# Patient Record
Sex: Female | Born: 1940 | Race: White | Hispanic: No | Smoking: Former smoker
Health system: Southern US, Community
[De-identification: ages and names within clinical notes are randomized; demographics above are authoritative.]

## PROBLEM LIST (undated history)

## (undated) DIAGNOSIS — I471 Supraventricular tachycardia, unspecified: Secondary | ICD-10-CM

## (undated) DIAGNOSIS — I447 Left bundle-branch block, unspecified: Secondary | ICD-10-CM

## (undated) DIAGNOSIS — Z923 Personal history of irradiation: Secondary | ICD-10-CM

## (undated) DIAGNOSIS — K219 Gastro-esophageal reflux disease without esophagitis: Secondary | ICD-10-CM

## (undated) DIAGNOSIS — I10 Essential (primary) hypertension: Secondary | ICD-10-CM

## (undated) DIAGNOSIS — I493 Ventricular premature depolarization: Secondary | ICD-10-CM

## (undated) DIAGNOSIS — I219 Acute myocardial infarction, unspecified: Secondary | ICD-10-CM

## (undated) DIAGNOSIS — M1990 Unspecified osteoarthritis, unspecified site: Secondary | ICD-10-CM

## (undated) DIAGNOSIS — Z9889 Other specified postprocedural states: Secondary | ICD-10-CM

## (undated) DIAGNOSIS — M549 Dorsalgia, unspecified: Secondary | ICD-10-CM

## (undated) DIAGNOSIS — M51369 Other intervertebral disc degeneration, lumbar region without mention of lumbar back pain or lower extremity pain: Secondary | ICD-10-CM

## (undated) DIAGNOSIS — R42 Dizziness and giddiness: Secondary | ICD-10-CM

## (undated) DIAGNOSIS — M5136 Other intervertebral disc degeneration, lumbar region: Secondary | ICD-10-CM

## (undated) DIAGNOSIS — R112 Nausea with vomiting, unspecified: Secondary | ICD-10-CM

## (undated) DIAGNOSIS — I5042 Chronic combined systolic (congestive) and diastolic (congestive) heart failure: Secondary | ICD-10-CM

## (undated) DIAGNOSIS — Z98811 Dental restoration status: Secondary | ICD-10-CM

## (undated) DIAGNOSIS — L409 Psoriasis, unspecified: Secondary | ICD-10-CM

## (undated) DIAGNOSIS — I4819 Other persistent atrial fibrillation: Secondary | ICD-10-CM

## (undated) DIAGNOSIS — I255 Ischemic cardiomyopathy: Secondary | ICD-10-CM

## (undated) DIAGNOSIS — I272 Pulmonary hypertension, unspecified: Secondary | ICD-10-CM

## (undated) DIAGNOSIS — M069 Rheumatoid arthritis, unspecified: Secondary | ICD-10-CM

## (undated) DIAGNOSIS — E785 Hyperlipidemia, unspecified: Secondary | ICD-10-CM

## (undated) DIAGNOSIS — I2510 Atherosclerotic heart disease of native coronary artery without angina pectoris: Secondary | ICD-10-CM

## (undated) DIAGNOSIS — J45909 Unspecified asthma, uncomplicated: Secondary | ICD-10-CM

## (undated) DIAGNOSIS — C50919 Malignant neoplasm of unspecified site of unspecified female breast: Secondary | ICD-10-CM

## (undated) DIAGNOSIS — Z7901 Long term (current) use of anticoagulants: Secondary | ICD-10-CM

## (undated) HISTORY — DX: Supraventricular tachycardia, unspecified: I47.10

## (undated) HISTORY — PX: CHOLECYSTECTOMY: SHX55

## (undated) HISTORY — PX: BREAST EXCISIONAL BIOPSY: SUR124

## (undated) HISTORY — PX: CARDIAC CATHETERIZATION: SHX172

## (undated) HISTORY — PX: HERNIA REPAIR: SHX51

## (undated) HISTORY — DX: Essential (primary) hypertension: I10

## (undated) HISTORY — DX: Rheumatoid arthritis, unspecified: M06.9

## (undated) HISTORY — PX: KNEE ARTHROSCOPY: SUR90

## (undated) HISTORY — PX: TUBAL LIGATION: SHX77

## (undated) HISTORY — PX: ABDOMINAL HYSTERECTOMY: SHX81

## (undated) HISTORY — DX: Ventricular premature depolarization: I49.3

## (undated) HISTORY — PX: NECK SURGERY: SHX720

## (undated) HISTORY — PX: BREAST LUMPECTOMY: SHX2

## (undated) HISTORY — PX: OTHER SURGICAL HISTORY: SHX169

## (undated) HISTORY — PX: LUMBAR FUSION: SHX111

## (undated) HISTORY — PX: BREAST BIOPSY: SHX20

## (undated) HISTORY — DX: Gastro-esophageal reflux disease without esophagitis: K21.9

## (undated) HISTORY — PX: TOTAL HIP ARTHROPLASTY: SHX124

## (undated) HISTORY — PX: APPENDECTOMY: SHX54

## (undated) HISTORY — DX: Other persistent atrial fibrillation: I48.19

## (undated) HISTORY — DX: Acute myocardial infarction, unspecified: I21.9

## (undated) HISTORY — DX: Ischemic cardiomyopathy: I25.5

## (undated) HISTORY — DX: Supraventricular tachycardia: I47.1

## (undated) HISTORY — DX: Psoriasis, unspecified: L40.9

## (undated) HISTORY — PX: COLONOSCOPY: SHX174

## (undated) HISTORY — DX: Atherosclerotic heart disease of native coronary artery without angina pectoris: I25.10

## (undated) HISTORY — DX: Dorsalgia, unspecified: M54.9

## (undated) HISTORY — DX: Hyperlipidemia, unspecified: E78.5

## (undated) HISTORY — DX: Unspecified osteoarthritis, unspecified site: M19.90

## (undated) HISTORY — DX: Chronic combined systolic (congestive) and diastolic (congestive) heart failure: I50.42

## (undated) HISTORY — PX: TONSILLECTOMY: SUR1361

## (undated) HISTORY — PX: TOTAL KNEE ARTHROPLASTY: SHX125

## (undated) HISTORY — PX: FOOT FRACTURE SURGERY: SHX645

---

## 1988-05-27 HISTORY — PX: CARDIAC CATHETERIZATION: SHX172

## 1989-12-22 HISTORY — PX: BREAST BIOPSY: SHX20

## 1992-12-22 DIAGNOSIS — I219 Acute myocardial infarction, unspecified: Secondary | ICD-10-CM

## 1992-12-22 HISTORY — DX: Acute myocardial infarction, unspecified: I21.9

## 1992-12-22 HISTORY — PX: CORONARY ARTERY BYPASS GRAFT: SHX141

## 1993-03-08 DIAGNOSIS — Z951 Presence of aortocoronary bypass graft: Secondary | ICD-10-CM | POA: Insufficient documentation

## 1993-04-11 HISTORY — PX: CORONARY ARTERY BYPASS GRAFT: SHX141

## 2004-04-26 ENCOUNTER — Other Ambulatory Visit: Payer: Self-pay

## 2005-03-10 ENCOUNTER — Ambulatory Visit: Payer: Self-pay | Admitting: Anesthesiology

## 2005-03-20 ENCOUNTER — Ambulatory Visit: Payer: Self-pay | Admitting: Anesthesiology

## 2005-10-09 ENCOUNTER — Ambulatory Visit: Payer: Self-pay | Admitting: Family Medicine

## 2005-10-14 ENCOUNTER — Ambulatory Visit: Payer: Self-pay | Admitting: Anesthesiology

## 2005-11-03 ENCOUNTER — Ambulatory Visit: Payer: Self-pay | Admitting: Anesthesiology

## 2005-12-04 ENCOUNTER — Ambulatory Visit: Payer: Self-pay | Admitting: Allergy

## 2005-12-22 ENCOUNTER — Emergency Department: Payer: Self-pay | Admitting: Unknown Physician Specialty

## 2006-03-12 ENCOUNTER — Ambulatory Visit: Payer: Self-pay | Admitting: Anesthesiology

## 2006-05-28 ENCOUNTER — Ambulatory Visit: Payer: Self-pay | Admitting: Anesthesiology

## 2006-10-01 ENCOUNTER — Ambulatory Visit: Payer: Self-pay | Admitting: Anesthesiology

## 2006-10-21 ENCOUNTER — Ambulatory Visit: Payer: Self-pay | Admitting: Podiatry

## 2006-10-26 ENCOUNTER — Ambulatory Visit: Payer: Self-pay | Admitting: Podiatry

## 2006-11-26 ENCOUNTER — Emergency Department: Payer: Self-pay | Admitting: Emergency Medicine

## 2006-11-26 ENCOUNTER — Other Ambulatory Visit: Payer: Self-pay

## 2006-12-30 ENCOUNTER — Ambulatory Visit: Payer: Self-pay | Admitting: Anesthesiology

## 2007-02-01 ENCOUNTER — Ambulatory Visit: Payer: Self-pay | Admitting: Anesthesiology

## 2007-02-23 ENCOUNTER — Ambulatory Visit (HOSPITAL_COMMUNITY): Admission: RE | Admit: 2007-02-23 | Discharge: 2007-02-23 | Payer: Self-pay | Admitting: *Deleted

## 2007-04-21 ENCOUNTER — Ambulatory Visit: Payer: Self-pay | Admitting: Anesthesiology

## 2007-05-20 ENCOUNTER — Ambulatory Visit: Payer: Self-pay | Admitting: Anesthesiology

## 2007-06-01 ENCOUNTER — Ambulatory Visit: Payer: Self-pay | Admitting: Family Medicine

## 2007-06-02 ENCOUNTER — Ambulatory Visit: Payer: Self-pay | Admitting: Family Medicine

## 2007-07-13 ENCOUNTER — Ambulatory Visit: Payer: Self-pay | Admitting: Neurosurgery

## 2007-07-16 ENCOUNTER — Encounter: Payer: Self-pay | Admitting: Neurosurgery

## 2007-07-23 ENCOUNTER — Encounter: Payer: Self-pay | Admitting: Neurosurgery

## 2007-07-30 ENCOUNTER — Ambulatory Visit: Payer: Self-pay | Admitting: Anesthesiology

## 2007-08-25 ENCOUNTER — Encounter: Payer: Self-pay | Admitting: Neurosurgery

## 2007-09-08 ENCOUNTER — Ambulatory Visit: Payer: Self-pay | Admitting: Anesthesiology

## 2007-09-22 ENCOUNTER — Encounter: Payer: Self-pay | Admitting: Neurosurgery

## 2007-10-19 ENCOUNTER — Ambulatory Visit: Payer: Self-pay | Admitting: Anesthesiology

## 2007-10-29 ENCOUNTER — Inpatient Hospital Stay (HOSPITAL_COMMUNITY): Admission: RE | Admit: 2007-10-29 | Discharge: 2007-10-30 | Payer: Self-pay | Admitting: Neurosurgery

## 2007-11-30 ENCOUNTER — Encounter: Admission: RE | Admit: 2007-11-30 | Discharge: 2007-11-30 | Payer: Self-pay | Admitting: Neurosurgery

## 2007-12-23 DIAGNOSIS — C50912 Malignant neoplasm of unspecified site of left female breast: Secondary | ICD-10-CM

## 2007-12-23 DIAGNOSIS — C50919 Malignant neoplasm of unspecified site of unspecified female breast: Secondary | ICD-10-CM

## 2007-12-23 HISTORY — DX: Malignant neoplasm of unspecified site of left female breast: C50.912

## 2007-12-23 HISTORY — PX: BREAST LUMPECTOMY: SHX2

## 2007-12-23 HISTORY — DX: Malignant neoplasm of unspecified site of unspecified female breast: C50.919

## 2007-12-23 HISTORY — PX: BREAST EXCISIONAL BIOPSY: SUR124

## 2008-02-01 ENCOUNTER — Ambulatory Visit: Payer: Self-pay | Admitting: Neurosurgery

## 2008-04-10 ENCOUNTER — Ambulatory Visit: Payer: Self-pay | Admitting: Gastroenterology

## 2008-04-10 LAB — HM COLONOSCOPY

## 2008-04-13 ENCOUNTER — Ambulatory Visit: Payer: Self-pay | Admitting: Neurosurgery

## 2008-05-26 ENCOUNTER — Ambulatory Visit: Payer: Self-pay | Admitting: Unknown Physician Specialty

## 2008-08-16 ENCOUNTER — Ambulatory Visit: Payer: Self-pay | Admitting: Family Medicine

## 2008-08-30 ENCOUNTER — Ambulatory Visit: Payer: Self-pay | Admitting: Family Medicine

## 2008-09-08 ENCOUNTER — Ambulatory Visit: Payer: Self-pay | Admitting: Internal Medicine

## 2008-09-08 ENCOUNTER — Ambulatory Visit: Payer: Self-pay | Admitting: Surgery

## 2008-09-08 ENCOUNTER — Other Ambulatory Visit: Payer: Self-pay

## 2008-09-12 ENCOUNTER — Ambulatory Visit: Payer: Self-pay | Admitting: Surgery

## 2008-09-21 ENCOUNTER — Ambulatory Visit: Payer: Self-pay | Admitting: Internal Medicine

## 2008-10-02 ENCOUNTER — Ambulatory Visit: Payer: Self-pay | Admitting: Surgery

## 2008-10-22 ENCOUNTER — Ambulatory Visit: Payer: Self-pay | Admitting: Internal Medicine

## 2008-11-06 ENCOUNTER — Ambulatory Visit: Payer: Self-pay | Admitting: Neurosurgery

## 2008-11-09 ENCOUNTER — Ambulatory Visit: Payer: Self-pay | Admitting: Internal Medicine

## 2008-11-21 ENCOUNTER — Ambulatory Visit: Payer: Self-pay | Admitting: Internal Medicine

## 2008-11-24 ENCOUNTER — Ambulatory Visit: Payer: Self-pay | Admitting: Anesthesiology

## 2008-12-22 ENCOUNTER — Ambulatory Visit: Payer: Self-pay | Admitting: Internal Medicine

## 2009-01-12 ENCOUNTER — Ambulatory Visit: Payer: Self-pay | Admitting: Family Medicine

## 2009-01-22 ENCOUNTER — Ambulatory Visit: Payer: Self-pay | Admitting: Internal Medicine

## 2009-02-19 ENCOUNTER — Ambulatory Visit: Payer: Self-pay | Admitting: Internal Medicine

## 2009-03-22 ENCOUNTER — Ambulatory Visit: Payer: Self-pay | Admitting: Internal Medicine

## 2009-03-30 ENCOUNTER — Ambulatory Visit: Payer: Self-pay | Admitting: Internal Medicine

## 2009-04-21 ENCOUNTER — Ambulatory Visit: Payer: Self-pay | Admitting: Internal Medicine

## 2009-06-21 ENCOUNTER — Ambulatory Visit: Payer: Self-pay | Admitting: Internal Medicine

## 2009-07-02 ENCOUNTER — Ambulatory Visit: Payer: Self-pay | Admitting: Internal Medicine

## 2009-07-22 ENCOUNTER — Ambulatory Visit: Payer: Self-pay | Admitting: Internal Medicine

## 2009-07-30 ENCOUNTER — Ambulatory Visit: Payer: Self-pay | Admitting: Anesthesiology

## 2009-08-20 ENCOUNTER — Ambulatory Visit: Payer: Self-pay | Admitting: Internal Medicine

## 2009-08-22 ENCOUNTER — Ambulatory Visit: Payer: Self-pay | Admitting: Internal Medicine

## 2009-10-22 ENCOUNTER — Ambulatory Visit: Payer: Self-pay | Admitting: Internal Medicine

## 2009-10-30 ENCOUNTER — Ambulatory Visit: Payer: Self-pay | Admitting: Internal Medicine

## 2009-11-21 ENCOUNTER — Ambulatory Visit: Payer: Self-pay | Admitting: Internal Medicine

## 2009-12-03 ENCOUNTER — Ambulatory Visit: Payer: Self-pay | Admitting: Anesthesiology

## 2010-02-08 ENCOUNTER — Ambulatory Visit: Payer: Self-pay | Admitting: Anesthesiology

## 2010-02-19 ENCOUNTER — Ambulatory Visit: Payer: Self-pay | Admitting: Internal Medicine

## 2010-02-22 ENCOUNTER — Ambulatory Visit: Payer: Self-pay | Admitting: Internal Medicine

## 2010-03-22 ENCOUNTER — Ambulatory Visit: Payer: Self-pay | Admitting: Internal Medicine

## 2010-08-21 ENCOUNTER — Ambulatory Visit: Payer: Self-pay | Admitting: Internal Medicine

## 2010-08-22 ENCOUNTER — Ambulatory Visit: Payer: Self-pay | Admitting: Internal Medicine

## 2010-09-16 ENCOUNTER — Ambulatory Visit: Payer: Self-pay | Admitting: Internal Medicine

## 2010-09-18 ENCOUNTER — Ambulatory Visit: Payer: Self-pay

## 2010-09-21 ENCOUNTER — Ambulatory Visit: Payer: Self-pay | Admitting: Internal Medicine

## 2011-01-22 ENCOUNTER — Ambulatory Visit: Payer: Self-pay | Admitting: Internal Medicine

## 2011-02-20 ENCOUNTER — Ambulatory Visit: Payer: Self-pay | Admitting: Internal Medicine

## 2011-05-06 NOTE — Op Note (Signed)
Teresa [name], Teresa [name]               ACCOUNT NO.:  [id number]   MEDICAL RECORD NO.:  [id number]          PATIENT TYPE:  INP   LOCATION:  3172                         FACILITY:  [facility]   PHYSICIAN:  Kathaleen Maser. Pool, M.D.    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   POSTOPERATIVE DIAGNOSIS:  L3-4 and L4-5 grade 1 degenerative  spondylolisthesis with stenosis.   PROCEDURE NOTE:  L3-4 and L4-5 decompressive laminectomy with L3, L4 and  L5 foraminotomies, bilaterally, more than would be required for simple  interbody fusion alone.  L3-4 and L4-5 posterior lumbar interbody fusion  utilizing Tangent interbody allograft wedge, Telamon interbody PEEK cage  and local autografting.  L3 through L5 posterolateral arthrodesis  utilizing segmental pedicle screw sedation and local autograft.   SURGEON:  Kathaleen Maser. Pool, M.D.   ASSISTANT:  Donalee Citrin, M.D.   ANESTHESIA:  General endotracheal.   INDICATIONS:  Teresa [name] is a 70 year old female with history of severe  back and lower extremity pain as well as neurogenic claudication failing  conservative management.  Workup demonstrates evidence of marked  multilevel disk degeneration, worse at the L3-4 and L4-5 levels with  resultant general spondylolisthesis at both levels and significant  stenosis.  The patient counseled as to her options.  She decided proceed  with an L3-4 and L4-5 decompression and fusion with instrumentation in  hopes of relieving her symptoms.   OPERATIVE NOTE:  The patient was placed on operating table in supine  position.  Adequate level of anesthesia was achieved.  The patient was  positioned prone on the [name] frame, appropriately padded.  The  patient's lumbar region was prepped and draped sterilely.  A 10 blade  was used to make a linear  incision overlying the L2, 3, 4, and 5 levels.  This was carried down sharply in the midline.  A subperiosteal  dissection was then performed exposing the lamina and facet joints L3,  L4 and L5 as well as transverse processes of aforementioned levels.  Deep self-retaining was placed.  Intraoperative fluoroscopy was used.  Levels were confirmed.  Decompressive laminectomy was then performed  using Leksell rongeurs, Kerrison rongeurs, high-speed drill to remove  the entire lamina of L3 and the entire lamina of L4 and the superior  aspect of lamina at L5.  Inferior facetectomy was performed bilaterally  at L3 and L4.  Superior facetectomies were performed bilaterally at L4  and L5.  All bone was cleaned and used in later autograft.  Ligament  flavum was elevated and resected in piecemeal fashion using Kerrison  rongeurs.  Wide decompressive foraminotomies were then performed along  the course of exiting L3, 4 and 5 nerve roots.  Epidural venous plexus  was coagulated and cut,  starting first on the patient's left side,  thecal sac and nerve roots gently mobilized and retracted towards the  midline.  Disk space then incised with a 15 blade in  rectangular  fashion.  Wide disk space clean out was achieved using pituitary  rongeurs, upward angled pituitary rongeurs and Epstein curettes.  The  procedure was repeated on the contralateral side.  Disk space then  sequentially dilated up to 8 mm with an 8-mm distractor left on the  patient's left side.  Thecal sac and nerve root was protected on the  right side.  Disk space then reamed and cut with 8-mm Tangent  instruments.  Soft tissues were removed from the interspace.  A 8 x 26-  mm Telamon cage packed with morselized autograft, and Progenix putty was  then packed into place and recessed less than 1 mm from the posterior  cortical margin of L3.  Distraction was moved to the patient's left  side.  Thecal sac and nerve root was protected on the left  side.  Disk  space once again reamed and then cut using 8-mm instruments.  Soft  tissues was removed from the interspace.  Disk space further curettaged.  Morselized autograft was packed within the interspace.  An 8 x 26-mm  tangent wedge was then impacted into place and recessed approximately 1  mm from the posterior cortical margin of L3.  Procedure repeated at L4-5  again without complication, again using 8-mm implants.  The pedicles of  L3, 4 and 5 were then identified using surface landmarks and  intraoperative fluoroscopy.  Superficial bone around the pedicle was  then removed using the high-speed drill.  Each pedicle was then probed  using pedicle awl.  Each pedicle awl track was then probed and found to  be solidly within bone.  Each pedicle awl track was then tapped and a  5.25-mm screw tapper used.  Screw tap hole was probed and found to be  solid within bone.  The 6.75 x 45-mm radius screws placed bilaterally at  L3 and L4, 6.75 x 40-mm screws placed bilaterally at L5.  Transverse  processes at L3, 4 and 5 were then decorticated using high speed drill.  Morselized autograft was packed posterolaterally for later fusion.  Short segment titanium rod was then placed through the screw heads at  L3, 4 and 5.  Locking caps were then placed over screw heads.  Locking  caps were then engaged with construct under compression.  A transverse  connector was placed.  Final images revealed good position of bone  grafts and hardware and proper operative level and normal alignment of  the spine.  Wound was then irrigated with antibiotic solution.  Hemostasis was ensured.  Gelfoam was placed topically in the epidural  space.  A medium Hemovac drain was left in the epidural space.  Wound  was then closed in layers with Vicryl sutures.  Steri-Strips and sterile  dressing were applied.  There were no complications.  The patient  tolerated the procedure well, and she returned to recovery for   postoperative care.           ______________________________  Kathaleen Maser. Pool, M.D.     HAP/MEDQ  D:  10/29/2007  T:  10/30/2007  Job:  [id number]

## 2011-05-09 NOTE — Cardiovascular Report (Signed)
Teresa [name], Teresa [name]               ACCOUNT NO.:  [id number]   MEDICAL RECORD NO.:  [id number]          PATIENT TYPE:  OIB   LOCATION:  2899                         FACILITY:  [facility]   PHYSICIAN:  Darlin Priestly, MD  DATE OF BIRTH:  06-26-1941   DATE OF PROCEDURE:  02/23/2007  DATE OF DISCHARGE:                            CARDIAC CATHETERIZATION   PROCEDURES:  1. Left heart catheterization.  2. Coronary angiogram.  3. Left ventriculogram.  4. Left internal mammary angiography.   COMPLICATIONS:  None.   INDICATIONS:  Teresa [name] is a 70 year old female patient of Dr. Franne Forts in [place] as well as [name], ANP-C, with a history of  coronary bypass surgery at [facility] in [facility] consisting of a LIMA to the LAD.  She has had no cardiology followup since that time, was recently seen by  [name] with the complaint of an intermittent palpitations.  She did  undergo a Cardiolite stress test suggesting anterior apical ischemia  with a normal LV function.  She is now brought for a cardiac  catheterization to reevaluate her CAD and IMA.   DESCRIPTION OF OPERATION:  After obtaining informed consent, the patient  was brought to the cardiac cath lab.  The right and left groin was  shaved, An ECG monitor was established.  Using the modified Seldinger  technique, a #6 French arterial sheath in the right femoral artery.  A 6-  [place] diagnostic catheter was used to perform using diagnostic  angiography.   The left main is a large vessel with no significant disease.   The LAD is a large-sized vessel which coursed through the apex and gives  rise to two diagonal branches.  There is 90% ostial LAD lesion which  appears hazy.  There is a  atretic but patent LIMA which inserts into the mid portion of the LAD.  This retrograde fills with LAD injections as well as a very faint  competitive flow in the IMA with IMA injections.  Again, it appears  atretic but patent.  The remainder of the  LAD has no significant  disease.   The first and second diagonals are medium-sized vessels with no  significant disease.   The left coronary artery also gives rise to a small-to-medium-sized  ramus intermedius with no significant disease.   The left circumflex is a medium-sized vessel coursing to the AV groove  and gives rise to two obtuse marginal branches.  The AV circumflex has  no significant disease.   The first OM is a medium-sized vessel which bifurcates distally with no  significant disease.   The second OM is a small vessel with no significant disease.   The right coronary artery is a medium-sized vessel which is dominant and  gives rise to a PDA and posterolateral branch.  There is no significant  disease in the RCA, PDA, and posterolateral branch.   The left ventricle reveals a preserved EF at 60%.   IMA injection reveals atretic but patent IMA, inserts in the mid portion  of the LAD.   Hemodynamic systemic arterial pressure 137/70, LV systemic system  pressure of 137/9, LVEDP of 19.   CONCLUSIONS:  1. Significant one-vessel coronary artery disease.  2. Patent but atretic left internal mammary artery into the mid      portion of the left anterior descending.  There is a very faint      competitive flow into the distal internal mammary artery.  3. Normal LV systolic function.  4. Elevated left ventricular end diastolic pressure.      Darlin Priestly, MD  Electronically Signed     RHM/MEDQ  D:  02/23/2007  T:  02/23/2007  Job:  [id number]   cc:   Fuller Canada, ANP-C

## 2011-08-26 ENCOUNTER — Ambulatory Visit: Payer: Self-pay | Admitting: Internal Medicine

## 2011-08-29 ENCOUNTER — Ambulatory Visit: Payer: Self-pay | Admitting: Internal Medicine

## 2011-09-22 ENCOUNTER — Ambulatory Visit: Payer: Self-pay | Admitting: Internal Medicine

## 2011-09-30 LAB — BASIC METABOLIC PANEL
BUN: 14
CO2: 30
Calcium: 9.4
Chloride: 103
Creatinine, Ser: 0.71
GFR calc Af Amer: >60
GFR calc non Af Amer: >60
Glucose, Bld: 87
Potassium: 4.3
Sodium: 139

## 2011-09-30 LAB — DIFFERENTIAL
Basophils Absolute: 0
Basophils Relative: 0
Eosinophils Absolute: 0.1
Eosinophils Relative: 1
Lymphocytes Relative: 20
Lymphs Abs: 2.2
Monocytes Absolute: 0.9 — ABNORMAL HIGH
Monocytes Relative: 8
Neutro Abs: 7.5
Neutrophils Relative %: 70

## 2011-09-30 LAB — HEPATIC FUNCTION PANEL
ALT: 13
AST: 14
Albumin: 3.6
Alkaline Phosphatase: 81
Bilirubin, Direct: 0.1
Indirect Bilirubin: 0.6
Total Bilirubin: 0.7
Total Protein: 6.4

## 2011-09-30 LAB — CBC
HCT: 48.5 — ABNORMAL HIGH
Hemoglobin: 16.3 — ABNORMAL HIGH
MCHC: 33.5
MCV: 93.1
Platelets: 319
RBC: 5.22 — ABNORMAL HIGH
RDW: 13.6
WBC: 10.8 — ABNORMAL HIGH

## 2011-09-30 LAB — TYPE AND SCREEN
ABO/RH(D): A POS
Antibody Screen: NEGATIVE

## 2011-09-30 LAB — ABO/RH: ABO/RH(D): A POS

## 2011-12-10 ENCOUNTER — Ambulatory Visit: Payer: Self-pay | Admitting: Anesthesiology

## 2012-01-05 ENCOUNTER — Ambulatory Visit: Payer: Self-pay | Admitting: Anesthesiology

## 2012-02-09 ENCOUNTER — Ambulatory Visit: Payer: Self-pay | Admitting: General Practice

## 2012-02-09 LAB — CBC
HCT: 46 % (ref 35.0–47.0)
HGB: 15.3 g/dL (ref 12.0–16.0)
MCH: 30.3 pg (ref 26.0–34.0)
MCHC: 33.1 g/dL (ref 32.0–36.0)
MCV: 91 fL (ref 80–100)
Platelet: 221 10*3/uL (ref 150–440)
RBC: 5.04 10*6/uL (ref 3.80–5.20)
RDW: 14.3 % (ref 11.5–14.5)
WBC: 5.2 10*3/uL (ref 3.6–11.0)

## 2012-02-09 LAB — BASIC METABOLIC PANEL
Anion Gap: 6 — ABNORMAL LOW (ref 7–16)
BUN: 14 mg/dL (ref 7–18)
Calcium, Total: 9.1 mg/dL (ref 8.5–10.1)
Chloride: 106 mmol/L (ref 98–107)
Co2: 28 mmol/L (ref 21–32)
Creatinine: 0.81 mg/dL (ref 0.60–1.30)
EGFR (African American): >60
EGFR (Non-African Amer.): >60
Glucose: 136 mg/dL — ABNORMAL HIGH (ref 65–99)
Osmolality: 282 (ref 275–301)
Potassium: 3.8 mmol/L (ref 3.5–5.1)
Sodium: 140 mmol/L (ref 136–145)

## 2012-02-09 LAB — URINALYSIS, COMPLETE
Bilirubin,UR: NEGATIVE
Blood: NEGATIVE
Glucose,UR: NEGATIVE mg/dL (ref 0–75)
Nitrite: NEGATIVE
Ph: 5 (ref 4.5–8.0)
Protein: NEGATIVE
RBC,UR: 2 /HPF (ref 0–5)
Specific Gravity: 1.017 (ref 1.003–1.030)
Squamous Epithelial: 5
WBC UR: 6 /HPF (ref 0–5)

## 2012-02-09 LAB — SEDIMENTATION RATE: Erythrocyte Sed Rate: 1 mm/hr (ref 0–30)

## 2012-02-09 LAB — APTT: Activated PTT: 34.1 secs (ref 23.6–35.9)

## 2012-02-09 LAB — PROTIME-INR
INR: 1.1
Prothrombin Time: 14.3 secs (ref 11.5–14.7)

## 2012-02-09 LAB — MRSA PCR SCREENING

## 2012-02-10 LAB — URINE CULTURE

## 2012-02-25 ENCOUNTER — Inpatient Hospital Stay: Payer: Self-pay | Admitting: General Practice

## 2012-02-26 LAB — BASIC METABOLIC PANEL
Anion Gap: 9 (ref 7–16)
BUN: 14 mg/dL (ref 7–18)
Calcium, Total: 8 mg/dL — ABNORMAL LOW (ref 8.5–10.1)
Chloride: 106 mmol/L (ref 98–107)
Co2: 28 mmol/L (ref 21–32)
Creatinine: 0.72 mg/dL (ref 0.60–1.30)
EGFR (African American): >60
EGFR (Non-African Amer.): >60
Glucose: 132 mg/dL — ABNORMAL HIGH (ref 65–99)
Osmolality: 287 (ref 275–301)
Potassium: 4.9 mmol/L (ref 3.5–5.1)
Sodium: 143 mmol/L (ref 136–145)

## 2012-02-26 LAB — PLATELET COUNT: Platelet: 188 10*3/uL (ref 150–440)

## 2012-02-26 LAB — HEMOGLOBIN: HGB: 11.5 g/dL — ABNORMAL LOW (ref 12.0–16.0)

## 2012-02-27 LAB — PLATELET COUNT: Platelet: 153 10*3/uL (ref 150–440)

## 2012-02-27 LAB — BASIC METABOLIC PANEL
Anion Gap: 11 (ref 7–16)
BUN: 10 mg/dL (ref 7–18)
Calcium, Total: 7.6 mg/dL — ABNORMAL LOW (ref 8.5–10.1)
Chloride: 109 mmol/L — ABNORMAL HIGH (ref 98–107)
Co2: 27 mmol/L (ref 21–32)
Creatinine: 0.61 mg/dL (ref 0.60–1.30)
EGFR (African American): >60
EGFR (Non-African Amer.): >60
Glucose: 121 mg/dL — ABNORMAL HIGH (ref 65–99)
Osmolality: 293 (ref 275–301)
Potassium: 4 mmol/L (ref 3.5–5.1)
Sodium: 147 mmol/L — ABNORMAL HIGH (ref 136–145)

## 2012-02-27 LAB — PATHOLOGY REPORT

## 2012-02-27 LAB — HEMOGLOBIN: HGB: 9.8 g/dL — ABNORMAL LOW (ref 12.0–16.0)

## 2012-02-29 ENCOUNTER — Encounter: Payer: Self-pay | Admitting: Internal Medicine

## 2012-03-22 ENCOUNTER — Encounter: Payer: Self-pay | Admitting: Internal Medicine

## 2012-04-21 ENCOUNTER — Encounter: Payer: Self-pay | Admitting: Internal Medicine

## 2012-04-28 ENCOUNTER — Ambulatory Visit: Payer: Self-pay | Admitting: Internal Medicine

## 2012-04-28 LAB — HEPATIC FUNCTION PANEL A (ARMC)
Albumin: 3.8 g/dL (ref 3.4–5.0)
Alkaline Phosphatase: 101 U/L (ref 50–136)
Bilirubin, Direct: 0.1 mg/dL (ref 0.00–0.20)
Bilirubin,Total: 0.2 mg/dL (ref 0.2–1.0)
SGOT(AST): 16 U/L (ref 15–37)
SGPT (ALT): 22 U/L
Total Protein: 7.3 g/dL (ref 6.4–8.2)

## 2012-04-28 LAB — CBC CANCER CENTER
Basophil #: 0 x10 3/mm (ref 0.0–0.1)
Basophil %: 0.8 %
Eosinophil #: 0 x10 3/mm (ref 0.0–0.7)
Eosinophil %: 0.1 %
HCT: 42.1 % (ref 35.0–47.0)
HGB: 13.1 g/dL (ref 12.0–16.0)
Lymphocyte #: 2 x10 3/mm (ref 1.0–3.6)
Lymphocyte %: 33.4 %
MCH: 27.8 pg (ref 26.0–34.0)
MCHC: 31.2 g/dL — ABNORMAL LOW (ref 32.0–36.0)
MCV: 89 fL (ref 80–100)
Monocyte #: 0.7 x10 3/mm (ref 0.2–0.9)
Monocyte %: 10.9 %
Neutrophil #: 3.3 x10 3/mm (ref 1.4–6.5)
Neutrophil %: 54.8 %
Platelet: 250 x10 3/mm (ref 150–440)
RBC: 4.73 10*6/uL (ref 3.80–5.20)
RDW: 16.2 % — ABNORMAL HIGH (ref 11.5–14.5)
WBC: 6.1 x10 3/mm (ref 3.6–11.0)

## 2012-04-28 LAB — CREATININE, SERUM
Creatinine: 0.67 mg/dL (ref 0.60–1.30)
EGFR (African American): >60
EGFR (Non-African Amer.): >60

## 2012-05-12 ENCOUNTER — Ambulatory Visit: Payer: Self-pay | Admitting: Family Medicine

## 2012-05-22 ENCOUNTER — Ambulatory Visit: Payer: Self-pay | Admitting: Internal Medicine

## 2012-06-14 ENCOUNTER — Ambulatory Visit: Payer: Self-pay | Admitting: Family Medicine

## 2012-06-25 ENCOUNTER — Ambulatory Visit: Payer: Self-pay | Admitting: Internal Medicine

## 2012-07-22 ENCOUNTER — Ambulatory Visit: Payer: Self-pay | Admitting: Internal Medicine

## 2012-09-08 ENCOUNTER — Ambulatory Visit: Payer: Self-pay | Admitting: Internal Medicine

## 2012-10-29 ENCOUNTER — Emergency Department: Payer: Self-pay | Admitting: *Deleted

## 2013-01-03 ENCOUNTER — Ambulatory Visit: Payer: Self-pay | Admitting: Family Medicine

## 2013-02-11 ENCOUNTER — Ambulatory Visit: Payer: Self-pay

## 2013-04-06 ENCOUNTER — Emergency Department: Payer: Self-pay | Admitting: Unknown Physician Specialty

## 2013-04-06 LAB — CBC
HCT: 44 % (ref 35.0–47.0)
HGB: 14.5 g/dL (ref 12.0–16.0)
MCH: 30.2 pg (ref 26.0–34.0)
MCHC: 32.9 g/dL (ref 32.0–36.0)
MCV: 92 fL (ref 80–100)
Platelet: 245 10*3/uL (ref 150–440)
RBC: 4.79 10*6/uL (ref 3.80–5.20)
RDW: 13.8 % (ref 11.5–14.5)
WBC: 6.7 10*3/uL (ref 3.6–11.0)

## 2013-04-06 LAB — PROTIME-INR
INR: 1
Prothrombin Time: 13.8 secs (ref 11.5–14.7)

## 2013-04-06 LAB — HEPATIC FUNCTION PANEL A (ARMC)
Albumin: 3.8 g/dL (ref 3.4–5.0)
Alkaline Phosphatase: 108 U/L (ref 50–136)
Bilirubin, Direct: 0.1 mg/dL (ref 0.00–0.20)
Bilirubin,Total: 0.4 mg/dL (ref 0.2–1.0)
SGOT(AST): 27 U/L (ref 15–37)
SGPT (ALT): 24 U/L (ref 12–78)
Total Protein: 7.2 g/dL (ref 6.4–8.2)

## 2013-04-06 LAB — TROPONIN I
Troponin-I: <0.02 ng/mL
Troponin-I: <0.02 ng/mL

## 2013-04-06 LAB — BASIC METABOLIC PANEL
Anion Gap: 7 (ref 7–16)
BUN: 17 mg/dL (ref 7–18)
Calcium, Total: 8.7 mg/dL (ref 8.5–10.1)
Chloride: 106 mmol/L (ref 98–107)
Co2: 26 mmol/L (ref 21–32)
Creatinine: 0.7 mg/dL (ref 0.60–1.30)
Glucose: 87 mg/dL (ref 65–99)
Osmolality: 278 (ref 275–301)
Potassium: 3.9 mmol/L (ref 3.5–5.1)
Sodium: 139 mmol/L (ref 136–145)

## 2013-04-06 LAB — MAGNESIUM: Magnesium: 2 mg/dL

## 2013-04-06 LAB — APTT: Activated PTT: 32.8 secs (ref 23.6–35.9)

## 2013-05-02 ENCOUNTER — Ambulatory Visit: Payer: Self-pay | Admitting: Internal Medicine

## 2013-05-11 LAB — CBC CANCER CENTER
Basophil #: 0.1 x10 3/mm (ref 0.0–0.1)
Basophil %: 1.3 %
Eosinophil #: 0.3 x10 3/mm (ref 0.0–0.7)
Eosinophil %: 4.3 %
HCT: 44.5 % (ref 35.0–47.0)
HGB: 14.6 g/dL (ref 12.0–16.0)
Lymphocyte #: 2 x10 3/mm (ref 1.0–3.6)
Lymphocyte %: 28.7 %
MCH: 30 pg (ref 26.0–34.0)
MCHC: 32.8 g/dL (ref 32.0–36.0)
MCV: 92 fL (ref 80–100)
Monocyte #: 0.8 x10 3/mm (ref 0.2–0.9)
Monocyte %: 11 %
Neutrophil #: 3.7 x10 3/mm (ref 1.4–6.5)
Neutrophil %: 54.7 %
Platelet: 225 x10 3/mm (ref 150–440)
RBC: 4.86 10*6/uL (ref 3.80–5.20)
RDW: 14.4 % (ref 11.5–14.5)
WBC: 6.8 x10 3/mm (ref 3.6–11.0)

## 2013-05-11 LAB — CREATININE, SERUM
Creatinine: 0.86 mg/dL (ref 0.60–1.30)
EGFR (African American): >60
EGFR (Non-African Amer.): >60

## 2013-05-11 LAB — HEPATIC FUNCTION PANEL A (ARMC)
Albumin: 3.6 g/dL (ref 3.4–5.0)
Alkaline Phosphatase: 113 U/L (ref 50–136)
Bilirubin, Direct: <0.05 mg/dL (ref 0.00–0.20)
Bilirubin,Total: 0.3 mg/dL (ref 0.2–1.0)
SGOT(AST): 23 U/L (ref 15–37)
SGPT (ALT): 24 U/L (ref 12–78)
Total Protein: 7 g/dL (ref 6.4–8.2)

## 2013-05-22 ENCOUNTER — Ambulatory Visit: Payer: Self-pay | Admitting: Internal Medicine

## 2013-09-23 ENCOUNTER — Ambulatory Visit: Payer: Self-pay | Admitting: Internal Medicine

## 2013-11-23 ENCOUNTER — Ambulatory Visit: Payer: Self-pay | Admitting: Internal Medicine

## 2013-11-28 ENCOUNTER — Ambulatory Visit: Payer: Self-pay | Admitting: Family Medicine

## 2013-12-05 ENCOUNTER — Ambulatory Visit: Payer: Self-pay | Admitting: Family Medicine

## 2013-12-06 ENCOUNTER — Ambulatory Visit: Payer: Self-pay | Admitting: General Practice

## 2013-12-06 LAB — BASIC METABOLIC PANEL
Anion Gap: 0 — ABNORMAL LOW (ref 7–16)
BUN: 13 mg/dL (ref 7–18)
Calcium, Total: 9.8 mg/dL (ref 8.5–10.1)
Chloride: 103 mmol/L (ref 98–107)
Co2: 34 mmol/L — ABNORMAL HIGH (ref 21–32)
Creatinine: 0.7 mg/dL (ref 0.60–1.30)
EGFR (African American): >60
EGFR (Non-African Amer.): >60
Glucose: 96 mg/dL (ref 65–99)
Osmolality: 274 (ref 275–301)
Potassium: 3.8 mmol/L (ref 3.5–5.1)
Sodium: 137 mmol/L (ref 136–145)

## 2013-12-06 LAB — URINALYSIS, COMPLETE
Bacteria: NONE SEEN
Bilirubin,UR: NEGATIVE
Blood: NEGATIVE
Glucose,UR: NEGATIVE mg/dL (ref 0–75)
Ketone: NEGATIVE
Leukocyte Esterase: NEGATIVE
Nitrite: NEGATIVE
Ph: 7 (ref 4.5–8.0)
Protein: NEGATIVE
RBC,UR: <1 /HPF (ref 0–5)
Specific Gravity: 1.006 (ref 1.003–1.030)
Squamous Epithelial: NONE SEEN
WBC UR: <1 /HPF (ref 0–5)

## 2013-12-06 LAB — CBC
HCT: 47.5 % — ABNORMAL HIGH (ref 35.0–47.0)
HGB: 16 g/dL (ref 12.0–16.0)
MCH: 30.8 pg (ref 26.0–34.0)
MCHC: 33.6 g/dL (ref 32.0–36.0)
MCV: 92 fL (ref 80–100)
Platelet: 277 10*3/uL (ref 150–440)
RBC: 5.18 10*6/uL (ref 3.80–5.20)
RDW: 14.3 % (ref 11.5–14.5)
WBC: 6.5 10*3/uL (ref 3.6–11.0)

## 2013-12-06 LAB — APTT: Activated PTT: 30.9 secs (ref 23.6–35.9)

## 2013-12-06 LAB — PROTIME-INR
INR: 0.9
Prothrombin Time: 12.7 secs (ref 11.5–14.7)

## 2013-12-06 LAB — SEDIMENTATION RATE: Erythrocyte Sed Rate: 5 mm/hr (ref 0–30)

## 2013-12-06 LAB — MRSA PCR SCREENING

## 2013-12-07 LAB — URINE CULTURE

## 2013-12-19 ENCOUNTER — Inpatient Hospital Stay: Payer: Self-pay | Admitting: General Practice

## 2013-12-19 HISTORY — PX: TOTAL KNEE ARTHROPLASTY: SHX125

## 2013-12-20 LAB — BASIC METABOLIC PANEL
Anion Gap: 5 — ABNORMAL LOW (ref 7–16)
BUN: 10 mg/dL (ref 7–18)
Calcium, Total: 8 mg/dL — ABNORMAL LOW (ref 8.5–10.1)
Chloride: 107 mmol/L (ref 98–107)
Co2: 27 mmol/L (ref 21–32)
Creatinine: 0.62 mg/dL (ref 0.60–1.30)
EGFR (African American): >60
EGFR (Non-African Amer.): >60
Glucose: 99 mg/dL (ref 65–99)
Osmolality: 277 (ref 275–301)
Potassium: 4.2 mmol/L (ref 3.5–5.1)
Sodium: 139 mmol/L (ref 136–145)

## 2013-12-20 LAB — PLATELET COUNT: Platelet: 191 10*3/uL (ref 150–440)

## 2013-12-20 LAB — HEMOGLOBIN: HGB: 13.1 g/dL (ref 12.0–16.0)

## 2013-12-21 LAB — BASIC METABOLIC PANEL
BUN: 8 mg/dL (ref 7–18)
Calcium, Total: 8.5 mg/dL (ref 8.5–10.1)
Chloride: 105 mmol/L (ref 98–107)
Co2: 34 mmol/L — ABNORMAL HIGH (ref 21–32)
Creatinine: 0.61 mg/dL (ref 0.60–1.30)
EGFR (African American): >60
EGFR (Non-African Amer.): >60
Glucose: 132 mg/dL — ABNORMAL HIGH (ref 65–99)
Osmolality: 276 (ref 275–301)
Potassium: 4.5 mmol/L (ref 3.5–5.1)
Sodium: 138 mmol/L (ref 136–145)

## 2013-12-21 LAB — PLATELET COUNT: Platelet: 187 10*3/uL (ref 150–440)

## 2013-12-21 LAB — HEMOGLOBIN: HGB: 12.7 g/dL (ref 12.0–16.0)

## 2013-12-21 LAB — URIC ACID: Uric Acid: 3.5 mg/dL (ref 2.6–6.0)

## 2013-12-22 ENCOUNTER — Ambulatory Visit: Payer: Self-pay | Admitting: Internal Medicine

## 2014-01-10 ENCOUNTER — Encounter: Payer: Self-pay | Admitting: General Practice

## 2014-01-11 ENCOUNTER — Ambulatory Visit: Payer: Self-pay | Admitting: Neurology

## 2014-03-08 DIAGNOSIS — K219 Gastro-esophageal reflux disease without esophagitis: Secondary | ICD-10-CM | POA: Insufficient documentation

## 2014-03-08 DIAGNOSIS — C50919 Malignant neoplasm of unspecified site of unspecified female breast: Secondary | ICD-10-CM | POA: Insufficient documentation

## 2014-03-31 ENCOUNTER — Ambulatory Visit: Payer: Self-pay | Admitting: Family Medicine

## 2014-06-26 ENCOUNTER — Ambulatory Visit: Payer: Self-pay | Admitting: Ophthalmology

## 2014-06-28 DIAGNOSIS — I2510 Atherosclerotic heart disease of native coronary artery without angina pectoris: Secondary | ICD-10-CM | POA: Insufficient documentation

## 2014-06-28 DIAGNOSIS — C50919 Malignant neoplasm of unspecified site of unspecified female breast: Secondary | ICD-10-CM | POA: Insufficient documentation

## 2014-06-28 DIAGNOSIS — M1990 Unspecified osteoarthritis, unspecified site: Secondary | ICD-10-CM | POA: Insufficient documentation

## 2014-07-03 ENCOUNTER — Ambulatory Visit: Payer: Self-pay | Admitting: Ophthalmology

## 2014-07-03 DIAGNOSIS — M5417 Radiculopathy, lumbosacral region: Secondary | ICD-10-CM | POA: Insufficient documentation

## 2014-09-11 ENCOUNTER — Ambulatory Visit: Payer: Self-pay | Admitting: Ophthalmology

## 2014-10-10 ENCOUNTER — Ambulatory Visit: Payer: Self-pay | Admitting: Family Medicine

## 2014-12-26 DIAGNOSIS — M5116 Intervertebral disc disorders with radiculopathy, lumbar region: Secondary | ICD-10-CM | POA: Insufficient documentation

## 2015-01-04 ENCOUNTER — Ambulatory Visit: Payer: Self-pay | Admitting: Family Medicine

## 2015-01-18 DIAGNOSIS — M5136 Other intervertebral disc degeneration, lumbar region: Secondary | ICD-10-CM | POA: Insufficient documentation

## 2015-01-30 DIAGNOSIS — R55 Syncope and collapse: Secondary | ICD-10-CM | POA: Diagnosis not present

## 2015-01-30 DIAGNOSIS — I493 Ventricular premature depolarization: Secondary | ICD-10-CM | POA: Diagnosis not present

## 2015-02-26 ENCOUNTER — Encounter: Payer: Self-pay | Admitting: *Deleted

## 2015-02-27 ENCOUNTER — Encounter: Payer: Self-pay | Admitting: *Deleted

## 2015-02-27 ENCOUNTER — Ambulatory Visit (INDEPENDENT_AMBULATORY_CARE_PROVIDER_SITE_OTHER): Payer: PPO | Admitting: Cardiovascular Disease

## 2015-02-27 ENCOUNTER — Encounter (INDEPENDENT_AMBULATORY_CARE_PROVIDER_SITE_OTHER): Payer: Self-pay

## 2015-02-27 VITALS — BP 122/70 | HR 65 | Ht 64.5 in | Wt 192.2 lb

## 2015-02-27 DIAGNOSIS — R0789 Other chest pain: Secondary | ICD-10-CM

## 2015-02-27 DIAGNOSIS — R0602 Shortness of breath: Secondary | ICD-10-CM

## 2015-02-27 DIAGNOSIS — I499 Cardiac arrhythmia, unspecified: Secondary | ICD-10-CM

## 2015-02-27 DIAGNOSIS — I2510 Atherosclerotic heart disease of native coronary artery without angina pectoris: Secondary | ICD-10-CM

## 2015-02-27 DIAGNOSIS — I493 Ventricular premature depolarization: Secondary | ICD-10-CM

## 2015-02-27 DIAGNOSIS — R55 Syncope and collapse: Secondary | ICD-10-CM

## 2015-02-27 DIAGNOSIS — I255 Ischemic cardiomyopathy: Secondary | ICD-10-CM

## 2015-02-27 NOTE — Patient Instructions (Signed)
Your physician has recommended that you wear a holter monitor. Holter monitors are medical devices that record the heart's electrical activity. Doctors most often use these monitors to diagnose arrhythmias. Arrhythmias are problems with the speed or rhythm of the heartbeat. The monitor is a small, portable device. You can wear one while you do your normal daily activities. This is usually used to diagnose what is causing palpitations/syncope (passing out).   Your physician has requested that you have an echocardiogram. Echocardiography is a painless test that uses sound waves to create images of your heart. It provides your doctor with information about the size and shape of your heart and how well your heart's chambers and valves are working. This procedure takes approximately one hour. There are no restrictions for this procedure.   Your physician recommends that you schedule a follow-up appointment in:  1 month

## 2015-02-28 ENCOUNTER — Telehealth: Payer: Self-pay

## 2015-02-28 ENCOUNTER — Ambulatory Visit: Payer: PPO | Admitting: *Deleted

## 2015-02-28 DIAGNOSIS — R0602 Shortness of breath: Secondary | ICD-10-CM

## 2015-02-28 DIAGNOSIS — R55 Syncope and collapse: Secondary | ICD-10-CM

## 2015-02-28 NOTE — Telephone Encounter (Signed)
Notified patient 48 hour monitor can be placed today in our office.

## 2015-02-28 NOTE — Telephone Encounter (Signed)
Pt would like to know if she can get her holter monitor from the hospital. 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

## 2015-02-28 NOTE — Telephone Encounter (Signed)
LMOM to have patient contact our office regarding the holter monitor.

## 2015-03-04 ENCOUNTER — Encounter: Payer: Self-pay | Admitting: Cardiovascular Disease

## 2015-03-04 DIAGNOSIS — I2510 Atherosclerotic heart disease of native coronary artery without angina pectoris: Secondary | ICD-10-CM | POA: Insufficient documentation

## 2015-03-04 DIAGNOSIS — R55 Syncope and collapse: Secondary | ICD-10-CM | POA: Insufficient documentation

## 2015-03-04 DIAGNOSIS — I255 Ischemic cardiomyopathy: Secondary | ICD-10-CM | POA: Insufficient documentation

## 2015-03-04 DIAGNOSIS — I493 Ventricular premature depolarization: Secondary | ICD-10-CM | POA: Insufficient documentation

## 2015-03-04 NOTE — Progress Notes (Signed)
Primary care physician: Dr. [name]  HPI  This is a pleasant 74 year old female who is here today to switch cardiovascular care. She has been followed by Dr. Saralyn Pilar for many years. She has known history of coronary artery disease status post CABG in 1994 with no ischemic cardiac events since then. 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 Most recent treadmill stress test was done in 2013 which was negative for ischemia. She was diagnosed with PVCs in 2015 and was started on small dose Toprol. She has no history of diabetes, hypertension or hyperlipidemia. She is not a smoker. There is family history of coronary artery disease. She denies any recent chest pain. She has chronic exertional dyspnea with no orthopnea or PND. Recently, she had episodes of dizziness and feeling that her heart is about to stop or pause. She felt close to passing out but there was no frank syncope. This happened in more than one occasion.  Allergies  Allergen Reactions  . Ivp Dye [Iodinated Diagnostic Agents] Swelling  . Sulfa Antibiotics      Current Outpatient Prescriptions on File Prior to Visit  Medication Sig Dispense Refill  . aspirin 81 MG tablet Take 81 mg by mouth daily.    . calcium carbonate (OS-CAL) 600 MG TABS tablet Take 600 mg by mouth 2 (two) times daily with a meal.    . cetirizine (ZYRTEC) 10 MG tablet Take 10 mg by mouth as needed for allergies.    . meloxicam (MOBIC) 7.5 MG tablet Take 7.5 mg by mouth daily.    . metoprolol succinate (TOPROL-XL) 25 MG 24 hr tablet Take 25 mg by mouth daily.    Marland Kitchen venlafaxine (EFFEXOR) 75 MG tablet Take 75 mg by mouth daily.      No current facility-administered medications on file prior to visit.     Past Medical History  Diagnosis Date  . Gastroesophageal reflux disease   . Hx of breast cancer   . MI (myocardial infarction)   . Hypertension   . CAD (coronary artery disease)     CABG in 1994  . PVC's (premature ventricular contractions)   . Ischemic cardiomyopathy     Most recent ejection fraction was 40% in 2013     Past Surgical History  Procedure Laterality Date  . Coronary artery bypass graft    . Breast lumpectomy    . Cardiac catheterization      [facility]  . Cardiac catheterization      [facility]     Family History  Problem Relation Age of Onset  . Heart attack Father      History   Social History  . Marital Status: Divorced    Spouse Name: N/A  . Number of Children: N/A  . Years of Education: N/A   Occupational History  . Not on file.   Social History Main Topics  . Smoking status: Former Smoker -- 0.50 packs/day for 35 years    Types: Cigarettes  . Smokeless tobacco: Not on file  . Alcohol Use: No  . Drug Use: No  . Sexual Activity: Not on file   Other Topics Concern  . Not on file   Social History Narrative     ROS A 10 point review of system was performed. It is negative other than that mentioned in the history of present illness.   PHYSICAL EXAM   BP 122/70 mmHg  Pulse 65  Ht 5' 4.5" (  1.638 m)  Wt 192 lb 4 oz (87.204 kg)  BMI 32.50 kg/m2 Constitutional: She is oriented to person, place, and time. She appears well-developed and well-nourished. No distress.  HENT: No nasal discharge.  Head: Normocephalic and atraumatic.  Eyes: Pupils are equal and round. No discharge.  Neck: Normal range of motion. Neck supple. No JVD present. No thyromegaly present.  Cardiovascular: Normal rate, regular rhythm, normal heart sounds. Exam reveals no gallop and no friction rub. No murmur heard.  Pulmonary/Chest: Effort normal and breath sounds normal. No stridor. No respiratory distress. She has no wheezes. She has no rales. She exhibits no tenderness.  Abdominal: Soft. Bowel sounds are normal. She exhibits no distension. There is no tenderness. There is no rebound and no guarding.  Musculoskeletal: Normal range of motion. She exhibits no  edema and no tenderness.  Neurological: She is alert and oriented to person, place, and time. Coordination normal.  Skin: Skin is warm and dry. No rash noted. She is not diaphoretic. No erythema. No pallor.  Psychiatric: She has a normal mood and affect. Her behavior is normal. Judgment and thought content normal.     BSJ:GGEZM  Rhythm  - frequent ectopic ventricular beat s  # VECs = 2 -Left bundle branch block and left axis.   ABNORMAL    ASSESSMENT AND PLAN

## 2015-03-04 NOTE — Assessment & Plan Note (Signed)
She does not seem to be symptomatic.

## 2015-03-04 NOTE — Assessment & Plan Note (Signed)
Evaluate LV systolic function and ejection fraction to see if treatment with an ACE inhibitor or beta blockers are needed.

## 2015-03-04 NOTE — Assessment & Plan Note (Signed)
She has no symptoms of angina. Continue medical therapy. 

## 2015-03-04 NOTE — Assessment & Plan Note (Signed)
From her description, this could be related to a bradycardia arrhythmic event. She does have underlying left bundle branch block and was started on a beta blocker last year. I recommend evaluation with a Holter monitor. I also requested an echocardiogram to evaluate LV systolic function.

## 2015-03-08 ENCOUNTER — Telehealth: Payer: Self-pay | Admitting: *Deleted

## 2015-03-08 NOTE — Telephone Encounter (Signed)
LVM to inform patient her holter report showed  NSR with short runs of SVT and NSVT  F/U as planned per Dr. Fletcher Anon

## 2015-03-09 ENCOUNTER — Ambulatory Visit (INDEPENDENT_AMBULATORY_CARE_PROVIDER_SITE_OTHER): Payer: PPO

## 2015-03-09 DIAGNOSIS — I493 Ventricular premature depolarization: Secondary | ICD-10-CM

## 2015-03-09 DIAGNOSIS — R55 Syncope and collapse: Secondary | ICD-10-CM

## 2015-03-09 NOTE — Telephone Encounter (Signed)
Spoke w/ pt. Explained holter results to her in more detail, as she is quite anxious and does not understand the message that was left for her.  Comforted pt, she is appreciative of the call and will call back w/ any questions or concerns.

## 2015-03-09 NOTE — Telephone Encounter (Signed)
Pt is returning our call, would like a nurse to call back for she is going out of town and knows Elmyra Ricks is not in the office.  Please advise.

## 2015-03-26 ENCOUNTER — Other Ambulatory Visit (INDEPENDENT_AMBULATORY_CARE_PROVIDER_SITE_OTHER): Payer: PPO

## 2015-03-26 ENCOUNTER — Other Ambulatory Visit: Payer: Self-pay

## 2015-03-26 DIAGNOSIS — R55 Syncope and collapse: Secondary | ICD-10-CM

## 2015-03-26 DIAGNOSIS — R0789 Other chest pain: Secondary | ICD-10-CM

## 2015-03-26 DIAGNOSIS — I499 Cardiac arrhythmia, unspecified: Secondary | ICD-10-CM | POA: Diagnosis not present

## 2015-03-26 DIAGNOSIS — R0602 Shortness of breath: Secondary | ICD-10-CM | POA: Diagnosis not present

## 2015-03-29 ENCOUNTER — Encounter: Payer: Self-pay | Admitting: Cardiovascular Disease

## 2015-03-29 ENCOUNTER — Ambulatory Visit (INDEPENDENT_AMBULATORY_CARE_PROVIDER_SITE_OTHER): Payer: PPO | Admitting: Cardiovascular Disease

## 2015-03-29 VITALS — BP 142/80 | HR 62 | Ht 64.5 in | Wt 193.5 lb

## 2015-03-29 DIAGNOSIS — E785 Hyperlipidemia, unspecified: Secondary | ICD-10-CM

## 2015-03-29 DIAGNOSIS — R0602 Shortness of breath: Secondary | ICD-10-CM

## 2015-03-29 DIAGNOSIS — I493 Ventricular premature depolarization: Secondary | ICD-10-CM | POA: Diagnosis not present

## 2015-03-29 DIAGNOSIS — I25118 Atherosclerotic heart disease of native coronary artery with other forms of angina pectoris: Secondary | ICD-10-CM

## 2015-03-29 DIAGNOSIS — I255 Ischemic cardiomyopathy: Secondary | ICD-10-CM | POA: Diagnosis not present

## 2015-03-29 NOTE — Progress Notes (Signed)
Primary care physician: Dr. [name]  HPI  This is a pleasant 74 year old female who is here today for a follow-up visit. She has known history of coronary artery disease status post CABG in 1994 with no ischemic cardiac events since then. 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 Most recent treadmill stress test was done in 2013 which was negative for ischemia. She was diagnosed with PVCs in 2015 and was started on small dose Toprol. She has no history of diabetes, hypertension or hyperlipidemia. She is not a smoker. There is family history of coronary artery disease. During her initial visit, she complained of dizziness and presyncope. She has underlying left bundle branch block. I proceeded with a Holter monitor which showed normal sinus rhythm with short runs of SVT and nonsustained ventricular tachycardia. Echocardiogram showed normal LV systolic function with an ejection fraction of 55-60%, mild mitral regurgitation, moderately dilated left atrium and mild pulmonary hypertension. She is complaining of dyspnea with minimal activities.  Allergies  Allergen Reactions  . Ivp Dye [Iodinated Diagnostic Agents] Swelling  . Sulfa Antibiotics      Current Outpatient Prescriptions on File Prior to Visit  Medication Sig Dispense Refill  . Budesonide-Formoterol Fumarate (SYMBICORT IN) Inhale into the lungs as needed.    . calcium carbonate (OS-CAL) 600 MG TABS tablet Take 600 mg by mouth 2 (two) times daily with a meal.    . cetirizine (ZYRTEC) 10 MG tablet Take 10 mg by mouth as needed for allergies.    . fluticasone (FLONASE) 50 MCG/ACT nasal spray Place 1 spray into both nostrils daily.    . meloxicam (MOBIC) 7.5 MG tablet Take 7.5 mg by mouth daily.    . metoprolol succinate (TOPROL-XL) 25 MG 24 hr tablet Take 25 mg by mouth daily.    . Multiple Vitamin (MULTIVITAMIN) tablet Take 1 tablet by mouth daily.    Marland Kitchen  venlafaxine (EFFEXOR) 75 MG tablet Take 75 mg by mouth daily.      No current facility-administered medications on file prior to visit.     Past Medical History  Diagnosis Date  . Gastroesophageal reflux disease   . Hx of breast cancer   . MI (myocardial infarction)   . Hypertension   . CAD (coronary artery disease)     CABG in 1994  . PVC's (premature ventricular contractions)   . Ischemic cardiomyopathy     Most recent ejection fraction was 40% in 2013  . Rheumatoid arthritis      Past Surgical History  Procedure Laterality Date  . Coronary artery bypass graft    . Breast lumpectomy    . Cardiac catheterization      [facility]  . Cardiac catheterization      [facility]     Family History  Problem Relation Age of Onset  . Heart attack Father      History   Social History  . Marital Status: Divorced    Spouse Name: N/A  . Number of Children: N/A  . Years of Education: N/A   Occupational History  . Not on file.   Social History Main Topics  . Smoking status: Former Smoker -- 0.50 packs/day for 35 years    Types: Cigarettes  . Smokeless tobacco: Not on file  . Alcohol Use: No  . Drug Use: No  . Sexual Activity: Not on file   Other Topics Concern  . Not on file   Social History  Narrative     ROS A 10 point review of system was performed. It is negative other than that mentioned in the history of present illness.   PHYSICAL EXAM   BP 142/80 mmHg  Pulse 62  Ht 5' 4.5" (1.638 m)  Wt 193 lb 8 oz (87.771 kg)  BMI 32.71 kg/m2 Constitutional: She is oriented to person, place, and time. She appears well-developed and well-nourished. No distress.  HENT: No nasal discharge.  Head: Normocephalic and atraumatic.  Eyes: Pupils are equal and round. No discharge.  Neck: Normal range of motion. Neck supple. No JVD present. No thyromegaly present.  Cardiovascular: Normal rate, regular rhythm, normal heart sounds. Exam reveals no gallop and no friction rub. No murmur  heard.  Pulmonary/Chest: Effort normal and breath sounds normal. No stridor. No respiratory distress. She has no wheezes. She has no rales. She exhibits no tenderness.  Abdominal: Soft. Bowel sounds are normal. She exhibits no distension. There is no tenderness. There is no rebound and no guarding.  Musculoskeletal: Normal range of motion. She exhibits no edema and no tenderness.  Neurological: She is alert and oriented to person, place, and time. Coordination normal.  Skin: Skin is warm and dry. No rash noted. She is not diaphoretic. No erythema. No pallor.  Psychiatric: She has a normal mood and affect. Her behavior is normal. Judgment and thought content normal.       ASSESSMENT AND PLAN

## 2015-03-29 NOTE — Assessment & Plan Note (Signed)
Recent ejection fraction was normal by echo.

## 2015-03-29 NOTE — Assessment & Plan Note (Signed)
Continue treatment with metoprolol.

## 2015-03-29 NOTE — Assessment & Plan Note (Signed)
There is a strong indication for treatment with a statin. However, the patient is concerned about potential myalgia. This was discussed with her today but she did not change her mind.

## 2015-03-29 NOTE — Assessment & Plan Note (Signed)
Recent ejection fraction was normal by echo. The patient does complain of dyspnea with minimal activities which might be angina equivalent especially with underlying nonsustained ventricular tachycardia. Due to that, I recommend evaluation with a pharmacologic nuclear stress test. She has an underlying left bundle branch block.

## 2015-03-29 NOTE — Patient Instructions (Addendum)
Your physician recommends that you schedule a follow-up appointment in: 1 month.       [place]  Your caregiver has ordered a Stress Test with nuclear imaging. The purpose of this test is to evaluate the blood supply to your heart muscle. This procedure is referred to as a "Non-Invasive Stress Test." This is because other than having an IV started in your vein, nothing is inserted or "invades" your body. Cardiac stress tests are done to find areas of poor blood flow to the heart by determining the extent of coronary artery disease (CAD). Some patients exercise on a treadmill, which naturally increases the blood flow to your heart, while others who are  unable to walk on a treadmill due to physical limitations have a pharmacologic/chemical stress agent called Lexiscan . This medicine will mimic walking on a treadmill by temporarily increasing your coronary blood flow.   Please note: these test may take anywhere between 2-4 hours to complete  PLEASE REPORT TO [place] AT THE FIRST DESK WILL DIRECT YOU WHERE TO GO  Date of Procedure:__________April 14, 2016 _______________  Arrival Time for Procedure:________7:15 a.m. ______________  Instructions regarding medication:   _________________________________________________________________________________________  PLEASE NOTIFY THE OFFICE AT LEAST 24 HOURS IN ADVANCE IF YOU ARE UNABLE TO KEEP YOUR APPOINTMENT.  [phone] AND  PLEASE NOTIFY NUCLEAR MEDICINE AT [facility] AT LEAST 24 HOURS IN ADVANCE IF YOU ARE UNABLE TO KEEP YOUR APPOINTMENT. [phone]  How to prepare for your Myoview test:  1. Do not eat or drink after midnight 2. No caffeine for 24 hours prior to test 3. No smoking 24 hours prior to test. 4. Your medication may be taken with water.  If your doctor stopped a medication because of this test, do not take that medication. 5. Ladies, please do not wear dresses.  Skirts or pants are  appropriate. Please wear a short sleeve shirt. 6. No perfume, cologne or lotion. 7. Wear comfortable walking shoes. No heels!

## 2015-04-02 ENCOUNTER — Encounter: Payer: Self-pay | Admitting: *Deleted

## 2015-04-05 ENCOUNTER — Ambulatory Visit
Admit: 2015-04-05 | Disposition: A | Discharge status: Home / Self Care | Payer: Self-pay | Attending: Cardiovascular Disease | Admitting: Cardiovascular Disease

## 2015-04-05 DIAGNOSIS — R0602 Shortness of breath: Secondary | ICD-10-CM | POA: Diagnosis not present

## 2015-04-06 ENCOUNTER — Other Ambulatory Visit: Payer: Self-pay | Admitting: *Deleted

## 2015-04-06 DIAGNOSIS — R0602 Shortness of breath: Secondary | ICD-10-CM

## 2015-04-14 NOTE — Op Note (Signed)
PATIENT NAME:  Teresa [name], Teresa [name] MR#:  [id number] DATE OF BIRTH:  03/23/1941  DATE OF PROCEDURE:  09/11/2014  PREOPERATIVE DIAGNOSIS: Cataract left eye.   POSTOPERATIVE DIAGNOSIS: Cataract left eye.   PROCEDURE PERFORMED: Extracapsular cataract extraction using phacoemulsification with placement of Alcon [id number], 20.0 diopter posterior chamber lens, serial number [id number].     ANESTHESIA: 4% lidocaine and 0.75% Marcaine in a 50/50 mixture with 10 units/mL of Hylenex added given as a peribulbar.    ANESTHESIOLOGIST:  Dr. Benjamine Mola.   COMPLICATIONS: None.   ESTIMATED BLOOD LOSS: Less than 1 mL.   DESCRIPTION OF PROCEDURE:  The patient was brought to the operating room and given a peribulbar block.  The patient was then prepped and draped in the usual fashion.  The vertical rectus muscles were imbricated using 5-0 silk sutures.  These sutures were then clamped to the sterile drapes as bridle sutures.  A limbal peritomy was performed extending two clock hours and hemostasis was obtained with cautery.  A partial thickness scleral groove was made at the surgical limbus and dissected anteriorly in a lamellar dissection using an Alcon crescent knife.  The anterior chamber was entered supero-temporally with a Superblade and through the lamellar dissection with a 2.6 mm keratome.  DisCoVisc was used to replace the aqueous and a continuous tear capsulorrhexis was carried out.  Hydrodissection and hydrodelineation were carried out with balanced salt and a 27 gauge canula.  The nucleus was rotated to confirm the effectiveness of the hydrodissection.  Phacoemulsification was carried out using a divide-and-conquer technique.  Total ultrasound time was 54 seconds with an average power of 20.0 percent.  CDE of 19.07.   Irrigation/aspiration was used to remove the residual cortex.  DisCoVisc was used to inflate the capsule and the internal incision was enlarged to 3 mm with the crescent knife.  The intraocular lens  was folded and inserted into the capsular bag using the AcrySert delivery system.  Irrigation/aspiration was used to remove the residual DisCoVisc.  Miostat was injected into the anterior chamber through the paracentesis track to inflate the anterior chamber and induce miosis.  0.1 mL of cefuroxime was injected via the paracentesis track containing 1 mg of drug. The wound was checked for leaks and none were found. The conjunctiva was closed with cautery and the bridle sutures were removed.  Two drops of 0.3% Vigamox were placed on the eye.   An eye shield was placed on the eye.  The patient was discharged to the recovery room in good condition.   ____________________________ Loura Back Samantha Ragen, MD sad:bu D: 09/11/2014 13:39:00 ET T: 09/11/2014 14:19:44 ET JOB#: [id number]  cc: Remo Lipps A. Vicy Medico, MD, <Dictator> Martie Lee MD ELECTRONICALLY SIGNED 09/18/2014 13:23

## 2015-04-14 NOTE — Op Note (Signed)
PATIENT NAME:  Teresa [name], Teresa [name] MR#:  [id number] DATE OF BIRTH:  12/03/1941  DATE OF PROCEDURE:  07/03/2014  PREOPERATIVE DIAGNOSIS: Cataract, right eye.   POSTOPERATIVE DIAGNOSIS: Cataract, right eye.   PROCEDURE PERFORMED: Extracapsular cataract extraction using phacoemulsification placing an Alcon SN6CWS 19.5-diopter posterior chamber lens, serial number [id number].    SURGEON: Loura Back. Ethne Jeon, M.D.   ASSISTANT:  None.  ANESTHESIA: 4% lidocaine and 0.75% Marcaine a 50-50 mixture with 10 units/mL of HyoMax added, given as a peribulbar.  ANESTHESIOLOGIST: Dr.Poulin  COMPLICATIONS: None.   ESTIMATED BLOOD LOSS: Less than 1 mL.   DESCRIPTION OF PROCEDURE:  The patient was brought to the operating room and given a peribulbar block.  The patient was then prepped and draped in the usual fashion.  The vertical rectus muscles were imbricated using 5-0 silk sutures.  These sutures were then clamped to the sterile drapes as bridle sutures.  A limbal peritomy was performed extending two clock hours and hemostasis was obtained with cautery.  A partial thickness scleral groove was made at the surgical limbus and dissected anteriorly in a lamellar dissection using an Alcon crescent knife.  The anterior chamber was entered superonasally with a Superblade and through the lamellar dissection with a 2.6 mm keratome.  DisCoVisc was used to replace the aqueous and a continuous tear capsulorrhexis was carried out.  Hydrodissection and hydrodelineation were carried out with balanced salt and a 27 gauge canula.  The nucleus was rotated to confirm the effectiveness of the hydrodissection.  Phacoemulsification was carried out using a divide-and-conquer technique.  Total ultrasound time was 1 minute and 2 seconds with an average power of 20.1 percent. CDE of 19.48. No suture was placed.   Irrigation/aspiration was used to remove the residual cortex.  DisCoVisc was used to inflate the capsule and the  internal incision was enlarged to 3 mm with the crescent knife.  The intraocular lens was folded and inserted into the capsular bag using the Alcon AcrySert delivery system. Irrigation/aspiration was used to remove the residual DisCoVisc.  Miostat was injected into the anterior chamber through the paracentesis track to inflate the anterior chamber and induce miosis. A tenth of a milliliter of cefuroxime was injected via the paracentesis track containing 1 mg of drug. The wound was checked for leaks and none were found. The conjunctiva was closed with cautery and the bridle sutures were removed.  Two drops of 0.3% Vigamox were placed on the eye.   An eye shield was placed on the eye.  The patient was discharged to the recovery room in good condition.   ____________________________ Loura Back Matisha Termine, MD sad:lt D: 07/03/2014 12:48:04 ET T: 07/04/2014 00:17:08 ET JOB#: [id number]  cc: Remo Lipps A. Klarisa Barman, MD, <Dictator> Martie Lee MD ELECTRONICALLY SIGNED 07/10/2014 12:32

## 2015-04-14 NOTE — Discharge Summary (Signed)
PATIENT NAME:  Teresa [name], QUESNEL MR#:  [id number] DATE OF BIRTH:  09/05/1941  DATE OF ADMISSION:  12/19/2013 DATE OF DISCHARGE:  12/22/2013  ADMITTING DIAGNOSIS: Degenerative arthrosis of the left knee.   DISCHARGE DIAGNOSES:  1.  Degenerative arthrosis of the left knee.  2.  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   HISTORY OF PRESENT ILLNESS:   The patient is a 74 year old who has been [facility] for progression of left knee pain. She had reported a several year history of progressive left knee pain. She had not seen any significant improvement in her condition despite the use of glucosamine, meloxicam as well as corticosteroid injections. The patient had localized most of the pain along the medial aspect of the knee. She also had denied any gross locking of the knee, but did state that she had some near giving way. Her pain was aggravated with weight-bearing activities. The patient states that the knee pain had progressed to the point that it was significantly interfering with her activities of daily living. X-rays taken in [place] showed medial cartilage space bone-on-bone as well as the patient being associated with varus alignment. Osteophyte and subchondral sclerosis were noted. After risks and complications of surgery were explained to the patient, she agreed for plans for surgical intervention.   PROCEDURE: Left total knee arthroplasty using computer-assisted navigation.   ANESTHESIA: General after attempted spinal was unsuccessful.  SOFT TISSUE RELEASE: Anterior cruciate ligament, posterior cruciate ligament, deep medial collateral ligament, as well as the patellofemoral ligament.   IMPLANTS UTILIZED: DePuy PFC Sigma size 3 posterior stabilized femoral component (cemented), size 3 MBT tibial component (cemented), 32 mm 3 pegged oval dome patella (cemented), and a 10 mm stabilized rotating platform polyethylene insert. Gentamicin bone cement was utilized.    HOSPITAL COURSE: The patient tolerated the procedure very well. She had no complications other than immediately following spinal, she was noted to have left foot pain. She was then taken to the PACU following surgery where she was stabilized. In PACU she continued to complain of left foot pain. Anesthesia was notified of this. She was stabilized and then transferred to the orthopedic floor. She began receiving anticoagulation therapy of Lovenox 30 mg subcutaneous q. 12 hours per anesthesia and pharmacy protocol. She was fitted with TED stockings bilaterally. These were allowed to be removed 1 hour per 8 hour shift. The left one was unable to be applied because of the increased sensitivity of the foot.  She was also fitted with the AVI compression foot pumps bilaterally set at 80 mmHg but once again because of the pain to the left foot, the left one was not able to be applied. She has denied any evidence of any kind of calf discomfort. Negative Homans sign. No evidence of any DVTs. Left foot was warm and dry. It was noted to be lightly sensitivity to touch.  No increased redness was noted.  Some swelling of the leg but that appeared to be normal for postoperative course.    The patient denied any chest pain or shortness of breath. Vital signs have been stable. She has been afebrile. Hemodynamically she was able and no transfusions were given. She did receive Autovac transfusions the first 6 hours postoperatively.    The patient's IV, Foley and Hemovac were discontinued on day 2 along with a dressing change. The wound was free of any drainage or signs of infection. Polar Care was reapplied to the surgical leg, maintaining a temperature of 40 to  50 degrees Fahrenheit. Physical therapy was initiated on day 1 for gait training and transfers. She has done very well. Upon being discharged was ambulating greater than 200 feet. She was able to go up and down 4 sets of steps.  Was  independent with bed to chair  transfers. Occupational therapy was also initiated on day 1 for ADL and assistive devices.   The patient's hospital course has been slightly complicated because of this left foot pain. The first day after surgery, upon questioning the patient, we had asked about any history of any gout with her or any family members.  She knew of none.  Subsequently, a uric acid level was ordered which did not show any elevation. This was then felt to be possibly a complication from the spinal. She was placed on gabapentin 300 mg daily. She saw no real improvement with this.  Otherwise, the patient states that the knee has done great and her only complaint is that of the left foot.  Once again, anesthesia was consulted about the patient complaining of the left foot.  DISPOSITION:  The patient was discharged to home in improved stable condition. She was allowed to continue weight-bearing as tolerated. Continue using a walker until cleared by physical therapy to go to a quad cane. She will receive home health PT. Continued to elevate the lower extremity. Continue with TED stockings bilaterally. These are allowed to be removed at night but are to be worn during the day. Elevate the heels off the bed. Recommend that she continue incentive spirometer q. 1 hour while awake. Encourage cough, deep breathing every 2 hours while awake. Continue with Polar Care maintaining temperature of 40 to 50 degrees Fahrenheit. Recommend to leave the dressing in place and if needed, the physical therapist with changes this.  Do not get the incision wet until the staples are removed in 2 weeks. She does have a follow-up appointment on 01/13 and a 8:30 for suture removal.  She is to call the clinic sooner if any temperatures of 101.5 or greater or excessive bleeding. She was placed on a regular diet.   DRUG ALLERGIES: CODEINE, DEMEROL, IVP DYE, SULFA DRUGS.   MEDICATIONS: She is to resume her regular medication that she was on prior to admission.  She was given a prescription for oxycodone 5 to 10 mg every 4 to 6 hours p.r.n. for pain, tramadol 50 to 100 mg every 4 to 6 hours p.r.n. for pain, Lovenox 40 mg subcutaneously daily for 14 days, then discontinue and begin taking one 81 mg enteric-coated aspirin. A prescription for gabapentin 300 mg daily was given. May need to increase this if no improvement.   PAST MEDICAL HISTORY: 1.  Seasonal allergies.  2.  Arthritis.  3.  Asthma.  4.  Heart attack. 5.  Jaundice.  6.  Hernia. 7.  Osteoporosis. 8.  Breast cancer. 9.  Coronary artery disease.  10.  Hypertension.    ____________________________ Vance Peper, PA jrw:dp D: 12/30/2013 10:38:31 ET T: 12/30/2013 11:11:37 ET JOB#: [id number]  cc: Vance Peper, PA, <Dictator> JON WOLFE PA ELECTRONICALLY SIGNED 01/05/2014 7:38

## 2015-04-14 NOTE — Op Note (Signed)
PATIENT NAME:  Teresa [name], Teresa [name] MR#:  [id number] DATE OF BIRTH:  [birth year]  DATE OF PROCEDURE:  12/19/2013  PREOPERATIVE DIAGNOSIS: Degenerative arthrosis of the left knee.   POSTOPERATIVE DIAGNOSIS: Degenerative arthrosis of the left knee.   PROCEDURE PERFORMED: Left total knee arthroplasty using computer-assisted navigation.   SURGEON: Laurice Record. Holley Bouche., MD   ASSISTANT: Vance Peper, PA (required to maintain retraction throughout the procedure).   ANESTHESIA: General.   ESTIMATED BLOOD LOSS: 100 mL.   FLUIDS REPLACED: 1400 mL of crystalloid.   TOURNIQUET TIME: 108 minutes.   DRAINS: Two medium drains to reinfusion system.   SOFT TISSUE RELEASES: Anterior cruciate ligament, posterior cruciate ligament, deep medial collateral ligament, patellofemoral ligament.   IMPLANTS UTILIZED: DePuy PFC Sigma size 3 posterior stabilized femoral component (cemented), size 3 MBT tibial component (cemented), 32-mm 3-peg oval dome patella (cemented), and a 10-mm stabilized rotating platform polyethylene insert. Gentamicin bone cement was utilized.   INDICATIONS FOR SURGERY: The patient is a 74 year old female, who has been seen for complaints of progressive left knee pain. X-rays demonstrated severe degenerative changes in a tricompartmental fashion with relative varus deformity. After discussion of the risks and benefits of surgical intervention, the patient expressed understanding of the risks and benefits, and agreed with plans for surgical intervention.   PROCEDURE IN DETAIL: The patient was brought into the operating room, and after adequate general anesthesia was achieved (spinal anesthesia was attempted, but was not successful), a tourniquet was placed on the patient's upper left thigh. The patient's left knee and leg were cleaned and prepped with alcohol and DuraPrep and draped in the usual sterile fashion. A "timeout" was performed as per usual protocol. The left lower extremity was exsanguinated  using an Esmarch, and the tourniquet was inflated to 300 mmHg.  An anterior longitudinal incision was made, followed by a standard mid vastus approach. A moderate effusion was evacuated. The deep fibers of the medial collateral ligament were elevated in a subperiosteal fashion off the medial flare of the tibia, so as to maintain a continuous soft tissue sleeve. The patella was subluxed laterally, and the patellofemoral ligament was incised. Inspection of the knee demonstrated severe degenerative changes in tricompartmental fashion with full-thickness loss of articular cartilage to the medial compartment. Prominent osteophytes were debrided using a rongeur. The anterior and posterior cruciate ligaments were excised. Two 4.0 mm Schanz pins were inserted into the femur and into the tibia for attachment of the array of trackers used for computer-assisted navigation. Hip center was identified using circumduction technique. Distal landmarks were mapped using the computer. The distal femur and proximal tibia were mapped using the computer. Distal femoral cutting guide was positioned using computer-assisted navigation, so as to achieve a 5-degree distal valgus cut. Cut was performed and verified using the computer. Distal femur was sized, and it was felt that a size 3 femoral component was appropriate. A size 3 cutting guide was positioned, and the anterior cut was performed and verified using the computer. This was followed by completion of the posterior and chamfer cuts. Femoral cutting guide for central box was then positioned, and the central box cut was performed.   Attention was then directed to the proximal tibia. Medial and lateral menisci were excised. The extramedullary tibial cutting guide was positioned using computer-assisted navigation, so as to achieve a 0-degree varus-valgus alignment and 0-degree posterior slope. Cut was performed and verified using the computer. The proximal tibia was sized, and it was  felt that a size 3  tibial tray was appropriate. Tibial and femoral trials were inserted, followed by insertion of a 10-mm polyethylene insert. Good medial and lateral soft tissue balancing was appreciated, both in flexion and extension. Finally, the patella was cut and prepared, so as to accommodate a 32-mm 3-peg oval dome patella. Patellar trial was placed, and the knee was placed through a range of motion with excellent patellar tracking appreciated. The femoral trial was removed. Central post hole for the tibial component was reamed, followed by insertion of a keel punch. Tibial trials were then removed. The cut surfaces of bone were irrigated with copious amounts of normal saline with antibiotic solution using pulsatile lavage and then suctioned dry. Polymethylmethacrylate cement with gentamicin was prepared in the usual fashion.   Cement was applied to the cut surfaces of the proximal tibia, as well as along the undersurface of the size 3 MBT tibial component. The tibial component was positioned and impacted into place. Excess cement was removed using [employer] fast streamer. Cement was then applied to the cut surfaces of the femur, as well as along the posterior flanges of a size 3 posterior stabilized femoral component. Femoral component was positioned and impacted into place. Excess cement was removed using [employer] fast streamer. A 10-mm polyethylene trial was inserted, and the knee was brought into full extension with steady axial compression applied. Finally, cement was applied to the backside of a 32-mm 3-peg oval dome patella, and the patellar component was positioned and patellar clamp applied. Excess cement was removed using [employer] fast streamer.   After adequate curing of cement, the tourniquet was deflated after a total tourniquet time of 108 minutes. Hemostasis was achieved using electrocautery. The knee was irrigated with copious amounts of normal saline with antibiotic solution using pulsatile lavage and then  suctioned dry. The knee was inspected for any residual cement debris. Then, 20 mL of 1.3% Exparel in 40 mL of normal saline was injected along the posterior capsule, medial and lateral gutters, and along the arthrotomy site. A 10-mm stabilized rotating platform polyethylene insert was inserted, and the knee was placed through a range of motion. Excellent patellar tracking was appreciated, and excellent medial and lateral soft tissue balancing was appreciated, both in full extension and in flexion. Two medium drains were placed in the wound bed and brought out through a separate stab incision to be attached to reinfusion system. The medial parapatellar portion of the incision was reapproximated using interrupted sutures of #1 Vicryl. The subcutaneous tissue was approximated in layers using first #0 Vicryl, followed by #2-0 Vicryl. It should be noted that 30 mL of 0.25% Marcaine with epinephrine was injected along the incision site in the subcutaneous tissue. Skin was reapproximated with skin staples. A sterile dressing was applied.   The patient tolerated the procedure well. She was transported to the recovery room in stable condition.    ____________________________ Laurice Record. Holley Bouche., MD jph:ms D: 12/19/2013 16:03:45 ET T: 12/19/2013 19:46:36 ET JOB#: [id number]  cc: Jeneen Rinks P. Holley Bouche., MD, <Dictator> Laurice Record Holley Bouche MD ELECTRONICALLY SIGNED 12/25/2013 15:58

## 2015-04-15 NOTE — Discharge Summary (Signed)
PATIENT NAME:  Teresa [name], Teresa [name] MR#:  [id number] DATE OF BIRTH:  [birth year]  DATE OF ADMISSION:  02/25/2012 DATE OF DISCHARGE:  02/27/2012   ADMITTING DIAGNOSIS: Degenerative arthrosis of left hip.   DISCHARGE DIAGNOSIS: Degenerative arthrosis of left hip.   HISTORY OF PRESENT ILLNESS: The patient is a 74 year old female who has been followed at [facility] for progression of left hip and groin pain. She had reported a long history of intermittent left hip and groin pain. She stated the pain had increased significantly over the course of the last year. She had reported pain with weightbearing activities and had also noted some decrease in her range of motion of the hip. Prior to surgery she was having difficulty with activity such as donning and doffing of her shoes and socks. The patient had not seen any significant improvement despite use of anti-inflammatory medications, glucosamine/chondroitin, as well as activity modification. The patient had reported left knee pain along the medial aspect of the knee as well. She had also reported history of back pain that was aggravated secondary to the hip. At the time of surgery, she was using gabapentin for lower extremity neuropathy. The patient states that the pain had increased to the point that it was significantly interfering with her activities of daily living. X-rays taken in the clinic showed significant narrowing of the cartilage space with bone-on-bone articulation noted mainly superiorly. Subchondral sclerosis as well as subchondral cyst formation was noted. Prominent acetabular osteophytes were noted. After discussion of the risks and benefits of surgical intervention, the patient expressed her understanding of the risks and benefits and agreed for plans for surgical intervention.   PROCEDURE: Left total hip arthroplasty.   ANESTHESIA: General.   IMPLANTS UTILIZED: DePuy 12 mm small statue Prodigy femoral component, 52 mm outer diameter  Pinnacle 100 acetabular component, a 36 mm inner diameter +4 mm, 10 degree Pinnacle Marathon polyethylene liner, and a 36 mm diameter aSphere femoral head with a +5 mm neck length.    HOSPITAL COURSE: The patient tolerated the procedure very well. She had no complications. She was then taken to PAC-U where she was stabilized and then transferred to the orthopedic floor. The patient began receiving anticoagulation therapy of Lovenox 30 mg sub-Q q.12 hours per anesthesia and pharmacy protocol. She was fitted with TED stockings bilaterally. These were allowed to be removed one hour per eight hour shift. She was fitted with the AV-I compression foot pumps set at 80 mmHg. Her calves have been nontender, free of any evidence of any deep venous thromboses. Heels were elevated off the bed using rolled towels. Heels were nontender. No tissue breakdown noted. The patient complained more of back pain versus the hip pain. She had to sleep on her side a lot because of the back issues.   The patient's vital signs have been stable. She has been afebrile. Hemodynamically she was stable. No transfusions were given. She is denying chest pain or shortness of breath.   Physical therapy was initiated on day one for gait training and transfers. She has done well. She  progressed at a normal rate. Occupational therapy was also initiated on day one for activities of daily living and assistive devices.   The patient's IV, Foley, and Hemovac were all discontinued on day two along with a dressing change. The wound was free of any drainage or any signs of infection.   DISPOSITION: The patient is being discharged to skilled nursing facility in improved stable condition.   DISCHARGE  INSTRUCTIONS:  1. She will continue PT for gait training and transfers, OT for activities of daily living and assistive devices.  2. She may weight bear as tolerated.  3. Elevate the heels off the bed.  4. TED stockings are to be worn 24 hours a day  but may be removed one hour per eight hour shift.  5. Abduction pillow between legs when in bed.  6. Incentive spirometry q.1 hour while awake.  7. Encourage cough, deep breathing q.2 hours while awake.  8. She placed on a regular diet.  9. She is to follow-up with Dr. Marry Guan on April 23rd at 2:15.  10. She is to call the clinic if she has any complications.   DRUG ALLERGIES: Codeine, Demerol, IVP dye, sulfa drugs.   MEDICATIONS:  1. Tylenol ES 500 to 1000 mg q.4 hours p.r.n. for pain and fever.  2. Oxycodone 5 to 10 mg q.4 hours p.r.n.  3. Ultram 50 to 100 mg q.4 hours p.r.n.  4. Senokot-S 1 tablet b.i.d.  5. Milk of Magnesia 30 mL b.i.d. p.r.n.  6. Dulcolax suppository 10 mg rectally daily p.r.n. for constipation.  7. Enema soapsuds if no results with Milk of Magnesia or Dulcolax p.r.n.  8. Mylanta DS 30 mL q.6 hours p.r.n.  9. Metamucil SF package powder one packet daily. 10. Prilosec 40 mg q.6 a.m.  11. Zyrtec 10 mg daily. 12. Cyanocobalamin 1000 mcg daily. 13. Ambien 5 mg at bedtime p.r.n.  14. Citracal Plus D 1 tablet b.i.d.  15. Lovenox 30 mg sub-Q q.12 hours for 14 days, then discontinue and begin taking one 81 mg enteric-coated aspirin per day.  16. Benadryl 25 mg q.4 to 6 hours p.r.n. for itching. 17. Effexor XR 75 mg at bedtime.   PAST MEDICAL HISTORY:  1. Seasonal allergies.  2. Arthritis.  3. Heart attack. 4. Jaundice. 5. Hernia.   6. Osteoporosis.  7. Breast cancer. 8. Coronary artery disease.   ____________________________ Vance Peper, PA [id number] D: 02/27/2012 07:11:25 ET T: 02/27/2012 07:40:58 ET JOB#: [id number]  cc: Vance Peper, PA, <Dictator> Zaire Levesque PA ELECTRONICALLY SIGNED 02/29/2012 19:57

## 2015-04-15 NOTE — Op Note (Signed)
PATIENT NAME:  Teresa [name], Teresa [name] MR#:  [id number] DATE OF BIRTH:  1941-09-12  DATE OF PROCEDURE:  02/25/2012  PREOPERATIVE DIAGNOSIS: Degenerative arthrosis of the left hip.   POSTOPERATIVE DIAGNOSIS: Degenerative arthrosis of the left hip.   PROCEDURE PERFORMED:  Left total hip arthroplasty.   SURGEON: Laurice Record. Holley Bouche., MD   ASSISTANT: Vance Peper, PA-C (required to maintain retraction throughout the procedure)   ANESTHESIA: General.   ESTIMATED BLOOD LOSS: 350 mL.   FLUIDS REPLACED: 2000 mL of crystalloid.   DRAINS: Two medium drains to a Hemovac reservoir.   IMPLANTS UTILIZED: DePuy 12 mm small stature Prodigy femoral component, 52 mm outer diameter Pinnacle 100 acetabular component, a 36 mm inner diameter +4 mm, 10-degree Pinnacle Marathon polyethylene liner, and a 36 mm outer diameter aSphere femoral head with a + 5 mm neck length.   INDICATIONS FOR SURGERY: The patient is a 74 year old female who has been seen for complaints of severe left hip pain with limited range of motion. X-rays demonstrated severe degenerative changes. She had not seen any significant improvement despite conservative nonsurgical intervention. After a discussion of the risks and benefits of surgical intervention, the patient expressed her understanding of the risks, benefits, and agreed with plans for surgical intervention.   PROCEDURE IN DETAIL: The patient was brought into the Operating Room, and after adequate spinal anesthesia was achieved, the patient was placed in the right lateral decubitus position. An axillary roll was placed and all bony prominences were well padded. The patient's left hip and leg were cleaned and prepped with alcohol and DuraPrep and draped in the usual sterile fashion. A "timeout" was performed as per usual protocol. A lateral curvilinear incision was made gently curving towards the posterior superior iliac spine. The IT band was incised in line with the skin incision, and the fibers  of the gluteus maximus were split in line. The piriformis tendon was identified, skeletonized, and incised at its insertion at the proximal femur and reflected posteriorly. In a similar fashion, the short external rotators were incised and reflected posteriorly. A T-type posterior capsulotomy was performed. Prior to dislocation of the femoral head, a threaded Steinmann pin was inserted through a separate stab incision into the pelvis superior to the acetabulum and bent in the form of a stylus so as to assess limb length and hip offset throughout the procedure. The femoral head was then dislocated posteriorly. Severe degenerative changes were noted. The femoral neck cut was performed using an oscillating saw. The anterior capsule was elevated off of the femoral neck. Inspection of the acetabulum also demonstrated significant degenerative changes with prominent posterior and inferior osteophytes. The remnant of the labrum was excised. The acetabulum was reamed in a sequential fashion up to a 51 mm diameter. Good punctate bleeding bone was encountered. A 52 mm outer diameter Pinnacle 100 acetabular component was positioned and impacted in place. Good scratch fit was appreciated. A +4 mm, 10-degree trial liner was inserted with the high side positioned at approximately the 4:00 position. Attention was then directed to the proximal femur. A pilot hole for reaming of the proximal femoral canal was created using a high-speed bur. The proximal femoral canal was reamed in a sequential fashion up to an 11.5 mm diameter. This allowed for approximately 6 cm of scratch fit. An aggressive side-biting reamer was then used to prepare the proximal femur. Serial broaches were inserted up to a 12 mm small stature. The calcar region was planed accordingly, and trial reduction was  performed using first an AML head and neck segment. Reasonably good stability was appreciated. However, it was felt that better stability was appreciated with  a Prodigy head and neck segment with a +5 mm neck length. The trial components were removed. The acetabular shell was irrigated with copious amounts of normal saline with antibiotic solution. A +4 mm, 10-degree Pinnacle Marathon polyethylene liner was positioned with the high side at the 4:00 position and impacted into place. Next, a 12 mm small stature Prodigy femoral component was positioned and impacted into place. Excellent scratch fit was appreciated. Trial reduction was again performed with a 36 mm trial ball with a + 5 mm neck segment. Excellent stability was appreciated, and good restoration of both hip offset and limb length was noted. The trial hip ball was removed. The Morse taper was cleaned and dried. A 36 mm outer diameter aSphere femoral head with  a +5 mm neck segment was placed on the trunnion and impacted in place. The hip was then reduced and placed through a range of motion. Excellent stability was noted both anteriorly and posteriorly. Good equalization of limb lengths was appreciated and excellent restoration of hip offset noted.   The hip was irrigated with copious amounts of normal saline with antibiotic solution using pulsatile lavage and then suctioned dry. The posterior capsulotomy was repaired using #5 Ethibond. The piriformis tendon was reapproximated using #5 Ethibond. Two medium drains were placed in the wound bed and brought out through a separate stab incision to be attached to a Hemovac reservoir. The IT band was prepared using interrupted sutures of #1 Vicryl. The subcutaneous tissue was approximated in layers using first #0 Vicryl, followed by #2-0 Vicryl. The skin was closed with skin staples. A sterile dressing was applied.   The patient tolerated the procedure well. She was transported to the recovery room in stable condition.    ____________________________ Laurice Record. Holley Bouche., MD jph:cbb D: 02/25/2012 17:36:26 ET T: 02/25/2012 18:08:15 ET JOB#: [id number]  cc: Jeneen Rinks  P. Holley Bouche., MD, <Dictator> JAMES P Holley Bouche MD ELECTRONICALLY SIGNED 02/29/2012 20:39

## 2015-05-01 ENCOUNTER — Ambulatory Visit (INDEPENDENT_AMBULATORY_CARE_PROVIDER_SITE_OTHER): Payer: PPO | Admitting: Cardiovascular Disease

## 2015-05-01 ENCOUNTER — Encounter: Payer: Self-pay | Admitting: Cardiovascular Disease

## 2015-05-01 VITALS — BP 138/60 | HR 60 | Ht 64.5 in | Wt 197.0 lb

## 2015-05-01 DIAGNOSIS — I255 Ischemic cardiomyopathy: Secondary | ICD-10-CM

## 2015-05-01 DIAGNOSIS — E785 Hyperlipidemia, unspecified: Secondary | ICD-10-CM

## 2015-05-01 DIAGNOSIS — I25118 Atherosclerotic heart disease of native coronary artery with other forms of angina pectoris: Secondary | ICD-10-CM

## 2015-05-01 NOTE — Patient Instructions (Addendum)
Your physician recommends that you continue on your current medications as directed. Please refer to the Current Medication list given to you today.  Your physician wants you to follow-up in: 6 months. You will receive a reminder letter in the mail two months in advance. If you don't receive a letter, please call our office to schedule the follow-up appointment.  

## 2015-05-01 NOTE — Progress Notes (Signed)
Primary care physician: Dr. [name]  HPI  This is a pleasant 74 year old female who is here today for a follow-up visit. She has known history of coronary artery disease status post CABG in 1994 with no ischemic cardiac events since then. 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 Most recent treadmill stress test was done in 2013 which was negative for ischemia. She was diagnosed with PVCs in 2015 and was started on small dose Toprol. She has no history of diabetes, hypertension or hyperlipidemia. She is not a smoker. There is family history of coronary artery disease. During her initial visit, she complained of dizziness and presyncope. She has underlying left bundle branch block. I proceeded with a Holter monitor which showed normal sinus rhythm with short runs of SVT and nonsustained ventricular tachycardia. Echocardiogram showed normal LV systolic function with an ejection fraction of 55-60%, mild mitral regurgitation, moderately dilated left atrium and mild pulmonary hypertension. Due to significant exertional dyspnea, I proceeded with a pharmacologic nuclear stress test last month which showed no evidence of ischemia with ejection fraction of 48%.  Allergies  Allergen Reactions  . Ivp Dye [Iodinated Diagnostic Agents] Swelling  . Sulfa Antibiotics      Current Outpatient Prescriptions on File Prior to Visit  Medication Sig Dispense Refill  . Budesonide-Formoterol Fumarate (SYMBICORT IN) Inhale into the lungs as needed.    . calcium carbonate (OS-CAL) 600 MG TABS tablet Take 600 mg by mouth 2 (two) times daily with a meal.    . fluticasone (FLONASE) 50 MCG/ACT nasal spray Place 1 spray into both nostrils daily.    . meloxicam (MOBIC) 7.5 MG tablet Take 7.5 mg by mouth daily.    . metoprolol succinate (TOPROL-XL) 25 MG 24 hr tablet Take 25 mg by mouth daily.    . Multiple Vitamin (MULTIVITAMIN) tablet Take 1  tablet by mouth daily.    . naproxen sodium (ANAPROX) 220 MG tablet Take 220 mg by mouth 2 (two) times daily with a meal.    . venlafaxine (EFFEXOR) 75 MG tablet Take 75 mg by mouth daily.      No current facility-administered medications on file prior to visit.     Past Medical History  Diagnosis Date  . Gastroesophageal reflux disease   . Hx of breast cancer   . MI (myocardial infarction)   . Hypertension   . CAD (coronary artery disease)     CABG in 1994  . PVC's (premature ventricular contractions)   . Ischemic cardiomyopathy     Most recent ejection fraction was 40% in 2013  . Rheumatoid arthritis      Past Surgical History  Procedure Laterality Date  . Coronary artery bypass graft    . Breast lumpectomy    . Cardiac catheterization      [facility]  . Cardiac catheterization      [facility]     Family History  Problem Relation Age of Onset  . Heart attack Father      History   Social History  . Marital Status: Divorced    Spouse Name: N/A  . Number of Children: N/A  . Years of Education: N/A   Occupational History  . Not on file.   Social History Main Topics  . Smoking status: Former Smoker -- 0.50 packs/day for 35 years    Types: Cigarettes  . Smokeless tobacco: Not on file  . Alcohol Use: No  . Drug Use:  No  . Sexual Activity: Not on file   Other Topics Concern  . Not on file   Social History Narrative     ROS A 10 point review of system was performed. It is negative other than that mentioned in the history of present illness.   PHYSICAL EXAM   BP 138/60 mmHg  Pulse 60  Ht 5' 4.5" (1.638 m)  Wt 197 lb (89.359 kg)  BMI 33.31 kg/m2 Constitutional: She is oriented to person, place, and time. She appears well-developed and well-nourished. No distress.  HENT: No nasal discharge.  Head: Normocephalic and atraumatic.  Eyes: Pupils are equal and round. No discharge.  Neck: Normal range of motion. Neck supple. No JVD present. No thyromegaly present.    Cardiovascular: Normal rate, regular rhythm, normal heart sounds. Exam reveals no gallop and no friction rub. No murmur heard.  Pulmonary/Chest: Effort normal and breath sounds normal. No stridor. No respiratory distress. She has no wheezes. She has no rales. She exhibits no tenderness.  Abdominal: Soft. Bowel sounds are normal. She exhibits no distension. There is no tenderness. There is no rebound and no guarding.  Musculoskeletal: Normal range of motion. She exhibits no edema and no tenderness.  Neurological: She is alert and oriented to person, place, and time. Coordination normal.  Skin: Skin is warm and dry. No rash noted. She is not diaphoretic. No erythema. No pallor.  Psychiatric: She has a normal mood and affect. Her behavior is normal. Judgment and thought content normal.       ASSESSMENT AND PLAN

## 2015-05-01 NOTE — Assessment & Plan Note (Signed)
Most recent ejection fraction was 40%. Continue small dose Toprol. There is no evidence of fluid overload.

## 2015-05-01 NOTE — Assessment & Plan Note (Signed)
She is doing reasonably well with no convincing symptoms of angina. Recent nuclear stress test showed no evidence of ischemia. I advised her to take aspirin daily.

## 2015-05-01 NOTE — Assessment & Plan Note (Signed)
There is a strong indication for treatment with a statin. However, the patient is concerned about potential myalgia.

## 2015-06-10 DIAGNOSIS — Z96652 Presence of left artificial knee joint: Secondary | ICD-10-CM | POA: Insufficient documentation

## 2015-07-16 ENCOUNTER — Ambulatory Visit (INDEPENDENT_AMBULATORY_CARE_PROVIDER_SITE_OTHER): Payer: PPO | Admitting: Family Medicine

## 2015-07-16 ENCOUNTER — Encounter: Payer: Self-pay | Admitting: Family Medicine

## 2015-07-16 DIAGNOSIS — L409 Psoriasis, unspecified: Secondary | ICD-10-CM | POA: Insufficient documentation

## 2015-07-16 DIAGNOSIS — R319 Hematuria, unspecified: Secondary | ICD-10-CM

## 2015-07-16 LAB — POCT UA - MICROSCOPIC ONLY
Bacteria, U Microscopic: NEGATIVE
Casts, Ur, LPF, POC: NEGATIVE
Crystals, Ur, HPF, POC: NEGATIVE
Epithelial cells, urine per micros: NEGATIVE
Mucus, UA: NEGATIVE
RBC, urine, microscopic: NEGATIVE
WBC, Ur, HPF, POC: NEGATIVE
Yeast, UA: NEGATIVE

## 2015-07-16 LAB — POCT URINALYSIS DIPSTICK
Bilirubin, UA: NEGATIVE
Glucose, UA: NEGATIVE
Ketones, UA: NEGATIVE
Leukocytes, UA: NEGATIVE
Nitrite, UA: NEGATIVE
Protein, UA: NEGATIVE
Spec Grav, UA: >=1.03
Urobilinogen, UA: 0.2
pH, UA: 6.5

## 2015-07-16 MED ORDER — CIPROFLOXACIN HCL 250 MG PO TABS: 250.0000 mg | ORAL_TABLET | Freq: Two times a day (BID) | ORAL | Status: DC

## 2015-07-16 NOTE — Progress Notes (Signed)
Patient ID: Teresa [name], female   DOB: 30-Aug-1941, 74 y.o.   MRN: [id number]    Subjective:  HPI   Patient is here for acute visit. Patient states that she developed low back pain on Friday last week by Saturday she started not feeling well and on Sunday developed discomfort and pressure with urination, blood in the urine, urgency, frequency, strong urine odor, chills yesterday. She took AZO.  Symptoms are about the same, spasms improved with taking AZO.  Prior to Admission medications   Medication Sig Start Date End Date Taking? Authorizing Provider  Budesonide-Formoterol Fumarate (SYMBICORT IN) Inhale into the lungs as needed.    Historical Provider, MD  calcium carbonate (OS-CAL) 600 MG TABS tablet Take 600 mg by mouth 2 (two) times daily with a meal.    Historical Provider, MD  fluticasone (FLONASE) 50 MCG/ACT nasal spray Place 1 spray into both nostrils daily.    Historical Provider, MD  meclizine (ANTIVERT) 25 MG tablet Take 25 mg by mouth as needed.  04/03/15   Historical Provider, MD  meloxicam (MOBIC) 7.5 MG tablet Take 7.5 mg by mouth daily.    Historical Provider, MD  metoprolol succinate (TOPROL-XL) 25 MG 24 hr tablet Take 25 mg by mouth daily.    Historical Provider, MD  montelukast (SINGULAIR) 10 MG tablet Take 10 mg by mouth daily.  04/02/15   Historical Provider, MD  Multiple Vitamin (MULTIVITAMIN) tablet Take 1 tablet by mouth daily.    Historical Provider, MD  naproxen sodium (ANAPROX) 220 MG tablet Take 220 mg by mouth 2 (two) times daily with a meal.    Historical Provider, MD  venlafaxine (EFFEXOR) 75 MG tablet Take 75 mg by mouth daily.     Historical Provider, MD    Patient Active Problem List   Diagnosis Date Noted  . Psoriasis 07/16/2015  . Hyperlipidemia 03/29/2015  . Pre-syncope 03/04/2015  . CAD (coronary artery disease)   . PVC's (premature ventricular contractions)   . Ischemic cardiomyopathy   . DDD (degenerative disc disease), lumbar 01/18/2015  . L-S  radiculopathy 07/03/2014  . Breast CA 06/28/2014  . Arthritis, degenerative 06/28/2014  . Acid reflux 03/08/2014    Past Medical History  Diagnosis Date  . Gastroesophageal reflux disease   . Hx of breast cancer   . MI (myocardial infarction)   . Hypertension   . CAD (coronary artery disease)     CABG in 1994  . PVC's (premature ventricular contractions)   . Ischemic cardiomyopathy     Most recent ejection fraction was 40% in 2013  . Rheumatoid arthritis     History   Social History  . Marital Status: Divorced    Spouse Name: N/A  . Number of Children: N/A  . Years of Education: N/A   Occupational History  . Not on file.   Social History Main Topics  . Smoking status: Former Smoker -- 0.50 packs/day for 35 years    Types: Cigarettes  . Smokeless tobacco: Never Used  . Alcohol Use: No  . Drug Use: No  . Sexual Activity: Not on file   Other Topics Concern  . Not on file   Social History Narrative    Allergies  Allergen Reactions  . Ivp Dye [Iodinated Diagnostic Agents] Swelling  . Sulfa Antibiotics     Review of Systems  Constitutional: Positive for chills and malaise/fatigue. Negative for fever.  Gastrointestinal: Negative for heartburn, nausea, vomiting and abdominal pain.  Genitourinary: Positive for dysuria, urgency, frequency  and hematuria.  Musculoskeletal: Positive for back pain and joint pain. Negative for myalgias and neck pain.  Neurological: Negative for dizziness, weakness and headaches.   no abdominal pain/tenderness or CVA tenderness  Immunization History  Administered Date(s) Administered  . Influenza-Unspecified 07/22/2014   Objective:  BP 134/68 mmHg  Pulse 60  Temp(Src) 98.4 F (36.9 C)  Resp 14  Ht 5' 4.5" (1.638 m)  Wt 197 lb (89.359 kg)  BMI 33.31 kg/m2  Physical Exam  Lab Results  Component Value Date   WBC 6.5 12/06/2013   HGB 12.7 12/21/2013   HCT 47.5* 12/06/2013   PLT 187 12/21/2013   GLUCOSE 132* 12/21/2013    INR 0.9 12/06/2013    CMP     Component Value Date/Time   NA 138 12/21/2013 0447   NA 139 10/26/2007 1252   K 4.5 12/21/2013 0447   K 4.3 10/26/2007 1252   CL 105 12/21/2013 0447   CL 103 10/26/2007 1252   CO2 34* 12/21/2013 0447   CO2 30 10/26/2007 1252   GLUCOSE 132* 12/21/2013 0447   GLUCOSE 87 10/26/2007 1252   BUN 8 12/21/2013 0447   BUN 14 10/26/2007 1252   CREATININE 0.61 12/21/2013 0447   CREATININE 0.71 10/26/2007 1252   CALCIUM 8.5 12/21/2013 0447   CALCIUM 9.4 10/26/2007 1252   PROT 7.0 05/11/2013 1547   PROT 6.4 10/26/2007 1252   ALBUMIN 3.6 05/11/2013 1547   ALBUMIN 3.6 10/26/2007 1252   AST 23 05/11/2013 1547   AST 14 10/26/2007 1252   ALT 24 05/11/2013 1547   ALT 13 10/26/2007 1252   ALKPHOS 113 05/11/2013 1547   ALKPHOS 81 10/26/2007 1252   BILITOT 0.3 05/11/2013 1547   BILITOT 0.7 10/26/2007 1252   GFRNONAA >60 12/21/2013 0447   GFRNONAA >60 10/26/2007 1252   GFRAA >60 12/21/2013 0447   GFRAA  10/26/2007 1252    >60        The eGFR has been calculated using the MDRD equation. This calculation has not been validated in all clinical    Assessment and Plan :  1. Hematuria/UTI New. Will go ahead and treat with Cipro and send urine for culture. Follow.  - Urine culture - POCT Urinalysis Dipstick - POCT UA - Microscopic Only      Patient was seen and examined by Dr. Eulas Post and note was scribed by Theressa Millard, RMA.    Miguel Aschoff MD [facility] 07/16/2015 11:48 AM

## 2015-07-17 ENCOUNTER — Encounter: Payer: Self-pay | Admitting: Family Medicine

## 2015-07-17 LAB — URINE CULTURE

## 2015-07-18 ENCOUNTER — Telehealth: Payer: Self-pay | Admitting: Family Medicine

## 2015-07-18 NOTE — Telephone Encounter (Signed)
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 CB# is in contact info and in as work#. Thanks TNP

## 2015-07-18 NOTE — Telephone Encounter (Signed)
LMTCB  aa 

## 2015-08-20 ENCOUNTER — Other Ambulatory Visit: Payer: Self-pay | Admitting: Family Medicine

## 2015-09-05 ENCOUNTER — Ambulatory Visit (INDEPENDENT_AMBULATORY_CARE_PROVIDER_SITE_OTHER): Payer: PPO | Admitting: Physician Assistant

## 2015-09-05 ENCOUNTER — Encounter: Payer: Self-pay | Admitting: Physician Assistant

## 2015-09-05 VITALS — BP 124/62 | HR 65 | Temp 98.0°F | Resp 16 | Ht 65.0 in | Wt 196.2 lb

## 2015-09-05 DIAGNOSIS — J069 Acute upper respiratory infection, unspecified: Secondary | ICD-10-CM | POA: Diagnosis not present

## 2015-09-05 MED ORDER — AZITHROMYCIN 250 MG PO TABS: ORAL_TABLET | ORAL | Status: DC

## 2015-09-06 NOTE — Progress Notes (Signed)
   Subjective:    Patient ID: Teresa [name], female    DOB: Oct 10, 1941, 74 y.o.   MRN: [id number]  HPI Patient comes in office today with concerns of cold like symptoms for the past 48hrs. Patient complaints of sinus pain and pressure around her head, congestion, itching of her ears and throat. Patient reports that cough is now productive of yellow phlegm and that her daughter has similar symptoms. Patient has tried taking otc: Singulair and Emergen-C and Flonase with little relief.   Review of Systems  Constitutional: Positive for fatigue. Negative for fever and chills.  HENT: Positive for congestion, postnasal drip, rhinorrhea, sinus pressure, sneezing and sore throat. Negative for ear discharge, ear pain, tinnitus, trouble swallowing and voice change.   Eyes: Negative.   Respiratory: Positive for cough and shortness of breath. Negative for chest tightness and wheezing.   Cardiovascular: Negative.        Objective:   Physical Exam  Constitutional: She appears well-developed and well-nourished. No distress.  HENT:  Head: Normocephalic and atraumatic.  Right Ear: Hearing, tympanic membrane, external ear and ear canal normal.  Left Ear: Hearing, tympanic membrane, external ear and ear canal normal.  Nose: Mucosal edema and rhinorrhea present. Right sinus exhibits maxillary sinus tenderness. Right sinus exhibits no frontal sinus tenderness. Left sinus exhibits maxillary sinus tenderness. Left sinus exhibits no frontal sinus tenderness.  Mouth/Throat: Uvula is midline and mucous membranes are normal. Posterior oropharyngeal erythema (mild) present. No oropharyngeal exudate.  Eyes: Conjunctivae are normal. Pupils are equal, round, and reactive to light. Right eye exhibits no discharge. Left eye exhibits no discharge. No scleral icterus.  Neck: Normal range of motion. Neck supple. No tracheal deviation present. No thyromegaly present.  Cardiovascular: Normal rate, regular rhythm and normal  heart sounds.  Exam reveals no gallop and no friction rub.   No murmur heard. Pulmonary/Chest: Effort normal and breath sounds normal. No stridor. No respiratory distress. She has no wheezes. She has no rales.  Lymphadenopathy:    She has no cervical adenopathy.  Skin: Skin is warm and dry. She is not diaphoretic.  Vitals reviewed.         Assessment & Plan:  1. Upper respiratory infection Will treat as below due to the fact that she is having to travel over the weekend for a wedding and is nervous she is going to get sicker from the travel and being around a lot of people. She had pneumonia previously and does not want to get that sick again.  She is to call the office if symptoms worsen or do not improve with treatment. - azithromycin (ZITHROMAX) 250 MG tablet; Take 2 tablets PO on day one, and one tablet PO daily thereafter until completed.  Dispense: 6 tablet; Refill: 0\

## 2015-09-11 DIAGNOSIS — I493 Ventricular premature depolarization: Secondary | ICD-10-CM | POA: Insufficient documentation

## 2015-09-13 ENCOUNTER — Encounter: Payer: Self-pay | Admitting: Family Medicine

## 2015-09-22 ENCOUNTER — Other Ambulatory Visit: Payer: Self-pay | Admitting: Family Medicine

## 2015-09-22 DIAGNOSIS — I10 Essential (primary) hypertension: Secondary | ICD-10-CM

## 2015-09-24 ENCOUNTER — Other Ambulatory Visit: Payer: Self-pay | Admitting: Family Medicine

## 2015-09-24 DIAGNOSIS — I10 Essential (primary) hypertension: Secondary | ICD-10-CM

## 2015-09-24 MED ORDER — METOPROLOL SUCCINATE ER 25 MG PO TB24
25.0000 mg | ORAL_TABLET | Freq: Every day | ORAL | Status: DC
Start: 2015-09-24 — End: 2016-09-22

## 2015-09-24 NOTE — Telephone Encounter (Signed)
Pt needs refill metoprolol succinate (TOPROL-XL) 25 MG 24 hr tablet  She is completely out.  Her call back is [phone]   Thanks teri

## 2015-09-24 NOTE — Telephone Encounter (Signed)
Teresa [name], I think she may have become your patient.  If not send back to me. Thanks  ED

## 2015-09-24 NOTE — Telephone Encounter (Signed)
Done-aa 

## 2015-10-04 ENCOUNTER — Encounter: Payer: Self-pay | Admitting: Family Medicine

## 2015-10-04 ENCOUNTER — Ambulatory Visit (INDEPENDENT_AMBULATORY_CARE_PROVIDER_SITE_OTHER): Payer: PPO | Admitting: Family Medicine

## 2015-10-04 VITALS — BP 134/62 | HR 68 | Temp 97.5°F | Resp 16 | Ht 65.0 in | Wt 194.0 lb

## 2015-10-04 DIAGNOSIS — Z23 Encounter for immunization: Secondary | ICD-10-CM

## 2015-10-04 DIAGNOSIS — R928 Other abnormal and inconclusive findings on diagnostic imaging of breast: Secondary | ICD-10-CM

## 2015-10-04 DIAGNOSIS — N3945 Continuous leakage: Secondary | ICD-10-CM

## 2015-10-04 DIAGNOSIS — Z1211 Encounter for screening for malignant neoplasm of colon: Secondary | ICD-10-CM | POA: Diagnosis not present

## 2015-10-04 DIAGNOSIS — Z0000 Encounter for general adult medical examination without abnormal findings: Secondary | ICD-10-CM | POA: Diagnosis not present

## 2015-10-04 LAB — IFOBT (OCCULT BLOOD): IFOBT: NEGATIVE

## 2015-10-04 MED ORDER — OMEPRAZOLE 20 MG PO CPDR
20.0000 mg | DELAYED_RELEASE_CAPSULE | Freq: Every day | ORAL | Status: DC
Start: 2015-10-04 — End: 2016-04-07

## 2015-10-04 NOTE — Progress Notes (Signed)
Patient ID: Teresa [name], female   DOB: September 06, 1941, 74 y.o.   MRN: [id number]       Patient: Teresa [name], Female    DOB: 1941-08-10, 74 y.o.   MRN: [id number] Visit Date: 10/04/2015  Today's Provider: [name], MD   Chief Complaint  Patient presents with  . Annual Exam  . Urinary Incontinence    ongoing for several months. Wants to discuss medication.    Subjective:    Annual wellness visit Teresa [name] is a 74 y.o. female. She feels well. She reports exercising occasionally. She reports she is sleeping well.  Last: Colonoscopy- 04/10/2008  Pap- 08/09/2008  Mammogram- 01/04/2015, needs repeat in 05/2015 due to hx of Breast cancer.   EKG- 01/22/2012  BMD- 06/02/2007  Pneumonia 23- 11/13/2009  Prevnar- 07/25/2014  ----------------------------------------------------------- Urinary Incontinence  Patient reports that she has to wear Poise pads due to the leakage that she has throughout the day. She reports that she has trouble controlling her urine, and will leak occasionally. Patient reports that it has gotten worse over the past few weeks.   Review of Systems  Constitutional: Negative.   HENT: Negative.   Eyes: Negative.   Respiratory: Negative.   Cardiovascular: Negative.   Gastrointestinal: Negative.   Endocrine: Negative.   Genitourinary: Positive for difficulty urinating.  Musculoskeletal: Negative.   Skin: Negative.   Neurological: Negative.   Hematological: Negative.   Psychiatric/Behavioral: Negative.     Social History   Social History  . Marital Status: Divorced    Spouse Name: N/A  . Number of Children: N/A  . Years of Education: N/A   Occupational History  . Not on file.   Social History Main Topics  . Smoking status: Former Smoker -- 0.50 packs/day for 35 years    Types: Cigarettes  . Smokeless tobacco: Never Used  . Alcohol Use: No  . Drug Use: No  . Sexual Activity: Not on file   Other Topics Concern  . Not on file   Social  History Narrative    Patient Active Problem List   Diagnosis Date Noted  . Psoriasis 07/16/2015  . Hyperlipidemia 03/29/2015  . Pre-syncope 03/04/2015  . CAD (coronary artery disease)   . PVC's (premature ventricular contractions)   . Ischemic cardiomyopathy   . DDD (degenerative disc disease), lumbar 01/18/2015  . L-S radiculopathy 07/03/2014  . Breast CA ([place]) 06/28/2014  . Arthritis, degenerative 06/28/2014  . Acid reflux 03/08/2014    Past Surgical History  Procedure Laterality Date  . Coronary artery bypass graft    . Breast lumpectomy    . Cardiac catheterization      [facility]  . Cardiac catheterization      MC  . Abdominal hysterectomy    . Cholecystectomy    . Total hip arthroplasty Left   . Total knee arthroplasty Left   . Foot fracture surgery      Her family history includes Arthritis in her brother, sister, and sister; Bladder Cancer in her brother; Breast cancer in her mother, sister, and sister; Congestive Heart Failure in her mother; Heart attack in her father; Kidney failure in her sister; Lung cancer in her sister; Prostate cancer in her brother.    Previous Medications   AZITHROMYCIN (ZITHROMAX) 250 MG TABLET    Take 2 tablets PO on day one, and one tablet PO daily thereafter until completed.   BUDESONIDE-FORMOTEROL FUMARATE (SYMBICORT IN)    Inhale into the lungs as needed.   CALCIUM  CARBONATE (OS-CAL) 600 MG TABS TABLET    Take 600 mg by mouth 2 (two) times daily with a meal.   FLUTICASONE (FLONASE) 50 MCG/ACT NASAL SPRAY    Place 1 spray into both nostrils daily.   MECLIZINE (ANTIVERT) 25 MG TABLET    Take 25 mg by mouth as needed.    MELOXICAM (MOBIC) 7.5 MG TABLET    Take 7.5 mg by mouth daily.   METOPROLOL SUCCINATE (TOPROL-XL) 25 MG 24 HR TABLET    Take 1 tablet (25 mg total) by mouth daily.   MONTELUKAST (SINGULAIR) 10 MG TABLET    Take 10 mg by mouth daily.    MULTIPLE VITAMIN (MULTIVITAMIN) TABLET    Take 1 tablet by mouth daily.   NAPROXEN SODIUM  (ANAPROX) 220 MG TABLET    Take 220 mg by mouth 2 (two) times daily with a meal.   VENLAFAXINE (EFFEXOR) 75 MG TABLET    Take 75 mg by mouth daily.     Patient Care Team: Jerrol Banana., MD as PCP - General (Family Medicine)     Objective:   Vitals: BP 134/62 mmHg  Pulse 68  Temp(Src) 97.5 F (36.4 C)  Resp 16  Ht 5\' 5"  (1.651 m)  Wt 194 lb (87.998 kg)  BMI 32.28 kg/m2  Physical Exam  Constitutional: She is oriented to person, place, and time. She appears well-developed and well-nourished.  HENT:  Head: Normocephalic and atraumatic.  Right Ear: External ear normal.  Left Ear: External ear normal.  Nose: Nose normal.  Eyes: Conjunctivae are normal.  Neck: Neck supple.  Pulmonary/Chest: Effort normal and breath sounds normal.  Abdominal: Soft.  Genitourinary: Vagina normal.  Mild cystocele noted. Manual exam benign  Neurological: She is alert and oriented to person, place, and time.  Skin: Skin is warm and dry.  Psychiatric: She has a normal mood and affect. Her behavior is normal. Judgment and thought content normal.    Activities of Daily Living In your present state of health, do you have any difficulty performing the following activities: 10/04/2015 07/16/2015  Hearing? N N  Vision? N N  Difficulty concentrating or making decisions? N N  Walking or climbing stairs? N Y  Dressing or bathing? N N  Doing errands, shopping? N N    Fall Risk Assessment Fall Risk  10/04/2015 07/16/2015  Falls in the past year? No No     Depression Screen PHQ 2/9 Scores 10/04/2015 07/16/2015  PHQ - 2 Score 0 0    Cognitive Testing - 6-CIT  Correct? Score   What year is it? yes 0 0 or 4  What month is it? yes 0 0 or 3  Memorize:    Pia Mau,  [birth year],  [place],      What time is it? (within 1 hour) yes 0 0 or 3  Count backwards from 20 yes 0 0, 2, or 4  Name the months of the year no 2 0, 2, or 4  Repeat name & address above yes 0 0, 2, 4, 6, 8, or 10         TOTAL SCORE  2/28   Interpretation:  Normal  Normal (0-7) Abnormal (8-28)       Assessment & Plan:     Annual Wellness Visit  Reviewed patient's Family Medical History Reviewed and updated list of patient's medical providers Assessment of cognitive impairment was done Assessed patient's functional ability Established a written schedule for health screening  services Health Risk Assessent Completed and Reviewed  Exercise Activities and Dietary recommendations Goals    None      Immunization History  Administered Date(s) Administered  . Influenza-Unspecified 07/22/2014    Health Maintenance  Topic Date Due  . TETANUS/TDAP  09/19/1960  . MAMMOGRAM  09/20/1991  . COLONOSCOPY  09/20/1991  . ZOSTAVAX  09/19/2001  . DEXA SCAN  09/19/2006  . PNA vac Low Risk Adult (1 of 2 - PCV13) 09/19/2006  . INFLUENZA VACCINE  07/23/2015   Family history-both parents were alcoholics. Father died at 59 with heart disease and he was a nonsmoker. Mother died of 29 years old.   Discussed health benefits of physical activity, and encouraged her to engage in regular exercise appropriate for her age and condition.   Urinary incontinence Refer to uro- gynecology as symptoms are fairly significant GERD Try omeprazole 20 mg daily I have done the exam and reviewed the above chart and it is accurate to the best of my knowledge.  ------------------------------------------------------------------------------------------------------------

## 2015-10-17 ENCOUNTER — Ambulatory Visit: Payer: PPO

## 2015-10-17 ENCOUNTER — Other Ambulatory Visit: Payer: PPO

## 2015-11-01 ENCOUNTER — Other Ambulatory Visit: Payer: Self-pay | Admitting: Family Medicine

## 2015-11-01 ENCOUNTER — Ambulatory Visit
Admission: RE | Admit: 2015-11-01 | Discharge: 2015-11-01 | Disposition: A | Discharge status: Home / Self Care | Payer: PPO | Source: Ambulatory Visit | Attending: Family Medicine | Admitting: Family Medicine

## 2015-11-01 DIAGNOSIS — R928 Other abnormal and inconclusive findings on diagnostic imaging of breast: Secondary | ICD-10-CM

## 2015-11-01 DIAGNOSIS — R921 Mammographic calcification found on diagnostic imaging of breast: Secondary | ICD-10-CM | POA: Diagnosis not present

## 2015-12-18 ENCOUNTER — Ambulatory Visit: Payer: PPO | Admitting: Nurse Practitioner

## 2016-01-21 ENCOUNTER — Other Ambulatory Visit: Payer: Self-pay

## 2016-01-21 MED ORDER — MELOXICAM 7.5 MG PO TABS: 7.5000 mg | ORAL_TABLET | Freq: Every day | ORAL | Status: DC

## 2016-01-21 NOTE — Telephone Encounter (Signed)
Tried to call pt to verify pharmacy and advise of below. LMTCB

## 2016-01-21 NOTE — Telephone Encounter (Signed)
email from patient requesting refill on Meloxicam. Please review.-aa

## 2016-01-21 NOTE — Telephone Encounter (Signed)
Pt informed. Med sent to pharmacy.  

## 2016-01-21 NOTE — Telephone Encounter (Signed)
One-month refill. We'll see her back for any further. We  Need todiscuss risk and benefits of this med

## 2016-01-22 ENCOUNTER — Ambulatory Visit: Payer: PPO | Admitting: Nurse Practitioner

## 2016-01-30 ENCOUNTER — Telehealth: Payer: Self-pay | Admitting: Family Medicine

## 2016-01-30 DIAGNOSIS — M069 Rheumatoid arthritis, unspecified: Secondary | ICD-10-CM

## 2016-01-30 NOTE — Telephone Encounter (Signed)
Pt would like a referral to Dr. Meda Coffee with [facility] for her rheumatoid arthritis. Pt was advised that Dr. [name] is out of the office this week. Thanks TNP

## 2016-01-30 NOTE — Telephone Encounter (Signed)
Is this ok?-aa 

## 2016-01-30 NOTE — Telephone Encounter (Signed)
Spoke with patient and we had labs checked in November 2015 and RA was positive we sent her to Dr. Jefm Bryant but she has not been able to really get much help from him, she was not satisfied and she discussed this with you on her last visit she states. Order put in and sent to [facility].  This is a message back as an fiy:)-aa

## 2016-01-30 NOTE — Telephone Encounter (Signed)
I cannot find where Dx of RA made.   It is in chart but I do not remember it . Definite OA and DDD. If it has been made then referral is ok. If not then maybe see me next week to figure out most appropriate treatment or referral. i am ok with referral overall but not completely sure of Dx with limited access to old chart and old labs. thx

## 2016-01-30 NOTE — Telephone Encounter (Signed)
Ok thanks 

## 2016-01-31 DIAGNOSIS — M151 Heberden's nodes (with arthropathy): Secondary | ICD-10-CM | POA: Diagnosis not present

## 2016-01-31 DIAGNOSIS — R5382 Chronic fatigue, unspecified: Secondary | ICD-10-CM | POA: Diagnosis not present

## 2016-01-31 DIAGNOSIS — M180 Bilateral primary osteoarthritis of first carpometacarpal joints: Secondary | ICD-10-CM | POA: Insufficient documentation

## 2016-01-31 DIAGNOSIS — M25542 Pain in joints of left hand: Secondary | ICD-10-CM | POA: Insufficient documentation

## 2016-01-31 DIAGNOSIS — M25541 Pain in joints of right hand: Secondary | ICD-10-CM | POA: Diagnosis not present

## 2016-01-31 DIAGNOSIS — M19041 Primary osteoarthritis, right hand: Secondary | ICD-10-CM | POA: Diagnosis not present

## 2016-01-31 DIAGNOSIS — M19042 Primary osteoarthritis, left hand: Secondary | ICD-10-CM | POA: Diagnosis not present

## 2016-01-31 DIAGNOSIS — R768 Other specified abnormal immunological findings in serum: Secondary | ICD-10-CM | POA: Diagnosis not present

## 2016-01-31 DIAGNOSIS — L409 Psoriasis, unspecified: Secondary | ICD-10-CM | POA: Diagnosis not present

## 2016-01-31 DIAGNOSIS — M5417 Radiculopathy, lumbosacral region: Secondary | ICD-10-CM | POA: Diagnosis not present

## 2016-02-20 DIAGNOSIS — M5136 Other intervertebral disc degeneration, lumbar region: Secondary | ICD-10-CM | POA: Diagnosis not present

## 2016-02-20 DIAGNOSIS — M5416 Radiculopathy, lumbar region: Secondary | ICD-10-CM | POA: Diagnosis not present

## 2016-03-03 ENCOUNTER — Ambulatory Visit (INDEPENDENT_AMBULATORY_CARE_PROVIDER_SITE_OTHER): Payer: PPO | Admitting: Cardiovascular Disease

## 2016-03-03 ENCOUNTER — Encounter: Payer: Self-pay | Admitting: Cardiovascular Disease

## 2016-03-03 VITALS — BP 142/82 | HR 64 | Ht 64.5 in | Wt 199.0 lb

## 2016-03-03 DIAGNOSIS — I25118 Atherosclerotic heart disease of native coronary artery with other forms of angina pectoris: Secondary | ICD-10-CM

## 2016-03-03 DIAGNOSIS — I255 Ischemic cardiomyopathy: Secondary | ICD-10-CM | POA: Diagnosis not present

## 2016-03-03 DIAGNOSIS — I493 Ventricular premature depolarization: Secondary | ICD-10-CM

## 2016-03-03 MED ORDER — ASPIRIN EC 81 MG PO TBEC: 81.0000 mg | DELAYED_RELEASE_TABLET | Freq: Every day | ORAL | Status: DC

## 2016-03-03 NOTE — Patient Instructions (Signed)
Medication Instructions: Start taking Aspirin 81 mg once daily.   Labwork: None.   Procedures/Testing: None.   Follow-Up: 6 months with Dr. Fletcher Anon.   Any Additional Special Instructions Will Be Listed Below (If Applicable).     If you need a refill on your cardiac medications before your next appointment, please call your pharmacy.

## 2016-03-03 NOTE — Progress Notes (Signed)
Cardiology Office Note   Date:  03/03/2016   ID:  Teresa [name], DOB 1941/05/20, MRN [id number]  PCP:  [name], MD  Cardiologist:   Kathlyn Sacramento, MD   Chief Complaint  Patient presents with  . other    6 month follow up. Meds reviewed by the patient verbally. "doing well."       History of Present Illness: Teresa [name] is a 75 y.o. female who presents for a follow-up visit regarding coronary artery disease. She is status post CABG in 1994 with no ischemic cardiac events since then. She is known to have mild ischemic cardiomyopathy .  She was diagnosed with PVCs in 2015 and was started on small dose Toprol.She does have underlying left bundle branch block. She has no history of diabetes, hypertension or hyperlipidemia. She is not a smoker. There is family history of coronary artery disease. She had cardiac workup in March 2016 for dizziness and presyncope. Holter monitor  showed normal sinus rhythm with short runs of SVT and nonsustained ventricular tachycardia. Echocardiogram showed normal LV systolic function with an ejection fraction of 55-60%, mild mitral regurgitation, moderately dilated left atrium and mild pulmonary hypertension. Pharmacologic nuclear stress test in April 2016 showed no evidence of ischemia with ejection fraction of 48%. She has been doing very well and denies any chest pain or shortness of breath. She reports no further dizziness after she was started on meclizine.    Past Medical History  Diagnosis Date  . Gastroesophageal reflux disease   . Hx of breast cancer   . MI (myocardial infarction) ([place])   . Hypertension   . CAD (coronary artery disease)     CABG in 1994  . PVC's (premature ventricular contractions)   . Ischemic cardiomyopathy     Most recent ejection fraction was 40% in 2013  . Rheumatoid arthritis ([place])   . Cancer [facility])     breast cancer 2009    Past Surgical History  Procedure Laterality Date  . Coronary artery  bypass graft    . Breast lumpectomy    . Cardiac catheterization      MC  . Cardiac catheterization      MC  . Abdominal hysterectomy    . Cholecystectomy    . Total hip arthroplasty Left   . Total knee arthroplasty Left   . Foot fracture surgery    . Breast biopsy Right     1991 negative  . Breast excisional biopsy Left     2009 positive     Current Outpatient Prescriptions  Medication Sig Dispense Refill  . Budesonide-Formoterol Fumarate (SYMBICORT IN) Inhale into the lungs as needed.    . calcium carbonate (OS-CAL) 600 MG TABS tablet Take 600 mg by mouth 2 (two) times daily with a meal.    . fluticasone (FLONASE) 50 MCG/ACT nasal spray Place 1 spray into both nostrils daily.    . meclizine (ANTIVERT) 25 MG tablet Take 25 mg by mouth as needed.   1  . meloxicam (MOBIC) 7.5 MG tablet Take 1 tablet (7.5 mg total) by mouth daily. 30 tablet 0  . metoprolol succinate (TOPROL-XL) 25 MG 24 hr tablet Take 1 tablet (25 mg total) by mouth daily. 30 tablet 5  . montelukast (SINGULAIR) 10 MG tablet Take 10 mg by mouth daily.   12  . Multiple Vitamin (MULTIVITAMIN) tablet Take 1 tablet by mouth daily.    . naproxen sodium (ANAPROX) 220 MG tablet Take 220 mg  by mouth 2 (two) times daily with a meal.    . omeprazole (PRILOSEC) 20 MG capsule Take 1 capsule (20 mg total) by mouth daily. 30 capsule 3  . venlafaxine (EFFEXOR) 75 MG tablet Take 75 mg by mouth daily.     . Vitamin D, Ergocalciferol, (DRISDOL) 50000 units CAPS capsule Take 50,000 Units by mouth every 7 (seven) days.    Marland Kitchen aspirin EC 81 MG tablet Take 1 tablet (81 mg total) by mouth daily. 90 tablet 3   No current facility-administered medications for this visit.    Allergies:   Ivp dye and Sulfa antibiotics    Social History:  The patient  reports that she has quit smoking. Her smoking use included Cigarettes. She has a 17.5 pack-year smoking history. She has never used smokeless tobacco. She reports that she does not drink  alcohol or use illicit drugs.   Family History:  The patient's family history includes Arthritis in her brother, sister, and sister; Bladder Cancer in her brother; Breast cancer in her mother, sister, and sister; Congestive Heart Failure in her mother; Heart attack in her father; Kidney failure in her sister; Lung cancer in her sister; Prostate cancer in her brother.    ROS:  Please see the history of present illness.   Otherwise, review of systems are positive for none.   All other systems are reviewed and negative.    PHYSICAL EXAM: VS:  BP 142/82 mmHg  Pulse 64  Ht 5' 4.5" (1.638 m)  Wt 199 lb (90.266 kg)  BMI 33.64 kg/m2 , BMI Body mass index is 33.64 kg/(m^2). GEN: Well nourished, well developed, in no acute distress HEENT: normal Neck: no JVD, carotid bruits, or masses Cardiac: RRR; no  rubs, or gallops,no edema . There is one out of 6 systolic ejection murmur in the aortic area  Respiratory:  clear to auscultation bilaterally, normal work of breathing GI: soft, nontender, nondistended, + BS MS: no deformity or atrophy Skin: warm and dry, no rash Neuro:  Strength and sensation are intact Psych: euthymic mood, full affect   EKG:  EKG is ordered today. The ekg ordered today demonstrates normal sinus rhythm with left bundle branch block.  Recent Labs: No results found for requested labs within last 365 days.    Lipid Panel No results found for: CHOL, TRIG, HDL, CHOLHDL, VLDL, LDLCALC, LDLDIRECT    Wt Readings from Last 3 Encounters:  03/03/16 199 lb (90.266 kg)  10/04/15 194 lb (87.998 kg)  09/05/15 196 lb 3.2 oz (88.996 kg)        ASSESSMENT AND PLAN:  1.  Coronary artery disease involving native coronary arteries without angina: She is overall doing very well. Stress test last year showed no evidence of ischemia. I advised her to start taking aspirin 81 mg once daily. She hasn't been doing so because she takes meloxicam daily. I reviewed the patient's labs that  were done at the [facility] screening event. Hemoglobin A1c was 5.3. Lipid profile showed a total cholesterol of 182, HDL of 67, triglyceride of 208 and an LDL of 73. We discussed the importance of improving diet to decrease triglyceride. Her LDL was close to 70 and she is overall not in favor of starting statin therapy due to fear of side effects.  2. Mild ischemic cardiomyopathy: Most recent ejection fraction was normal by echo but 48% by nuclear stress test. She is currently on small dose Toprol. We might consider adding a small dose lisinopril/ARB in the future  but she is overall hesitant about starting any new medications.  3. Essential hypertension: Blood pressure is reasonably controlled on Toprol.     Disposition:   FU with me in 6 months  Signed,  Kathlyn Sacramento, MD  03/03/2016 3:26 PM    [place]

## 2016-04-07 ENCOUNTER — Other Ambulatory Visit: Payer: Self-pay | Admitting: Family Medicine

## 2016-04-07 DIAGNOSIS — Z1231 Encounter for screening mammogram for malignant neoplasm of breast: Secondary | ICD-10-CM

## 2016-04-07 DIAGNOSIS — Z853 Personal history of malignant neoplasm of breast: Secondary | ICD-10-CM

## 2016-04-07 MED ORDER — OMEPRAZOLE 20 MG PO CPDR: 20.0000 mg | DELAYED_RELEASE_CAPSULE | Freq: Every day | ORAL | Status: DC

## 2016-04-07 NOTE — Telephone Encounter (Signed)
Advised patient as below. Orders placed for mammogram. Medication was sent into the pharmacy.

## 2016-04-07 NOTE — Telephone Encounter (Signed)
Pt stated that she gets a diagnostic mammogram every 6 months of her left breast. Pt stated that it is time for that to be scheduled. Pt would like a call back to keep her updated about the appt. Pt would like to be scheduled for an afternoon appt with [facility].  Pt contacted office for refill request on the following medications: omeprazole (PRILOSEC) 20 MG capsule to [facility].  Please advise. Thanks TNP

## 2016-04-07 NOTE — Telephone Encounter (Signed)
Ok on both?

## 2016-04-07 NOTE — Telephone Encounter (Signed)
Ok to order and refill?

## 2016-04-08 DIAGNOSIS — L409 Psoriasis, unspecified: Secondary | ICD-10-CM | POA: Diagnosis not present

## 2016-04-08 DIAGNOSIS — M150 Primary generalized (osteo)arthritis: Secondary | ICD-10-CM | POA: Diagnosis not present

## 2016-04-08 DIAGNOSIS — Z79899 Other long term (current) drug therapy: Secondary | ICD-10-CM | POA: Diagnosis not present

## 2016-04-08 DIAGNOSIS — M180 Bilateral primary osteoarthritis of first carpometacarpal joints: Secondary | ICD-10-CM | POA: Diagnosis not present

## 2016-04-08 DIAGNOSIS — M151 Heberden's nodes (with arthropathy): Secondary | ICD-10-CM | POA: Diagnosis not present

## 2016-04-08 DIAGNOSIS — R768 Other specified abnormal immunological findings in serum: Secondary | ICD-10-CM | POA: Diagnosis not present

## 2016-04-08 DIAGNOSIS — M25542 Pain in joints of left hand: Secondary | ICD-10-CM | POA: Diagnosis not present

## 2016-04-08 DIAGNOSIS — M25541 Pain in joints of right hand: Secondary | ICD-10-CM | POA: Diagnosis not present

## 2016-05-01 ENCOUNTER — Ambulatory Visit
Admission: RE | Admit: 2016-05-01 | Discharge: 2016-05-01 | Disposition: A | Discharge status: Home / Self Care | Payer: PPO | Source: Ambulatory Visit | Attending: Family Medicine | Admitting: Family Medicine

## 2016-05-01 DIAGNOSIS — R921 Mammographic calcification found on diagnostic imaging of breast: Secondary | ICD-10-CM | POA: Diagnosis not present

## 2016-05-01 DIAGNOSIS — Z853 Personal history of malignant neoplasm of breast: Secondary | ICD-10-CM | POA: Diagnosis not present

## 2016-05-01 DIAGNOSIS — Z1231 Encounter for screening mammogram for malignant neoplasm of breast: Secondary | ICD-10-CM

## 2016-05-01 HISTORY — DX: Malignant neoplasm of unspecified site of unspecified female breast: C50.919

## 2016-06-20 ENCOUNTER — Telehealth: Payer: Self-pay | Admitting: Family Medicine

## 2016-06-20 ENCOUNTER — Other Ambulatory Visit: Payer: Self-pay | Admitting: Family Medicine

## 2016-06-20 NOTE — Telephone Encounter (Signed)
error 

## 2016-07-07 DIAGNOSIS — T849XXA Unspecified complication of internal orthopedic prosthetic device, implant and graft, initial encounter: Secondary | ICD-10-CM | POA: Diagnosis not present

## 2016-07-07 DIAGNOSIS — M19071 Primary osteoarthritis, right ankle and foot: Secondary | ICD-10-CM | POA: Diagnosis not present

## 2016-07-07 DIAGNOSIS — M79671 Pain in right foot: Secondary | ICD-10-CM | POA: Diagnosis not present

## 2016-07-07 DIAGNOSIS — Q6621 Congenital metatarsus primus varus: Secondary | ICD-10-CM | POA: Diagnosis not present

## 2016-07-08 ENCOUNTER — Ambulatory Visit (INDEPENDENT_AMBULATORY_CARE_PROVIDER_SITE_OTHER): Payer: PPO | Admitting: Family Medicine

## 2016-07-08 ENCOUNTER — Encounter: Payer: Self-pay | Admitting: Family Medicine

## 2016-07-08 VITALS — BP 124/58 | HR 58 | Temp 97.6°F | Resp 16 | Wt 201.0 lb

## 2016-07-08 DIAGNOSIS — E785 Hyperlipidemia, unspecified: Secondary | ICD-10-CM | POA: Diagnosis not present

## 2016-07-08 DIAGNOSIS — Z01818 Encounter for other preprocedural examination: Secondary | ICD-10-CM

## 2016-07-08 DIAGNOSIS — I25118 Atherosclerotic heart disease of native coronary artery with other forms of angina pectoris: Secondary | ICD-10-CM | POA: Diagnosis not present

## 2016-07-08 DIAGNOSIS — M069 Rheumatoid arthritis, unspecified: Secondary | ICD-10-CM | POA: Diagnosis not present

## 2016-07-08 DIAGNOSIS — I255 Ischemic cardiomyopathy: Secondary | ICD-10-CM

## 2016-07-08 NOTE — Progress Notes (Signed)
Patient ID: Teresa [name], female   DOB: [birth year], 75 y.o.   MRN: [id number]    Subjective:  HPI Pt is here today for surgical clearance. She is having foot surgery on her right foot by Dr. Elvina Mattes on August 3rd. She has forms but she left them at home. She will bring them back to have them filled out.  No chest pain, shortness of breath, neurologic symptoms at all.  Prior to Admission medications   Medication Sig Start Date End Date Taking? Authorizing Provider  acetaminophen (TYLENOL) 500 MG tablet Take by mouth.   Yes Historical Provider, MD  aspirin EC 81 MG tablet Take 1 tablet (81 mg total) by mouth daily. 03/03/16  Yes Wellington Hampshire, MD  Budesonide-Formoterol Fumarate (SYMBICORT IN) Inhale into the lungs as needed.   Yes Historical Provider, MD  calcium carbonate (OS-CAL) 600 MG TABS tablet Take 600 mg by mouth 2 (two) times daily with a meal.   Yes Historical Provider, MD  fluticasone (FLONASE) 50 MCG/ACT nasal spray Place 1 spray into both nostrils daily.   Yes Historical Provider, MD  folic acid (FOLVITE) 1 MG tablet Take by mouth. 04/09/16 04/09/17 Yes Historical Provider, MD  MAGNESIUM CITRATE PO Take by mouth.   Yes Historical Provider, MD  meclizine (ANTIVERT) 25 MG tablet Take 25 mg by mouth as needed.  04/03/15  Yes Historical Provider, MD  metoprolol succinate (TOPROL-XL) 25 MG 24 hr tablet Take 1 tablet (25 mg total) by mouth daily. 09/24/15  Yes Richard Maceo Pro., MD  montelukast (SINGULAIR) 10 MG tablet Take 10 mg by mouth daily.  04/02/15  Yes Historical Provider, MD  Multiple Vitamin (MULTIVITAMIN) tablet Take 1 tablet by mouth daily.   Yes Historical Provider, MD  naproxen sodium (ANAPROX) 220 MG tablet Take 220 mg by mouth 2 (two) times daily with a meal.   Yes Historical Provider, MD  omeprazole (PRILOSEC) 20 MG capsule Take 1 capsule (20 mg total) by mouth daily. 04/07/16  Yes Richard Maceo Pro., MD  venlafaxine [facility]) 75 MG tablet TAKE TWO TABLETS BY MOUTH  ONCE DAILY 06/20/16  Yes Jerrol Banana., MD  Vitamin D, Ergocalciferol, (DRISDOL) 50000 units CAPS capsule Take 50,000 Units by mouth every 7 (seven) days.   Yes Historical Provider, MD  meloxicam (MOBIC) 7.5 MG tablet Take 1 tablet (7.5 mg total) by mouth daily. Patient not taking: Reported on 07/08/2016 01/21/16   Jerrol Banana., MD    Patient Active Problem List   Diagnosis Date Noted  . Psoriasis 07/16/2015  . Hyperlipidemia 03/29/2015  . Pre-syncope 03/04/2015  . CAD (coronary artery disease)   . PVC's (premature ventricular contractions)   . Ischemic cardiomyopathy   . DDD (degenerative disc disease), lumbar 01/18/2015  . L-S radiculopathy 07/03/2014  . Breast CA ([place]) 06/28/2014  . Arthritis, degenerative 06/28/2014  . Acid reflux 03/08/2014    Past Medical History  Diagnosis Date  . Gastroesophageal reflux disease   . Hx of breast cancer   . MI (myocardial infarction) ([place])   . Hypertension   . CAD (coronary artery disease)     CABG in 1994  . PVC's (premature ventricular contractions)   . Ischemic cardiomyopathy     Most recent ejection fraction was 40% in 2013  . Rheumatoid arthritis ([place])   . Cancer [facility])     breast cancer 2009  . Breast cancer [facility]) 2009    left breast     Social History  Social History  . Marital Status: Divorced    Spouse Name: N/A  . Number of Children: N/A  . Years of Education: N/A   Occupational History  . Not on file.   Social History Main Topics  . Smoking status: Former Smoker -- 0.50 packs/day for 35 years    Types: Cigarettes  . Smokeless tobacco: Never Used  . Alcohol Use: No  . Drug Use: No  . Sexual Activity: Not on file   Other Topics Concern  . Not on file   Social History Narrative    Allergies  Allergen Reactions  . Ivp Dye [Iodinated Diagnostic Agents] Swelling  . Sulfa Antibiotics     Review of Systems  Constitutional: Positive for malaise/fatigue.  HENT: Negative.   Eyes: Negative.     Respiratory: Negative.   Cardiovascular: Negative.   Gastrointestinal: Negative.   Genitourinary: Negative.   Musculoskeletal: Negative.   Skin: Negative.   Neurological: Negative.   Endo/Heme/Allergies: Negative.   Psychiatric/Behavioral: Negative.     Immunization History  Administered Date(s) Administered  . Influenza, High Dose Seasonal PF 10/04/2015  . Influenza-Unspecified 07/22/2014   Objective:  BP 124/58 mmHg  Pulse 58  Temp(Src) 97.6 F (36.4 C) (Oral)  Resp 16  Wt 201 lb (91.173 kg)  SpO2 94%  Physical Exam  Constitutional: She is oriented to person, place, and time and well-developed, well-nourished, and in no distress.  HENT:  Head: Normocephalic and atraumatic.  Eyes: Conjunctivae and EOM are normal. Pupils are equal, round, and reactive to light.  Neck: Normal range of motion. Neck supple.  Cardiovascular: Normal rate, regular rhythm, normal heart sounds and intact distal pulses.   Pulmonary/Chest: Effort normal and breath sounds normal.  Abdominal: Soft. Bowel sounds are normal.  Musculoskeletal: Normal range of motion.  Neurological: She is alert and oriented to person, place, and time. She has normal reflexes. Gait normal. GCS score is 15.  Skin: Skin is warm and dry.  Psychiatric: Mood, memory, affect and judgment normal.    Lab Results  Component Value Date   WBC 6.5 12/06/2013   HGB 12.7 12/21/2013   HCT 47.5* 12/06/2013   PLT 187 12/21/2013   GLUCOSE 132* 12/21/2013   INR 0.9 12/06/2013    CMP     Component Value Date/Time   NA 138 12/21/2013 0447   NA 139 10/26/2007 1252   K 4.5 12/21/2013 0447   K 4.3 10/26/2007 1252   CL 105 12/21/2013 0447   CL 103 10/26/2007 1252   CO2 34* 12/21/2013 0447   CO2 30 10/26/2007 1252   GLUCOSE 132* 12/21/2013 0447   GLUCOSE 87 10/26/2007 1252   BUN 8 12/21/2013 0447   BUN 14 10/26/2007 1252   CREATININE 0.61 12/21/2013 0447   CREATININE 0.71 10/26/2007 1252   CALCIUM 8.5 12/21/2013 0447    CALCIUM 9.4 10/26/2007 1252   PROT 7.0 05/11/2013 1547   PROT 6.4 10/26/2007 1252   ALBUMIN 3.6 05/11/2013 1547   ALBUMIN 3.6 10/26/2007 1252   AST 23 05/11/2013 1547   AST 14 10/26/2007 1252   ALT 24 05/11/2013 1547   ALT 13 10/26/2007 1252   ALKPHOS 113 05/11/2013 1547   ALKPHOS 81 10/26/2007 1252   BILITOT 0.3 05/11/2013 1547   BILITOT 0.7 10/26/2007 1252   GFRNONAA >60 12/21/2013 0447   GFRNONAA >60 02/27/2012 0241   GFRNONAA >60 10/26/2007 1252   GFRAA >60 12/21/2013 0447   GFRAA >60 02/27/2012 0241   GFRAA  10/26/2007 1252    >  60        The eGFR has been calculated using the MDRD equation. This calculation has not been validated in all clinical    Assessment and Plan :  1. Pre-operative clearance Cleared for surgery - EKG 12-Lead  2. Hyperlipidemia  - Lipid Panel With LDL/HDL Ratio - Comprehensive metabolic panel  3. Coronary artery disease involving native coronary artery with other forms of angina pectoris ([place])   4. Rheumatoid arthritis involving multiple joints (HCC)  - CBC with Differential/Platelet  5. Ischemic cardiomyopathy EF 40% 2013. All risk factors treated. - TSH   Patient was seen and examined by Dr. Miguel Aschoff, and noted scribed by Webb Laws, CMA I have done the exam and reviewed the above chart and it is accurate to the best of my knowledge.  Miguel Aschoff MD [facility] 07/08/2016 9:44 AM

## 2016-07-09 LAB — CBC WITH DIFFERENTIAL/PLATELET
Basophils Absolute: 0 10*3/uL (ref 0.0–0.2)
Basos: 0 %
EOS (ABSOLUTE): 0.7 10*3/uL — ABNORMAL HIGH (ref 0.0–0.4)
Eos: 9 %
Hematocrit: 44.7 % (ref 34.0–46.6)
Hemoglobin: 14.7 g/dL (ref 11.1–15.9)
Immature Grans (Abs): 0 10*3/uL (ref 0.0–0.1)
Immature Granulocytes: 0 %
Lymphocytes Absolute: 1.7 10*3/uL (ref 0.7–3.1)
Lymphs: 21 %
MCH: 30.7 pg (ref 26.6–33.0)
MCHC: 32.9 g/dL (ref 31.5–35.7)
MCV: 93 fL (ref 79–97)
Monocytes Absolute: 0.8 10*3/uL (ref 0.1–0.9)
Monocytes: 9 %
Neutrophils Absolute: 5 10*3/uL (ref 1.4–7.0)
Neutrophils: 61 %
Platelets: 265 10*3/uL (ref 150–379)
RBC: 4.79 x10E6/uL (ref 3.77–5.28)
RDW: 13.9 % (ref 12.3–15.4)
WBC: 8.2 10*3/uL (ref 3.4–10.8)

## 2016-07-09 LAB — LIPID PANEL WITH LDL/HDL RATIO
Cholesterol, Total: 169 mg/dL (ref 100–199)
HDL: 63 mg/dL (ref >39)
LDL Calculated: 91 mg/dL (ref 0–99)
LDl/HDL Ratio: 1.4 ratio units (ref 0.0–3.2)
Triglycerides: 73 mg/dL (ref 0–149)
VLDL Cholesterol Cal: 15 mg/dL (ref 5–40)

## 2016-07-09 LAB — COMPREHENSIVE METABOLIC PANEL
ALT: 11 IU/L (ref 0–32)
AST: 17 IU/L (ref 0–40)
Albumin/Globulin Ratio: 1.6 (ref 1.2–2.2)
Albumin: 3.8 g/dL (ref 3.5–4.8)
Alkaline Phosphatase: 103 IU/L (ref 39–117)
BUN/Creatinine Ratio: 28 (ref 12–28)
BUN: 19 mg/dL (ref 8–27)
Bilirubin Total: 0.5 mg/dL (ref 0.0–1.2)
CO2: 27 mmol/L (ref 18–29)
Calcium: 8.9 mg/dL (ref 8.7–10.3)
Chloride: 104 mmol/L (ref 96–106)
Creatinine, Ser: 0.67 mg/dL (ref 0.57–1.00)
GFR calc Af Amer: 100 mL/min/{1.73_m2} (ref >59)
GFR calc non Af Amer: 87 mL/min/{1.73_m2} (ref >59)
Globulin, Total: 2.4 g/dL (ref 1.5–4.5)
Glucose: 92 mg/dL (ref 65–99)
Potassium: 4.8 mmol/L (ref 3.5–5.2)
Sodium: 144 mmol/L (ref 134–144)
Total Protein: 6.2 g/dL (ref 6.0–8.5)

## 2016-07-09 LAB — TSH: TSH: 1.67 u[IU]/mL (ref 0.450–4.500)

## 2016-07-11 ENCOUNTER — Telehealth: Payer: Self-pay

## 2016-07-11 NOTE — Telephone Encounter (Signed)
LMTCB 07/11/2016  Thanks,   -Laura  

## 2016-07-11 NOTE — Telephone Encounter (Signed)
-----   Message from Jerrol Banana., MD sent at 07/09/2016  7:54 AM EDT ----- Labs normal.

## 2016-07-14 NOTE — Telephone Encounter (Signed)
Pt advised-aa 

## 2016-07-18 ENCOUNTER — Encounter: Payer: Self-pay | Admitting: *Deleted

## 2016-07-21 DIAGNOSIS — Q6621 Congenital metatarsus primus varus: Secondary | ICD-10-CM | POA: Diagnosis not present

## 2016-07-21 DIAGNOSIS — M19071 Primary osteoarthritis, right ankle and foot: Secondary | ICD-10-CM | POA: Diagnosis not present

## 2016-07-21 DIAGNOSIS — M79671 Pain in right foot: Secondary | ICD-10-CM | POA: Diagnosis not present

## 2016-07-21 DIAGNOSIS — T849XXA Unspecified complication of internal orthopedic prosthetic device, implant and graft, initial encounter: Secondary | ICD-10-CM | POA: Diagnosis not present

## 2016-07-21 NOTE — Discharge Instructions (Signed)
[facility] °[facility] ° °POST OPERATIVE INSTRUCTIONS FOR DR. TROXLER AND DR. FOWLER °[facility] ° ° °1. Take your medication as prescribed.  Pain medication should be taken only as needed. ° °2. Keep the dressing clean, dry and intact. ° °3. Keep your foot elevated above the heart level for the first 48 hours. ° °4. Walking to the bathroom and brief periods of walking are acceptable, unless we have instructed you to be non-weight bearing. ° °5. Always wear your post-op shoe when walking.  Always use your crutches if you are to be non-weight bearing. ° °6. Do not take a shower. Baths are permissible as long as the foot is kept out of the water.  ° °7. Every hour you are awake:  °- Bend your knee 15 times. °- Flex foot 15 times °- Massage calf 15 times ° °8. Call [facility] ([phone]) if any of the following problems occur: °- You develop a temperature or fever. °- The bandage becomes saturated with blood. °- Medication does not stop your pain. °- Injury of the foot occurs. °- Any symptoms of infection including redness, odor, or red streaks running from wound. ° °General Anesthesia, Adult, Care After °Refer to this sheet in the next few weeks. These instructions provide you with information on caring for yourself after your procedure. Your health care provider may also give you more specific instructions. Your treatment has been planned according to current medical practices, but problems sometimes occur. Call your health care provider if you have any problems or questions after your procedure. °WHAT TO EXPECT AFTER THE PROCEDURE °After the procedure, it is typical to experience: °· Sleepiness. °· Nausea and vomiting. °HOME CARE INSTRUCTIONS °· For the first 24 hours after general anesthesia: °¨ Have a responsible person with you. °¨ Do not drive a car. If you are alone, do not take public transportation. °¨ Do not drink alcohol. °¨ Do not take  medicine that has not been prescribed by your health care provider. °¨ Do not sign important papers or make important decisions. °¨ You may resume a normal diet and activities as directed by your health care provider. °· Change bandages (dressings) as directed. °· If you have questions or problems that seem related to general anesthesia, call the hospital and ask for the anesthetist or anesthesiologist on call. °SEEK MEDICAL CARE IF: °· You have nausea and vomiting that continue the day after anesthesia. °· You develop a rash. °SEEK IMMEDIATE MEDICAL CARE IF:  °· You have difficulty breathing. °· You have chest pain. °· You have any allergic problems. °  °This information is not intended to replace advice given to you by your health care provider. Make sure you discuss any questions you have with your health care provider. °  °Document Released: 03/16/2001 Document Revised: 12/29/2014 Document Reviewed: 04/07/2012 °Elsevier Interactive Patient Education ©2016 [employer] ° °

## 2016-07-24 ENCOUNTER — Encounter
Admission: RE | Disposition: A | Discharge status: Home / Self Care | Payer: Self-pay | Source: Ambulatory Visit | Attending: Podiatry

## 2016-07-24 ENCOUNTER — Ambulatory Visit: Payer: PPO | Admitting: Anesthesiology

## 2016-07-24 ENCOUNTER — Ambulatory Visit
Admission: RE | Admit: 2016-07-24 | Discharge: 2016-07-24 | Disposition: A | Discharge status: Home / Self Care | Payer: PPO | Source: Ambulatory Visit | Attending: Podiatry | Admitting: Podiatry

## 2016-07-24 DIAGNOSIS — Z87891 Personal history of nicotine dependence: Secondary | ICD-10-CM | POA: Insufficient documentation

## 2016-07-24 DIAGNOSIS — I2510 Atherosclerotic heart disease of native coronary artery without angina pectoris: Secondary | ICD-10-CM | POA: Insufficient documentation

## 2016-07-24 DIAGNOSIS — G709 Myoneural disorder, unspecified: Secondary | ICD-10-CM | POA: Diagnosis not present

## 2016-07-24 DIAGNOSIS — Z882 Allergy status to sulfonamides status: Secondary | ICD-10-CM | POA: Insufficient documentation

## 2016-07-24 DIAGNOSIS — Z885 Allergy status to narcotic agent status: Secondary | ICD-10-CM | POA: Insufficient documentation

## 2016-07-24 DIAGNOSIS — M2011 Hallux valgus (acquired), right foot: Secondary | ICD-10-CM | POA: Diagnosis not present

## 2016-07-24 DIAGNOSIS — K219 Gastro-esophageal reflux disease without esophagitis: Secondary | ICD-10-CM | POA: Diagnosis not present

## 2016-07-24 DIAGNOSIS — M19071 Primary osteoarthritis, right ankle and foot: Secondary | ICD-10-CM | POA: Diagnosis not present

## 2016-07-24 DIAGNOSIS — I252 Old myocardial infarction: Secondary | ICD-10-CM | POA: Diagnosis not present

## 2016-07-24 DIAGNOSIS — J45909 Unspecified asthma, uncomplicated: Secondary | ICD-10-CM | POA: Diagnosis not present

## 2016-07-24 DIAGNOSIS — G8929 Other chronic pain: Secondary | ICD-10-CM | POA: Insufficient documentation

## 2016-07-24 DIAGNOSIS — Z91041 Radiographic dye allergy status: Secondary | ICD-10-CM | POA: Insufficient documentation

## 2016-07-24 DIAGNOSIS — T8484XA Pain due to internal orthopedic prosthetic devices, implants and grafts, initial encounter: Secondary | ICD-10-CM | POA: Diagnosis not present

## 2016-07-24 DIAGNOSIS — Q6621 Congenital metatarsus primus varus: Secondary | ICD-10-CM | POA: Diagnosis not present

## 2016-07-24 HISTORY — PX: HARDWARE REMOVAL: SHX979

## 2016-07-24 HISTORY — DX: Unspecified asthma, uncomplicated: J45.909

## 2016-07-24 HISTORY — DX: Dental restoration status: Z98.811

## 2016-07-24 HISTORY — PX: FOOT ARTHRODESIS: SHX1655

## 2016-07-24 HISTORY — DX: Dizziness and giddiness: R42

## 2016-07-24 SURGERY — FUSION, JOINT, FOOT
Anesthesia: Regional | Site: Foot | Laterality: Right | Wound class: Clean

## 2016-07-24 MED ORDER — MIDAZOLAM HCL 5 MG/5ML IJ SOLN
INTRAMUSCULAR | Status: DC | PRN
  Administered 2016-07-24: 1 mg via INTRAVENOUS

## 2016-07-24 MED ORDER — BUPIVACAINE-EPINEPHRINE (PF) 0.25% -1:200000 IJ SOLN
INTRAMUSCULAR | Status: DC | PRN
  Administered 2016-07-24: 5 mL

## 2016-07-24 MED ORDER — ONDANSETRON HCL 4 MG/2ML IJ SOLN: 4.0000 mg | Freq: Once | INTRAMUSCULAR | Status: DC | PRN

## 2016-07-24 MED ORDER — PROPOFOL 10 MG/ML IV BOLUS
INTRAVENOUS | Status: DC | PRN
  Administered 2016-07-24: 150 mg via INTRAVENOUS

## 2016-07-24 MED ORDER — CEFAZOLIN SODIUM-DEXTROSE 2-4 GM/100ML-% IV SOLN
2.0000 g | Freq: Once | INTRAVENOUS | Status: AC
  Administered 2016-07-24: 2 g via INTRAVENOUS

## 2016-07-24 MED ORDER — ROPIVACAINE HCL 5 MG/ML IJ SOLN
INTRAMUSCULAR | Status: DC | PRN
  Administered 2016-07-24: 30 mL via PERINEURAL

## 2016-07-24 MED ORDER — LACTATED RINGERS IV SOLN: 500.0000 mL | INTRAVENOUS | Status: DC

## 2016-07-24 MED ORDER — GLYCOPYRROLATE 0.2 MG/ML IJ SOLN
INTRAMUSCULAR | Status: DC | PRN
  Administered 2016-07-24: 0.1 mg via INTRAVENOUS

## 2016-07-24 MED ORDER — LACTATED RINGERS IV SOLN
INTRAVENOUS | Status: DC
  Administered 2016-07-24 (×2): via INTRAVENOUS

## 2016-07-24 MED ORDER — OXYCODONE-ACETAMINOPHEN 7.5-325 MG PO TABS: 1.0000 | ORAL_TABLET | ORAL | 0 refills | Status: DC | PRN

## 2016-07-24 MED ORDER — ENOXAPARIN SODIUM 40 MG/0.4ML ~~LOC~~ SOLN: 40.0000 mg | SUBCUTANEOUS | 1 refills | Status: DC

## 2016-07-24 MED ORDER — LIDOCAINE HCL (CARDIAC) 20 MG/ML IV SOLN
INTRAVENOUS | Status: DC | PRN
  Administered 2016-07-24: 40 mg via INTRATRACHEAL

## 2016-07-24 MED ORDER — ONDANSETRON HCL 4 MG/2ML IJ SOLN
INTRAMUSCULAR | Status: DC | PRN
Start: 2016-07-24 — End: 2016-07-24
  Administered 2016-07-24: 4 mg via INTRAVENOUS

## 2016-07-24 MED ORDER — EPHEDRINE SULFATE 50 MG/ML IJ SOLN
INTRAMUSCULAR | Status: DC | PRN
  Administered 2016-07-24 (×2): 10 mg via INTRAVENOUS

## 2016-07-24 MED ORDER — DEXAMETHASONE SODIUM PHOSPHATE 4 MG/ML IJ SOLN
INTRAMUSCULAR | Status: DC | PRN
  Administered 2016-07-24: 4 mg via PERINEURAL
  Administered 2016-07-24: 4 mg via INTRAVENOUS

## 2016-07-24 MED ORDER — FENTANYL CITRATE (PF) 100 MCG/2ML IJ SOLN
INTRAMUSCULAR | Status: DC | PRN
  Administered 2016-07-24: 100 ug via INTRAVENOUS

## 2016-07-24 SURGICAL SUPPLY — 61 items
1.1 KWIRE ×4 IMPLANT
2.0MM CANNULATED DRILL W/ AO ×4 IMPLANT
BANDAGE ELASTIC 4 CLIP NS LF (GAUZE/BANDAGES/DRESSINGS) ×4 IMPLANT
BANDAGE ELASTIC 4 VELCRO NS (GAUZE/BANDAGES/DRESSINGS) ×4 IMPLANT
BENZOIN TINCTURE PRP APPL 2/3 (GAUZE/BANDAGES/DRESSINGS) ×4 IMPLANT
BLADE MINI RND TIP GREEN BEAV (BLADE) IMPLANT
BLADE OSC/SAGITTAL MD 5.5X18 (BLADE) IMPLANT
BLADE OSC/SAGITTAL MD 9X18.5 (BLADE) IMPLANT
BLADE OSCILLATING/SAGITTAL (BLADE) ×1
BLADE SURG 15 STRL LF DISP TIS (BLADE) ×6 IMPLANT
BLADE SURG 15 STRL SS (BLADE) ×2
BLADE SW THK.38XMED LNG THN (BLADE) ×3 IMPLANT
BNDG ESMARK 4X12 TAN STRL LF (GAUZE/BANDAGES/DRESSINGS) ×4 IMPLANT
BNDG GAUZE 4.5X4.1 6PLY STRL (MISCELLANEOUS) ×4 IMPLANT
BNDG STRETCH 4X75 STRL LF (GAUZE/BANDAGES/DRESSINGS) ×4 IMPLANT
CANISTER SUCT 1200ML W/VALVE (MISCELLANEOUS) ×4 IMPLANT
CONTROL 360 (Bone Implant) ×4 IMPLANT
COVER LIGHT HANDLE UNIVERSAL (MISCELLANEOUS) ×8 IMPLANT
COVER PIN YLW 0.028-062 (MISCELLANEOUS) IMPLANT
CUFF TOURN SGL QUICK 18 (TOURNIQUET CUFF) ×4 IMPLANT
DRAPE FLUOR MINI C-ARM 54X84 (DRAPES) ×4 IMPLANT
DURAPREP 26ML APPLICATOR (WOUND CARE) ×4 IMPLANT
GAUZE PETRO XEROFOAM 1X8 (MISCELLANEOUS) ×4 IMPLANT
GAUZE SPONGE 4X4 12PLY STRL (GAUZE/BANDAGES/DRESSINGS) ×4 IMPLANT
GLOVE BIO SURGEON STRL SZ8 (GLOVE) ×8 IMPLANT
GLOVE INDICATOR 7.5 STRL GRN (GLOVE) ×4 IMPLANT
GOWN STRL REUS W/ TWL LRG LVL3 (GOWN DISPOSABLE) ×3 IMPLANT
GOWN STRL REUS W/ TWL XL LVL3 (GOWN DISPOSABLE) ×3 IMPLANT
GOWN STRL REUS W/TWL LRG LVL3 (GOWN DISPOSABLE) ×1
GOWN STRL REUS W/TWL XL LVL3 (GOWN DISPOSABLE) ×1
K-WIRE DBL END TROCAR 6X.045 (WIRE)
K-WIRE DBL END TROCAR 6X.062 (WIRE) ×8
KIT ROOM TURNOVER OR (KITS) ×4 IMPLANT
KWIRE DBL END TROCAR 6X.045 (WIRE) IMPLANT
KWIRE DBL END TROCAR 6X.062 (WIRE) ×6 IMPLANT
NEEDLE FILTER BLUNT 18X 1/2SAF (NEEDLE) ×1
NEEDLE FILTER BLUNT 18X1 1/2 (NEEDLE) ×3 IMPLANT
NEEDLE HYPO 18GX1.5 BLUNT FILL (NEEDLE) IMPLANT
NEEDLE HYPO 25GX1X1/2 BEV (NEEDLE) IMPLANT
NS IRRIG 500ML POUR BTL (IV SOLUTION) ×4 IMPLANT
PACK EXTREMITY ARMC (MISCELLANEOUS) ×4 IMPLANT
PAD CAST CTTN 4X4 STRL (SOFTGOODS) ×9 IMPLANT
PAD GROUND ADULT SPLIT (MISCELLANEOUS) ×4 IMPLANT
PADDING CAST COTTON 4X4 STRL (SOFTGOODS) ×3
PENCIL ELECTRO HAND CTR (MISCELLANEOUS) ×4 IMPLANT
RASP SM TEAR CROSS CUT (RASP) ×4 IMPLANT
SCREW CANNULATED 3.0X28MM (Screw) ×4 IMPLANT
SPLINT CAST 1 STEP 4X30 (MISCELLANEOUS) ×4 IMPLANT
SPLINT FAST PLASTER 5X30 (CAST SUPPLIES) ×1
SPLINT PLASTER CAST FAST 5X30 (CAST SUPPLIES) ×3 IMPLANT
STAPLE NIT 15X12X10.9MMX1.5 (Staple) ×4 IMPLANT
STOCKINETTE STRL 6IN 960660 (GAUZE/BANDAGES/DRESSINGS) ×4 IMPLANT
STRAP BODY AND KNEE 60X3 (MISCELLANEOUS) ×4 IMPLANT
STRIP CLOSURE SKIN 1/4X4 (GAUZE/BANDAGES/DRESSINGS) ×4 IMPLANT
SUT VIC AB 2-0 SH 27 (SUTURE)
SUT VIC AB 2-0 SH 27XBRD (SUTURE) IMPLANT
SUT VIC AB 3-0 SH 27 (SUTURE)
SUT VIC AB 3-0 SH 27X BRD (SUTURE) IMPLANT
SUT VIC AB 4-0 FS2 27 (SUTURE) ×12 IMPLANT
SUT VICRYL AB 3-0 FS1 BRD 27IN (SUTURE) IMPLANT
SYR 20CC LL (SYRINGE) ×4 IMPLANT

## 2016-07-24 NOTE — Anesthesia Preprocedure Evaluation (Signed)
Anesthesia Evaluation  Patient identified by MRN, date of birth, ID band Patient awake    Reviewed: Allergy & Precautions, H&P , NPO status , Patient's Chart, lab work & pertinent test results, reviewed documented beta blocker date and time   Airway Mallampati: II  TM Distance: >3 FB Neck ROM: full    Dental no notable dental hx.    Pulmonary asthma , former smoker,    Pulmonary exam normal breath sounds clear to auscultation       Cardiovascular Exercise Tolerance: Good hypertension, + CAD and + Past MI   Rhythm:regular Rate:Normal     Neuro/Psych  Neuromuscular disease negative neurological ROS  negative psych ROS   GI/Hepatic Neg liver ROS, GERD  Medicated,  Endo/Other  negative endocrine ROS  Renal/GU negative Renal ROS  negative genitourinary   Musculoskeletal   Abdominal   Peds  Hematology negative hematology ROS (+)   Anesthesia Other Findings   Reproductive/Obstetrics negative OB ROS                             Anesthesia Physical Anesthesia Plan  ASA: III  Anesthesia Plan: Regional and General LMA   Post-op Pain Management:    Induction:   Airway Management Planned:   Additional Equipment:   Intra-op Plan:   Post-operative Plan:   Informed Consent: I have reviewed the patients History and Physical, chart, labs and discussed the procedure including the risks, benefits and alternatives for the proposed anesthesia with the patient or authorized representative who has indicated his/her understanding and acceptance.     Plan Discussed with: CRNA  Anesthesia Plan Comments:         Anesthesia Quick Evaluation

## 2016-07-24 NOTE — Anesthesia Postprocedure Evaluation (Signed)
Anesthesia Post Note  Patient: Teresa [name]  Procedure(s) Performed: Procedure(s) (LRB): FUSION FIRST METATARSAL CUNEIFORM JOINT RIGHT FOOT, FUSION SECOND METATARSAL CUNEFORM JOINT BUNION REPAIR RIGHT FOOT (Right) REMOVAL HARDWARE LATERAL MALLEOUS RIGHT ANKLE (Right)  Patient location during evaluation: PACU Anesthesia Type: General Level of consciousness: awake and alert Pain management: pain level controlled Vital Signs Assessment: post-procedure vital signs reviewed and stable Respiratory status: spontaneous breathing, nonlabored ventilation and respiratory function stable Cardiovascular status: blood pressure returned to baseline and stable Postop Assessment: no signs of nausea or vomiting Anesthetic complications: no    Brodyn Depuy D Meghna Hagmann

## 2016-07-24 NOTE — Op Note (Signed)
Operative note  Surgeon: Dr. Albertine Patricia, DPM.    Assistant: None    Preop diagnosis: 1. 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 2. 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 Painful Steinman pin to the lateral distal fibula right ankle.    Postop diagnosis: Same    Procedure:   1. Lapidus hallux valgus correction with Lapiplasty technique right foot   2. Arthrodesis second metatarsocuneiform joints right foot   3. Removal of painful Steinman pin right fibula     EBL: 25 cc    Anesthesia:general with popliteal block given by the anesthesia team. 5 cc of Marcaine with epinephrine was injected to both operative sites me during the operative procedure.    Hemostasis: Ankle tourniquet 250 mg mercury pressure up originally for 105 minutes then released for 25 minutes then taken up again for another 20 minutes.    Specimen: None    Complications: None    Operative indications: Chronic pain resistant and not helped by conservative care. Continued worsening arthritis to the second metatarsal cuneiform joint and continued joint deformity worsening. Pain from pin site was interfering with her ability to wear closed in shoes and boots.    Procedure:  Patient was brought into the OR and placed on the operating table in thesupine position. After anesthesia was obtained theright lower extremity was prepped and draped in usual sterile fashion.  Operative Report: At this time to his directed to the dorsum of the right foot in the area over the first metatarsal cuneiform joint close to the lateral portion of it. This point a 7 cm curvilinear incision was made beginning proximal to the joint and carried distally and medially to the distal third of the first metatarsal S incision was then deepened sharp blunt dissection bleeders clamped and bovied as required vital structures were identified and retracted medial and laterally. The extensor tendon was identified  and freed from the soft tissue and retracted laterally. This incision was just medial to the tendon and tendon sheath. Once the tendon was retracted laterally incision made down to bone and freeing the capsular and periosteal tissue around the first metatarsocuneiform and the first metatarsal base. This point the incision was deepened and carried laterally freeing periosteal Tissue away from the lateral portion of the first metatarsal cuneiform joint and also freeing up the second metatarsal cuneiform joint. A prominent of bone was noted over the second met cuneiform joint is resected with power equipment this timeframe.  At this time a sagittal saw blade was introduced and the first metatarsal cuneiform joint and was used to free up the plantar ridge and also soft tissue structures across the joint itself. At this point is seen the first metatarsals freed up enough that it could be rotated and corrected. At this time the cut guide was used to he able to make a level mark on the second metatarsal where the correction guide could be placed. Once this was incision was made over the second metatarsal the correction guide was placed and the area and tightened correcting the first metatarsal position and both transverse and frontal planes. Once is in good position the cutting guide was placed over the area and stabilized and the articular cartilage was removed from the first metatarsal base and medial cuneiform. Once the cuts were made there was opened up and all residual bone and cartilage in the area was removed. A pituitary rongeur was used to get the deeper tissues removed.  The first metatarsal  correction device was then removed and attention was then directed to the second metatarsocuneiform joint where articular cartilage was removed utilizing combination of a power sagittal saw and osteotome and the pituitary Roger. Once effective removal of this tissue was achieved a rasp was used to remove any residual  cartilage areas and both sides of the joint were then fenestrated and a threaded cannulated screw was then placed across the joint as was a 15 x 12 staple. CT aspects of the bone were seen to be in good position and good coaptation at this point with good approximation of 2 areas.  At this time attention was directed back to the first metatarsal cuneiform joint the corrected the position correction device was removed and the areas on both sides of the joint were fenestrated than the first metatarsal was then moved to a corrected position with good alignment and fixated with an olive tip threaded K wire and also smooth K wire as well. There is checked FluoroScan excellent correction and position were noted on the area.  Prior to this fixation a first metatarsal phalangeal joint was incision capsule tissue was freed away from the first metatarsal head medially and dorsomedially. A large eminence of bone was removed and rasped smoothly from this region. A lateral capsulotomy was performed and a fibular sesamoidal ligament release was performed.  Attention was then directed back to the first metatarsocuneiform joint which had earlier been fixated and 2 plates in the lateral plasty set were then placed one dorsally with 4 screws and one medially with 4 screws. There is checked FluoroScan good position and correction and fixation were noted. At this time the tourniquet was released and prompt complete vascularity seen to return all digits of the left foot with no extensive bleeders.  At this time attention was directed to the lateral fibula a palpable pin was noted at the very tip of the fibula was very prominent. A 1 cm linear incision was made just anterior to this and deepened sharp blunt dissection Tissue overlying the pin was then freed the pin was then rotated and removed from the fibula. Is an copiously irrigated and capsular and soft tissue correction and closure was accomplished with 4 Vicryl in  continuous stitch as were superficial fascia and skin closed with a subcuticular stitch.  This time to his directed back to the first metatarsal and second metatarsal fusion sites and a hallux valgus correction area. Capsular periosteal tissue closed long length of the incision utilizing 4 Vicryl in continuous stitch. Deep superficial fascial layers were closed with 4 Vicryl in continuous stitch. Skin was closed with 4 Vicryl subcuticular fashion.  Sterile compressive dressings placed across all sites consisting of Steri-Strips Xeroform gauze 4 x 4's Kling and Kerlix. Tourniquet was released to the second time and prompt complete vascularity seen to return all digits of the right foot. The tourniquet been taken up prior to closure of the first metatarsal phalangeal joint incision. A posterior splint was placed on the right foot leg in the operating room. Patient is to remain nonweightbearing.   Patient tolerated the procedure and anesthesia well.  Was transported from the OR to the PACU with all vital signs stable and vascular status intact. To be discharged per routine protocol.  Will follow up in approximately 1 week in the outpatient clinic.

## 2016-07-24 NOTE — H&P (Signed)
H and P has been reviewed and no changes are noted.  

## 2016-07-24 NOTE — Anesthesia Procedure Notes (Signed)
Anesthesia Regional Block:  Popliteal block  Pre-Anesthetic Checklist: ,, timeout performed, Correct Patient, Correct Site, Correct Laterality, Correct Procedure, Correct Position, site marked, Risks and benefits discussed,  Surgical consent,  Pre-op evaluation,  At surgeon's request and post-op pain management  Laterality: Right  Prep: chloraprep       Needles:  Injection technique: Single-shot  Needle Type: Quincke     Needle Length: 9cm 9 cm Needle Gauge: 21 and 21 G    Additional Needles:  Procedures: ultrasound guided (picture in chart) Popliteal block Narrative:  Start time: 07/24/2016 7:29 AM End time: 07/24/2016 7:35 AM Injection made incrementally with aspirations every 5 mL.  Performed by: Personally  Anesthesiologist: Marchia Bond D  Additional Notes: Functioning IV was confirmed and monitors applied. Ultrasound guidance: relevant anatomy identified, needle position confirmed, local anesthetic spread visualized around nerve(s)., vascular puncture avoided.  Image printed for medical record.  Negative aspiration and no paresthesias; incremental administration of local anesthetic. The patient tolerated the procedure well. Vitals signes recorded in RN notes.

## 2016-07-24 NOTE — Transfer of Care (Signed)
Immediate Anesthesia Transfer of Care Note  Patient: Teresa [name]  Procedure(s) Performed: Procedure(s) with comments: FUSION FIRST METATARSAL CUNEIFORM JOINT RIGHT FOOT, FUSION SECOND METATARSAL CUNEFORM JOINT BUNION REPAIR RIGHT FOOT (Right) REMOVAL HARDWARE LATERAL MALLEOUS RIGHT ANKLE (Right) - REMOVAL OF PIN WHICH WAS INTACT  Patient Location: PACU  Anesthesia Type: Regional, General LMA  Level of Consciousness: awake, alert  and patient cooperative  Airway and Oxygen Therapy: Patient Spontanous Breathing and Patient connected to supplemental oxygen  Post-op Assessment: Post-op Vital signs reviewed, Patient's Cardiovascular Status Stable, Respiratory Function Stable, Patent Airway and No signs of Nausea or vomiting  Post-op Vital Signs: Reviewed and stable  Complications: No apparent anesthesia complications

## 2016-07-24 NOTE — Anesthesia Procedure Notes (Signed)
Procedure Name: LMA Insertion Performed by: Lind Guest Pre-anesthesia Checklist: Patient identified, Emergency Drugs available, Suction available, Patient being monitored and Timeout performed Patient Re-evaluated:Patient Re-evaluated prior to inductionOxygen Delivery Method: Circle system utilized Preoxygenation: Pre-oxygenation with 100% oxygen Intubation Type: IV induction LMA: LMA inserted LMA Size: 4.0 Number of attempts: 1 Tube secured with: Tape

## 2016-07-24 NOTE — Anesthesia Procedure Notes (Addendum)
Anesthesia Regional Block:  Adductor canal block  Pre-Anesthetic Checklist: ,, timeout performed, Correct Patient, Correct Site, Correct Laterality, Correct Procedure, Correct Position, site marked, Risks and benefits discussed,  Surgical consent,  Pre-op evaluation,  At surgeon's request and post-op pain management  Laterality: Right  Prep: chloraprep       Needles:  Injection technique: Single-shot  Needle Type: Quincke     Needle Length: 9cm 9 cm Needle Gauge: 21 and 21 G    Additional Needles:  Procedures: ultrasound guided (picture in chart) Adductor canal block Narrative:  Start time: 07/24/2016 7:29 AM End time: 07/24/2016 7:35 AM Injection made incrementally with aspirations every 5 mL.  Performed by: Personally  Anesthesiologist: Marchia Bond D  Additional Notes: Functioning IV was confirmed and monitors applied. Ultrasound guidance: relevant anatomy identified, needle position confirmed, local anesthetic spread visualized around nerve(s)., vascular puncture avoided.  Image printed for medical record.  Negative aspiration and no paresthesias; incremental administration of local anesthetic. The patient tolerated the procedure well. Vitals signes recorded in RN notes.

## 2016-07-24 NOTE — Progress Notes (Signed)
Assisted Marchia Bond ANMD with right, ultrasound guided, popliteal/saphenous block. Side rails up, monitors on throughout procedure. See vital signs in flow sheet. Tolerated Procedure well.

## 2016-07-26 ENCOUNTER — Encounter: Payer: Self-pay | Admitting: Podiatry

## 2016-07-30 ENCOUNTER — Encounter: Payer: Self-pay | Admitting: Podiatry

## 2016-07-30 DIAGNOSIS — M79671 Pain in right foot: Secondary | ICD-10-CM | POA: Diagnosis not present

## 2016-07-30 DIAGNOSIS — M19071 Primary osteoarthritis, right ankle and foot: Secondary | ICD-10-CM | POA: Diagnosis not present

## 2016-07-30 DIAGNOSIS — Q6621 Congenital metatarsus primus varus: Secondary | ICD-10-CM | POA: Diagnosis not present

## 2016-08-20 DIAGNOSIS — Q6621 Congenital metatarsus primus varus: Secondary | ICD-10-CM | POA: Diagnosis not present

## 2016-09-08 DIAGNOSIS — Q6621 Congenital metatarsus primus varus: Secondary | ICD-10-CM | POA: Diagnosis not present

## 2016-09-22 ENCOUNTER — Other Ambulatory Visit: Payer: Self-pay | Admitting: Podiatry

## 2016-09-22 ENCOUNTER — Other Ambulatory Visit: Payer: Self-pay | Admitting: Physician Assistant

## 2016-09-22 ENCOUNTER — Ambulatory Visit
Admission: RE | Admit: 2016-09-22 | Discharge: 2016-09-22 | Disposition: A | Discharge status: Home / Self Care | Payer: PPO | Source: Ambulatory Visit | Attending: Podiatry | Admitting: Podiatry

## 2016-09-22 DIAGNOSIS — I10 Essential (primary) hypertension: Secondary | ICD-10-CM

## 2016-09-22 DIAGNOSIS — M7989 Other specified soft tissue disorders: Secondary | ICD-10-CM | POA: Insufficient documentation

## 2016-09-22 DIAGNOSIS — M79661 Pain in right lower leg: Secondary | ICD-10-CM | POA: Diagnosis not present

## 2016-09-22 DIAGNOSIS — M79604 Pain in right leg: Secondary | ICD-10-CM

## 2016-09-22 DIAGNOSIS — R609 Edema, unspecified: Secondary | ICD-10-CM

## 2016-09-22 DIAGNOSIS — Q6621 Congenital metatarsus primus varus: Secondary | ICD-10-CM | POA: Diagnosis not present

## 2016-10-06 ENCOUNTER — Ambulatory Visit (INDEPENDENT_AMBULATORY_CARE_PROVIDER_SITE_OTHER): Payer: PPO | Admitting: Family Medicine

## 2016-10-06 ENCOUNTER — Encounter: Payer: Self-pay | Admitting: Family Medicine

## 2016-10-06 VITALS — BP 110/68 | HR 68 | Temp 98.1°F | Resp 16 | Wt 202.0 lb

## 2016-10-06 DIAGNOSIS — M1990 Unspecified osteoarthritis, unspecified site: Secondary | ICD-10-CM

## 2016-10-06 DIAGNOSIS — R0602 Shortness of breath: Secondary | ICD-10-CM | POA: Diagnosis not present

## 2016-10-06 DIAGNOSIS — Z23 Encounter for immunization: Secondary | ICD-10-CM | POA: Diagnosis not present

## 2016-10-06 DIAGNOSIS — J449 Chronic obstructive pulmonary disease, unspecified: Secondary | ICD-10-CM

## 2016-10-06 MED ORDER — MELOXICAM 7.5 MG PO TABS: 7.5000 mg | ORAL_TABLET | Freq: Every day | ORAL | 3 refills | Status: DC

## 2016-10-06 MED ORDER — BUDESONIDE-FORMOTEROL FUMARATE 160-4.5 MCG/ACT IN AERO: 2.0000 | INHALATION_SPRAY | Freq: Two times a day (BID) | RESPIRATORY_TRACT | 3 refills | Status: DC

## 2016-10-06 NOTE — Progress Notes (Signed)
Patient: Teresa [name], Female    DOB: 1941/08/09, 74 y.o.   MRN: [id number] Visit Date: 10/06/2016  Today's Provider: [name], MD   Chief Complaint  Patient presents with  . Annual Exam   Subjective:   Teresa [name] is a 75 y.o. female who presents today for her Subsequent Annual Wellness Visit. She feels well. She reports exercising none. She reports she is sleeping fairly well. She does have chronic shortness of breath with mild dyspnea on exertion. This is slowly gotten worse over the past few years. She definitely seems to notice it more this year. She has no chest pain, diaphoresis, or any syncope or presyncope. She has chronic arthritic and degenerative disc disease which causes some back pain.  Health Maintenance, patient has had the following; 04/10/2008 Colonoscopy 08/09/2008 Pap Smear 05/01/16 Mammogram 07/08/16 Met C, TSH, CBC, Lipid panel Immunization History  Administered Date(s) Administered  . Influenza, High Dose Seasonal PF 10/04/2015  . Influenza-Unspecified 07/22/2014   Patient is due the following Tdap, Zoster, Pneumonia and Flu  vaccines, Dexa scan   Review of Systems  Constitutional: Positive for diaphoresis.  HENT: Positive for tinnitus.   Eyes: Positive for photophobia and redness.  Respiratory: Positive for shortness of breath and wheezing.   Endocrine: Negative.   Genitourinary: Positive for frequency.  Musculoskeletal: Positive for arthralgias, back pain, joint swelling, neck pain and neck stiffness.  Skin: Positive for rash.  Allergic/Immunologic: Positive for immunocompromised state.  Hematological: Negative.   Psychiatric/Behavioral: Negative.     Patient Active Problem List   Diagnosis Date Noted  . Psoriasis 07/16/2015  . Hyperlipidemia 03/29/2015  . Pre-syncope 03/04/2015  . CAD (coronary artery disease)   . PVC's (premature ventricular contractions)   . Ischemic cardiomyopathy   . DDD (degenerative disc disease), lumbar  01/18/2015  . L-S radiculopathy 07/03/2014  . Breast CA ([place]) 06/28/2014  . Arthritis, degenerative 06/28/2014  . Acid reflux 03/08/2014    Social History   Social History  . Marital status: Divorced    Spouse name: N/A  . Number of children: N/A  . Years of education: N/A   Occupational History  . Not on file.   Social History Main Topics  . Smoking status: Former Smoker    Packs/day: 0.50    Years: 35.00    Types: Cigarettes  . Smokeless tobacco: Never Used     Comment: Quit approx 1990  . Alcohol use No  . Drug use: No  . Sexual activity: Not on file   Other Topics Concern  . Not on file   Social History Narrative  . No narrative on file    Past Surgical History:  Procedure Laterality Date  . ABDOMINAL HYSTERECTOMY    . BREAST BIOPSY Right    1991 negative  . BREAST EXCISIONAL BIOPSY Left    2009 positive  . BREAST LUMPECTOMY    . CARDIAC CATHETERIZATION     [place]    . CORONARY ARTERY BYPASS GRAFT  1994   1 vessel - [facility]  . FOOT ARTHRODESIS Right 07/24/2016   Procedure: FUSION FIRST METATARSAL CUNEIFORM JOINT RIGHT FOOT, FUSION SECOND METATARSAL CUNEFORM JOINT BUNION REPAIR RIGHT FOOT;  Surgeon: Albertine Patricia, DPM;  Location: [facility];  Service: Podiatry;  Laterality: Right;  . FOOT FRACTURE SURGERY    . HARDWARE REMOVAL Right 07/24/2016   Procedure: REMOVAL HARDWARE LATERAL MALLEOUS RIGHT ANKLE;  Surgeon: Albertine Patricia,  DPM;  Location: [facility];  Service: Podiatry;  Laterality: Right;  REMOVAL OF PIN WHICH WAS INTACT  . TOTAL HIP ARTHROPLASTY Left   . TOTAL KNEE ARTHROPLASTY Left     Her family history includes Arthritis in her brother, sister, and sister; Bladder Cancer in her brother; Breast cancer in her mother, sister, and sister; Congestive Heart Failure in her mother; Heart attack in her father; Kidney failure in her sister; Lung cancer in her sister; Prostate cancer in her  brother.    Outpatient Encounter Prescriptions as of 10/06/2016  Medication Sig Note  . aspirin EC 81 MG tablet Take 1 tablet (81 mg total) by mouth daily.   . Budesonide-Formoterol Fumarate (SYMBICORT IN) Inhale into the lungs as needed.   . diphenhydrAMINE (BENADRYL) 25 MG tablet Take 25 mg by mouth every 6 (six) hours as needed.   . fluocinonide cream (LIDEX) 3.83 % Apply 1 application topically 2 (two) times daily as needed.   . fluticasone (FLONASE) 50 MCG/ACT nasal spray Place 1 spray into both nostrils daily.   . folic acid (FOLVITE) 1 MG tablet Take by mouth. 07/08/2016: Received from: [facility]  . MAGNESIUM CITRATE PO Take by mouth.   . meclizine (ANTIVERT) 25 MG tablet Take 25 mg by mouth as needed.  07/24/2016: PRN   . metoprolol succinate (TOPROL-XL) 25 MG 24 hr tablet TAKE ONE TABLET BY MOUTH ONCE DAILY   . montelukast (SINGULAIR) 10 MG tablet Take 10 mg by mouth daily.  05/01/2015: Received from: External Pharmacy Received Sig:   . Multiple Vitamin (MULTIVITAMIN) tablet Take 1 tablet by mouth daily.   . Omega-3 Fatty Acids (FISH OIL PO) Take by mouth daily.   Marland Kitchen omeprazole (PRILOSEC) 20 MG capsule Take 1 capsule (20 mg total) by mouth daily.   Marland Kitchen oxymetazoline (AFRIN) 0.05 % nasal spray Place 1 spray into both nostrils 2 (two) times daily as needed for congestion. 07/24/2016: PRN  . venlafaxine (EFFEXOR) 75 MG tablet TAKE TWO TABLETS BY MOUTH ONCE DAILY   . Vitamin D, Ergocalciferol, (DRISDOL) 50000 units CAPS capsule Take 50,000 Units by mouth every 7 (seven) days.   . [DISCONTINUED] enoxaparin (LOVENOX) 40 MG/0.4ML injection Inject 0.4 mLs (40 mg total) into the skin daily.   . [DISCONTINUED] methotrexate (RHEUMATREX) 2.5 MG tablet Take 15 mg by mouth once a week.   . [DISCONTINUED] oxyCODONE-acetaminophen (PERCOCET) 7.5-325 MG tablet Take 1 tablet by mouth every 4 (four) hours as needed for severe pain.    No facility-administered encounter medications on file as  of 10/06/2016.     Allergies  Allergen Reactions  . Codeine     Altered mental status  . Ivp Dye [Iodinated Diagnostic Agents] Swelling  . Sulfa Antibiotics Hives    Patient Care Team: Jerrol Banana., MD as PCP - General (Family Medicine)  Objective:   Vitals:  Vitals:   10/06/16 1438  BP: 110/68  Pulse: 68  Resp: 16  Temp: 98.1 F (36.7 C)  TempSrc: Oral  Weight: 202 lb (91.6 kg)    Physical Exam  Constitutional: She is oriented to person, place, and time. She appears well-developed and well-nourished.  HENT:  Head: Normocephalic and atraumatic.  Right Ear: External ear normal.  Left Ear: External ear normal.  Nose: Nose normal.  Eyes: Conjunctivae and EOM are normal. Pupils are equal, round, and reactive to light.  Neck: Normal range of motion. Neck supple.  Cardiovascular: Normal rate, regular rhythm, normal heart sounds and  intact distal pulses.   Pulmonary/Chest: Effort normal and breath sounds normal.  Abdominal: Soft. Bowel sounds are normal.  Musculoskeletal: Normal range of motion.  Neurological: She is alert and oriented to person, place, and time.  Skin: Skin is warm and dry.  Psychiatric: She has a normal mood and affect. Her behavior is normal. Thought content normal.    Activities of Daily Living In your present state of health, do you have any difficulty performing the following activities: 10/06/2016 07/24/2016  Hearing? N N  Vision? N N  Difficulty concentrating or making decisions? Y N  Walking or climbing stairs? Y N  Dressing or bathing? N N  Doing errands, shopping? N -  Some recent data might be hidden    Fall Risk Assessment Fall Risk  10/06/2016 10/04/2015 07/16/2015  Falls in the past year? No No No     Depression Screen PHQ 2/9 Scores 10/06/2016 10/04/2015 07/16/2015  PHQ - 2 Score 0 0 0   Current Exercise Habits: The patient does not participate in regular exercise at present     Cognitive Testing - 6-CIT    Year: 0  4 points  Month: 0 3 points  Memorize "Pia Mau, [address], [place]"  Time (within 1 hour:) 0 3 points  Count backwards from 20: 0 2 4 points  Name months of year: 0 2 4 points  Repeat Address: 0 _0 points   Total Score: 2/28  Interpretation : Normal (0-7) Abnormal (8-28)    Assessment & Plan:     Annual Wellness Visit  Reviewed patient's Family Medical History Reviewed and updated list of patient's medical providers Assessment of cognitive impairment was done Assessed patient's functional ability Established a written schedule for health screening [place] Completed and Reviewed  Exercise Activities and Dietary recommendations Goals    None      Immunization History  Administered Date(s) Administered  . Influenza, High Dose Seasonal PF 10/04/2015  . Influenza-Unspecified 07/22/2014    Health Maintenance  Topic Date Due  . TETANUS/TDAP  09/19/1960  . ZOSTAVAX  09/19/2001  . DEXA SCAN  09/19/2006  . PNA vac Low Risk Adult (1 of 2 - PCV13) 09/19/2006  . INFLUENZA VACCINE  07/22/2016  . COLONOSCOPY  04/10/2018     Discussed health benefits of physical activity, and encouraged her to engage in regular exercise appropriate for her age and condition.    1. Shortness of breath  - Spirometry with Graph  2. Arthritis  - meloxicam (MOBIC) 7.5 MG tablet; Take 1 tablet (7.5 mg total) by mouth daily.  Dispense: 30 tablet; Refill: 3  3. Chronic obstructive pulmonary disease, unspecified COPD type [facility]) May need referral to pulmonary. Return to clinic 2-3 weeks on Symbicort. FEV1 is 76% today - budesonide-formoterol (SYMBICORT) 160-4.5 MCG/ACT inhaler; Inhale 2 puffs into the lungs 2 (two) times daily.  Dispense: 1 Inhaler; Refill: 3  4. Need for vaccination with 13-polyvalent pneumococcal conjugate vaccine  - Pneumococcal conjugate vaccine 13-valent IM  5. Need for influenza vaccination  - Flu vaccine HIGH DOSE PF (Fluzone High  dose) 6. CAD/status post CABG in 1994 Follow up in 2 months    HPI, Exam and A&P Transcribed under the direction and in the presence of Miguel Aschoff, Brooke Bonito., MD. Electronically Signed: Althea Charon, RMA I have done the exam and reviewed the chart and it is accurate to the best of my knowledge. Miguel Aschoff M.D. [facility]

## 2016-10-13 DIAGNOSIS — Q6621 Congenital metatarsus primus varus: Secondary | ICD-10-CM | POA: Diagnosis not present

## 2016-11-05 DIAGNOSIS — Z961 Presence of intraocular lens: Secondary | ICD-10-CM | POA: Diagnosis not present

## 2016-11-24 DIAGNOSIS — G894 Chronic pain syndrome: Secondary | ICD-10-CM | POA: Diagnosis not present

## 2016-11-24 DIAGNOSIS — M180 Bilateral primary osteoarthritis of first carpometacarpal joints: Secondary | ICD-10-CM | POA: Diagnosis not present

## 2016-11-24 DIAGNOSIS — L409 Psoriasis, unspecified: Secondary | ICD-10-CM | POA: Diagnosis not present

## 2016-11-24 DIAGNOSIS — M151 Heberden's nodes (with arthropathy): Secondary | ICD-10-CM | POA: Diagnosis not present

## 2016-11-25 DIAGNOSIS — M79671 Pain in right foot: Secondary | ICD-10-CM | POA: Diagnosis not present

## 2016-11-25 DIAGNOSIS — Q6621 Congenital metatarsus primus varus: Secondary | ICD-10-CM | POA: Diagnosis not present

## 2016-12-04 ENCOUNTER — Ambulatory Visit: Payer: PPO | Admitting: Family Medicine

## 2016-12-05 DIAGNOSIS — M546 Pain in thoracic spine: Secondary | ICD-10-CM | POA: Diagnosis not present

## 2016-12-23 ENCOUNTER — Encounter: Payer: Self-pay | Admitting: Physician Assistant

## 2016-12-23 ENCOUNTER — Ambulatory Visit (INDEPENDENT_AMBULATORY_CARE_PROVIDER_SITE_OTHER): Payer: PPO | Admitting: Physician Assistant

## 2016-12-23 VITALS — BP 128/68 | HR 64 | Temp 97.6°F | Resp 16 | Wt 198.0 lb

## 2016-12-23 DIAGNOSIS — K219 Gastro-esophageal reflux disease without esophagitis: Secondary | ICD-10-CM

## 2016-12-23 DIAGNOSIS — R1032 Left lower quadrant pain: Secondary | ICD-10-CM | POA: Diagnosis not present

## 2016-12-23 MED ORDER — OMEPRAZOLE 20 MG PO CPDR: 20.0000 mg | DELAYED_RELEASE_CAPSULE | Freq: Every day | ORAL | 3 refills | Status: DC

## 2016-12-23 MED ORDER — AMOXICILLIN-POT CLAVULANATE 875-125 MG PO TABS: 1.0000 | ORAL_TABLET | Freq: Two times a day (BID) | ORAL | 0 refills | Status: AC

## 2016-12-23 NOTE — Progress Notes (Signed)
Patient: Teresa [name] Female    DOB: 25-Feb-1941   75 y.o.   MRN: [id number] Visit Date: 12/23/2016  Today's Provider: Trinna Post, PA-C   Chief Complaint  Patient presents with  . Abdominal Pain    Lower left side; started Thursday.   Subjective:    Abdominal Pain  This is a new problem. The current episode started in the past 7 days. The problem has been gradually improving. The pain is located in the LLQ. The pain is at a severity of 5/10 (Pt reports it was as high as 10/10). The quality of the pain is sharp and tearing. The abdominal pain does not radiate. Associated symptoms include constipation. Pertinent negatives include no diarrhea, dysuria, fever, frequency, hematuria, nausea or vomiting. Relieved by: Milk of Mag;  The treatment provided mild relief. Her past medical history is significant for abdominal surgery.   Patient is a 76 y/o female with colonoscopy in 2009 with diverticuloses presenting today with the above symptoms. Has been able to have a bowel movement. No melena or hematochezia. No nausea and no vomiting. No urinary symptoms. No vaginal pain or bleeding. Has been having some cold chills. Feels pain in her LLQ when she is walking.   Patient has acid reflux and would like an Omeprazole refill. Does not take omeprazole every day.   Patient with history of psoriatic arthritis and DDD in lumbar spine s/p L3-L5 fusion presenting for renewal of disability plaquard for her car.    Allergies  Allergen Reactions  . Codeine     Altered mental status  . Ivp Dye [Iodinated Diagnostic Agents] Swelling  . Sulfa Antibiotics Hives     Current Outpatient Prescriptions:  .  aspirin EC 81 MG tablet, Take 1 tablet (81 mg total) by mouth daily., Disp: 90 tablet, Rfl: 3 .  budesonide-formoterol (SYMBICORT) 160-4.5 MCG/ACT inhaler, Inhale 2 puffs into the lungs 2 (two) times daily., Disp: 1 Inhaler, Rfl: 3 .  Budesonide-Formoterol Fumarate (SYMBICORT IN), Inhale into  the lungs as needed., Disp: , Rfl:  .  diphenhydrAMINE (BENADRYL) 25 MG tablet, Take 25 mg by mouth every 6 (six) hours as needed., Disp: , Rfl:  .  fluocinonide cream (LIDEX) [id number] %, Apply 1 application topically 2 (two) times daily as needed., Disp: , Rfl:  .  fluticasone (FLONASE) 50 MCG/ACT nasal spray, Place 1 spray into both nostrils daily., Disp: , Rfl:  .  folic acid (FOLVITE) 1 MG tablet, Take by mouth., Disp: , Rfl:  .  MAGNESIUM CITRATE PO, Take by mouth., Disp: , Rfl:  .  meclizine (ANTIVERT) 25 MG tablet, Take 25 mg by mouth as needed. , Disp: , Rfl: 1 .  meloxicam (MOBIC) 7.5 MG tablet, Take 1 tablet (7.5 mg total) by mouth daily., Disp: 30 tablet, Rfl: 3 .  metoprolol succinate (TOPROL-XL) 25 MG 24 hr tablet, TAKE ONE TABLET BY MOUTH ONCE DAILY, Disp: 90 tablet, Rfl: 3 .  montelukast (SINGULAIR) 10 MG tablet, Take 10 mg by mouth daily. , Disp: , Rfl: 12 .  Multiple Vitamin (MULTIVITAMIN) tablet, Take 1 tablet by mouth daily., Disp: , Rfl:  .  Omega-3 Fatty Acids (FISH OIL PO), Take by mouth daily., Disp: , Rfl:  .  omeprazole (PRILOSEC) 20 MG capsule, Take 1 capsule (20 mg total) by mouth daily., Disp: 30 capsule, Rfl: 3 .  oxymetazoline (AFRIN) 0.05 % nasal spray, Place 1 spray into both nostrils 2 (two) times daily as needed  for congestion., Disp: , Rfl:  .  venlafaxine (EFFEXOR) 75 MG tablet, TAKE TWO TABLETS BY MOUTH ONCE DAILY, Disp: 60 tablet, Rfl: 6 .  Vitamin D, Ergocalciferol, (DRISDOL) 50000 units CAPS capsule, Take 50,000 Units by mouth every 7 (seven) days., Disp: , Rfl:   Review of Systems  Constitutional: Positive for diaphoresis (Pt reports it has happened a few night. ). Negative for activity change, appetite change, chills, fatigue, fever and unexpected weight change.  Gastrointestinal: Positive for abdominal pain and constipation. Negative for abdominal distention, anal bleeding, blood in stool, diarrhea, nausea, rectal pain and vomiting.  Genitourinary: Negative  for decreased urine volume, difficulty urinating, dyspareunia, dysuria, flank pain, frequency, hematuria, urgency, vaginal bleeding, vaginal discharge and vaginal pain.    Social History  Substance Use Topics  . Smoking status: Former Smoker    Packs/day: 0.50    Years: 35.00    Types: Cigarettes  . Smokeless tobacco: Former Systems developer    Quit date: 12/22/1978     Comment: Quit approx 1990  . Alcohol use No   Objective:   BP 128/68 (BP Location: Right Arm, Patient Position: Sitting, Cuff Size: Large)   Pulse 64   Temp 97.6 F (36.4 C) (Oral)   Resp 16   Wt 198 lb (89.8 kg)   BMI 33.99 kg/m   Physical Exam  Constitutional: She is oriented to person, place, and time. She appears well-developed and well-nourished.  Non-toxic appearance.  Some discomfort upon walking to exam table.  Cardiovascular: Normal rate.   Pulmonary/Chest: Effort normal and breath sounds normal.  Abdominal: Soft. Bowel sounds are normal. She exhibits no distension, no fluid wave and no mass. There is no hepatosplenomegaly, splenomegaly or hepatomegaly. There is tenderness in the left lower quadrant. There is no rebound, no guarding, no CVA tenderness, no tenderness at McBurney's point and negative Murphy's sign.  Neurological: She is alert and oriented to person, place, and time.  Skin: Skin is warm and dry.  Psychiatric: She has a normal mood and affect. Her behavior is normal.        Assessment & Plan:     1. LLQ pain  Patient with history of diverticuloses on colonoscopy from 2009 concerning for diverticulitis. No urinary symptoms, no vaginal symptoms. Patient is afebrile and nontoxic looking in office today. Will get lab as below. Will start treatment with Augmentin as below, advised patient on clear liquid diet. Would like to see patient back in two days to re-evaluate, go over labs, and determine if imaging is appropriate at that point.  - Comprehensive metabolic panel - CBC with Differential/Platelet -  amoxicillin-clavulanate (AUGMENTIN) 875-125 MG tablet; Take 1 tablet by mouth 2 (two) times daily.  Dispense: 14 tablet; Refill: 0  2. Gastroesophageal reflux disease, esophagitis presence not specified Refilled Prilosec - omeprazole (PRILOSEC) 20 MG capsule; Take 1 capsule (20 mg total) by mouth daily.  Dispense: 30 capsule; Refill: 3  3. Filled out Handicap Sign  The entirety of the information documented in the History of Present Illness, Review of Systems and Physical Exam were personally obtained by me. Portions of this information were initially documented by Ashley Royalty, CMA and reviewed by me for thoroughness and accuracy.   There are no Patient Instructions on file for this visit.  Return in about 2 days (around 12/25/2016) for LLQ pain.        Trinna Post, PA-C  [facility]

## 2016-12-23 NOTE — Patient Instructions (Signed)
Diverticulitis °Diverticulitis is when small pockets that have formed in your colon (large intestine) become infected or swollen. °Follow these instructions at home: °· Follow your doctor's instructions. °· Follow a special diet if told by your doctor. °· When you feel better, your doctor may tell you to change your diet. You may be told to eat a lot of fiber. Fruits and vegetables are good sources of fiber. Fiber makes it easier to poop (have bowel movements). °· Take supplements or probiotics as told by your doctor. °· Only take medicines as told by your doctor. °· Keep all follow-up visits with your doctor. °Contact a doctor if: °· Your pain does not get better. °· You have a hard time eating food. °· You are not pooping like normal. °Get help right away if: °· Your pain gets worse. °· Your problems do not get better. °· Your problems suddenly get worse. °· You have a fever. °· You keep throwing up (vomiting). °· You have bloody or black, tarry poop (stool). °This information is not intended to replace advice given to you by your health care provider. Make sure you discuss any questions you have with your health care provider. °Document Released: 05/26/2008 Document Revised: 05/15/2016 Document Reviewed: 11/02/2013 °Elsevier Interactive Patient Education © 2017 [employer] ° °

## 2016-12-25 ENCOUNTER — Encounter: Payer: Self-pay | Admitting: Physician Assistant

## 2016-12-25 ENCOUNTER — Ambulatory Visit (INDEPENDENT_AMBULATORY_CARE_PROVIDER_SITE_OTHER): Payer: PPO | Admitting: Physician Assistant

## 2016-12-25 VITALS — BP 138/64 | HR 68 | Temp 98.0°F | Resp 16 | Wt 198.0 lb

## 2016-12-25 DIAGNOSIS — R319 Hematuria, unspecified: Secondary | ICD-10-CM

## 2016-12-25 DIAGNOSIS — R109 Unspecified abdominal pain: Secondary | ICD-10-CM | POA: Diagnosis not present

## 2016-12-25 DIAGNOSIS — R110 Nausea: Secondary | ICD-10-CM | POA: Diagnosis not present

## 2016-12-25 DIAGNOSIS — R1032 Left lower quadrant pain: Secondary | ICD-10-CM | POA: Diagnosis not present

## 2016-12-25 DIAGNOSIS — R3915 Urgency of urination: Secondary | ICD-10-CM

## 2016-12-25 LAB — POCT URINALYSIS DIPSTICK
Bilirubin, UA: NEGATIVE
Glucose, UA: NEGATIVE
Ketones, UA: NEGATIVE
Leukocytes, UA: NEGATIVE
Nitrite, UA: NEGATIVE
Protein, UA: NEGATIVE
Spec Grav, UA: <=1.005
Urobilinogen, UA: 0.2
pH, UA: 6.5

## 2016-12-25 MED ORDER — ONDANSETRON HCL 4 MG PO TABS: 4.0000 mg | ORAL_TABLET | Freq: Three times a day (TID) | ORAL | 0 refills | Status: DC | PRN

## 2016-12-25 MED ORDER — HYDROCODONE-ACETAMINOPHEN 5-325 MG PO TABS: 1.0000 | ORAL_TABLET | Freq: Four times a day (QID) | ORAL | 0 refills | Status: AC | PRN

## 2016-12-25 NOTE — Patient Instructions (Signed)

## 2016-12-25 NOTE — Progress Notes (Signed)
Patient: Teresa [name] Female    DOB: Apr 23, 1941   76 y.o.   MRN: [id number] Visit Date: 12/25/2016  Today's Provider: Trinna Post, PA-C   Chief Complaint  Patient presents with  . Abdominal Pain    Left Lower Quadrant 2 day follow up   Subjective:    Abdominal Pain  This is a new problem. The current episode started 1 to 4 weeks ago. The problem has been unchanged. The pain is located in the LLQ. Associated symptoms include constipation and frequency. Pertinent negatives include no diarrhea, dysuria, fever, hematuria, nausea or vomiting. She has tried antibiotics for the symptoms. The treatment provided no relief.   Patient is here for follow up of LLQ pain 2 days ago for suspected diverticulitis. She has been on Augmentin for the past two days without relief. She did not get her lab work because she did not have a print out sheet and thought she could not get them.   She feels her pain has worsened and now is coming from her left flank area down into her groin, where she feels a fullness. She says she had a kidney stone remotely, and did give a urine sample today. She is nauseated today. She does not have a fever. She had a bowel movement morning that was non bloody.     Allergies  Allergen Reactions  . Codeine     Altered mental status  . Ivp Dye [Iodinated Diagnostic Agents] Swelling  . Sulfa Antibiotics Hives     Current Outpatient Prescriptions:  .  amoxicillin-clavulanate (AUGMENTIN) 875-125 MG tablet, Take 1 tablet by mouth 2 (two) times daily., Disp: 14 tablet, Rfl: 0 .  aspirin EC 81 MG tablet, Take 1 tablet (81 mg total) by mouth daily., Disp: 90 tablet, Rfl: 3 .  budesonide-formoterol (SYMBICORT) 160-4.5 MCG/ACT inhaler, Inhale 2 puffs into the lungs 2 (two) times daily., Disp: 1 Inhaler, Rfl: 3 .  Budesonide-Formoterol Fumarate (SYMBICORT IN), Inhale into the lungs as needed., Disp: , Rfl:  .  diphenhydrAMINE (BENADRYL) 25 MG tablet, Take 25 mg by mouth  every 6 (six) hours as needed., Disp: , Rfl:  .  fluocinonide cream (LIDEX) [id number] %, Apply 1 application topically 2 (two) times daily as needed., Disp: , Rfl:  .  fluticasone (FLONASE) 50 MCG/ACT nasal spray, Place 1 spray into both nostrils daily., Disp: , Rfl:  .  folic acid (FOLVITE) 1 MG tablet, Take by mouth., Disp: , Rfl:  .  MAGNESIUM CITRATE PO, Take by mouth., Disp: , Rfl:  .  meclizine (ANTIVERT) 25 MG tablet, Take 25 mg by mouth as needed. , Disp: , Rfl: 1 .  meloxicam (MOBIC) 7.5 MG tablet, Take 1 tablet (7.5 mg total) by mouth daily., Disp: 30 tablet, Rfl: 3 .  metoprolol succinate (TOPROL-XL) 25 MG 24 hr tablet, TAKE ONE TABLET BY MOUTH ONCE DAILY, Disp: 90 tablet, Rfl: 3 .  montelukast (SINGULAIR) 10 MG tablet, Take 10 mg by mouth daily. , Disp: , Rfl: 12 .  Multiple Vitamin (MULTIVITAMIN) tablet, Take 1 tablet by mouth daily., Disp: , Rfl:  .  Omega-3 Fatty Acids (FISH OIL PO), Take by mouth daily., Disp: , Rfl:  .  omeprazole (PRILOSEC) 20 MG capsule, Take 1 capsule (20 mg total) by mouth daily., Disp: 30 capsule, Rfl: 3 .  oxymetazoline (AFRIN) 0.05 % nasal spray, Place 1 spray into both nostrils 2 (two) times daily as needed for congestion., Disp: , Rfl:  .  venlafaxine (EFFEXOR) 75 MG tablet, TAKE TWO TABLETS BY MOUTH ONCE DAILY, Disp: 60 tablet, Rfl: 6 .  Vitamin D, Ergocalciferol, (DRISDOL) 50000 units CAPS capsule, Take 50,000 Units by mouth every 7 (seven) days., Disp: , Rfl:  .  HYDROcodone-acetaminophen (NORCO/VICODIN) 5-325 MG tablet, Take 1 tablet by mouth every 6 (six) hours as needed for moderate pain., Disp: 20 tablet, Rfl: 0 .  ondansetron (ZOFRAN) 4 MG tablet, Take 1 tablet (4 mg total) by mouth every 8 (eight) hours as needed for nausea or vomiting., Disp: 20 tablet, Rfl: 0  Review of Systems  Constitutional: Positive for fatigue. Negative for activity change, appetite change, chills, diaphoresis, fever and unexpected weight change.  Gastrointestinal: Positive  for abdominal pain and constipation. Negative for abdominal distention, anal bleeding, blood in stool, diarrhea, nausea, rectal pain and vomiting.  Genitourinary: Positive for frequency and urgency. Negative for decreased urine volume, difficulty urinating, dyspareunia, dysuria, enuresis, flank pain, genital sores, hematuria, menstrual problem, pelvic pain, vaginal bleeding, vaginal discharge and vaginal pain.    Social History  Substance Use Topics  . Smoking status: Former Smoker    Packs/day: 0.50    Years: 35.00    Types: Cigarettes  . Smokeless tobacco: Former Systems developer    Quit date: 12/22/1978     Comment: Quit approx 1990  . Alcohol use No   Objective:   BP 138/64 (BP Location: Left Arm, Patient Position: Sitting, Cuff Size: Large)   Pulse 68   Temp 98 F (36.7 C) (Oral)   Resp 16   Wt 198 lb (89.8 kg)   BMI 33.99 kg/m   Physical Exam  Constitutional: She appears well-developed and well-nourished.  Non-toxic appearance. No distress.  Cardiovascular: Normal rate.   Pulmonary/Chest: Effort normal.  Abdominal: Soft. Bowel sounds are normal. She exhibits no distension. There is tenderness in the left lower quadrant. There is no rigidity, no rebound, no guarding, no CVA tenderness, no tenderness at McBurney's point and negative Murphy's sign.  Neurological: She is alert.  Skin: Skin is warm and dry.  Psychiatric: She has a normal mood and affect. Her behavior is normal.        Assessment & Plan:     1. Urgency of urination  Did get urinalysis which showed some hematuria and no other abnormalities. Will send for culture and Ua. Patient did not obtain labs so I am unable to say for certain she does not have a white count and for this reason, I have instructed her to remain on her antibiotics. I will have her get the labs today. I have given patient Vicodin for pain as below. Patient declines taking Mobic because she says it makes her bruise. Hesitant to give ketoralac 2/2 age.  Codeine listed as allergy in chart with reaction of altered mental status. 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 Have counseled her that this is not a true allergy but rather a side effect and counseled patient on risks of narcotic. Patient agrees to this and says she would like the Vicodin. I have also scheduled a CT abdomen/pelvis to distinguish between renal stone and diverticulitis. Also gave anti-emetic. Patient counseled on return precautions. Discussed this patient with Dr. [name]. Will contact patient regarding labs and imaging.  - POCT urinalysis dipstick - Urine Culture - Urinalysis, Routine w reflex microscopic  2. Hematuria, unspecified type  See above.  -  Urine Culture - Urinalysis, Routine w reflex microscopic - CT ABDOMEN PELVIS W WO CONTRAST; Future  3. Acute left flank pain  See above.  - HYDROcodone-acetaminophen (NORCO/VICODIN) 5-325 MG tablet; Take 1 tablet by mouth every 6 (six) hours as needed for moderate pain.  Dispense: 20 tablet; Refill: 0 - CT ABDOMEN PELVIS W WO CONTRAST; Future  4. Nausea  See above.  - ondansetron (ZOFRAN) 4 MG tablet; Take 1 tablet (4 mg total) by mouth every 8 (eight) hours as needed for nausea or vomiting.  Dispense: 20 tablet; Refill: 0 - CT ABDOMEN PELVIS W WO CONTRAST; Future  Return if symptoms worsen or fail to improve.  Patient Instructions  Kidney Stones Kidney stones (urolithiasis) are rock-like masses that form inside of the kidneys. Kidneys are organs that make pee (urine). A kidney stone can cause very bad pain and can block the flow of pee. The stone usually leaves your body (passes) through your pee. You may need to have a doctor take out the stone. Follow these instructions at home: Eating and drinking  Drink enough fluid to keep your pee clear or pale yellow. This will help you pass the  stone.  If told by your doctor, change the foods you eat (your diet). This may include:  Limiting how much salt (sodium) you eat.  Eating more fruits and vegetables.  Limiting how much meat, poultry, fish, and eggs you eat.  Follow instructions from your doctor about eating or drinking restrictions. General instructions  Collect pee samples as told by your doctor. You may need to collect a pee sample:  24 hours after a stone comes out.  8-12 weeks after a stone comes out, and every 6-12 months after that.  Strain your pee every time you pee (urinate), for as long as told. Use the strainer that your doctor recommends.  Do not throw out the stone. Keep it so that it can be tested by your doctor.  Take over-the-counter and prescription medicines only as told by your doctor.  Keep all follow-up visits as told by your doctor. This is important. You may need follow-up tests. Preventing kidney stones To prevent another kidney stone:  Drink enough fluid to keep your pee clear or pale yellow. This is the best way to prevent kidney stones.  Eat healthy foods.  Avoid certain foods as told by your doctor. You may be told to eat less protein.  Stay at a healthy weight. Contact a doctor if:  You have pain that gets worse or does not get better with medicine. Get help right away if:  You have a fever or chills.  You get very bad pain.  You get new pain in your belly (abdomen).  You pass out (faint).  You cannot pee. This information is not intended to replace advice given to you by your health care provider. Make sure you discuss any questions you have with your health care provider. Document Released: 05/26/2008 Document Revised: 08/26/2016 Document Reviewed: 08/26/2016 Elsevier Interactive Patient Education  2017 Fleming, PA-C  [facility]

## 2016-12-26 ENCOUNTER — Telehealth: Payer: Self-pay

## 2016-12-26 LAB — URINALYSIS, ROUTINE W REFLEX MICROSCOPIC
Bilirubin, UA: NEGATIVE
Glucose, UA: NEGATIVE
Ketones, UA: NEGATIVE
Leukocytes, UA: NEGATIVE
Nitrite, UA: NEGATIVE
Protein, UA: NEGATIVE
RBC, UA: NEGATIVE
Specific Gravity, UA: 1.007 (ref 1.005–1.030)
Urobilinogen, Ur: 0.2 mg/dL (ref 0.2–1.0)
pH, UA: 6.5 (ref 5.0–7.5)

## 2016-12-26 NOTE — Telephone Encounter (Signed)
Advised pt of lab results. Pt verbally acknowledges understanding. Emily Drozdowski, CMA   

## 2016-12-26 NOTE — Telephone Encounter (Signed)
-----   Message from Trinna Post, [place] sent at 12/26/2016  2:34 PM EST ----- No white count on CBC, metabolic panel looks good. Urine sent out to lab actually did not show blood. Patient should complete antibiotics as clinical picture is still a bit unclear. We will wait to see what CT image says. For now, push lots of fluid.

## 2016-12-27 LAB — CBC WITH DIFFERENTIAL/PLATELET
Basophils Absolute: 0 10*3/uL (ref 0.0–0.2)
Basos: 0 %
EOS (ABSOLUTE): 0.2 10*3/uL (ref 0.0–0.4)
Eos: 2 %
Hematocrit: 44.9 % (ref 34.0–46.6)
Hemoglobin: 14.6 g/dL (ref 11.1–15.9)
Immature Grans (Abs): 0 10*3/uL (ref 0.0–0.1)
Immature Granulocytes: 0 %
Lymphocytes Absolute: 1.7 10*3/uL (ref 0.7–3.1)
Lymphs: 19 %
MCH: 30.1 pg (ref 26.6–33.0)
MCHC: 32.5 g/dL (ref 31.5–35.7)
MCV: 93 fL (ref 79–97)
Monocytes Absolute: 1 10*3/uL — ABNORMAL HIGH (ref 0.1–0.9)
Monocytes: 11 %
Neutrophils Absolute: 6.3 10*3/uL (ref 1.4–7.0)
Neutrophils: 68 %
Platelets: 309 10*3/uL (ref 150–379)
RBC: 4.85 x10E6/uL (ref 3.77–5.28)
RDW: 14 % (ref 12.3–15.4)
WBC: 9.3 10*3/uL (ref 3.4–10.8)

## 2016-12-27 LAB — COMPREHENSIVE METABOLIC PANEL
ALT: 11 IU/L (ref 0–32)
AST: 14 IU/L (ref 0–40)
Albumin/Globulin Ratio: 1.5 (ref 1.2–2.2)
Albumin: 3.8 g/dL (ref 3.5–4.8)
Alkaline Phosphatase: 102 IU/L (ref 39–117)
BUN/Creatinine Ratio: 15 (ref 12–28)
BUN: 11 mg/dL (ref 8–27)
Bilirubin Total: 0.5 mg/dL (ref 0.0–1.2)
CO2: 25 mmol/L (ref 18–29)
Calcium: 9.4 mg/dL (ref 8.7–10.3)
Chloride: 99 mmol/L (ref 96–106)
Creatinine, Ser: 0.71 mg/dL (ref 0.57–1.00)
GFR calc Af Amer: 96 mL/min/{1.73_m2} (ref >59)
GFR calc non Af Amer: 84 mL/min/{1.73_m2} (ref >59)
Globulin, Total: 2.6 g/dL (ref 1.5–4.5)
Glucose: 82 mg/dL (ref 65–99)
Potassium: 4.3 mmol/L (ref 3.5–5.2)
Sodium: 141 mmol/L (ref 134–144)
Total Protein: 6.4 g/dL (ref 6.0–8.5)

## 2016-12-27 LAB — URINALYSIS, ROUTINE W REFLEX MICROSCOPIC
Bilirubin, UA: NEGATIVE
Glucose, UA: NEGATIVE
Ketones, UA: NEGATIVE
Leukocytes, UA: NEGATIVE
Nitrite, UA: NEGATIVE
Protein, UA: NEGATIVE
RBC, UA: NEGATIVE
Specific Gravity, UA: 1.006 (ref 1.005–1.030)
Urobilinogen, Ur: 0.2 mg/dL (ref 0.2–1.0)
pH, UA: 6 (ref 5.0–7.5)

## 2016-12-27 LAB — URINE CULTURE
Organism ID, Bacteria: NO GROWTH
Organism ID, Bacteria: NO GROWTH

## 2017-01-02 ENCOUNTER — Telehealth: Payer: Self-pay | Admitting: Family Medicine

## 2017-01-02 NOTE — Telephone Encounter (Signed)
She also stated that the order form for the CT was incomplete.    Her call back number is [phone].  Con Memos

## 2017-01-02 NOTE — Telephone Encounter (Signed)
Pia at [facility] called regarding a CT that was ordered on 12/26/16 for Teresa [name].  She needs information that is on the order.  She said she needs the NPI number of the facility that preformed the CT.  I gave her the number to [facility] but that not what she needed.  Thanks [employer]

## 2017-01-05 NOTE — Telephone Encounter (Signed)
I think Nihal Galloza might be able to better help with this as she filled out the form and has been working with [employer]. I don't know the NPI number of the facility. Thanks!

## 2017-01-05 NOTE — Telephone Encounter (Signed)
Can you please review and call the number below to give them more information. Thank you-aa

## 2017-01-05 NOTE — Telephone Encounter (Signed)
Please review-aa 

## 2017-01-06 ENCOUNTER — Telehealth: Payer: Self-pay | Admitting: Family Medicine

## 2017-01-06 ENCOUNTER — Ambulatory Visit (INDEPENDENT_AMBULATORY_CARE_PROVIDER_SITE_OTHER): Payer: PPO | Admitting: Family Medicine

## 2017-01-06 VITALS — BP 108/60 | HR 60 | Temp 97.8°F | Resp 16 | Wt 199.0 lb

## 2017-01-06 DIAGNOSIS — T508X5D Adverse effect of diagnostic agents, subsequent encounter: Secondary | ICD-10-CM

## 2017-01-06 DIAGNOSIS — M545 Low back pain: Secondary | ICD-10-CM | POA: Diagnosis not present

## 2017-01-06 DIAGNOSIS — R319 Hematuria, unspecified: Secondary | ICD-10-CM

## 2017-01-06 MED ORDER — HYDROCODONE-ACETAMINOPHEN 5-325 MG PO TABS: 1.0000 | ORAL_TABLET | ORAL | 0 refills | Status: DC | PRN

## 2017-01-06 MED ORDER — PREDNISONE 50 MG PO TABS: ORAL_TABLET | ORAL | 0 refills | Status: DC

## 2017-01-06 NOTE — Telephone Encounter (Signed)
Pt needs a prescription sent in to [address] for prednisone 50 MG to be taken 13,7,1 hr prior to CT to be done 01/09/17.Pt is allergic to the contrast.She has been advised to also take 50 MG of bendryl 1 hr prior to test

## 2017-01-06 NOTE — Telephone Encounter (Signed)
Advised patient as below.  

## 2017-01-06 NOTE — Telephone Encounter (Signed)
Pt is asking to be worked in [place] with Dr [name].She states she is having a lot of back pain with hematuria and just not feeling good.Pt was seen in office last week by Fabio Bering but does not really want to see her again

## 2017-01-06 NOTE — Telephone Encounter (Signed)
Please schedule

## 2017-01-06 NOTE — Progress Notes (Signed)
Teresa [name]  MRN: [id number] DOB: [birth year]  Subjective:  HPI   The patent is a 76 year old female who presents for recheck on her back pain and hematuria.   Patient began with nausea on 12/25, she developed LLQ pain on 12/27.  On 1/2 she was seen by PA and given an antibiotic after her urine revealed hematuria.  She returned on 1/4 with some improvement of pain but continued to feel bad.  CT was ordered and urine culture was obtained.  Culture came back with no growth.  Patient continued to have improvment of her LLQ pain the week of 1/8-/12 until that pain was gone.  Through this course she had back pain that had not subsided. The patient today had gross hematuria at 6 am and has continued with the hematuria and frequency stating she has urinated about 20 times since 6 am.   She continues with the back pain, no LLQ pain and still feeling bad.   It is of note that the patient has not had CT yet because it was not authorized by her insurance until today.  She does have appointment scheduled this coming Friday.  Patient Active Problem List   Diagnosis Date Noted  . Psoriasis 07/16/2015  . Hyperlipidemia 03/29/2015  . Pre-syncope 03/04/2015  . CAD (coronary artery disease)   . PVC's (premature ventricular contractions)   . Ischemic cardiomyopathy   . DDD (degenerative disc disease), lumbar 01/18/2015  . L-S radiculopathy 07/03/2014  . Breast CA ([place]) 06/28/2014  . Arthritis, degenerative 06/28/2014  . Acid reflux 03/08/2014    Past Medical History:  Diagnosis Date  . Asthma   . Breast cancer ([place]) 2009   left breast   . CAD (coronary artery disease)    CABG in 1994  . Cancer [facility])    breast cancer 2009  . Dental crowns present    caps- left back top, right back bottom  . Gastroesophageal reflux disease   . Hx of breast cancer   . Hypertension   . Ischemic cardiomyopathy    Most recent ejection fraction was 40% in 2013  . MI (myocardial infarction) 1994  . PVC's  (premature ventricular contractions)   . Rheumatoid arthritis ([facility])    feet, hands  . Vertigo    approx 2x/yr    Social History   Social History  . Marital status: Divorced    Spouse name: N/A  . Number of children: N/A  . Years of education: N/A   Occupational History  . Not on file.   Social History Main Topics  . Smoking status: Former Smoker    Packs/day: 0.50    Years: 35.00    Types: Cigarettes  . Smokeless tobacco: Former Systems developer    Quit date: 12/22/1978     Comment: Quit approx 1990  . Alcohol use No  . Drug use: No  . Sexual activity: Not on file   Other Topics Concern  . Not on file   Social History Narrative  . No narrative on file    Outpatient Encounter Prescriptions as of 01/06/2017  Medication Sig Note  . aspirin EC 81 MG tablet Take 1 tablet (81 mg total) by mouth daily.   . budesonide-formoterol (SYMBICORT) 160-4.5 MCG/ACT inhaler Inhale 2 puffs into the lungs 2 (two) times daily.   . diphenhydrAMINE (BENADRYL) 25 MG tablet Take 25 mg by mouth every 6 (six) hours as needed.   . fluocinonide cream (LIDEX) [id number] % Apply 1 application topically  2 (two) times daily as needed.   . fluticasone (FLONASE) 50 MCG/ACT nasal spray Place 1 spray into both nostrils daily.   . folic acid (FOLVITE) 1 MG tablet Take by mouth. 07/08/2016: Received from: [place]  . MAGNESIUM CITRATE PO Take by mouth.   . meclizine (ANTIVERT) 25 MG tablet Take 25 mg by mouth as needed.  07/24/2016: PRN   . meloxicam (MOBIC) 7.5 MG tablet Take 1 tablet (7.5 mg total) by mouth daily.   . metoprolol succinate (TOPROL-XL) 25 MG 24 hr tablet TAKE ONE TABLET BY MOUTH ONCE DAILY   . montelukast (SINGULAIR) 10 MG tablet Take 10 mg by mouth daily.  05/01/2015: Received from: External Pharmacy Received Sig:   . Multiple Vitamin (MULTIVITAMIN) tablet Take 1 tablet by mouth daily.   . Omega-3 Fatty Acids (FISH OIL PO) Take by mouth daily.   Marland Kitchen omeprazole (PRILOSEC) 20 MG capsule Take 1  capsule (20 mg total) by mouth daily.   . ondansetron (ZOFRAN) 4 MG tablet Take 1 tablet (4 mg total) by mouth every 8 (eight) hours as needed for nausea or vomiting.   Marland Kitchen oxymetazoline (AFRIN) 0.05 % nasal spray Place 1 spray into both nostrils 2 (two) times daily as needed for congestion. 07/24/2016: PRN  . venlafaxine (EFFEXOR) 75 MG tablet TAKE TWO TABLETS BY MOUTH ONCE DAILY   . Vitamin D, Ergocalciferol, (DRISDOL) 50000 units CAPS capsule Take 50,000 Units by mouth every 7 (seven) days.   . [DISCONTINUED] Budesonide-Formoterol Fumarate (SYMBICORT IN) Inhale into the lungs as needed.    No facility-administered encounter medications on file as of 01/06/2017.     Allergies  Allergen Reactions  . Codeine     Altered mental status  . Ivp Dye [Iodinated Diagnostic Agents] Swelling  . Sulfa Antibiotics Hives    Review of Systems  Constitutional: Positive for malaise/fatigue. Negative for fever and weight loss.  Respiratory: Positive for wheezing (chronic and unchanged). Negative for cough and shortness of breath.   Cardiovascular: Negative for chest pain, palpitations, orthopnea, claudication, leg swelling and PND.  Gastrointestinal: Positive for nausea. Negative for abdominal pain, blood in stool, constipation, diarrhea, heartburn, melena and vomiting.  Genitourinary: Positive for dysuria, frequency, hematuria and urgency. Negative for flank pain.  Musculoskeletal: Positive for back pain.  Neurological: Negative for weakness.    Objective:  BP 108/60 (BP Location: Right Arm, Patient Position: Sitting, Cuff Size: Normal)   Pulse 60   Temp 97.8 F (36.6 C) (Oral)   Resp 16   Wt 199 lb (90.3 kg)   BMI 34.16 kg/m   Physical Exam  Constitutional: She is well-developed, well-nourished, and in no distress.  HENT:  Head: Normocephalic and atraumatic.  Eyes: Conjunctivae are normal. Pupils are equal, round, and reactive to light.  Neck: Normal range of motion.  Cardiovascular: Normal  rate, regular rhythm and normal heart sounds.   Pulmonary/Chest: Effort normal and breath sounds normal.  Abdominal: She exhibits no distension and no mass. There is tenderness (LUQ tenderness greater than LLQ). There is no rebound and no guarding.  Skin: Skin is warm and dry. No rash noted. No erythema.    Assessment and Plan :   1. Hematuria, unspecified type  - Urine culture - Ambulatory referral to Urology  2. Left low back pain, unspecified chronicity, with sciatica presence unspecified  - Urine culture 3. Renal stones 4. Possible diverticulitis  HPI, Exam and A&P Transcribed under the direction and in the presence of Miguel Aschoff, Brooke Bonito.,  MD. Electronically Signed: Althea Charon, RMA I have done the exam and reviewed the chart and it is accurate to the best of my knowledge. Development worker, community has been used and  any errors in dictation or transcription are unintentional. Miguel Aschoff M.D. [facility]

## 2017-01-06 NOTE — Telephone Encounter (Signed)
Call pt and scheduled appt for today at 230 pm. Thanks TNP

## 2017-01-06 NOTE — Telephone Encounter (Signed)
Sent in prednisone for patient to take 13, 7, and 1 hours before CT scan for contrast allergy. Also take 50 mg benadryl 1 hour prior to CT scan.

## 2017-01-08 ENCOUNTER — Ambulatory Visit: Payer: PPO

## 2017-01-09 ENCOUNTER — Telehealth: Payer: Self-pay

## 2017-01-09 ENCOUNTER — Ambulatory Visit
Admission: RE | Admit: 2017-01-09 | Discharge: 2017-01-09 | Disposition: A | Discharge status: Home / Self Care | Payer: PPO | Source: Ambulatory Visit | Attending: Physician Assistant | Admitting: Physician Assistant

## 2017-01-09 DIAGNOSIS — I708 Atherosclerosis of other arteries: Secondary | ICD-10-CM | POA: Diagnosis not present

## 2017-01-09 DIAGNOSIS — R110 Nausea: Secondary | ICD-10-CM | POA: Diagnosis not present

## 2017-01-09 DIAGNOSIS — R109 Unspecified abdominal pain: Secondary | ICD-10-CM | POA: Diagnosis not present

## 2017-01-09 DIAGNOSIS — I700 Atherosclerosis of aorta: Secondary | ICD-10-CM | POA: Diagnosis not present

## 2017-01-09 DIAGNOSIS — K5730 Diverticulosis of large intestine without perforation or abscess without bleeding: Secondary | ICD-10-CM | POA: Diagnosis not present

## 2017-01-09 DIAGNOSIS — R319 Hematuria, unspecified: Secondary | ICD-10-CM | POA: Insufficient documentation

## 2017-01-09 DIAGNOSIS — M47896 Other spondylosis, lumbar region: Secondary | ICD-10-CM | POA: Diagnosis not present

## 2017-01-09 DIAGNOSIS — Z9889 Other specified postprocedural states: Secondary | ICD-10-CM | POA: Diagnosis not present

## 2017-01-09 LAB — URINE CULTURE

## 2017-01-09 MED ORDER — IOPAMIDOL (ISOVUE-300) INJECTION 61%
100.0000 mL | Freq: Once | INTRAVENOUS | Status: AC | PRN
  Administered 2017-01-09: 100 mL via INTRAVENOUS

## 2017-01-09 MED ORDER — CIPROFLOXACIN HCL 500 MG PO TABS: 500.0000 mg | ORAL_TABLET | Freq: Two times a day (BID) | ORAL | 0 refills | Status: DC

## 2017-01-09 NOTE — Telephone Encounter (Signed)
Pt advised and RX sent in-aa 

## 2017-01-09 NOTE — Telephone Encounter (Signed)
-----   Message from Jerrol Banana., MD sent at 01/09/2017 10:45 AM EST ----- c ipro 500mg  BID for 3 days

## 2017-01-09 NOTE — Telephone Encounter (Signed)
lmtcb-aa 

## 2017-01-12 ENCOUNTER — Telehealth: Payer: Self-pay

## 2017-01-12 NOTE — Telephone Encounter (Signed)
Pt advised of results below. Pt reports she is feeling better. Renaldo Fiddler, CMA

## 2017-01-12 NOTE — Telephone Encounter (Signed)
lmtcb Kathryn Cosby Drozdowski, CMA  

## 2017-01-12 NOTE — Telephone Encounter (Signed)
-----   Message from Jerrol Banana., MD sent at 01/10/2017 10:41 AM EST ----- CT unremarkable. Diverticulosis without obvious diverticulitis.

## 2017-01-26 ENCOUNTER — Ambulatory Visit: Payer: PPO | Admitting: Urology

## 2017-01-27 ENCOUNTER — Ambulatory Visit: Payer: PPO | Admitting: Cardiovascular Disease

## 2017-02-03 ENCOUNTER — Encounter: Payer: Self-pay | Admitting: Urology

## 2017-02-03 ENCOUNTER — Ambulatory Visit: Payer: PPO | Admitting: Urology

## 2017-02-03 VITALS — BP 142/83 | HR 67 | Ht 69.0 in | Wt 197.0 lb

## 2017-02-03 DIAGNOSIS — N952 Postmenopausal atrophic vaginitis: Secondary | ICD-10-CM

## 2017-02-03 DIAGNOSIS — N39498 Other specified urinary incontinence: Secondary | ICD-10-CM | POA: Diagnosis not present

## 2017-02-03 DIAGNOSIS — R310 Gross hematuria: Secondary | ICD-10-CM

## 2017-02-03 DIAGNOSIS — R3129 Other microscopic hematuria: Secondary | ICD-10-CM | POA: Diagnosis not present

## 2017-02-03 LAB — URINALYSIS, COMPLETE
Bilirubin, UA: NEGATIVE
Glucose, UA: NEGATIVE
Ketones, UA: NEGATIVE
Nitrite, UA: NEGATIVE
Protein, UA: NEGATIVE
Specific Gravity, UA: 1.015 (ref 1.005–1.030)
Urobilinogen, Ur: 0.2 mg/dL (ref 0.2–1.0)
pH, UA: 5.5 (ref 5.0–7.5)

## 2017-02-03 LAB — MICROSCOPIC EXAMINATION
Epithelial Cells (non renal): >10 /hpf — AB (ref 0–10)
WBC, UA: >30 /hpf — AB (ref 0–?)

## 2017-02-03 NOTE — Progress Notes (Signed)
02/03/2017 2:42 PM   Teresa [name] September 26, 1941 [id number]  Referring provider: Jerrol [name]., MD [address] [place], [place] [address]  Chief Complaint  Patient presents with  . New Patient (Initial Visit)    hematuria referred by Dr. Rosanna [name]    HPI: Patient is a 76 -year-old Caucasian female who presents today as a referral from their PCP, Dr. Rosanna [name], for gross hematuria.  She likes to be called Teresa [name].    Patient states that she has been having intermittent gross hematuria for the last two weeks.  She is not seeing blood in the toilet, but on her tissue paper when she wipes.  She states that she hasn't felt well since Christmas.    Patient doesn't have a prior history of hematuria.  She does have a prior history of nephrolithiasis, but she denies recurrent UTI's, trauma to the genitourinary tract or malignancies of the genitourinary tract.   She does have a family medical history of malignancies of the genitourinary tract (brother with bladder cancer).    Today, she is having symptoms of frequent urination for several years, urgency, dysuria on an intermittent basis and nocturia x 2.   Her UA today demonstrates > 30 WBC's and 3-10 RBC's.  She is not experiencing any suprapubic pain, abdominal pain or flank pain.  She denies any recent fevers, chills, nausea or vomiting.   She underwent a CT w/wo on 01/09/2017 and it noted no acute findings or clear explanation for the patient's symptoms.  No evidence of urinary tract calculus or hydronephrosis.  Extensive distal colonic diverticulosis without evidence of acute inflammation.  Postsurgical changes as described, atherosclerosis and spondylosis.  There is a 3.0 cm cyst in the lower pole of the left kidney. Delayed images result in segmental visualization of the ureters. No urothelial abnormalities are identified. The bladder is partly obscured by streak artifact from the left total hip arthroplasty, although  demonstrates no significant findings.  I have independently reviewed the films.    She is a former smoker, with a 1 ppd history.  Quit 38 years ago.  She is not exposed to secondhand smoke.  She has not worked with Sports administrator.    PMH: Past Medical History:  Diagnosis Date  . Arthritis   . Asthma   . Breast cancer ([facility]) 2009   left breast   . CAD (coronary artery disease)    CABG in 1994  . Cancer [facility])    breast cancer 2009  . Dental crowns present    caps- left back top, right back bottom  . Gastroesophageal reflux disease   . Hx of breast cancer   . Hypertension   . Ischemic cardiomyopathy    Most recent ejection fraction was 40% in 2013  . MI (myocardial infarction) 1994  . Psoriasis   . PVC's (premature ventricular contractions)   . Rheumatoid arthritis ([facility])    feet, hands  . Vertigo    approx 2x/yr    Surgical History: Past Surgical History:  Procedure Laterality Date  . ABDOMINAL HYSTERECTOMY    . back fusion    . BREAST BIOPSY Right    1991 negative  . BREAST EXCISIONAL BIOPSY Left    2009 positive  . BREAST LUMPECTOMY    . CARDIAC CATHETERIZATION     [facility]    . CORONARY ARTERY BYPASS GRAFT  1994   1 vessel - [facility]  .  FOOT ARTHRODESIS Right 07/24/2016   Procedure: FUSION FIRST METATARSAL CUNEIFORM JOINT RIGHT FOOT, FUSION SECOND METATARSAL CUNEFORM JOINT BUNION REPAIR RIGHT FOOT;  Surgeon: Albertine Patricia, DPM;  Location: [facility];  Service: Podiatry;  Laterality: Right;  . FOOT FRACTURE SURGERY    . HARDWARE REMOVAL Right 07/24/2016   Procedure: REMOVAL HARDWARE LATERAL MALLEOUS RIGHT ANKLE;  Surgeon: Albertine Patricia, DPM;  Location: [facility];  Service: Podiatry;  Laterality: Right;  REMOVAL OF PIN WHICH WAS INTACT  . HERNIA REPAIR    . TOTAL HIP ARTHROPLASTY Left   . TOTAL KNEE ARTHROPLASTY Left     Home Medications:  Allergies as of 02/03/2017      Reactions   Codeine      Altered mental status   Ivp Dye [iodinated Diagnostic Agents] Swelling   Sulfa Antibiotics Hives      Medication List       Accurate as of 02/03/17  2:42 PM. Always use your most recent med list.          aspirin EC 81 MG tablet Take 1 tablet (81 mg total) by mouth daily.   budesonide-formoterol 160-4.5 MCG/ACT inhaler Commonly known as:  SYMBICORT Inhale 2 puffs into the lungs 2 (two) times daily.   calcium carbonate 1500 (600 Ca) MG Tabs tablet Commonly known as:  OSCAL Take by mouth daily.   ciprofloxacin 500 MG tablet Commonly known as:  CIPRO Take 1 tablet (500 mg total) by mouth 2 (two) times daily.   diphenhydrAMINE 25 MG tablet Commonly known as:  BENADRYL Take 25 mg by mouth every 6 (six) hours as needed.   FISH OIL PO Take by mouth daily.   fluocinonide cream 0.05 % Commonly known as:  LIDEX Apply 1 application topically 2 (two) times daily as needed.   fluticasone 50 MCG/ACT nasal spray Commonly known as:  FLONASE Place 1 spray into both nostrils daily.   folic acid 1 MG tablet Commonly known as:  FOLVITE Take by mouth.   gabapentin 100 MG capsule Commonly known as:  NEURONTIN Take by mouth.   HYDROcodone-acetaminophen 5-325 MG tablet Commonly known as:  NORCO/VICODIN Take 1 tablet by mouth every 4 (four) hours as needed for moderate pain.   MAGNESIUM CITRATE PO Take by mouth.   meclizine 25 MG tablet Commonly known as:  ANTIVERT Take 25 mg by mouth as needed.   Melatonin 10-10 MG Tbcr Take by mouth.   meloxicam 7.5 MG tablet Commonly known as:  MOBIC Take 1 tablet (7.5 mg total) by mouth daily.   metoprolol succinate 25 MG 24 hr tablet Commonly known as:  TOPROL-XL TAKE ONE TABLET BY MOUTH ONCE DAILY   montelukast 10 MG tablet Commonly known as:  SINGULAIR Take 10 mg by mouth daily.   multivitamin tablet Take 1 tablet by mouth daily.   naproxen sodium 220 MG tablet Commonly known as:  ANAPROX Take 220 mg by mouth 2 (two)  times daily with a meal.   omeprazole 20 MG capsule Commonly known as:  PRILOSEC Take 1 capsule (20 mg total) by mouth daily.   ondansetron 4 MG tablet Commonly known as:  ZOFRAN Take 1 tablet (4 mg total) by mouth every 8 (eight) hours as needed for nausea or vomiting.   OTEZLA 30 MG Tabs Generic drug:  Apremilast Take 30 tablets by mouth 2 (two) times daily.   oxymetazoline 0.05 % nasal spray Commonly known as:  AFRIN Place 1 spray into both nostrils 2 (two) times daily  as needed for congestion.   predniSONE 50 MG tablet Commonly known as:  DELTASONE Take one tablet 13, 7, and 1 hour prior to CT scan for allergic reaction.   venlafaxine 75 MG tablet Commonly known as:  EFFEXOR TAKE TWO TABLETS BY MOUTH ONCE DAILY   Vitamin D (Ergocalciferol) 50000 units Caps capsule Commonly known as:  DRISDOL Take 50,000 Units by mouth every 7 (seven) days.       Allergies:  Allergies  Allergen Reactions  . Codeine     Altered mental status  . Ivp Dye [Iodinated Diagnostic Agents] Swelling  . Sulfa Antibiotics Hives    Family History: Family History  Problem Relation Age of Onset  . Heart attack Father   . Congestive Heart Failure Mother   . Breast cancer Mother   . Kidney failure Sister   . Prostate cancer Brother   . Bladder Cancer Brother   . Lung cancer Sister   . Arthritis Sister   . Breast cancer Sister   . Arthritis Sister   . Breast cancer Sister   . Lung cancer Sister   . Arthritis Brother     Social History:  reports that she has quit smoking. Her smoking use included Cigarettes. She has a 17.50 pack-year smoking history. She quit smokeless tobacco use about 38 years ago. She reports that she does not drink alcohol or use drugs.  ROS: UROLOGY Frequent Urination?: Yes Hard to postpone urination?: Yes Burning/pain with urination?: Yes Get up at night to urinate?: Yes Leakage of urine?: No Urine stream starts and stops?: No Trouble starting stream?:  No Do you have to strain to urinate?: No Blood in urine?: Yes Urinary tract infection?: No Sexually transmitted disease?: No Injury to kidneys or bladder?: No Painful intercourse?: No Weak stream?: No Currently pregnant?: No Vaginal bleeding?: No Last menstrual period?: 12/1991  Gastrointestinal Nausea?: No Vomiting?: No Indigestion/heartburn?: Yes Diarrhea?: No Constipation?: Yes  Constitutional Fever: No Night sweats?: Yes Weight loss?: No Fatigue?: Yes  Skin Skin rash/lesions?: Yes Itching?: Yes  Eyes Blurred vision?: No Double vision?: No  Ears/Nose/Throat Sore throat?: No Sinus problems?: Yes  Hematologic/Lymphatic Swollen glands?: No Easy bruising?: Yes  Cardiovascular Leg swelling?: No Chest pain?: No  Respiratory Cough?: Yes Shortness of breath?: Yes  Endocrine Excessive thirst?: No  Musculoskeletal Back pain?: Yes Joint pain?: Yes  Neurological Headaches?: No Dizziness?: Yes  Psychologic Depression?: No Anxiety?: No  Physical Exam: BP (!) 142/83   Pulse 67   Ht 5\' 9"  (1.753 m)   Wt 197 lb (89.4 kg)   BMI 29.09 kg/m   Constitutional: Well nourished. Alert and oriented, No acute distress. HEENT: [place] AT, moist mucus membranes. Trachea midline, no masses. Cardiovascular: No clubbing, cyanosis, or edema. Respiratory: Normal respiratory effort, no increased work of breathing. GI: Abdomen is soft, non tender, non distended, no abdominal masses. Liver and spleen not palpable.  No hernias appreciated.  Stool sample for occult testing is not indicated.   GU: No CVA tenderness.  No bladder fullness or masses.  Atrophic external genitalia, normal pubic hair distribution, no lesions. Small labial hemangiomas noted.   Normal urethral meatus, no lesions, no prolapse, no discharge.   Urethral caruncle noted.  No bladder fullness, tenderness or masses. Pale vagina mucosa, poor estrogen effect, no discharge, no lesions, good pelvic support, no  cystocele or rectocele noted.  Cervix, uterus and adnexa are surgically absent.  Anus and perineum are without rashes or lesions.    Skin: No rashes, bruises  or suspicious lesions. Lymph: No cervical or inguinal adenopathy. Neurologic: Grossly intact, no focal deficits, moving all 4 extremities. Psychiatric: Normal mood and affect.  Laboratory Data: Lab Results  Component Value Date   WBC 9.3 12/25/2016   HGB 12.7 12/21/2013   HCT 44.9 12/25/2016   MCV 93 12/25/2016   PLT 309 12/25/2016    Lab Results  Component Value Date   CREATININE 0.71 12/25/2016      Lab Results  Component Value Date   TSH 1.670 07/08/2016       Component Value Date/Time   CHOL 169 07/08/2016 1023   HDL 63 07/08/2016 1023   LDLCALC 91 07/08/2016 1023    Lab Results  Component Value Date   AST 14 12/25/2016   Lab Results  Component Value Date   ALT 11 12/25/2016    Urinalysis > 30 WBC's.  3-10 RBC's .  See EPIC.    Pertinent Imaging: CLINICAL DATA:  Hematuria with acute left flank pain and nausea. Evaluate for kidney stone versus diverticulitis. History of breast cancer.  EXAM: CT ABDOMEN AND PELVIS WITHOUT AND WITH CONTRAST  TECHNIQUE: Multidetector CT imaging of the abdomen and pelvis was performed following the standard protocol before and following the bolus administration of intravenous contrast.  CONTRAST:  116mL ISOVUE-300 IOPAMIDOL (ISOVUE-300) INJECTION 61%  COMPARISON:  CT 02/11/2013.  FINDINGS: Lower chest: Stable tiny left lower lobe nodule on image 14. The lung bases are otherwise clear. No significant pleural or pericardial effusion.  Hepatobiliary: The liver appears stable without focal abnormality or abnormal enhancement. There is stable mild extrahepatic biliary dilatation status post cholecystectomy, within physiologic limits.  Pancreas: Unremarkable. No pancreatic ductal dilatation or surrounding inflammatory changes.  Spleen: Normal in size  without focal abnormality.  Adrenals/Urinary Tract: Both adrenal glands appear normal. Pre-contrast images demonstrate no renal, ureteral or bladder calculi. Post-contrast, both kidneys enhance normally. There is no evidence of enhancing renal mass. There is a 3.0 cm cyst in the lower pole of the left kidney. Delayed images result in segmental visualization of the ureters. No urothelial abnormalities are identified. The bladder is partly obscured by streak artifact from the left total hip arthroplasty, although demonstrates no significant findings.  Stomach/Bowel: No evidence of bowel wall thickening, distention or surrounding inflammatory change. There is extensive sigmoid diverticulosis without evidence acute inflammation. There is lesser diverticulosis of the descending colon. The appendix is surgically absent.  Vascular/Lymphatic: There are no enlarged abdominal or pelvic lymph nodes. Aortic and branch vessel atherosclerosis. No acute vascular findings.  Reproductive: Hysterectomy.  No evidence of adnexal mass.  Other: Small umbilical hernia containing only fat.  No ascites.  Musculoskeletal: No acute or significant osseous findings. There is multilevel spondylosis status post lumbar fusion. There are degenerative changes of the sacroiliac joints. Previous left hip arthroplasty noted.  IMPRESSION: 1. No acute findings or clear explanation for the patient's symptoms. 2. No evidence of urinary tract calculus or hydronephrosis. 3. Extensive distal colonic diverticulosis without evidence of acute inflammation. 4. Postsurgical changes as described, atherosclerosis and spondylosis.   Electronically Signed   By: Richardean Sale M.D.   On: 01/09/2017 14:08  Assessment & Plan:    1. Gross hematuria  - I explained to the patient that there are a number of causes that can be associated with blood in the urine, such as stones, UTI's, damage to the urinary tract and/or  cancer.  - Patient has completed a CT scan that did not identify any worrisome lesion, but her  ureters were not completely opacified   - We discussed the possibility of the lack the detail of excluding some urologic tumors - it is recommended to undergo cystoscopy with bilateral retrogrades in the OR to complete the hematuria workup, but with her co morbidities and advanced age, this may not be prudent at this time   - I did recommended a cystoscopy. I described how this is performed, typically in an office setting with a flexible cystoscope. We described the risks, benefits, and possible side effects, the most common of which is a minor amount of blood in the urine and/or burning which usually resolves in 24 to 48 hours.    - The patient had the opportunity to ask questions which were answered. Based upon this discussion, the patient is willing to proceed. Therefore, I've ordered: a cystoscopy.  - UA  - Urine culture   2. Vaginal atrophy  - not a candidate for vaginal estrogen cream due to breast cancer  - advised to use olive/coconut oil for discomfort  3. Incontinence  - spoke about this briefly during visit  - will give samples of Myrbetriq 25 mg, # 28 given  - will evaluate further once the cystoscopy has been completed   Return for cystoscopy.  These notes generated with voice recognition software. I apologize for typographical errors.  Zara Council, [facility] [address], [place], [place] [address] [phone]

## 2017-02-04 LAB — BUN+CREAT
BUN/Creatinine Ratio: 22 (ref 12–28)
BUN: 15 mg/dL (ref 8–27)
Creatinine, Ser: 0.68 mg/dL (ref 0.57–1.00)
GFR calc Af Amer: 99 mL/min/{1.73_m2} (ref >59)
GFR calc non Af Amer: 86 mL/min/{1.73_m2} (ref >59)

## 2017-02-05 ENCOUNTER — Ambulatory Visit
Admission: RE | Admit: 2017-02-05 | Discharge: 2017-02-05 | Disposition: A | Discharge status: Home / Self Care | Payer: PPO | Source: Ambulatory Visit | Attending: Neurological Surgery | Admitting: Neurological Surgery

## 2017-02-05 ENCOUNTER — Other Ambulatory Visit: Payer: Self-pay | Admitting: Neurological Surgery

## 2017-02-05 DIAGNOSIS — M4326 Fusion of spine, lumbar region: Secondary | ICD-10-CM | POA: Diagnosis not present

## 2017-02-05 DIAGNOSIS — M47896 Other spondylosis, lumbar region: Secondary | ICD-10-CM | POA: Insufficient documentation

## 2017-02-05 DIAGNOSIS — M549 Dorsalgia, unspecified: Secondary | ICD-10-CM

## 2017-02-05 DIAGNOSIS — M4185 Other forms of scoliosis, thoracolumbar region: Secondary | ICD-10-CM | POA: Diagnosis not present

## 2017-02-05 DIAGNOSIS — I708 Atherosclerosis of other arteries: Secondary | ICD-10-CM | POA: Diagnosis not present

## 2017-02-05 DIAGNOSIS — M419 Scoliosis, unspecified: Secondary | ICD-10-CM | POA: Diagnosis not present

## 2017-02-05 DIAGNOSIS — I700 Atherosclerosis of aorta: Secondary | ICD-10-CM | POA: Insufficient documentation

## 2017-02-06 ENCOUNTER — Other Ambulatory Visit: Payer: Self-pay | Admitting: Neurological Surgery

## 2017-02-06 DIAGNOSIS — M419 Scoliosis, unspecified: Secondary | ICD-10-CM

## 2017-02-06 LAB — CULTURE, URINE COMPREHENSIVE

## 2017-02-09 ENCOUNTER — Telehealth: Payer: Self-pay

## 2017-02-09 DIAGNOSIS — N390 Urinary tract infection, site not specified: Secondary | ICD-10-CM

## 2017-02-09 MED ORDER — AMOXICILLIN-POT CLAVULANATE 875-125 MG PO TABS: 1.0000 | ORAL_TABLET | Freq: Two times a day (BID) | ORAL | 0 refills | Status: AC

## 2017-02-09 NOTE — Telephone Encounter (Signed)
Spoke with pt in reference to +ucx. Made aware abx were sent to pharmacy and to take probiotic with abx. Pt voiced understanding.

## 2017-02-09 NOTE — Telephone Encounter (Signed)
-----   Message from Nori Riis, PA-C sent at 02/06/2017 11:52 AM EST ----- Patient has a +UCx.  They need to start Augmentin 875/125,  one tablet twice daily for seven days.  They also need to take a probiotic with the antibiotic course.  The dosage is listed below:  L. acidophilus and L. casei (25 x 109 CFU/day for 2 days, then 50 x 109 CFU/day for duration of the antibiotic course)

## 2017-02-16 ENCOUNTER — Telehealth: Payer: Self-pay

## 2017-02-16 NOTE — Telephone Encounter (Signed)
Pt called stating she has 2 abx pills left but feels like she has a vaginal yeast infection. Pt requested a medication. Reinforced with pt if she feels that she has yeast infection then she should get OTC medications. Pt voiced understanding.

## 2017-02-17 DIAGNOSIS — M25571 Pain in right ankle and joints of right foot: Secondary | ICD-10-CM | POA: Diagnosis not present

## 2017-02-19 ENCOUNTER — Ambulatory Visit
Admission: RE | Admit: 2017-02-19 | Discharge: 2017-02-19 | Disposition: A | Discharge status: Home / Self Care | Payer: PPO | Source: Ambulatory Visit | Attending: Neurological Surgery | Admitting: Neurological Surgery

## 2017-02-19 DIAGNOSIS — M48061 Spinal stenosis, lumbar region without neurogenic claudication: Secondary | ICD-10-CM | POA: Insufficient documentation

## 2017-02-19 DIAGNOSIS — M4316 Spondylolisthesis, lumbar region: Secondary | ICD-10-CM | POA: Diagnosis not present

## 2017-02-19 DIAGNOSIS — M4802 Spinal stenosis, cervical region: Secondary | ICD-10-CM | POA: Diagnosis not present

## 2017-02-19 DIAGNOSIS — M5137 Other intervertebral disc degeneration, lumbosacral region: Secondary | ICD-10-CM | POA: Insufficient documentation

## 2017-02-19 DIAGNOSIS — M5135 Other intervertebral disc degeneration, thoracolumbar region: Secondary | ICD-10-CM | POA: Insufficient documentation

## 2017-02-19 DIAGNOSIS — M542 Cervicalgia: Secondary | ICD-10-CM | POA: Diagnosis not present

## 2017-02-19 DIAGNOSIS — M4602 Spinal enthesopathy, cervical region: Secondary | ICD-10-CM | POA: Insufficient documentation

## 2017-02-19 DIAGNOSIS — M1288 Other specific arthropathies, not elsewhere classified, other specified site: Secondary | ICD-10-CM | POA: Insufficient documentation

## 2017-02-19 DIAGNOSIS — M2578 Osteophyte, vertebrae: Secondary | ICD-10-CM | POA: Insufficient documentation

## 2017-02-19 DIAGNOSIS — M419 Scoliosis, unspecified: Secondary | ICD-10-CM

## 2017-02-24 ENCOUNTER — Encounter: Payer: Self-pay | Admitting: Urology

## 2017-02-24 ENCOUNTER — Ambulatory Visit: Payer: PPO | Admitting: Urology

## 2017-02-24 VITALS — BP 147/73 | HR 61 | Ht 69.0 in | Wt 192.8 lb

## 2017-02-24 DIAGNOSIS — R310 Gross hematuria: Secondary | ICD-10-CM | POA: Diagnosis not present

## 2017-02-24 LAB — URINALYSIS, COMPLETE
Bilirubin, UA: NEGATIVE
Glucose, UA: NEGATIVE
Ketones, UA: NEGATIVE
Nitrite, UA: NEGATIVE
Protein, UA: NEGATIVE
RBC, UA: NEGATIVE
Specific Gravity, UA: 1.02 (ref 1.005–1.030)
Urobilinogen, Ur: 0.2 mg/dL (ref 0.2–1.0)
pH, UA: 5 (ref 5.0–7.5)

## 2017-02-24 LAB — MICROSCOPIC EXAMINATION

## 2017-02-24 MED ORDER — CIPROFLOXACIN HCL 500 MG PO TABS
500.0000 mg | ORAL_TABLET | Freq: Once | ORAL | Status: AC
Start: 2017-02-24 — End: 2017-02-24
  Administered 2017-02-24: 500 mg via ORAL

## 2017-02-24 MED ORDER — LIDOCAINE HCL 2 % EX GEL
1.0000 "application " | Freq: Once | CUTANEOUS | Status: AC
  Administered 2017-02-24: 1 via URETHRAL

## 2017-02-24 NOTE — Progress Notes (Signed)
   02/24/17  CC:  Chief Complaint  Patient presents with  . Cysto    HPI: Patient follows up today for cystoscopy. She had 2 weeks of gross hematuria Jan 2018. She is a former smoker. CT scan was done 01/09/2017 and was benign. I reviewed the images. She had extensive diverticulosis. Bladder somewhat obscured by left hip replacement.   Chaperone: [place] - cysto and exam  Blood pressure (!) 147/73, pulse 61, height 5\' 9"  (1.753 m), weight 87.5 kg (192 lb 12.8 oz). NED. A&Ox3.   No respiratory distress   Abd soft, NT, ND, no mass Normal external genitalia with patent urethral meatus Significant atrophy and meatal retraction. Stenotic introitus. Bladder and urethra palpably normal.   Cystoscopy Procedure Note  Patient identification was confirmed, informed consent was obtained, and patient was prepped using Betadine solution.  Lidocaine jelly was administered per urethral meatus.    Preoperative abx where received prior to procedure.    Procedure: - Flexible cystoscope introduced, without any difficulty.   - Thorough search of the bladder revealed:    normal urethral meatus    normal urothelium    no stones    no ulcers     no tumors    no urethral polyps    no trabeculation  - Ureteral orifices were normal in position and appearance.  Post-Procedure: - Patient tolerated the procedure well  Assessment/ Plan:  Gross hematuria - benign evaluation -- see in 1 year for H&P, UA or sooner if issues.     Festus Aloe, MD

## 2017-02-25 DIAGNOSIS — G8929 Other chronic pain: Secondary | ICD-10-CM | POA: Diagnosis not present

## 2017-02-25 DIAGNOSIS — M419 Scoliosis, unspecified: Secondary | ICD-10-CM | POA: Diagnosis not present

## 2017-02-25 DIAGNOSIS — M542 Cervicalgia: Secondary | ICD-10-CM | POA: Diagnosis not present

## 2017-02-25 DIAGNOSIS — M545 Low back pain: Secondary | ICD-10-CM | POA: Diagnosis not present

## 2017-02-25 DIAGNOSIS — M25552 Pain in left hip: Secondary | ICD-10-CM | POA: Diagnosis not present

## 2017-03-03 ENCOUNTER — Encounter: Payer: Self-pay | Admitting: *Deleted

## 2017-03-09 DIAGNOSIS — G894 Chronic pain syndrome: Secondary | ICD-10-CM | POA: Diagnosis not present

## 2017-03-09 DIAGNOSIS — L409 Psoriasis, unspecified: Secondary | ICD-10-CM | POA: Diagnosis not present

## 2017-03-09 DIAGNOSIS — M5417 Radiculopathy, lumbosacral region: Secondary | ICD-10-CM | POA: Diagnosis not present

## 2017-03-09 DIAGNOSIS — M150 Primary generalized (osteo)arthritis: Secondary | ICD-10-CM | POA: Diagnosis not present

## 2017-03-12 DIAGNOSIS — M419 Scoliosis, unspecified: Secondary | ICD-10-CM | POA: Diagnosis not present

## 2017-03-12 DIAGNOSIS — M6281 Muscle weakness (generalized): Secondary | ICD-10-CM | POA: Diagnosis not present

## 2017-03-16 ENCOUNTER — Telehealth: Payer: Self-pay | Admitting: Family Medicine

## 2017-03-16 NOTE — Telephone Encounter (Signed)
Pt states she is having a dental procedure done on Wednesday morning and is requesting an antibiotic due to hip replacement.  [facility].  YF#[phone]/FM

## 2017-03-16 NOTE — Telephone Encounter (Signed)
Please review-aa 

## 2017-03-17 ENCOUNTER — Other Ambulatory Visit: Payer: Self-pay

## 2017-03-17 MED ORDER — AMOXICILLIN 500 MG PO CAPS: 500.0000 mg | ORAL_CAPSULE | ORAL | 0 refills | Status: DC

## 2017-03-17 NOTE — Telephone Encounter (Signed)
Done  ED 

## 2017-03-17 NOTE — Telephone Encounter (Signed)
Amoxil 500mg --4 tabs  1 hr prior. #4

## 2017-03-18 DIAGNOSIS — M419 Scoliosis, unspecified: Secondary | ICD-10-CM | POA: Diagnosis not present

## 2017-03-18 DIAGNOSIS — M6281 Muscle weakness (generalized): Secondary | ICD-10-CM | POA: Diagnosis not present

## 2017-03-19 DIAGNOSIS — M461 Sacroiliitis, not elsewhere classified: Secondary | ICD-10-CM | POA: Diagnosis not present

## 2017-03-27 ENCOUNTER — Other Ambulatory Visit: Payer: Self-pay | Admitting: Family Medicine

## 2017-03-27 MED ORDER — AMOXICILLIN 500 MG PO CAPS: 500.0000 mg | ORAL_CAPSULE | ORAL | 0 refills | Status: DC

## 2017-03-27 NOTE — Telephone Encounter (Signed)
Pt contacted office for refill request on the following medications:  amoxicillin (AMOXIL) 500 MG capsule.  [facility].  UJ#[phone]/FA  This is a Dr [name] pt.    Pt states she has had a hip replacement and her dentist request she take an antibiotic before any procedure.  Pt has a dentist appointment on Monday and will need this called into the pharmacy today if possible/MW

## 2017-04-05 ENCOUNTER — Other Ambulatory Visit: Payer: Self-pay | Admitting: Family Medicine

## 2017-04-07 ENCOUNTER — Other Ambulatory Visit: Payer: Self-pay | Admitting: Emergency Medicine

## 2017-04-09 ENCOUNTER — Other Ambulatory Visit: Payer: Self-pay

## 2017-04-09 DIAGNOSIS — M542 Cervicalgia: Secondary | ICD-10-CM | POA: Diagnosis not present

## 2017-04-09 DIAGNOSIS — M533 Sacrococcygeal disorders, not elsewhere classified: Secondary | ICD-10-CM | POA: Diagnosis not present

## 2017-04-09 MED ORDER — AMOXICILLIN 500 MG PO CAPS: 500.0000 mg | ORAL_CAPSULE | ORAL | 4 refills | Status: DC

## 2017-04-09 NOTE — Telephone Encounter (Signed)
Pt has some dental work coming up.  She would like Amoxicillin (with refills for multiple dental procedures coming up)  sent to [facility] in [place]  Thanks,   -Mickel Baas

## 2017-04-13 ENCOUNTER — Ambulatory Visit: Payer: PPO | Admitting: Cardiovascular Disease

## 2017-04-14 ENCOUNTER — Other Ambulatory Visit: Payer: Self-pay | Admitting: Family Medicine

## 2017-04-14 DIAGNOSIS — Z1231 Encounter for screening mammogram for malignant neoplasm of breast: Secondary | ICD-10-CM

## 2017-04-22 DIAGNOSIS — M7751 Other enthesopathy of right foot: Secondary | ICD-10-CM | POA: Diagnosis not present

## 2017-04-27 DIAGNOSIS — M542 Cervicalgia: Secondary | ICD-10-CM | POA: Diagnosis not present

## 2017-04-27 DIAGNOSIS — M4316 Spondylolisthesis, lumbar region: Secondary | ICD-10-CM | POA: Diagnosis not present

## 2017-05-06 ENCOUNTER — Ambulatory Visit: Payer: PPO

## 2017-05-09 ENCOUNTER — Encounter: Payer: Self-pay | Admitting: Family Medicine

## 2017-05-09 ENCOUNTER — Ambulatory Visit (INDEPENDENT_AMBULATORY_CARE_PROVIDER_SITE_OTHER): Payer: PPO | Admitting: Family Medicine

## 2017-05-09 VITALS — BP 126/54 | HR 65 | Temp 97.9°F | Resp 16 | Wt 189.6 lb

## 2017-05-09 DIAGNOSIS — J069 Acute upper respiratory infection, unspecified: Secondary | ICD-10-CM | POA: Diagnosis not present

## 2017-05-09 DIAGNOSIS — J45909 Unspecified asthma, uncomplicated: Secondary | ICD-10-CM | POA: Insufficient documentation

## 2017-05-09 MED ORDER — HYDROCODONE-HOMATROPINE 5-1.5 MG/5ML PO SYRP: ORAL_SOLUTION | ORAL | 0 refills | Status: DC

## 2017-05-09 NOTE — Patient Instructions (Signed)
Continue with your inhaler twice daily. Continue Mucinex and cough syrup as needed. If cough/sputum not clearing over the next few days- call us.

## 2017-05-09 NOTE — Progress Notes (Signed)
Subjective:     Patient ID: Teresa [name], female   DOB: 05-30-1941, 76 y.o.   MRN: [id number]  HPI  Chief Complaint  Patient presents with  . Cough    Patient is present in office today with complaints of cough and chest congestion since 05/03/17 patient described cough initially as dry and hacking. Patient reports on 5/15 cough became productive with green phlegm, patient states she has had pain in back anmd sides do to cough. Associated with cough patient complains of sinus pain and pressure, itchy ears and shortness of breath. Patient has been taking otc Singulair for relief.   States no fever or chills with clear nasal drainage and has resumed using Symbicort. Came in today primarily as the cough is waking her up at night.   Review of Systems     Objective:   Physical Exam  Constitutional: She appears well-developed and well-nourished. No distress.  Ears: T.M's intact without inflammation Sinuses: non-tender Throat: no tonsillar enlargement or exudate Neck: no cervical adenopathy Lungs: clear     Assessment:    1. Viral upper respiratory tract infection - HYDROcodone-homatropine (HYCODAN) 5-1.5 MG/5ML syrup; 5 ml 4-6 hours as needed for cough  Dispense: 240 mL; Refill: 0    Plan:    Continue Mucinex and Symbicort. Call if sputum/cough not improving over the next few days.

## 2017-05-19 ENCOUNTER — Ambulatory Visit (INDEPENDENT_AMBULATORY_CARE_PROVIDER_SITE_OTHER): Payer: PPO | Admitting: Cardiovascular Disease

## 2017-05-19 ENCOUNTER — Encounter: Payer: Self-pay | Admitting: Cardiovascular Disease

## 2017-05-19 VITALS — BP 148/66 | HR 60 | Ht 64.0 in | Wt 187.0 lb

## 2017-05-19 DIAGNOSIS — I493 Ventricular premature depolarization: Secondary | ICD-10-CM | POA: Diagnosis not present

## 2017-05-19 DIAGNOSIS — E785 Hyperlipidemia, unspecified: Secondary | ICD-10-CM | POA: Diagnosis not present

## 2017-05-19 DIAGNOSIS — N643 Galactorrhea not associated with childbirth: Secondary | ICD-10-CM | POA: Diagnosis not present

## 2017-05-19 DIAGNOSIS — I2510 Atherosclerotic heart disease of native coronary artery without angina pectoris: Secondary | ICD-10-CM | POA: Diagnosis not present

## 2017-05-19 MED ORDER — ROSUVASTATIN CALCIUM 5 MG PO TABS: 5.0000 mg | ORAL_TABLET | Freq: Every day | ORAL | 11 refills | Status: DC

## 2017-05-19 NOTE — Progress Notes (Signed)
Cardiology Office Note   Date:  05/19/2017   ID:  Teresa Teresa [name], DOB [birth year], MRN [id number]  PCP:  Jerrol Banana., MD  Cardiologist:   Kathlyn Sacramento, MD   Chief Complaint  Teresa [name] presents with  . other    6 month follow up. Teresa [name] c/o SOB but states she has been sick. Meds reviewed verbally with Teresa [name].       History of Present Illness: Teresa Teresa [name] is a 76 y.o. female who presents for a follow-up visit regarding coronary artery disease. She is status post CABG in 1994 with no ischemic cardiac events since then. She is known to have mild ischemic cardiomyopathy .  She was diagnosed with PVCs in 2015 and was started on small dose Toprol.She does have underlying left bundle branch block. She has no history of diabetes, hypertension or hyperlipidemia. She is not a smoker. There is family history of coronary artery disease. She had cardiac workup in March 2016 for dizziness and presyncope. Holter monitor  showed normal sinus rhythm with short runs of SVT and nonsustained ventricular tachycardia. Echocardiogram showed normal LV systolic function with an ejection fraction of 55-60%, mild mitral regurgitation, moderately dilated left atrium and mild pulmonary hypertension. Pharmacologic nuclear stress test in April 2016 showed no evidence of ischemia with ejection fraction of 48%.  She has been doing reasonably well with no recent chest pain or worsening dyspnea. She has been taking her medications regularly. Blood pressure is mildly elevated today.    Past Medical History:  Diagnosis Date  . Arthritis   . Asthma   . Breast cancer ([place]) 2009   left breast   . CAD (coronary artery disease)    CABG in 1994  . Cancer [facility])    breast cancer 2009  . Dental crowns present    caps- left back top, right back bottom  . Gastroesophageal reflux disease   . Hx of breast cancer   . Hypertension   . Ischemic cardiomyopathy    Most recent ejection fraction was 40% in  2013  . MI (myocardial infarction) ([place]) 1994  . Psoriasis   . PVC's (premature ventricular contractions)   . Rheumatoid arthritis ([facility])    feet, hands  . Vertigo    approx 2x/yr    Past Surgical History:  Procedure Laterality Date  . ABDOMINAL HYSTERECTOMY    . back fusion    . BREAST BIOPSY Right    1991 negative  . BREAST EXCISIONAL BIOPSY Left    2009 positive  . BREAST LUMPECTOMY    . CARDIAC CATHETERIZATION     [place]    . CORONARY ARTERY BYPASS GRAFT  1994   1 vessel - [facility]  . FOOT ARTHRODESIS Right 07/24/2016   Procedure: FUSION FIRST METATARSAL CUNEIFORM JOINT RIGHT FOOT, FUSION SECOND METATARSAL CUNEFORM JOINT BUNION REPAIR RIGHT FOOT;  Surgeon: Albertine Patricia, DPM;  Location: [facility];  Service: Podiatry;  Laterality: Right;  . FOOT FRACTURE SURGERY    . HARDWARE REMOVAL Right 07/24/2016   Procedure: REMOVAL HARDWARE LATERAL MALLEOUS RIGHT ANKLE;  Surgeon: Albertine Patricia, DPM;  Location: [facility];  Service: Podiatry;  Laterality: Right;  REMOVAL OF PIN WHICH WAS INTACT  . HERNIA REPAIR    . TOTAL HIP ARTHROPLASTY Left   . TOTAL KNEE ARTHROPLASTY Left      Current Outpatient Prescriptions  Medication Sig Dispense Refill  . amoxicillin (AMOXIL)  500 MG capsule Take 1 capsule (500 mg total) by mouth See admin instructions. 4 PO 1 hour prior to procedure 4 capsule 4  . Apremilast (OTEZLA) 30 MG TABS Take 30 mg by mouth 2 (two) times daily.    Marland Kitchen aspirin EC 81 MG tablet Take 1 tablet (81 mg total) by mouth daily. 90 tablet 3  . budesonide-formoterol (SYMBICORT) 160-4.5 MCG/ACT inhaler Inhale 2 puffs into Teresa lungs 2 (two) times daily. 1 Inhaler 3  . calcium carbonate (OSCAL) 1500 (600 Ca) MG TABS tablet Take by mouth daily.    . diphenhydrAMINE (BENADRYL) 25 MG tablet Take 25 mg by mouth every 6 (six) hours as needed.    . fluticasone (FLONASE) 50 MCG/ACT nasal spray Place 1 spray into both nostrils  daily.    Marland Kitchen gabapentin (NEURONTIN) 100 MG capsule Take 100 mg by mouth 3 (three) times daily.    . meclizine (ANTIVERT) 25 MG tablet Take 25 mg by mouth as needed.   1  . Melatonin 10-10 MG TBCR Take by mouth.    . meloxicam (MOBIC) 7.5 MG tablet Take 1 tablet (7.5 mg total) by mouth daily. 30 tablet 3  . metoprolol succinate (TOPROL-XL) 25 MG 24 hr tablet TAKE ONE TABLET BY MOUTH ONCE DAILY 90 tablet 3  . montelukast (SINGULAIR) 10 MG tablet Take 10 mg by mouth daily.   12  . omeprazole (PRILOSEC) 20 MG capsule Take 1 capsule (20 mg total) by mouth daily. 30 capsule 3  . venlafaxine (EFFEXOR) 75 MG tablet TAKE TWO TABLETS BY MOUTH ONCE DAILY 60 tablet 11  . Vitamin D, Ergocalciferol, (DRISDOL) 50000 units CAPS capsule Take 50,000 Units by mouth every 7 (seven) days.     No current facility-administered medications for this visit.     Allergies:   Codeine; Ivp dye [iodinated diagnostic agents]; and Sulfa antibiotics    Social History:  Teresa Teresa [name]  reports that she has quit smoking. Her smoking use included Cigarettes. She has a 17.50 pack-year smoking history. She quit smokeless tobacco use about 38 years ago. She reports that she does not drink alcohol or use drugs.   Family History:  Teresa Teresa [name]'s family history includes Arthritis in her brother, sister, and sister; Bladder Cancer in her brother; Breast cancer in her mother, sister, and sister; Congestive Heart Failure in her mother; Heart attack in her father; Kidney failure in her sister; Lung cancer in her sister and sister; Prostate cancer in her brother.    ROS:  Please see Teresa history of present illness.   Otherwise, review of systems are positive for none.   All other systems are reviewed and negative.    PHYSICAL EXAM: VS:  BP (!) 148/66 (BP Location: Left Arm, Teresa [name] Position: Sitting, Cuff Size: Normal)   Pulse 60   Ht 5\' 4"  (1.626 m)   Wt 187 lb (84.8 kg)   BMI 32.10 kg/m  , BMI Body mass index is 32.1 kg/m. GEN: Well  nourished, well developed, in no acute distress  HEENT: normal  Neck: no JVD, carotid bruits, or masses Cardiac: RRR; no  rubs, or gallops,no edema . There is 1/ 6 systolic ejection murmur in Teresa aortic area  Respiratory:  clear to auscultation bilaterally, normal work of breathing GI: soft, nontender, nondistended, + BS MS: no deformity or atrophy  Skin: warm and dry, no rash Neuro:  Strength and sensation are intact Psych: euthymic mood, full affect   EKG:  EKG is ordered today. Teresa ekg ordered  today demonstrates normal sinus rhythm with left bundle branch block.  Recent Labs: 07/08/2016: TSH 1.670 12/25/2016: ALT 11; Platelets 309; Potassium 4.3; Sodium 141 02/03/2017: BUN 15; Creatinine, Ser 0.68    Lipid Panel    Component Value Date/Time   CHOL 169 07/08/2016 1023   TRIG 73 07/08/2016 1023   HDL 63 07/08/2016 1023   LDLCALC 91 07/08/2016 1023      Wt Readings from Last 3 Encounters:  05/19/17 187 lb (84.8 kg)  05/09/17 189 lb 9.6 oz (86 kg)  02/24/17 192 lb 12.8 oz (87.5 kg)        ASSESSMENT AND PLAN:  1.  Coronary artery disease involving native coronary arteries without angina: She is overall doing very well with no anginal symptoms. Continue medical therapy.  2. Mild ischemic cardiomyopathy: Most recent ejection fraction was normal by echo but 48% by nuclear stress test.  Continue small dose Toprol.  3. Essential hypertension: Blood pressure is mildly elevated. Continue to monitor. We can consider adding a small dose ACE inhibitor or ARB if needed.  4. Hyperlipidemia: Most recent LDL was 91. Given her known history of coronary artery disease, she benefits from being on a statin targeting an LDL of less than 70. She has been hesitant in Teresa past due to fear of myalgia. I elected to start small dose rosuvastatin 5 mg daily. Check lipid and liver profile in 6 weeks.     Disposition:   FU with me in 12 months  Signed,  Kathlyn Sacramento, MD  05/19/2017 11:32 AM     [place]

## 2017-05-19 NOTE — Patient Instructions (Addendum)
Medication Instructions:  Your physician has recommended you make the following change in your medication:  START taking rosuvastatin 5mg  once daily   Labwork: Fasting lipid and liver profile in 6 weeks at the [facility] outpatient lab. Nothing to eat or drink after midnight the evening before your labs.   Testing/Procedures: none  Follow-Up: Your physician wants you to follow-up in: one year with Dr. Fletcher Anon.  You will receive a reminder letter in the mail two months in advance. If you don't receive a letter, please call our office to schedule the follow-up appointment.   Any Other Special Instructions Will Be Listed Below (If Applicable).     If you need a refill on your cardiac medications before your next appointment, please call your pharmacy.

## 2017-05-26 DIAGNOSIS — M4316 Spondylolisthesis, lumbar region: Secondary | ICD-10-CM | POA: Diagnosis not present

## 2017-05-26 DIAGNOSIS — M47812 Spondylosis without myelopathy or radiculopathy, cervical region: Secondary | ICD-10-CM | POA: Diagnosis not present

## 2017-05-26 DIAGNOSIS — M47816 Spondylosis without myelopathy or radiculopathy, lumbar region: Secondary | ICD-10-CM | POA: Diagnosis not present

## 2017-05-26 DIAGNOSIS — M542 Cervicalgia: Secondary | ICD-10-CM | POA: Diagnosis not present

## 2017-06-03 DIAGNOSIS — M7751 Other enthesopathy of right foot: Secondary | ICD-10-CM | POA: Diagnosis not present

## 2017-06-08 DIAGNOSIS — M5002 Cervical disc disorder with myelopathy, mid-cervical region, unspecified level: Secondary | ICD-10-CM | POA: Diagnosis not present

## 2017-06-11 ENCOUNTER — Ambulatory Visit
Admission: RE | Admit: 2017-06-11 | Discharge: 2017-06-11 | Disposition: A | Discharge status: Home / Self Care | Payer: PPO | Source: Ambulatory Visit | Attending: Family Medicine | Admitting: Family Medicine

## 2017-06-11 DIAGNOSIS — Z1231 Encounter for screening mammogram for malignant neoplasm of breast: Secondary | ICD-10-CM | POA: Insufficient documentation

## 2017-06-11 HISTORY — DX: Personal history of irradiation: Z92.3

## 2017-06-23 ENCOUNTER — Other Ambulatory Visit
Admission: RE | Admit: 2017-06-23 | Discharge: 2017-06-23 | Disposition: A | Discharge status: Home / Self Care | Payer: PPO | Source: Ambulatory Visit | Attending: Cardiovascular Disease | Admitting: Cardiovascular Disease

## 2017-06-23 DIAGNOSIS — N643 Galactorrhea not associated with childbirth: Secondary | ICD-10-CM | POA: Insufficient documentation

## 2017-06-23 LAB — LIPID PANEL
Cholesterol: 157 mg/dL (ref 0–200)
HDL: 62 mg/dL (ref >40)
LDL Cholesterol: 80 mg/dL (ref 0–99)
Total CHOL/HDL Ratio: 2.5 RATIO
Triglycerides: 77 mg/dL (ref <150)
VLDL: 15 mg/dL (ref 0–40)

## 2017-06-23 LAB — HEPATIC FUNCTION PANEL
ALT: 18 U/L (ref 14–54)
AST: 24 U/L (ref 15–41)
Albumin: 4.1 g/dL (ref 3.5–5.0)
Alkaline Phosphatase: 74 U/L (ref 38–126)
Bilirubin, Direct: 0.1 mg/dL (ref 0.1–0.5)
Indirect Bilirubin: 0.5 mg/dL (ref 0.3–0.9)
Total Bilirubin: 0.6 mg/dL (ref 0.3–1.2)
Total Protein: 7.1 g/dL (ref 6.5–8.1)

## 2017-07-07 DIAGNOSIS — M25871 Other specified joint disorders, right ankle and foot: Secondary | ICD-10-CM | POA: Diagnosis not present

## 2017-07-07 DIAGNOSIS — M79671 Pain in right foot: Secondary | ICD-10-CM | POA: Diagnosis not present

## 2017-07-07 DIAGNOSIS — M7751 Other enthesopathy of right foot: Secondary | ICD-10-CM | POA: Diagnosis not present

## 2017-07-30 DIAGNOSIS — M79671 Pain in right foot: Secondary | ICD-10-CM | POA: Diagnosis not present

## 2017-07-30 DIAGNOSIS — R262 Difficulty in walking, not elsewhere classified: Secondary | ICD-10-CM | POA: Diagnosis not present

## 2017-07-30 DIAGNOSIS — M6281 Muscle weakness (generalized): Secondary | ICD-10-CM | POA: Diagnosis not present

## 2017-07-30 DIAGNOSIS — M25571 Pain in right ankle and joints of right foot: Secondary | ICD-10-CM | POA: Diagnosis not present

## 2017-08-07 ENCOUNTER — Encounter: Payer: Self-pay | Admitting: Family Medicine

## 2017-08-10 DIAGNOSIS — M79671 Pain in right foot: Secondary | ICD-10-CM | POA: Diagnosis not present

## 2017-08-10 DIAGNOSIS — B351 Tinea unguium: Secondary | ICD-10-CM | POA: Diagnosis not present

## 2017-08-10 DIAGNOSIS — M2580 Other specified joint disorders, unspecified joint: Secondary | ICD-10-CM | POA: Diagnosis not present

## 2017-08-12 DIAGNOSIS — M150 Primary generalized (osteo)arthritis: Secondary | ICD-10-CM | POA: Diagnosis not present

## 2017-08-12 DIAGNOSIS — M79671 Pain in right foot: Secondary | ICD-10-CM | POA: Diagnosis not present

## 2017-08-12 DIAGNOSIS — M5136 Other intervertebral disc degeneration, lumbar region: Secondary | ICD-10-CM | POA: Diagnosis not present

## 2017-08-12 DIAGNOSIS — R768 Other specified abnormal immunological findings in serum: Secondary | ICD-10-CM | POA: Diagnosis not present

## 2017-08-12 DIAGNOSIS — L409 Psoriasis, unspecified: Secondary | ICD-10-CM | POA: Diagnosis not present

## 2017-08-12 DIAGNOSIS — M5417 Radiculopathy, lumbosacral region: Secondary | ICD-10-CM | POA: Diagnosis not present

## 2017-08-12 DIAGNOSIS — M180 Bilateral primary osteoarthritis of first carpometacarpal joints: Secondary | ICD-10-CM | POA: Diagnosis not present

## 2017-08-14 DIAGNOSIS — M5416 Radiculopathy, lumbar region: Secondary | ICD-10-CM | POA: Diagnosis not present

## 2017-08-20 ENCOUNTER — Encounter: Payer: Self-pay | Admitting: Physician Assistant

## 2017-08-20 ENCOUNTER — Ambulatory Visit (INDEPENDENT_AMBULATORY_CARE_PROVIDER_SITE_OTHER): Payer: PPO | Admitting: Physician Assistant

## 2017-08-20 VITALS — BP 132/70 | HR 60 | Temp 97.6°F | Resp 16 | Wt 181.1 lb

## 2017-08-20 DIAGNOSIS — M5430 Sciatica, unspecified side: Secondary | ICD-10-CM

## 2017-08-20 DIAGNOSIS — M79671 Pain in right foot: Secondary | ICD-10-CM | POA: Diagnosis not present

## 2017-08-20 DIAGNOSIS — M5417 Radiculopathy, lumbosacral region: Secondary | ICD-10-CM

## 2017-08-20 DIAGNOSIS — M5416 Radiculopathy, lumbar region: Secondary | ICD-10-CM | POA: Insufficient documentation

## 2017-08-20 NOTE — Progress Notes (Signed)
Patient: Teresa [name] Female    DOB: 05/04/1941   75 y.o.   MRN: [id number] Visit Date: 08/20/2017  Today's Provider: Trinna Post, PA-C   Chief Complaint  Patient presents with  . Back Pain   Subjective:    HPI   Patient comes in today c/o lower back pain. Has history of lumbar radiculopathy and cervical DDD. She has had symptoms X 5 days. She reports that she went to pick butter beans and thinks she pulled a muscle. Has pain shooting down left leg. No numbness, incontinence. Pain with walking but able to walk. She has been taking Tylenol and Aleve with minimal relief. She has also been using a heating pad to help with her symptoms. She also reports that she does have history of DDD and she is having surgery in the fall. She does take 100 mg gabapentin daily for arthritis.    Allergies  Allergen Reactions  . Codeine     Altered mental status  . Ivp Dye [Iodinated Diagnostic Agents] Swelling  . Sulfa Antibiotics Hives     Current Outpatient Prescriptions:  .  Apremilast (OTEZLA) 30 MG TABS, Take 30 mg by mouth 2 (two) times daily., Disp: , Rfl:  .  aspirin EC 81 MG tablet, Take 1 tablet (81 mg total) by mouth daily., Disp: 90 tablet, Rfl: 3 .  budesonide-formoterol (SYMBICORT) 160-4.5 MCG/ACT inhaler, Inhale 2 puffs into the lungs 2 (two) times daily., Disp: 1 Inhaler, Rfl: 3 .  calcium carbonate (OSCAL) 1500 (600 Ca) MG TABS tablet, Take by mouth daily., Disp: , Rfl:  .  diphenhydrAMINE (BENADRYL) 25 MG tablet, Take 25 mg by mouth every 6 (six) hours as needed., Disp: , Rfl:  .  fluticasone (FLONASE) 50 MCG/ACT nasal spray, Place 1 spray into both nostrils daily., Disp: , Rfl:  .  gabapentin (NEURONTIN) 100 MG capsule, Take 100 mg by mouth 3 (three) times daily., Disp: , Rfl:  .  meclizine (ANTIVERT) 25 MG tablet, Take 25 mg by mouth as needed. , Disp: , Rfl: 1 .  Melatonin 10-10 MG TBCR, Take by mouth., Disp: , Rfl:  .  meloxicam (MOBIC) 7.5 MG tablet, Take 1  tablet (7.5 mg total) by mouth daily., Disp: 30 tablet, Rfl: 3 .  metoprolol succinate (TOPROL-XL) 25 MG 24 hr tablet, TAKE ONE TABLET BY MOUTH ONCE DAILY, Disp: 90 tablet, Rfl: 3 .  montelukast (SINGULAIR) 10 MG tablet, Take 10 mg by mouth daily. , Disp: , Rfl: 12 .  omeprazole (PRILOSEC) 20 MG capsule, Take 1 capsule (20 mg total) by mouth daily., Disp: 30 capsule, Rfl: 3 .  venlafaxine (EFFEXOR) 75 MG tablet, TAKE TWO TABLETS BY MOUTH ONCE DAILY, Disp: 60 tablet, Rfl: 11 .  Vitamin D, Ergocalciferol, (DRISDOL) 50000 units CAPS capsule, Take 50,000 Units by mouth every 7 (seven) days., Disp: , Rfl:  .  amoxicillin (AMOXIL) 500 MG capsule, Take 1 capsule (500 mg total) by mouth See admin instructions. 4 PO 1 hour prior to procedure, Disp: 4 capsule, Rfl: 4 .  rosuvastatin (CRESTOR) 5 MG tablet, Take 1 tablet (5 mg total) by mouth daily., Disp: 30 tablet, Rfl: 11  Review of Systems  Constitutional: Positive for activity change and fatigue.  Musculoskeletal: Positive for arthralgias, back pain, myalgias, neck pain and neck stiffness.  Skin: Negative.   Neurological: Negative.     Social History  Substance Use Topics  . Smoking status: Former Smoker    Packs/day: 0.50  Years: 35.00    Types: Cigarettes  . Smokeless tobacco: Former Systems developer    Quit date: 12/22/1978     Comment: Quit approx 1990  . Alcohol use No   Objective:   BP 132/70 (BP Location: Right Arm, Patient Position: Sitting, Cuff Size: Normal)   Pulse 60   Temp 97.6 F (36.4 C)   Resp 16   Wt 181 lb 1.6 oz (82.1 kg)   BMI 31.09 kg/m  Vitals:   08/20/17 1544  BP: 132/70  Pulse: 60  Resp: 16  Temp: 97.6 F (36.4 C)  Weight: 181 lb 1.6 oz (82.1 kg)     Physical Exam  Constitutional: She is oriented to person, place, and time. She appears well-developed and well-nourished.  Neurological: She is oriented to person, place, and time. She has normal reflexes. No sensory deficit. Coordination normal.  Strength 5/5 BLE.    Skin: Skin is warm and dry.  Psychiatric: She has a normal mood and affect. Her behavior is normal.        Assessment & Plan:     1. L-S radiculopathy  Can titrate up to 100 mg TID gabapentin. She is currently taking 100 mg daily. Can take 100 mg BID for 3-5 days and then increase to 100 mg TID for remainder.  2. Sciatic leg pain  See above.  Return if symptoms worsen or fail to improve.  The entirety of the information documented in the History of Present Illness, Review of Systems and Physical Exam were personally obtained by me. Portions of this information were initially documented by [facility] and reviewed by me for thoroughness and accuracy.             Trinna Post, PA-C  [facility]

## 2017-08-20 NOTE — Patient Instructions (Signed)

## 2017-08-25 ENCOUNTER — Other Ambulatory Visit: Payer: Self-pay | Admitting: *Deleted

## 2017-08-25 MED ORDER — ROSUVASTATIN CALCIUM 5 MG PO TABS: 5.0000 mg | ORAL_TABLET | Freq: Every day | ORAL | 3 refills | Status: DC

## 2017-09-07 DIAGNOSIS — L821 Other seborrheic keratosis: Secondary | ICD-10-CM | POA: Diagnosis not present

## 2017-09-07 DIAGNOSIS — D485 Neoplasm of uncertain behavior of skin: Secondary | ICD-10-CM | POA: Diagnosis not present

## 2017-09-07 DIAGNOSIS — X32XXXA Exposure to sunlight, initial encounter: Secondary | ICD-10-CM | POA: Diagnosis not present

## 2017-09-07 DIAGNOSIS — L565 Disseminated superficial actinic porokeratosis (DSAP): Secondary | ICD-10-CM | POA: Diagnosis not present

## 2017-09-07 DIAGNOSIS — D0471 Carcinoma in situ of skin of right lower limb, including hip: Secondary | ICD-10-CM | POA: Diagnosis not present

## 2017-09-07 DIAGNOSIS — D0439 Carcinoma in situ of skin of other parts of face: Secondary | ICD-10-CM | POA: Diagnosis not present

## 2017-09-07 DIAGNOSIS — B079 Viral wart, unspecified: Secondary | ICD-10-CM | POA: Diagnosis not present

## 2017-09-07 DIAGNOSIS — Z85828 Personal history of other malignant neoplasm of skin: Secondary | ICD-10-CM | POA: Diagnosis not present

## 2017-09-07 DIAGNOSIS — D0472 Carcinoma in situ of skin of left lower limb, including hip: Secondary | ICD-10-CM | POA: Diagnosis not present

## 2017-09-07 DIAGNOSIS — L570 Actinic keratosis: Secondary | ICD-10-CM | POA: Diagnosis not present

## 2017-09-16 ENCOUNTER — Ambulatory Visit (INDEPENDENT_AMBULATORY_CARE_PROVIDER_SITE_OTHER): Payer: PPO

## 2017-09-16 VITALS — BP 128/56 | HR 60 | Temp 97.6°F | Ht 64.0 in | Wt 184.2 lb

## 2017-09-16 DIAGNOSIS — Z23 Encounter for immunization: Secondary | ICD-10-CM | POA: Diagnosis not present

## 2017-09-16 DIAGNOSIS — Z13820 Encounter for screening for osteoporosis: Secondary | ICD-10-CM

## 2017-09-16 DIAGNOSIS — Z0000 Encounter for general adult medical examination without abnormal findings: Secondary | ICD-10-CM

## 2017-09-16 NOTE — Patient Instructions (Addendum)
Teresa [name] , Thank you for taking time to come for your Medicare Wellness Visit. I appreciate your ongoing commitment to your health goals. Please review the following plan we discussed and let me know if I can assist you in the future.   Screening recommendations/referrals: Colonoscopy: completed 08/07/17 Mammogram: completed 06/12/17 Bone Density: Referral sent today Recommended yearly ophthalmology/optometry visit for glaucoma screening and checkup Recommended yearly dental visit for hygiene and checkup  Vaccinations: Influenza vaccine: given today Pneumococcal vaccine: completed series Tdap vaccine: up to date Shingles vaccine: Contraindicated due to auto immune disorder   Advanced directives: Please bring a copy of your POA (Power of Attorney) and/or Living Will to your next appointment.   Conditions/risks identified: Recommend to increase fluid intake from 2 - 16 oz water bottles every day to 4 - 16 oz water bottles every day; Fall risk prevention discussed  Next appointment: Follow up with Dr. [name] on 10/21/17 @ 2:00pm. Schedule follow up for annual wellness exam in one year   Preventive Care 68 Years and Older, Female Preventive care refers to lifestyle choices and visits with your health care provider that can promote health and wellness. What does preventive care include?  A yearly physical exam. This is also called an annual well check.  Dental exams once or twice a year.  Routine eye exams. Ask your health care provider how often you should have your eyes checked.  Personal lifestyle choices, including:  Daily care of your teeth and gums.  Regular physical activity.  Eating a healthy diet.  Avoiding tobacco and drug use.  Limiting alcohol use.  Practicing safe sex.  Taking low-dose aspirin every day.  Taking vitamin and mineral supplements as recommended by your health care provider. What happens during an annual well check? The services and screenings  done by your health care provider during your annual well check will depend on your age, overall health, lifestyle risk factors, and family history of disease. Counseling  Your health care provider may ask you questions about your:  Alcohol use.  Tobacco use.  Drug use.  Emotional well-being.  Home and relationship well-being.  Sexual activity.  Eating habits.  History of falls.  Memory and ability to understand (cognition).  Work and work Statistician.  Reproductive health. Screening  You may have the following tests or measurements:  Height, weight, and BMI.  Blood pressure.  Lipid and cholesterol levels. These may be checked every 5 years, or more frequently if you are over 1 years old.  Skin check.  Lung cancer screening. You may have this screening every year starting at age 70 if you have a 30-pack-year history of smoking and currently smoke or have quit within the past 15 years.  Fecal occult blood test (FOBT) of the stool. You may have this test every year starting at age 30.  Flexible sigmoidoscopy or colonoscopy. You may have a sigmoidoscopy every 5 years or a colonoscopy every 10 years starting at age 22.  Hepatitis C blood test.  Hepatitis B blood test.  Sexually transmitted disease (STD) testing.  Diabetes screening. This is done by checking your blood sugar (glucose) after you have not eaten for a while (fasting). You may have this done every 1-3 years.  Bone density scan. This is done to screen for osteoporosis. You may have this done starting at age 42.  Mammogram. This may be done every 1-2 years. Talk to your health care provider about how often you should have regular mammograms. Talk with  your health care provider about your test results, treatment options, and if necessary, the need for more tests. Vaccines  Your health care provider may recommend certain vaccines, such as:  Influenza vaccine. This is recommended every year.  Tetanus,  diphtheria, and acellular pertussis (Tdap, Td) vaccine. You may need a Td booster every 10 years.  Zoster vaccine. You may need this after age 53.  Pneumococcal 13-valent conjugate (PCV13) vaccine. One dose is recommended after age 67.  Pneumococcal polysaccharide (PPSV23) vaccine. One dose is recommended after age 32. Talk to your health care provider about which screenings and vaccines you need and how often you need them. This information is not intended to replace advice given to you by your health care provider. Make sure you discuss any questions you have with your health care provider. Document Released: 01/04/2016 Document Revised: 08/27/2016 Document Reviewed: 10/09/2015 Elsevier Interactive Patient Education  2017 [place] Prevention in the Home Falls can cause injuries. They can happen to people of all ages. There are many things you can do to make your home safe and to help prevent falls. What can I do on the outside of my home?  Regularly fix the edges of walkways and driveways and fix any cracks.  Remove anything that might make you trip as you walk through a door, such as a raised step or threshold.  Trim any bushes or trees on the path to your home.  Use bright outdoor lighting.  Clear any walking paths of anything that might make someone trip, such as rocks or tools.  Regularly check to see if handrails are loose or broken. Make sure that both sides of any steps have handrails.  Any raised decks and porches should have guardrails on the edges.  Have any leaves, snow, or ice cleared regularly.  Use sand or salt on walking paths during winter.  Clean up any spills in your garage right away. This includes oil or grease spills. What can I do in the bathroom?  Use night lights.  Install grab bars by the toilet and in the tub and shower. Do not use towel bars as grab bars.  Use non-skid mats or decals in the tub or shower.  If you need to sit down in  the shower, use a plastic, non-slip stool.  Keep the floor dry. Clean up any water that spills on the floor as soon as it happens.  Remove soap buildup in the tub or shower regularly.  Attach bath mats securely with double-sided non-slip rug tape.  Do not have throw rugs and other things on the floor that can make you trip. What can I do in the bedroom?  Use night lights.  Make sure that you have a light by your bed that is easy to reach.  Do not use any sheets or blankets that are too big for your bed. They should not hang down onto the floor.  Have a firm chair that has side arms. You can use this for support while you get dressed.  Do not have throw rugs and other things on the floor that can make you trip. What can I do in the kitchen?  Clean up any spills right away.  Avoid walking on wet floors.  Keep items that you use a lot in easy-to-reach places.  If you need to reach something above you, use a strong step stool that has a grab bar.  Keep electrical cords out of the way.  Do not  use floor polish or wax that makes floors slippery. If you must use wax, use non-skid floor wax.  Do not have throw rugs and other things on the floor that can make you trip. What can I do with my stairs?  Do not leave any items on the stairs.  Make sure that there are handrails on both sides of the stairs and use them. Fix handrails that are broken or loose. Make sure that handrails are as long as the stairways.  Check any carpeting to make sure that it is firmly attached to the stairs. Fix any carpet that is loose or worn.  Avoid having throw rugs at the top or bottom of the stairs. If you do have throw rugs, attach them to the floor with carpet tape.  Make sure that you have a light switch at the top of the stairs and the bottom of the stairs. If you do not have them, ask someone to add them for you. What else can I do to help prevent falls?  Wear shoes that:  Do not have high  heels.  Have rubber bottoms.  Are comfortable and fit you well.  Are closed at the toe. Do not wear sandals.  If you use a stepladder:  Make sure that it is fully opened. Do not climb a closed stepladder.  Make sure that both sides of the stepladder are locked into place.  Ask someone to hold it for you, if possible.  Clearly mark and make sure that you can see:  Any grab bars or handrails.  First and last steps.  Where the edge of each step is.  Use tools that help you move around (mobility aids) if they are needed. These include:  Canes.  Walkers.  Scooters.  Crutches.  Turn on the lights when you go into a dark area. Replace any light bulbs as soon as they burn out.  Set up your furniture so you have a clear path. Avoid moving your furniture around.  If any of your floors are uneven, fix them.  If there are any pets around you, be aware of where they are.  Review your medicines with your doctor. Some medicines can make you feel dizzy. This can increase your chance of falling. Ask your doctor what other things that you can do to help prevent falls. This information is not intended to replace advice given to you by your health care provider. Make sure you discuss any questions you have with your health care provider. Document Released: 10/04/2009 Document Revised: 05/15/2016 Document Reviewed: 01/12/2015 Elsevier Interactive Patient Education  2017 [employer].

## 2017-09-16 NOTE — Progress Notes (Signed)
Subjective:   Teresa [name] is a 76 y.o. female who presents for Medicare Annual (Subsequent) preventive examination.  Review of Systems:  N/A Cardiac Risk Factors include: advanced age (>62men, >21 women);dyslipidemia;obesity (BMI >30kg/m2)     Objective:     Vitals: BP (!) 128/56 (BP Location: Right Arm, Patient Position: Sitting)   Pulse 60   Temp 97.6 F (36.4 C) (Oral)   Ht 5\' 4"  (1.626 m)   Wt 184 lb 3.2 oz (83.6 kg)   BMI 31.62 kg/m   Body mass index is 31.62 kg/m.   Tobacco History  Smoking Status  . Former Smoker  . Packs/day: 0.50  . Years: 35.00  . Types: Cigarettes  Smokeless Tobacco  . Former Systems developer  . Quit date: 12/22/1978    Comment: Quit approx 1990     Counseling given: Not Answered   Past Medical History:  Diagnosis Date  . Arthritis   . Asthma   . Back pain   . Breast cancer ([place]) 2009   left breast   . CAD (coronary artery disease)    CABG in 1994  . Cancer [facility])    breast cancer 2009  . Dental crowns present    caps- left back top, right back bottom  . Gastroesophageal reflux disease   . Hx of breast cancer   . Hypertension   . Ischemic cardiomyopathy    Most recent ejection fraction was 40% in 2013  . MI (myocardial infarction) ([place]) 1994  . Personal history of radiation therapy   . Psoriasis   . PVC's (premature ventricular contractions)   . Rheumatoid arthritis (HCC)    feet, hands  . Vertigo    approx 2x/yr   Past Surgical History:  Procedure Laterality Date  . ABDOMINAL HYSTERECTOMY    . back fusion    . BREAST BIOPSY Right    1991 negative  . BREAST EXCISIONAL BIOPSY Left    2009 positive  . BREAST LUMPECTOMY    . CARDIAC CATHETERIZATION     [facility]    . CORONARY ARTERY BYPASS GRAFT  1994   1 vessel - [facility]  . FOOT ARTHRODESIS Right 07/24/2016   Procedure: FUSION FIRST METATARSAL CUNEIFORM JOINT RIGHT FOOT, FUSION SECOND METATARSAL CUNEFORM JOINT BUNION REPAIR  RIGHT FOOT;  Surgeon: Albertine Patricia, DPM;  Location: [place];  Service: Podiatry;  Laterality: Right;  . FOOT FRACTURE SURGERY    . HARDWARE REMOVAL Right 07/24/2016   Procedure: REMOVAL HARDWARE LATERAL MALLEOUS RIGHT ANKLE;  Surgeon: Albertine Patricia, DPM;  Location: [place];  Service: Podiatry;  Laterality: Right;  REMOVAL OF PIN WHICH WAS INTACT  . HERNIA REPAIR    . TOTAL HIP ARTHROPLASTY Left   . TOTAL KNEE ARTHROPLASTY Left    Family History  Problem Relation Age of Onset  . Heart attack Father   . Congestive Heart Failure Mother   . Breast cancer Mother 61  . Kidney failure Sister   . Prostate cancer Brother   . Bladder Cancer Brother   . Lung cancer Sister   . Arthritis Sister   . Breast cancer Sister   . Arthritis Sister   . Breast cancer Sister   . Lung cancer Sister   . Lung cancer Brother   . Arthritis Brother    History  Sexual Activity  . Sexual activity: Not on file    Outpatient Encounter Prescriptions as of 09/16/2017  Medication Sig  . Apremilast (OTEZLA) 30 MG TABS Take 30 mg by mouth 2 (two) times daily.  Marland Kitchen aspirin EC 81 MG tablet Take 1 tablet (81 mg total) by mouth daily.  . budesonide-formoterol (SYMBICORT) 160-4.5 MCG/ACT inhaler Inhale 2 puffs into the lungs 2 (two) times daily. (Patient taking differently: Inhale 2 puffs into the lungs 2 (two) times daily as needed. )  . calcium carbonate (OSCAL) 1500 (600 Ca) MG TABS tablet Take by mouth daily.  . diphenhydrAMINE (BENADRYL) 25 MG tablet Take 25 mg by mouth every 6 (six) hours as needed.  . fluticasone (FLONASE) 50 MCG/ACT nasal spray Place 1 spray into both nostrils as needed.   . gabapentin (NEURONTIN) 100 MG capsule Take 100 mg by mouth 3 (three) times daily.  . meclizine (ANTIVERT) 25 MG tablet Take 25 mg by mouth as needed.   . metoprolol succinate (TOPROL-XL) 25 MG 24 hr tablet TAKE ONE TABLET BY MOUTH ONCE DAILY  . rosuvastatin (CRESTOR) 5 MG tablet Take 1 tablet (5 mg  total) by mouth daily.  Marland Kitchen venlafaxine (EFFEXOR) 75 MG tablet TAKE TWO TABLETS BY MOUTH ONCE DAILY  . Vitamin D, Ergocalciferol, (DRISDOL) 50000 units CAPS capsule Take 50,000 Units by mouth every 7 (seven) days.  Marland Kitchen amoxicillin (AMOXIL) 500 MG capsule Take 1 capsule (500 mg total) by mouth See admin instructions. 4 PO 1 hour prior to procedure (Patient not taking: Reported on 09/16/2017)  . Melatonin 10-10 MG TBCR Take by mouth as needed.   . meloxicam (MOBIC) 7.5 MG tablet Take 1 tablet (7.5 mg total) by mouth daily. (Patient not taking: Reported on 09/16/2017)  . montelukast (SINGULAIR) 10 MG tablet Take 10 mg by mouth as needed.   Marland Kitchen omeprazole (PRILOSEC) 20 MG capsule Take 1 capsule (20 mg total) by mouth daily. (Patient not taking: Reported on 09/16/2017)   No facility-administered encounter medications on file as of 09/16/2017.     Activities of Daily Living In your present state of health, do you have any difficulty performing the following activities: 09/16/2017 10/06/2016  Hearing? N N  Vision? N N  Difficulty concentrating or making decisions? N Y  Walking or climbing stairs? Y Y  Comment back and neck pain -  Dressing or bathing? N N  Doing errands, shopping? N N  Preparing Food and eating ? N -  Using the Toilet? N -  In the past six months, have you accidently leaked urine? Y -  Comment previous history of bladder surgery -  Do you have problems with loss of bowel control? Y -  Comment uncertain if food or medication related. Does not see a specialist -  Managing your Medications? N -  Managing your Finances? N -  Housekeeping or managing your Housekeeping? N -  Some recent data might be hidden    Patient Care Team: Jerrol Banana., MD as PCP - General (Family Medicine) Dasher, Rayvon Char, MD as Consulting Physician (Dermatology) Blanche East, MD as Consulting Physician (Neurosurgery)    Assessment:     Exercise Activities and Dietary  recommendations Current Exercise Habits: The patient does not participate in regular exercise at present, Exercise limited by: neurologic condition(s) (back and neck pain)  Goals    . Increase water intake          Recommend to increase fluid intake from 2 - 16 oz water bottles every day to 4 - 16 oz water bottles every day      Fall Risk Fall  Risk  09/16/2017 10/06/2016 10/04/2015 07/16/2015  Falls in the past year? No No No No   Depression Screen PHQ 2/9 Scores 09/16/2017 10/06/2016 10/04/2015 07/16/2015  PHQ - 2 Score 0 0 0 0     Cognitive Function     6CIT Screen 09/16/2017  What Year? 0 points  What month? 0 points  What time? 0 points  Count back from 20 0 points  Months in reverse 0 points  Repeat phrase 0 points  Total Score 0    Immunization History  Administered Date(s) Administered  . Influenza, High Dose Seasonal PF 10/04/2015, 10/06/2016, 09/16/2017  . Influenza-Unspecified 07/22/2014  . Pneumococcal Conjugate-13 10/06/2016   Screening Tests Health Maintenance  Topic Date Due  . DEXA SCAN  09/19/2006  . PNA vac Low Risk Adult (2 of 2 - PPSV23) 10/06/2017  . COLONOSCOPY  04/10/2018  . TETANUS/TDAP  08/09/2018  . INFLUENZA VACCINE  Completed      Plan:   I have personally reviewed and addressed the Medicare Annual Wellness questionnaire and have noted the following in the patient's chart:  A. Medical and social history B. Use of alcohol, tobacco or illicit drugs  C. Current medications and supplements D. Functional ability and status E.  Nutritional status F.  Physical activity G. Advance directives H. List of other physicians I.  Hospitalizations, surgeries, and ER visits in previous 12 months J.  [place] such as hearing and vision if needed, cognitive and depression L. Referrals and appointments - none  In addition, I have reviewed and discussed with patient certain preventive protocols, quality metrics, and best practice  recommendations. A written personalized care plan for preventive services as well as general preventive health recommendations were provided to patient.  See attached scanned questionnaire for additional information.   Signed,  Aleatha Borer, LPN Nurse Health Advisor   MD Recommendations: DEXA referral sent today

## 2017-09-17 ENCOUNTER — Ambulatory Visit: Payer: PPO

## 2017-09-21 ENCOUNTER — Other Ambulatory Visit: Payer: Self-pay | Admitting: Physician Assistant

## 2017-09-21 DIAGNOSIS — I10 Essential (primary) hypertension: Secondary | ICD-10-CM

## 2017-09-21 NOTE — Telephone Encounter (Signed)
Dr. Darnell Level patient. Not seen since Jan 2018 by Dr. Darnell Level. Has seen bob and adriana for acutes. Looks like she is due for wellness in 09/2017

## 2017-09-30 ENCOUNTER — Ambulatory Visit
Admission: RE | Admit: 2017-09-30 | Discharge: 2017-09-30 | Disposition: A | Discharge status: Home / Self Care | Payer: PPO | Source: Ambulatory Visit | Attending: Family Medicine | Admitting: Family Medicine

## 2017-09-30 DIAGNOSIS — M8588 Other specified disorders of bone density and structure, other site: Secondary | ICD-10-CM | POA: Insufficient documentation

## 2017-09-30 DIAGNOSIS — M85832 Other specified disorders of bone density and structure, left forearm: Secondary | ICD-10-CM | POA: Diagnosis not present

## 2017-09-30 DIAGNOSIS — Z13820 Encounter for screening for osteoporosis: Secondary | ICD-10-CM | POA: Insufficient documentation

## 2017-10-15 ENCOUNTER — Encounter: Payer: PPO | Admitting: Family Medicine

## 2017-10-20 DIAGNOSIS — L905 Scar conditions and fibrosis of skin: Secondary | ICD-10-CM | POA: Diagnosis not present

## 2017-10-20 DIAGNOSIS — D0439 Carcinoma in situ of skin of other parts of face: Secondary | ICD-10-CM | POA: Diagnosis not present

## 2017-10-21 ENCOUNTER — Ambulatory Visit (INDEPENDENT_AMBULATORY_CARE_PROVIDER_SITE_OTHER): Payer: PPO | Admitting: Family Medicine

## 2017-10-21 VITALS — BP 152/74 | HR 50 | Temp 98.3°F | Resp 16 | Wt 179.0 lb

## 2017-10-21 DIAGNOSIS — R5383 Other fatigue: Secondary | ICD-10-CM | POA: Diagnosis not present

## 2017-10-21 DIAGNOSIS — R001 Bradycardia, unspecified: Secondary | ICD-10-CM

## 2017-10-21 LAB — CBC WITH DIFFERENTIAL/PLATELET
Basophils Absolute: 71 cells/uL (ref 0–200)
Basophils Relative: 1 %
Eosinophils Absolute: 419 cells/uL (ref 15–500)
Eosinophils Relative: 5.9 %
HCT: 47.2 % — ABNORMAL HIGH (ref 35.0–45.0)
Hemoglobin: 15.4 g/dL (ref 11.7–15.5)
Lymphs Abs: 2265 cells/uL (ref 850–3900)
MCH: 28.9 pg (ref 27.0–33.0)
MCHC: 32.6 g/dL (ref 32.0–36.0)
MCV: 88.6 fL (ref 80.0–100.0)
MPV: 10.4 fL (ref 7.5–12.5)
Monocytes Relative: 10.4 %
Neutro Abs: 3607 cells/uL (ref 1500–7800)
Neutrophils Relative %: 50.8 %
Platelets: 278 10*3/uL (ref 140–400)
RBC: 5.33 10*6/uL — ABNORMAL HIGH (ref 3.80–5.10)
RDW: 12.9 % (ref 11.0–15.0)
Total Lymphocyte: 31.9 %
WBC mixed population: 738 cells/uL (ref 200–950)
WBC: 7.1 10*3/uL (ref 3.8–10.8)

## 2017-10-21 LAB — COMPLETE METABOLIC PANEL WITH GFR
AG Ratio: 1.6 (calc) (ref 1.0–2.5)
ALT: 15 U/L (ref 6–29)
AST: 19 U/L (ref 10–35)
Albumin: 4.3 g/dL (ref 3.6–5.1)
Alkaline phosphatase (APISO): 81 U/L (ref 33–130)
BUN: 11 mg/dL (ref 7–25)
CO2: 27 mmol/L (ref 20–32)
Calcium: 9.5 mg/dL (ref 8.6–10.4)
Chloride: 105 mmol/L (ref 98–110)
Creat: 0.7 mg/dL (ref 0.60–0.93)
GFR, Est African American: 98 mL/min/{1.73_m2} (ref >=60)
GFR, Est Non African American: 84 mL/min/{1.73_m2} (ref >=60)
Globulin: 2.7 g/dL (calc) (ref 1.9–3.7)
Glucose, Bld: 87 mg/dL (ref 65–99)
Potassium: 4.1 mmol/L (ref 3.5–5.3)
Sodium: 141 mmol/L (ref 135–146)
Total Bilirubin: 0.6 mg/dL (ref 0.2–1.2)
Total Protein: 7 g/dL (ref 6.1–8.1)

## 2017-10-21 LAB — TSH: TSH: 1.29 mIU/L (ref 0.40–4.50)

## 2017-10-21 NOTE — Progress Notes (Signed)
Patient: Teresa [name], Female    DOB: 12/14/1941, 76 y.o.   MRN: [id number] Visit Date: 10/22/2017  Today's Provider: [name], MD   No chief complaint on file.  Subjective:   Patient was originally here for her medicare physical.  After talking with her about all the symptoms she has checked it appears we need to do an acute visit for her today.  While checking her vital signs the patient was found to be bradycardic.  EKG was obtained and [rovider was notified.  Immunization History  Administered Date(s) Administered  . Influenza, High Dose Seasonal PF 10/04/2015, 10/06/2016, 09/16/2017  . Influenza-Unspecified 07/22/2014  . Pneumococcal Conjugate-13 10/06/2016    04/10/08 Colon, Wohl-diverticulosis, hemorrhoids 09/30/17 BMD-osteopenia 06/12/17 Mamm-neg 08/09/08 Pap-neg  After interviewing the patient and discussing her complaints she was advised that this would need to be considered a sick/acute visit and we would need to do her wellness exam another time.  She has agreed to this.  After obtaining her vital signs and her heart rate was found to be bradycardic she was advised we would address the more critical issues of her visit and those others would need to be done at another time.  Information was given to her about the risk of bradycardia vs. Her other symptoms and she has agreed taht this was the issue to address today.    Review of Systems  Constitutional: Negative.   HENT: Positive for mouth sores, nosebleeds, rhinorrhea, tinnitus and voice change.        Patient states these symptoms have been present for about 1 month.  They are starting to improve.  She then states she also has sores in her nose and mouth and they are improving as well.    Eyes: Positive for photophobia and redness.       Patient states her last eye exam was over 2 years ago.  She com plains that her eyes itch all the time and she relates this to allergies.   Respiratory: Positive for cough and  choking.        Patient states that she only has this when she drinks cold liquids.  Cardiovascular: Negative.   Gastrointestinal: Negative.   Endocrine: Heat intolerance: Patient states this is more like hot flashes.  Genitourinary: Positive for enuresis and frequency.  Musculoskeletal: Positive for back pain, gait problem, myalgias, neck pain and neck stiffness.       Patient states she has back pain that is followed by Neurosurgery.  They have told her that they will not do any surgery on her lower back until she has her neck issues addressed.  She see [facility] and [facility] for her neck issues and has follow up with them. Patient complains of foot pain for which she has seen Dr Elvina Mattes and had physical therapy.  She states the therapy did not help and she is going to discuss her pain with Dr Elvina Mattes.    Skin: Negative.   Allergic/Immunologic: Positive for immunocompromised state (Patient has history of psoriatic arthritis).  Neurological: Positive for numbness.  Hematological: Bruises/bleeds easily (Patient is on ASA 81 mg daily.).  Psychiatric/Behavioral: Positive for sleep disturbance.      Patient Active Problem List   Diagnosis Date Noted  . Asthma 05/09/2017  . 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 Arthralgia of both hands 01/31/2016  . Ventricular premature depolarization 09/11/2015  . Psoriasis 07/16/2015  . 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 Hyperlipidemia  03/29/2015  . Pre-syncope 03/04/2015  . CAD (coronary artery disease)   . PVC's (premature ventricular contractions)   . Ischemic cardiomyopathy   . DDD (degenerative disc disease), lumbar 01/18/2015  . Intervertebral disc disorder with radiculopathy of lumbar region 12/26/2014  . L-S radiculopathy 07/03/2014  . Breast CA ([place]) 06/28/2014  . Arthritis, degenerative 06/28/2014  . Acid reflux 03/08/2014  . 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     Social History   Social History  . Marital status: Divorced    Spouse name: N/A  . Number of children: N/A  . Years of education: N/A   Occupational History  . Not on file.   Social History Main Topics  . Smoking status: Former Smoker    Packs/day: 0.50    Years: 35.00    Types: Cigarettes  . Smokeless tobacco: Former Systems developer    Quit date: 12/22/1978     Comment: Quit approx 1990  . Alcohol use No  . Drug use: No  . Sexual activity: Not on file   Other Topics Concern  . Not on file   Social History Narrative  . No narrative on file    Past Surgical History:  Procedure Laterality Date  . ABDOMINAL HYSTERECTOMY    . back fusion    . BREAST BIOPSY Right    1991 negative  . BREAST EXCISIONAL BIOPSY Left    2009 positive  . BREAST LUMPECTOMY    . CARDIAC CATHETERIZATION     [place]    . CORONARY ARTERY BYPASS GRAFT  1994   1 vessel - [facility]  . FOOT ARTHRODESIS Right 07/24/2016   Procedure: FUSION FIRST METATARSAL CUNEIFORM JOINT RIGHT FOOT, FUSION SECOND METATARSAL CUNEFORM JOINT BUNION REPAIR RIGHT FOOT;  Surgeon: Albertine Patricia, DPM;  Location: [place];  Service: Podiatry;  Laterality: Right;  . FOOT FRACTURE SURGERY    . HARDWARE REMOVAL Right 07/24/2016   Procedure: REMOVAL HARDWARE LATERAL MALLEOUS RIGHT ANKLE;  Surgeon: Albertine Patricia, DPM;  Location: [place];  Service: Podiatry;  Laterality: Right;  REMOVAL OF PIN WHICH WAS INTACT  . HERNIA REPAIR    . TOTAL HIP ARTHROPLASTY Left   . TOTAL KNEE ARTHROPLASTY Left     Her family history includes Arthritis in her brother, sister, and sister; Bladder Cancer in her brother; Breast cancer in her sister and sister; Breast cancer (age of onset: 24) in her mother; Congestive Heart Failure in her mother; Heart attack in her father; Kidney failure in her sister; Lung cancer in her brother, sister, and sister; Prostate cancer in her brother.      Outpatient Encounter Prescriptions as of 10/21/2017  Medication Sig Note  . amoxicillin (AMOXIL) 500 MG capsule Take 1 capsule (500 mg total) by mouth See admin instructions. 4 PO 1 hour prior to procedure   . Apremilast (OTEZLA) 30 MG TABS Take 30 mg by mouth 2 (two) times daily.   Marland Kitchen aspirin EC 81 MG tablet Take 1 tablet (81 mg total) by mouth daily.   . budesonide-formoterol (SYMBICORT) 160-4.5 MCG/ACT inhaler Inhale 2 puffs into the lungs 2 (two) times daily. (Patient taking differently: Inhale 2 puffs into the lungs 2 (two) times daily as needed. )   . calcium carbonate (OSCAL) 1500 (600 Ca) MG TABS tablet Take by mouth daily.   . diphenhydrAMINE (BENADRYL) 25 MG tablet Take 25 mg by mouth every 6 (six) hours as needed. 02/03/2017:  PRN  . fluticasone (FLONASE) 50 MCG/ACT nasal spray Place 1 spray into both nostrils as needed.    . gabapentin (NEURONTIN) 100 MG capsule Take 100 mg by mouth 3 (three) times daily.   . meclizine (ANTIVERT) 25 MG tablet Take 25 mg by mouth as needed.  07/24/2016: PRN   . Melatonin 10-10 MG TBCR Take by mouth as needed.    . meloxicam (MOBIC) 7.5 MG tablet Take 1 tablet (7.5 mg total) by mouth daily.   . metoprolol succinate (TOPROL-XL) 25 MG 24 hr tablet TAKE ONE TABLET BY MOUTH ONCE DAILY   . montelukast (SINGULAIR) 10 MG tablet Take 10 mg by mouth as needed.  05/01/2015: Received from: External Pharmacy Received Sig:   . omeprazole (PRILOSEC) 20 MG capsule Take 1 capsule (20 mg total) by mouth daily.   . rosuvastatin (CRESTOR) 5 MG tablet Take 1 tablet (5 mg total) by mouth daily.   Marland Kitchen venlafaxine (EFFEXOR) 75 MG tablet TAKE TWO TABLETS BY MOUTH ONCE DAILY   . Vitamin D, Ergocalciferol, (DRISDOL) 50000 units CAPS capsule Take 50,000 Units by mouth every 7 (seven) days.    No facility-administered encounter medications on file as of 10/21/2017.     Allergies  Allergen Reactions  . Sulfa Antibiotics Hives  . Ivp Dye [Iodinated Diagnostic Agents] Swelling  .  Codeine Other (See Comments)    ALTERED MENTAL STATUS Altered mental status    Patient Care Team: Jerrol Banana., MD as PCP - General (Family Medicine) Dasher, Rayvon Char, MD as Consulting Physician (Dermatology) Abd-El-Barr, Rogue Jury, MD as Consulting Physician (Neurosurgery)   Objective:   Vitals:  Vitals:   10/21/17 1505  BP: (!) 152/74  Pulse: (!) 50  Resp: 16  Temp: 98.3 F (36.8 C)  TempSrc: Oral  Weight: 179 lb (81.2 kg)    Physical Exam  Constitutional: She is oriented to person, place, and time. She appears well-developed and well-nourished.  HENT:  Head: Normocephalic and atraumatic.  Right Ear: External ear normal.  Left Ear: External ear normal.  Nose: Nose normal.  Mouth/Throat: Oropharynx is clear and moist.  Eyes: Conjunctivae are normal. No scleral icterus.  Neck: Neck supple. No thyromegaly present.  Cardiovascular: Normal rate, regular rhythm and normal heart sounds.   Pulmonary/Chest: Effort normal and breath sounds normal.  Abdominal: Soft.  Neurological: She is alert and oriented to person, place, and time.  Skin: Skin is warm and dry.  Psychiatric: She has a normal mood and affect. Her behavior is normal. Judgment and thought content normal.    Activities of Daily Living In your present state of health, do you have any difficulty performing the following activities: 09/16/2017  Hearing? N  Vision? N  Difficulty concentrating or making decisions? N  Walking or climbing stairs? Y  Comment back and neck pain  Dressing or bathing? N  Doing errands, shopping? N  Preparing Food and eating ? N  Using the Toilet? N  In the past six months, have you accidently leaked urine? Y  Comment previous history of bladder surgery  Do you have problems with loss of bowel control? Y  Comment uncertain if food or medication related. Does not see a specialist  Managing your Medications? N  Managing your Finances? N  Housekeeping or managing your  Housekeeping? N  Some recent data might be hidden    Fall Risk Assessment Fall Risk  09/16/2017 10/06/2016 10/04/2015 07/16/2015  Falls in the past year? No No No No  Depression Screen PHQ 2/9 Scores 10/22/2017 09/16/2017 10/06/2016 10/04/2015  PHQ - 2 Score 1 0 0 0  PHQ- 9 Score 7 - - -       Assessment & Plan:  CAD/s/p CABG Refer back to cardiology to reassess. HTN HLD OA/DDD MDD/GAD   I have done the exam and reviewed the chart and it is accurate to the best of my knowledge. Development worker, community has been used and  any errors in dictation or transcription are unintentional. Miguel Aschoff M.D. [facility]

## 2017-10-28 DIAGNOSIS — D0472 Carcinoma in situ of skin of left lower limb, including hip: Secondary | ICD-10-CM | POA: Diagnosis not present

## 2017-11-03 ENCOUNTER — Encounter: Payer: Self-pay | Admitting: Family Medicine

## 2017-11-17 ENCOUNTER — Ambulatory Visit: Payer: PPO | Admitting: Nurse Practitioner

## 2017-11-17 ENCOUNTER — Encounter: Payer: Self-pay | Admitting: Nurse Practitioner

## 2017-11-17 VITALS — BP 132/82 | HR 67 | Ht 64.0 in | Wt 180.5 lb

## 2017-11-17 DIAGNOSIS — E782 Mixed hyperlipidemia: Secondary | ICD-10-CM | POA: Diagnosis not present

## 2017-11-17 DIAGNOSIS — I200 Unstable angina: Secondary | ICD-10-CM

## 2017-11-17 DIAGNOSIS — I25110 Atherosclerotic heart disease of native coronary artery with unstable angina pectoris: Secondary | ICD-10-CM | POA: Diagnosis not present

## 2017-11-17 DIAGNOSIS — I10 Essential (primary) hypertension: Secondary | ICD-10-CM | POA: Diagnosis not present

## 2017-11-17 NOTE — Progress Notes (Signed)
Office Visit    Patient Name: Teresa [name] Date of Encounter: 11/17/2017  Primary Care Provider:  Jerrol Banana., MD Primary Cardiologist:  Jerilynn Mages. Fletcher Anon, MD   Chief Complaint    76 year old female with a history of CAD for, hypertension, hyperlipidemia, frequent PVCs, and chronic pain who presents for follow-up.  Past Medical History    Past Medical History:  Diagnosis Date  . Arthritis   . Asthma   . Back pain   . Breast cancer ([place]) 2009   left breast   . CAD (coronary artery disease)    a. 1994 s/p CABG; b. 03/2015 MV: No ischemia.  . Dental crowns present    caps- left back top, right back bottom  . Gastroesophageal reflux disease   . Hypertension   . Ischemic cardiomyopathy    a. 2013 EF 40%;  b. 03/2015 Echo: EF 55-60%, mild MR, mod dil LA, nl RV fxn.  . MI (myocardial infarction) ([place]) 1994  . Personal history of radiation therapy   . Psoriasis   . PSVT (paroxysmal supraventricular tachycardia) ([place])    a. 02/2015 Holter: short runs of SVT and NSVT.  Marland Kitchen PVC's (premature ventricular contractions)   . Rheumatoid arthritis ([facility])    feet, hands  . Vertigo    approx 2x/yr   Past Surgical History:  Procedure Laterality Date  . ABDOMINAL HYSTERECTOMY    . back fusion    . BREAST BIOPSY Right    1991 negative  . BREAST EXCISIONAL BIOPSY Left    2009 positive  . BREAST LUMPECTOMY    . CARDIAC CATHETERIZATION     [facility]    . CORONARY ARTERY BYPASS GRAFT  1994   1 vessel - [facility]  . FOOT ARTHRODESIS Right 07/24/2016   Procedure: FUSION FIRST METATARSAL CUNEIFORM JOINT RIGHT FOOT, FUSION SECOND METATARSAL CUNEFORM JOINT BUNION REPAIR RIGHT FOOT;  Surgeon: Albertine Patricia, DPM;  Location: [facility];  Service: Podiatry;  Laterality: Right;  . FOOT FRACTURE SURGERY    . HARDWARE REMOVAL Right 07/24/2016   Procedure: REMOVAL HARDWARE LATERAL MALLEOUS RIGHT ANKLE;  Surgeon: Albertine Patricia, DPM;  Location:  [facility];  Service: Podiatry;  Laterality: Right;  REMOVAL OF PIN WHICH WAS INTACT  . HERNIA REPAIR    . TOTAL HIP ARTHROPLASTY Left   . TOTAL KNEE ARTHROPLASTY Left     Allergies  Allergies  Allergen Reactions  . Sulfa Antibiotics Hives  . Ivp Dye [Iodinated Diagnostic Agents] Swelling  . Codeine Other (See Comments)    ALTERED MENTAL STATUS Altered mental status    History of Present Illness    76 year old female with the above past medical history including coronary artery disease status post coronary artery bypass grafting in 1994.  Other history includes ischemic cardiomyopathy with normalization of LV function by echo in April 2016, hypertension, hyperlipidemia, PSVT, frequent PVCs, breast cancer, and chronic pain.  Over the past few months, she has noted substernal chest discomfort that comes on almost exclusively with emotional upset, lasts 10-15 minutes, and resolves only after she is able to calm herself down.  There are no associated symptoms.  She had not been having symptoms like this until just a few months ago.  She is not active at all in the setting of chronic back and leg pain.  She does not experience chest discomfort with normal activities around her home.  She denies PND, orthopnea,  dizziness, syncope, palpitations, edema, or early satiety.  She was recently evaluated by primary care and there was some concern about possible bradycardia-heart rate recorded at 50.  It was at that time that she was referred back to cardiology.  Home Medications    Prior to Admission medications   Medication Sig Start Date End Date Taking? Authorizing Provider  amoxicillin (AMOXIL) 500 MG capsule Take 1 capsule (500 mg total) by mouth See admin instructions. 4 PO 1 hour prior to procedure 04/09/17  Yes Jerrol Banana., MD  Apremilast (OTEZLA) 30 MG TABS Take 30 mg by mouth 2 (two) times daily.   Yes [provider]  aspirin EC 81 MG tablet Take 1 tablet (81 mg  total) by mouth daily. 03/03/16  Yes Wellington Hampshire, MD  budesonide-formoterol (SYMBICORT) 160-4.5 MCG/ACT inhaler Inhale 2 puffs into the lungs 2 (two) times daily. Patient taking differently: Inhale 2 puffs into the lungs 2 (two) times daily as needed.  10/06/16  Yes Jerrol Banana., MD  calcium carbonate (OSCAL) 1500 (600 Ca) MG TABS tablet Take by mouth daily.   Yes [provider]  diphenhydrAMINE (BENADRYL) 25 MG tablet Take 25 mg by mouth every 6 (six) hours as needed.   Yes [provider]  fluticasone (FLONASE) 50 MCG/ACT nasal spray Place 1 spray into both nostrils as needed.    Yes [provider]  meclizine (ANTIVERT) 25 MG tablet Take 25 mg by mouth as needed.  04/03/15  Yes [provider]  Melatonin 10-10 MG TBCR Take by mouth as needed.    Yes [provider]  meloxicam (MOBIC) 7.5 MG tablet Take 1 tablet (7.5 mg total) by mouth daily. 10/06/16  Yes Jerrol Banana., MD  metoprolol succinate (TOPROL-XL) 25 MG 24 hr tablet TAKE ONE TABLET BY MOUTH ONCE DAILY 09/21/17  Yes Jerrol Banana., MD  montelukast (SINGULAIR) 10 MG tablet Take 10 mg by mouth as needed.  04/02/15  Yes [provider]  omeprazole (PRILOSEC) 20 MG capsule Take 1 capsule (20 mg total) by mouth daily. 12/23/16  Yes Carles Collet M, PA-C  rosuvastatin (CRESTOR) 5 MG tablet Take 1 tablet (5 mg total) by mouth daily. 08/25/17 11/23/17 Yes Wellington Hampshire, MD  venlafaxine (EFFEXOR) 75 MG tablet TAKE TWO TABLETS BY MOUTH ONCE DAILY 04/06/17  Yes Jerrol Banana., MD  Vitamin D, Ergocalciferol, (DRISDOL) 50000 units CAPS capsule Take 50,000 Units by mouth every 7 (seven) days.    [provider]    Review of Systems    Intermittent substernal chest discomfort occurring about once a week with emotional upset lasting up to 10-15 minutes and resolving spontaneously.  No associated symptoms.  She denies dyspnea, PND, orthopnea,  palpitations, dizziness, syncope, edema, or early satiety.  All other systems reviewed and are otherwise negative except as noted above.  Physical Exam    VS:  BP 132/82 (BP Location: Left Arm, Patient Position: Sitting, Cuff Size: Normal)   Pulse 67   Ht 5\' 4"  (1.626 m)   Wt 180 lb 8 oz (81.9 kg)   BMI 30.98 kg/m  , BMI Body mass index is 30.98 kg/m. GEN: Well nourished, well developed, in no acute distress.  HEENT: normal.  Neck: Supple, no JVD, carotid bruits, or masses. Cardiac: RRR, no murmurs, rubs, or gallops. No clubbing, cyanosis, edema.  Radials/DP/PT 2+ and equal bilaterally.  Respiratory:  Respirations regular and unlabored, clear to auscultation bilaterally. GI: Soft,  nontender, nondistended, BS + x 4. MS: no deformity or atrophy. Skin: warm and dry, no rash. Neuro:  Strength and sensation are intact. Psych: Normal affect.  Accessory Clinical Findings    ECG -regular sinus rhythm, 67, frequent PVCs, left bundle branch block, left axis deviation, no acute changes.  Assessment & Plan    1.  Unstable angina/CAD: Patient reports a several month history of intermittent substernal chest discomfort occurring about once a month, exclusively with emotional upset, lasting up to 10-15 minutes, and resolving only after she calms herself.  She does not exert herself very much and in that setting has not had any exertional symptoms.  Her chest discomfort is similar to prior anginal equivalent.  She remains on aspirin, beta-blocker, and statin therapy.  I will arrange for a Lexiscan Myoview to assess for ischemia.  2.  Essential hypertension: Stable.  3.  Hyperlipidemia: Tolerating rosuvastatin at a low dose.  LDL was 80 in July.  Normal LFTs in late October.  She wishes to stay on the same dose of rosuvastatin.  4.  PVCs: Patient has a history of frequent PVCs.  These are asymptomatic.  I suspect that bradycardia noted at PCP office visit may have been in the setting of PVCs, which  would not generate a pulse.  Continue current dose of beta-blocker.  5.  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  She is giving consideration to surgery in both locations.  As above, will obtain stress testing, which will also serve as a form of preoperative ischemic evaluation.    6.  Disposition: Follow-up stress testing as above.  Follow-up with Dr. Fletcher Anon in 2-3 months or sooner if necessary.  Murray Hodgkins, NP 11/17/2017, 12:50 PM

## 2017-11-17 NOTE — Patient Instructions (Addendum)
Medication Instructions:  Please continue your current medications  Labwork: None  Testing/Procedures: [place]  Your caregiver has ordered a Stress Test with nuclear imaging. The purpose of this test is to evaluate the blood supply to your heart muscle. This procedure is referred to as a "Non-Invasive Stress Test." This is because other than having an IV started in your vein, nothing is inserted or "invades" your body. Cardiac stress tests are done to find areas of poor blood flow to the heart by determining the extent of coronary artery disease (CAD). Some patients exercise on a treadmill, which naturally increases the blood flow to your heart, while others who are  unable to walk on a treadmill due to physical limitations have a pharmacologic/chemical stress agent called Lexiscan . This medicine will mimic walking on a treadmill by temporarily increasing your coronary blood flow.   Please note: these test may take anywhere between 2-4 hours to complete  PLEASE REPORT TO [facility] AT THE FIRST DESK WILL DIRECT YOU WHERE TO GO  Date of Procedure:_____Friday, November 30____  Arrival Time for Procedure:____7:15 am_____  Instructions regarding medication:   __X__:  Hold METOPROLOL the night before and morning of procedure  PLEASE NOTIFY THE OFFICE AT LEAST 24 HOURS IN ADVANCE IF YOU ARE UNABLE TO KEEP YOUR APPOINTMENT.  [phone] AND  PLEASE NOTIFY NUCLEAR MEDICINE AT [facility] AT LEAST 24 HOURS IN ADVANCE IF YOU ARE UNABLE TO KEEP YOUR APPOINTMENT. [phone]  How to prepare for your Myoview test:  1. Do not eat or drink after midnight 2. No caffeine for 24 hours prior to test 3. No smoking 24 hours prior to test. 4. Your medication may be taken with water.  If your doctor stopped a medication because of this test, do not take that medication. 5. Ladies, please do not wear dresses.  Skirts or pants are appropriate. Please wear a short sleeve  shirt. 6. No perfume, cologne or lotion.  Follow-Up: 3 months w/ Dr. Fletcher Anon  If you need a refill on your cardiac medications before your next appointment, please call your pharmacy.  Cardiac Nuclear Scan A cardiac nuclear scan is a test that measures blood flow to the heart when a person is resting and when he or she is exercising. The test looks for problems such as:  Not enough blood reaching a portion of the heart.  The heart muscle not working normally.  You may need this test if:  You have heart disease.  You have had abnormal lab results.  You have had heart surgery or angioplasty.  You have chest pain.  You have shortness of breath.  In this test, a radioactive dye (tracer) is injected into your bloodstream. After the tracer has traveled to your heart, an imaging device is used to measure how much of the tracer is absorbed by or distributed to various areas of your heart. This procedure is usually done at a hospital and takes 2-4 hours. Tell a health care provider about:  Any allergies you have.  All medicines you are taking, including vitamins, herbs, eye drops, creams, and over-the-counter medicines.  Any problems you or family members have had with the use of anesthetic medicines.  Any blood disorders you have.  Any surgeries you have had.  Any medical conditions you have.  Whether you are pregnant or may be pregnant. What are the risks? Generally, this is a safe procedure. However, problems may occur, including:  Serious chest pain and  heart attack. This is only a risk if the stress portion of the test is done.  Rapid heartbeat.  Sensation of warmth in your chest. This usually passes quickly.  What happens before the procedure?  Ask your health care provider about changing or stopping your regular medicines. This is especially important if you are taking diabetes medicines or blood thinners.  Remove your jewelry on the day of the procedure. What  happens during the procedure?  An IV tube will be inserted into one of your veins.  Your health care provider will inject a small amount of radioactive tracer through the tube.  You will wait for 20-40 minutes while the tracer travels through your bloodstream.  Your heart activity will be monitored with an electrocardiogram (ECG).  You will lie down on an exam table.  Images of your heart will be taken for about 15-20 minutes.  You may be asked to exercise on a treadmill or stationary bike. While you exercise, your heart's activity will be monitored with an ECG, and your blood pressure will be checked. If you are unable to exercise, you may be given a medicine to increase blood flow to parts of your heart.  When blood flow to your heart has peaked, a tracer will again be injected through the IV tube.  After 20-40 minutes, you will get back on the exam table and have more images taken of your heart.  When the procedure is over, your IV tube will be removed. The procedure may vary among health care providers and hospitals. Depending on the type of tracer used, scans may need to be repeated 3-4 hours later. What happens after the procedure?  Unless your health care provider tells you otherwise, you may return to your normal schedule, including diet, activities, and medicines.  Unless your health care provider tells you otherwise, you may increase your fluid intake. This will help flush the contrast dye from your body. Drink enough fluid to keep your urine clear or pale yellow.  It is up to you to get your test results. Ask your health care provider, or the department that is doing the test, when your results will be ready. Summary  A cardiac nuclear scan measures the blood flow to the heart when a person is resting and when he or she is exercising.  You may need this test if you are at risk for heart disease.  Tell your health care provider if you are pregnant.  Unless your health  care provider tells you otherwise, increase your fluid intake. This will help flush the contrast dye from your body. Drink enough fluid to keep your urine clear or pale yellow. This information is not intended to replace advice given to you by your health care provider. Make sure you discuss any questions you have with your health care provider. Document Released: 01/02/2005 Document Revised: 12/10/2016 Document Reviewed: 11/16/2013 Elsevier Interactive Patient Education  2017 [employer].

## 2017-11-18 ENCOUNTER — Other Ambulatory Visit: Payer: Self-pay | Admitting: Family Medicine

## 2017-11-18 DIAGNOSIS — M1990 Unspecified osteoarthritis, unspecified site: Secondary | ICD-10-CM

## 2017-11-18 MED ORDER — MELOXICAM 7.5 MG PO TABS: 7.5000 mg | ORAL_TABLET | Freq: Every day | ORAL | 3 refills | Status: DC

## 2017-11-18 NOTE — Telephone Encounter (Signed)
Pt contacted office for refill request on the following medications:  meloxicam (MOBIC) 7.5 MG tablet   [facility]  CB#[phone]/MW

## 2017-11-20 ENCOUNTER — Encounter
Admission: RE | Admit: 2017-11-20 | Discharge: 2017-11-20 | Disposition: A | Discharge status: Home / Self Care | Payer: PPO | Source: Ambulatory Visit | Attending: Nurse Practitioner | Admitting: Nurse Practitioner

## 2017-11-20 DIAGNOSIS — I200 Unstable angina: Secondary | ICD-10-CM | POA: Diagnosis not present

## 2017-11-20 LAB — NM MYOCAR MULTI W/SPECT W/WALL MOTION / EF
Estimated workload: 1 METS
Exercise duration (min): 0 min
Exercise duration (sec): 0 s
LV dias vol: 119 mL (ref 46–106)
LV sys vol: 58 mL
MPHR: 144 {beats}/min
Peak HR: 86 {beats}/min
Percent HR: 64 %
Percent of predicted max HR: 59 %
Rest HR: 70 {beats}/min
SDS: 2
SRS: 7
SSS: 8
Stage 1 Grade: 0 %
Stage 1 HR: 73 {beats}/min
Stage 1 Speed: 0 mph
Stage 2 Grade: 0 %
Stage 2 HR: 70 {beats}/min
Stage 2 Speed: 0 mph
Stage 3 Grade: 0 %
Stage 3 HR: 86 {beats}/min
Stage 3 Speed: 0 mph
Stage 4 DBP: 76 mmHg
Stage 4 Grade: 0 %
Stage 4 HR: 87 {beats}/min
Stage 4 SBP: 142 mmHg
Stage 4 Speed: 0 mph
TID: 1.01

## 2017-11-20 MED ORDER — TECHNETIUM TC 99M TETROFOSMIN IV KIT
10.0000 | PACK | Freq: Once | INTRAVENOUS | Status: AC | PRN
  Administered 2017-11-20: 14.27 via INTRAVENOUS

## 2017-11-20 MED ORDER — REGADENOSON 0.4 MG/5ML IV SOLN
0.4000 mg | Freq: Once | INTRAVENOUS | Status: AC
  Administered 2017-11-20: 0.4 mg via INTRAVENOUS

## 2017-11-20 MED ORDER — TECHNETIUM TC 99M TETROFOSMIN IV KIT
30.0000 | PACK | Freq: Once | INTRAVENOUS | Status: AC | PRN
  Administered 2017-11-20: 33.37 via INTRAVENOUS

## 2017-11-23 ENCOUNTER — Other Ambulatory Visit: Payer: Self-pay

## 2017-11-23 ENCOUNTER — Telehealth: Payer: Self-pay | Admitting: Family Medicine

## 2017-11-23 DIAGNOSIS — I255 Ischemic cardiomyopathy: Secondary | ICD-10-CM

## 2017-11-23 NOTE — Telephone Encounter (Signed)
Pt contacted office for refill request on the following medications:  omeprazole (PRILOSEC) 20 MG capsule   [facility] [address]  CB#[phone]/MW

## 2017-12-04 ENCOUNTER — Ambulatory Visit (INDEPENDENT_AMBULATORY_CARE_PROVIDER_SITE_OTHER): Payer: PPO

## 2017-12-04 ENCOUNTER — Other Ambulatory Visit: Payer: Self-pay

## 2017-12-04 DIAGNOSIS — I255 Ischemic cardiomyopathy: Secondary | ICD-10-CM | POA: Diagnosis not present

## 2017-12-22 HISTORY — PX: CERVICAL FUSION: SHX112

## 2018-01-07 ENCOUNTER — Ambulatory Visit: Payer: Medicare HMO | Admitting: Cardiovascular Disease

## 2018-01-07 ENCOUNTER — Encounter: Payer: Self-pay | Admitting: Cardiovascular Disease

## 2018-01-07 VITALS — BP 128/78 | HR 69 | Ht 63.0 in | Wt 180.0 lb

## 2018-01-07 DIAGNOSIS — E785 Hyperlipidemia, unspecified: Secondary | ICD-10-CM

## 2018-01-07 DIAGNOSIS — I493 Ventricular premature depolarization: Secondary | ICD-10-CM

## 2018-01-07 DIAGNOSIS — I5022 Chronic systolic (congestive) heart failure: Secondary | ICD-10-CM | POA: Diagnosis not present

## 2018-01-07 DIAGNOSIS — I2510 Atherosclerotic heart disease of native coronary artery without angina pectoris: Secondary | ICD-10-CM | POA: Diagnosis not present

## 2018-01-07 DIAGNOSIS — Z01812 Encounter for preprocedural laboratory examination: Secondary | ICD-10-CM | POA: Diagnosis not present

## 2018-01-07 MED ORDER — PREDNISONE & DIPHENHYDRAMINE 3 X 50 MG & 1 X 50 MG PO KIT: 1.0000 | PACK | Freq: Four times a day (QID) | ORAL | 0 refills | Status: DC

## 2018-01-07 NOTE — Patient Instructions (Addendum)
Medication Instructions:  Your physician has recommended you make the following change in your medication:  TAKE prednisone 1 tablet by mouth prior to January 28 catheterization. Take one tablet January 27 at 5pm and at 11pm, and January 28 in the morning before procedure.   Labwork: BMET, CBC, PT/INR today at the [facility]  Testing/Procedures: A chest x-ray takes a picture of the organs and structures inside the chest, including the heart, lungs, and blood vessels. This test can show several things, including, whether the heart is enlarges; whether fluid is building up in the lungs; and whether pacemaker / defibrillator leads are still in place.  Your physician has recommended that you wear a holter monitor. Holter monitors are medical devices that record the heart's electrical activity. Doctors most often use these monitors to diagnose arrhythmias. Arrhythmias are problems with the speed or rhythm of the heartbeat. The monitor is a small, portable device. You can wear one while you do your normal daily activities. This is usually used to diagnose what is causing palpitations/syncope (passing out).    Your physician has requested that you have a cardiac catheterization. Cardiac catheterization is used to diagnose and/or treat various heart conditions. Doctors may recommend this procedure for a number of different reasons. The most common reason is to evaluate chest pain. Chest pain can be a symptom of coronary artery disease (CAD), and cardiac catheterization can show whether plaque is narrowing or blocking your heart's arteries. This procedure is also used to evaluate the valves, as well as measure the blood flow and oxygen levels in different parts of your heart. For further information please visit [employer]. Please follow instruction sheet, as given.  [facility] Cardiac Cath Instructions   You are scheduled for a Cardiac Cath on: Monday, January 28  Please arrive at 8:30am on the  day of your procedure  Please expect a call from our [facility] to pre-register you  Do not eat/drink anything after midnight  Someone will need to drive you home  It is recommended someone be with you for the first 24 hours after your procedure  Wear clothes that are easy to get on/off and wear slip on shoes if possible   Medications bring a current list of all medications with you  _xx__ You may take all of your medications the morning of your procedure with enough water to swallow safely     Day of your procedure: Arrive at the [facility] entrance.  Free valet service is available.  After entering the [facility] please check-in at the registration desk (1st desk on your right) to receive your armband. After receiving your armband someone will escort you to the cardiac cath/special procedures waiting area.  The usual length of stay after your procedure is about 2 to 3 hours.  This can vary.  If you have any questions, please call our office at [phone], or you may call the cardiac cath lab at [facility] directly at [phone]  Referral to Dr. Virl Axe for PVCs  Follow-Up: Your physician recommends that you schedule a follow-up appointment in: 1 month with Dr. Fletcher Anon.    Any Other Special Instructions Will Be Listed Below (If Applicable).     If you need a refill on your cardiac medications before your next appointment, please call your pharmacy.   Angiogram An angiogram is an X-ray test. It is used to look at your blood vessels. For this test, a dye is put into the blood vessel being checked.  The dye shows up on X-rays. It helps your doctor see if there is a blockage or other problem in the blood vessel. What happens before the procedure?  Follow your doctor's instructions about limiting what you eat or drink.  Ask your doctor if you may drink enough water to take any needed medicines the morning of the test.  Plan to have someone take you  home after the test.  If you go home the same day as the test, plan to have someone stay with you for 24 hours. What happens during the procedure?  An IV tube will be put into one of your veins.  You will be given a medicine that makes you relax (sedative).  Your skin will be washed where the thin tube (catheter) will be put in. Hair may be removed from this area. The tube may be put into: ? Your upper leg area (groin). ? The fold of your arm, near your elbow. ? Your wrist.  You will be given a medicine that numbs the area where the tube will be inserted (local anesthetic).  The tube will be inserted into a blood vessel.  Using a type of X-ray (fluoroscopy) to see, your doctor will move the tube into the blood vessel to check it.  Dye will be put in through the tube. X-rays of your blood vessels will then be taken. Different doctors and hospitals may do this procedure differently. What happens after the procedure?  If the test is done through the leg, you will be kept in bed lying flat for several hours. You will be told to not bend or cross your legs.  The area where the tube was inserted will be checked often.  The pulse in your feet or wrist will be checked often.  More tests or X-rays may be done. This information is not intended to replace advice given to you by your health care provider. Make sure you discuss any questions you have with your health care provider. Document Released: 03/06/2009 Document Revised: 05/15/2016 Document Reviewed: 05/11/2013 Elsevier Interactive Patient Education  2017 [employer] This sheet gives you information about how to care for yourself after your procedure. Your health care provider may also give you more specific instructions. If you have problems or questions, contact your health care provider. What can I expect after the procedure? After the procedure, it is common to have bruising and tenderness at the  catheter insertion area. Follow these instructions at home: Insertion site care  Follow instructions from your health care provider about how to take care of your insertion site. Make sure you: ? Wash your hands with soap and water before you change your bandage (dressing). If soap and water are not available, use hand sanitizer. ? Change your dressing as told by your health care provider. ? Leave stitches (sutures), skin glue, or adhesive strips in place. These skin closures may need to stay in place for 2 weeks or longer. If adhesive strip edges start to loosen and curl up, you may trim the loose edges. Do not remove adhesive strips completely unless your health care provider tells you to do that.  Do not take baths, swim, or use a hot tub until your health care provider approves.  You may shower 24-48 hours after the procedure or as told by your health care provider. ? Gently wash the site with plain soap and water. ? Pat the area dry with a clean towel. ? Do not  rub the site. This may cause bleeding.  Do not apply powder or lotion to the site. Keep the site clean and dry.  Check your insertion site every day for signs of infection. Check for: ? Redness, swelling, or pain. ? Fluid or blood. ? Warmth. ? Pus or a bad smell. Activity  Rest as told by your health care provider, usually for 1-2 days.  Do not lift anything that is heavier than 10 lbs. (4.5 kg) or as told by your health care provider.  Do not drive for 24 hours if you were given a medicine to help you relax (sedative).  Do not drive or use heavy machinery while taking prescription pain medicine. General instructions  Return to your normal activities as told by your health care provider, usually in about a week. Ask your health care provider what activities are safe for you.  If the catheter site starts bleeding, lie flat and put pressure on the site. If the bleeding does not stop, get help right away. This is a  medical emergency.  Drink enough fluid to keep your urine clear or pale yellow. This helps flush the contrast dye from your body.  Take over-the-counter and prescription medicines only as told by your health care provider.  Keep all follow-up visits as told by your health care provider. This is important. Contact a health care provider if:  You have a fever or chills.  You have redness, swelling, or pain around your insertion site.  You have fluid or blood coming from your insertion site.  The insertion site feels warm to the touch.  You have pus or a bad smell coming from your insertion site.  You have bruising around the insertion site.  You notice blood collecting in the tissue around the catheter site (hematoma). The hematoma may be painful to the touch. Get help right away if:  You have severe pain at the catheter insertion area.  The catheter insertion area swells very fast.  The catheter insertion area is bleeding, and the bleeding does not stop when you hold steady pressure on the area.  The area near or just beyond the catheter insertion site becomes pale, cool, tingly, or numb. These symptoms may represent a serious problem that is an emergency. Do not wait to see if the symptoms will go away. Get medical help right away. Call your local emergency services (911 in the [place] Do not drive yourself to the hospital. Summary  After the procedure, it is common to have bruising and tenderness at the catheter insertion area.  After the procedure, it is important to rest and drink plenty of fluids.  Do not take baths, swim, or use a hot tub until your health care provider says it is okay to do so. You may shower 24-48 hours after the procedure or as told by your health care provider.  If the catheter site starts bleeding, lie flat and put pressure on the site. If the bleeding does not stop, get help right away. This is a medical emergency. This information is not intended  to replace advice given to you by your health care provider. Make sure you discuss any questions you have with your health care provider. Document Released: 06/26/2005 Document Revised: 11/12/2016 Document Reviewed: 11/12/2016 Elsevier Interactive Patient Education  2018 [employer].  Holter Monitoring A Holter monitor is a small device that is used to detect abnormal heart rhythms. It clips to your clothing and is connected by wires to flat, sticky  disks (electrodes) that attach to your chest. It is worn continuously for 24-48 hours. Follow these instructions at home:  Wear your Holter monitor at all times, even while exercising and sleeping, for as long as directed by your health care provider.  Make sure that the Holter monitor is safely clipped to your clothing or close to your body as recommended by your health care provider.  Do not get the monitor or wires wet.  Do not put body lotion or moisturizer on your chest.  Keep your skin clean.  Keep a diary of your daily activities, such as walking and doing chores. If you feel that your heartbeat is abnormal or that your heart is fluttering or skipping a beat: ? Record what you are doing when it happens. ? Record what time of day the symptoms occur.  Return your Holter monitor as directed by your health care provider.  Keep all follow-up visits as directed by your health care provider. This is important. Get help right away if:  You feel lightheaded or you faint.  You have trouble breathing.  You feel pain in your chest, upper arm, or jaw.  You feel sick to your stomach and your skin is pale, cool, or damp.  You heartbeat feels unusual or abnormal. This information is not intended to replace advice given to you by your health care provider. Make sure you discuss any questions you have with your health care provider. Document Released: 09/05/2004 Document Revised: 05/15/2016 Document Reviewed: 07/17/2014 Elsevier Interactive  Patient Education  [employer].

## 2018-01-07 NOTE — Progress Notes (Signed)
Cardiology Office Note   Date:  01/08/2018   ID:  Teresa [name], DOB Sep 04, 1941, MRN [id number]  PCP:  Jerrol Banana., MD  Cardiologist:   Kathlyn Sacramento, MD   Chief Complaint  Patient presents with  . other    F/u echo. Meds reviewed verbally with pt.      History of Present Illness: Teresa [name] is a 77 y.o. female who presents for a follow-up visit regarding coronary artery disease. She is status post CABG in 1994 with no ischemic cardiac events since then. She is known to have mild ischemic cardiomyopathy . She was diagnosed with PVCs in 2015 and was started on small dose Toprol. She does have underlying left bundle branch block. She has no history of diabetes, hypertension or hyperlipidemia. She is not a smoker. There is family history of coronary artery disease. She had cardiac workup in March 2016 for dizziness and presyncope. Holter monitor  showed normal sinus rhythm with short runs of SVT and nonsustained ventricular tachycardia. Echocardiogram showed normal LV systolic function with an ejection fraction of 55-60%, mild mitral regurgitation, moderately dilated left atrium and mild pulmonary hypertension. She was seen by Ignacia Bayley in November for chest pain happening mainly with emotional stress.  She underwent a pharmacologic nuclear stress test which showed a small fixed apical defect likely due to breast attenuation, ejection fraction of 42% and frequent PVCs. She had an echocardiogram done which showed an EF of 35-40%, mild to moderate mitral regurgitation, moderately dilated left atrium and moderate pulmonary hypertension with peak systolic pressure of 53 mmHg. She continue to have substernal chest tightness with regular activities.  She complains of increased shortness of breath and fatigue.  She continues to have palpitations.   Past Medical History:  Diagnosis Date  . Arthritis   . Asthma   . Back pain   . Breast cancer ([place]) 2009   left breast   .  CAD (coronary artery disease)    a. 1994 s/p CABG; b. 03/2015 MV: No ischemia.  . Dental crowns present    caps- left back top, right back bottom  . Gastroesophageal reflux disease   . Hypertension   . Ischemic cardiomyopathy    a. 2013 EF 40%;  b. 03/2015 Echo: EF 55-60%, mild MR, mod dil LA, nl RV fxn.  . MI (myocardial infarction) ([place]) 1994  . Personal history of radiation therapy   . Psoriasis   . PSVT (paroxysmal supraventricular tachycardia) ([place])    a. 02/2015 Holter: short runs of SVT and NSVT.  Marland Kitchen PVC's (premature ventricular contractions)   . Rheumatoid arthritis ([facility])    feet, hands  . Vertigo    approx 2x/yr    Past Surgical History:  Procedure Laterality Date  . ABDOMINAL HYSTERECTOMY    . back fusion    . BREAST BIOPSY Right    1991 negative  . BREAST EXCISIONAL BIOPSY Left    2009 positive  . BREAST LUMPECTOMY    . CARDIAC CATHETERIZATION     Protivin    . CORONARY ARTERY BYPASS GRAFT  1994   1 vessel - [facility]  . FOOT ARTHRODESIS Right 07/24/2016   Procedure: FUSION FIRST METATARSAL CUNEIFORM JOINT RIGHT FOOT, FUSION SECOND METATARSAL CUNEFORM JOINT BUNION REPAIR RIGHT FOOT;  Surgeon: Albertine Patricia, DPM;  Location: [facility];  Service: Podiatry;  Laterality: Right;  . FOOT FRACTURE SURGERY    .  HARDWARE REMOVAL Right 07/24/2016   Procedure: REMOVAL HARDWARE LATERAL MALLEOUS RIGHT ANKLE;  Surgeon: Albertine Patricia, DPM;  Location: [facility];  Service: Podiatry;  Laterality: Right;  REMOVAL OF PIN WHICH WAS INTACT  . HERNIA REPAIR    . TOTAL HIP ARTHROPLASTY Left   . TOTAL KNEE ARTHROPLASTY Left      Current Outpatient Medications  Medication Sig Dispense Refill  . amoxicillin (AMOXIL) 500 MG capsule Take 1 capsule (500 mg total) by mouth See admin instructions. 4 PO 1 hour prior to procedure (Patient taking differently: Take 500 mg by mouth See admin instructions. Take 2000 mg by mouth 1 hour  prior to dental appointment) 4 capsule 4  . Apremilast (OTEZLA) 30 MG TABS Take 30 mg by mouth 2 (two) times daily.    Marland Kitchen aspirin EC 81 MG tablet Take 1 tablet (81 mg total) by mouth daily. 90 tablet 3  . budesonide-formoterol (SYMBICORT) 160-4.5 MCG/ACT inhaler Inhale 2 puffs into the lungs 2 (two) times daily. (Patient taking differently: Inhale 2 puffs into the lungs 2 (two) times daily as needed (for shortness of breath). ) 1 Inhaler 3  . diphenhydrAMINE (BENADRYL) 25 MG tablet Take 25 mg by mouth every 6 (six) hours as needed for allergies.     Marland Kitchen meclizine (ANTIVERT) 25 MG tablet Take 25 mg by mouth 3 (three) times daily as needed for dizziness.   1  . meloxicam (MOBIC) 7.5 MG tablet Take 1 tablet (7.5 mg total) by mouth daily. (Patient taking differently: Take 7.5 mg by mouth daily as needed for pain. ) 30 tablet 3  . metoprolol succinate (TOPROL-XL) 25 MG 24 hr tablet TAKE ONE TABLET BY MOUTH ONCE DAILY (Patient taking differently: TAKE 25 MG BY MOUTH ONCE DAILY) 90 tablet 3  . montelukast (SINGULAIR) 10 MG tablet Take 10 mg by mouth daily as needed (for allergies).   12  . rosuvastatin (CRESTOR) 5 MG tablet Take 1 tablet (5 mg total) by mouth daily. 90 tablet 3  . acetaminophen (TYLENOL) 325 MG tablet Take 650 mg by mouth every 6 (six) hours as needed for moderate pain or headache.    . diphenhydrAMINE (BENADRYL) 50 MG tablet Take one tablet in the morning on January 28 before procedure. 1 tablet 0  . Multiple Vitamins-Minerals (MULTIVITAMIN PO) Take 1 tablet by mouth daily.    Vladimir Faster Glycol-Propyl Glycol (SYSTANE OP) Place 2 drops into both eyes daily.    . predniSONE (DELTASONE) 50 MG tablet Take one tablet January 27 at 5pm and 11pm and one tablet January 28 in the morning before procedure. 3 tablet 0   No current facility-administered medications for this visit.     Allergies:   Sulfa antibiotics; Ivp dye [iodinated diagnostic agents]; and Codeine    Social History:  The patient   reports that she has quit smoking. Her smoking use included cigarettes. She has a 17.50 pack-year smoking history. She quit smokeless tobacco use about 39 years ago. She reports that she does not drink alcohol or use drugs.   Family History:  The patient's family history includes Arthritis in her brother, sister, and sister; Bladder Cancer in her brother; Breast cancer in her sister and sister; Breast cancer (age of onset: 77) in her mother; Congestive Heart Failure in her mother; Heart attack in her father; Kidney failure in her sister; Lung cancer in her brother, sister, and sister; Prostate cancer in her brother.    ROS:  Please see the history of present  illness.   Otherwise, review of systems are positive for none.   All other systems are reviewed and negative.    PHYSICAL EXAM: VS:  BP 128/78 (BP Location: Left Arm, Patient Position: Sitting, Cuff Size: Normal)   Pulse 69   Ht 5\' 3"  (1.6 m)   Wt 180 lb (81.6 kg)   BMI 31.89 kg/m  , BMI Body mass index is 31.89 kg/m. GEN: Well nourished, well developed, in no acute distress  HEENT: normal  Neck: no JVD, carotid bruits, or masses Cardiac: RRR; no  rubs, or gallops,no edema . There is 1/ 6 systolic ejection murmur in the aortic area  Respiratory:  clear to auscultation bilaterally, normal work of breathing GI: soft, nontender, nondistended, + BS MS: no deformity or atrophy  Skin: warm and dry, no rash Neuro:  Strength and sensation are intact Psych: euthymic mood, full affect   EKG:  EKG is ordered today. The ekg ordered today demonstrates normal sinus rhythm with left bundle branch block and frequent PVCs.  Recent Labs: 10/21/2017: ALT 15; BUN 11; Creat 0.70; Hemoglobin 15.4; Platelets 278; Potassium 4.1; Sodium 141; TSH 1.29    Lipid Panel    Component Value Date/Time   CHOL 157 06/23/2017 1446   CHOL 169 07/08/2016 1023   TRIG 77 06/23/2017 1446   HDL 62 06/23/2017 1446   HDL 63 07/08/2016 1023   CHOLHDL 2.5  06/23/2017 1446   VLDL 15 06/23/2017 1446   LDLCALC 80 06/23/2017 1446   LDLCALC 91 07/08/2016 1023      Wt Readings from Last 3 Encounters:  01/07/18 180 lb (81.6 kg)  11/17/17 180 lb 8 oz (81.9 kg)  10/21/17 179 lb (81.2 kg)        ASSESSMENT AND PLAN:  1.  Coronary artery disease involving native coronary arteries worsening angina: Given her symptoms and drop in LV systolic function, I recommend proceeding with a right and left cardiac catheterization and possible PCI.  It appears that she had only LIMA to LAD and thus we will plan access via the left radial artery.  I discussed the procedure in details as well as risks and benefits.  We will premedicate for dye allergy.  2.  Frequent PVCs: I requested a Holter monitor to quantify.  Given the drop in her ejection fraction, I am going to refer her to Dr. Caryl Comes to consider antiarrhythmic treatment.  2.  Chronic systolic heart failure: Drop in ejection fraction could be due to ischemic cardiomyopathy or induced by PVCs.  Further evaluation as outlined above.  3. Essential hypertension: Blood pressure is controlled.  4. Hyperlipidemia: Previous myalgia with statins but she has been tolerating small dose rosuvastatin.  Most recent LDL was 80.     Disposition:   FU with me in 1 months  Signed,  Kathlyn Sacramento, MD  01/08/2018 5:19 PM    [place]

## 2018-01-08 ENCOUNTER — Other Ambulatory Visit: Payer: Self-pay

## 2018-01-08 MED ORDER — PREDNISONE 50 MG PO TABS: ORAL_TABLET | ORAL | 0 refills | Status: DC

## 2018-01-08 MED ORDER — DIPHENHYDRAMINE HCL 50 MG PO TABS: ORAL_TABLET | ORAL | 0 refills | Status: DC

## 2018-01-11 ENCOUNTER — Ambulatory Visit
Admission: RE | Admit: 2018-01-11 | Discharge: 2018-01-11 | Disposition: A | Discharge status: Home / Self Care | Payer: Medicare HMO | Source: Ambulatory Visit | Attending: Cardiovascular Disease | Admitting: Cardiovascular Disease

## 2018-01-11 ENCOUNTER — Other Ambulatory Visit
Admission: RE | Admit: 2018-01-11 | Discharge: 2018-01-11 | Disposition: A | Discharge status: Home / Self Care | Payer: Medicare HMO | Source: Ambulatory Visit | Attending: Cardiovascular Disease | Admitting: Cardiovascular Disease

## 2018-01-11 DIAGNOSIS — Z01818 Encounter for other preprocedural examination: Secondary | ICD-10-CM | POA: Diagnosis not present

## 2018-01-11 DIAGNOSIS — I2510 Atherosclerotic heart disease of native coronary artery without angina pectoris: Secondary | ICD-10-CM

## 2018-01-11 DIAGNOSIS — Z01812 Encounter for preprocedural laboratory examination: Secondary | ICD-10-CM

## 2018-01-11 DIAGNOSIS — I480 Paroxysmal atrial fibrillation: Secondary | ICD-10-CM | POA: Diagnosis not present

## 2018-01-11 LAB — CBC
HCT: 45.2 % (ref 35.0–47.0)
Hemoglobin: 14.6 g/dL (ref 12.0–16.0)
MCH: 29.4 pg (ref 26.0–34.0)
MCHC: 32.2 g/dL (ref 32.0–36.0)
MCV: 91.3 fL (ref 80.0–100.0)
Platelets: 215 10*3/uL (ref 150–440)
RBC: 4.95 MIL/uL (ref 3.80–5.20)
RDW: 15.1 % — ABNORMAL HIGH (ref 11.5–14.5)
WBC: 5.6 10*3/uL (ref 3.6–11.0)

## 2018-01-11 LAB — BASIC METABOLIC PANEL
Anion gap: 10 (ref 5–15)
BUN: 18 mg/dL (ref 6–20)
CO2: 24 mmol/L (ref 22–32)
Calcium: 9.3 mg/dL (ref 8.9–10.3)
Chloride: 107 mmol/L (ref 101–111)
Creatinine, Ser: 0.52 mg/dL (ref 0.44–1.00)
GFR calc Af Amer: >60 mL/min (ref >60)
GFR calc non Af Amer: >60 mL/min (ref >60)
Glucose, Bld: 99 mg/dL (ref 65–99)
Potassium: 4.1 mmol/L (ref 3.5–5.1)
Sodium: 141 mmol/L (ref 135–145)

## 2018-01-11 LAB — PROTIME-INR
INR: 1.02
Prothrombin Time: 13.3 seconds (ref 11.4–15.2)

## 2018-01-13 ENCOUNTER — Inpatient Hospital Stay
Admission: EM | Admit: 2018-01-13 | Discharge: 2018-01-15 | DRG: 287 | Disposition: A | Discharge status: Home / Self Care | Payer: Medicare HMO | Attending: Internal Medicine | Admitting: Internal Medicine

## 2018-01-13 ENCOUNTER — Emergency Department: Payer: Medicare HMO

## 2018-01-13 ENCOUNTER — Ambulatory Visit: Payer: Medicare HMO

## 2018-01-13 ENCOUNTER — Encounter: Payer: Self-pay | Admitting: Intensive Care

## 2018-01-13 ENCOUNTER — Other Ambulatory Visit: Payer: Self-pay

## 2018-01-13 ENCOUNTER — Encounter: Payer: Self-pay | Admitting: Physician Assistant

## 2018-01-13 ENCOUNTER — Ambulatory Visit (INDEPENDENT_AMBULATORY_CARE_PROVIDER_SITE_OTHER): Payer: Medicare HMO | Admitting: *Deleted

## 2018-01-13 VITALS — BP 142/80 | HR 124

## 2018-01-13 DIAGNOSIS — I10 Essential (primary) hypertension: Secondary | ICD-10-CM

## 2018-01-13 DIAGNOSIS — Z87891 Personal history of nicotine dependence: Secondary | ICD-10-CM | POA: Diagnosis not present

## 2018-01-13 DIAGNOSIS — E785 Hyperlipidemia, unspecified: Secondary | ICD-10-CM | POA: Diagnosis not present

## 2018-01-13 DIAGNOSIS — Z66 Do not resuscitate: Secondary | ICD-10-CM | POA: Diagnosis present

## 2018-01-13 DIAGNOSIS — I4891 Unspecified atrial fibrillation: Secondary | ICD-10-CM | POA: Diagnosis not present

## 2018-01-13 DIAGNOSIS — Z96652 Presence of left artificial knee joint: Secondary | ICD-10-CM | POA: Diagnosis present

## 2018-01-13 DIAGNOSIS — I255 Ischemic cardiomyopathy: Secondary | ICD-10-CM | POA: Diagnosis present

## 2018-01-13 DIAGNOSIS — J45909 Unspecified asthma, uncomplicated: Secondary | ICD-10-CM | POA: Diagnosis not present

## 2018-01-13 DIAGNOSIS — Z79899 Other long term (current) drug therapy: Secondary | ICD-10-CM

## 2018-01-13 DIAGNOSIS — I2510 Atherosclerotic heart disease of native coronary artery without angina pectoris: Secondary | ICD-10-CM | POA: Diagnosis not present

## 2018-01-13 DIAGNOSIS — R002 Palpitations: Secondary | ICD-10-CM

## 2018-01-13 DIAGNOSIS — I5022 Chronic systolic (congestive) heart failure: Secondary | ICD-10-CM | POA: Diagnosis not present

## 2018-01-13 DIAGNOSIS — Z91041 Radiographic dye allergy status: Secondary | ICD-10-CM | POA: Diagnosis not present

## 2018-01-13 DIAGNOSIS — Z7982 Long term (current) use of aspirin: Secondary | ICD-10-CM

## 2018-01-13 DIAGNOSIS — I252 Old myocardial infarction: Secondary | ICD-10-CM

## 2018-01-13 DIAGNOSIS — I25118 Atherosclerotic heart disease of native coronary artery with other forms of angina pectoris: Secondary | ICD-10-CM

## 2018-01-13 DIAGNOSIS — Z853 Personal history of malignant neoplasm of breast: Secondary | ICD-10-CM

## 2018-01-13 DIAGNOSIS — Z951 Presence of aortocoronary bypass graft: Secondary | ICD-10-CM | POA: Diagnosis not present

## 2018-01-13 DIAGNOSIS — Z885 Allergy status to narcotic agent status: Secondary | ICD-10-CM | POA: Diagnosis not present

## 2018-01-13 DIAGNOSIS — I5023 Acute on chronic systolic (congestive) heart failure: Secondary | ICD-10-CM | POA: Diagnosis not present

## 2018-01-13 DIAGNOSIS — I272 Pulmonary hypertension, unspecified: Secondary | ICD-10-CM | POA: Diagnosis not present

## 2018-01-13 DIAGNOSIS — I25110 Atherosclerotic heart disease of native coronary artery with unstable angina pectoris: Secondary | ICD-10-CM | POA: Diagnosis present

## 2018-01-13 DIAGNOSIS — I493 Ventricular premature depolarization: Secondary | ICD-10-CM | POA: Diagnosis present

## 2018-01-13 DIAGNOSIS — R0602 Shortness of breath: Secondary | ICD-10-CM | POA: Diagnosis not present

## 2018-01-13 DIAGNOSIS — I110 Hypertensive heart disease with heart failure: Secondary | ICD-10-CM | POA: Diagnosis present

## 2018-01-13 DIAGNOSIS — Z882 Allergy status to sulfonamides status: Secondary | ICD-10-CM

## 2018-01-13 DIAGNOSIS — I200 Unstable angina: Secondary | ICD-10-CM | POA: Diagnosis not present

## 2018-01-13 DIAGNOSIS — I480 Paroxysmal atrial fibrillation: Secondary | ICD-10-CM | POA: Diagnosis not present

## 2018-01-13 DIAGNOSIS — M069 Rheumatoid arthritis, unspecified: Secondary | ICD-10-CM | POA: Diagnosis present

## 2018-01-13 DIAGNOSIS — I340 Nonrheumatic mitral (valve) insufficiency: Secondary | ICD-10-CM | POA: Diagnosis not present

## 2018-01-13 DIAGNOSIS — Z96642 Presence of left artificial hip joint: Secondary | ICD-10-CM | POA: Diagnosis present

## 2018-01-13 DIAGNOSIS — I700 Atherosclerosis of aorta: Secondary | ICD-10-CM | POA: Diagnosis not present

## 2018-01-13 DIAGNOSIS — Z923 Personal history of irradiation: Secondary | ICD-10-CM | POA: Diagnosis not present

## 2018-01-13 HISTORY — DX: Pulmonary hypertension, unspecified: I27.20

## 2018-01-13 LAB — TROPONIN I: Troponin I: <0.03 ng/mL (ref <0.03)

## 2018-01-13 LAB — CBC
HCT: 49 % — ABNORMAL HIGH (ref 35.0–47.0)
Hemoglobin: 15.9 g/dL (ref 12.0–16.0)
MCH: 29.6 pg (ref 26.0–34.0)
MCHC: 32.4 g/dL (ref 32.0–36.0)
MCV: 91.1 fL (ref 80.0–100.0)
Platelets: 222 10*3/uL (ref 150–440)
RBC: 5.38 MIL/uL — ABNORMAL HIGH (ref 3.80–5.20)
RDW: 15.4 % — ABNORMAL HIGH (ref 11.5–14.5)
WBC: 6.6 10*3/uL (ref 3.6–11.0)

## 2018-01-13 LAB — PROTIME-INR
INR: 0.99
Prothrombin Time: 13 seconds (ref 11.4–15.2)

## 2018-01-13 LAB — BASIC METABOLIC PANEL
Anion gap: 7 (ref 5–15)
BUN: 12 mg/dL (ref 6–20)
CO2: 26 mmol/L (ref 22–32)
Calcium: 9.3 mg/dL (ref 8.9–10.3)
Chloride: 109 mmol/L (ref 101–111)
Creatinine, Ser: 0.7 mg/dL (ref 0.44–1.00)
GFR calc Af Amer: >60 mL/min (ref >60)
GFR calc non Af Amer: >60 mL/min (ref >60)
Glucose, Bld: 99 mg/dL (ref 65–99)
Potassium: 4.1 mmol/L (ref 3.5–5.1)
Sodium: 142 mmol/L (ref 135–145)

## 2018-01-13 LAB — T4, FREE: Free T4: 0.93 ng/dL (ref 0.61–1.12)

## 2018-01-13 LAB — APTT: aPTT: 32 seconds (ref 24–36)

## 2018-01-13 LAB — HEPARIN LEVEL (UNFRACTIONATED)
Heparin Unfractionated: <0.1 IU/mL — ABNORMAL LOW (ref 0.30–0.70)
Heparin Unfractionated: 0.42 IU/mL (ref 0.30–0.70)

## 2018-01-13 LAB — MAGNESIUM: Magnesium: 1.9 mg/dL (ref 1.7–2.4)

## 2018-01-13 LAB — TSH: TSH: 1.468 u[IU]/mL (ref 0.350–4.500)

## 2018-01-13 MED ORDER — SODIUM CHLORIDE 0.9% FLUSH
3.0000 mL | Freq: Two times a day (BID) | INTRAVENOUS | Status: DC
  Administered 2018-01-13: 3 mL via INTRAVENOUS

## 2018-01-13 MED ORDER — ONDANSETRON HCL 4 MG PO TABS: 4.0000 mg | ORAL_TABLET | Freq: Four times a day (QID) | ORAL | Status: DC | PRN

## 2018-01-13 MED ORDER — ROSUVASTATIN CALCIUM 5 MG PO TABS
5.0000 mg | ORAL_TABLET | Freq: Every day | ORAL | Status: DC
  Administered 2018-01-13 – 2018-01-15 (×3): 5 mg via ORAL
  Filled 2018-01-13 (×3): qty 1

## 2018-01-13 MED ORDER — ONDANSETRON HCL 4 MG/2ML IJ SOLN: 4.0000 mg | Freq: Four times a day (QID) | INTRAMUSCULAR | Status: DC | PRN

## 2018-01-13 MED ORDER — HEPARIN BOLUS VIA INFUSION
4000.0000 [IU] | Freq: Once | INTRAVENOUS | Status: AC
  Administered 2018-01-13: 4000 [IU] via INTRAVENOUS
  Filled 2018-01-13: qty 4000

## 2018-01-13 MED ORDER — SENNOSIDES-DOCUSATE SODIUM 8.6-50 MG PO TABS: 1.0000 | ORAL_TABLET | Freq: Every evening | ORAL | Status: DC | PRN

## 2018-01-13 MED ORDER — ACETAMINOPHEN 325 MG PO TABS
650.0000 mg | ORAL_TABLET | Freq: Four times a day (QID) | ORAL | Status: DC | PRN
  Administered 2018-01-13: 650 mg via ORAL
  Filled 2018-01-13 (×4): qty 2

## 2018-01-13 MED ORDER — ARTIFICIAL TEARS OPHTHALMIC OINT
1.0000 "application " | TOPICAL_OINTMENT | Freq: Every day | OPHTHALMIC | Status: DC
  Administered 2018-01-14: 1 via OPHTHALMIC
  Filled 2018-01-13: qty 3.5

## 2018-01-13 MED ORDER — PREDNISONE 50 MG PO TABS
50.0000 mg | ORAL_TABLET | Freq: Four times a day (QID) | ORAL | Status: AC
  Administered 2018-01-13 – 2018-01-14 (×3): 50 mg via ORAL
  Filled 2018-01-13 (×3): qty 1

## 2018-01-13 MED ORDER — DIPHENHYDRAMINE HCL 25 MG PO CAPS: 50.0000 mg | ORAL_CAPSULE | Freq: Once | ORAL | Status: AC

## 2018-01-13 MED ORDER — HEPARIN (PORCINE) IN NACL 100-0.45 UNIT/ML-% IJ SOLN
1000.0000 [IU]/h | INTRAMUSCULAR | Status: DC
Start: 2018-01-13 — End: 2018-01-14
  Administered 2018-01-13: 1000 [IU]/h via INTRAVENOUS
  Filled 2018-01-13: qty 250

## 2018-01-13 MED ORDER — SODIUM CHLORIDE 0.9% FLUSH: 3.0000 mL | INTRAVENOUS | Status: DC | PRN

## 2018-01-13 MED ORDER — ASPIRIN EC 81 MG PO TBEC
81.0000 mg | DELAYED_RELEASE_TABLET | Freq: Every day | ORAL | Status: DC
  Administered 2018-01-13 – 2018-01-14 (×2): 81 mg via ORAL
  Filled 2018-01-13 (×2): qty 1

## 2018-01-13 MED ORDER — ADULT MULTIVITAMIN LIQUID CH
15.0000 mL | Freq: Every day | ORAL | Status: DC
  Administered 2018-01-15: 15 mL via ORAL
  Filled 2018-01-13 (×3): qty 15

## 2018-01-13 MED ORDER — SODIUM CHLORIDE 0.9 % IV SOLN
INTRAVENOUS | Status: DC
  Administered 2018-01-14: 06:00:00 via INTRAVENOUS

## 2018-01-13 MED ORDER — DILTIAZEM HCL 25 MG/5ML IV SOLN
10.0000 mg | Freq: Once | INTRAVENOUS | Status: AC
  Administered 2018-01-13: 10 mg via INTRAVENOUS
  Filled 2018-01-13: qty 5

## 2018-01-13 MED ORDER — MONTELUKAST SODIUM 10 MG PO TABS: 10.0000 mg | ORAL_TABLET | Freq: Every day | ORAL | Status: DC | PRN

## 2018-01-13 MED ORDER — ASPIRIN 81 MG PO CHEW
81.0000 mg | CHEWABLE_TABLET | ORAL | Status: AC
  Administered 2018-01-14: 81 mg via ORAL
  Filled 2018-01-13: qty 1

## 2018-01-13 MED ORDER — APREMILAST 30 MG PO TABS
30.0000 mg | ORAL_TABLET | Freq: Two times a day (BID) | ORAL | Status: DC
  Administered 2018-01-13 – 2018-01-15 (×4): 30 mg via ORAL
  Filled 2018-01-13 (×3): qty 1

## 2018-01-13 MED ORDER — ACETAMINOPHEN 650 MG RE SUPP
650.0000 mg | Freq: Four times a day (QID) | RECTAL | Status: DC | PRN
  Filled 2018-01-13: qty 1

## 2018-01-13 MED ORDER — METOPROLOL SUCCINATE ER 25 MG PO TB24
25.0000 mg | ORAL_TABLET | Freq: Every day | ORAL | Status: DC
  Administered 2018-01-13: 25 mg via ORAL
  Filled 2018-01-13: qty 1

## 2018-01-13 MED ORDER — DIPHENHYDRAMINE HCL 25 MG PO CAPS
25.0000 mg | ORAL_CAPSULE | Freq: Four times a day (QID) | ORAL | Status: DC | PRN
  Administered 2018-01-13 – 2018-01-15 (×3): 25 mg via ORAL
  Filled 2018-01-13 (×3): qty 1

## 2018-01-13 MED ORDER — BISACODYL 10 MG RE SUPP: 10.0000 mg | Freq: Every day | RECTAL | Status: DC | PRN

## 2018-01-13 MED ORDER — ACETAMINOPHEN 325 MG PO TABS
650.0000 mg | ORAL_TABLET | Freq: Four times a day (QID) | ORAL | Status: DC | PRN
  Administered 2018-01-14: 650 mg via ORAL
  Filled 2018-01-13: qty 2

## 2018-01-13 MED ORDER — DIPHENHYDRAMINE HCL 50 MG/ML IJ SOLN
50.0000 mg | Freq: Once | INTRAMUSCULAR | Status: AC
  Administered 2018-01-14: 50 mg via INTRAVENOUS
  Filled 2018-01-13: qty 1

## 2018-01-13 MED ORDER — MECLIZINE HCL 25 MG PO TABS
25.0000 mg | ORAL_TABLET | Freq: Three times a day (TID) | ORAL | Status: DC | PRN
  Filled 2018-01-13: qty 1

## 2018-01-13 MED ORDER — SODIUM CHLORIDE 0.9 % IV SOLN: 250.0000 mL | INTRAVENOUS | Status: DC | PRN

## 2018-01-13 NOTE — H&P (Signed)
[facility] NAME: Teresa [name]    MR#:  [id number]  DATE OF BIRTH:  [birth year]  DATE OF ADMISSION:  01/13/2018  PRIMARY CARE PHYSICIAN: Jerrol Banana., MD   REQUESTING/REFERRING PHYSICIAN:  Dr Kerman Passey  CHIEF COMPLAINT:   SOB and palpitations HISTORY OF PRESENT ILLNESS:  Teresa [name]  is a 77 y.o. female with a known history of CAD, asthma and chronic systolic heart failure ejection fraction 35% who presents with shortness of breath and palpitations . Patient was seen in clinic this morning and was supposed to have a Holter monitor placed. She was found to have new onset atrial fibrillation. She is scheduled for cardiac catheterization on Monday by Dr. Fletcher Anon for worsening angina. Cardiology has been consulted and has seen the patient.  He was given diltiazem in the emergency room and heart rate is controlled. She has been started on heparin drip as per cardiology recommendations.  She is currently denying chest pain, palpitations and shortness of breath.  PAST MEDICAL HISTORY:   Past Medical History:  Diagnosis Date  . Arthritis   . Asthma   . Back pain   . Breast cancer ([place]) 2009   left breast   . CAD (coronary artery disease)    a. 1994 s/p CABG; b. 03/2015 MV: No ischemia.  . Dental crowns present    caps- left back top, right back bottom  . Gastroesophageal reflux disease   . Hypertension   . Ischemic cardiomyopathy    a. 2013 EF 40%;  b. 03/2015 Echo: EF 55-60%, mild MR, mod dil LA, nl RV fxn.  . MI (myocardial infarction) ([place]) 1994  . Personal history of radiation therapy   . Psoriasis   . PSVT (paroxysmal supraventricular tachycardia) ([place])    a. 02/2015 Holter: short runs of SVT and NSVT.  Marland Kitchen PVC's (premature ventricular contractions)   . Rheumatoid arthritis (HCC)    feet, hands  . Vertigo    approx 2x/yr    PAST SURGICAL HISTORY:   Past Surgical History:  Procedure Laterality Date  .  ABDOMINAL HYSTERECTOMY    . back fusion    . BREAST BIOPSY Right    1991 negative  . BREAST EXCISIONAL BIOPSY Left    2009 positive  . BREAST LUMPECTOMY    . CARDIAC CATHETERIZATION     [place]    . CORONARY ARTERY BYPASS GRAFT  1994   1 vessel - [facility]  . FOOT ARTHRODESIS Right 07/24/2016   Procedure: FUSION FIRST METATARSAL CUNEIFORM JOINT RIGHT FOOT, FUSION SECOND METATARSAL CUNEFORM JOINT BUNION REPAIR RIGHT FOOT;  Surgeon: Albertine Patricia, DPM;  Location: [facility];  Service: Podiatry;  Laterality: Right;  . FOOT FRACTURE SURGERY    . HARDWARE REMOVAL Right 07/24/2016   Procedure: REMOVAL HARDWARE LATERAL MALLEOUS RIGHT ANKLE;  Surgeon: Albertine Patricia, DPM;  Location: [facility];  Service: Podiatry;  Laterality: Right;  REMOVAL OF PIN WHICH WAS INTACT  . HERNIA REPAIR    . TOTAL HIP ARTHROPLASTY Left   . TOTAL KNEE ARTHROPLASTY Left     SOCIAL HISTORY:   Social History   Tobacco Use  . Smoking status: Former Smoker    Packs/day: 0.50    Years: 35.00    Pack years: 17.50    Types: Cigarettes  . Smokeless tobacco: Former Systems developer    Quit date: 12/22/1978  . Tobacco comment: Quit  approx 1990  Substance Use Topics  . Alcohol use: No    FAMILY HISTORY:   Family History  Problem Relation Age of Onset  . Heart attack Father   . Congestive Heart Failure Mother   . Breast cancer Mother 3  . Kidney failure Sister   . Prostate cancer Brother   . Bladder Cancer Brother   . Lung cancer Sister   . Arthritis Sister   . Breast cancer Sister   . Arthritis Sister   . Breast cancer Sister   . Lung cancer Sister   . Lung cancer Brother   . Arthritis Brother     DRUG ALLERGIES:   Allergies  Allergen Reactions  . Sulfa Antibiotics Hives  . Ivp Dye [Iodinated Diagnostic Agents] Swelling  . Codeine Other (See Comments)    ALTERED MENTAL STATUS    REVIEW OF SYSTEMS:   Review of Systems  Constitutional:  Negative.  Negative for chills, fever and malaise/fatigue.  HENT: Negative.  Negative for ear discharge, ear pain, hearing loss, nosebleeds and sore throat.   Eyes: Negative.  Negative for blurred vision and pain.  Respiratory: Positive for shortness of breath. Negative for cough, hemoptysis and wheezing.   Cardiovascular: Positive for palpitations. Negative for chest pain and leg swelling.  Gastrointestinal: Negative.  Negative for abdominal pain, blood in stool, diarrhea, nausea and vomiting.  Genitourinary: Negative.  Negative for dysuria.  Musculoskeletal: Negative.  Negative for back pain.  Skin: Negative.   Neurological: Negative for dizziness, tremors, speech change, focal weakness, seizures and headaches.  Endo/Heme/Allergies: Negative.  Does not bruise/bleed easily.  Psychiatric/Behavioral: Negative.  Negative for depression, hallucinations and suicidal ideas.    MEDICATIONS AT HOME:   Prior to Admission medications   Medication Sig Start Date End Date Taking? Authorizing Provider  Apremilast (OTEZLA) 30 MG TABS Take 30 mg by mouth 2 (two) times daily.   Yes [provider]  aspirin EC 81 MG tablet Take 1 tablet (81 mg total) by mouth daily. 03/03/16  Yes Wellington Hampshire, MD  budesonide-formoterol (SYMBICORT) 160-4.5 MCG/ACT inhaler Inhale 2 puffs into the lungs 2 (two) times daily. Patient taking differently: Inhale 2 puffs into the lungs 2 (two) times daily as needed (for shortness of breath).  10/06/16  Yes Jerrol Banana., MD  metoprolol succinate (TOPROL-XL) 25 MG 24 hr tablet TAKE ONE TABLET BY MOUTH ONCE DAILY Patient taking differently: TAKE 25 MG BY MOUTH ONCE DAILY 09/21/17  Yes Jerrol Banana., MD  Multiple Vitamins-Minerals (MULTIVITAMIN PO) Take 1 tablet by mouth daily.   Yes [provider]  Polyethyl Glycol-Propyl Glycol (SYSTANE OP) Place 2 drops into both eyes daily.   Yes [provider]  rosuvastatin (CRESTOR) 5 MG tablet  Take 1 tablet (5 mg total) by mouth daily. 08/25/17 01/13/18 Yes Wellington Hampshire, MD  acetaminophen (TYLENOL) 325 MG tablet Take 650 mg by mouth every 6 (six) hours as needed for moderate pain or headache.    [provider]  amoxicillin (AMOXIL) 500 MG capsule Take 1 capsule (500 mg total) by mouth See admin instructions. 4 PO 1 hour prior to procedure Patient taking differently: Take 500 mg by mouth See admin instructions. Take 2000 mg by mouth 1 hour prior to dental appointment 04/09/17   Jerrol Banana., MD  diphenhydrAMINE (BENADRYL) 25 MG tablet Take 25 mg by mouth every 6 (six) hours as needed for allergies.     [provider]  diphenhydrAMINE (BENADRYL) 50  MG tablet Take one tablet in the morning on January 28 before procedure. Patient not taking: Reported on 01/13/2018 01/08/18   Wellington Hampshire, MD  meclizine (ANTIVERT) 25 MG tablet Take 25 mg by mouth 3 (three) times daily as needed for dizziness.  04/03/15   [provider]  meloxicam (MOBIC) 7.5 MG tablet Take 1 tablet (7.5 mg total) by mouth daily. Patient taking differently: Take 7.5 mg by mouth daily as needed for pain.  11/18/17   Jerrol Banana., MD  montelukast (SINGULAIR) 10 MG tablet Take 10 mg by mouth daily as needed (for allergies).  04/02/15   [provider]  predniSONE (DELTASONE) 50 MG tablet Take one tablet January 27 at 5pm and 11pm and one tablet January 28 in the morning before procedure. Patient not taking: Reported on 01/13/2018 01/08/18   Wellington Hampshire, MD      VITAL SIGNS:  Blood pressure 118/78, pulse (!) 104, temperature 98 F (36.7 C), temperature source Oral, resp. rate 18, height 5\' 3"  (1.6 m), weight 81.6 kg (180 lb), SpO2 96 %.  PHYSICAL EXAMINATION:   Physical Exam  Constitutional: She is oriented to person, place, and time and well-developed, well-nourished, and in no distress. No distress.  HENT:  Head: Normocephalic.  Eyes: No scleral icterus.   Neck: Normal range of motion. Neck supple. No JVD present. No tracheal deviation present.  Cardiovascular: Normal rate. Exam reveals no gallop and no friction rub.  Murmur heard. Irregular, irregular  Pulmonary/Chest: Effort normal and breath sounds normal. No respiratory distress. She has no wheezes. She has no rales. She exhibits no tenderness.  Abdominal: Soft. Bowel sounds are normal. She exhibits no distension and no mass. There is no tenderness. There is no rebound and no guarding.  Musculoskeletal: Normal range of motion. She exhibits no edema.  Neurological: She is alert and oriented to person, place, and time.  Skin: Skin is warm. No rash noted. No erythema.  Psychiatric: Affect and judgment normal.      LABORATORY PANEL:   CBC Recent Labs  Lab 01/13/18 1140  WBC 6.6  HGB 15.9  HCT 49.0*  PLT 222   ------------------------------------------------------------------------------------------------------------------  Chemistries  Recent Labs  Lab 01/13/18 1140  NA 142  K 4.1  CL 109  CO2 26  GLUCOSE 99  BUN 12  CREATININE 0.70  CALCIUM 9.3   ------------------------------------------------------------------------------------------------------------------  Cardiac Enzymes Recent Labs  Lab 01/13/18 1140  TROPONINI <0.03   ------------------------------------------------------------------------------------------------------------------  RADIOLOGY:  Dg Chest 2 View  Result Date: 01/13/2018 CLINICAL DATA:  New onset atrial fibrillation last night. History of hypertension, coronary artery disease and previous MI, asthma, former smoker. EXAM: CHEST  2 VIEW COMPARISON:  Chest x-ray of January 11, 2018 FINDINGS: The lungs are adequately inflated. There is no focal infiltrate. The interstitial markings are coarse though stable. The patient has undergone previous median sternotomy. The heart is top-normal in size. The pulmonary vascularity is normal. There is  tortuosity of the descending thoracic aorta and there is mural calcification. There is no significant pleural effusion. There is multilevel degenerative disc disease of the thoracic spine. The patient has undergone posterior fusion in the lumbar spine. IMPRESSION: Chronic bronchitic-smoking related changes, stable. No pulmonary edema or pneumonia. Thoracic aortic atherosclerosis.  Previous CABG. Electronically Signed   By: David  Martinique M.D.   On: 01/13/2018 11:19    EKG:   Atrial fibrillation heart rate 143 no ST elevation or depression  IMPRESSION AND PLAN:   77 year old  female with chronic systolic Ejection fraction 35% and essential hypertension who presents with new onset atrial fibrillation.  1. New onset atrial fibrillation: Continue heparin drip and metoprolol Follow up on cardiology recommendations Follow telemetry  2. CAD with unstable angina: Plan for cardiac catheterization tomorrow  Continue heparin drip, aspirin, metoprolol, statin  3. Chronic systolic heart failure without signs of exacerbation: She may benefit from Lasix upon discharge.  4. Hyperlipidemia: Continue statin  All the records are reviewed and case discussed with ED provider. Management plans discussed with the patient and she is in agreement  CODE STATUS: dnr TOTAL TIME TAKING CARE OF THIS PATIENT: 45 minutes.    Lin Glazier M.D on 01/13/2018 at 1:24 PM  Between 7am to 6pm - Pager - [phone]  After 6pm go to [email or web address] - password [email or web address] [facility]  Office  [phone]  CC: Primary care physician; Jerrol Banana., MD

## 2018-01-13 NOTE — ED Provider Notes (Signed)
[facility] Emergency Department Provider Note       Time seen: ----------------------------------------- 10:58 AM on 01/13/2018 -----------------------------------------   I have reviewed the triage vital signs and the nursing notes.  HISTORY   Chief Complaint Atrial Fibrillation    HPI Teresa [name] is a 77 y.o. female with a history of arthritis, asthma, breast cancer, coronary artery disease, ischemic cardiomyopathy, MI, SVT who presents to the ED for feeling like her heart was racing.  Patient was seen at [facility] this morning and was supposed to have a Holter monitor placed.  She is scheduled for cardiac catheterization on Monday by Dr. Fletcher Anon.  Patient denies pain but reports feeling of palpitations that started last night.  Patient states she thinks she should have come in last night.  Past Medical History:  Diagnosis Date  . Arthritis   . Asthma   . Back pain   . Breast cancer ([place]) 2009   left breast   . CAD (coronary artery disease)    a. 1994 s/p CABG; b. 03/2015 MV: No ischemia.  . Dental crowns present    caps- left back top, right back bottom  . Gastroesophageal reflux disease   . Hypertension   . Ischemic cardiomyopathy    a. 2013 EF 40%;  b. 03/2015 Echo: EF 55-60%, mild MR, mod dil LA, nl RV fxn.  . MI (myocardial infarction) ([place]) 1994  . Personal history of radiation therapy   . Psoriasis   . PSVT (paroxysmal supraventricular tachycardia) ([place])    a. 02/2015 Holter: short runs of SVT and NSVT.  Marland Kitchen PVC's (premature ventricular contractions)   . Rheumatoid arthritis (HCC)    feet, hands  . Vertigo    approx 2x/yr    Patient Active Problem List   Diagnosis Date Noted  . Asthma 05/09/2017  . 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 Arthralgia of both hands 01/31/2016  . Ventricular premature depolarization 09/11/2015  . Psoriasis 07/16/2015  . Status post total left knee replacement  06/10/2015  . Hyperlipidemia 03/29/2015  . Pre-syncope 03/04/2015  . CAD (coronary artery disease)   . PVC's (premature ventricular contractions)   . Ischemic cardiomyopathy   . DDD (degenerative disc disease), lumbar 01/18/2015  . Intervertebral disc disorder with radiculopathy of lumbar region 12/26/2014  . L-S radiculopathy 07/03/2014  . Breast CA ([place]) 06/28/2014  . Arthritis, degenerative 06/28/2014  . Acid reflux 03/08/2014  . 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    Past Surgical History:  Procedure Laterality Date  . ABDOMINAL HYSTERECTOMY    . back fusion    . BREAST BIOPSY Right    1991 negative  . BREAST EXCISIONAL BIOPSY Left    2009 positive  . BREAST LUMPECTOMY    . CARDIAC CATHETERIZATION     [place]    . CORONARY ARTERY BYPASS GRAFT  1994   1 vessel - [facility]  . FOOT ARTHRODESIS Right 07/24/2016   Procedure: FUSION FIRST METATARSAL CUNEIFORM JOINT RIGHT FOOT, FUSION SECOND METATARSAL CUNEFORM JOINT BUNION REPAIR RIGHT FOOT;  Surgeon: Albertine Patricia, DPM;  Location: [facility];  Service: Podiatry;  Laterality: Right;  . FOOT FRACTURE SURGERY    . HARDWARE REMOVAL Right 07/24/2016   Procedure: REMOVAL HARDWARE LATERAL MALLEOUS RIGHT ANKLE;  Surgeon: Albertine Patricia, DPM;  Location: [facility];  Service: Podiatry;  Laterality: Right;  REMOVAL OF PIN WHICH WAS INTACT  .  HERNIA REPAIR    . TOTAL HIP ARTHROPLASTY Left   . TOTAL KNEE ARTHROPLASTY Left     Allergies Sulfa antibiotics; Ivp dye [iodinated diagnostic agents]; and Codeine  Social History Social History   Tobacco Use  . Smoking status: Former Smoker    Packs/day: 0.50    Years: 35.00    Pack years: 17.50    Types: Cigarettes  . Smokeless tobacco: Former Systems developer    Quit date: 12/22/1978  . Tobacco comment: Quit approx 1990  Substance Use Topics  . Alcohol use: No  . Drug use: No    Review of Systems Constitutional:  Negative for fever. Cardiovascular: Negative for chest pain.  Positive for palpitations Respiratory: Positive for mild shortness of breath Gastrointestinal: Negative for abdominal pain, vomiting and diarrhea. Genitourinary: Negative for dysuria. Musculoskeletal: Negative for back pain. Skin: Negative for rash. Neurological: Negative for headaches, focal weakness or numbness.  All systems negative/normal/unremarkable except as stated in the HPI  ____________________________________________   PHYSICAL EXAM:  VITAL SIGNS: ED Triage Vitals  Enc Vitals Group     BP 01/13/18 1042 (!) 128/102     Pulse Rate 01/13/18 1042 (!) 146     Resp 01/13/18 1042 16     Temp 01/13/18 1042 98 F (36.7 C)     Temp Source 01/13/18 1042 Oral     SpO2 01/13/18 1042 97 %     Weight 01/13/18 1040 180 lb (81.6 kg)     Height 01/13/18 1040 5\' 3"  (1.6 m)     Head Circumference --      Peak Flow --      Pain Score --      Pain Loc --      Pain Edu? --      Excl. in [place]? --     Constitutional: Alert and oriented. Well appearing and in no distress. Eyes: Conjunctivae are normal. Normal extraocular movements. ENT   Head: Normocephalic and atraumatic.   Nose: No congestion/rhinnorhea.   Mouth/Throat: Mucous membranes are moist.   Neck: No stridor. Cardiovascular: rapid rate, irregular rhythm. No murmurs, rubs, or gallops. Respiratory: Normal respiratory effort without tachypnea nor retractions. Breath sounds are clear and equal bilaterally. No wheezes/rales/rhonchi. Gastrointestinal: Soft and nontender. Normal bowel sounds Musculoskeletal: Nontender with normal range of motion in extremities. No lower extremity tenderness nor edema. Neurologic:  Normal speech and language. No gross focal neurologic deficits are appreciated.  Skin:  Skin is warm, dry and intact. No rash noted. Psychiatric: Mood and affect are normal. Speech and behavior are normal.   ____________________________________________  EKG: Interpreted by me.  Atrial fibrillation with rapid ventricular response, rate is 142 bpm, left axis deviation, left bundle branch block. Repeat EKG interpreted by me, atrial fibrillation with a rate of 91 bpm, wide QRS, likely septal infarct age-indeterminate, left anterior fascicular block ____________________________________________  ED COURSE:  As part of my medical decision making, I reviewed the following data within the [place] History obtained from family if available, nursing notes, old chart and ekg, as well as notes from prior ED visits. Patient presented for palpitations and was found to be in rapid A. fib, we will assess with labs and imaging as indicated at this time.  Patient will be given IV Cardizem.   Procedures ____________________________________________   LABS (pertinent positives/negatives)  Labs Reviewed  CBC - Abnormal; Notable for the following components:      Result Value   RBC 5.38 (*)    HCT 49.0 (*)  RDW 15.4 (*)    All other components within normal limits  BASIC METABOLIC PANEL  TROPONIN I  APTT  TSH  T4, FREE   CRITICAL CARE Performed by: Earleen Newport   Total critical care time: 30 minutes  Critical care time was exclusive of separately billable procedures and treating other patients.  Critical care was necessary to treat or prevent imminent or life-threatening deterioration.  Critical care was time spent personally by me on the following activities: development of treatment plan with patient and/or surrogate as well as nursing, discussions with consultants, evaluation of patient's response to treatment, examination of patient, obtaining history from patient or surrogate, ordering and performing treatments and interventions, ordering and review of laboratory studies, ordering and review of radiographic studies, pulse oximetry and re-evaluation of patient's  condition.  RADIOLOGY Images were viewed by me  Chest x-ray  IMPRESSION: Chronic bronchitic-smoking related changes, stable. No pulmonary edema or pneumonia.  Thoracic aortic atherosclerosis. Previous CABG. ____________________________________________  DIFFERENTIAL DIAGNOSIS   Arrhythmia, electrolyte abnormality, coronary artery disease, PE, hyperthyroidism  FINAL ASSESSMENT AND PLAN  Atrial fibrillation with rapid ventricular response   Plan: Patient had presented for palpitations. Patient's labs were grossly within normal limits. Patient's imaging did not reveal any acute process.  Patient initially received IV Cardizem with good rate control.  I will discussed with cardiology to see if they want to have her admitted to go ahead and do a heart catheterization.   Earleen Newport, MD   Note: This note was generated in part or whole with voice recognition software. Voice recognition is usually quite accurate but there are transcription errors that can and very often do occur. I apologize for any typographical errors that were not detected and corrected.     Earleen Newport, MD 01/13/18 1245

## 2018-01-13 NOTE — ED Notes (Signed)
Cardiologist in to see patient at this time.

## 2018-01-13 NOTE — Progress Notes (Addendum)
ANTICOAGULATION CONSULT NOTE - Initial Consult  Pharmacy Consult for heparin Indication: atrial fibrillation  Allergies  Allergen Reactions  . Sulfa Antibiotics Hives  . Ivp Dye [Iodinated Diagnostic Agents] Swelling  . Codeine Other (See Comments)    ALTERED MENTAL STATUS    Patient Measurements: Height: 5\' 3"  (160 cm) Weight: 173 lb 12.8 oz (78.8 kg) IBW/kg (Calculated) : 52.4 Heparin Dosing Weight: 70.3 kg  Vital Signs: Temp: 97.8 F (36.6 C) (01/23 1449) Temp Source: Oral (01/23 1449) BP: 141/88 (01/23 1449) Pulse Rate: 124 (01/23 1449)  Labs: Recent Labs    01/11/18 1150 01/13/18 1140 01/13/18 1321 01/13/18 2103  HGB 14.6 15.9  --   --   HCT 45.2 49.0*  --   --   PLT 215 222  --   --   APTT  --  32  --   --   LABPROT 13.3 13.0  --   --   INR 1.02 0.99  --   --   HEPARINUNFRC  --   --  <0.10* 0.42  CREATININE 0.52 0.70  --   --   TROPONINI  --  <0.03  --   --     Estimated Creatinine Clearance: 59.5 mL/min (by C-G formula based on SCr of 0.7 mg/dL).   Medical History: Past Medical History:  Diagnosis Date  . Arthritis   . Asthma   . Atrial fibrillation ([place])    a. diagnosed 01/13/2018; b. CHADS2VASc => 6 (CHF, HTN, age x 2, vascular disease, female)  . Back pain   . Breast cancer ([place]) 2009   left breast   . CAD (coronary artery disease)    a. 1994 s/p CABG; b. 03/2015 MV: No ischemia; c. MV 11/18: small fixed apical defect likely secondary to breast attenuation, EF of 42%, frequent PVCs  . Dental crowns present    caps- left back top, right back bottom  . Gastroesophageal reflux disease   . Hypertension   . Ischemic cardiomyopathy    a. 2013 EF 40%;  b. 03/2015 Echo: EF 55-60%, mild MR, mod dil LA, nl RV fxn; c. TTE 12/18: EF of 35-40%, hypokinesis of the anteroseptal, and apical myocardium, normal LV diastolic function parameters, mild to moderate mitral regurgitation, moderately dilated left atrium measuring 48 mm, normal RV systolic function,  moderately elevated pulmonary arterial pressure measuring 53 mmHg  . MI (myocardial infarction) ([place]) 1994  . Personal history of radiation therapy   . Psoriasis   . PSVT (paroxysmal supraventricular tachycardia) ([place])    a. 02/2015 Holter: short runs of SVT and NSVT.  Marland Kitchen Pulmonary hypertension ([place])   . PVC's (premature ventricular contractions)   . Rheumatoid arthritis (HCC)    feet, hands  . Vertigo    approx 2x/yr    Medications:  Infusions:  . sodium chloride    . [START ON 01/14/2018] sodium chloride    . heparin 1,000 Units/hr (01/13/18 1334)    Assessment: 2 yof cc AF with PMH arthritis, asthma, breast cancer, CAD, ICM, MI, SVT. Scheduled for cardiac cath Monday with Dr. Fletcher Anon.   Goal of Therapy:  Heparin level 0.3-0.7 units/ml Monitor platelets by anticoagulation protocol: Yes   Plan:  Give 4000 units bolus x 1 Start heparin infusion at 1000 units/hr Check anti-Xa level in 8 hours and daily while on heparin Continue to monitor H&H and platelets   1/23 2100 heparin level 0.42. Continue current regimen. Recheck heparin level and CBC with tomorrow AM labs.  1/24 AM heparin  level 0.37. Continue current regimen. Recheck heparin level and CBC with tomorrow AM labs.  Roczen Waymire S, Pharm.D., BCPS Clinical Pharmacist 01/13/2018,9:34 PM

## 2018-01-13 NOTE — Consult Note (Signed)
Cardiology Consultation:   Patient ID: [name]; [id number]; [birth year]   Admit date: 01/13/2018 Date of Consult: 01/13/2018  Primary Care Provider: Jerrol Banana., MD Primary Cardiologist: Fletcher Anon   Patient Profile:   Teresa [name] is a 77 y.o. female with a hx of CAD s/p CABG in 1994 with no ischemic events since then, ischemic cardiomyopathy with normalization of EF by TTE in 03/2015 now with newly reduced EF to 30-35% by TTE in 11/2017, pulmonary hypertension, frequent PVCs dating back to 2015, known left bundle branch block, asthma, left-sided breast cancer, back pain, hypertension, psoriasis, PSVT/NSVT, rheumatoid arthritis, and vertigo who is being seen today for the evaluation of new onset Afib with RVR at the request of Dr. Benjie Karvonen.  History of Present Illness:   Ms. Teresa [name] underwent cardiac workup in 02/2015 for dizziness with presyncope included Holter monitoring that showed normal sinus rhythm with short runs of SVT and NSVT. Echocardiogram showed normal LV systolic function with an EF of 55-60%, mild mitral regurgitation, moderately dilated left atrium measuring 49 mm, mild pulmonary hypertension. Stress test at that time showed no evidence of ischemia.   She was seen in the office in 10/2017 for chest pain happening mainly with emotional stress. She underwent a Lexiscan Myoview that showed a small fixed apical defect likely secondary to breast attenuation, EF of 42%, frequent PVCs. Follow-up echocardiogram in 11/2017 confirmed reduced EF. Echocardiogram showed an EF of 35-40%, hypokinesis of the anteroseptal, and apical myocardium, normal LV diastolic function parameters, mild to moderate mitral regurgitation, moderately dilated left atrium measuring 48 mm, normal RV systolic function, moderately elevated pulmonary arterial pressure measuring 53 mmHg. She was most recently seen by Dr. Fletcher Anon on 01/07/2018 and continued to note substernal chest tightness with regular  activity. She also noted increased shortness of breath and fatigue. She continued to have palpitations. EKG at that visit showed normal sinus rhythm with known left bundle branch block and frequent PVCs. Given her symptoms and drop in LV systolic function it was recommended that she proceed with a right and left cardiac catheterization with possible PCI. Given her frequent PVCs Holter monitoring was requested to quantify her PVC burden. She was also referred to electrophysiology for consideration of antiarrhythmic therapy. The drop in her EF was felt to possibly be due to ischemic cardiomyopathy versus PVC burden.   On the evening of 1/22 at approximately 6 PM the patient was sitting down to watch the news when she developed sudden onset of tachypalpitations.  Reported she felt like her heart was shaking her.  Checked her heart rate with her husband's BP cuff and noted her heart rate into the 140s bpm.  She continued to note palpitations throughout the night though eventually went to sleep around 10 PM.  No associated chest pain.  No orthopnea.  No early satiety.  No lower extremity swelling.  Patient presented to our office today to have a Holter monitor applied. She was noted to be tachycardic. Twelve-lead EKG was performed secondary to palpitations and tachycardia which showed new onset A. fib with RVR, 126 bpm, left bundle branch block. Patient was taken to the ED for further evaluation.   Upon her arrival to [facility] ED she remained in A. fib with RVR with heart rates into the 140s bpm.  Blood pressure and oxygen saturations stable.  Reported weight of 180 pounds.  Chest x-ray showed chronic bronchitis related changes without acute process.  Repeat EKG confirmed A. fib with RVR  with heart rate into the 140s, known left bundle branch block.  Initial troponin negative.  White blood cell 6.6, hemoglobin 15.9, platelet 222, TSH 1.468, free T4 0.93, potassium 4.1, serum creatinine 0.70, glucose 99.  She  was given IV diltiazem 10 mg with improvement in her heart rates to the low 100s-90s bpm.  Remains in A. fib.  Cardiology has been asked to further evaluate.    Past Medical History:  Diagnosis Date  . Arthritis   . Asthma   . Atrial fibrillation ([place])    a. diagnosed 01/13/2018; b. CHADS2VASc => 6 (CHF, HTN, age x 2, vascular disease, female)  . Back pain   . Breast cancer ([place]) 2009   left breast   . CAD (coronary artery disease)    a. 1994 s/p CABG; b. 03/2015 MV: No ischemia; c. MV 11/18: small fixed apical defect likely secondary to breast attenuation, EF of 42%, frequent PVCs  . Dental crowns present    caps- left back top, right back bottom  . Gastroesophageal reflux disease   . Hypertension   . Ischemic cardiomyopathy    a. 2013 EF 40%;  b. 03/2015 Echo: EF 55-60%, mild MR, mod dil LA, nl RV fxn; c. TTE 12/18: EF of 35-40%, hypokinesis of the anteroseptal, and apical myocardium, normal LV diastolic function parameters, mild to moderate mitral regurgitation, moderately dilated left atrium measuring 48 mm, normal RV systolic function, moderately elevated pulmonary arterial pressure measuring 53 mmHg  . MI (myocardial infarction) ([place]) 1994  . Personal history of radiation therapy   . Psoriasis   . PSVT (paroxysmal supraventricular tachycardia) ([place])    a. 02/2015 Holter: short runs of SVT and NSVT.  Marland Kitchen Pulmonary hypertension ([place])   . PVC's (premature ventricular contractions)   . Rheumatoid arthritis (HCC)    feet, hands  . Vertigo    approx 2x/yr    Past Surgical History:  Procedure Laterality Date  . ABDOMINAL HYSTERECTOMY    . back fusion    . BREAST BIOPSY Right    1991 negative  . BREAST EXCISIONAL BIOPSY Left    2009 positive  . BREAST LUMPECTOMY    . CARDIAC CATHETERIZATION     [place]    . CORONARY ARTERY BYPASS GRAFT  1994   1 vessel - [facility]  . FOOT ARTHRODESIS Right 07/24/2016   Procedure: FUSION FIRST  METATARSAL CUNEIFORM JOINT RIGHT FOOT, FUSION SECOND METATARSAL CUNEFORM JOINT BUNION REPAIR RIGHT FOOT;  Surgeon: Albertine Patricia, DPM;  Location: [place];  Service: Podiatry;  Laterality: Right;  . FOOT FRACTURE SURGERY    . HARDWARE REMOVAL Right 07/24/2016   Procedure: REMOVAL HARDWARE LATERAL MALLEOUS RIGHT ANKLE;  Surgeon: Albertine Patricia, DPM;  Location: [place];  Service: Podiatry;  Laterality: Right;  REMOVAL OF PIN WHICH WAS INTACT  . HERNIA REPAIR    . TOTAL HIP ARTHROPLASTY Left   . TOTAL KNEE ARTHROPLASTY Left      Home Meds: Prior to Admission medications   Medication Sig Start Date End Date Taking? Authorizing Provider  Apremilast (OTEZLA) 30 MG TABS Take 30 mg by mouth 2 (two) times daily.   Yes [provider]  aspirin EC 81 MG tablet Take 1 tablet (81 mg total) by mouth daily. 03/03/16  Yes Wellington Hampshire, MD  budesonide-formoterol (SYMBICORT) 160-4.5 MCG/ACT inhaler Inhale 2 puffs into the lungs 2 (two) times daily. Patient taking differently: Inhale 2 puffs  into the lungs 2 (two) times daily as needed (for shortness of breath).  10/06/16  Yes Jerrol Banana., MD  metoprolol succinate (TOPROL-XL) 25 MG 24 hr tablet TAKE ONE TABLET BY MOUTH ONCE DAILY Patient taking differently: TAKE 25 MG BY MOUTH ONCE DAILY 09/21/17  Yes Jerrol Banana., MD  Multiple Vitamins-Minerals (MULTIVITAMIN PO) Take 1 tablet by mouth daily.   Yes [provider]  Polyethyl Glycol-Propyl Glycol (SYSTANE OP) Place 2 drops into both eyes daily.   Yes [provider]  rosuvastatin (CRESTOR) 5 MG tablet Take 1 tablet (5 mg total) by mouth daily. 08/25/17 01/13/18 Yes Wellington Hampshire, MD  acetaminophen (TYLENOL) 325 MG tablet Take 650 mg by mouth every 6 (six) hours as needed for moderate pain or headache.    [provider]  amoxicillin (AMOXIL) 500 MG capsule Take 1 capsule (500 mg total) by mouth See admin instructions. 4 PO 1 hour  prior to procedure Patient taking differently: Take 500 mg by mouth See admin instructions. Take 2000 mg by mouth 1 hour prior to dental appointment 04/09/17   Jerrol Banana., MD  diphenhydrAMINE (BENADRYL) 25 MG tablet Take 25 mg by mouth every 6 (six) hours as needed for allergies.     [provider]  diphenhydrAMINE (BENADRYL) 50 MG tablet Take one tablet in the morning on January 28 before procedure. Patient not taking: Reported on 01/13/2018 01/08/18   Wellington Hampshire, MD  meclizine (ANTIVERT) 25 MG tablet Take 25 mg by mouth 3 (three) times daily as needed for dizziness.  04/03/15   [provider]  meloxicam (MOBIC) 7.5 MG tablet Take 1 tablet (7.5 mg total) by mouth daily. Patient taking differently: Take 7.5 mg by mouth daily as needed for pain.  11/18/17   Jerrol Banana., MD  montelukast (SINGULAIR) 10 MG tablet Take 10 mg by mouth daily as needed (for allergies).  04/02/15   [provider]  predniSONE (DELTASONE) 50 MG tablet Take one tablet January 27 at 5pm and 11pm and one tablet January 28 in the morning before procedure. Patient not taking: Reported on 01/13/2018 01/08/18   Wellington Hampshire, MD    Inpatient Medications: Scheduled Meds:  Continuous Infusions: . heparin 1,000 Units/hr (01/13/18 1334)   PRN Meds:   Allergies:   Allergies  Allergen Reactions  . Sulfa Antibiotics Hives  . Ivp Dye [Iodinated Diagnostic Agents] Swelling  . Codeine Other (See Comments)    ALTERED MENTAL STATUS    Social History:   Social History   Socioeconomic History  . Marital status: Divorced    Spouse name: Not on file  . Number of children: Not on file  . Years of education: Not on file  . Highest education level: Not on file  Social Needs  . Financial resource strain: Not on file  . Food insecurity - worry: Not on file  . Food insecurity - inability: Not on file  . Transportation needs - medical: Not on file  . Transportation needs -  non-medical: Not on file  Occupational History  . Not on file  Tobacco Use  . Smoking status: Former Smoker    Packs/day: 0.50    Years: 35.00    Pack years: 17.50    Types: Cigarettes  . Smokeless tobacco: Former Systems developer    Quit date: 12/22/1978  . Tobacco comment: Quit approx 1990  Substance and Sexual Activity  . Alcohol use: No  . Drug use:  No  . Sexual activity: Not on file  Other Topics Concern  . Not on file  Social History Narrative  . Not on file     Family History:   Family History  Problem Relation Age of Onset  . Heart attack Father   . Congestive Heart Failure Mother   . Breast cancer Mother 9  . Kidney failure Sister   . Prostate cancer Brother   . Bladder Cancer Brother   . Lung cancer Sister   . Arthritis Sister   . Breast cancer Sister   . Arthritis Sister   . Breast cancer Sister   . Lung cancer Sister   . Lung cancer Brother   . Arthritis Brother     ROS:  Review of Systems  Constitutional: Positive for malaise/fatigue. Negative for chills, diaphoresis, fever and weight loss.  HENT: Negative for congestion.   Eyes: Negative for discharge and redness.  Respiratory: Positive for shortness of breath. Negative for cough, hemoptysis, sputum production and wheezing.   Cardiovascular: Positive for chest pain, palpitations and leg swelling. Negative for orthopnea, claudication and PND.  Gastrointestinal: Negative for abdominal pain, blood in stool, heartburn, melena, nausea and vomiting.  Genitourinary: Negative for hematuria.  Musculoskeletal: Negative for falls and myalgias.  Skin: Negative for rash.  Neurological: Positive for dizziness and weakness. Negative for tingling, tremors, sensory change, speech change, focal weakness and loss of consciousness.  Endo/Heme/Allergies: Does not bruise/bleed easily.  Psychiatric/Behavioral: Negative for substance abuse. The patient is not nervous/anxious.   All other systems reviewed and are negative.      Physical Exam/Data:   Vitals:   01/13/18 1042 01/13/18 1119 01/13/18 1130 01/13/18 1155  BP: (!) 128/102 (!) 148/103 118/78 118/78  Pulse: (!) 146 (!) 130  (!) 104  Resp: 16  17 18   Temp: 98 F (36.7 C)     TempSrc: Oral     SpO2: 97% 97% 96% 96%  Weight:      Height:       No intake or output data in the 24 hours ending 01/13/18 1341 Filed Weights   01/13/18 1040  Weight: 180 lb (81.6 kg)   Body mass index is 31.89 kg/m.   Physical Exam: General: Well developed, well nourished, in no acute distress. Head: Normocephalic, atraumatic, sclera non-icteric, no xanthomas, nares without discharge.  Neck: Negative for carotid bruits. JVD mildly elevated. Lungs: Clear bilaterally to auscultation without wheezes, rales, or rhonchi. Breathing is unlabored. Heart: Mildly tachycardic, irregularly irregular with S1 S2. I/VI systolic murmur RUSB, no rubs, or gallops appreciated. Abdomen: Soft, non-tender, non-distended with normoactive bowel sounds. No hepatomegaly. No rebound/guarding. No obvious abdominal masses. Msk:  Strength and tone appear normal for age. Extremities: No clubbing or cyanosis. Trace, bilateral pretibial edema. Distal pedal pulses are 2+ and equal bilaterally. Neuro: Alert and oriented X 3. No facial asymmetry. No focal deficit. Moves all extremities spontaneously. Psych:  Responds to questions appropriately with a normal affect.   EKG:  The EKG was personally reviewed and demonstrates: Afib with RVR, 126 bpm, LBBB (old) Telemetry:  Telemetry was personally reviewed and demonstrates: Afib with intermittent RVR with heart rates in the 80s to low 100s bpm, LBBB  Weights: Filed Weights   01/13/18 1040  Weight: 180 lb (81.6 kg)    Relevant CV Studies: Myoview 11/20/2017:  Defect 1: There is a small defect of mild severity present in the apex location. This is likely due to breast attenuation.  This is an intermediate risk  study mainly due to reduced  EF  Nuclear stress EF: 42%.  No convincing evidence of ischemia.  Nondiagnostic EKG due to left bundle branch block.  Frequent PVCs noted.  Echo 12/04/17: Study Conclusions  - Left ventricle: The cavity size was normal. Systolic function was   moderately reduced. The estimated ejection fraction was in the   range of 35% to 40%. Hypokinesis of the anteroseptal myocardium.   Hypokinesis of the apical myocardium. Left ventricular diastolic   function parameters were normal. - Mitral valve: There was mild to moderate regurgitation. - Left atrium: The atrium was moderately dilated. - Right ventricle: Systolic function was normal. - Pulmonary arteries: Systolic pressure was moderately elevated. PA   peak pressure: 53 mm Hg (S).  R/LHC pending.  Laboratory Data:  Chemistry Recent Labs  Lab 01/11/18 1150 01/13/18 1140  NA 141 142  K 4.1 4.1  CL 107 109  CO2 24 26  GLUCOSE 99 99  BUN 18 12  CREATININE 0.52 0.70  CALCIUM 9.3 9.3  GFRNONAA >60 >60  GFRAA >60 >60  ANIONGAP 10 7    No results for input(s): PROT, ALBUMIN, AST, ALT, ALKPHOS, BILITOT in the last 168 hours. Hematology Recent Labs  Lab 01/11/18 1150 01/13/18 1140  WBC 5.6 6.6  RBC 4.95 5.38*  HGB 14.6 15.9  HCT 45.2 49.0*  MCV 91.3 91.1  MCH 29.4 29.6  MCHC 32.2 32.4  RDW 15.1* 15.4*  PLT 215 222   Cardiac Enzymes Recent Labs  Lab 01/13/18 1140  TROPONINI <0.03   No results for input(s): TROPIPOC in the last 168 hours.  BNPNo results for input(s): BNP, PROBNP in the last 168 hours.  DDimer No results for input(s): DDIMER in the last 168 hours.  Radiology/Studies:  Dg Chest 2 View  Result Date: 01/13/2018 IMPRESSION: Chronic bronchitic-smoking related changes, stable. No pulmonary edema or pneumonia. Thoracic aortic atherosclerosis.  Previous CABG. Electronically Signed   By: David  Martinique M.D.   On: 01/13/2018 11:19   Dg Chest 2 View  Result Date: 01/11/2018 IMPRESSION: No acute  cardiopulmonary abnormality. Electronically Signed   By: Genevie Ann M.D.   On: 01/11/2018 13:48     Assessment and Plan:   1.  New onset A. fib with RVR: -Noted in the office on 1/23 -Cannot rule out paroxysms of A. fib potentially playing a role in some of her symptomology -Pursue rate control strategy with metoprolol.  Patient has been started on Toprol-XL 25 mg daily per IM.  It is reasonable to start with long-acting metoprolol at this time given her ventricular rates are reasonably controlled.  If there is difficulty in controlling her ventricular rates moving forward may need to temporarily transition to short acting metoprolol, though ideally in the long run given her cardiomyopathy, would prefer to use Toprol -Start heparin drip per pharmacy -Following her cardiac catheterization and prior to discharge she will need to be transitioned to [place] given her elevated CHADS2VASc of at least 6 (CHF, HTN, H x2, vascular disease, sex category) -Can pursue rhythm control strategy if she does not spontaneously convert with rate control as an outpatient as long as she demonstrates tolerability of A. fib and well-controlled ventricular rates  2.  CAD involving native coronary arteries with worsening angina: -No symptoms concerning for angina at this time -Patient was previously scheduled for outpatient diagnostic right and left cardiac catheterization on 1/28 -Continue to cycle troponin to rule out -Start heparin drip as above -Plan for right and left  cardiac catheterization with Dr. Fletcher Anon on 1/24 -Given patient's contrast allergy she will need to be premedicated.  This has been ordered at the time of her cardiac catheterization orders -Continue aspirin 81 mg daily for now.  Further recommendations of antiplatelet therapy will be made upon cardiac catheterization  3.  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  Pulmonary hypertension: -As above  5.  Frequent PVCs: -Toprol -TSH checked in the ED found to be normal -Check magnesium -Potassium at goal -Continue with plan for outpatient Holter monitoring to quantify PVC burden -Has been referred to EP  6.  Essential hypertension: -Blood pressure currently well controlled -Continue Toprol ordered per IM  7.  Hyperlipidemia: -Tolerating Crestor -Recent lipid panel from 06/2017 with LDL of 80 -Has previously noted myalgias with statins though seems to be tolerating low-dose Crestor -If found to have worsening coronary artery disease on cardiac catheterization consider Zetia versus PCSK-9 inhibitor   For questions or updates, please contact [facility] Please consult [email or web address] for contact info under Cardiology/STEMI.   Signed, Christell Faith, PA-C [place] Pager: [phone] 01/13/2018, 1:41 PM

## 2018-01-13 NOTE — Progress Notes (Signed)
1.) Reason for visit: EKG, c/o palpitations  2.) Name of MD requesting visit: Ignacia Bayley, NP  3.) H&P: SVT, chest pain  4.) ROS related to problem: Patient came in today for Holter Monitor placement. She asked to have her BP and HR checked as she felt like her palpitations were worse than normal. She described that she started having the "racing heart rate" last night with associated jaw pain and shortness of breath. Patient's BP 142/80, HR 124.   5.) Assessment and plan per MD: EKG performed and shown to Ignacia Bayley, NP. Patient in atrial fibrillation. Patient scheduled for heart cath on Monday, 01/18/18. Ignacia Bayley, NP advised that patient should go to ED for evaluation and most likely admission for anticoagulation and potential heart cath while in the hospital.   Patient notified of findings. Patient was tearful and upset with the findings. Plan of care was explained to patient and she verbalized understanding. Patient's 2 nieces were notified by nurse. One niece is coming to the ED to be with the patient.

## 2018-01-13 NOTE — ED Triage Notes (Addendum)
Patient was being seen at [facility] this AM to have heart monitor put on and scheduled for catherization Monday. Jefm Bryant brought patient over for new onset A-fib RVR. A&O x4 this AM. Denies pain but reports feeling palpitations that started last night. HX Left sided breast cancer 2009

## 2018-01-13 NOTE — ED Notes (Signed)
Pt taken floor via  Stretcher. VSS. Afib on monitor. Report called to [facility] on 2A. All questions and concerns answered.

## 2018-01-13 NOTE — Progress Notes (Signed)
ANTICOAGULATION CONSULT NOTE - Initial Consult  Pharmacy Consult for heparin Indication: atrial fibrillation  Allergies  Allergen Reactions  . Sulfa Antibiotics Hives  . Ivp Dye [Iodinated Diagnostic Agents] Swelling  . Codeine Other (See Comments)    ALTERED MENTAL STATUS    Patient Measurements: Height: 5\' 3"  (160 cm) Weight: 180 lb (81.6 kg) IBW/kg (Calculated) : 52.4 Heparin Dosing Weight: 70.3 kg  Vital Signs: Temp: 98 F (36.7 C) (01/23 1042) Temp Source: Oral (01/23 1042) BP: 118/78 (01/23 1155) Pulse Rate: 104 (01/23 1155)  Labs: Recent Labs    01/11/18 1150 01/13/18 1140  HGB 14.6 15.9  HCT 45.2 49.0*  PLT 215 222  APTT  --  32  LABPROT 13.3  --   INR 1.02  --   CREATININE 0.52 0.70  TROPONINI  --  <0.03    Estimated Creatinine Clearance: 60.5 mL/min (by C-G formula based on SCr of 0.7 mg/dL).   Medical History: Past Medical History:  Diagnosis Date  . Arthritis   . Asthma   . Back pain   . Breast cancer ([place]) 2009   left breast   . CAD (coronary artery disease)    a. 1994 s/p CABG; b. 03/2015 MV: No ischemia.  . Dental crowns present    caps- left back top, right back bottom  . Gastroesophageal reflux disease   . Hypertension   . Ischemic cardiomyopathy    a. 2013 EF 40%;  b. 03/2015 Echo: EF 55-60%, mild MR, mod dil LA, nl RV fxn.  . MI (myocardial infarction) ([place]) 1994  . Personal history of radiation therapy   . Psoriasis   . PSVT (paroxysmal supraventricular tachycardia) ([place])    a. 02/2015 Holter: short runs of SVT and NSVT.  Marland Kitchen PVC's (premature ventricular contractions)   . Rheumatoid arthritis (HCC)    feet, hands  . Vertigo    approx 2x/yr    Medications:  Infusions:  . heparin      Assessment: 76 yof cc AF with PMH arthritis, asthma, breast cancer, CAD, ICM, MI, SVT. Scheduled for cardiac cath Monday with Dr. Fletcher Anon.   Goal of Therapy:  Heparin level 0.3-0.7 units/ml Monitor platelets by anticoagulation protocol: Yes    Plan:  Give 4000 units bolus x 1 Start heparin infusion at 1000 units/hr Check anti-Xa level in 8 hours and daily while on heparin Continue to monitor H&H and platelets  Laural Benes, Pharm.D., BCPS Clinical Pharmacist 01/13/2018,12:53 PM

## 2018-01-13 NOTE — ED Notes (Signed)
Hospitalist in room at this time.  

## 2018-01-14 ENCOUNTER — Encounter: Payer: Self-pay | Admitting: Cardiovascular Disease

## 2018-01-14 ENCOUNTER — Encounter
Admission: EM | Disposition: A | Discharge status: Home / Self Care | Payer: Self-pay | Source: Home / Self Care | Attending: Internal Medicine

## 2018-01-14 DIAGNOSIS — I5023 Acute on chronic systolic (congestive) heart failure: Secondary | ICD-10-CM

## 2018-01-14 DIAGNOSIS — I480 Paroxysmal atrial fibrillation: Principal | ICD-10-CM

## 2018-01-14 DIAGNOSIS — I200 Unstable angina: Secondary | ICD-10-CM

## 2018-01-14 HISTORY — PX: RIGHT/LEFT HEART CATH AND CORONARY/GRAFT ANGIOGRAPHY: CATH118267

## 2018-01-14 LAB — HEPARIN LEVEL (UNFRACTIONATED): Heparin Unfractionated: 0.37 IU/mL (ref 0.30–0.70)

## 2018-01-14 LAB — BASIC METABOLIC PANEL
Anion gap: 10 (ref 5–15)
BUN: 16 mg/dL (ref 6–20)
CO2: 20 mmol/L — ABNORMAL LOW (ref 22–32)
Calcium: 9.6 mg/dL (ref 8.9–10.3)
Chloride: 109 mmol/L (ref 101–111)
Creatinine, Ser: 0.53 mg/dL (ref 0.44–1.00)
GFR calc Af Amer: >60 mL/min (ref >60)
GFR calc non Af Amer: >60 mL/min (ref >60)
Glucose, Bld: 160 mg/dL — ABNORMAL HIGH (ref 65–99)
Potassium: 4.6 mmol/L (ref 3.5–5.1)
Sodium: 139 mmol/L (ref 135–145)

## 2018-01-14 LAB — CBC
HCT: 47.1 % — ABNORMAL HIGH (ref 35.0–47.0)
Hemoglobin: 15.5 g/dL (ref 12.0–16.0)
MCH: 29.9 pg (ref 26.0–34.0)
MCHC: 32.8 g/dL (ref 32.0–36.0)
MCV: 91.1 fL (ref 80.0–100.0)
Platelets: 190 10*3/uL (ref 150–440)
RBC: 5.17 MIL/uL (ref 3.80–5.20)
RDW: 14.8 % — ABNORMAL HIGH (ref 11.5–14.5)
WBC: 3.7 10*3/uL (ref 3.6–11.0)

## 2018-01-14 SURGERY — RIGHT/LEFT HEART CATH AND CORONARY/GRAFT ANGIOGRAPHY
Anesthesia: Moderate Sedation

## 2018-01-14 MED ORDER — MIDAZOLAM HCL 2 MG/2ML IJ SOLN
INTRAMUSCULAR | Status: AC
  Filled 2018-01-14: qty 2

## 2018-01-14 MED ORDER — HEPARIN (PORCINE) IN NACL 100-0.45 UNIT/ML-% IJ SOLN
1150.0000 [IU]/h | INTRAMUSCULAR | Status: AC
  Administered 2018-01-14: 1000 [IU]/h via INTRAVENOUS
  Filled 2018-01-14: qty 250

## 2018-01-14 MED ORDER — VERAPAMIL HCL 2.5 MG/ML IV SOLN
INTRAVENOUS | Status: AC
  Filled 2018-01-14: qty 2

## 2018-01-14 MED ORDER — METOPROLOL TARTRATE 5 MG/5ML IV SOLN
INTRAVENOUS | Status: AC
  Filled 2018-01-14: qty 5

## 2018-01-14 MED ORDER — FENTANYL CITRATE (PF) 100 MCG/2ML IJ SOLN
INTRAMUSCULAR | Status: DC | PRN
  Administered 2018-01-14: 25 ug via INTRAVENOUS

## 2018-01-14 MED ORDER — SODIUM CHLORIDE 0.9 % IV SOLN
INTRAVENOUS | Status: DC
  Administered 2018-01-14: 18:00:00 via INTRAVENOUS

## 2018-01-14 MED ORDER — METOPROLOL SUCCINATE ER 50 MG PO TB24
50.0000 mg | ORAL_TABLET | Freq: Every day | ORAL | Status: DC
  Administered 2018-01-14: 50 mg via ORAL
  Filled 2018-01-14: qty 1

## 2018-01-14 MED ORDER — SODIUM CHLORIDE 0.9% FLUSH
3.0000 mL | Freq: Two times a day (BID) | INTRAVENOUS | Status: DC
  Administered 2018-01-14 – 2018-01-15 (×2): 3 mL via INTRAVENOUS

## 2018-01-14 MED ORDER — SODIUM CHLORIDE 0.9 % IV SOLN: 250.0000 mL | INTRAVENOUS | Status: DC | PRN

## 2018-01-14 MED ORDER — METHYLPREDNISOLONE SODIUM SUCC 125 MG IJ SOLR
125.0000 mg | Freq: Once | INTRAMUSCULAR | Status: AC
  Administered 2018-01-14: 125 mg via INTRAVENOUS

## 2018-01-14 MED ORDER — HEPARIN SODIUM (PORCINE) 1000 UNIT/ML IJ SOLN
INTRAMUSCULAR | Status: AC
  Filled 2018-01-14: qty 1

## 2018-01-14 MED ORDER — MIDAZOLAM HCL 2 MG/2ML IJ SOLN
INTRAMUSCULAR | Status: DC | PRN
  Administered 2018-01-14: 1 mg via INTRAVENOUS

## 2018-01-14 MED ORDER — METHYLPREDNISOLONE SODIUM SUCC 125 MG IJ SOLR
INTRAMUSCULAR | Status: AC
  Filled 2018-01-14: qty 2

## 2018-01-14 MED ORDER — SODIUM CHLORIDE 0.9% FLUSH: 3.0000 mL | INTRAVENOUS | Status: DC | PRN

## 2018-01-14 MED ORDER — AMIODARONE HCL 200 MG PO TABS
400.0000 mg | ORAL_TABLET | Freq: Two times a day (BID) | ORAL | Status: DC
  Administered 2018-01-14 – 2018-01-15 (×3): 400 mg via ORAL
  Filled 2018-01-14 (×3): qty 2

## 2018-01-14 MED ORDER — SODIUM CHLORIDE 0.9 % IV SOLN
INTRAVENOUS | Status: AC | PRN
  Administered 2018-01-14: 250 mL via INTRAVENOUS

## 2018-01-14 MED ORDER — FENTANYL CITRATE (PF) 100 MCG/2ML IJ SOLN
INTRAMUSCULAR | Status: AC
  Filled 2018-01-14: qty 2

## 2018-01-14 MED ORDER — SODIUM CHLORIDE 0.9% FLUSH: 3.0000 mL | Freq: Two times a day (BID) | INTRAVENOUS | Status: DC

## 2018-01-14 MED ORDER — ASPIRIN 81 MG PO CHEW: 81.0000 mg | CHEWABLE_TABLET | ORAL | Status: DC

## 2018-01-14 MED ORDER — HEPARIN (PORCINE) IN NACL 2-0.9 UNIT/ML-% IJ SOLN
INTRAMUSCULAR | Status: AC
  Filled 2018-01-14: qty 500

## 2018-01-14 MED ORDER — SODIUM CHLORIDE 0.9 % IV SOLN: INTRAVENOUS | Status: DC

## 2018-01-14 MED ORDER — IOPAMIDOL (ISOVUE-300) INJECTION 61%
INTRAVENOUS | Status: DC | PRN
  Administered 2018-01-14: 50 mL via INTRA_ARTERIAL

## 2018-01-14 SURGICAL SUPPLY — 11 items
CATH BALLN WEDGE 5F 110CM (CATHETERS) IMPLANT
CATH INFINITI 5 FR JL3.5 (CATHETERS) ×2 IMPLANT
CATH INFINITI 5FR JL4 (CATHETERS) ×2 IMPLANT
CATH INFINITI JR4 5F (CATHETERS) ×2 IMPLANT
CATH SWANZ 7F THERMO (CATHETERS) IMPLANT
GLIDESHEATH SLEND SS 6F .021 (SHEATH) ×2 IMPLANT
KIT MANI 3VAL PERCEP (MISCELLANEOUS) ×2 IMPLANT
KIT RIGHT HEART (MISCELLANEOUS) IMPLANT
PACK CARDIAC CATH (CUSTOM PROCEDURE TRAY) ×2 IMPLANT
SHEATH GLIDE SLENDER 4/5FR (SHEATH) IMPLANT
WIRE ROSEN-J .035X260CM (WIRE) ×2 IMPLANT

## 2018-01-14 NOTE — Progress Notes (Signed)
Patient clinically stable post heart cath with Dr Fletcher Anon, vitals stable. Cath done via left brachial artery, to keep arm straight for 6hours. Patient made aware to keep straight. Dr Fletcher Anon out to speak with patient and family with questions answered. To come back tomorrow for TEE/Cardioversion. Still afib per monitor despite meds given.

## 2018-01-14 NOTE — Interval H&P Note (Signed)
History and Physical Interval Note:  01/14/2018 11:00 AM  Teresa [name]  has presented today for surgery, with the diagnosis of unstable angina with an abnormal nuclear stress test  The various methods of treatment have been discussed with the patient and family. After consideration of risks, benefits and other options for treatment, the patient has consented to  Procedure(s): RIGHT/LEFT HEART CATH AND CORONARY/GRAFT ANGIOGRAPHY (N/A) as a surgical intervention .  The patient's history has been reviewed, patient examined, no change in status, stable for surgery.  I have reviewed the patient's chart and labs.  Questions were answered to the patient's satisfaction.     Kathlyn [place]

## 2018-01-14 NOTE — Progress Notes (Signed)
Notified MD of HR continuing to be anywhere from the low 100's up into the 140's. 2200 dose of Amiodarone 400 mg given early. Will continue to monitor and assess.

## 2018-01-14 NOTE — Progress Notes (Signed)
Progress Note  Patient Name: Teresa [name] Date of Encounter: 01/14/2018  Primary Cardiologist: Kathlyn Sacramento, MD  Subjective   No chest pain or sob overnight.  Doesn't notice palpitations as much, though HRs trending 120's to 130's - still in Afib.  For cath today.  Questions answered.  Inpatient Medications    Scheduled Meds: . Apremilast  30 mg Oral BID  . artificial tears  1 application Both Eyes Daily  . [START ON 01/15/2018] aspirin  81 mg Oral Pre-Cath  . aspirin EC  81 mg Oral Daily  . diphenhydrAMINE  50 mg Oral Once   Or  . diphenhydrAMINE  50 mg Intravenous Once  . metoprolol succinate  25 mg Oral Daily  . multivitamin  15 mL Oral Daily  . predniSONE  50 mg Oral Q6H  . rosuvastatin  5 mg Oral Daily  . sodium chloride flush  3 mL Intravenous Q12H  . sodium chloride flush  3 mL Intravenous Q12H   Continuous Infusions: . sodium chloride    . sodium chloride 10 mL/hr at 01/14/18 0602  . sodium chloride    . sodium chloride    . heparin 1,000 Units/hr (01/13/18 1334)   PRN Meds: sodium chloride, sodium chloride, acetaminophen **OR** acetaminophen, acetaminophen, bisacodyl, diphenhydrAMINE, meclizine, montelukast, ondansetron **OR** ondansetron (ZOFRAN) IV, senna-docusate, sodium chloride flush, sodium chloride flush   Vital Signs    Vitals:   01/13/18 1459 01/13/18 2215 01/14/18 0547 01/14/18 0756  BP:  (!) 135/107 115/68 140/77  Pulse:  71 73 (!) 116  Resp:   18   Temp:   (!) 97.5 F (36.4 C)   TempSrc:   Oral   SpO2:  91% 97% 99%  Weight: 173 lb 12.8 oz (78.8 kg)     Height: 5\' 3"  (1.6 m)       Intake/Output Summary (Last 24 hours) at 01/14/2018 0819 Last data filed at 01/14/2018 1610 Gross per 24 hour  Intake 171.67 ml  Output 0 ml  Net 171.67 ml   Filed Weights   01/13/18 1040 01/13/18 1459  Weight: 180 lb (81.6 kg) 173 lb 12.8 oz (78.8 kg)    Physical Exam   GEN: Well nourished, well developed, in no acute distress.  HEENT: Grossly  normal.  Neck: Supple, no JVD, carotid bruits, or masses. Cardiac: IR, IR, distant, no murmurs, rubs, or gallops. No clubbing, cyanosis, edema.  Radials/DP/PT 2+ and equal bilaterally.  Respiratory:  Respirations regular and unlabored, clear to auscultation bilaterally. GI: Soft, nontender, nondistended, BS + x 4. MS: no deformity or atrophy. Skin: warm and dry, no rash. Neuro:  Strength and sensation are intact. Psych: AAOx3.  Normal affect.  Labs    Chemistry Recent Labs  Lab 01/11/18 1150 01/13/18 1140 01/14/18 0513  NA 141 142 139  K 4.1 4.1 4.6  CL 107 109 109  CO2 24 26 20*  GLUCOSE 99 99 160*  BUN 18 12 16   CREATININE 0.52 0.70 0.53  CALCIUM 9.3 9.3 9.6  GFRNONAA >60 >60 >60  GFRAA >60 >60 >60  ANIONGAP 10 7 10      Hematology Recent Labs  Lab 01/11/18 1150 01/13/18 1140 01/14/18 0626  WBC 5.6 6.6 3.7  RBC 4.95 5.38* 5.17  HGB 14.6 15.9 15.5  HCT 45.2 49.0* 47.1*  MCV 91.3 91.1 91.1  MCH 29.4 29.6 29.9  MCHC 32.2 32.4 32.8  RDW 15.1* 15.4* 14.8*  PLT 215 222 190    Cardiac Enzymes Recent Labs  Lab  01/13/18 1140  TROPONINI <0.03     Radiology    Dg Chest 2 View  Result Date: 01/13/2018 CLINICAL DATA:  New onset atrial fibrillation last night. History of hypertension, coronary artery disease and previous MI, asthma, former smoker. EXAM: CHEST  2 VIEW COMPARISON:  Chest x-ray of January 11, 2018 FINDINGS: The lungs are adequately inflated. There is no focal infiltrate. The interstitial markings are coarse though stable. The patient has undergone previous median sternotomy. The heart is top-normal in size. The pulmonary vascularity is normal. There is tortuosity of the descending thoracic aorta and there is mural calcification. There is no significant pleural effusion. There is multilevel degenerative disc disease of the thoracic spine. The patient has undergone posterior fusion in the lumbar spine. IMPRESSION: Chronic bronchitic-smoking related changes,  stable. No pulmonary edema or pneumonia. Thoracic aortic atherosclerosis.  Previous CABG. Electronically Signed   By: David  Martinique M.D.   On: 01/13/2018 11:19    Telemetry    Afib 120's to 130's - Personally Reviewed  Cardiac Studies   Cath pending today.  Patient Profile     77 y.o. female  with a hx of CAD s/p CABG in 1994 with no ischemic events since then, ischemic cardiomyopathy with normalization of EF by TTE in 03/2015 now with newly reduced EF to 30-35% by TTE in 11/2017, pulmonary hypertension, frequent PVCs dating back to 2015, known left bundle branch block, asthma, left-sided breast cancer, back pain, hypertension, psoriasis, PSVT/NSVT, rheumatoid arthritis, and vertigo, who was admitted 1/23 secondary to AFib RVR.  Assessment & Plan    1.  Afib RVR:  Pt had been having palpitations in the setting of prior /o freq PVCs and presented on 1/23 to our office to pick up our Holter monitor.  She reported tachypalps beginning ~ 6pm on 1/22, assoc with intermittent jaw pain.  ECG showed AFib.  Decision was made to have her admitted for heparinization and rate control in the setting of concurrent h/o unstable angina and recent finding of recurrent LV dysfxn with plan for cath, initially sched for 1/28.  She remains in AF with rates in the 120's to 130's.  Cont heparin.  Adding oral Amio this AM.  Plan on cath today and then decision about oral anticoagulation thereafter.  CHA2DS2VASc = 6.  2.  Unstable Angina/CAD:  She hsa been having intermittent sscp, often with emotional upset.  Stress testing was non-ischemic, though EF was down and so that was followed by echo, which confirmed LV dysfxn w/ an EF of 35-40% 12/04/2017.  In that setting, plan for cath.  Initially sched for 1/28 but in setting of AF and need for decision re: [place], plan on cath this AM.  The patient understands that risks include but are not limited to stroke (1 in 1000), death (1 in 59), kidney failure [usually temporary] (1  in 500), bleeding (1 in 200), allergic reaction [possibly serious] (1 in 200), and agrees to proceed.  Contrast allergy  premed ordered. No chest pain overnight but was having jaw pain in the setting of rapid AF on 1/22.  Cont  blocker, asa, and statin.  3.  ICM/HFrEF:  euvolemic on exam.  Cont  blocker.  Will add ARB.  4.  HL:  LDL 80 06/2017.  Tolerating low dose rosuvastatin.  Reluctant to titrate dose previously.  5.  PVCs:  Currently quiescent.  Adding amio in the setting of above.  Signed, Murray Hodgkins, NP  01/14/2018, 8:19 AM    For  questions or updates, please contact   Please consult [email or web address] for contact info under Cardiology/STEMI.

## 2018-01-14 NOTE — Progress Notes (Signed)
Remains clinically stable post procedure. Report called to Lucina Mellow on 2A with plan reviewed. Questions answered.pt denies complaints at this time. No bleeding nor  Hematoma at left brachial site.

## 2018-01-14 NOTE — Progress Notes (Addendum)
ANTICOAGULATION CONSULT NOTE - Initial Consult  Pharmacy Consult for heparin Indication: atrial fibrillation  Allergies  Allergen Reactions  . Sulfa Antibiotics Hives  . Ivp Dye [Iodinated Diagnostic Agents] Swelling  . Codeine Other (See Comments)    ALTERED MENTAL STATUS    Patient Measurements: Height: 5\' 3"  (160 cm) Weight: 173 lb (78.5 kg) IBW/kg (Calculated) : 52.4 Heparin Dosing Weight: 70.3 kg  Vital Signs: Temp: 97.7 F (36.5 C) (01/24 1002) Temp Source: Oral (01/24 1002) BP: 127/60 (01/24 1306) Pulse Rate: 128 (01/24 1306)  Labs: Recent Labs    01/13/18 1140 01/13/18 1321 01/13/18 2103 01/14/18 0513 01/14/18 0626  HGB 15.9  --   --   --  15.5  HCT 49.0*  --   --   --  47.1*  PLT 222  --   --   --  190  APTT 32  --   --   --   --   LABPROT 13.0  --   --   --   --   INR 0.99  --   --   --   --   HEPARINUNFRC  --  <0.10* 0.42 0.37  --   CREATININE 0.70  --   --  0.53  --   TROPONINI <0.03  --   --   --   --     Estimated Creatinine Clearance: 59.3 mL/min (by C-G formula based on SCr of 0.53 mg/dL).   Medical History: Past Medical History:  Diagnosis Date  . Arthritis   . Asthma   . Atrial fibrillation ([place])    a. diagnosed 01/13/2018; b. CHADS2VASc => 6 (CHF, HTN, age x 2, vascular disease, female)  . Back pain   . Breast cancer ([place]) 2009   left breast   . CAD (coronary artery disease)    a. 1994 s/p CABG; b. 03/2015 MV: No ischemia; c. MV 11/18: small fixed apical defect likely secondary to breast attenuation, EF of 42%, frequent PVCs  . Dental crowns present    caps- left back top, right back bottom  . Gastroesophageal reflux disease   . Hypertension   . Ischemic cardiomyopathy    a. 2013 EF 40%;  b. 03/2015 Echo: EF 55-60%, mild MR, mod dil LA, nl RV fxn; c. TTE 12/18: EF of 35-40%, hypokinesis of the anteroseptal, and apical myocardium, normal LV diastolic function parameters, mild to moderate mitral regurgitation, moderately dilated left  atrium measuring 48 mm, normal RV systolic function, moderately elevated pulmonary arterial pressure measuring 53 mmHg  . MI (myocardial infarction) ([place]) 1994  . Personal history of radiation therapy   . Psoriasis   . PSVT (paroxysmal supraventricular tachycardia) ([place])    a. 02/2015 Holter: short runs of SVT and NSVT.  Marland Kitchen Pulmonary hypertension ([place])   . PVC's (premature ventricular contractions)   . Rheumatoid arthritis ([facility])    feet, hands  . Vertigo    approx 2x/yr    Medications:  Infusions:  . sodium chloride    . heparin      Assessment: 77 yof cc AF with PMH arthritis, asthma, breast cancer, CAD, ICM, MI, SVT. Scheduled for cardiac cath Monday with Dr. Fletcher Anon.   Goal of Therapy:  Heparin level 0.3-0.7 units/ml Monitor platelets by anticoagulation protocol: Yes   Plan:  Give 4000 units bolus x 1 Start heparin infusion at 1000 units/hr Check anti-Xa level in 8 hours and daily while on heparin Continue to monitor H&H and platelets   1/23 2100 heparin  level 0.42. Continue current regimen. Recheck heparin level and CBC with tomorrow AM labs.  1/24 AM heparin level 0.37. Continue current regimen. Recheck heparin level and CBC with tomorrow AM labs.  01/14/18 13:39 post cath Dr. Eilene Ghazi pharmacy to resume heparin post cath. Patient has been therapeutic at 1000 units/hr. Will resume heparin at 1000 units/hr (no bolus) this evening at 18:00. Check HL 8 hours after resuming infusion.  Laural Benes, Pharm.D., BCPS Clinical Pharmacist 01/14/2018,1:39 PM   1/25 0200 heparin level 0.22. Increase rate to 1150 units/hr and recheck in 8 hours.   Sim Boast, PharmD, BCPS  01/15/18 3:02 AM

## 2018-01-14 NOTE — H&P (View-Only) (Signed)
Progress Note  Patient Name: Teresa [name] Date of Encounter: 01/14/2018  Primary Cardiologist: Kathlyn Sacramento, MD   Subjective   She continues to complain of palpitations but no chest pain today.  She continues to be in atrial fibrillation with rapid ventricular response.  Inpatient Medications    Scheduled Meds: . amiodarone  400 mg Oral BID  . Apremilast  30 mg Oral BID  . artificial tears  1 application Both Eyes Daily  . [START ON 01/15/2018] aspirin  81 mg Oral Pre-Cath  . aspirin EC  81 mg Oral Daily  . diphenhydrAMINE  50 mg Oral Once   Or  . diphenhydrAMINE  50 mg Intravenous Once  . metoprolol succinate  50 mg Oral Daily  . multivitamin  15 mL Oral Daily  . predniSONE  50 mg Oral Q6H  . rosuvastatin  5 mg Oral Daily  . sodium chloride flush  3 mL Intravenous Q12H  . sodium chloride flush  3 mL Intravenous Q12H   Continuous Infusions: . sodium chloride    . sodium chloride 10 mL/hr at 01/14/18 0602  . sodium chloride    . sodium chloride    . heparin 1,000 Units/hr (01/13/18 1334)   PRN Meds: sodium chloride, sodium chloride, acetaminophen **OR** acetaminophen, acetaminophen, bisacodyl, diphenhydrAMINE, meclizine, montelukast, ondansetron **OR** ondansetron (ZOFRAN) IV, senna-docusate, sodium chloride flush, sodium chloride flush   Vital Signs    Vitals:   01/13/18 1459 01/13/18 2215 01/14/18 0547 01/14/18 0756  BP:  (!) 135/107 115/68 140/77  Pulse:  71 73 (!) 116  Resp:   18   Temp:   (!) 97.5 F (36.4 C)   TempSrc:   Oral   SpO2:  91% 97% 99%  Weight: 173 lb 12.8 oz (78.8 kg)     Height: 5\' 3"  (1.6 m)       Intake/Output Summary (Last 24 hours) at 01/14/2018 0851 Last data filed at 01/14/2018 2409 Gross per 24 hour  Intake 171.67 ml  Output 0 ml  Net 171.67 ml   Filed Weights   01/13/18 1040 01/13/18 1459  Weight: 180 lb (81.6 kg) 173 lb 12.8 oz (78.8 kg)    Telemetry    Atrial fibrillation with rapid ventricular response- Personally  Reviewed  ECG    Not done today- Personally Reviewed  Physical Exam   GEN: No acute distress.   Neck: No JVD Cardiac:  Irregularly irregular and tachycardic, no murmurs, rubs, or gallops.  Respiratory: Clear to auscultation bilaterally. GI: Soft, nontender, non-distended  MS: No edema; No deformity. Neuro:  Nonfocal  Psych: Normal affect  Left radial pulses normal.  Labs    Chemistry Recent Labs  Lab 01/11/18 1150 01/13/18 1140 01/14/18 0513  NA 141 142 139  K 4.1 4.1 4.6  CL 107 109 109  CO2 24 26 20*  GLUCOSE 99 99 160*  BUN 18 12 16   CREATININE 0.52 0.70 0.53  CALCIUM 9.3 9.3 9.6  GFRNONAA >60 >60 >60  GFRAA >60 >60 >60  ANIONGAP 10 7 10      Hematology Recent Labs  Lab 01/11/18 1150 01/13/18 1140 01/14/18 0626  WBC 5.6 6.6 3.7  RBC 4.95 5.38* 5.17  HGB 14.6 15.9 15.5  HCT 45.2 49.0* 47.1*  MCV 91.3 91.1 91.1  MCH 29.4 29.6 29.9  MCHC 32.2 32.4 32.8  RDW 15.1* 15.4* 14.8*  PLT 215 222 190    Cardiac Enzymes Recent Labs  Lab 01/13/18 1140  TROPONINI <0.03   No results  for input(s): TROPIPOC in the last 168 hours.   BNPNo results for input(s): BNP, PROBNP in the last 168 hours.   DDimer No results for input(s): DDIMER in the last 168 hours.   Radiology    Dg Chest 2 View  Result Date: 01/13/2018 CLINICAL DATA:  New onset atrial fibrillation last night. History of hypertension, coronary artery disease and previous MI, asthma, former smoker. EXAM: CHEST  2 VIEW COMPARISON:  Chest x-ray of January 11, 2018 FINDINGS: The lungs are adequately inflated. There is no focal infiltrate. The interstitial markings are coarse though stable. The patient has undergone previous median sternotomy. The heart is top-normal in size. The pulmonary vascularity is normal. There is tortuosity of the descending thoracic aorta and there is mural calcification. There is no significant pleural effusion. There is multilevel degenerative disc disease of the thoracic spine.  The patient has undergone posterior fusion in the lumbar spine. IMPRESSION: Chronic bronchitic-smoking related changes, stable. No pulmonary edema or pneumonia. Thoracic aortic atherosclerosis.  Previous CABG. Electronically Signed   By: David  Martinique M.D.   On: 01/13/2018 11:19    Cardiac Studies     Patient Profile     77 y.o. female with known history of coronary artery disease status post one-vessel CABG in 1994, recent cardiomyopathy, essential hypertension, hyperlipidemia, left bundle branch block and frequent PVCs who presented with atrial fibrillation with rapid ventricular response.  Assessment & Plan    1.  Newly diagnosed atrial fibrillation with rapid ventricular response: The patient had recent recurrent palpitations and thus most likely has paroxysmal atrial fibrillation.  Continue anticoagulation with heparin. Continue rate control with metoprolol.  I increase the dose to 50 mg daily. Given her cardiomyopathy and frequent PVCs, I think using an antiarrhythmic medication is needed to suppress her arrhythmia.  Thus, I elected to add amiodarone 400 mg twice daily.  This can be decreased to 200 mg twice daily after 1 week. If the patient continues to be in A. fib with RVR, TEE guided cardioversion can be considered tomorrow. The plan is to start the patient on Eliquis after cardiac catheterization.  2.  Coronary artery disease involving native coronary arteries: Recent angina.  Noninvasive evaluation showed a drop in LV systolic function and thus the patient was scheduled for an outpatient right and left cardiac catheterization.  We are planning to do this today while hospitalized.  3.  Chronic systolic heart failure: No significant volume overload.  We will hold off on diuresis today until right and left cardiac catheterization is performed with more hemodynamic data.  4.  PVCs: Likely contributing to cardiomyopathy: Start amiodarone as outlined above.    For questions or  updates, please contact [place] Please consult [email or web address] for contact info under Cardiology/STEMI.      Signed, Kathlyn Sacramento, MD  01/14/2018, 8:51 AM

## 2018-01-14 NOTE — Progress Notes (Signed)
[facility] [place] NAME: Teresa [name]    MR#:  [id number]  DATE OF BIRTH:  Jan 08, 1941  SUBJECTIVE:  CHIEF COMPLAINT:   Chief Complaint  Patient presents with  . Atrial Fibrillation  feels stressed about her home situation, very thankful for her care here. Sob & palpitation  REVIEW OF SYSTEMS:  Review of Systems  Constitutional: Negative for chills, fever and weight loss.  HENT: Negative for nosebleeds and sore throat.   Eyes: Negative for blurred vision.  Respiratory: Positive for shortness of breath. Negative for cough and wheezing.   Cardiovascular: Negative for chest pain, orthopnea, leg swelling and PND.  Gastrointestinal: Negative for abdominal pain, constipation, diarrhea, heartburn, nausea and vomiting.  Genitourinary: Negative for dysuria and urgency.  Musculoskeletal: Negative for back pain.  Skin: Negative for rash.  Neurological: Negative for dizziness, speech change, focal weakness and headaches.  Endo/Heme/Allergies: Does not bruise/bleed easily.  Psychiatric/Behavioral: Negative for depression.    DRUG ALLERGIES:   Allergies  Allergen Reactions  . Sulfa Antibiotics Hives  . Ivp Dye [Iodinated Diagnostic Agents] Swelling  . Codeine Other (See Comments)    ALTERED MENTAL STATUS   VITALS:  Blood pressure 127/60, pulse (!) 128, temperature 97.7 F (36.5 C), temperature source Oral, resp. rate 18, height 5\' 3"  (1.6 m), weight 78.5 kg (173 lb), SpO2 97 %. PHYSICAL EXAMINATION:  Physical Exam  Constitutional: She is oriented to person, place, and time and well-developed, well-nourished, and in no distress.  HENT:  Head: Normocephalic and atraumatic.  Eyes: Conjunctivae and EOM are normal. Pupils are equal, round, and reactive to light.  Neck: Normal range of motion. Neck supple. No tracheal deviation present. No thyromegaly present.  Cardiovascular: Normal rate, regular rhythm and normal heart sounds.    Pulmonary/Chest: Effort normal and breath sounds normal. No respiratory distress. She has no wheezes. She exhibits no tenderness.  Abdominal: Soft. Bowel sounds are normal. She exhibits no distension. There is no tenderness.  Musculoskeletal: Normal range of motion.  Neurological: She is alert and oriented to person, place, and time. No cranial nerve deficit.  Skin: Skin is warm and dry. No rash noted.  Psychiatric: Mood and affect normal.   LABORATORY PANEL:  Female CBC Recent Labs  Lab 01/14/18 0626  WBC 3.7  HGB 15.5  HCT 47.1*  PLT 190   ------------------------------------------------------------------------------------------------------------------ Chemistries  Recent Labs  Lab 01/13/18 1140 01/14/18 0513  NA 142 139  K 4.1 4.6  CL 109 109  CO2 26 20*  GLUCOSE 99 160*  BUN 12 16  CREATININE 0.70 0.53  CALCIUM 9.3 9.6  MG 1.9  --    RADIOLOGY:  No results found. ASSESSMENT AND PLAN:  77 year old female with chronic systolic Ejection fraction 35% and essential hypertension who presents with new onset atrial fibrillation.  1. Paroxysmal atrial fibrillation:  - Continue  heparin. Continue rate control with metoprolol - Cardio started amiodarone 400 mg twice daily.  This can be decreased to 200 mg twice daily after 1 week. - If the patient continues to be in A. fib with RVR, she may need TEE guided cardioversion per cardio  2. CAD with unstable angina: s/p cardiac catheterization showing minimal dz not requiring stenting - Continue heparin drip, aspirin, metoprolol, statin  3. Chronic systolic heart failure without signs of exacerbation: She may benefit from Lasix upon discharge.  4. Hyperlipidemia: Continue statin       All the records are reviewed and case discussed  with Care Management/Social Worker. Management plans discussed with the patient, Dr Fletcher Anon and they are in agreement.  CODE STATUS: Full Code  TOTAL TIME TAKING CARE OF THIS PATIENT: 35  minutes.   More than 50% of the time was spent in counseling/coordination of care: YES  POSSIBLE D/C IN 2-3 DAYS, DEPENDING ON CLINICAL CONDITION. And cardiac eval   Max Sane M.D on 01/14/2018 at 6:15 PM  Between 7am to 6pm - Pager - [phone]  After 6pm go to [email or web address] - password [email or web address] [facility]  [employer] [place] [facility]  Office  [phone]  CC: Primary care physician; Jerrol Banana., MD  Note: This dictation was prepared with Dragon dictation along with smaller phrase technology. Any transcriptional errors that result from this process are unintentional.

## 2018-01-14 NOTE — Plan of Care (Signed)
Pt for TEE Cardioversion in am

## 2018-01-14 NOTE — Progress Notes (Signed)
Progress Note  Patient Name: Teresa [name] Date of Encounter: 01/14/2018  Primary Cardiologist: Kathlyn Sacramento, MD   Subjective   She continues to complain of palpitations but no chest pain today.  She continues to be in atrial fibrillation with rapid ventricular response.  Inpatient Medications    Scheduled Meds: . amiodarone  400 mg Oral BID  . Apremilast  30 mg Oral BID  . artificial tears  1 application Both Eyes Daily  . [START ON 01/15/2018] aspirin  81 mg Oral Pre-Cath  . aspirin EC  81 mg Oral Daily  . diphenhydrAMINE  50 mg Oral Once   Or  . diphenhydrAMINE  50 mg Intravenous Once  . metoprolol succinate  50 mg Oral Daily  . multivitamin  15 mL Oral Daily  . predniSONE  50 mg Oral Q6H  . rosuvastatin  5 mg Oral Daily  . sodium chloride flush  3 mL Intravenous Q12H  . sodium chloride flush  3 mL Intravenous Q12H   Continuous Infusions: . sodium chloride    . sodium chloride 10 mL/hr at 01/14/18 0602  . sodium chloride    . sodium chloride    . heparin 1,000 Units/hr (01/13/18 1334)   PRN Meds: sodium chloride, sodium chloride, acetaminophen **OR** acetaminophen, acetaminophen, bisacodyl, diphenhydrAMINE, meclizine, montelukast, ondansetron **OR** ondansetron (ZOFRAN) IV, senna-docusate, sodium chloride flush, sodium chloride flush   Vital Signs    Vitals:   01/13/18 1459 01/13/18 2215 01/14/18 0547 01/14/18 0756  BP:  (!) 135/107 115/68 140/77  Pulse:  71 73 (!) 116  Resp:   18   Temp:   (!) 97.5 F (36.4 C)   TempSrc:   Oral   SpO2:  91% 97% 99%  Weight: 173 lb 12.8 oz (78.8 kg)     Height: 5\' 3"  (1.6 m)       Intake/Output Summary (Last 24 hours) at 01/14/2018 0851 Last data filed at 01/14/2018 6789 Gross per 24 hour  Intake 171.67 ml  Output 0 ml  Net 171.67 ml   Filed Weights   01/13/18 1040 01/13/18 1459  Weight: 180 lb (81.6 kg) 173 lb 12.8 oz (78.8 kg)    Telemetry    Atrial fibrillation with rapid ventricular response- Personally  Reviewed  ECG    Not done today- Personally Reviewed  Physical Exam   GEN: No acute distress.   Neck: No JVD Cardiac:  Irregularly irregular and tachycardic, no murmurs, rubs, or gallops.  Respiratory: Clear to auscultation bilaterally. GI: Soft, nontender, non-distended  MS: No edema; No deformity. Neuro:  Nonfocal  Psych: Normal affect  Left radial pulses normal.  Labs    Chemistry Recent Labs  Lab 01/11/18 1150 01/13/18 1140 01/14/18 0513  NA 141 142 139  K 4.1 4.1 4.6  CL 107 109 109  CO2 24 26 20*  GLUCOSE 99 99 160*  BUN 18 12 16   CREATININE 0.52 0.70 0.53  CALCIUM 9.3 9.3 9.6  GFRNONAA >60 >60 >60  GFRAA >60 >60 >60  ANIONGAP 10 7 10      Hematology Recent Labs  Lab 01/11/18 1150 01/13/18 1140 01/14/18 0626  WBC 5.6 6.6 3.7  RBC 4.95 5.38* 5.17  HGB 14.6 15.9 15.5  HCT 45.2 49.0* 47.1*  MCV 91.3 91.1 91.1  MCH 29.4 29.6 29.9  MCHC 32.2 32.4 32.8  RDW 15.1* 15.4* 14.8*  PLT 215 222 190    Cardiac Enzymes Recent Labs  Lab 01/13/18 1140  TROPONINI <0.03   No results  for input(s): TROPIPOC in the last 168 hours.   BNPNo results for input(s): BNP, PROBNP in the last 168 hours.   DDimer No results for input(s): DDIMER in the last 168 hours.   Radiology    Dg Chest 2 View  Result Date: 01/13/2018 CLINICAL DATA:  New onset atrial fibrillation last night. History of hypertension, coronary artery disease and previous MI, asthma, former smoker. EXAM: CHEST  2 VIEW COMPARISON:  Chest x-ray of January 11, 2018 FINDINGS: The lungs are adequately inflated. There is no focal infiltrate. The interstitial markings are coarse though stable. The patient has undergone previous median sternotomy. The heart is top-normal in size. The pulmonary vascularity is normal. There is tortuosity of the descending thoracic aorta and there is mural calcification. There is no significant pleural effusion. There is multilevel degenerative disc disease of the thoracic spine.  The patient has undergone posterior fusion in the lumbar spine. IMPRESSION: Chronic bronchitic-smoking related changes, stable. No pulmonary edema or pneumonia. Thoracic aortic atherosclerosis.  Previous CABG. Electronically Signed   By: David  Martinique M.D.   On: 01/13/2018 11:19    Cardiac Studies     Patient Profile     77 y.o. female with known history of coronary artery disease status post one-vessel CABG in 1994, recent cardiomyopathy, essential hypertension, hyperlipidemia, left bundle branch block and frequent PVCs who presented with atrial fibrillation with rapid ventricular response.  Assessment & Plan    1.  Newly diagnosed atrial fibrillation with rapid ventricular response: The patient had recent recurrent palpitations and thus most likely has paroxysmal atrial fibrillation.  Continue anticoagulation with heparin. Continue rate control with metoprolol.  I increase the dose to 50 mg daily. Given her cardiomyopathy and frequent PVCs, I think using an antiarrhythmic medication is needed to suppress her arrhythmia.  Thus, I elected to add amiodarone 400 mg twice daily.  This can be decreased to 200 mg twice daily after 1 week. If the patient continues to be in A. fib with RVR, TEE guided cardioversion can be considered tomorrow. The plan is to start the patient on Eliquis after cardiac catheterization.  2.  Coronary artery disease involving native coronary arteries: Recent angina.  Noninvasive evaluation showed a drop in LV systolic function and thus the patient was scheduled for an outpatient right and left cardiac catheterization.  We are planning to do this today while hospitalized.  3.  Chronic systolic heart failure: No significant volume overload.  We will hold off on diuresis today until right and left cardiac catheterization is performed with more hemodynamic data.  4.  PVCs: Likely contributing to cardiomyopathy: Start amiodarone as outlined above.    For questions or  updates, please contact [place] Please consult [email or web address] for contact info under Cardiology/STEMI.      Signed, Kathlyn Sacramento, MD  01/14/2018, 8:51 AM

## 2018-01-15 ENCOUNTER — Other Ambulatory Visit: Payer: Self-pay

## 2018-01-15 ENCOUNTER — Encounter
Admission: EM | Disposition: A | Discharge status: Home / Self Care | Payer: Self-pay | Source: Home / Self Care | Attending: Internal Medicine

## 2018-01-15 ENCOUNTER — Inpatient Hospital Stay (HOSPITAL_COMMUNITY)
Admit: 2018-01-15 | Discharge: 2018-01-15 | Disposition: A | Discharge status: Home / Self Care | Payer: Medicare HMO | Attending: Nurse Practitioner | Admitting: Nurse Practitioner

## 2018-01-15 ENCOUNTER — Telehealth: Payer: Self-pay

## 2018-01-15 ENCOUNTER — Inpatient Hospital Stay: Payer: Medicare HMO | Admitting: Registered Nurse

## 2018-01-15 ENCOUNTER — Encounter: Payer: Self-pay | Admitting: *Deleted

## 2018-01-15 ENCOUNTER — Telehealth: Payer: Self-pay | Admitting: Physician Assistant

## 2018-01-15 DIAGNOSIS — I2510 Atherosclerotic heart disease of native coronary artery without angina pectoris: Secondary | ICD-10-CM

## 2018-01-15 DIAGNOSIS — I340 Nonrheumatic mitral (valve) insufficiency: Secondary | ICD-10-CM

## 2018-01-15 HISTORY — PX: CARDIOVERSION: EP1203

## 2018-01-15 HISTORY — PX: TEE WITHOUT CARDIOVERSION: SHX5443

## 2018-01-15 LAB — CBC
HCT: 44 % (ref 35.0–47.0)
Hemoglobin: 14.5 g/dL (ref 12.0–16.0)
MCH: 29.9 pg (ref 26.0–34.0)
MCHC: 33 g/dL (ref 32.0–36.0)
MCV: 90.7 fL (ref 80.0–100.0)
Platelets: 187 10*3/uL (ref 150–440)
RBC: 4.85 MIL/uL (ref 3.80–5.20)
RDW: 14.8 % — ABNORMAL HIGH (ref 11.5–14.5)
WBC: 10.1 10*3/uL (ref 3.6–11.0)

## 2018-01-15 LAB — HEPARIN LEVEL (UNFRACTIONATED): Heparin Unfractionated: 0.22 IU/mL — ABNORMAL LOW (ref 0.30–0.70)

## 2018-01-15 SURGERY — CARDIOVERSION (CATH LAB)
Anesthesia: General

## 2018-01-15 MED ORDER — METOPROLOL SUCCINATE ER 50 MG PO TB24: 50.0000 mg | ORAL_TABLET | Freq: Every day | ORAL | 0 refills | Status: DC

## 2018-01-15 MED ORDER — BUTAMBEN-TETRACAINE-BENZOCAINE 2-2-14 % EX AERO
INHALATION_SPRAY | CUTANEOUS | Status: AC
  Filled 2018-01-15: qty 5

## 2018-01-15 MED ORDER — METOPROLOL SUCCINATE ER 25 MG PO TB24
25.0000 mg | ORAL_TABLET | Freq: Every day | ORAL | Status: DC
  Administered 2018-01-15: 25 mg via ORAL
  Filled 2018-01-15: qty 1

## 2018-01-15 MED ORDER — APIXABAN 5 MG PO TABS: 5.0000 mg | ORAL_TABLET | Freq: Two times a day (BID) | ORAL | 0 refills | Status: DC

## 2018-01-15 MED ORDER — APIXABAN 5 MG PO TABS: 5.0000 mg | ORAL_TABLET | Freq: Two times a day (BID) | ORAL | Status: DC

## 2018-01-15 MED ORDER — APIXABAN 5 MG PO TABS
5.0000 mg | ORAL_TABLET | Freq: Two times a day (BID) | ORAL | Status: DC
  Administered 2018-01-15: 5 mg via ORAL
  Filled 2018-01-15: qty 1

## 2018-01-15 MED ORDER — ZOLPIDEM TARTRATE 5 MG PO TABS
2.5000 mg | ORAL_TABLET | Freq: Every evening | ORAL | Status: DC | PRN
  Administered 2018-01-15 (×2): 2.5 mg via ORAL
  Filled 2018-01-15 (×2): qty 1

## 2018-01-15 MED ORDER — LIDOCAINE VISCOUS 2 % MT SOLN
OROMUCOSAL | Status: AC
  Filled 2018-01-15: qty 15

## 2018-01-15 MED ORDER — METOPROLOL TARTRATE 5 MG/5ML IV SOLN: 5.0000 mg | Freq: Four times a day (QID) | INTRAVENOUS | Status: DC | PRN

## 2018-01-15 MED ORDER — AMIODARONE HCL 200 MG PO TABS: 400.0000 mg | ORAL_TABLET | Freq: Two times a day (BID) | ORAL | 0 refills | Status: DC

## 2018-01-15 MED ORDER — PROPOFOL 10 MG/ML IV BOLUS
INTRAVENOUS | Status: DC | PRN
  Administered 2018-01-15 (×2): 10 mg via INTRAVENOUS
  Administered 2018-01-15: 60 mg via INTRAVENOUS

## 2018-01-15 NOTE — Progress Notes (Signed)
Progress Note  Patient Name: Teresa [name] Date of Encounter: 01/15/2018  Primary Cardiologist: Kathlyn Sacramento, MD   Subjective   She underwent cardiac catheterization yesterday which showed no evidence of obstructive coronary artery disease.  She remained in atrial fibrillation with rapid ventricular response.  She was started on amiodarone yesterday and continued anticoagulation with heparin. She underwent TEE guided cardioversion this morning and she is back in sinus rhythm.  Inpatient Medications    Scheduled Meds: . amiodarone  400 mg Oral BID  . Apremilast  30 mg Oral BID  . artificial tears  1 application Both Eyes Daily  . aspirin EC  81 mg Oral Daily  . butamben-tetracaine-benzocaine      . lidocaine      . metoprolol succinate  50 mg Oral Daily  . multivitamin  15 mL Oral Daily  . rosuvastatin  5 mg Oral Daily  . sodium chloride flush  3 mL Intravenous Q12H   Continuous Infusions: . sodium chloride    . sodium chloride 20 mL/hr at 01/14/18 1805  . heparin 1,150 Units/hr (01/15/18 0317)   PRN Meds: sodium chloride, acetaminophen **OR** acetaminophen, bisacodyl, diphenhydrAMINE, meclizine, metoprolol tartrate, montelukast, ondansetron **OR** ondansetron (ZOFRAN) IV, senna-docusate, sodium chloride flush, zolpidem   Vital Signs    Vitals:   01/15/18 0825 01/15/18 0826 01/15/18 0827 01/15/18 0828  BP: 118/73  118/62 118/62  Pulse: 94 90 (!) 56 (!) 56  Resp: 18 20 18 19   Temp:      TempSrc:      SpO2: 98% 98% 100% 99%  Weight:      Height:        Intake/Output Summary (Last 24 hours) at 01/15/2018 0837 Last data filed at 01/15/2018 0830 Gross per 24 hour  Intake 540 ml  Output -  Net 540 ml   Filed Weights   01/13/18 1459 01/14/18 1002 01/15/18 0732  Weight: 173 lb 12.8 oz (78.8 kg) 173 lb (78.5 kg) 173 lb (78.5 kg)    Telemetry    Atrial fibrillation with rapid ventricular response- Personally Reviewed  ECG    Not done today- Personally  Reviewed  Physical Exam   GEN: No acute distress.   Neck: No JVD Cardiac:  Irregularly irregular and tachycardic, no murmurs, rubs, or gallops.  Respiratory: Clear to auscultation bilaterally. GI: Soft, nontender, non-distended  MS: No edema; No deformity. Neuro:  Nonfocal  Psych: Normal affect    Labs    Chemistry Recent Labs  Lab 01/11/18 1150 01/13/18 1140 01/14/18 0513  NA 141 142 139  K 4.1 4.1 4.6  CL 107 109 109  CO2 24 26 20*  GLUCOSE 99 99 160*  BUN 18 12 16   CREATININE 0.52 0.70 0.53  CALCIUM 9.3 9.3 9.6  GFRNONAA >60 >60 >60  GFRAA >60 >60 >60  ANIONGAP 10 7 10      Hematology Recent Labs  Lab 01/13/18 1140 01/14/18 0626 01/15/18 0200  WBC 6.6 3.7 10.1  RBC 5.38* 5.17 4.85  HGB 15.9 15.5 14.5  HCT 49.0* 47.1* 44.0  MCV 91.1 91.1 90.7  MCH 29.6 29.9 29.9  MCHC 32.4 32.8 33.0  RDW 15.4* 14.8* 14.8*  PLT 222 190 187    Cardiac Enzymes Recent Labs  Lab 01/13/18 1140  TROPONINI <0.03   No results for input(s): TROPIPOC in the last 168 hours.   BNPNo results for input(s): BNP, PROBNP in the last 168 hours.   DDimer No results for input(s): DDIMER in the last  168 hours.   Radiology    Dg Chest 2 View  Result Date: 01/13/2018 CLINICAL DATA:  New onset atrial fibrillation last night. History of hypertension, coronary artery disease and previous MI, asthma, former smoker. EXAM: CHEST  2 VIEW COMPARISON:  Chest x-ray of January 11, 2018 FINDINGS: The lungs are adequately inflated. There is no focal infiltrate. The interstitial markings are coarse though stable. The patient has undergone previous median sternotomy. The heart is top-normal in size. The pulmonary vascularity is normal. There is tortuosity of the descending thoracic aorta and there is mural calcification. There is no significant pleural effusion. There is multilevel degenerative disc disease of the thoracic spine. The patient has undergone posterior fusion in the lumbar spine. IMPRESSION:  Chronic bronchitic-smoking related changes, stable. No pulmonary edema or pneumonia. Thoracic aortic atherosclerosis.  Previous CABG. Electronically Signed   By: David  Martinique M.D.   On: 01/13/2018 11:19    Cardiac Studies     Patient Profile     77 y.o. female with known history of coronary artery disease status post one-vessel CABG in 1994, recent cardiomyopathy, essential hypertension, hyperlipidemia, left bundle branch block and frequent PVCs who presented with atrial fibrillation with rapid ventricular response.  Assessment & Plan    1.  Newly diagnosed atrial fibrillation with rapid ventricular response:  Status post successful TEE guided cardioversion this morning.  She is now in sinus rhythm.   Continue amiodarone 400 mg twice daily for another 5 days then decrease the dose to 200 mg twice daily.   Continue Toprol at 50 mg once daily.   I started anticoagulation with Eliquis 5 mg twice daily with 5-hour overlap with heparin drip.   The patient can likely be discharged home later in the afternoon if she remains stable.    2.  Coronary artery disease involving native coronary arteries: Recent worsening of ejection fraction.  Cardiac catheterization showed no evidence of obstructive coronary artery disease.  The drop in LV systolic function was likely due to atrial fibrillation and frequent PVCs.   3.  Chronic systolic heart failure: No significant volume overload. LVEDP was only mildly elevated.   4.  PVCs: Likely contributing to cardiomyopathy: improving with Amiodarone and Toprol.     For questions or updates, please contact [place] Please consult [email or web address] for contact info under Cardiology/STEMI.      Signed, Kathlyn Sacramento, MD  01/15/2018, 8:37 AM

## 2018-01-15 NOTE — Progress Notes (Signed)
*  PRELIMINARY RESULTS* Echocardiogram Echocardiogram Transesophageal has been performed.  Sherrie Sport 01/15/2018, 8:43 AM

## 2018-01-15 NOTE — Care Management (Addendum)
Eliquis is $142.00 for one month. Patient states she cannot afford that and will not take it.  I have provided her with a free 30-card. I advised patient to talk with her PCP/cardiologist this month about something cheaper. She agreed. 11:41AM update: [facility] outpatient pharmacy will fill 30-day supply. Coupon retrieved from Patient and given to pharmacist. Patient updated and appreciative.  I have updated Dr. Manuella Ghazi of patient concern for cost. I spoke with [facility] [phone] and patient is in her deductible which is why it's so expensive ($95 deductible-   $47 co-pay after deductible met). Patient agrees.

## 2018-01-15 NOTE — Telephone Encounter (Signed)
[facility] called patient is been discharged from there today and appointment made for 01/27/2018 for transition of care-Khara Renaud V Simon Llamas, RMA

## 2018-01-15 NOTE — Transfer of Care (Signed)
Immediate Anesthesia Transfer of Care Note  Patient: Teresa [name]  Procedure(s) Performed: CARDIOVERSION (N/A ) TRANSESOPHAGEAL ECHOCARDIOGRAM (TEE) (N/A )  Patient Location: PACU  Anesthesia Type:General  Level of Consciousness: awake, alert  and oriented  Airway & Oxygen Therapy: Patient Spontanous Breathing and Patient connected to nasal cannula oxygen  Post-op Assessment: Report given to RN and Post -op Vital signs reviewed and stable  Post vital signs: Reviewed and stable  Last Vitals:  Vitals:   01/15/18 0827 01/15/18 0828  BP: 118/62 118/62  Pulse: (!) 56 (!) 56  Resp: 18 19  Temp:    SpO2: 100% 99%    Last Pain:  Vitals:   01/15/18 0732  TempSrc: Oral  PainSc:          Complications: No apparent anesthesia complications

## 2018-01-15 NOTE — Progress Notes (Signed)
ANTICOAGULATION CONSULT NOTE - Initial Consult  Pharmacy Consult for Apixaban Indication: atrial fibrillation  Allergies  Allergen Reactions  . Sulfa Antibiotics Hives  . Ivp Dye [Iodinated Diagnostic Agents] Swelling  . Codeine Other (See Comments)    ALTERED MENTAL STATUS    Patient Measurements: Height: 5\' 3"  (160 cm) Weight: 173 lb (78.5 kg) IBW/kg (Calculated) : 52.4 Heparin Dosing Weight:    Vital Signs: Temp: 98 F (36.7 C) (01/25 0732) Temp Source: Oral (01/25 0732) BP: 121/68 (01/25 0845) Pulse Rate: 53 (01/25 0845)  Labs: Recent Labs    01/13/18 1140  01/13/18 2103 01/14/18 0513 01/14/18 0626 01/15/18 0200  HGB 15.9  --   --   --  15.5 14.5  HCT 49.0*  --   --   --  47.1* 44.0  PLT 222  --   --   --  190 187  APTT 32  --   --   --   --   --   LABPROT 13.0  --   --   --   --   --   INR 0.99  --   --   --   --   --   HEPARINUNFRC  --    < > 0.42 0.37  --  0.22*  CREATININE 0.70  --   --  0.53  --   --   TROPONINI <0.03  --   --   --   --   --    < > = values in this interval not displayed.    Estimated Creatinine Clearance: 59.3 mL/min (by C-G formula based on SCr of 0.53 mg/dL).   Medical History: Past Medical History:  Diagnosis Date  . Arthritis   . Asthma   . Atrial fibrillation ([place])    a. diagnosed 01/13/2018; b. CHADS2VASc => 6 (CHF, HTN, age x 2, vascular disease, female)  . Back pain   . Breast cancer ([place]) 2009   left breast   . CAD (coronary artery disease)    a. 1994 s/p CABG; b. 03/2015 MV: No ischemia; c. MV 11/18: small fixed apical defect likely secondary to breast attenuation, EF of 42%, frequent PVCs  . Dental crowns present    caps- left back top, right back bottom  . Gastroesophageal reflux disease   . Hypertension   . Ischemic cardiomyopathy    a. 2013 EF 40%;  b. 03/2015 Echo: EF 55-60%, mild MR, mod dil LA, nl RV fxn; c. TTE 12/18: EF of 35-40%, hypokinesis of the anteroseptal, and apical myocardium, normal LV diastolic  function parameters, mild to moderate mitral regurgitation, moderately dilated left atrium measuring 48 mm, normal RV systolic function, moderately elevated pulmonary arterial pressure measuring 53 mmHg  . MI (myocardial infarction) ([place]) 1994  . Personal history of radiation therapy   . Psoriasis   . PSVT (paroxysmal supraventricular tachycardia) ([place])    a. 02/2015 Holter: short runs of SVT and NSVT.  Marland Kitchen Pulmonary hypertension ([place])   . PVC's (premature ventricular contractions)   . Rheumatoid arthritis ([facility])    feet, hands  . Vertigo    approx 2x/yr     Assessment: 77 yo F on Heparin drip for new Afib. Had cardiac cath on 1/24 and cardioversion today 1/25. Now to transition to apixaban  Goal of Therapy:  Monitor platelets by anticoagulation protocol: Yes   Plan:  Begin Apixaban 5 mg po bid.  Per MD, plan 5 hour overlap with Heparin drip.  Kariana Wiles A 01/15/2018,8:48  AM

## 2018-01-15 NOTE — CV Procedure (Signed)
Cardioversion note: A standard informed consent was obtained. Timeout was performed. The pads were placed in the anterior posterior fashion. The patient was given propofol by the anesthesia team. TEE was done and showed no evidence of intracardiac thrombus.  Successful cardioversion was performed with a 200 J. The patient converted to sinus rhythm. Pre-and post EKGs were reviewed. The patient tolerated the procedure with no immediate complications.  Recommendations: Start Eliquis and overlap with Heparin for 6 hours.

## 2018-01-15 NOTE — Progress Notes (Signed)
Notified MD of pt not being able to sleep,tearful,and agitated. Requesting something for rest. HR A-fib w/ RVR. Orders placed. Will continue to monitor and assess.

## 2018-01-15 NOTE — Anesthesia Postprocedure Evaluation (Signed)
Anesthesia Post Note  Patient: Teresa [name]  Procedure(s) Performed: CARDIOVERSION (N/A ) TRANSESOPHAGEAL ECHOCARDIOGRAM (TEE) (N/A )  Patient location during evaluation: Other Anesthesia Type: General Level of consciousness: awake and alert and oriented Pain management: pain level controlled Vital Signs Assessment: post-procedure vital signs reviewed and stable Respiratory status: spontaneous breathing, nonlabored ventilation and respiratory function stable Cardiovascular status: blood pressure returned to baseline and stable Postop Assessment: no signs of nausea or vomiting Anesthetic complications: no     Last Vitals:  Vitals:   01/15/18 0835 01/15/18 0845  BP: (!) 107/56 121/68  Pulse: (!) 53 (!) 53  Resp: 16 18  Temp:    SpO2: 98% 99%    Last Pain:  Vitals:   01/15/18 0732  TempSrc: Oral  PainSc:                  [name]

## 2018-01-15 NOTE — Progress Notes (Signed)
Teresa [name] to be D/C'd Home per MD order.  Discussed prescriptions and follow up appointments with the patient. Prescriptions given to patient, medication list explained in detail. Pt verbalized understanding.  Allergies as of 01/15/2018      Reactions   Sulfa Antibiotics Hives   Ivp Dye [iodinated Diagnostic Agents] Swelling   Codeine Other (See Comments)   ALTERED MENTAL STATUS      Medication List    STOP taking these medications   amoxicillin 500 MG capsule Commonly known as:  AMOXIL   predniSONE 50 MG tablet Commonly known as:  DELTASONE     TAKE these medications   acetaminophen 325 MG tablet Commonly known as:  TYLENOL Take 650 mg by mouth every 6 (six) hours as needed for moderate pain or headache.   amiodarone 200 MG tablet Commonly known as:  PACERONE Take 2 tablets (400 mg total) by mouth 2 (two) times daily. Taper to 200 mg PO bid after 5 days   apixaban 5 MG Tabs tablet Commonly known as:  ELIQUIS Take 1 tablet (5 mg total) by mouth 2 (two) times daily.   aspirin EC 81 MG tablet Take 1 tablet (81 mg total) by mouth daily.   budesonide-formoterol 160-4.5 MCG/ACT inhaler Commonly known as:  SYMBICORT Inhale 2 puffs into the lungs 2 (two) times daily. What changed:    when to take this  reasons to take this   diphenhydrAMINE 25 MG tablet Commonly known as:  BENADRYL Take 25 mg by mouth every 6 (six) hours as needed for allergies. What changed:  Another medication with the same name was removed. Continue taking this medication, and follow the directions you see here.   meclizine 25 MG tablet Commonly known as:  ANTIVERT Take 25 mg by mouth 3 (three) times daily as needed for dizziness.   meloxicam 7.5 MG tablet Commonly known as:  MOBIC Take 1 tablet (7.5 mg total) by mouth daily. What changed:    when to take this  reasons to take this   metoprolol succinate 50 MG 24 hr tablet Commonly known as:  TOPROL-XL Take 1 tablet (50 mg total) by  mouth daily. What changed:    medication strength  how much to take   montelukast 10 MG tablet Commonly known as:  SINGULAIR Take 10 mg by mouth daily as needed (for allergies).   MULTIVITAMIN PO Take 1 tablet by mouth daily.   OTEZLA 30 MG Tabs Generic drug:  Apremilast Take 30 mg by mouth 2 (two) times daily.   rosuvastatin 5 MG tablet Commonly known as:  CRESTOR Take 1 tablet (5 mg total) by mouth daily.   SYSTANE OP Place 2 drops into both eyes daily.       Vitals:   01/15/18 1030 01/15/18 1452  BP:  131/63  Pulse: 63 62  Resp:  11  Temp:  (!) 97.3 F (36.3 C)  SpO2:  100%    Tele box removed and returned.Skin clean, dry and intact without evidence of skin break down, no evidence of skin tears noted. IV catheter discontinued intact. Site without signs and symptoms of complications. Dressing and pressure applied. Pt denies pain at this time. No complaints noted.  An After Visit Summary was printed and given to the patient. Patient escorted via [place], and D/C home via private auto.  Rolley Sims

## 2018-01-15 NOTE — Progress Notes (Signed)
Pt returns from cardioversion. No spots on chest noted, no complaints of pain. RN will report to attending RN.

## 2018-01-15 NOTE — Anesthesia Preprocedure Evaluation (Signed)
Anesthesia Evaluation  Patient identified by MRN, date of birth, ID band Patient awake    Reviewed: Allergy & Precautions, NPO status , Patient's Chart, lab work & pertinent test results  History of Anesthesia Complications Negative for: history of anesthetic complications  Airway Mallampati: III  TM Distance: >3 FB Neck ROM: Full    Dental  (+) Implants   Pulmonary asthma (several years since she's had any problems) , neg sleep apnea, neg COPD, former smoker,    breath sounds clear to auscultation- rhonchi (-) wheezing      Cardiovascular hypertension, + CAD, + Past MI and + CABG (1994)  + dysrhythmias Atrial Fibrillation  Rhythm:Regular Rate:Normal - Systolic murmurs and - Diastolic murmurs L heart cath 01/14/17: 1.  Mild nonobstructive coronary artery disease.  There is 20-30% stenosis in the LAD.  Atretic LIMA to LAD 2.  Mildly elevated left ventricular end-diastolic pressure.  Echo 12/04/17: - Left ventricle: The cavity size was normal. Systolic function was   moderately reduced. The estimated ejection fraction was in the   range of 35% to 40%. Hypokinesis of the anteroseptal myocardium.   Hypokinesis of the apical myocardium. Left ventricular diastolic   function parameters were normal. - Mitral valve: There was mild to moderate regurgitation. - Left atrium: The atrium was moderately dilated. - Right ventricle: Systolic function was normal. - Pulmonary arteries: Systolic pressure was moderately elevated. PA   peak pressure: 53 mm Hg (S).   Neuro/Psych negative neurological ROS  negative psych ROS   GI/Hepatic Neg liver ROS, GERD  ,  Endo/Other  negative endocrine ROSneg diabetes  Renal/GU negative Renal ROS     Musculoskeletal  (+) Arthritis ,   Abdominal (+) + obese,   Peds  Hematology negative hematology ROS (+)   Anesthesia Other Findings Past Medical History: No date: Arthritis No date: Asthma No  date: Atrial fibrillation ([place])     Comment:  a. diagnosed 01/13/2018; b. CHADS2VASc => 6 (CHF, HTN,               age x 2, vascular disease, female) No date: Back pain 2009: Breast cancer ([place])     Comment:  left breast  No date: CAD (coronary artery disease)     Comment:  a. 1994 s/p CABG; b. 03/2015 MV: No ischemia; c. MV               11/18: small fixed apical defect likely secondary to               breast attenuation, EF of 42%, frequent PVCs No date: Dental crowns present     Comment:  caps- left back top, right back bottom No date: Gastroesophageal reflux disease No date: Hypertension No date: Ischemic cardiomyopathy     Comment:  a. 2013 EF 40%;  b. 03/2015 Echo: EF 55-60%, mild MR, mod              dil LA, nl RV fxn; c. TTE 12/18: EF of 35-40%,               hypokinesis of the anteroseptal, and apical myocardium,               normal LV diastolic function parameters, mild to moderate              mitral regurgitation, moderately dilated left atrium               measuring 48 mm, normal RV systolic function, moderately  elevated pulmonary arterial pressure measuring 53 mmHg 1994: MI (myocardial infarction) ([place]) No date: Personal history of radiation therapy No date: Psoriasis No date: PSVT (paroxysmal supraventricular tachycardia) ([place])     Comment:  a. 02/2015 Holter: short runs of SVT and NSVT. No date: Pulmonary hypertension (HCC) No date: PVC's (premature ventricular contractions) No date: Rheumatoid arthritis ([place])     Comment:  feet, hands No date: Vertigo     Comment:  approx 2x/yr   Reproductive/Obstetrics                             Anesthesia Physical Anesthesia Plan  ASA: III  Anesthesia Plan: General   Post-op Pain Management:    Induction: Intravenous  PONV Risk Score and Plan: 2 and Propofol infusion  Airway Management Planned: Natural Airway  Additional Equipment:   Intra-op Plan:   Post-operative Plan:    Informed Consent: I have reviewed the patients History and Physical, chart, labs and discussed the procedure including the risks, benefits and alternatives for the proposed anesthesia with the patient or authorized representative who has indicated his/her understanding and acceptance.   Dental advisory given  Plan Discussed with: CRNA and Anesthesiologist  Anesthesia Plan Comments:         Anesthesia Quick Evaluation

## 2018-01-15 NOTE — Anesthesia Post-op Follow-up Note (Signed)
Anesthesia QCDR form completed.        

## 2018-01-15 NOTE — Telephone Encounter (Signed)
Patient currently admitted at this time. 

## 2018-01-15 NOTE — Telephone Encounter (Signed)
TCM....  Patient is being discharged   They saw R. Dunn  They are scheduled to see R. Dunn on 2/12  They were seen for new onset A.fib with RVR  They need to be seen within 2 weeks  Pt not added to wait list   Please call

## 2018-01-15 NOTE — Interval H&P Note (Signed)
History and Physical Interval Note:  01/15/2018 8:10 AM  Teresa [name]  has presented today for surgery, with the diagnosis of atrial fib  The various methods of treatment have been discussed with the patient and family. After consideration of risks, benefits and other options for treatment, the patient has consented to  Procedure(s): CARDIOVERSION (N/A) TRANSESOPHAGEAL ECHOCARDIOGRAM (TEE) (N/A) as a surgical intervention .  The patient's history has been reviewed, patient examined, no change in status, stable for surgery.  I have reviewed the patient's chart and labs.  Questions were answered to the patient's satisfaction.     Kathlyn Sacramento

## 2018-01-15 NOTE — Discharge Instructions (Signed)

## 2018-01-15 NOTE — Telephone Encounter (Signed)
See below thank Edrick Kins, RMA

## 2018-01-17 NOTE — Discharge Summary (Signed)
[facility] [place] NAME: Teresa [name]    MR#:  [id number]  [place]:  05/12/1941  DATE OF ADMISSION:  01/13/2018   ADMITTING PHYSICIAN: Bettey Costa, MD  DATE OF DISCHARGE: 01/15/2018  3:34 PM  PRIMARY CARE PHYSICIAN: Jerrol Banana., MD   ADMISSION DIAGNOSIS:  Atrial fibrillation with rapid ventricular response (HCC) [I48.91] DISCHARGE DIAGNOSIS:  Active Problems:   Atrial fibrillation with rapid ventricular response (HCC)   Unstable angina ([place])  SECONDARY DIAGNOSIS:   Past Medical History:  Diagnosis Date  . Arthritis   . Asthma   . Atrial fibrillation ([place])    a. diagnosed 01/13/2018; b. CHADS2VASc => 6 (CHF, HTN, age x 2, vascular disease, female)  . Back pain   . Breast cancer ([place]) 2009   left breast   . CAD (coronary artery disease)    a. 1994 s/p CABG; b. 03/2015 MV: No ischemia; c. MV 11/18: small fixed apical defect likely secondary to breast attenuation, EF of 42%, frequent PVCs  . Dental crowns present    caps- left back top, right back bottom  . Gastroesophageal reflux disease   . Hypertension   . Ischemic cardiomyopathy    a. 2013 EF 40%;  b. 03/2015 Echo: EF 55-60%, mild MR, mod dil LA, nl RV fxn; c. TTE 12/18: EF of 35-40%, hypokinesis of the anteroseptal, and apical myocardium, normal LV diastolic function parameters, mild to moderate mitral regurgitation, moderately dilated left atrium measuring 48 mm, normal RV systolic function, moderately elevated pulmonary arterial pressure measuring 53 mmHg  . MI (myocardial infarction) ([place]) 1994  . Personal history of radiation therapy   . Psoriasis   . PSVT (paroxysmal supraventricular tachycardia) ([place])    a. 02/2015 Holter: short runs of SVT and NSVT.  Marland Kitchen Pulmonary hypertension ([place])   . PVC's (premature ventricular contractions)   . Rheumatoid arthritis (HCC)    feet, hands  . Vertigo    approx 2x/yr   HOSPITAL COURSE:  77 y.o. female with known  history of coronary artery disease status post one-vessel CABG in 1994, recent cardiomyopathy, essential hypertension, hyperlipidemia, left bundle branch block and frequent PVCs who presented with atrial fibrillation with rapid ventricular response.  1.  Newly diagnosed atrial fibrillation with rapid ventricular response:  Status post successful TEE guided cardioversion this admission.  She is now in sinus rhythm.   Continue amiodarone 400 mg twice daily for another 5 days then decrease the dose to 200 mg twice daily per cardio.   Continue Toprol at 50 mg once daily.   - anticoagulation with Eliquis 5 mg twice daily  2.  Coronary artery disease involving native coronary arteries: Recent worsening of ejection fraction.  Cardiac catheterization showed no evidence of obstructive coronary artery disease.  The drop in LV systolic function was likely due to atrial fibrillation and frequent PVCs.   3.  Chronic systolic heart failure: No significant volume overload. LVEDP was only mildly elevated.   4.  PVCs: Likely contributing to cardiomyopathy: improving with Amiodarone and Toprol.  DISCHARGE CONDITIONS:  stable CONSULTS OBTAINED:  Treatment Team:  Wellington Hampshire, MD End, Harrell Gave, MD DRUG ALLERGIES:   Allergies  Allergen Reactions  . Sulfa Antibiotics Hives  . Ivp Dye [Iodinated Diagnostic Agents] Swelling  . Codeine Other (See Comments)    ALTERED MENTAL STATUS   DISCHARGE MEDICATIONS:   Allergies as of 01/15/2018      Reactions   Sulfa Antibiotics Hives  Ivp Dye [iodinated Diagnostic Agents] Swelling   Codeine Other (See Comments)   ALTERED MENTAL STATUS      Medication List    STOP taking these medications   amoxicillin 500 MG capsule Commonly known as:  AMOXIL   predniSONE 50 MG tablet Commonly known as:  DELTASONE     TAKE these medications   acetaminophen 325 MG tablet Commonly known as:  TYLENOL Take 650 mg by mouth every 6 (six) hours as needed for  moderate pain or headache.   amiodarone 200 MG tablet Commonly known as:  PACERONE Take 2 tablets (400 mg total) by mouth 2 (two) times daily. Taper to 200 mg PO bid after 5 days   apixaban 5 MG Tabs tablet Commonly known as:  ELIQUIS Take 1 tablet (5 mg total) by mouth 2 (two) times daily.   aspirin EC 81 MG tablet Take 1 tablet (81 mg total) by mouth daily.   budesonide-formoterol 160-4.5 MCG/ACT inhaler Commonly known as:  SYMBICORT Inhale 2 puffs into the lungs 2 (two) times daily. What changed:    when to take this  reasons to take this   diphenhydrAMINE 25 MG tablet Commonly known as:  BENADRYL Take 25 mg by mouth every 6 (six) hours as needed for allergies. What changed:  Another medication with the same name was removed. Continue taking this medication, and follow the directions you see here.   meclizine 25 MG tablet Commonly known as:  ANTIVERT Take 25 mg by mouth 3 (three) times daily as needed for dizziness.   meloxicam 7.5 MG tablet Commonly known as:  MOBIC Take 1 tablet (7.5 mg total) by mouth daily. What changed:    when to take this  reasons to take this   metoprolol succinate 50 MG 24 hr tablet Commonly known as:  TOPROL-XL Take 1 tablet (50 mg total) by mouth daily. What changed:    medication strength  how much to take   montelukast 10 MG tablet Commonly known as:  SINGULAIR Take 10 mg by mouth daily as needed (for allergies).   MULTIVITAMIN PO Take 1 tablet by mouth daily.   OTEZLA 30 MG Tabs Generic drug:  Apremilast Take 30 mg by mouth 2 (two) times daily.   rosuvastatin 5 MG tablet Commonly known as:  CRESTOR Take 1 tablet (5 mg total) by mouth daily.   SYSTANE OP Place 2 drops into both eyes daily.        DISCHARGE INSTRUCTIONS:   DIET:  Regular diet DISCHARGE CONDITION:  Good ACTIVITY:  Activity as tolerated OXYGEN:  Home Oxygen: No.  Oxygen Delivery: room air DISCHARGE LOCATION:  home   If you experience  worsening of your admission symptoms, develop shortness of breath, life threatening emergency, suicidal or homicidal thoughts you must seek medical attention immediately by calling 911 or calling your MD immediately  if symptoms less severe.  You Must read complete instructions/literature along with all the possible adverse reactions/side effects for all the Medicines you take and that have been prescribed to you. Take any new Medicines after you have completely understood and accpet all the possible adverse reactions/side effects.   Please note  You were cared for by a hospitalist during your hospital stay. If you have any questions about your discharge medications or the care you received while you were in the hospital after you are discharged, you can call the unit and asked to speak with the hospitalist on call if the hospitalist that took care of you  is not available. Once you are discharged, your primary care physician will handle any further medical issues. Please note that NO REFILLS for any discharge medications will be authorized once you are discharged, as it is imperative that you return to your primary care physician (or establish a relationship with a primary care physician if you do not have one) for your aftercare needs so that they can reassess your need for medications and monitor your lab values.    On the day of Discharge:  VITAL SIGNS:  Blood pressure 131/63, pulse 62, temperature (!) 97.3 F (36.3 C), temperature source Oral, resp. rate 11, height 5\' 3"  (1.6 m), weight 78.5 kg (173 lb), SpO2 100 %. PHYSICAL EXAMINATION:  GENERAL:  77 y.o.-year-old patient lying in the bed with no acute distress.  EYES: Pupils equal, round, reactive to light and accommodation. No scleral icterus. Extraocular muscles intact.  HEENT: Head atraumatic, normocephalic. Oropharynx and nasopharynx clear.  NECK:  Supple, no jugular venous distention. No thyroid enlargement, no tenderness.  LUNGS:  Normal breath sounds bilaterally, no wheezing, rales,rhonchi or crepitation. No use of accessory muscles of respiration.  CARDIOVASCULAR: S1, S2 normal. No murmurs, rubs, or gallops.  ABDOMEN: Soft, non-tender, non-distended. Bowel sounds present. No organomegaly or mass.  EXTREMITIES: No pedal edema, cyanosis, or clubbing.  NEUROLOGIC: Cranial nerves II through XII are intact. Muscle strength 5/5 in all extremities. Sensation intact. Gait not checked.  PSYCHIATRIC: The patient is alert and oriented x 3.  SKIN: No obvious rash, lesion, or ulcer.  DATA REVIEW:   CBC Recent Labs  Lab 01/15/18 0200  WBC 10.1  HGB 14.5  HCT 44.0  PLT 187    Chemistries  Recent Labs  Lab 01/13/18 1140 01/14/18 0513  NA 142 139  K 4.1 4.6  CL 109 109  CO2 26 20*  GLUCOSE 99 160*  BUN 12 16  CREATININE 0.70 0.53  CALCIUM 9.3 9.6  MG 1.9  --     Follow-up Information    Jerrol Banana., MD. Go on 01/27/2018.   Specialty:  Family Medicine Why:  Appointment Time: 3:00pm Contact information: [address] [place] [address] [phone]        Wellington Hampshire, MD. Go on 02/02/2018.   Specialty:  Cardiology Why:  Appointment Time:  2:30pm with Laurine Blazer Contact information: [place] [address] [phone]           Management plans discussed with the patient, family and they are in agreement.  CODE STATUS: Prior   TOTAL TIME TAKING CARE OF THIS PATIENT: 45 minutes.    Max Sane M.D on 01/17/2018 at 10:28 PM  Between 7am to 6pm - Pager - [phone]  After 6pm go to [email or web address] - password [email or web address] [facility]  [facility]  Office  [phone]  CC: Primary care physician; Jerrol Banana., MD   Note: This dictation was prepared with Dragon dictation along with smaller phrase technology. Any transcriptional errors that result from this process are unintentional.

## 2018-01-18 ENCOUNTER — Encounter: Payer: Self-pay | Admitting: Cardiovascular Disease

## 2018-01-18 ENCOUNTER — Ambulatory Visit: Admission: RE | Admit: 2018-01-18 | Payer: Medicare HMO | Source: Ambulatory Visit | Admitting: Cardiovascular Disease

## 2018-01-18 ENCOUNTER — Encounter: Admission: RE | Payer: Self-pay | Source: Ambulatory Visit

## 2018-01-18 ENCOUNTER — Telehealth: Payer: Self-pay | Admitting: Physician Assistant

## 2018-01-18 SURGERY — RIGHT/LEFT HEART CATH AND CORONARY ANGIOGRAPHY
Anesthesia: Moderate Sedation | Laterality: Bilateral

## 2018-01-18 NOTE — Telephone Encounter (Signed)
Spoke with patient in details. She is having anxiety and crying. Advised patient that we have sooner appointments and I do not think anything can be called in for anxiety without her been seen. Patient states she just called Dr Tyrell Antonio office to see if they can help her and will let us know if that route does not work and/or if she needs [place]. Patient states she does not even know if she can make it over here to come over with how she feels " nervous, anxious, crying on the phone." I did ask if something was contributing to these feelings but she said no.-Violanda Bobeck V Jina Olenick, RMA

## 2018-01-18 NOTE — Telephone Encounter (Signed)
LMTCB to speak to patient again and see if she would change her mind and be seen sooner-Anastasiya Estell Harpin, RMA

## 2018-01-18 NOTE — Telephone Encounter (Signed)
This has been addressed. See phone note

## 2018-01-18 NOTE — Telephone Encounter (Signed)
Pt states she needs something to "calm me down". Pt staes she has felt this way since she was taken to the ED on Wednesday . Please call to discuss.

## 2018-01-18 NOTE — Telephone Encounter (Signed)
Patient contacted regarding discharge from [facility] on January 25.  Patient understands to follow up with provider Christell Faith, PA-C on Feb 12 at 2:30pm at [facility]. Patient understands discharge instructions? yes Patient understands medications and regiment? yes Patient understands to bring all medications to this visit? yes  Pt reports nerves are bothering her and she has not been sleeping well since discharge. She asks if MD can prescribe medication to calm her down. Advised she would need to contact PCP for anti-anxiety medication.  Reports she can't breathe, abdomen hurting, esophagus pain possibly related to TEE. No signs of blood. She does not sound in distress while on the phone.  She does not have BP cuff or pulse ox at home. Advised to purchase both today and monitor VS.  Pt will call back if VS elevated or oxygen level is low. Moved TCM visit to January 29 with Ignacia Bayley, NP. Pt agreeable and appreciative.  She understands if sx persist or worsen, she may go to the ED for evaluation.

## 2018-01-18 NOTE — Telephone Encounter (Signed)
Attempted TCM call. Left message on cell VM to contact the office.

## 2018-01-18 NOTE — Telephone Encounter (Signed)
Transition Care Management Follow-Up Telephone Call   Date discharged and where: [facility] on 01/15/18  How have you been since you were released from the hospital? Not good, cant breath, feels like she's choking at times. Pt states her nerves are torn. Chest pain is better but pt states it wont be if she cannot calm down. No meds dispensed for issue. SOB is new is a new symptom and pt states that it is stressing her out. Pt has a hx of asthma and is using Symbicort PRN. Pt is burping more and feels like her food is not going down.  Denies n/v/d or fever.   Any patient concerns? SOB and feeling anxious.  Items Reviewed:   Meds: verified  Allergies: verified  Dietary Changes Reviewed: heart healthy  Functional Questionnaire:  Independent-I Dependent-D  ADLs:   Dressing- I    Eating- I   Maintaining continence- I   Transferring-I   Transportation- I   Meal Prep- I   Managing Meds- I  Confirmed importance and Date/Time of follow-up visits scheduled: 01/27/18 @ 3:00 PM. Asked pt about a sooner apt due to symptoms and pt declined.   Confirmed with patient if condition worsens to call PCP or go to the Emergency Dept. Patient was given office number and encouraged to call back with questions or concerns: YES

## 2018-01-19 ENCOUNTER — Encounter: Payer: Self-pay | Admitting: Nurse Practitioner

## 2018-01-19 ENCOUNTER — Ambulatory Visit: Payer: Medicare HMO | Admitting: Nurse Practitioner

## 2018-01-19 VITALS — BP 144/70 | HR 61 | Ht 64.0 in | Wt 183.5 lb

## 2018-01-19 DIAGNOSIS — E782 Mixed hyperlipidemia: Secondary | ICD-10-CM

## 2018-01-19 DIAGNOSIS — I428 Other cardiomyopathies: Secondary | ICD-10-CM | POA: Diagnosis not present

## 2018-01-19 DIAGNOSIS — I2510 Atherosclerotic heart disease of native coronary artery without angina pectoris: Secondary | ICD-10-CM

## 2018-01-19 DIAGNOSIS — I493 Ventricular premature depolarization: Secondary | ICD-10-CM | POA: Diagnosis not present

## 2018-01-19 DIAGNOSIS — I480 Paroxysmal atrial fibrillation: Secondary | ICD-10-CM | POA: Diagnosis not present

## 2018-01-19 MED ORDER — AMIODARONE HCL 200 MG PO TABS: 200.0000 mg | ORAL_TABLET | Freq: Every day | ORAL | 2 refills | Status: DC

## 2018-01-19 NOTE — Progress Notes (Signed)
Office Visit    Patient Name: Teresa [name] Date of Encounter: 01/19/2018  Primary Care Provider:  Jerrol Banana., MD Primary Cardiologist:  Kathlyn Sacramento, MD  Chief Complaint    77 year old female with a history of CAD status post CABG x1, hypertension, hyperlipidemia, frequent PVCs, chronic pain, and recent diagnosis of paroxysmal atrial fibrillation status post cardioversion, who presents for follow-up.  Past Medical History    Past Medical History:  Diagnosis Date  . Arthritis   . Asthma   . Atrial fibrillation ([place])    a. diagnosed 01/13/2018; b. CHADS2VASc = 6 --> Eliquis; c. 12/2017 s/p TEE/DCCV.  . Back pain   . Breast cancer ([place]) 2009   left breast   . CAD (coronary artery disease)    a. 1994 s/p CABG x 1 (LIMA->LAD); b. 03/2015 MV: No ischemia; c. MV 11/18: small fixed apical defect likely secondary to breast attenuation, EF of 42%, frequent PVCs; d. 12/2017 Cath: LM nl, lAD 20p, D1/2/3 nl, LCX min irregs, OM1/2/3 min irregs, RCA nl, RPDA nl, RPL1/2 nl, LIMA->LAD atretic.  Marland Kitchen Dental crowns present    caps- left back top, right back bottom  . Gastroesophageal reflux disease   . Hypertension   . Ischemic cardiomyopathy    a. 2013 EF 40%;  b. 03/2015 Echo: EF 55-60%, mild MR, mod dil LA, nl RV fxn; c. TTE 12/18: EF of 35-40%, hypokinesis of the anteroseptal, and apical myocardium, normal LV diastolic function parameters, mild to moderate mitral regurgitation, moderately dilated left atrium measuring 48 mm, normal RV systolic function, moderately elevated pulmonary arterial pressure measuring 53 mmHg  . MI (myocardial infarction) ([place]) 1994  . Personal history of radiation therapy   . Psoriasis   . PSVT (paroxysmal supraventricular tachycardia) ([place])    a. 02/2015 Holter: short runs of SVT and NSVT.  Marland Kitchen Pulmonary hypertension ([place])   . PVC's (premature ventricular contractions)   . Rheumatoid arthritis ([facility])    feet, hands  . Vertigo    approx 2x/yr   Past  Surgical History:  Procedure Laterality Date  . ABDOMINAL HYSTERECTOMY    . back fusion    . BREAST BIOPSY Right    1991 negative  . BREAST EXCISIONAL BIOPSY Left    2009 positive  . BREAST LUMPECTOMY    . [place] N/A 01/15/2018   Procedure: CARDIOVERSION;  Surgeon: Wellington Hampshire, MD;  Location: [facility];  Service: Cardiovascular;  Laterality: N/A;  . CHOLECYSTECTOMY    . CORONARY ARTERY BYPASS GRAFT  1994   1 vessel - [facility]  . FOOT ARTHRODESIS Right 07/24/2016   Procedure: FUSION FIRST METATARSAL CUNEIFORM JOINT RIGHT FOOT, FUSION SECOND METATARSAL CUNEFORM JOINT BUNION REPAIR RIGHT FOOT;  Surgeon: Albertine Patricia, DPM;  Location: [place];  Service: Podiatry;  Laterality: Right;  . FOOT FRACTURE SURGERY    . HARDWARE REMOVAL Right 07/24/2016   Procedure: REMOVAL HARDWARE LATERAL MALLEOUS RIGHT ANKLE;  Surgeon: Albertine Patricia, DPM;  Location: [place];  Service: Podiatry;  Laterality: Right;  REMOVAL OF PIN WHICH WAS INTACT  . HERNIA REPAIR    . RIGHT/LEFT HEART CATH AND CORONARY/GRAFT ANGIOGRAPHY N/A 01/14/2018   Procedure: LEFT HEART CATH AND CORONARY ANGIOGRAPHY;  Surgeon: Wellington Hampshire, MD;  Location: [facility];  Service: Cardiovascular;  Laterality: N/A;  . TEE WITHOUT CARDIOVERSION N/A 01/15/2018   Procedure: TRANSESOPHAGEAL  ECHOCARDIOGRAM (TEE);  Surgeon: Wellington Hampshire, MD;  Location: [facility];  Service: Cardiovascular;  Laterality: N/A;  . TOTAL HIP ARTHROPLASTY Left   . TOTAL KNEE ARTHROPLASTY Left     Allergies  Allergies  Allergen Reactions  . Sulfa Antibiotics Hives  . Ivp Dye [Iodinated Diagnostic Agents] Swelling  . Codeine Other (See Comments)    ALTERED MENTAL STATUS    History of Present Illness    77 year old female with the above complex past medical history including coronary artery disease status post CABG x1 in 1994.  Other history includes  ischemic cardiomyopathy with normalization of LV function by echo in April 2016, hypertension, hyperlipidemia, PSVT, frequent PVCs, breast cancer, and chronic pain.  In the late fall 2018, she was seen in clinic with somewhat atypical substernal chest discomfort that occurred exclusively with emotional upset.  She underwent stress testing which showed a small fixed apical defect likely secondary to breast attenuation with an EF of 42%.  Because of reduced EF on stress testing, a follow-up echo was undertaken and confirmed LV dysfunction with an EF of 35-40%.  Given cardiomyopathy with history of frequent PVCs, she followed up in clinic and was set up for diagnostic catheterization as well as Holter monitoring.  She presented on January 23 to pick up her Holter monitor and reported palpitations that began the night before.  She was found to be in rapid atrial fibrillation.  She was referred for admission and subsequently underwent diagnostic catheterization revealing minimal nonobstructive CAD with an atretic LIMA to the LAD.  She then underwent TEE followed by cardioversion.  Of note, she was started on amiodarone prior to cardioversion and was subsequently discharged home on a loading dose.  Following discharge, she noted poor appetite and mild nausea which worsened on the evening of January 28 and became associated with shaking and several episodes of vomiting.  As result, she contacted the office for an appointment today.  She has not had any recurrent nausea today and was able to tolerate breakfast.  She has not yet taken her medicines this morning however.  She has not had any chest pain, dyspnea, PND, orthopnea, dizziness, syncope, edema, palpitations, or early satiety.  Home Medications    Prior to Admission medications   Medication Sig Start Date End Date Taking? Authorizing Provider  acetaminophen (TYLENOL) 325 MG tablet Take 650 mg by mouth every 6 (six) hours as needed for moderate pain or headache.    Yes [provider]  amiodarone (PACERONE) 200 MG tablet Take 2 tablets (400 mg total) by mouth 2 (two) times daily. Taper to 200 mg PO bid after 5 days 01/15/18 02/14/18 Yes Max Sane, MD  apixaban (ELIQUIS) 5 MG TABS tablet Take 1 tablet (5 mg total) by mouth 2 (two) times daily. 01/15/18  Yes Max Sane, MD  Apremilast (OTEZLA) 30 MG TABS Take 30 mg by mouth 2 (two) times daily.   Yes [provider]  aspirin EC 81 MG tablet Take 1 tablet (81 mg total) by mouth daily. 03/03/16  Yes Wellington Hampshire, MD  budesonide-formoterol (SYMBICORT) 160-4.5 MCG/ACT inhaler Inhale 2 puffs into the lungs 2 (two) times daily. Patient taking differently: Inhale 2 puffs into the lungs 2 (two) times daily as needed (for shortness of breath).  10/06/16  Yes Jerrol Banana., MD  diphenhydrAMINE (BENADRYL) 25 MG tablet Take 25 mg by mouth every 6 (six) hours as needed for allergies.    Yes [provider]  meclizine (  ANTIVERT) 25 MG tablet Take 25 mg by mouth 3 (three) times daily as needed for dizziness.  04/03/15  Yes [provider]  meloxicam (MOBIC) 7.5 MG tablet Take 1 tablet (7.5 mg total) by mouth daily. Patient taking differently: Take 7.5 mg by mouth daily as needed for pain.  11/18/17  Yes Jerrol Banana., MD  metoprolol succinate (TOPROL-XL) 50 MG 24 hr tablet Take 1 tablet (50 mg total) by mouth daily. 01/15/18  Yes Max Sane, MD  montelukast (SINGULAIR) 10 MG tablet Take 10 mg by mouth daily as needed (for allergies).  04/02/15  Yes [provider]  Multiple Vitamins-Minerals (MULTIVITAMIN PO) Take 1 tablet by mouth daily.   Yes [provider]  Polyethyl Glycol-Propyl Glycol (SYSTANE OP) Place 2 drops into both eyes daily.    Yes [provider]  rosuvastatin (CRESTOR) 5 MG tablet Take 1 tablet (5 mg total) by mouth daily. 08/25/17 01/19/18 Yes Wellington Hampshire, MD    Review of Systems    Nausea and early satiety as outlined  above.  All other systems reviewed and are otherwise negative except as noted above.  Physical Exam    VS:  BP (!) 144/70 (BP Location: Right Arm, Patient Position: Sitting, Cuff Size: Normal)   Pulse 61   Ht 5\' 4"  (1.626 m)   Wt 183 lb 8 oz (83.2 kg)   BMI 31.50 kg/m  , BMI Body mass index is 31.5 kg/m. GEN: Well nourished, well developed, in no acute distress.  HEENT: normal.  Neck: Supple, no JVD, carotid bruits, or masses. Cardiac: RRR, 2/6 systolic murmur throughout, no rubs, or gallops. No clubbing, cyanosis, edema.  Radials/DP/PT 2+ and equal bilaterally.  Respiratory:  Respirations regular and unlabored, clear to auscultation bilaterally. GI: Soft, nontender, nondistended, BS + x 4. MS: no deformity or atrophy. Skin: warm and dry, no rash. Neuro:  Strength and sensation are intact. Psych: Normal affect.  Accessory Clinical Findings    ECG -regular sinus rhythm, 61, left axis deviation, left bundle branch block, no acute changes.  Assessment & Plan    1.  Paroxysmal atrial fibrillation: Patient was recently admitted in the setting of rapid atrial fibrillation.  During admission, she was placed on amiodarone and subsequently underwent TEE and cardioversion.  This was successful and she was discharged home on amiodarone and Eliquis.  She has been having some nausea and anorexia over the weekend and had vomiting last night.  She has not been having any fevers or diarrhea.  She is concerned that her medications may be causing her symptoms.  I share her concern that amiodarone may be the culprit.  I have asked her to reduce amiodarone to 200 mg daily to see if she can tolerate a lower dose.  If not, we will have to discontinue this and consider an alternate antiarrhythmic option once amiodarone has cleared her system.  She is to remain on Eliquis therapy as I do not think that this would be contributing to nausea.  CHA2DS2VASc equals 6.  2.  Coronary artery disease with history of  chest pain: She underwent diagnostic catheterization during hospitalization which did not show any significant obstructive disease.  Her LIMA to the LAD was atretic however, the LAD itself only had minimal disease.  She remains on medical therapy and has not had any chest pain over the weekend.  Continue beta-blocker and statin.  Discontinuing aspirin in the setting of Eliquis.  3.  Hyperlipidemia: She remains on low-dose  rosuvastatin and is tolerating this.  LDL was 80 in July 2018.  LFTs normal in October 2018.  4.  PVCs: Patient with history of frequent PVCs.  In the setting of LV dysfunction, EP referral was previously made.  We will see if she can tolerate amiodarone.  5.  Mixed ICM/NICM:  Euvolemic.  Cont  blocker.  Will need to look to add ARB therapy, but I am reluctant to do this today given concerns that meds may be causing nausea.  6.  Disposition: She has follow-up in about 3 weeks.  She will contact us if nausea persists even on low-dose amiodarone.   Murray Hodgkins, NP 01/19/2018, 3:16 PM

## 2018-01-19 NOTE — Patient Instructions (Signed)
Medication Instructions:  Your physician has recommended you make the following change in your medication:  1- STOP Aspirin. 2- DECREASE Amiodarone to 200 mg by mouth once a day.   Labwork: none  Testing/Procedures: none  Follow-Up: Your physician recommends that you schedule a follow-up appointment: KEEP FOLLOW UP APPOINTMENTS AS SCHEDULED.  - Call our office if you continue to experience nausea.     If you need a refill on your cardiac medications before your next appointment, please call your pharmacy.

## 2018-01-22 NOTE — Addendum Note (Signed)
Addended by: Britt Bottom on: 01/22/2018 08:33 AM   Modules accepted: Orders

## 2018-01-26 ENCOUNTER — Telehealth: Payer: Self-pay | Admitting: Nurse Practitioner

## 2018-01-26 NOTE — Telephone Encounter (Signed)
I suspect that she is right.  If she can't tolerate 100 mg daily, she will need to d/c.

## 2018-01-26 NOTE — Telephone Encounter (Signed)
Spoke with patient and she states that since decreasing her amiodarone to once daily she is still having symptoms. Her daughter checked with pharmacy and they advised to take Amiodarone 200 mg 1/2 tablet once daily. She is having diarrhea, coughing which she can't control, and she strongly feels this is causing her symptoms. She also has had decrease in her energy. She insisted that this medication is causing her symptoms and she just doesn't know what to do. Advised that I would check with Gerald Stabs NP and then we would be in touch with any recommendations.

## 2018-01-26 NOTE — Telephone Encounter (Signed)
Patient states she is still having complications from medication she was prescribed  She would like to come in to see Dr Fletcher Anon sooner Please call to discuss  Please call cell phone as she will not be at home

## 2018-01-27 ENCOUNTER — Ambulatory Visit (INDEPENDENT_AMBULATORY_CARE_PROVIDER_SITE_OTHER): Payer: Medicare HMO | Admitting: Family Medicine

## 2018-01-27 ENCOUNTER — Encounter: Payer: Self-pay | Admitting: Family Medicine

## 2018-01-27 VITALS — BP 130/74 | HR 52 | Temp 98.0°F | Resp 16 | Wt 177.0 lb

## 2018-01-27 DIAGNOSIS — I200 Unstable angina: Secondary | ICD-10-CM

## 2018-01-27 DIAGNOSIS — R69 Illness, unspecified: Secondary | ICD-10-CM | POA: Diagnosis not present

## 2018-01-27 DIAGNOSIS — F419 Anxiety disorder, unspecified: Secondary | ICD-10-CM | POA: Diagnosis not present

## 2018-01-27 DIAGNOSIS — I4891 Unspecified atrial fibrillation: Secondary | ICD-10-CM

## 2018-01-27 MED ORDER — LORAZEPAM 0.5 MG PO TABS: 0.5000 mg | ORAL_TABLET | Freq: Two times a day (BID) | ORAL | 1 refills | Status: DC | PRN

## 2018-01-27 NOTE — Progress Notes (Signed)
Patient: Teresa [name] Female    DOB: 26-Apr-1941   77 y.o.   MRN: [id number] Visit Date: 01/27/2018  Today's Provider: [name], MD   Chief Complaint  Patient presents with  . Hospitalization Follow-up   Subjective:    HPI     Follow up Hospitalization  Patient was admitted to [facility] on 01/13/2018 and discharged on 01/15/2018. She was treated for A-fib with rapid ventricular response and unstable angina. Treatment for this included heart cath and cardioversion.  Telephone follow up was done on 01/15/2018. Pt states amiodarone caused nausea, decreased appetite, and cough. Cardiology tried decreasing this medication to 200 mg once daily, then decreased this again to 100 mg qd. SHe still could not tolerate medication at this dose, Her pharmacist advised her to cut the tablet into 4ths. She states the cardiologist advised pt D/C the medication this morning, and pt states this is improved. She has a F/U with cardiology on 02/02/2018, and will discuss alternative medications at that time. She reports this condition is Improved. She is still c/o weakness, cough.   ------------------------------------------------------------------------------------     Allergies  Allergen Reactions  . Sulfa Antibiotics Hives  . Ivp Dye [Iodinated Diagnostic Agents] Swelling  . Amiodarone Nausea And Vomiting and Cough  . Codeine Other (See Comments)    ALTERED MENTAL STATUS     Current Outpatient Medications:  .  acetaminophen (TYLENOL) 325 MG tablet, Take 650 mg by mouth every 6 (six) hours as needed for moderate pain or headache., Disp: , Rfl:  .  apixaban (ELIQUIS) 5 MG TABS tablet, Take 1 tablet (5 mg total) by mouth 2 (two) times daily., Disp: 60 tablet, Rfl: 0 .  Apremilast (OTEZLA) 30 MG TABS, Take 30 mg by mouth 2 (two) times daily., Disp: , Rfl:  .  budesonide-formoterol (SYMBICORT) 160-4.5 MCG/ACT inhaler, Inhale 2 puffs into the lungs 2 (two) times daily. (Patient taking  differently: Inhale 2 puffs into the lungs 2 (two) times daily as needed (for shortness of breath). ), Disp: 1 Inhaler, Rfl: 3 .  diphenhydrAMINE (BENADRYL) 25 MG tablet, Take 25 mg by mouth every 6 (six) hours as needed for allergies. , Disp: , Rfl:  .  meclizine (ANTIVERT) 25 MG tablet, Take 25 mg by mouth 3 (three) times daily as needed for dizziness. , Disp: , Rfl: 1 .  meloxicam (MOBIC) 7.5 MG tablet, Take 1 tablet (7.5 mg total) by mouth daily. (Patient taking differently: Take 7.5 mg by mouth daily as needed for pain. ), Disp: 30 tablet, Rfl: 3 .  metoprolol succinate (TOPROL-XL) 50 MG 24 hr tablet, Take 1 tablet (50 mg total) by mouth daily., Disp: 30 tablet, Rfl: 0 .  montelukast (SINGULAIR) 10 MG tablet, Take 10 mg by mouth daily as needed (for allergies). , Disp: , Rfl: 12 .  Multiple Vitamins-Minerals (MULTIVITAMIN PO), Take 1 tablet by mouth daily., Disp: , Rfl:  .  Polyethyl Glycol-Propyl Glycol (SYSTANE OP), Place 2 drops into both eyes daily. , Disp: , Rfl:  .  rosuvastatin (CRESTOR) 5 MG tablet, Take 1 tablet (5 mg total) by mouth daily., Disp: 90 tablet, Rfl: 3  Review of Systems  Constitutional: Positive for activity change and fatigue. Negative for appetite change, chills, diaphoresis, fever and unexpected weight change.  HENT: Negative.   Eyes: Negative.   Respiratory: Positive for cough and shortness of breath.   Cardiovascular: Negative for chest pain, palpitations and leg swelling.  Gastrointestinal: Negative.  Endocrine: Negative.   Allergic/Immunologic: Negative.   Neurological: Negative.   Psychiatric/Behavioral: Negative.     Social History   Tobacco Use  . Smoking status: Former Smoker    Packs/day: 0.50    Years: 35.00    Pack years: 17.50    Types: Cigarettes  . Smokeless tobacco: Former Systems developer    Quit date: 12/22/1978  . Tobacco comment: Quit approx 1990  Substance Use Topics  . Alcohol use: No   Objective:   BP 130/74 (BP Location: Right Arm,  Patient Position: Sitting, Cuff Size: Normal)   Pulse (!) 52   Temp 98 F (36.7 C) (Oral)   Resp 16   Wt 177 lb (80.3 kg)   SpO2 98%   BMI 30.38 kg/m  Vitals:   01/27/18 1510  BP: 130/74  Pulse: (!) 52  Resp: 16  Temp: 98 F (36.7 C)  TempSrc: Oral  SpO2: 98%  Weight: 177 lb (80.3 kg)     Physical Exam  Constitutional: She is oriented to person, place, and time. She appears well-developed and well-nourished.  HENT:  Head: Normocephalic and atraumatic.  Eyes: Conjunctivae are normal.  Neck: Normal range of motion. No thyromegaly present.  Cardiovascular: Regular rhythm and normal heart sounds. Bradycardia present.  No carotid bruit  Pulmonary/Chest: Effort normal and breath sounds normal. No respiratory distress.  Abdominal: Soft.  Neurological: She is alert and oriented to person, place, and time.  Skin: Skin is warm and dry.  Psychiatric: She has a normal mood and affect. Her behavior is normal.        Assessment & Plan:     1. Atrial fibrillation with rapid ventricular response ([place]) FU with cardiology as scheduled. Continue current medications. FU 4 weeks.  2. Unstable angina ([facility]) Improving. FU With cardiology as scheduled.  3. Anxiety Not to goal. Add Lorazepam as below. FU 4 weeks. Pt to call if she starts experiences side effects. - LORazepam (ATIVAN) 0.5 MG tablet; Take 1 tablet (0.5 mg total) by mouth 2 (two) times daily as needed for anxiety.  Dispense: 60 tablet; Refill: 1     Patient seen and examined by Miguel Aschoff, MD, and note scribed by Martha Clan, CMA. I have done the exam and reviewed the above chart and it is accurate to the best of my knowledge. Development worker, community has been used in this note in any air is in the dictation or transcription are unintentional.  [name], MD  [place]

## 2018-01-27 NOTE — Telephone Encounter (Signed)
Spoke with patient and reviewed Gerald Stabs NP's recommendations to stop if she is unable to tolerate the amiodarone. She verbalized understanding and confirmed upcoming appointment with no further questions at this time.

## 2018-02-02 ENCOUNTER — Ambulatory Visit: Payer: Medicare HMO | Admitting: Physician Assistant

## 2018-02-03 ENCOUNTER — Ambulatory Visit: Payer: Medicare HMO | Admitting: Nurse Practitioner

## 2018-02-03 ENCOUNTER — Ambulatory Visit
Admission: RE | Admit: 2018-02-03 | Discharge: 2018-02-03 | Disposition: A | Discharge status: Home / Self Care | Payer: Medicare HMO | Source: Ambulatory Visit | Attending: Nurse Practitioner | Admitting: Nurse Practitioner

## 2018-02-03 ENCOUNTER — Encounter: Payer: Self-pay | Admitting: Nurse Practitioner

## 2018-02-03 VITALS — BP 130/70 | HR 55 | Ht 64.0 in | Wt 175.5 lb

## 2018-02-03 DIAGNOSIS — I2510 Atherosclerotic heart disease of native coronary artery without angina pectoris: Secondary | ICD-10-CM | POA: Diagnosis not present

## 2018-02-03 DIAGNOSIS — R059 Cough, unspecified: Secondary | ICD-10-CM

## 2018-02-03 DIAGNOSIS — I480 Paroxysmal atrial fibrillation: Secondary | ICD-10-CM | POA: Diagnosis not present

## 2018-02-03 DIAGNOSIS — E782 Mixed hyperlipidemia: Secondary | ICD-10-CM

## 2018-02-03 DIAGNOSIS — R05 Cough: Secondary | ICD-10-CM | POA: Insufficient documentation

## 2018-02-03 DIAGNOSIS — I428 Other cardiomyopathies: Secondary | ICD-10-CM

## 2018-02-03 DIAGNOSIS — I493 Ventricular premature depolarization: Secondary | ICD-10-CM

## 2018-02-03 NOTE — Progress Notes (Signed)
Office Visit    Patient Name: Teresa [name] Date of Encounter: 02/03/2018  Primary Care Provider:  Jerrol Banana., MD Primary Cardiologist:  Kathlyn Sacramento, MD  Chief Complaint    77 year old female with a history of CAD status post CABG x1, hypertension, hyperlipidemia, frequent PVCs, chronic pain, and recent diagnosis of paroxysmal atrial fibrillation status post cardioversion who presents for follow-up after having to discontinue amiodarone secondary to anorexia and nausea.  Past Medical History    Past Medical History:  Diagnosis Date  . Arthritis   . Asthma   . Back pain   . Breast cancer ([place]) 2009   left breast   . CAD (coronary artery disease)    a. 1994 s/p CABG x 1 (LIMA->LAD); b. 03/2015 MV: No ischemia; c. MV 11/18: small fixed apical defect likely secondary to breast attenuation, EF of 42%, frequent PVCs; d. 12/2017 Cath: LM nl, lAD 20p, D1/2/3 nl, LCX min irregs, OM1/2/3 min irregs, RCA nl, RPDA nl, RPL1/2 nl, LIMA->LAD atretic.  Marland Kitchen Dental crowns present    caps- left back top, right back bottom  . Gastroesophageal reflux disease   . Hypertension   . MI (myocardial infarction) ([place]) 1994  . Mixed Ischemic & Nonischemic cardiomyopathy    a. 2013 EF 40%;  b. 03/2015 Echo: EF 55-60%, mild MR, mod dil LA, nl RV fxn; c. 12/18 Echo: EF of 35-40%, hypokinesis of the anteroseptal, and apical myocardium, mild to mod MR, mod dil LA, nl RV fxn, PASP 53 mmHg; d. 12/2017 TEE: EF 35-40%, diff HK, mild to mod MR. small PFO. No LAA/RAA thrombus.  Marland Kitchen PAF (paroxysmal atrial fibrillation) ([place])    a. diagnosed 01/13/2018; b. CHADS2VASc = 6 --> Eliquis; c. 12/2017 s/p TEE/DCCV. Amio started but d/c'd 01/2018 2/2 n/anorexia.  . Personal history of radiation therapy   . Psoriasis   . PSVT (paroxysmal supraventricular tachycardia) ([place])    a. 02/2015 Holter: short runs of SVT and NSVT.  Marland Kitchen Pulmonary hypertension ([place])   . PVC's (premature ventricular contractions)   . Rheumatoid  arthritis (HCC)    feet, hands  . Vertigo    approx 2x/yr   Past Surgical History:  Procedure Laterality Date  . ABDOMINAL HYSTERECTOMY    . back fusion    . BREAST BIOPSY Right    1991 negative  . BREAST EXCISIONAL BIOPSY Left    2009 positive  . BREAST LUMPECTOMY    . Page N/A 01/15/2018   Procedure: CARDIOVERSION;  Surgeon: Wellington Hampshire, MD;  Location: [facility];  Service: Cardiovascular;  Laterality: N/A;  . CHOLECYSTECTOMY    . CORONARY ARTERY BYPASS GRAFT  1994   1 vessel - [facility]  . FOOT ARTHRODESIS Right 07/24/2016   Procedure: FUSION FIRST METATARSAL CUNEIFORM JOINT RIGHT FOOT, FUSION SECOND METATARSAL CUNEFORM JOINT BUNION REPAIR RIGHT FOOT;  Surgeon: Albertine Patricia, DPM;  Location: [facility];  Service: Podiatry;  Laterality: Right;  . FOOT FRACTURE SURGERY    . HARDWARE REMOVAL Right 07/24/2016   Procedure: REMOVAL HARDWARE LATERAL MALLEOUS RIGHT ANKLE;  Surgeon: Albertine Patricia, DPM;  Location: [facility];  Service: Podiatry;  Laterality: Right;  REMOVAL OF PIN WHICH WAS INTACT  . HERNIA REPAIR    . RIGHT/LEFT HEART CATH AND CORONARY/GRAFT ANGIOGRAPHY N/A 01/14/2018   Procedure: LEFT HEART CATH AND CORONARY ANGIOGRAPHY;  Surgeon: Wellington Hampshire, MD;  Location: [facility];  Service: Cardiovascular;  Laterality: N/A;  . TEE WITHOUT CARDIOVERSION N/A 01/15/2018   Procedure: TRANSESOPHAGEAL ECHOCARDIOGRAM (TEE);  Surgeon: Wellington Hampshire, MD;  Location: [facility];  Service: Cardiovascular;  Laterality: N/A;  . TOTAL HIP ARTHROPLASTY Left   . TOTAL KNEE ARTHROPLASTY Left     Allergies  Allergies  Allergen Reactions  . Sulfa Antibiotics Hives  . Ivp Dye [Iodinated Diagnostic Agents] Swelling  . Amiodarone Nausea And Vomiting and Cough  . Codeine Other (See Comments)    ALTERED MENTAL STATUS    History of Present Illness    77 year old female with the above  complex past medical history including coronary artery disease status post CABG x1 in 1994.  Other history includes mixed ischemic and nonischemic cardiomyopathy with normalization of LV function by echo in April 2016, hypertension, hyperlipidemia, PSVT, frequent PVCs, breast cancer, and chronic pain.  In the late fall 2018, she was seen with somewhat atypical chest discomfort that was occurring exclusively with emotional upset.  She underwent stress testing which showed a small fixed apical defect likely secondary to breast attenuation with an EF of 42%.  Given reduced EF, an echocardiogram was undertaken and confirmed LV dysfunction with an EF of 35-40%.  With prior history of cardia myopathy and frequent PVCs, she was seen in clinic with plan for diagnostic catheterization as well as Holter monitoring.  On January 23, when she presented to pick up her Holter monitor she reported palpitations that started the night before was found to be in rapid atrial fibrillation.  She was admitted and anticoagulated.  She underwent catheterization which showed an atretic LIMA to the LAD with otherwise minimal, nonobstructive CAD.  Heart rates were difficult to control and she was placed on amiodarone therapy.  She then underwent TEE and cardioversion, which was successful.  She was transitioned over to oral Eliquis for anticoagulation.    Following discharge, she noted anorexia and nausea and was seen in the emergency department with benign workup.  When I saw her in clinic on January 29, I had concerns that her amiodarone was causing her symptoms and her dose was reduced to 200 mg daily.  She continued to have nausea and anorexia and we reduced her dose further to 100 mg daily over the phone and then subsequently discontinued altogether 1 week ago.  Since discontinuation of amiodarone, she has had complete resolution of nausea and anorexia.  Appetite is improved and she is feeling much better.  She denies any chest pain or  significant dyspnea.  She has not been having any palpitations and is in sinus rhythm today.  She denies PND, orthopnea, dizziness, syncope, edema, or early satiety.  She has had a nagging cough over the past week or so.  This is nonproductive.  She denies any fevers or chills.  Home Medications    Prior to Admission medications   Medication Sig Start Date End Date Taking? Authorizing Provider  acetaminophen (TYLENOL) 325 MG tablet Take 650 mg by mouth every 6 (six) hours as needed for moderate pain or headache.   Yes [provider]  apixaban (ELIQUIS) 5 MG TABS tablet Take 1 tablet (5 mg total) by mouth 2 (two) times daily. 01/15/18  Yes Max Sane, MD  Apremilast (OTEZLA) 30 MG TABS Take 30 mg by mouth 2 (two) times daily.   Yes [provider]  budesonide-formoterol (SYMBICORT) 160-4.5 MCG/ACT inhaler Inhale 2 puffs into the lungs 2 (two) times daily. Patient taking  differently: Inhale 2 puffs into the lungs 2 (two) times daily as needed (for shortness of breath).  10/06/16  Yes Jerrol Banana., MD  diphenhydrAMINE (BENADRYL) 25 MG tablet Take 25 mg by mouth every 6 (six) hours as needed for allergies.    Yes [provider]  LORazepam (ATIVAN) 0.5 MG tablet Take 1 tablet (0.5 mg total) by mouth 2 (two) times daily as needed for anxiety. 01/27/18  Yes Jerrol Banana., MD  meclizine (ANTIVERT) 25 MG tablet Take 25 mg by mouth 3 (three) times daily as needed for dizziness.  04/03/15  Yes [provider]  meloxicam (MOBIC) 7.5 MG tablet Take 1 tablet (7.5 mg total) by mouth daily. Patient taking differently: Take 7.5 mg by mouth daily as needed for pain.  11/18/17  Yes Jerrol Banana., MD  metoprolol succinate (TOPROL-XL) 50 MG 24 hr tablet Take 1 tablet (50 mg total) by mouth daily. 01/15/18  Yes Max Sane, MD  montelukast (SINGULAIR) 10 MG tablet Take 10 mg by mouth daily as needed (for allergies).  04/02/15  Yes [provider]    Multiple Vitamins-Minerals (MULTIVITAMIN PO) Take 1 tablet by mouth daily.   Yes [provider]  Polyethyl Glycol-Propyl Glycol (SYSTANE OP) Place 2 drops into both eyes daily.    Yes [provider]  rosuvastatin (CRESTOR) 5 MG tablet Take 1 tablet (5 mg total) by mouth daily. 08/25/17 02/03/18 Yes Wellington Hampshire, MD    Review of Systems    As above, she has been feeling better since off of amiodarone.  She is no longer experiencing anorexia or nausea.  She has been having a nonproductive, nagging cough over the past 10 days.  She denies chest pain, palpitations, dyspnea, PND, orthopnea, dizziness, syncope, edema, or early satiety.  All other systems reviewed and are otherwise negative except as noted above.  Physical Exam    VS:  BP 130/70 (BP Location: Left Arm, Patient Position: Sitting, Cuff Size: Normal)   Pulse (!) 55   Ht 5\' 4"  (1.626 m)   Wt 175 lb 8 oz (79.6 kg)   BMI 30.12 kg/m  , BMI Body mass index is 30.12 kg/m. GEN: Well nourished, well developed, in no acute distress.  HEENT: normal.  Neck: Supple, no JVD, carotid bruits, or masses. Cardiac: RRR, 2/6 systolic murmur throughout, no rubs, or gallops. No clubbing, cyanosis, edema.  Radials/DP/PT 2+ and equal bilaterally.  Respiratory:  Respirations regular and unlabored, clear to auscultation bilaterally. GI: Soft, nontender, nondistended, BS + x 4. MS: no deformity or atrophy. Skin: warm and dry, no rash. Neuro:  Strength and sensation are intact. Psych: Normal affect.  Accessory Clinical Findings    ECG -sinus bradycardia, 56, PAC, left axis deviation, left bundle branch block  Assessment & Plan    1.  Paroxysmal atrial fibrillation: Patient recently admitted in the setting of rapid atrial fibrillation.  During admission, she was placed on amiodarone underwent successful TEE and cardioversion.  Unfortunately, when seen at follow-up on January 29, she reported significant nausea and anorexia and  had already been seen in the emergency department once.  I reduced her amiodarone dose however, she continued to have nausea and anorexia on both 200 mg daily and 100 mg daily.  We discontinued amiodarone after speaking to her on the phone 1 week ago and anorexia and nausea have completely resolved.  She remains in sinus rhythm today and is in fact bradycardic with heart rate in  the mid 72s.  As amiodarone was only recently discontinued, changing antiarrhythmics at this time is not an option.  Continue beta-blocker and Eliquis therapy.  I will plan to have her follow-up in approximately 4 weeks at which point, we can consider checking an amiodarone level and discussing an alternate antiarrhythmic versus referral to A. fib clinic.  Much of this will hinge on whether or not she has any recurrence of A. fib going forward.   2.  Coronary artery disease: Status post recent catheterization revealing an atretic LIMA to the LAD with only minimal LAD disease and minor irregularities otherwise.  She has not had any recurrence of chest pain.  She remains on beta-blocker and statin therapy.  No aspirin in the setting of Eliquis.  3.  Hyperlipidemia: She remains on low-dose rosuvastatin which she is tolerating.  LDL in July 2018 was 80 with normal LFTs in October 2018.  4.  Premature ventricular contractions: Patient with prior history of frequent PVCs.  These have been quiescent since she was placed on amiodarone at the time of hospitalization.  She has been off of amiodarone for a week now and has not any recurrent palpitations.  Continue beta-blocker therapy.  She would like to hold off on referral to electrophysiology unless she has return of symptoms.  5.  Mixed ischemic and nonischemic cardiomyopathy: EF 35-40% by echo in December and TEE in January.  Cath without significant obstructive disease.  Euvolemic on exam.  Unclear role of PVCs at this time.  Continue beta-blocker.  Considering addition of ARB though she  currently has a cough and I would not want to cloud the picture any at this time.  We can consider ARB at follow-up visit.  6.  Cough: Long history of tobacco abuse with chronic bronchitic changes on chest x-ray in January.  She has had a nagging, nonproductive cough over the past 7-10 days.  She denies any fevers or chills.  She is not wheezing on exam today.  I will arrange for chest x-ray.  She will continue to take over-the-counter cough medicine which has been working.  7.  Disposition: Follow-up in clinic in approximately 1 month to reevaluate and consider alternate antiarrhythmic drug.   Murray Hodgkins, NP 02/03/2018, 4:36 PM

## 2018-02-03 NOTE — Patient Instructions (Signed)
Medication Instructions: - Your physician recommends that you continue on your current medications as directed. Please refer to the Current Medication list given to you today.  Labwork: - none ordered  Procedures/Testing: - A chest x-ray takes a picture of the organs and structures inside the chest, including the heart, lungs, and blood vessels. This test can show several things, including, whether the heart is enlarges; whether fluid is building up in the lungs; and whether pacemaker / defibrillator leads are still in place.- Please go to the [facility] entrance- 1st desk on the right to check in.   Follow-Up: - Your physician recommends that you schedule a follow-up appointment in: 1 month with Dr. Fletcher Anon (needs to be with Fletcher Anon).   Any Additional Special Instructions Will Be Listed Below (If Applicable).     If you need a refill on your cardiac medications before your next appointment, please call your pharmacy.

## 2018-02-12 ENCOUNTER — Ambulatory Visit: Payer: Medicare HMO | Admitting: Cardiovascular Disease

## 2018-02-13 ENCOUNTER — Other Ambulatory Visit: Payer: Self-pay | Admitting: Family Medicine

## 2018-02-13 NOTE — Telephone Encounter (Signed)
Patient is requesting a refill on the following medication  apixaban (ELIQUIS) 5 MG TABS tablet  She is completely out.  [facility] [place]

## 2018-02-15 ENCOUNTER — Other Ambulatory Visit: Payer: Self-pay

## 2018-02-15 ENCOUNTER — Telehealth: Payer: Self-pay | Admitting: Cardiovascular Disease

## 2018-02-15 MED ORDER — APIXABAN 5 MG PO TABS: 5.0000 mg | ORAL_TABLET | Freq: Two times a day (BID) | ORAL | 5 refills | Status: DC

## 2018-02-15 NOTE — Telephone Encounter (Signed)
*  STAT* If patient is at the pharmacy, call can be transferred to refill team.   1. Which medications need to be refilled? (please list name of each medication and dose if known) Eliquis 5 mg  2. Which pharmacy/location (including street and city if local pharmacy) is medication to be sent to? [facility] [place]  3. Do they need a 30 day or 90 day supply? 90 day Pt has been out since Friday

## 2018-02-15 NOTE — Telephone Encounter (Signed)
Pt calling stating her insurance company is working trying to lower the cost from $142  On her Eliquis   She is asking for samples until they figure a solution   Please advise

## 2018-02-15 NOTE — Telephone Encounter (Signed)
Please advise 

## 2018-02-15 NOTE — Telephone Encounter (Signed)
Please review.  Patient has been out of medication since Friday 02/12/2018 Patient stated her Eliquis is $142 and she is trying to find away to lower the cost.  Please advise if samples would be okay to give patient.  Patient is not sure how long she will be needing samples.

## 2018-02-15 NOTE — Telephone Encounter (Signed)
°*  STAT* If patient is at the pharmacy, call can be transferred to refill team.   1. Which medications need to be refilled? (please list name of each medication and dose if known) Eliquis 5 mg po BID   2. Which pharmacy/location (including street and city if local pharmacy) is medication to be sent to? [facility] [address]   3. Do they need a 30 day or 90 day supply? [place]

## 2018-02-15 NOTE — Telephone Encounter (Signed)
Please review for refill, Thanks !  

## 2018-02-15 NOTE — Telephone Encounter (Signed)
Pt states Eliquis cost $142/month. Daughter is working with insurance company to determine if there is a way to lower cost. She last took Eliquis on Friday; does not think she received a 30-day free card. Advised pt to come to the office before 5pm today for samples and a 30-day card. Explained the high cost may be related to her need to meet deductible. She will check with insurance company to determine deductible.  Samples of this drug were given to the patient, quantity 1 box, Lot Number  [id number]  Exp: 6/21

## 2018-02-16 ENCOUNTER — Ambulatory Visit: Payer: Medicare HMO | Admitting: Internal Medicine

## 2018-02-16 ENCOUNTER — Ambulatory Visit: Payer: PPO | Admitting: Cardiovascular Disease

## 2018-02-17 MED ORDER — APIXABAN 5 MG PO TABS: 5.0000 mg | ORAL_TABLET | Freq: Two times a day (BID) | ORAL | 5 refills | Status: DC

## 2018-02-17 NOTE — Telephone Encounter (Signed)
[facility] [place]

## 2018-02-24 ENCOUNTER — Ambulatory Visit: Payer: Self-pay | Admitting: Family Medicine

## 2018-02-25 ENCOUNTER — Ambulatory Visit: Payer: PPO

## 2018-02-25 ENCOUNTER — Ambulatory Visit (INDEPENDENT_AMBULATORY_CARE_PROVIDER_SITE_OTHER): Payer: Medicare HMO | Admitting: Family Medicine

## 2018-02-25 VITALS — BP 130/56 | HR 56 | Temp 98.0°F | Resp 16 | Wt 181.0 lb

## 2018-02-25 DIAGNOSIS — C50919 Malignant neoplasm of unspecified site of unspecified female breast: Secondary | ICD-10-CM

## 2018-02-25 DIAGNOSIS — F419 Anxiety disorder, unspecified: Secondary | ICD-10-CM | POA: Diagnosis not present

## 2018-02-25 DIAGNOSIS — R69 Illness, unspecified: Secondary | ICD-10-CM | POA: Diagnosis not present

## 2018-02-25 DIAGNOSIS — I25728 Atherosclerosis of autologous artery coronary artery bypass graft(s) with other forms of angina pectoris: Secondary | ICD-10-CM

## 2018-02-25 MED ORDER — LORAZEPAM 0.5 MG PO TABS: 0.5000 mg | ORAL_TABLET | Freq: Two times a day (BID) | ORAL | 1 refills | Status: DC | PRN

## 2018-02-25 NOTE — Progress Notes (Signed)
Teresa [name]  MRN: [id number] DOB: Aug 23, 1941  Subjective:  HPI   The patient is a 77 year old female who presents for follow up of her atrial fibrillation.  She was last seen on 01/27/18 for A Fib with unstable angina.   Since her appointment with [place] she has seen Dr Fletcher Anon twice.  He has has decreased and then stopped her Amioderone.  The patient reports feeling better now that she is off of it.   Patient Active Problem List   Diagnosis Date Noted  . Unstable angina ([place])   . Atrial fibrillation with rapid ventricular response ([place]) 01/13/2018  . Asthma 05/09/2017  . 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 Arthralgia of both hands 01/31/2016  . Ventricular premature depolarization 09/11/2015  . Psoriasis 07/16/2015  . 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 Hyperlipidemia 03/29/2015  . Pre-syncope 03/04/2015  . CAD (coronary artery disease)   . PVC's (premature ventricular contractions)   . Ischemic cardiomyopathy   . DDD (degenerative disc disease), lumbar 01/18/2015  . Intervertebral disc disorder with radiculopathy of lumbar region 12/26/2014  . L-S radiculopathy 07/03/2014  . Breast CA ([place]) 06/28/2014  . Arthritis, degenerative 06/28/2014  . Acid reflux 03/08/2014  . 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    Past Medical History:  Diagnosis Date  . Arthritis   . Asthma   . Back pain   . Breast cancer ([place]) 2009   left breast   . CAD (coronary artery disease)    a. 1994 s/p CABG x 1 (LIMA->LAD); b. 03/2015 MV: No ischemia; c. MV 11/18: small fixed apical defect likely secondary to breast attenuation, EF of 42%, frequent PVCs; d. 12/2017 Cath: LM nl, lAD 20p, D1/2/3 nl, LCX min irregs, OM1/2/3 min irregs, RCA nl, RPDA nl, RPL1/2 nl, LIMA->LAD atretic.  Marland Kitchen Dental crowns present    caps- left back top, right back bottom  . Gastroesophageal reflux disease   . Hypertension   . MI (myocardial infarction) ([place])  1994  . Mixed Ischemic & Nonischemic cardiomyopathy    a. 2013 EF 40%;  b. 03/2015 Echo: EF 55-60%, mild MR, mod dil LA, nl RV fxn; c. 12/18 Echo: EF of 35-40%, hypokinesis of the anteroseptal, and apical myocardium, mild to mod MR, mod dil LA, nl RV fxn, PASP 53 mmHg; d. 12/2017 TEE: EF 35-40%, diff HK, mild to mod MR. small PFO. No LAA/RAA thrombus.  Marland Kitchen PAF (paroxysmal atrial fibrillation) ([place])    a. diagnosed 01/13/2018; b. CHADS2VASc = 6 --> Eliquis; c. 12/2017 s/p TEE/DCCV. Amio started but d/c'd 01/2018 2/2 n/anorexia.  . Personal history of radiation therapy   . Psoriasis   . PSVT (paroxysmal supraventricular tachycardia) ([place])    a. 02/2015 Holter: short runs of SVT and NSVT.  Marland Kitchen Pulmonary hypertension ([place])   . PVC's (premature ventricular contractions)   . Rheumatoid arthritis ([facility])    feet, hands  . Vertigo    approx 2x/yr    Social History   Socioeconomic History  . Marital status: Divorced    Spouse name: Not on file  . Number of children: Not on file  . Years of education: Not on file  . Highest education level: Not on file  Social Needs  . Financial resource strain: Not on file  . Food insecurity - worry: Not on file  . Food insecurity - inability: Not on file  . Transportation needs - medical: Not on file  . Transportation  needs - non-medical: Not on file  Occupational History  . Not on file  Tobacco Use  . Smoking status: Former Smoker    Packs/day: 0.50    Years: 35.00    Pack years: 17.50    Types: Cigarettes  . Smokeless tobacco: Former Systems developer    Quit date: 12/22/1978  . Tobacco comment: Quit approx 1990  Substance and Sexual Activity  . Alcohol use: No  . Drug use: No  . Sexual activity: Not on file  Other Topics Concern  . Not on file  Social History Narrative  . Not on file    Outpatient Encounter Medications as of 02/25/2018  Medication Sig Note  . acetaminophen (TYLENOL) 325 MG tablet Take 650 mg by mouth every 6 (six) hours as needed for moderate  pain or headache.   Marland Kitchen apixaban (ELIQUIS) 5 MG TABS tablet Take 1 tablet (5 mg total) by mouth 2 (two) times daily.   Marland Kitchen Apremilast (OTEZLA) 30 MG TABS Take 30 mg by mouth 2 (two) times daily.   . budesonide-formoterol (SYMBICORT) 160-4.5 MCG/ACT inhaler Inhale 2 puffs into the lungs 2 (two) times daily. (Patient taking differently: Inhale 2 puffs into the lungs 2 (two) times daily as needed (for shortness of breath). ) 01/13/2018: Pt states she uses as needed  . diphenhydrAMINE (BENADRYL) 25 MG tablet Take 25 mg by mouth every 6 (six) hours as needed for allergies.    Marland Kitchen LORazepam (ATIVAN) 0.5 MG tablet Take 1 tablet (0.5 mg total) by mouth 2 (two) times daily as needed for anxiety.   . meclizine (ANTIVERT) 25 MG tablet Take 25 mg by mouth 3 (three) times daily as needed for dizziness.    . meloxicam (MOBIC) 7.5 MG tablet Take 1 tablet (7.5 mg total) by mouth daily. (Patient taking differently: Take 7.5 mg by mouth daily as needed for pain. )   . metoprolol succinate (TOPROL-XL) 50 MG 24 hr tablet Take 1 tablet (50 mg total) by mouth daily.   . montelukast (SINGULAIR) 10 MG tablet Take 10 mg by mouth daily as needed (for allergies).    . Multiple Vitamins-Minerals (MULTIVITAMIN PO) Take 1 tablet by mouth daily.   Vladimir Faster Glycol-Propyl Glycol (SYSTANE OP) Place 2 drops into both eyes daily.    . rosuvastatin (CRESTOR) 5 MG tablet Take 1 tablet (5 mg total) by mouth daily.    No facility-administered encounter medications on file as of 02/25/2018.     Allergies  Allergen Reactions  . Sulfa Antibiotics Hives  . Ivp Dye [Iodinated Diagnostic Agents] Swelling  . Amiodarone Nausea And Vomiting and Cough  . Codeine Other (See Comments)    ALTERED MENTAL STATUS    Review of Systems  Constitutional: Positive for malaise/fatigue (improving). Negative for fever.  Eyes: Negative.   Respiratory: Positive for cough (possibly some reflux association). Negative for shortness of breath and wheezing.     Cardiovascular: Positive for palpitations (rare beat on occasion). Negative for chest pain, orthopnea and leg swelling.  Gastrointestinal: Negative.   Genitourinary: Negative.   Skin: Negative.   Neurological: Negative for weakness.  Endo/Heme/Allergies: Negative.   Psychiatric/Behavioral: Negative.     Objective:  BP (!) 130/56 (BP Location: Right Arm, Patient Position: Sitting, Cuff Size: Normal)   Pulse (!) 56   Temp 98 F (36.7 C) (Oral)   Resp 16   Wt 181 lb (82.1 kg)   SpO2 96%   BMI 31.07 kg/m   Physical Exam  Constitutional: She is oriented to  person, place, and time and well-developed, well-nourished, and in no distress.  HENT:  Head: Normocephalic and atraumatic.  Right Ear: External ear normal.  Left Ear: External ear normal.  Nose: Nose normal.  Eyes: Conjunctivae are normal.  Cardiovascular: Normal rate, regular rhythm and normal heart sounds.  Pulmonary/Chest: Effort normal and breath sounds normal.  Abdominal: Soft.  Neurological: She is alert and oriented to person, place, and time. Gait normal. GCS score is 15.  Skin: Skin is warm and dry.  Psychiatric: Mood, memory, affect and judgment normal.    Assessment and Plan :  1. Anxiety  - LORazepam (ATIVAN) 0.5 MG tablet; Take 1 tablet (0.5 mg total) by mouth 2 (two) times daily as needed for anxiety.  Dispense: 60 tablet; Refill: 1 2.Afib Pt feels better off of amiodorone. 3.CAD  I have done the exam and reviewed the chart and it is accurate to the best of my knowledge. Development worker, community has been used and  any errors in dictation or transcription are unintentional. Miguel Aschoff M.D. [facility]

## 2018-03-04 ENCOUNTER — Ambulatory Visit: Payer: Medicare HMO | Admitting: Internal Medicine

## 2018-03-04 DIAGNOSIS — Z79899 Other long term (current) drug therapy: Secondary | ICD-10-CM | POA: Diagnosis not present

## 2018-03-04 DIAGNOSIS — M150 Primary generalized (osteo)arthritis: Secondary | ICD-10-CM | POA: Diagnosis not present

## 2018-03-04 DIAGNOSIS — Z96652 Presence of left artificial knee joint: Secondary | ICD-10-CM | POA: Diagnosis not present

## 2018-03-04 DIAGNOSIS — L4050 Arthropathic psoriasis, unspecified: Secondary | ICD-10-CM | POA: Diagnosis not present

## 2018-03-04 DIAGNOSIS — R768 Other specified abnormal immunological findings in serum: Secondary | ICD-10-CM | POA: Diagnosis not present

## 2018-03-04 DIAGNOSIS — L409 Psoriasis, unspecified: Secondary | ICD-10-CM | POA: Diagnosis not present

## 2018-03-05 ENCOUNTER — Telehealth: Payer: Self-pay | Admitting: Cardiovascular Disease

## 2018-03-05 MED ORDER — APIXABAN 5 MG PO TABS: 5.0000 mg | ORAL_TABLET | Freq: Two times a day (BID) | ORAL | 4 refills | Status: DC

## 2018-03-05 NOTE — Telephone Encounter (Signed)
Assistance paperwork received and will review with provider and then submit to company.

## 2018-03-05 NOTE — Telephone Encounter (Signed)
Patient dropped off [place] Patient Assistance forms to be completed and signed Placed in Nurse Box

## 2018-03-09 ENCOUNTER — Encounter: Payer: Self-pay | Admitting: Cardiovascular Disease

## 2018-03-09 ENCOUNTER — Ambulatory Visit: Payer: Medicare HMO | Admitting: Cardiovascular Disease

## 2018-03-09 VITALS — BP 130/60 | HR 67 | Ht 64.0 in | Wt 173.5 lb

## 2018-03-09 DIAGNOSIS — E785 Hyperlipidemia, unspecified: Secondary | ICD-10-CM | POA: Diagnosis not present

## 2018-03-09 DIAGNOSIS — I481 Persistent atrial fibrillation: Secondary | ICD-10-CM | POA: Diagnosis not present

## 2018-03-09 DIAGNOSIS — Z79899 Other long term (current) drug therapy: Secondary | ICD-10-CM

## 2018-03-09 DIAGNOSIS — I4819 Other persistent atrial fibrillation: Secondary | ICD-10-CM

## 2018-03-09 DIAGNOSIS — I2510 Atherosclerotic heart disease of native coronary artery without angina pectoris: Secondary | ICD-10-CM | POA: Diagnosis not present

## 2018-03-09 DIAGNOSIS — I493 Ventricular premature depolarization: Secondary | ICD-10-CM | POA: Diagnosis not present

## 2018-03-09 DIAGNOSIS — I5022 Chronic systolic (congestive) heart failure: Secondary | ICD-10-CM

## 2018-03-09 MED ORDER — LISINOPRIL 10 MG PO TABS: 10.0000 mg | ORAL_TABLET | Freq: Every day | ORAL | 3 refills | Status: DC

## 2018-03-09 NOTE — Progress Notes (Signed)
Cardiology Office Note   Date:  03/09/2018   ID:  JERINE SURLES, DOB [birth year], MRN [id number]  PCP:  Jerrol Banana., MD  Cardiologist:   Kathlyn Sacramento, MD   Chief Complaint  Patient presents with  . OTHER    1 month f/u no complaints today. Meds reviewed verbally with pt.      History of Present Illness: Teresa [name] is a 77 y.o. female who presents for a follow-up visit regarding coronary artery disease chronic systolic heart failure and persistent atrial fibrillation.  She is status post CABG in 1994 . She is known to have mild ischemic cardiomyopathy . She was diagnosed with PVCs in 2015 and was started on small dose Toprol. She does have underlying left bundle branch block. She has no history of diabetes, hypertension or hyperlipidemia. She is not a smoker. There is family history of coronary artery disease. She had worsening shortness of breath and chest pain in the fall 2018.  Echocardiogram showed a drop in EF to 35-40%.  She was then hospitalized for A. fib with RVR.  She underwent cardiac catheterization to evaluate her cardiomyopathy.  Cardiac cath showed atretic LIMA to LAD with otherwise mild nonobstructive disease.  She underwent successful TEE guided cardioversion.  She was on amiodarone which had to be stopped due to GI symptoms and anorexia.  She also had significant dry cough.  All the symptoms resolved after stopping amiodarone. She is feeling much better now with no chest pain, shortness of breath or palpitations.  Past Medical History:  Diagnosis Date  . Arthritis   . Asthma   . Back pain   . Breast cancer ([place]) 2009   left breast   . CAD (coronary artery disease)    a. 1994 s/p CABG x 1 (LIMA->LAD); b. 03/2015 MV: No ischemia; c. MV 11/18: small fixed apical defect likely secondary to breast attenuation, EF of 42%, frequent PVCs; d. 12/2017 Cath: LM nl, lAD 20p, D1/2/3 nl, LCX min irregs, OM1/2/3 min irregs, RCA nl, RPDA nl, RPL1/2 nl, LIMA->LAD  atretic.  Marland Kitchen Dental crowns present    caps- left back top, right back bottom  . Gastroesophageal reflux disease   . Hypertension   . MI (myocardial infarction) ([place]) 1994  . Mixed Ischemic & Nonischemic cardiomyopathy    a. 2013 EF 40%;  b. 03/2015 Echo: EF 55-60%, mild MR, mod dil LA, nl RV fxn; c. 12/18 Echo: EF of 35-40%, hypokinesis of the anteroseptal, and apical myocardium, mild to mod MR, mod dil LA, nl RV fxn, PASP 53 mmHg; d. 12/2017 TEE: EF 35-40%, diff HK, mild to mod MR. small PFO. No LAA/RAA thrombus.  Marland Kitchen PAF (paroxysmal atrial fibrillation) ([place])    a. diagnosed 01/13/2018; b. CHADS2VASc = 6 --> Eliquis; c. 12/2017 s/p TEE/DCCV. Amio started but d/c'd 01/2018 2/2 n/anorexia.  . Personal history of radiation therapy   . Psoriasis   . PSVT (paroxysmal supraventricular tachycardia) ([place])    a. 02/2015 Holter: short runs of SVT and NSVT.  Marland Kitchen Pulmonary hypertension ([place])   . PVC's (premature ventricular contractions)   . Rheumatoid arthritis ([facility])    feet, hands  . Vertigo    approx 2x/yr    Past Surgical History:  Procedure Laterality Date  . ABDOMINAL HYSTERECTOMY    . back fusion    . BREAST BIOPSY Right    1991 negative  . BREAST EXCISIONAL BIOPSY Left    2009 positive  . BREAST LUMPECTOMY    .  [place] N/A 01/15/2018   Procedure: CARDIOVERSION;  Surgeon: Wellington Hampshire, MD;  Location: [facility];  Service: Cardiovascular;  Laterality: N/A;  . CHOLECYSTECTOMY    . CORONARY ARTERY BYPASS GRAFT  1994   1 vessel - [facility]  . FOOT ARTHRODESIS Right 07/24/2016   Procedure: FUSION FIRST METATARSAL CUNEIFORM JOINT RIGHT FOOT, FUSION SECOND METATARSAL CUNEFORM JOINT BUNION REPAIR RIGHT FOOT;  Surgeon: Albertine Patricia, DPM;  Location: [place];  Service: Podiatry;  Laterality: Right;  . FOOT FRACTURE SURGERY    . HARDWARE REMOVAL Right 07/24/2016   Procedure: REMOVAL HARDWARE LATERAL MALLEOUS RIGHT  ANKLE;  Surgeon: Albertine Patricia, DPM;  Location: [place];  Service: Podiatry;  Laterality: Right;  REMOVAL OF PIN WHICH WAS INTACT  . HERNIA REPAIR    . RIGHT/LEFT HEART CATH AND CORONARY/GRAFT ANGIOGRAPHY N/A 01/14/2018   Procedure: LEFT HEART CATH AND CORONARY ANGIOGRAPHY;  Surgeon: Wellington Hampshire, MD;  Location: [place] [facility];  Service: Cardiovascular;  Laterality: N/A;  . TEE WITHOUT CARDIOVERSION N/A 01/15/2018   Procedure: TRANSESOPHAGEAL ECHOCARDIOGRAM (TEE);  Surgeon: Wellington Hampshire, MD;  Location: [facility];  Service: Cardiovascular;  Laterality: N/A;  . TOTAL HIP ARTHROPLASTY Left   . TOTAL KNEE ARTHROPLASTY Left      Current Outpatient Medications  Medication Sig Dispense Refill  . acetaminophen (TYLENOL) 325 MG tablet Take 650 mg by mouth every 6 (six) hours as needed for moderate pain or headache.    Marland Kitchen apixaban (ELIQUIS) 5 MG TABS tablet Take 1 tablet (5 mg total) by mouth 2 (two) times daily. 180 tablet 4  . Apremilast (OTEZLA) 30 MG TABS Take 30 mg by mouth 2 (two) times daily.    . budesonide-formoterol (SYMBICORT) 160-4.5 MCG/ACT inhaler Inhale 2 puffs into the lungs 2 (two) times daily. (Patient taking differently: Inhale 2 puffs into the lungs 2 (two) times daily as needed (for shortness of breath). ) 1 Inhaler 3  . diphenhydrAMINE (BENADRYL) 25 MG tablet Take 25 mg by mouth every 6 (six) hours as needed for allergies.     Marland Kitchen LORazepam (ATIVAN) 0.5 MG tablet Take 1 tablet (0.5 mg total) by mouth 2 (two) times daily as needed for anxiety. 60 tablet 1  . meclizine (ANTIVERT) 25 MG tablet Take 25 mg by mouth 3 (three) times daily as needed for dizziness.   1  . meloxicam (MOBIC) 7.5 MG tablet Take 1 tablet (7.5 mg total) by mouth daily. (Patient taking differently: Take 7.5 mg by mouth daily as needed for pain. ) 30 tablet 3  . metoprolol succinate (TOPROL-XL) 50 MG 24 hr tablet Take 1 tablet (50 mg total) by mouth daily. 30 tablet 0  . montelukast  (SINGULAIR) 10 MG tablet Take 10 mg by mouth daily as needed (for allergies).   12  . Multiple Vitamins-Minerals (MULTIVITAMIN PO) Take 1 tablet by mouth daily.    Vladimir Faster Glycol-Propyl Glycol (SYSTANE OP) Place 2 drops into both eyes daily.     . rosuvastatin (CRESTOR) 5 MG tablet Take 1 tablet (5 mg total) by mouth daily. 90 tablet 3   No current facility-administered medications for this visit.     Allergies:   Sulfa antibiotics; Ivp dye [iodinated diagnostic agents]; Amiodarone; and Codeine    Social History:  The patient  reports that she has quit smoking. Her smoking use included cigarettes. She has a 17.50 pack-year  smoking history. She quit smokeless tobacco use about 39 years ago. She reports that she does not drink alcohol or use drugs.   Family History:  The patient's family history includes Arthritis in her brother, sister, and sister; Bladder Cancer in her brother; Breast cancer in her sister and sister; Breast cancer (age of onset: 24) in her mother; Congestive Heart Failure in her mother; Heart attack in her father; Kidney failure in her sister; Lung cancer in her brother, sister, and sister; Prostate cancer in her brother.    ROS:  Please see the history of present illness.   Otherwise, review of systems are positive for none.   All other systems are reviewed and negative.    PHYSICAL EXAM: VS:  BP 130/60 (BP Location: Left Arm, Patient Position: Sitting, Cuff Size: Normal)   Pulse 67   Ht 5\' 4"  (1.626 m)   Wt 173 lb 8 oz (78.7 kg)   BMI 29.78 kg/m  , BMI Body mass index is 29.78 kg/m. GEN: Well nourished, well developed, in no acute distress  HEENT: normal  Neck: no JVD, carotid bruits, or masses Cardiac: RRR; no  rubs, or gallops,no edema . There is 1/ 6 systolic ejection murmur in the aortic area  Respiratory:  clear to auscultation bilaterally, normal work of breathing GI: soft, nontender, nondistended, + BS MS: no deformity or atrophy  Skin: warm and dry, no  rash Neuro:  Strength and sensation are intact Psych: euthymic mood, full affect   EKG:  EKG is ordered today. The ekg ordered today demonstrates normal sinus rhythm with left bundle branch block   Recent Labs: 10/21/2017: ALT 15 01/13/2018: Magnesium 1.9; TSH 1.468 01/14/2018: BUN 16; Creatinine, Ser 0.53; Potassium 4.6; Sodium 139 01/15/2018: Hemoglobin 14.5; Platelets 187    Lipid Panel    Component Value Date/Time   CHOL 157 06/23/2017 1446   CHOL 169 07/08/2016 1023   TRIG 77 06/23/2017 1446   HDL 62 06/23/2017 1446   HDL 63 07/08/2016 1023   CHOLHDL 2.5 06/23/2017 1446   VLDL 15 06/23/2017 1446   LDLCALC 80 06/23/2017 1446   LDLCALC 91 07/08/2016 1023      Wt Readings from Last 3 Encounters:  03/09/18 173 lb 8 oz (78.7 kg)  02/25/18 181 lb (82.1 kg)  02/03/18 175 lb 8 oz (79.6 kg)        ASSESSMENT AND PLAN:  1.  Persistent atrial fibrillation: She continues to maintain in sinus rhythm after successful cardioversion.  In spite of stopping amiodarone, she had no recurrent arrhythmia.  Recommend continuing anticoagulation with Eliquis and current dose of Toprol.  If she develops recurrent atrial fibrillation, she will need a different antiarrhythmic medication or consideration of ablation.   2. Coronary artery disease involving native coronary arteries : Cardiac catheterization showed mild nonobstructive disease.  Continue medical therapy.  3.  Frequent PVCs: PVCs were suppressed with amiodarone but unfortunately the medication had to be stopped due to side effects.  I am planning to repeat Holter monitor in 1 month.  If she continues to have hyperdynamic PVCs, I will refer her to Dr. Caryl Comes.  4.  Chronic systolic heart failure: Due to nonischemic cardiomyopathy.  Continue Toprol.  I elected to add lisinopril 10 mg daily.  Check basic metabolic profile in 1 month.  Check echo in 1 month.  If EF continues to be low 40%, I will consider switching to [facility].   5.  Hyperlipidemia: Previous myalgia with statins but she has been  tolerating small dose rosuvastatin.  Most recent LDL was 80.     Disposition:   FU with me in 2 months  Signed,  Kathlyn Sacramento, MD  03/09/2018 3:56 PM    [place]

## 2018-03-09 NOTE — Patient Instructions (Addendum)
Medication Instructions:  Your physician has recommended you make the following change in your medication:  START taking lisinopril 10mg  once daily    Labwork: BMET at the [facility] lab in 1 week. No appointment needed.   Testing/Procedures: Your physician has requested that you have an echocardiogram in one month. Echocardiography is a painless test that uses sound waves to create images of your heart. It provides your doctor with information about the size and shape of your heart and how well your heart's chambers and valves are working. This procedure takes approximately one hour. There are no restrictions for this procedure.  Your physician has recommended that you wear a 24 hour holter monitor in one month. Holter monitors are medical devices that record the heart's electrical activity. Doctors most often use these monitors to diagnose arrhythmias. Arrhythmias are problems with the speed or rhythm of the heartbeat. The monitor is a small, portable device. You can wear one while you do your normal daily activities. This is usually used to diagnose what is causing palpitations/syncope (passing out).    Follow-Up: Your physician recommends that you schedule a follow-up appointment in: 2 months with Dr. Fletcher Anon.    Any Other Special Instructions Will Be Listed Below (If Applicable).     If you need a refill on your cardiac medications before your next appointment, please call your pharmacy.  Echocardiogram An echocardiogram, or echocardiography, uses sound waves (ultrasound) to produce an image of your heart. The echocardiogram is simple, painless, obtained within a short period of time, and offers valuable information to your health care provider. The images from an echocardiogram can provide information such as:  Evidence of coronary artery disease (CAD).  Heart size.  Heart muscle function.  Heart valve function.  Aneurysm detection.  Evidence of a past heart  attack.  Fluid buildup around the heart.  Heart muscle thickening.  Assess heart valve function.  Tell a health care provider about:  Any allergies you have.  All medicines you are taking, including vitamins, herbs, eye drops, creams, and over-the-counter medicines.  Any problems you or family members have had with anesthetic medicines.  Any blood disorders you have.  Any surgeries you have had.  Any medical conditions you have.  Whether you are pregnant or may be pregnant. What happens before the procedure? No special preparation is needed. Eat and drink normally. What happens during the procedure?  In order to produce an image of your heart, gel will be applied to your chest and a wand-like tool (transducer) will be moved over your chest. The gel will help transmit the sound waves from the transducer. The sound waves will harmlessly bounce off your heart to allow the heart images to be captured in real-time motion. These images will then be recorded.  You may need an IV to receive a medicine that improves the quality of the pictures. What happens after the procedure? You may return to your normal schedule including diet, activities, and medicines, unless your health care provider tells you otherwise. This information is not intended to replace advice given to you by your health care provider. Make sure you discuss any questions you have with your health care provider. Document Released: 12/05/2000 Document Revised: 07/26/2016 Document Reviewed: 08/15/2013 Elsevier Interactive Patient Education  2017 [employer].  Holter Monitoring A Holter monitor is a small device that is used to detect abnormal heart rhythms. It clips to your clothing and is connected by wires to flat, sticky disks (electrodes) that attach  to your chest. It is worn continuously for 24-48 hours. Follow these instructions at home:  Wear your Holter monitor at all times, even while exercising and sleeping,  for as long as directed by your health care provider.  Make sure that the Holter monitor is safely clipped to your clothing or close to your body as recommended by your health care provider.  Do not get the monitor or wires wet.  Do not put body lotion or moisturizer on your chest.  Keep your skin clean.  Keep a diary of your daily activities, such as walking and doing chores. If you feel that your heartbeat is abnormal or that your heart is fluttering or skipping a beat: ? Record what you are doing when it happens. ? Record what time of day the symptoms occur.  Return your Holter monitor as directed by your health care provider.  Keep all follow-up visits as directed by your health care provider. This is important. Get help right away if:  You feel lightheaded or you faint.  You have trouble breathing.  You feel pain in your chest, upper arm, or jaw.  You feel sick to your stomach and your skin is pale, cool, or damp.  You heartbeat feels unusual or abnormal. This information is not intended to replace advice given to you by your health care provider. Make sure you discuss any questions you have with your health care provider. Document Released: 09/05/2004 Document Revised: 05/15/2016 Document Reviewed: 07/17/2014 Elsevier Interactive Patient Education  [employer].

## 2018-03-10 ENCOUNTER — Telehealth: Payer: Self-pay | Admitting: Cardiovascular Disease

## 2018-03-10 ENCOUNTER — Other Ambulatory Visit: Payer: Self-pay | Admitting: Family Medicine

## 2018-03-10 DIAGNOSIS — M1990 Unspecified osteoarthritis, unspecified site: Secondary | ICD-10-CM

## 2018-03-10 NOTE — Telephone Encounter (Signed)
Pt calling stating she was seen here yesterday  We prescribed a new BP medication  She is calling for she doesn't remember if she is to stop the current one she is taking or if she is to finish that and then start the new one   Please call back

## 2018-03-10 NOTE — Telephone Encounter (Signed)
I called and spoke with the patient- I advised her that she is to continue all of her current medications- she is to add lisinopril 10 mg once daily to her regimen.  The patient voices understanding was was very appreciative for the call back.

## 2018-03-10 NOTE — Telephone Encounter (Signed)
Forms faxed to assistance program and placed above desk.

## 2018-03-10 NOTE — Telephone Encounter (Signed)
[facility] faxed refill request for following medications:meloxicam (MOBIC) 7.5 MG tablet.They are requesting 90 day supply     Please advise,Thanks [place]

## 2018-03-11 DIAGNOSIS — M542 Cervicalgia: Secondary | ICD-10-CM | POA: Diagnosis not present

## 2018-03-11 DIAGNOSIS — M533 Sacrococcygeal disorders, not elsewhere classified: Secondary | ICD-10-CM | POA: Diagnosis not present

## 2018-03-12 ENCOUNTER — Other Ambulatory Visit: Payer: Self-pay | Admitting: Family Medicine

## 2018-03-12 DIAGNOSIS — M1990 Unspecified osteoarthritis, unspecified site: Secondary | ICD-10-CM

## 2018-03-12 NOTE — Telephone Encounter (Signed)
[facility] refill request for a 90-days supply for the following medication. Thanks CC  meloxicam (MOBIC) 7.5 MG tablet

## 2018-03-19 ENCOUNTER — Other Ambulatory Visit
Admission: RE | Admit: 2018-03-19 | Discharge: 2018-03-19 | Disposition: A | Discharge status: Home / Self Care | Payer: Medicare HMO | Source: Ambulatory Visit | Attending: Cardiovascular Disease | Admitting: Cardiovascular Disease

## 2018-03-19 DIAGNOSIS — M5136 Other intervertebral disc degeneration, lumbar region: Secondary | ICD-10-CM | POA: Diagnosis not present

## 2018-03-19 DIAGNOSIS — M5416 Radiculopathy, lumbar region: Secondary | ICD-10-CM | POA: Diagnosis not present

## 2018-03-19 DIAGNOSIS — Z79899 Other long term (current) drug therapy: Secondary | ICD-10-CM | POA: Diagnosis not present

## 2018-03-19 LAB — BASIC METABOLIC PANEL
Anion gap: 10 (ref 5–15)
BUN: 21 mg/dL — ABNORMAL HIGH (ref 6–20)
CO2: 24 mmol/L (ref 22–32)
Calcium: 9.6 mg/dL (ref 8.9–10.3)
Chloride: 106 mmol/L (ref 101–111)
Creatinine, Ser: 0.75 mg/dL (ref 0.44–1.00)
GFR calc Af Amer: >60 mL/min (ref >60)
GFR calc non Af Amer: >60 mL/min (ref >60)
Glucose, Bld: 93 mg/dL (ref 65–99)
Potassium: 4.3 mmol/L (ref 3.5–5.1)
Sodium: 140 mmol/L (ref 135–145)

## 2018-03-24 DIAGNOSIS — X32XXXA Exposure to sunlight, initial encounter: Secondary | ICD-10-CM | POA: Diagnosis not present

## 2018-03-24 DIAGNOSIS — D485 Neoplasm of uncertain behavior of skin: Secondary | ICD-10-CM | POA: Diagnosis not present

## 2018-03-24 DIAGNOSIS — Z85828 Personal history of other malignant neoplasm of skin: Secondary | ICD-10-CM | POA: Diagnosis not present

## 2018-03-24 DIAGNOSIS — D045 Carcinoma in situ of skin of trunk: Secondary | ICD-10-CM | POA: Diagnosis not present

## 2018-03-24 DIAGNOSIS — L565 Disseminated superficial actinic porokeratosis (DSAP): Secondary | ICD-10-CM | POA: Diagnosis not present

## 2018-03-24 DIAGNOSIS — Z08 Encounter for follow-up examination after completed treatment for malignant neoplasm: Secondary | ICD-10-CM | POA: Diagnosis not present

## 2018-03-24 DIAGNOSIS — D0461 Carcinoma in situ of skin of right upper limb, including shoulder: Secondary | ICD-10-CM | POA: Diagnosis not present

## 2018-03-24 DIAGNOSIS — L570 Actinic keratosis: Secondary | ICD-10-CM | POA: Diagnosis not present

## 2018-04-01 DIAGNOSIS — M5136 Other intervertebral disc degeneration, lumbar region: Secondary | ICD-10-CM | POA: Diagnosis not present

## 2018-04-01 DIAGNOSIS — M5416 Radiculopathy, lumbar region: Secondary | ICD-10-CM | POA: Diagnosis not present

## 2018-04-01 DIAGNOSIS — M48062 Spinal stenosis, lumbar region with neurogenic claudication: Secondary | ICD-10-CM | POA: Diagnosis not present

## 2018-04-03 ENCOUNTER — Other Ambulatory Visit: Payer: Self-pay | Admitting: Family Medicine

## 2018-04-03 DIAGNOSIS — M1990 Unspecified osteoarthritis, unspecified site: Secondary | ICD-10-CM

## 2018-04-06 ENCOUNTER — Ambulatory Visit (INDEPENDENT_AMBULATORY_CARE_PROVIDER_SITE_OTHER): Payer: Medicare HMO

## 2018-04-06 ENCOUNTER — Other Ambulatory Visit: Payer: Self-pay

## 2018-04-06 DIAGNOSIS — I493 Ventricular premature depolarization: Secondary | ICD-10-CM

## 2018-04-06 DIAGNOSIS — I5022 Chronic systolic (congestive) heart failure: Secondary | ICD-10-CM

## 2018-04-09 ENCOUNTER — Ambulatory Visit
Admission: RE | Admit: 2018-04-09 | Discharge: 2018-04-09 | Disposition: A | Discharge status: Home / Self Care | Payer: Medicare HMO | Source: Ambulatory Visit | Attending: Cardiovascular Disease | Admitting: Cardiovascular Disease

## 2018-04-09 DIAGNOSIS — I493 Ventricular premature depolarization: Secondary | ICD-10-CM | POA: Diagnosis not present

## 2018-04-15 ENCOUNTER — Other Ambulatory Visit: Payer: Self-pay

## 2018-04-15 MED ORDER — METOPROLOL SUCCINATE ER 25 MG PO TB24: 50.0000 mg | ORAL_TABLET | Freq: Every day | ORAL | 3 refills | Status: DC

## 2018-04-19 ENCOUNTER — Telehealth: Payer: Self-pay | Admitting: Family Medicine

## 2018-04-19 NOTE — Telephone Encounter (Signed)
Pt needs refill on her venlafaxine 75 mg to take 2 by mouth once daily.  I didn't see this on her medicaiton list  [facility] uses [facility]   Her call back is [phone]

## 2018-04-20 NOTE — Telephone Encounter (Signed)
She cannot take this with eliquis. No NSAIDs.

## 2018-04-21 ENCOUNTER — Other Ambulatory Visit: Payer: Self-pay

## 2018-04-21 MED ORDER — VENLAFAXINE HCL 75 MG PO TABS: 150.0000 mg | ORAL_TABLET | Freq: Every day | ORAL | 6 refills | Status: DC

## 2018-04-21 NOTE — Telephone Encounter (Signed)
Venlafaxine ok.

## 2018-04-21 NOTE — Telephone Encounter (Signed)
Could you please review this

## 2018-04-21 NOTE — Telephone Encounter (Signed)
done

## 2018-04-27 ENCOUNTER — Telehealth: Payer: Self-pay | Admitting: Cardiovascular Disease

## 2018-04-27 NOTE — Telephone Encounter (Signed)
BP Readings   4/25  134/60  57  4/26  133/58  52  4/27  126/50  55  4/28  150/60  52 Patient having pain   4/29  138/57  54   4/30  128/47  53  5/1  124/47  53

## 2018-04-27 NOTE — Telephone Encounter (Signed)
The patient was last seen by Dr. Fletcher Anon on 03/09/18. Lisinopril 10 mg once daily was added to her medication regimen. Will forward BP readings to Dr. Fletcher Anon to review.

## 2018-04-27 NOTE — Telephone Encounter (Signed)
Good blood pressure control overall.  Continue same medications.

## 2018-04-28 ENCOUNTER — Telehealth: Payer: Self-pay | Admitting: Emergency Medicine

## 2018-04-28 NOTE — Telephone Encounter (Signed)
Left message informing patient of options for travel.

## 2018-04-28 NOTE — Telephone Encounter (Signed)
Pt called asking about the measles vaccine. She is going to [place] in July and will be in close contact with people on planes etc and wants to know if she needs a measles vaccine because she does not remember every getting one. Please advise. Thanks.

## 2018-04-28 NOTE — Telephone Encounter (Signed)
She can choose to start series, but would recommend to call insurance to see if covered. Or she can have titers checked first to see if needed. Her choice.

## 2018-04-28 NOTE — Telephone Encounter (Signed)
I spoke with the patient. She is aware Dr. Fletcher Anon has reviewed her BP readings and was pleased with the results.  She is also aware that he recommends she continue her current medications.  The patient verbalizes understanding.

## 2018-05-03 ENCOUNTER — Ambulatory Visit (INDEPENDENT_AMBULATORY_CARE_PROVIDER_SITE_OTHER): Payer: Medicare HMO | Admitting: Family Medicine

## 2018-05-03 ENCOUNTER — Encounter: Payer: Self-pay | Admitting: Family Medicine

## 2018-05-03 VITALS — BP 124/60 | HR 56 | Temp 98.3°F | Wt 175.2 lb

## 2018-05-03 DIAGNOSIS — Z1159 Encounter for screening for other viral diseases: Secondary | ICD-10-CM

## 2018-05-03 DIAGNOSIS — L4050 Arthropathic psoriasis, unspecified: Secondary | ICD-10-CM

## 2018-05-03 NOTE — Progress Notes (Signed)
Patient: Teresa [name] Female    DOB: 02-25-1941   77 y.o.   MRN: [id number] Visit Date: 05/03/2018  Today's Provider: Vernie Murders, PA   Chief Complaint  Patient presents with  . MMR Titer   Subjective:    HPI Patient presents today to request a MMR titer. She states she will be going to [place], [place] on July 21st.     Past Medical History:  Diagnosis Date  . Arthritis   . Asthma   . Back pain   . Breast cancer ([place]) 2009   left breast   . CAD (coronary artery disease)    a. 1994 s/p CABG x 1 (LIMA->LAD); b. 03/2015 MV: No ischemia; c. MV 11/18: small fixed apical defect likely secondary to breast attenuation, EF of 42%, frequent PVCs; d. 12/2017 Cath: LM nl, lAD 20p, D1/2/3 nl, LCX min irregs, OM1/2/3 min irregs, RCA nl, RPDA nl, RPL1/2 nl, LIMA->LAD atretic.  Marland Kitchen Dental crowns present    caps- left back top, right back bottom  . Gastroesophageal reflux disease   . Hypertension   . MI (myocardial infarction) ([place]) 1994  . Mixed Ischemic & Nonischemic cardiomyopathy    a. 2013 EF 40%;  b. 03/2015 Echo: EF 55-60%, mild MR, mod dil LA, nl RV fxn; c. 12/18 Echo: EF of 35-40%, hypokinesis of the anteroseptal, and apical myocardium, mild to mod MR, mod dil LA, nl RV fxn, PASP 53 mmHg; d. 12/2017 TEE: EF 35-40%, diff HK, mild to mod MR. small PFO. No LAA/RAA thrombus.  Marland Kitchen PAF (paroxysmal atrial fibrillation) ([place])    a. diagnosed 01/13/2018; b. CHADS2VASc = 6 --> Eliquis; c. 12/2017 s/p TEE/DCCV. Amio started but d/c'd 01/2018 2/2 n/anorexia.  . Personal history of radiation therapy   . Psoriasis   . PSVT (paroxysmal supraventricular tachycardia) ([place])    a. 02/2015 Holter: short runs of SVT and NSVT.  Marland Kitchen Pulmonary hypertension ([place])   . PVC's (premature ventricular contractions)   . Rheumatoid arthritis ([facility])    feet, hands  . Vertigo    approx 2x/yr   Past Surgical History:  Procedure Laterality Date  . ABDOMINAL HYSTERECTOMY    . back fusion    . BREAST BIOPSY  Right    1991 negative  . BREAST EXCISIONAL BIOPSY Left    2009 positive  . BREAST LUMPECTOMY    . [place] N/A 01/15/2018   Procedure: CARDIOVERSION;  Surgeon: Wellington Hampshire, MD;  Location: [facility];  Service: Cardiovascular;  Laterality: N/A;  . CHOLECYSTECTOMY    . CORONARY ARTERY BYPASS GRAFT  1994   1 vessel - [facility]  . FOOT ARTHRODESIS Right 07/24/2016   Procedure: FUSION FIRST METATARSAL CUNEIFORM JOINT RIGHT FOOT, FUSION SECOND METATARSAL CUNEFORM JOINT BUNION REPAIR RIGHT FOOT;  Surgeon: Albertine Patricia, DPM;  Location: [facility];  Service: Podiatry;  Laterality: Right;  . FOOT FRACTURE SURGERY    . HARDWARE REMOVAL Right 07/24/2016   Procedure: REMOVAL HARDWARE LATERAL MALLEOUS RIGHT ANKLE;  Surgeon: Albertine Patricia, DPM;  Location: [place];  Service: Podiatry;  Laterality: Right;  REMOVAL OF PIN WHICH WAS INTACT  . HERNIA REPAIR    . RIGHT/LEFT HEART CATH AND CORONARY/GRAFT ANGIOGRAPHY N/A 01/14/2018   Procedure: LEFT HEART CATH AND CORONARY ANGIOGRAPHY;  Surgeon: Wellington Hampshire, MD;  Location: [place] [facility];  Service: Cardiovascular;  Laterality: N/A;  .  TEE WITHOUT CARDIOVERSION N/A 01/15/2018   Procedure: TRANSESOPHAGEAL ECHOCARDIOGRAM (TEE);  Surgeon: Wellington Hampshire, MD;  Location: [facility];  Service: Cardiovascular;  Laterality: N/A;  . TOTAL HIP ARTHROPLASTY Left   . TOTAL KNEE ARTHROPLASTY Left    Family History  Problem Relation Age of Onset  . Heart attack Father   . Congestive Heart Failure Mother   . Breast cancer Mother 49  . Kidney failure Sister   . Prostate cancer Brother   . Bladder Cancer Brother   . Lung cancer Sister   . Arthritis Sister   . Breast cancer Sister   . Arthritis Sister   . Breast cancer Sister   . Lung cancer Sister   . Lung cancer Brother   . Arthritis Brother     Allergies  Allergen Reactions  . Sulfa Antibiotics Hives  .  Ivp Dye [Iodinated Diagnostic Agents] Swelling  . Amiodarone Nausea And Vomiting and Cough  . Codeine Other (See Comments)    ALTERED MENTAL STATUS    Current Outpatient Medications:  .  acetaminophen (TYLENOL) 325 MG tablet, Take 650 mg by mouth every 6 (six) hours as needed for moderate pain or headache., Disp: , Rfl:  .  apixaban (ELIQUIS) 5 MG TABS tablet, Take 1 tablet (5 mg total) by mouth 2 (two) times daily., Disp: 180 tablet, Rfl: 4 .  Apremilast (OTEZLA) 30 MG TABS, Take 30 mg by mouth 2 (two) times daily., Disp: , Rfl:  .  budesonide-formoterol (SYMBICORT) 160-4.5 MCG/ACT inhaler, Inhale 2 puffs into the lungs 2 (two) times daily. (Patient taking differently: Inhale 2 puffs into the lungs 2 (two) times daily as needed (for shortness of breath). ), Disp: 1 Inhaler, Rfl: 3 .  diphenhydrAMINE (BENADRYL) 25 MG tablet, Take 25 mg by mouth every 6 (six) hours as needed for allergies. , Disp: , Rfl:  .  gabapentin (NEURONTIN) 100 MG capsule, Take by mouth., Disp: , Rfl:  .  lisinopril (PRINIVIL,ZESTRIL) 10 MG tablet, Take 1 tablet (10 mg total) by mouth daily., Disp: 90 tablet, Rfl: 3 .  LORazepam (ATIVAN) 0.5 MG tablet, Take 1 tablet (0.5 mg total) by mouth 2 (two) times daily as needed for anxiety., Disp: 60 tablet, Rfl: 1 .  meclizine (ANTIVERT) 25 MG tablet, Take 25 mg by mouth 3 (three) times daily as needed for dizziness. , Disp: , Rfl: 1 .  meloxicam (MOBIC) 7.5 MG tablet, Take 1 tablet (7.5 mg total) by mouth daily. (Patient taking differently: Take 7.5 mg by mouth daily as needed for pain. ), Disp: 30 tablet, Rfl: 3 .  metoprolol succinate (TOPROL-XL) 25 MG 24 hr tablet, Take 2 tablets (50 mg total) by mouth daily. Take with or immediately following a meal., Disp: 60 tablet, Rfl: 3 .  montelukast (SINGULAIR) 10 MG tablet, Take 10 mg by mouth daily as needed (for allergies). , Disp: , Rfl: 12 .  Multiple Vitamins-Minerals (MULTIVITAMIN PO), Take 1 tablet by mouth daily., Disp: , Rfl:    .  Polyethyl Glycol-Propyl Glycol (SYSTANE OP), Place 2 drops into both eyes daily. , Disp: , Rfl:  .  traMADol (ULTRAM) 50 MG tablet, Take by mouth., Disp: , Rfl:  .  venlafaxine (EFFEXOR) 75 MG tablet, Take 2 tablets (150 mg total) by mouth daily., Disp: 60 tablet, Rfl: 6 .  rosuvastatin (CRESTOR) 5 MG tablet, Take 1 tablet (5 mg total) by mouth daily., Disp: 90 tablet, Rfl: 3  Review of Systems  Constitutional: Negative.   Respiratory: Negative.  Cardiovascular: Negative.    Social History   Tobacco Use  . Smoking status: Former Smoker    Packs/day: 0.50    Years: 35.00    Pack years: 17.50    Types: Cigarettes  . Smokeless tobacco: Former Systems developer    Quit date: 12/22/1978  . Tobacco comment: Quit approx 1990  Substance Use Topics  . Alcohol use: No   Objective:   BP 124/60 (BP Location: Right Arm, Patient Position: Sitting, Cuff Size: Normal)   Pulse (!) 56   Temp 98.3 F (36.8 C) (Oral)   Wt 175 lb 3.2 oz (79.5 kg)   SpO2 97%   BMI 30.07 kg/m   Physical Exam  Constitutional: She is oriented to person, place, and time. She appears well-developed and well-nourished. No distress.  HENT:  Head: Normocephalic and atraumatic.  Right Ear: Hearing and external ear normal.  Left Ear: Hearing and external ear normal.  Nose: Nose normal.  Eyes: Conjunctivae and lids are normal. Right eye exhibits no discharge. Left eye exhibits no discharge. No scleral icterus.  Neck: No thyromegaly present.  Cardiovascular: Normal rate.  Pulmonary/Chest: Effort normal and breath sounds normal. No respiratory distress.  Abdominal: Soft. Bowel sounds are normal.  Musculoskeletal:  Stiffness in neck and hands with prominent joints in fingers - hx of psoriatic arthritis.  Neurological: She is alert and oriented to person, place, and time.  Skin: Skin is intact. No lesion and no rash noted.  Psychiatric: She has a normal mood and affect. Her speech is normal and behavior is normal. Thought  content normal.      Assessment & Plan:     1. Screening for measles Getting ready to travel to [place] to see family in July 2019. With so many reported cases of measles and her use of Otezla for psoriatic arthritis, she requests antibody test for MMR. Unsure if she had any of these immunizations or had the diseases themselves. - Measles/Mumps/Rubella Immunity  2. Psoriatic arthritis ([place]) Followed by Dr. Raj Janus (rheumatologist) and presently on the [place]. Always a little concerned about infections while taking this drug.        Vernie Murders, PA  [facility]

## 2018-05-04 ENCOUNTER — Telehealth: Payer: Self-pay

## 2018-05-04 LAB — MEASLES/MUMPS/RUBELLA IMMUNITY
MUMPS ABS, IGG: >300 AU/mL (ref >10.9)
RUBEOLA AB, IGG: >300 AU/mL (ref >29.9)
Rubella Antibodies, IGG: 9.42 index (ref >0.99)

## 2018-05-04 NOTE — Telephone Encounter (Signed)
-----  Message from Margo Common, [place] sent at 05/04/2018  7:59 AM EDT ----- Perfect immune status for MMR. Must have had these diseases as a child.

## 2018-05-04 NOTE — Telephone Encounter (Signed)
Advised patient of results.  

## 2018-05-04 NOTE — Telephone Encounter (Signed)
LMTCB

## 2018-05-07 ENCOUNTER — Ambulatory Visit: Payer: Medicare HMO | Admitting: Nurse Practitioner

## 2018-05-11 DIAGNOSIS — D485 Neoplasm of uncertain behavior of skin: Secondary | ICD-10-CM | POA: Diagnosis not present

## 2018-05-11 DIAGNOSIS — D045 Carcinoma in situ of skin of trunk: Secondary | ICD-10-CM | POA: Diagnosis not present

## 2018-05-11 DIAGNOSIS — C4442 Squamous cell carcinoma of skin of scalp and neck: Secondary | ICD-10-CM | POA: Diagnosis not present

## 2018-05-11 DIAGNOSIS — D0461 Carcinoma in situ of skin of right upper limb, including shoulder: Secondary | ICD-10-CM | POA: Diagnosis not present

## 2018-05-13 DIAGNOSIS — M5136 Other intervertebral disc degeneration, lumbar region: Secondary | ICD-10-CM | POA: Diagnosis not present

## 2018-05-13 DIAGNOSIS — M5416 Radiculopathy, lumbar region: Secondary | ICD-10-CM | POA: Diagnosis not present

## 2018-05-13 DIAGNOSIS — M48062 Spinal stenosis, lumbar region with neurogenic claudication: Secondary | ICD-10-CM | POA: Diagnosis not present

## 2018-05-16 ENCOUNTER — Other Ambulatory Visit: Payer: Self-pay | Admitting: Family Medicine

## 2018-05-21 ENCOUNTER — Ambulatory Visit: Payer: Medicare HMO | Admitting: Nurse Practitioner

## 2018-05-21 ENCOUNTER — Encounter: Payer: Self-pay | Admitting: Nurse Practitioner

## 2018-05-21 VITALS — BP 130/60 | HR 58 | Ht 64.0 in | Wt 175.0 lb

## 2018-05-21 DIAGNOSIS — I2510 Atherosclerotic heart disease of native coronary artery without angina pectoris: Secondary | ICD-10-CM | POA: Diagnosis not present

## 2018-05-21 DIAGNOSIS — I10 Essential (primary) hypertension: Secondary | ICD-10-CM | POA: Diagnosis not present

## 2018-05-21 DIAGNOSIS — I480 Paroxysmal atrial fibrillation: Secondary | ICD-10-CM | POA: Diagnosis not present

## 2018-05-21 DIAGNOSIS — E782 Mixed hyperlipidemia: Secondary | ICD-10-CM | POA: Diagnosis not present

## 2018-05-21 DIAGNOSIS — I493 Ventricular premature depolarization: Secondary | ICD-10-CM

## 2018-05-21 DIAGNOSIS — I428 Other cardiomyopathies: Secondary | ICD-10-CM

## 2018-05-21 MED ORDER — METOPROLOL SUCCINATE ER 50 MG PO TB24: 50.0000 mg | ORAL_TABLET | Freq: Every day | ORAL | 3 refills | Status: DC

## 2018-05-21 NOTE — Patient Instructions (Signed)
Medication Instructions:  Your physician has recommended you make the following change in your medication:  1. Sent in new script for Metoprolol Succinate 50 mg once daily  Follow-Up: Your physician wants you to follow-up in: 6 months with Dr. Fletcher Anon. You will receive a reminder letter in the mail two months in advance. If you don't receive a letter, please call our office to schedule the follow-up appointment.  It was a pleasure seeing you today here in the office. Please do not hesitate to give [place] a call back if you have any further questions. [place], BSN

## 2018-05-21 NOTE — Progress Notes (Signed)
Office Visit    Patient Name: Teresa [name] Date of Encounter: 05/21/2018  Primary Care Provider:  Jerrol Banana., MD Primary Cardiologist:  Kathlyn Sacramento, MD  Chief Complaint    77 year old female with a history of CAD status post CABG x1, hypertension, hyperlipidemia, frequent PVCs, paroxysmal atrial fibrillation, and chronic pain, who presents for follow-up.  Past Medical History    Past Medical History:  Diagnosis Date  . Arthritis   . Asthma   . Back pain   . Breast cancer ([place]) 2009   left breast   . CAD (coronary artery disease)    a. 1994 s/p CABG x 1 (LIMA->LAD); b. 03/2015 MV: No ischemia; c. MV 11/18: small fixed apical defect likely secondary to breast attenuation, EF of 42%, frequent PVCs; d. 12/2017 Cath: LM nl, LAD 20p, D1/2/3 nl, LCX min irregs, OM1/2/3 min irregs, RCA nl, RPDA nl, RPL1/2 nl, LIMA->LAD atretic.  Marland Kitchen Dental crowns present    caps- left back top, right back bottom  . Gastroesophageal reflux disease   . Hypertension   . MI (myocardial infarction) ([place]) 1994  . Mixed Ischemic & Nonischemic cardiomyopathy    a. 2013 EF 40%;  b. 03/2015 Echo: EF 55-60%, mild MR, mod dil LA, nl RV fxn; c. 12/18 Echo: EF of 35-40%, hypokinesis of the anteroseptal, and apical myocardium, mild to mod MR, mod dil LA, nl RV fxn, PASP 53 mmHg; d. 12/2017 TEE: EF 35-40%, diff HK, mild to mod MR. small PFO. No LAA/RAA thrombus.  Marland Kitchen PAF (paroxysmal atrial fibrillation) ([place])    a. diagnosed 01/13/2018; b. CHADS2VASc = 6 --> Eliquis; c. 12/2017 s/p TEE/DCCV. Amio started but d/c'd 01/2018 2/2 n/anorexia.  . Personal history of radiation therapy   . Psoriasis   . PSVT (paroxysmal supraventricular tachycardia) ([place])    a. 02/2015 Holter: short runs of SVT and NSVT.  Marland Kitchen Pulmonary hypertension ([place])   . PVC's (premature ventricular contractions)    a. 03/2018 24h Holter: Freq PVC's with a total of 421 beats in 24 hrs. 7 short runs of SVT likely representing Afib.  . Rheumatoid  arthritis (H[facility])    feet, hands  . Vertigo    approx 2x/yr   Past Surgical History:  Procedure Laterality Date  . ABDOMINAL HYSTERECTOMY    . back fusion    . BREAST BIOPSY Right    1991 negative  . BREAST EXCISIONAL BIOPSY Left    2009 positive  . BREAST LUMPECTOMY    . [place] N/A 01/15/2018   Procedure: CARDIOVERSION;  Surgeon: Wellington Hampshire, MD;  Location: [facility];  Service: Cardiovascular;  Laterality: N/A;  . CHOLECYSTECTOMY    . CORONARY ARTERY BYPASS GRAFT  1994   1 vessel - [facility]  . FOOT ARTHRODESIS Right 07/24/2016   Procedure: FUSION FIRST METATARSAL CUNEIFORM JOINT RIGHT FOOT, FUSION SECOND METATARSAL CUNEFORM JOINT BUNION REPAIR RIGHT FOOT;  Surgeon: Albertine Patricia, DPM;  Location: [place];  Service: Podiatry;  Laterality: Right;  . FOOT FRACTURE SURGERY    . HARDWARE REMOVAL Right 07/24/2016   Procedure: REMOVAL HARDWARE LATERAL MALLEOUS RIGHT ANKLE;  Surgeon: Albertine Patricia, DPM;  Location: [place];  Service: Podiatry;  Laterality: Right;  REMOVAL OF PIN WHICH WAS INTACT  . HERNIA REPAIR    . RIGHT/LEFT HEART CATH AND CORONARY/GRAFT ANGIOGRAPHY N/A 01/14/2018   Procedure: LEFT HEART CATH AND  CORONARY ANGIOGRAPHY;  Surgeon: Wellington Hampshire, MD;  Location: [place] [facility];  Service: Cardiovascular;  Laterality: N/A;  . TEE WITHOUT CARDIOVERSION N/A 01/15/2018   Procedure: TRANSESOPHAGEAL ECHOCARDIOGRAM (TEE);  Surgeon: Wellington Hampshire, MD;  Location: [facility];  Service: Cardiovascular;  Laterality: N/A;  . TOTAL HIP ARTHROPLASTY Left   . TOTAL KNEE ARTHROPLASTY Left     Allergies  Allergies  Allergen Reactions  . Sulfa Antibiotics Hives  . Ivp Dye [Iodinated Diagnostic Agents] Swelling  . Amiodarone Nausea And Vomiting and Cough  . Codeine Other (See Comments)    ALTERED MENTAL STATUS    History of Present Illness    77 year old female with the above  complex past medical history including CAD status post CABG x1 in 1984.  Other history includes mixed ischemic and nonischemic cardia myopathy with normalization of LV function by echo in April 2016, hypertension, hyperlipidemia, PSVT, frequent PVCs, breast cancer, and chronic pain.  In the late fall 2018, she experienced atypical chest discomfort and underwent stress testing which showed a fixed apical defect felt to be secondary to breast attenuation.  EF was 42%.  This was followed by an echocardiogram which confirmed LV dysfunction with EF of 35 to 40%.  With her prior history of cardiomyopathy and frequent PVCs, she was seen with a plan to set her up for catheterization and Holter monitoring however when she presented, she was in rapid atrial fibrillation.  She was subsequently admitted and placed on amiodarone.  She underwent catheterization which revealed an atretic LIMA to the LAD, and otherwise nonobstructive disease.  She then underwent TEE and cardioversion, which was successful.  Though she was discharged on amiodarone, this was subsequently discontinued secondary to nausea and anorexia.  She has since maintained sinus rhythm and wore a Holter monitor in April which showed frequent PVCs and 7 brief runs of atrial fibrillation.  Metoprolol was titrated to 50 mg daily.  Since her last visit, she says she has done very well.  She has not felt this well in some time.  She has not had any palpitations and denies chest pain, dyspnea, PND, orthopnea, dizziness, syncope, edema, or early satiety.  Home Medications    Prior to Admission medications   Medication Sig Start Date End Date Taking? Authorizing Provider  acetaminophen (TYLENOL) 325 MG tablet Take 650 mg by mouth every 6 (six) hours as needed for moderate pain or headache.   Yes [provider]  apixaban (ELIQUIS) 5 MG TABS tablet Take 1 tablet (5 mg total) by mouth 2 (two) times daily. 03/05/18  Yes Gollan, Kathlene November, MD  Apremilast  (OTEZLA) 30 MG TABS Take 30 mg by mouth 2 (two) times daily.   Yes [provider]  budesonide-formoterol (SYMBICORT) 160-4.5 MCG/ACT inhaler Inhale 2 puffs into the lungs 2 (two) times daily. Patient taking differently: Inhale 2 puffs into the lungs 2 (two) times daily as needed (for shortness of breath).  10/06/16  Yes Jerrol Banana., MD  diphenhydrAMINE (BENADRYL) 25 MG tablet Take 25 mg by mouth every 6 (six) hours as needed for allergies.    Yes [provider]  gabapentin (NEURONTIN) 100 MG capsule Take by mouth. 04/19/18 04/19/19 Yes [provider]  lisinopril (PRINIVIL,ZESTRIL) 10 MG tablet Take 1 tablet (10 mg total) by mouth daily. 03/09/18 06/07/18 Yes Wellington Hampshire, MD  LORazepam (ATIVAN) 0.5 MG tablet Take 1 tablet (0.5 mg total) by mouth 2 (two) times daily as needed for anxiety. 02/25/18  Yes Jerrol Banana., MD  meclizine (ANTIVERT) 25 MG tablet Take 25 mg by mouth 3 (three) times daily as needed for dizziness.  04/03/15  Yes [provider]  montelukast (SINGULAIR) 10 MG tablet Take 10 mg by mouth daily as needed (for allergies).  04/02/15  Yes [provider]  Multiple Vitamins-Minerals (MULTIVITAMIN PO) Take 1 tablet by mouth daily.   Yes [provider]  Polyethyl Glycol-Propyl Glycol (SYSTANE OP) Place 2 drops into both eyes daily.    Yes [provider]  rosuvastatin (CRESTOR) 5 MG tablet Take 1 tablet (5 mg total) by mouth daily. 08/25/17 05/21/18 Yes Wellington Hampshire, MD  traMADol (ULTRAM) 50 MG tablet Take by mouth. 04/05/18  Yes [provider]  venlafaxine (EFFEXOR) 75 MG tablet TAKE 2 TABLETS (150 MG TOTAL) BY MOUTH DAILY. 05/18/18  Yes Jerrol Banana., MD  metoprolol succinate (TOPROL-XL) 50 MG 24 hr tablet Take 1 tablet (50 mg total) by mouth daily. Take with or immediately following a meal. 05/21/18 08/19/18  Theora Gianotti, NP    Review of Systems    As above, she has  been feeling exceptionally well.  She denies chest pain, palpitations, dyspnea, pnd, orthopnea, n, v, dizziness, syncope, edema, weight gain, or early satiety.  All other systems reviewed and are otherwise negative except as noted above.  Physical Exam    VS:  BP 130/60 (BP Location: Left Arm, Patient Position: Sitting, Cuff Size: Normal)   Pulse (!) 58   Ht 5\' 4"  (1.626 m)   Wt 175 lb (79.4 kg)   BMI 30.04 kg/m  , BMI Body mass index is 30.04 kg/m. GEN: Well nourished, well developed, in no acute distress.  HEENT: normal.  Neck: Supple, no JVD, carotid bruits, or masses. Cardiac: RRR, no murmurs, rubs, or gallops. No clubbing, cyanosis, edema.  Radials/DP/PT 2+ and equal bilaterally.  Respiratory:  Respirations regular and unlabored, clear to auscultation bilaterally. GI: Soft, nontender, nondistended, BS + x 4. MS: no deformity or atrophy. Skin: warm and dry, no rash. Neuro:  Strength and sensation are intact. Psych: Normal affect.  Accessory Clinical Findings    ECG -sinus bradycardia with PVC, 58, left axis, left bundle branch block  Assessment & Plan    1.  Paroxysmal atrial fibrillation: Maintaining sinus rhythm on beta-blocker therapy.  Recent Holter monitoring did show brief runs of atrial fibrillation.  She has not had any symptoms/palpitations.  Tolerating Eliquis well.  Normal CBC in January.  Normal renal function in March.  2.  Mixed ischemic and nonischemic cardiomyopathy: EF 35 to 40% on last evaluation January 2019.  That was in the setting of atrial fibrillation.  Catheterization showed nonobstructive disease with an atretic LIMA to the LAD.  She has been stable from a heart failure standpoint and remains on beta-blocker and ACE inhibitor therapy.  She has not required diuretic.  We discussed the importance of daily weights, sodium restriction, medication compliance, and symptom reporting and she verbalizes understanding.   3.  Coronary artery disease: Nonobstructive  disease on catheterization earlier this year.  No chest pain.  Continue beta-blocker and statin therapy.  No aspirin in the setting of Eliquis.  4.  Hyperlipidemia: She remains on low-dose rosuvastatin which she is tolerating.  LDL in July 2018 was 80 with normal LFTs in October 2018.  5.  PVCs: Recent Holter showed frequent PVCs but less than 1024 hours.  These are mostly asymptomatic though she does note them when  is quiet at night.  She remains on beta-blocker therapy.  6.  Essential HTN: stable.  7.  Disposition: Follow-up in 6 months or sooner if necessary.  Murray Hodgkins, NP 05/21/2018, 3:34 PM

## 2018-06-11 ENCOUNTER — Other Ambulatory Visit: Payer: Self-pay | Admitting: Family Medicine

## 2018-06-11 DIAGNOSIS — M5412 Radiculopathy, cervical region: Secondary | ICD-10-CM | POA: Diagnosis not present

## 2018-06-11 DIAGNOSIS — M461 Sacroiliitis, not elsewhere classified: Secondary | ICD-10-CM | POA: Diagnosis not present

## 2018-06-11 DIAGNOSIS — M5136 Other intervertebral disc degeneration, lumbar region: Secondary | ICD-10-CM | POA: Diagnosis not present

## 2018-06-11 DIAGNOSIS — M5416 Radiculopathy, lumbar region: Secondary | ICD-10-CM | POA: Diagnosis not present

## 2018-06-11 DIAGNOSIS — M5030 Other cervical disc degeneration, unspecified cervical region: Secondary | ICD-10-CM | POA: Diagnosis not present

## 2018-06-11 DIAGNOSIS — Z1231 Encounter for screening mammogram for malignant neoplasm of breast: Secondary | ICD-10-CM

## 2018-06-11 DIAGNOSIS — M48062 Spinal stenosis, lumbar region with neurogenic claudication: Secondary | ICD-10-CM | POA: Diagnosis not present

## 2018-06-17 ENCOUNTER — Ambulatory Visit
Admission: RE | Admit: 2018-06-17 | Discharge: 2018-06-17 | Disposition: A | Discharge status: Home / Self Care | Payer: Medicare HMO | Source: Ambulatory Visit | Attending: Family Medicine | Admitting: Family Medicine

## 2018-06-17 DIAGNOSIS — Z1231 Encounter for screening mammogram for malignant neoplasm of breast: Secondary | ICD-10-CM | POA: Diagnosis not present

## 2018-06-21 DIAGNOSIS — H26493 Other secondary cataract, bilateral: Secondary | ICD-10-CM | POA: Diagnosis not present

## 2018-06-21 DIAGNOSIS — Z961 Presence of intraocular lens: Secondary | ICD-10-CM | POA: Diagnosis not present

## 2018-07-01 DIAGNOSIS — M5416 Radiculopathy, lumbar region: Secondary | ICD-10-CM | POA: Diagnosis not present

## 2018-07-01 DIAGNOSIS — M5136 Other intervertebral disc degeneration, lumbar region: Secondary | ICD-10-CM | POA: Diagnosis not present

## 2018-07-02 DIAGNOSIS — M4802 Spinal stenosis, cervical region: Secondary | ICD-10-CM | POA: Diagnosis not present

## 2018-07-02 DIAGNOSIS — M5412 Radiculopathy, cervical region: Secondary | ICD-10-CM | POA: Diagnosis not present

## 2018-07-02 DIAGNOSIS — M5002 Cervical disc disorder with myelopathy, mid-cervical region, unspecified level: Secondary | ICD-10-CM | POA: Diagnosis not present

## 2018-07-02 DIAGNOSIS — M542 Cervicalgia: Secondary | ICD-10-CM | POA: Diagnosis not present

## 2018-07-02 DIAGNOSIS — M069 Rheumatoid arthritis, unspecified: Secondary | ICD-10-CM | POA: Diagnosis not present

## 2018-07-08 ENCOUNTER — Other Ambulatory Visit: Payer: Self-pay | Admitting: Family Medicine

## 2018-07-08 NOTE — Telephone Encounter (Signed)
Pt needs refill on her  Venlafaxine 75 mg  [facility]

## 2018-07-08 NOTE — Telephone Encounter (Signed)
Please review. Thanks!  

## 2018-07-09 DIAGNOSIS — M4722 Other spondylosis with radiculopathy, cervical region: Secondary | ICD-10-CM | POA: Diagnosis not present

## 2018-07-09 DIAGNOSIS — M5412 Radiculopathy, cervical region: Secondary | ICD-10-CM | POA: Diagnosis not present

## 2018-07-09 MED ORDER — VENLAFAXINE HCL 75 MG PO TABS: 150.0000 mg | ORAL_TABLET | Freq: Every day | ORAL | 3 refills | Status: DC

## 2018-08-09 DIAGNOSIS — C4442 Squamous cell carcinoma of skin of scalp and neck: Secondary | ICD-10-CM | POA: Diagnosis not present

## 2018-08-10 DIAGNOSIS — M180 Bilateral primary osteoarthritis of first carpometacarpal joints: Secondary | ICD-10-CM | POA: Diagnosis not present

## 2018-08-10 DIAGNOSIS — M5136 Other intervertebral disc degeneration, lumbar region: Secondary | ICD-10-CM | POA: Diagnosis not present

## 2018-08-10 DIAGNOSIS — L4050 Arthropathic psoriasis, unspecified: Secondary | ICD-10-CM | POA: Diagnosis not present

## 2018-08-10 DIAGNOSIS — M150 Primary generalized (osteo)arthritis: Secondary | ICD-10-CM | POA: Diagnosis not present

## 2018-08-10 DIAGNOSIS — L409 Psoriasis, unspecified: Secondary | ICD-10-CM | POA: Diagnosis not present

## 2018-08-16 DIAGNOSIS — M542 Cervicalgia: Secondary | ICD-10-CM | POA: Diagnosis not present

## 2018-08-17 ENCOUNTER — Telehealth: Payer: Self-pay | Admitting: Cardiovascular Disease

## 2018-08-17 NOTE — Telephone Encounter (Signed)
Pt needs clearance to have neck surgery by Dr. Leona Carry in [place]. I informed pt we would need to receive a request from her surgeon. She states she will contact them .

## 2018-08-17 NOTE — Telephone Encounter (Signed)
Noted thanks °

## 2018-08-19 ENCOUNTER — Telehealth: Payer: Self-pay | Admitting: Cardiovascular Disease

## 2018-08-19 NOTE — Telephone Encounter (Signed)
° °  [facility] Pre-operative Risk Assessment    Request for surgical clearance:  1. What type of surgery is being performed? C3-c6 PSIE    2. When is this surgery scheduled? Not listed    3. What type of clearance is required (medical clearance vs. Pharmacy clearance to hold med vs. Both)?  Pharmacy  4. Are there any medications that need to be held prior to surgery and how long?Eliquis    5. Practice name and name of physician performing surgery? [facility]   6. What is your office phone number [phone]   7.   What is your office fax number [phone]  8.   Anesthesia type (None, local, MAC, general) ?

## 2018-08-19 NOTE — Telephone Encounter (Signed)
Routing to Dr Arida.  

## 2018-08-20 NOTE — Telephone Encounter (Signed)
She is at moderate risk from a cardiac standpoint with no need for stress testing. Eliquis can be held 3 days before surgery.

## 2018-08-20 NOTE — Telephone Encounter (Signed)
Routed to number provided via EPIC fax.  

## 2018-08-24 NOTE — Telephone Encounter (Signed)
Pt would like to discuss her medical clearance. Please call.

## 2018-08-24 NOTE — Telephone Encounter (Signed)
Patient has been made aware to hold the Eliquis 3 days prior to the surgery. She will call back if anything further is needed.

## 2018-08-30 NOTE — Telephone Encounter (Signed)
No answer. Left message to call back.   

## 2018-08-30 NOTE — Telephone Encounter (Signed)
Pt would like to know when she can restart her Eliquis. Please call and advise.

## 2018-08-30 NOTE — Telephone Encounter (Signed)
Patient calling back. She knows when to stop taking Eliquis before surgery but not sure about after.  Advised patient to ask her surgeon when he would like her to restart it after the procedure. She verbalized understanding and was appreciative.

## 2018-08-30 NOTE — Telephone Encounter (Signed)
Patient calling to check on status, would like to know when to start back on Eliquis Please call

## 2018-08-31 ENCOUNTER — Ambulatory Visit (INDEPENDENT_AMBULATORY_CARE_PROVIDER_SITE_OTHER): Payer: Medicare HMO | Admitting: Family Medicine

## 2018-08-31 VITALS — BP 150/70 | HR 64 | Resp 16 | Wt 173.0 lb

## 2018-08-31 DIAGNOSIS — Z23 Encounter for immunization: Secondary | ICD-10-CM | POA: Diagnosis not present

## 2018-08-31 DIAGNOSIS — Z951 Presence of aortocoronary bypass graft: Secondary | ICD-10-CM | POA: Diagnosis not present

## 2018-08-31 DIAGNOSIS — C50919 Malignant neoplasm of unspecified site of unspecified female breast: Secondary | ICD-10-CM | POA: Diagnosis not present

## 2018-08-31 DIAGNOSIS — M5430 Sciatica, unspecified side: Secondary | ICD-10-CM | POA: Diagnosis not present

## 2018-08-31 DIAGNOSIS — Z01818 Encounter for other preprocedural examination: Secondary | ICD-10-CM

## 2018-08-31 DIAGNOSIS — I25728 Atherosclerosis of autologous artery coronary artery bypass graft(s) with other forms of angina pectoris: Secondary | ICD-10-CM | POA: Diagnosis not present

## 2018-08-31 DIAGNOSIS — I4891 Unspecified atrial fibrillation: Secondary | ICD-10-CM | POA: Diagnosis not present

## 2018-08-31 MED ORDER — PREDNISONE 10 MG PO TABS: 10.0000 mg | ORAL_TABLET | Freq: Every day | ORAL | 0 refills | Status: DC

## 2018-08-31 NOTE — Progress Notes (Signed)
Teresa [name]  MRN: [id number] DOB: [birth year]  Subjective:  HPI   Patient is a 77 year old female who presents for pre op surgical clearance.   The patient has already received clearance from her cardiologist.  However, she did not see him and her last EKG was in May 2019.  The patient is scheduled to have cervical fusion on 09/16/18 by Dr. Dava Najjar at [facility].  Patient denies any history of adverse effects of anesthesia.  She denies any bleeding or clotting disorders.  However she is on Eliquis and she is aware of when to stop this prior to surgery.    No family history of adverse anesthesia issues, bleeding or clotting disorder.    Patient plans to go to rehab after surgery, she does have a good support team for post op care once she does go home.      Patient Active Problem List   Diagnosis Date Noted  . Psoriatic arthritis ([place]) 03/04/2018  . Unstable angina ([place])   . Atrial fibrillation with rapid ventricular response ([place]) 01/13/2018  . 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 Asthma 05/09/2017  . 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 Arthralgia of both hands 01/31/2016  . Ventricular premature depolarization 09/11/2015  . Psoriasis 07/16/2015  . 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 Hyperlipidemia 03/29/2015  . Pre-syncope 03/04/2015  . CAD (coronary artery disease)   . PVC's (premature ventricular contractions)   . Ischemic cardiomyopathy   . DDD (degenerative disc disease), lumbar 01/18/2015  . Intervertebral disc disorder with radiculopathy of lumbar region 12/26/2014  . L-S radiculopathy 07/03/2014  . Breast CA ([place]) 06/28/2014  . Arthritis, degenerative 06/28/2014  . Acid reflux 03/08/2014  . 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    Past Medical History:  Diagnosis Date  . Arthritis   . Asthma   . Back pain   . Breast cancer ([place]) 2009   left breast   . CAD (coronary artery  disease)    a. 1994 s/p CABG x 1 (LIMA->LAD); b. 03/2015 MV: No ischemia; c. MV 11/18: small fixed apical defect likely secondary to breast attenuation, EF of 42%, frequent PVCs; d. 12/2017 Cath: LM nl, LAD 20p, D1/2/3 nl, LCX min irregs, OM1/2/3 min irregs, RCA nl, RPDA nl, RPL1/2 nl, LIMA->LAD atretic.  Marland Kitchen Dental crowns present    caps- left back top, right back bottom  . Gastroesophageal reflux disease   . Hypertension   . MI (myocardial infarction) ([place]) 1994  . Mixed Ischemic & Nonischemic cardiomyopathy    a. 2013 EF 40%;  b. 03/2015 Echo: EF 55-60%, mild MR, mod dil LA, nl RV fxn; c. 12/18 Echo: EF of 35-40%, hypokinesis of the anteroseptal, and apical myocardium, mild to mod MR, mod dil LA, nl RV fxn, PASP 53 mmHg; d. 12/2017 TEE: EF 35-40%, diff HK, mild to mod MR. small PFO. No LAA/RAA thrombus.  Marland Kitchen PAF (paroxysmal atrial fibrillation) ([place])    a. diagnosed 01/13/2018; b. CHADS2VASc = 6 --> Eliquis; c. 12/2017 s/p TEE/DCCV. Amio started but d/c'd 01/2018 2/2 n/anorexia.  . Personal history of radiation therapy   . Psoriasis   . PSVT (paroxysmal supraventricular tachycardia) ([place])    a. 02/2015 Holter: short runs of SVT and NSVT.  Marland Kitchen Pulmonary hypertension ([place])   . PVC's (premature ventricular contractions)    a. 03/2018 24h Holter: Freq PVC's with a total of 421 beats in 24 hrs. 7 short runs of SVT  likely representing Afib.  . Rheumatoid arthritis (HCC)    feet, hands  . Vertigo    approx 2x/yr    Social History   Socioeconomic History  . Marital status: Divorced    Spouse name: Not on file  . Number of children: Not on file  . Years of education: Not on file  . Highest education level: Not on file  Occupational History  . Not on file  Social Needs  . Financial resource strain: Not on file  . Food insecurity:    Worry: Not on file    Inability: Not on file  . Transportation needs:    Medical: Not on file    Non-medical: Not on file  Tobacco Use  . Smoking status: Former  Smoker    Packs/day: 0.50    Years: 35.00    Pack years: 17.50    Types: Cigarettes  . Smokeless tobacco: Former Systems developer    Quit date: 12/22/1978  . Tobacco comment: Quit approx 1990  Substance and Sexual Activity  . Alcohol use: No  . Drug use: No  . Sexual activity: Not on file  Lifestyle  . Physical activity:    Days per week: Not on file    Minutes per session: Not on file  . Stress: Not on file  Relationships  . Social connections:    Talks on phone: Not on file    Gets together: Not on file    Attends religious service: Not on file    Active member of club or organization: Not on file    Attends meetings of clubs or organizations: Not on file    Relationship status: Not on file  . Intimate partner violence:    Fear of current or ex partner: Not on file    Emotionally abused: Not on file    Physically abused: Not on file    Forced sexual activity: Not on file  Other Topics Concern  . Not on file  Social History Narrative  . Not on file    Outpatient Encounter Medications as of 08/31/2018  Medication Sig Note  . acetaminophen (TYLENOL) 325 MG tablet Take 650 mg by mouth every 6 (six) hours as needed for moderate pain or headache.   Marland Kitchen apixaban (ELIQUIS) 5 MG TABS tablet Take 1 tablet (5 mg total) by mouth 2 (two) times daily.   Marland Kitchen Apremilast (OTEZLA) 30 MG TABS Take 30 mg by mouth 2 (two) times daily.   . budesonide-formoterol (SYMBICORT) 160-4.5 MCG/ACT inhaler Inhale 2 puffs into the lungs 2 (two) times daily. (Patient taking differently: Inhale 2 puffs into the lungs 2 (two) times daily as needed (for shortness of breath). ) 01/13/2018: Pt states she uses as needed  . diphenhydrAMINE (BENADRYL) 25 MG tablet Take 25 mg by mouth every 6 (six) hours as needed for allergies.    Marland Kitchen gabapentin (NEURONTIN) 100 MG capsule Take by mouth.   Marland Kitchen LORazepam (ATIVAN) 0.5 MG tablet Take 1 tablet (0.5 mg total) by mouth 2 (two) times daily as needed for anxiety.   . meclizine (ANTIVERT) 25 MG  tablet Take 25 mg by mouth 3 (three) times daily as needed for dizziness.    . montelukast (SINGULAIR) 10 MG tablet Take 10 mg by mouth daily as needed (for allergies).    . Multiple Vitamins-Minerals (MULTIVITAMIN PO) Take 1 tablet by mouth daily.   Vladimir Faster Glycol-Propyl Glycol (SYSTANE OP) Place 2 drops into both eyes daily.    . traMADol (ULTRAM) 50  MG tablet Take by mouth.   . venlafaxine (EFFEXOR) 75 MG tablet Take 2 tablets (150 mg total) by mouth daily.   Marland Kitchen lisinopril (PRINIVIL,ZESTRIL) 10 MG tablet Take 1 tablet (10 mg total) by mouth daily.   . metoprolol succinate (TOPROL-XL) 50 MG 24 hr tablet Take 1 tablet (50 mg total) by mouth daily. Take with or immediately following a meal.   . rosuvastatin (CRESTOR) 5 MG tablet Take 1 tablet (5 mg total) by mouth daily.    No facility-administered encounter medications on file as of 08/31/2018.     Allergies  Allergen Reactions  . Sulfa Antibiotics Hives  . Ivp Dye [Iodinated Diagnostic Agents] Swelling  . Amiodarone Nausea And Vomiting and Cough  . Codeine Other (See Comments)    ALTERED MENTAL STATUS    Review of Systems  Constitutional: Negative for chills, diaphoresis, fever and malaise/fatigue.  Respiratory: Negative for cough, shortness of breath and wheezing.   Cardiovascular: Negative for chest pain, palpitations, orthopnea, claudication and leg swelling.  Gastrointestinal: Negative.   Musculoskeletal:       Recent flare of right leg sciatica.  Skin: Positive for itching and rash (psoriasis).  Endo/Heme/Allergies: Negative.   Psychiatric/Behavioral: Negative.     Objective:  BP (!) 150/70 (BP Location: Right Arm, Patient Position: Sitting, Cuff Size: Normal)   Pulse 64   Resp 16   Wt 173 lb (78.5 kg)   BMI 29.70 kg/m   Physical Exam  Constitutional: She is oriented to person, place, and time and well-developed, well-nourished, and in no distress.  HENT:  Head: Normocephalic and atraumatic.  Right Ear: External  ear normal.  Left Ear: External ear normal.  Nose: Nose normal.  Eyes: Conjunctivae are normal. No scleral icterus.  Neck: No thyromegaly present.  Cardiovascular: Normal rate, regular rhythm, normal heart sounds and intact distal pulses.  Pulmonary/Chest: Effort normal and breath sounds normal.  Abdominal: Soft.  Musculoskeletal: She exhibits no edema.  Neurological: She is alert and oriented to person, place, and time. Gait normal. GCS score is 15.  Skin: Skin is warm and dry.  Fair skin  Psychiatric: Mood, memory, affect and judgment normal.    Assessment and Plan :  1. Pre-op exam Medically cleared. - EKG 12-Lead  2. Need for influenza vaccination  - Flu vaccine HIGH DOSE PF (Fluzone High dose)  3. Sciatica, unspecified laterality  - predniSONE (DELTASONE) 10 MG tablet; Take 1 tablet (10 mg total) by mouth daily with breakfast.  Dispense: 21 tablet; Refill: 0 4.Afib 5.h/o CABG

## 2018-09-03 DIAGNOSIS — I255 Ischemic cardiomyopathy: Secondary | ICD-10-CM | POA: Diagnosis not present

## 2018-09-03 DIAGNOSIS — Z01818 Encounter for other preprocedural examination: Secondary | ICD-10-CM | POA: Diagnosis not present

## 2018-09-03 DIAGNOSIS — M5417 Radiculopathy, lumbosacral region: Secondary | ICD-10-CM | POA: Diagnosis not present

## 2018-09-03 DIAGNOSIS — Z951 Presence of aortocoronary bypass graft: Secondary | ICD-10-CM | POA: Diagnosis not present

## 2018-09-03 DIAGNOSIS — C50919 Malignant neoplasm of unspecified site of unspecified female breast: Secondary | ICD-10-CM | POA: Diagnosis not present

## 2018-09-03 DIAGNOSIS — M5136 Other intervertebral disc degeneration, lumbar region: Secondary | ICD-10-CM | POA: Diagnosis not present

## 2018-09-03 DIAGNOSIS — I2510 Atherosclerotic heart disease of native coronary artery without angina pectoris: Secondary | ICD-10-CM | POA: Diagnosis not present

## 2018-09-03 DIAGNOSIS — K219 Gastro-esophageal reflux disease without esophagitis: Secondary | ICD-10-CM | POA: Diagnosis not present

## 2018-09-03 DIAGNOSIS — E669 Obesity, unspecified: Secondary | ICD-10-CM | POA: Diagnosis not present

## 2018-09-03 DIAGNOSIS — M150 Primary generalized (osteo)arthritis: Secondary | ICD-10-CM | POA: Diagnosis not present

## 2018-09-13 ENCOUNTER — Telehealth: Payer: Self-pay | Admitting: Family Medicine

## 2018-09-13 MED ORDER — AZITHROMYCIN 250 MG PO TABS: ORAL_TABLET | ORAL | 0 refills | Status: DC

## 2018-09-13 NOTE — Telephone Encounter (Signed)
ZpaK

## 2018-09-13 NOTE — Telephone Encounter (Signed)
Coughing up congestion since Fri.  Pt is schedule to have surgery on Thurs.  Pt was told to call her PCP since she is sick.  Please advise. Thanks, [employer]

## 2018-09-13 NOTE — Telephone Encounter (Signed)
Do we need to see her or can you call something in for her

## 2018-09-13 NOTE — Telephone Encounter (Signed)
Pt called for a status update on being worked into to Dr. Alben Spittle schedule for today. Pt stated that she has a lot of sinus pressure, pain in ears, eyes, sore throat, cough, & congestion. Pharmacy: [facility]. Please advise. Thanks TNP

## 2018-09-20 ENCOUNTER — Ambulatory Visit: Payer: Medicare HMO | Admitting: Family Medicine

## 2018-09-23 ENCOUNTER — Other Ambulatory Visit: Payer: Self-pay | Admitting: Cardiovascular Disease

## 2018-09-23 DIAGNOSIS — G959 Disease of spinal cord, unspecified: Secondary | ICD-10-CM | POA: Diagnosis not present

## 2018-09-23 DIAGNOSIS — Z951 Presence of aortocoronary bypass graft: Secondary | ICD-10-CM | POA: Diagnosis not present

## 2018-09-23 DIAGNOSIS — I252 Old myocardial infarction: Secondary | ICD-10-CM | POA: Diagnosis not present

## 2018-09-23 DIAGNOSIS — R279 Unspecified lack of coordination: Secondary | ICD-10-CM | POA: Diagnosis not present

## 2018-09-23 DIAGNOSIS — R269 Unspecified abnormalities of gait and mobility: Secondary | ICD-10-CM | POA: Diagnosis not present

## 2018-09-23 DIAGNOSIS — I25119 Atherosclerotic heart disease of native coronary artery with unspecified angina pectoris: Secondary | ICD-10-CM | POA: Diagnosis not present

## 2018-09-23 DIAGNOSIS — M5001 Cervical disc disorder with myelopathy,  high cervical region: Secondary | ICD-10-CM | POA: Diagnosis not present

## 2018-09-23 DIAGNOSIS — M50022 Cervical disc disorder at C5-C6 level with myelopathy: Secondary | ICD-10-CM | POA: Diagnosis not present

## 2018-09-23 DIAGNOSIS — E785 Hyperlipidemia, unspecified: Secondary | ICD-10-CM | POA: Diagnosis not present

## 2018-09-23 DIAGNOSIS — I447 Left bundle-branch block, unspecified: Secondary | ICD-10-CM | POA: Diagnosis not present

## 2018-09-23 DIAGNOSIS — M4712 Other spondylosis with myelopathy, cervical region: Secondary | ICD-10-CM | POA: Diagnosis not present

## 2018-09-23 DIAGNOSIS — Z4789 Encounter for other orthopedic aftercare: Secondary | ICD-10-CM | POA: Diagnosis not present

## 2018-09-23 DIAGNOSIS — M5000 Cervical disc disorder with myelopathy, unspecified cervical region: Secondary | ICD-10-CM | POA: Diagnosis not present

## 2018-09-23 DIAGNOSIS — I159 Secondary hypertension, unspecified: Secondary | ICD-10-CM | POA: Diagnosis not present

## 2018-09-23 DIAGNOSIS — M1612 Unilateral primary osteoarthritis, left hip: Secondary | ICD-10-CM | POA: Diagnosis not present

## 2018-09-23 DIAGNOSIS — R1084 Generalized abdominal pain: Secondary | ICD-10-CM | POA: Diagnosis not present

## 2018-09-23 DIAGNOSIS — G8918 Other acute postprocedural pain: Secondary | ICD-10-CM | POA: Diagnosis not present

## 2018-09-23 DIAGNOSIS — I4891 Unspecified atrial fibrillation: Secondary | ICD-10-CM | POA: Diagnosis not present

## 2018-09-23 DIAGNOSIS — K219 Gastro-esophageal reflux disease without esophagitis: Secondary | ICD-10-CM | POA: Diagnosis not present

## 2018-09-23 DIAGNOSIS — G894 Chronic pain syndrome: Secondary | ICD-10-CM | POA: Diagnosis not present

## 2018-09-23 DIAGNOSIS — M6281 Muscle weakness (generalized): Secondary | ICD-10-CM | POA: Diagnosis not present

## 2018-09-23 DIAGNOSIS — M4802 Spinal stenosis, cervical region: Secondary | ICD-10-CM | POA: Diagnosis not present

## 2018-09-23 DIAGNOSIS — M069 Rheumatoid arthritis, unspecified: Secondary | ICD-10-CM | POA: Diagnosis not present

## 2018-09-23 DIAGNOSIS — R2689 Other abnormalities of gait and mobility: Secondary | ICD-10-CM | POA: Diagnosis not present

## 2018-09-23 DIAGNOSIS — M5116 Intervertebral disc disorders with radiculopathy, lumbar region: Secondary | ICD-10-CM | POA: Diagnosis not present

## 2018-09-23 DIAGNOSIS — I480 Paroxysmal atrial fibrillation: Secondary | ICD-10-CM | POA: Diagnosis not present

## 2018-09-23 DIAGNOSIS — Z9889 Other specified postprocedural states: Secondary | ICD-10-CM | POA: Diagnosis not present

## 2018-09-23 DIAGNOSIS — R0902 Hypoxemia: Secondary | ICD-10-CM | POA: Diagnosis not present

## 2018-09-23 DIAGNOSIS — I10 Essential (primary) hypertension: Secondary | ICD-10-CM | POA: Diagnosis not present

## 2018-09-23 DIAGNOSIS — J90 Pleural effusion, not elsewhere classified: Secondary | ICD-10-CM | POA: Diagnosis not present

## 2018-09-25 DIAGNOSIS — M4322 Fusion of spine, cervical region: Secondary | ICD-10-CM | POA: Insufficient documentation

## 2018-09-28 ENCOUNTER — Encounter
Admission: RE | Admit: 2018-09-28 | Discharge: 2018-09-28 | Disposition: A | Discharge status: Home / Self Care | Payer: Medicare HMO | Source: Ambulatory Visit | Attending: Internal Medicine | Admitting: Internal Medicine

## 2018-09-30 ENCOUNTER — Ambulatory Visit: Payer: Self-pay

## 2018-09-30 ENCOUNTER — Encounter: Payer: Self-pay | Admitting: Family Medicine

## 2018-10-01 DIAGNOSIS — I25119 Atherosclerotic heart disease of native coronary artery with unspecified angina pectoris: Secondary | ICD-10-CM | POA: Diagnosis not present

## 2018-10-01 DIAGNOSIS — M50022 Cervical disc disorder at C5-C6 level with myelopathy: Secondary | ICD-10-CM | POA: Diagnosis not present

## 2018-10-01 DIAGNOSIS — R269 Unspecified abnormalities of gait and mobility: Secondary | ICD-10-CM | POA: Diagnosis not present

## 2018-10-01 DIAGNOSIS — F339 Major depressive disorder, recurrent, unspecified: Secondary | ICD-10-CM | POA: Diagnosis not present

## 2018-10-01 DIAGNOSIS — I252 Old myocardial infarction: Secondary | ICD-10-CM | POA: Diagnosis not present

## 2018-10-01 DIAGNOSIS — R69 Illness, unspecified: Secondary | ICD-10-CM | POA: Diagnosis not present

## 2018-10-01 DIAGNOSIS — G894 Chronic pain syndrome: Secondary | ICD-10-CM | POA: Diagnosis not present

## 2018-10-01 DIAGNOSIS — R2689 Other abnormalities of gait and mobility: Secondary | ICD-10-CM | POA: Diagnosis not present

## 2018-10-01 DIAGNOSIS — J45909 Unspecified asthma, uncomplicated: Secondary | ICD-10-CM | POA: Diagnosis not present

## 2018-10-01 DIAGNOSIS — I10 Essential (primary) hypertension: Secondary | ICD-10-CM | POA: Diagnosis not present

## 2018-10-01 DIAGNOSIS — G629 Polyneuropathy, unspecified: Secondary | ICD-10-CM | POA: Diagnosis not present

## 2018-10-01 DIAGNOSIS — I4891 Unspecified atrial fibrillation: Secondary | ICD-10-CM | POA: Diagnosis not present

## 2018-10-01 DIAGNOSIS — M5116 Intervertebral disc disorders with radiculopathy, lumbar region: Secondary | ICD-10-CM | POA: Diagnosis not present

## 2018-10-01 DIAGNOSIS — L4050 Arthropathic psoriasis, unspecified: Secondary | ICD-10-CM | POA: Diagnosis not present

## 2018-10-01 DIAGNOSIS — R279 Unspecified lack of coordination: Secondary | ICD-10-CM | POA: Diagnosis not present

## 2018-10-01 DIAGNOSIS — M6281 Muscle weakness (generalized): Secondary | ICD-10-CM | POA: Diagnosis not present

## 2018-10-01 DIAGNOSIS — M4712 Other spondylosis with myelopathy, cervical region: Secondary | ICD-10-CM | POA: Diagnosis not present

## 2018-10-01 DIAGNOSIS — M4802 Spinal stenosis, cervical region: Secondary | ICD-10-CM | POA: Diagnosis not present

## 2018-10-01 DIAGNOSIS — Z4789 Encounter for other orthopedic aftercare: Secondary | ICD-10-CM | POA: Diagnosis not present

## 2018-10-01 DIAGNOSIS — R1084 Generalized abdominal pain: Secondary | ICD-10-CM | POA: Diagnosis not present

## 2018-10-01 DIAGNOSIS — M4322 Fusion of spine, cervical region: Secondary | ICD-10-CM | POA: Diagnosis not present

## 2018-10-04 ENCOUNTER — Other Ambulatory Visit: Payer: Self-pay

## 2018-10-04 DIAGNOSIS — M4322 Fusion of spine, cervical region: Secondary | ICD-10-CM | POA: Diagnosis not present

## 2018-10-04 DIAGNOSIS — I4891 Unspecified atrial fibrillation: Secondary | ICD-10-CM | POA: Diagnosis not present

## 2018-10-04 DIAGNOSIS — I25119 Atherosclerotic heart disease of native coronary artery with unspecified angina pectoris: Secondary | ICD-10-CM | POA: Diagnosis not present

## 2018-10-04 MED ORDER — OXYCODONE HCL 5 MG PO TABS: ORAL_TABLET | ORAL | 0 refills | Status: DC

## 2018-10-04 NOTE — Telephone Encounter (Signed)
Rx sent to [facility] phone : [phone] , fax : [phone]  

## 2018-10-05 ENCOUNTER — Telehealth: Payer: Self-pay

## 2018-10-05 NOTE — Telephone Encounter (Signed)
LMTCB and r/s previously missed AWV. -MM

## 2018-10-08 ENCOUNTER — Other Ambulatory Visit: Payer: Self-pay

## 2018-10-08 ENCOUNTER — Encounter: Payer: Self-pay | Admitting: Adult Health

## 2018-10-08 ENCOUNTER — Non-Acute Institutional Stay (SKILLED_NURSING_FACILITY): Payer: Medicare HMO | Admitting: Adult Health

## 2018-10-08 DIAGNOSIS — J45909 Unspecified asthma, uncomplicated: Secondary | ICD-10-CM | POA: Diagnosis not present

## 2018-10-08 DIAGNOSIS — G629 Polyneuropathy, unspecified: Secondary | ICD-10-CM | POA: Diagnosis not present

## 2018-10-08 DIAGNOSIS — M4802 Spinal stenosis, cervical region: Secondary | ICD-10-CM | POA: Diagnosis not present

## 2018-10-08 DIAGNOSIS — R69 Illness, unspecified: Secondary | ICD-10-CM | POA: Diagnosis not present

## 2018-10-08 DIAGNOSIS — F339 Major depressive disorder, recurrent, unspecified: Secondary | ICD-10-CM

## 2018-10-08 DIAGNOSIS — L4050 Arthropathic psoriasis, unspecified: Secondary | ICD-10-CM | POA: Diagnosis not present

## 2018-10-08 DIAGNOSIS — I4891 Unspecified atrial fibrillation: Secondary | ICD-10-CM

## 2018-10-08 MED ORDER — OXYCODONE HCL 5 MG PO CAPS: 5.0000 mg | ORAL_CAPSULE | ORAL | 0 refills | Status: DC | PRN

## 2018-10-08 NOTE — Progress Notes (Signed)
Location:  [facility] Number: [id number] Place of Service:  SNF [phone]) Provider:  Durenda Age, NP  Patient Care Team: Jerrol Banana., MD as PCP - General (Family Medicine) Wellington Hampshire, MD as PCP - Cardiology (Cardiology) Dasher, Rayvon Char, MD as Consulting Physician (Dermatology) Blanche East, MD as Consulting Physician (Neurosurgery)  Extended Emergency Contact Information Primary Emergency Contact: Zyaira, Vejar          [place], [place] [address] Johnnette Litter of [place] Phone: [phone] Relation: Daughter  Code Status:  Full Code  Goals of care: Advanced Directive information Advanced Directives 01/15/2018  Does Patient Have a Medical Advance Directive? Yes  Type of Advance Directive [place]  Does patient want to make changes to medical advance directive? No - Patient declined  Copy of [place] in Chart? No - copy requested     Chief Complaint  Patient presents with  . Discharge Note    Patient is seen for discharge, going home 10/09/18    HPI:  Pt is a 77 y.o. female seen today for discharge.  She will discharge home on 10/09/18 with home health PT, OT, and CNA services.    She has been admitted to [facility] on 10/01/18 from a recent hospitalization.  She was having right-sided neck stiffness and bilateral upper extremity manual dexterity problems (dropping things, clumsy) and gait instability.  Imaging findings concerning for cervical stenosis.  She had C3-6 posterior cervical fusion with C3-7 decompression on 09/23/2018.  She went into atrial fibrillation with RVR 120s to 140s but remained asymptomatic cardiology was consulted and recommended metoprolol.  TTE on 09/27/2018 showed EF 36%, moderate LV dysfunction and mild RV dysfunction.  Pixel band was resumed on 09/26/2018.  Cardiology recommended followed up as outpatient. She has a PMH of atrial fibrillation  with RVR, GERD, breast cancer, DDD, ischemic cardiomyopathy, and hyperlipidemia.    Patient was admitted to this facility for short-term rehabilitation after the patient's recent hospitalization.  Patient has completed SNF rehabilitation and therapy has cleared the patient for discharge.   Past Medical History:  Diagnosis Date  . Arthritis   . Asthma   . Back pain   . Breast cancer ([place]) 2009   left breast   . CAD (coronary artery disease)    a. 1994 s/p CABG x 1 (LIMA->LAD); b. 03/2015 MV: No ischemia; c. MV 11/18: small fixed apical defect likely secondary to breast attenuation, EF of 42%, frequent PVCs; d. 12/2017 Cath: LM nl, LAD 20p, D1/2/3 nl, LCX min irregs, OM1/2/3 min irregs, RCA nl, RPDA nl, RPL1/2 nl, LIMA->LAD atretic.  Marland Kitchen Dental crowns present    caps- left back top, right back bottom  . Gastroesophageal reflux disease   . Hypertension   . MI (myocardial infarction) ([place]) 1994  . Mixed Ischemic & Nonischemic cardiomyopathy    a. 2013 EF 40%;  b. 03/2015 Echo: EF 55-60%, mild MR, mod dil LA, nl RV fxn; c. 12/18 Echo: EF of 35-40%, hypokinesis of the anteroseptal, and apical myocardium, mild to mod MR, mod dil LA, nl RV fxn, PASP 53 mmHg; d. 12/2017 TEE: EF 35-40%, diff HK, mild to mod MR. small PFO. No LAA/RAA thrombus.  Marland Kitchen PAF (paroxysmal atrial fibrillation) ([place])    a. diagnosed 01/13/2018; b. CHADS2VASc = 6 --> Eliquis; c. 12/2017 s/p TEE/DCCV. Amio started but d/c'd 01/2018 2/2 n/anorexia.  . Personal history of radiation therapy   . Psoriasis   .  PSVT (paroxysmal supraventricular tachycardia) ([place])    a. 02/2015 Holter: short runs of SVT and NSVT.  Marland Kitchen Pulmonary hypertension ([place])   . PVC's (premature ventricular contractions)    a. 03/2018 24h Holter: Freq PVC's with a total of 421 beats in 24 hrs. 7 short runs of SVT likely representing Afib.  . Rheumatoid arthritis (HCC)    feet, hands  . Vertigo    approx 2x/yr   Past Surgical History:  Procedure Laterality Date  .  ABDOMINAL HYSTERECTOMY    . back fusion    . BREAST BIOPSY Right    1991 negative  . BREAST EXCISIONAL BIOPSY Left    2009 positive  . BREAST LUMPECTOMY    . [place] N/A 01/15/2018   Procedure: CARDIOVERSION;  Surgeon: Wellington Hampshire, MD;  Location: [facility];  Service: Cardiovascular;  Laterality: N/A;  . CHOLECYSTECTOMY    . CORONARY ARTERY BYPASS GRAFT  1994   1 vessel - [facility]  . FOOT ARTHRODESIS Right 07/24/2016   Procedure: FUSION FIRST METATARSAL CUNEIFORM JOINT RIGHT FOOT, FUSION SECOND METATARSAL CUNEFORM JOINT BUNION REPAIR RIGHT FOOT;  Surgeon: Albertine Patricia, DPM;  Location: [facility];  Service: Podiatry;  Laterality: Right;  . FOOT FRACTURE SURGERY    . HARDWARE REMOVAL Right 07/24/2016   Procedure: REMOVAL HARDWARE LATERAL MALLEOUS RIGHT ANKLE;  Surgeon: Albertine Patricia, DPM;  Location: [facility];  Service: Podiatry;  Laterality: Right;  REMOVAL OF PIN WHICH WAS INTACT  . HERNIA REPAIR    . RIGHT/LEFT HEART CATH AND CORONARY/GRAFT ANGIOGRAPHY N/A 01/14/2018   Procedure: LEFT HEART CATH AND CORONARY ANGIOGRAPHY;  Surgeon: Wellington Hampshire, MD;  Location: [facility];  Service: Cardiovascular;  Laterality: N/A;  . TEE WITHOUT CARDIOVERSION N/A 01/15/2018   Procedure: TRANSESOPHAGEAL ECHOCARDIOGRAM (TEE);  Surgeon: Wellington Hampshire, MD;  Location: [facility];  Service: Cardiovascular;  Laterality: N/A;  . TOTAL HIP ARTHROPLASTY Left   . TOTAL KNEE ARTHROPLASTY Left     Allergies  Allergen Reactions  . Sulfa Antibiotics Hives  . Ivp Dye [Iodinated Diagnostic Agents] Swelling  . Amiodarone Nausea And Vomiting and Cough  . Codeine Other (See Comments)    ALTERED MENTAL STATUS    Outpatient Encounter Medications as of 10/08/2018  Medication Sig  . acetaminophen (TYLENOL) 325 MG tablet Take 975 mg by mouth every 8 (eight) hours. Three tablets to = 975 mg  . apixaban  (ELIQUIS) 5 MG TABS tablet Take 1 tablet (5 mg total) by mouth 2 (two) times daily.  Marland Kitchen Apremilast (OTEZLA) 30 MG TABS Take 30 mg by mouth 2 (two) times daily.  . budesonide-formoterol (SYMBICORT) 160-4.5 MCG/ACT inhaler Inhale 2 puffs into the lungs 2 (two) times daily.  . fluticasone (FLONASE) 50 MCG/ACT nasal spray Place 2 sprays into both nostrils daily.  Marland Kitchen gabapentin (NEURONTIN) 300 MG capsule Take 600 mg by mouth 3 (three) times daily. Two capsules to = 600 mg  . meclizine (ANTIVERT) 25 MG tablet Take 25 mg by mouth 3 (three) times daily as needed for dizziness.   . methocarbamol (ROBAXIN) 750 MG tablet Take 750 mg by mouth 3 (three) times daily.  . metoprolol tartrate (LOPRESSOR) 100 MG tablet Take 100 mg by mouth 2 (two) times daily.  . montelukast (SINGULAIR) 10 MG tablet Take 10 mg by mouth daily as needed (for allergies).   . Multiple Vitamins-Minerals (MULTIVITAMIN PO) Take  1 tablet by mouth daily.  Marland Kitchen oxycodone (OXY-IR) 5 MG capsule Take 2.5-5 mg by mouth every 4 (four) hours as needed for pain (Take 1/2 to 1 tablet).  . polyethylene glycol (MIRALAX / GLYCOLAX) packet Take 17 g by mouth daily.  . rosuvastatin (CRESTOR) 5 MG tablet TAKE 1 TABLET BY MOUTH EVERY DAY  . sennosides-docusate sodium (SENOKOT-S) 8.6-50 MG tablet Take 2 tablets by mouth 2 (two) times daily.  Marland Kitchen venlafaxine (EFFEXOR) 75 MG tablet Take 2 tablets (150 mg total) by mouth daily.  . [DISCONTINUED] gabapentin (NEURONTIN) 100 MG capsule Take by mouth.  . [DISCONTINUED] azithromycin (ZITHROMAX) 250 MG tablet Take 2 PO day 1 then 1 daily  . [DISCONTINUED] diphenhydrAMINE (BENADRYL) 25 MG tablet Take 25 mg by mouth every 6 (six) hours as needed for allergies.   . [DISCONTINUED] lisinopril (PRINIVIL,ZESTRIL) 10 MG tablet Take 1 tablet (10 mg total) by mouth daily.  . [DISCONTINUED] LORazepam (ATIVAN) 0.5 MG tablet Take 1 tablet (0.5 mg total) by mouth 2 (two) times daily as needed for anxiety.  . [DISCONTINUED] metoprolol  succinate (TOPROL-XL) 50 MG 24 hr tablet Take 1 tablet (50 mg total) by mouth daily. Take with or immediately following a meal.  . [DISCONTINUED] oxyCODONE (ROXICODONE) 5 MG immediate release tablet Take 1/2 to 1 1/2 tablets by mouth every 4 hours as needed  . [DISCONTINUED] Polyethyl Glycol-Propyl Glycol (SYSTANE OP) Place 2 drops into both eyes daily.   . [DISCONTINUED] predniSONE (DELTASONE) 10 MG tablet Take 1 tablet (10 mg total) by mouth daily with breakfast.  . [DISCONTINUED] traMADol (ULTRAM) 50 MG tablet Take by mouth.   No facility-administered encounter medications on file as of 10/08/2018.     Review of Systems  GENERAL: No change in appetite, no fatigue, no weight changes, no fever, chills or weakness MOUTH and THROAT: Denies oral discomfort, gingival pain or bleeding, pain from teeth or hoarseness   RESPIRATORY: no cough, SOB, DOE, wheezing, hemoptysis CARDIAC: No chest pain, edema or palpitations GI: No abdominal pain, diarrhea, constipation, heart burn, nausea or vomiting GU: Denies dysuria, frequency, hematuria, incontinence, or discharge PSYCHIATRIC: Denies feelings of depression or anxiety. No report of hallucinations, insomnia, paranoia, or agitation   Immunization History  Administered Date(s) Administered  . Influenza Whole 08/22/2018  . Influenza, High Dose Seasonal PF 10/04/2015, 10/06/2016, 09/16/2017, 08/31/2018  . Influenza-Unspecified 07/22/2014  . Pneumococcal Conjugate-13 10/06/2016   Pertinent  Health Maintenance Due  Topic Date Due  . PNA vac Low Risk Adult (2 of 2 - PPSV23) 10/06/2017  . INFLUENZA VACCINE  Completed  . DEXA SCAN  Completed   Fall Risk  09/16/2017 10/06/2016 10/04/2015 07/16/2015  Falls in the past year? No No No No     Vitals:   10/08/18 0813  BP: 118/70  Pulse: 89  Resp: 16  Temp: 98.1 F (36.7 C)  TempSrc: Oral  SpO2: 96%  Weight: 173 lb (78.5 kg)  Height: 5\' 4"  (1.626 m)   Body mass index is 29.7 kg/m.  Physical  Exam  GENERAL APPEARANCE: Well nourished. In no acute distress. Normal body habitus SKIN:  Back of neck surgical incision has 2 steri-strips, dry and no redness MOUTH and THROAT: Lips are without lesions. Oral mucosa is moist and without lesions. Tongue is normal in shape, size, and color and without lesions RESPIRATORY: Breathing is even & unlabored, BS CTAB CARDIAC: Irregular heart rate, no murmur,no extra heart sounds, no edema GI: Abdomen soft, normal BS, no masses, no tenderness EXTREMITIES:  Able to move X 4 extremities NEUROLOGICAL: There is no tremor. Speech is clear PSYCHIATRIC: Alert and oriented X 3. Affect and behavior are appropriate  Labs reviewed: Recent Labs    01/13/18 1140 01/14/18 0513 03/19/18 1509  NA 142 139 140  K 4.1 4.6 4.3  CL 109 109 106  CO2 26 20* 24  GLUCOSE 99 160* 93  BUN 12 16 21*  CREATININE 0.70 0.53 0.75  CALCIUM 9.3 9.6 9.6  MG 1.9  --   --    Recent Labs    10/21/17 1527  AST 19  ALT 15  BILITOT 0.6  PROT 7.0   Recent Labs    10/21/17 1527  01/13/18 1140 01/14/18 0626 01/15/18 0200  WBC 7.1   < > 6.6 3.7 10.1  NEUTROABS 3,607  --   --   --   --   HGB 15.4   < > 15.9 15.5 14.5  HCT 47.2*   < > 49.0* 47.1* 44.0  MCV 88.6   < > 91.1 91.1 90.7  PLT 278   < > 222 190 187   < > = values in this interval not displayed.   Lab Results  Component Value Date   TSH 1.468 01/13/2018   Lab Results  Component Value Date   CHOL 157 06/23/2017   HDL 62 06/23/2017   LDLCALC 80 06/23/2017   TRIG 77 06/23/2017   CHOLHDL 2.5 06/23/2017    Assessment/Plan  1. Cervical stenosis of spine -S/P C3-6 posterior cervical fusion with C3-7 decompression on 09/23/2018, continue neck brace, follow-up with neurosurgeon, continue Robaxin 750 mg 1 tab 3 times a day as needed, oxycodone 5 mg 1/2 - 1 tab every 4 hours as needed for pain   2. Neuropathy - continue gabapentin 300 mg 2 capsules = 600 mg every 8 hours   3. Atrial fibrillation with  rapid ventricular response (HCC) -rate controlled, continue metoprolol tartrate 100 mg 1 tab every 12 hours, Eliquis 5 mg 1 tab twice a day, follow up with cardiology   4. Depression, recurrent ([place]) - mood this is stable, continue venlafaxine 75 mg 1 tab twice a day   5. Psoriatic arthritis ([place]) -continue Otezla 30 mg 1 tab twice a day  6.  Asthma -no wheezing nor S OB, continue montelukast 10 mg 1 tab daily as needed and Symbicort HFA aerosol inhaler 160-4.5 mcg/ACT 2 inhalations twice a day as needed     I have filled out patient's discharge paperwork and written prescriptions.  Patient will receive home health PT, OT, ST, nursing and CNA.  DME provided:  None  Total discharge time: Greater than 30 minutes Greater than 50% was spent in counseling and coordination of care.  Discharge time involved coordination of the discharge process with social worker, nursing staff and therapy department. Medical justification for home health services verified.    Durenda Age, NP [facility] [phone] (Monday-Friday 8:00 a.m. - 5:00 p.m.) [phone] (after hours)

## 2018-10-10 ENCOUNTER — Other Ambulatory Visit: Payer: Self-pay

## 2018-10-10 ENCOUNTER — Inpatient Hospital Stay
Admission: EM | Admit: 2018-10-10 | Discharge: 2018-10-13 | DRG: 309 | Disposition: A | Discharge status: Home Health | Payer: Medicare HMO | Attending: Internal Medicine | Admitting: Internal Medicine

## 2018-10-10 DIAGNOSIS — Q211 Atrial septal defect: Secondary | ICD-10-CM | POA: Diagnosis not present

## 2018-10-10 DIAGNOSIS — Z888 Allergy status to other drugs, medicaments and biological substances status: Secondary | ICD-10-CM

## 2018-10-10 DIAGNOSIS — Z885 Allergy status to narcotic agent status: Secondary | ICD-10-CM

## 2018-10-10 DIAGNOSIS — M2550 Pain in unspecified joint: Secondary | ICD-10-CM | POA: Diagnosis present

## 2018-10-10 DIAGNOSIS — I959 Hypotension, unspecified: Secondary | ICD-10-CM | POA: Diagnosis not present

## 2018-10-10 DIAGNOSIS — J45909 Unspecified asthma, uncomplicated: Secondary | ICD-10-CM | POA: Diagnosis present

## 2018-10-10 DIAGNOSIS — M069 Rheumatoid arthritis, unspecified: Secondary | ICD-10-CM | POA: Insufficient documentation

## 2018-10-10 DIAGNOSIS — I110 Hypertensive heart disease with heart failure: Secondary | ICD-10-CM | POA: Diagnosis present

## 2018-10-10 DIAGNOSIS — I25119 Atherosclerotic heart disease of native coronary artery with unspecified angina pectoris: Secondary | ICD-10-CM | POA: Diagnosis not present

## 2018-10-10 DIAGNOSIS — L4050 Arthropathic psoriasis, unspecified: Secondary | ICD-10-CM | POA: Diagnosis not present

## 2018-10-10 DIAGNOSIS — Z923 Personal history of irradiation: Secondary | ICD-10-CM

## 2018-10-10 DIAGNOSIS — Z87891 Personal history of nicotine dependence: Secondary | ICD-10-CM

## 2018-10-10 DIAGNOSIS — Z981 Arthrodesis status: Secondary | ICD-10-CM

## 2018-10-10 DIAGNOSIS — G8929 Other chronic pain: Secondary | ICD-10-CM | POA: Diagnosis present

## 2018-10-10 DIAGNOSIS — M542 Cervicalgia: Secondary | ICD-10-CM | POA: Diagnosis present

## 2018-10-10 DIAGNOSIS — Z8679 Personal history of other diseases of the circulatory system: Secondary | ICD-10-CM

## 2018-10-10 DIAGNOSIS — Z96642 Presence of left artificial hip joint: Secondary | ICD-10-CM | POA: Diagnosis present

## 2018-10-10 DIAGNOSIS — I4819 Other persistent atrial fibrillation: Secondary | ICD-10-CM | POA: Diagnosis not present

## 2018-10-10 DIAGNOSIS — M4322 Fusion of spine, cervical region: Secondary | ICD-10-CM | POA: Diagnosis not present

## 2018-10-10 DIAGNOSIS — I447 Left bundle-branch block, unspecified: Secondary | ICD-10-CM | POA: Diagnosis present

## 2018-10-10 DIAGNOSIS — L409 Psoriasis, unspecified: Secondary | ICD-10-CM | POA: Diagnosis not present

## 2018-10-10 DIAGNOSIS — R42 Dizziness and giddiness: Secondary | ICD-10-CM | POA: Diagnosis present

## 2018-10-10 DIAGNOSIS — M4326 Fusion of spine, lumbar region: Secondary | ICD-10-CM | POA: Diagnosis not present

## 2018-10-10 DIAGNOSIS — K219 Gastro-esophageal reflux disease without esophagitis: Secondary | ICD-10-CM | POA: Diagnosis present

## 2018-10-10 DIAGNOSIS — I272 Pulmonary hypertension, unspecified: Secondary | ICD-10-CM | POA: Diagnosis not present

## 2018-10-10 DIAGNOSIS — I428 Other cardiomyopathies: Secondary | ICD-10-CM | POA: Diagnosis present

## 2018-10-10 DIAGNOSIS — Z96652 Presence of left artificial knee joint: Secondary | ICD-10-CM | POA: Diagnosis present

## 2018-10-10 DIAGNOSIS — Z951 Presence of aortocoronary bypass graft: Secondary | ICD-10-CM

## 2018-10-10 DIAGNOSIS — I493 Ventricular premature depolarization: Secondary | ICD-10-CM | POA: Diagnosis present

## 2018-10-10 DIAGNOSIS — I4891 Unspecified atrial fibrillation: Secondary | ICD-10-CM | POA: Diagnosis not present

## 2018-10-10 DIAGNOSIS — I252 Old myocardial infarction: Secondary | ICD-10-CM

## 2018-10-10 DIAGNOSIS — I5022 Chronic systolic (congestive) heart failure: Secondary | ICD-10-CM | POA: Diagnosis not present

## 2018-10-10 DIAGNOSIS — Z7901 Long term (current) use of anticoagulants: Secondary | ICD-10-CM

## 2018-10-10 DIAGNOSIS — E785 Hyperlipidemia, unspecified: Secondary | ICD-10-CM | POA: Diagnosis present

## 2018-10-10 DIAGNOSIS — Z882 Allergy status to sulfonamides status: Secondary | ICD-10-CM

## 2018-10-10 DIAGNOSIS — I10 Essential (primary) hypertension: Secondary | ICD-10-CM | POA: Diagnosis not present

## 2018-10-10 DIAGNOSIS — Z4789 Encounter for other orthopedic aftercare: Secondary | ICD-10-CM | POA: Diagnosis not present

## 2018-10-10 DIAGNOSIS — R000 Tachycardia, unspecified: Secondary | ICD-10-CM | POA: Diagnosis not present

## 2018-10-10 DIAGNOSIS — I255 Ischemic cardiomyopathy: Secondary | ICD-10-CM | POA: Diagnosis present

## 2018-10-10 DIAGNOSIS — I25728 Atherosclerosis of autologous artery coronary artery bypass graft(s) with other forms of angina pectoris: Secondary | ICD-10-CM | POA: Diagnosis not present

## 2018-10-10 DIAGNOSIS — Z79899 Other long term (current) drug therapy: Secondary | ICD-10-CM

## 2018-10-10 DIAGNOSIS — Z853 Personal history of malignant neoplasm of breast: Secondary | ICD-10-CM

## 2018-10-10 DIAGNOSIS — Z91041 Radiographic dye allergy status: Secondary | ICD-10-CM

## 2018-10-10 DIAGNOSIS — I2510 Atherosclerotic heart disease of native coronary artery without angina pectoris: Secondary | ICD-10-CM | POA: Diagnosis not present

## 2018-10-10 DIAGNOSIS — M1990 Unspecified osteoarthritis, unspecified site: Secondary | ICD-10-CM | POA: Diagnosis present

## 2018-10-10 DIAGNOSIS — G894 Chronic pain syndrome: Secondary | ICD-10-CM | POA: Diagnosis not present

## 2018-10-10 DIAGNOSIS — Z7951 Long term (current) use of inhaled steroids: Secondary | ICD-10-CM

## 2018-10-10 DIAGNOSIS — I340 Nonrheumatic mitral (valve) insufficiency: Secondary | ICD-10-CM | POA: Diagnosis present

## 2018-10-10 LAB — CBC WITH DIFFERENTIAL/PLATELET
Abs Immature Granulocytes: 0.03 10*3/uL (ref 0.00–0.07)
Basophils Absolute: 0.1 10*3/uL (ref 0.0–0.1)
Basophils Relative: 1 %
Eosinophils Absolute: 0.3 10*3/uL (ref 0.0–0.5)
Eosinophils Relative: 3 %
HCT: 38.6 % (ref 36.0–46.0)
Hemoglobin: 12.1 g/dL (ref 12.0–15.0)
Immature Granulocytes: 0 %
Lymphocytes Relative: 18 %
Lymphs Abs: 1.6 10*3/uL (ref 0.7–4.0)
MCH: 30.5 pg (ref 26.0–34.0)
MCHC: 31.3 g/dL (ref 30.0–36.0)
MCV: 97.2 fL (ref 80.0–100.0)
Monocytes Absolute: 1.1 10*3/uL — ABNORMAL HIGH (ref 0.1–1.0)
Monocytes Relative: 13 %
Neutro Abs: 5.7 10*3/uL (ref 1.7–7.7)
Neutrophils Relative %: 65 %
Platelets: 268 10*3/uL (ref 150–400)
RBC: 3.97 MIL/uL (ref 3.87–5.11)
RDW: 13.4 % (ref 11.5–15.5)
WBC: 8.7 10*3/uL (ref 4.0–10.5)
nRBC: 0 % (ref 0.0–0.2)

## 2018-10-10 LAB — BASIC METABOLIC PANEL
Anion gap: 6 (ref 5–15)
BUN: 15 mg/dL (ref 8–23)
CO2: 28 mmol/L (ref 22–32)
Calcium: 8.8 mg/dL — ABNORMAL LOW (ref 8.9–10.3)
Chloride: 103 mmol/L (ref 98–111)
Creatinine, Ser: 0.62 mg/dL (ref 0.44–1.00)
GFR calc Af Amer: >60 mL/min (ref >60)
GFR calc non Af Amer: >60 mL/min (ref >60)
Glucose, Bld: 123 mg/dL — ABNORMAL HIGH (ref 70–99)
Potassium: 4.1 mmol/L (ref 3.5–5.1)
Sodium: 137 mmol/L (ref 135–145)

## 2018-10-10 LAB — TROPONIN I: Troponin I: <0.03 ng/mL (ref <0.03)

## 2018-10-10 MED ORDER — METOPROLOL TARTRATE 50 MG PO TABS
100.0000 mg | ORAL_TABLET | Freq: Two times a day (BID) | ORAL | Status: DC
  Administered 2018-10-11: 100 mg via ORAL
  Filled 2018-10-10 (×2): qty 2

## 2018-10-10 MED ORDER — METOPROLOL TARTRATE 50 MG PO TABS
100.0000 mg | ORAL_TABLET | Freq: Once | ORAL | Status: AC
  Administered 2018-10-10: 100 mg via ORAL
  Filled 2018-10-10: qty 2

## 2018-10-10 MED ORDER — ACETAMINOPHEN 325 MG PO TABS
650.0000 mg | ORAL_TABLET | Freq: Four times a day (QID) | ORAL | Status: DC | PRN
  Administered 2018-10-12 – 2018-10-13 (×2): 650 mg via ORAL
  Filled 2018-10-10 (×2): qty 2

## 2018-10-10 MED ORDER — ONDANSETRON HCL 4 MG PO TABS: 4.0000 mg | ORAL_TABLET | Freq: Four times a day (QID) | ORAL | Status: DC | PRN

## 2018-10-10 MED ORDER — ROSUVASTATIN CALCIUM 5 MG PO TABS: 5.0000 mg | ORAL_TABLET | Freq: Every day | ORAL | Status: DC

## 2018-10-10 MED ORDER — OXYCODONE-ACETAMINOPHEN 5-325 MG PO TABS
1.0000 | ORAL_TABLET | Freq: Once | ORAL | Status: AC
  Administered 2018-10-10: 1 via ORAL
  Filled 2018-10-10: qty 1

## 2018-10-10 MED ORDER — DILTIAZEM HCL-DEXTROSE 100-5 MG/100ML-% IV SOLN (PREMIX)
5.0000 mg/h | Freq: Once | INTRAVENOUS | Status: AC
  Administered 2018-10-10: 5 mg/h via INTRAVENOUS
  Filled 2018-10-10: qty 100

## 2018-10-10 MED ORDER — APIXABAN 5 MG PO TABS
5.0000 mg | ORAL_TABLET | Freq: Two times a day (BID) | ORAL | Status: DC
  Administered 2018-10-10 – 2018-10-13 (×6): 5 mg via ORAL
  Filled 2018-10-10 (×6): qty 1

## 2018-10-10 MED ORDER — ACETAMINOPHEN 650 MG RE SUPP: 650.0000 mg | Freq: Four times a day (QID) | RECTAL | Status: DC | PRN

## 2018-10-10 MED ORDER — MONTELUKAST SODIUM 10 MG PO TABS: 10.0000 mg | ORAL_TABLET | Freq: Every day | ORAL | Status: DC | PRN

## 2018-10-10 MED ORDER — MOMETASONE FURO-FORMOTEROL FUM 200-5 MCG/ACT IN AERO
2.0000 | INHALATION_SPRAY | Freq: Two times a day (BID) | RESPIRATORY_TRACT | Status: DC
  Administered 2018-10-11 – 2018-10-13 (×5): 2 via RESPIRATORY_TRACT
  Filled 2018-10-10: qty 8.8

## 2018-10-10 MED ORDER — DILTIAZEM HCL-DEXTROSE 100-5 MG/100ML-% IV SOLN (PREMIX)
5.0000 mg/h | INTRAVENOUS | Status: DC
  Filled 2018-10-10: qty 100

## 2018-10-10 MED ORDER — VENLAFAXINE HCL ER 75 MG PO CP24
150.0000 mg | ORAL_CAPSULE | Freq: Every day | ORAL | Status: DC
  Administered 2018-10-11 – 2018-10-13 (×3): 150 mg via ORAL
  Filled 2018-10-10 (×3): qty 2

## 2018-10-10 MED ORDER — ONDANSETRON HCL 4 MG/2ML IJ SOLN: 4.0000 mg | Freq: Four times a day (QID) | INTRAMUSCULAR | Status: DC | PRN

## 2018-10-10 MED ORDER — APREMILAST 30 MG PO TABS
30.0000 mg | ORAL_TABLET | Freq: Two times a day (BID) | ORAL | Status: DC
  Administered 2018-10-11 – 2018-10-12 (×3): 30 mg via ORAL
  Filled 2018-10-10 (×4): qty 1

## 2018-10-10 MED ORDER — GABAPENTIN 300 MG PO CAPS
600.0000 mg | ORAL_CAPSULE | Freq: Three times a day (TID) | ORAL | Status: DC
  Administered 2018-10-10 – 2018-10-13 (×8): 600 mg via ORAL
  Filled 2018-10-10 (×8): qty 2

## 2018-10-10 MED ORDER — SENNOSIDES-DOCUSATE SODIUM 8.6-50 MG PO TABS
2.0000 | ORAL_TABLET | Freq: Two times a day (BID) | ORAL | Status: DC
  Administered 2018-10-10 – 2018-10-13 (×6): 2 via ORAL
  Filled 2018-10-10 (×6): qty 2

## 2018-10-10 MED ORDER — OXYCODONE HCL 5 MG PO TABS
5.0000 mg | ORAL_TABLET | ORAL | Status: DC | PRN
  Administered 2018-10-10 – 2018-10-13 (×6): 5 mg via ORAL
  Filled 2018-10-10 (×6): qty 1

## 2018-10-10 NOTE — ED Provider Notes (Signed)
[facility] Emergency Department Provider Note  ___________________________________________   First MD Initiated Contact with Patient 10/10/18 1928     (approximate)  I have reviewed the triage vital signs and the nursing notes.   HISTORY  Chief Complaint Tachycardia   HPI Teresa [name] is a 77 y.o. female with a history of CAD, atrial fibrillation on Eliquis as well as a recent spinal fusion was presented to the emergency department with rapid heart rate.  Says that she was first diagnosed with atrial for ablation this past January.  Was originally started on 50 mg of metoprolol.  However, went to rapid A. fib recently during an admission for her spinal fusion and was increased to 100 mg twice daily.  However, it is unclear since being discharged from rehab this past Friday she has been taking the correct dose of medications.  She is supposed to be taking 100 mg twice daily but her bottles of metoprolol save 50 mg once per day.  Unclear which preparation if this is the tartrate or the succinate.  Patient denying any abdominal or chest pain.  Denies any shortness of breath.  Denies any neck pain.   Past Medical History:  Diagnosis Date  . Arthritis   . Asthma   . Back pain   . Breast cancer ([place]) 2009   left breast   . CAD (coronary artery disease)    a. 1994 s/p CABG x 1 (LIMA->LAD); b. 03/2015 MV: No ischemia; c. MV 11/18: small fixed apical defect likely secondary to breast attenuation, EF of 42%, frequent PVCs; d. 12/2017 Cath: LM nl, LAD 20p, D1/2/3 nl, LCX min irregs, OM1/2/3 min irregs, RCA nl, RPDA nl, RPL1/2 nl, LIMA->LAD atretic.  Marland Kitchen Dental crowns present    caps- left back top, right back bottom  . Gastroesophageal reflux disease   . Hypertension   . MI (myocardial infarction) ([place]) 1994  . Mixed Ischemic & Nonischemic cardiomyopathy    a. 2013 EF 40%;  b. 03/2015 Echo: EF 55-60%, mild MR, mod dil LA, nl RV fxn; c. 12/18 Echo: EF of 35-40%,  hypokinesis of the anteroseptal, and apical myocardium, mild to mod MR, mod dil LA, nl RV fxn, PASP 53 mmHg; d. 12/2017 TEE: EF 35-40%, diff HK, mild to mod MR. small PFO. No LAA/RAA thrombus.  Marland Kitchen PAF (paroxysmal atrial fibrillation) ([place])    a. diagnosed 01/13/2018; b. CHADS2VASc = 6 --> Eliquis; c. 12/2017 s/p TEE/DCCV. Amio started but d/c'd 01/2018 2/2 n/anorexia.  . Personal history of radiation therapy   . Psoriasis   . PSVT (paroxysmal supraventricular tachycardia) ([place])    a. 02/2015 Holter: short runs of SVT and NSVT.  Marland Kitchen Pulmonary hypertension ([place])   . PVC's (premature ventricular contractions)    a. 03/2018 24h Holter: Freq PVC's with a total of 421 beats in 24 hrs. 7 short runs of SVT likely representing Afib.  . Rheumatoid arthritis (HCC)    feet, hands  . Vertigo    approx 2x/yr    Patient Active Problem List   Diagnosis Date Noted  . Psoriatic arthritis ([place]) 03/04/2018  . Unstable angina ([place])   . Atrial fibrillation with rapid ventricular response ([place]) 01/13/2018  . 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 Asthma 05/09/2017  . 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 Arthralgia of both hands 01/31/2016  . Ventricular premature depolarization 09/11/2015  . Psoriasis 07/16/2015  . 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 Hyperlipidemia 03/29/2015  .  Pre-syncope 03/04/2015  . CAD (coronary artery disease)   . PVC's (premature ventricular contractions)   . Ischemic cardiomyopathy   . DDD (degenerative disc disease), lumbar 01/18/2015  . Intervertebral disc disorder with radiculopathy of lumbar region 12/26/2014  . L-S radiculopathy 07/03/2014  . Breast CA ([place]) 06/28/2014  . Arthritis, degenerative 06/28/2014  . Acid reflux 03/08/2014  . 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    Past Surgical History:  Procedure Laterality Date  . ABDOMINAL HYSTERECTOMY    . back fusion    . BREAST BIOPSY Right    1991  negative  . BREAST EXCISIONAL BIOPSY Left    2009 positive  . BREAST LUMPECTOMY    . [place] N/A 01/15/2018   Procedure: CARDIOVERSION;  Surgeon: Wellington Hampshire, MD;  Location: [facility] ORS;  Service: Cardiovascular;  Laterality: N/A;  . CHOLECYSTECTOMY    . CORONARY ARTERY BYPASS GRAFT  1994   1 vessel - [facility]  . FOOT ARTHRODESIS Right 07/24/2016   Procedure: FUSION FIRST METATARSAL CUNEIFORM JOINT RIGHT FOOT, FUSION SECOND METATARSAL CUNEFORM JOINT BUNION REPAIR RIGHT FOOT;  Surgeon: Albertine Patricia, DPM;  Location: [place];  Service: Podiatry;  Laterality: Right;  . FOOT FRACTURE SURGERY    . HARDWARE REMOVAL Right 07/24/2016   Procedure: REMOVAL HARDWARE LATERAL MALLEOUS RIGHT ANKLE;  Surgeon: Albertine Patricia, DPM;  Location: [place];  Service: Podiatry;  Laterality: Right;  REMOVAL OF PIN WHICH WAS INTACT  . HERNIA REPAIR    . NECK SURGERY    . RIGHT/LEFT HEART CATH AND CORONARY/GRAFT ANGIOGRAPHY N/A 01/14/2018   Procedure: LEFT HEART CATH AND CORONARY ANGIOGRAPHY;  Surgeon: Wellington Hampshire, MD;  Location: [place] CV [facility];  Service: Cardiovascular;  Laterality: N/A;  . TEE WITHOUT CARDIOVERSION N/A 01/15/2018   Procedure: TRANSESOPHAGEAL ECHOCARDIOGRAM (TEE);  Surgeon: Wellington Hampshire, MD;  Location: [facility] ORS;  Service: Cardiovascular;  Laterality: N/A;  . TOTAL HIP ARTHROPLASTY Left   . TOTAL KNEE ARTHROPLASTY Left     Prior to Admission medications   Medication Sig Start Date End Date Taking? Authorizing Provider  acetaminophen (TYLENOL) 325 MG tablet Take 975 mg by mouth every 8 (eight) hours. Three tablets to = 975 mg    [provider]  apixaban (ELIQUIS) 5 MG TABS tablet Take 1 tablet (5 mg total) by mouth 2 (two) times daily. 03/05/18   Minna Merritts, MD  Apremilast (OTEZLA) 30 MG TABS Take 30 mg by mouth 2 (two) times daily.    [provider]    budesonide-formoterol (SYMBICORT) 160-4.5 MCG/ACT inhaler Inhale 2 puffs into the lungs 2 (two) times daily. 10/06/16   Jerrol Banana., MD  fluticasone [facility]) 50 MCG/ACT nasal spray Place 2 sprays into both nostrils daily.    [provider]  gabapentin (NEURONTIN) 300 MG capsule Take 600 mg by mouth 3 (three) times daily. Two capsules to = 600 mg    [provider]  meclizine (ANTIVERT) 25 MG tablet Take 25 mg by mouth 3 (three) times daily as needed for dizziness.  04/03/15   [provider]  methocarbamol (ROBAXIN) 750 MG tablet Take 750 mg by mouth 3 (three) times daily.    [provider]  metoprolol tartrate (LOPRESSOR) 100 MG tablet Take 100 mg by mouth 2 (two) times daily.    [provider]  montelukast (SINGULAIR) 10 MG tablet  Take 10 mg by mouth daily as needed (for allergies).  04/02/15   [provider]  Multiple Vitamins-Minerals (MULTIVITAMIN PO) Take 1 tablet by mouth daily.    [provider]  oxycodone (OXY-IR) 5 MG capsule Take 1 capsule (5 mg total) by mouth every 4 (four) hours as needed for pain (Take 1/2 to 1 tablet). 10/08/18   Medina-Vargas, Monina C, NP  polyethylene glycol (MIRALAX / GLYCOLAX) packet Take 17 g by mouth daily.    [provider]  rosuvastatin (CRESTOR) 5 MG tablet TAKE 1 TABLET BY MOUTH EVERY DAY 09/23/18   Wellington Hampshire, MD  sennosides-docusate sodium (SENOKOT-S) 8.6-50 MG tablet Take 2 tablets by mouth 2 (two) times daily.    [provider]  venlafaxine (EFFEXOR) 75 MG tablet Take 2 tablets (150 mg total) by mouth daily. 07/09/18   Jerrol Banana., MD    Allergies Sulfa antibiotics; Ivp dye [iodinated diagnostic agents]; Amiodarone; and Codeine  Family History  Problem Relation Age of Onset  . Heart attack Father   . Congestive Heart Failure Mother   . Breast cancer Mother 94  . Kidney failure Sister   . Prostate cancer Brother   . Bladder Cancer  Brother   . Lung cancer Sister   . Arthritis Sister   . Breast cancer Sister   . Arthritis Sister   . Breast cancer Sister   . Lung cancer Sister   . Lung cancer Brother   . Arthritis Brother     Social History Social History   Tobacco Use  . Smoking status: Former Smoker    Packs/day: 0.50    Years: 35.00    Pack years: 17.50    Types: Cigarettes  . Smokeless tobacco: Former Systems developer    Quit date: 12/22/1978  . Tobacco comment: Quit approx 1990  Substance Use Topics  . Alcohol use: No  . Drug use: No    Review of Systems  Constitutional: No fever/chills Eyes: No visual changes. ENT: No sore throat. Cardiovascular: As above Respiratory: Denies shortness of breath. Gastrointestinal: No abdominal pain.  No nausea, no vomiting.  No diarrhea.  No constipation. Genitourinary: Negative for dysuria. Musculoskeletal: Negative for back pain. Skin: Negative for rash. Neurological: Negative for headaches, focal weakness or numbness.   ____________________________________________   PHYSICAL EXAM:  VITAL SIGNS: ED Triage Vitals  Enc Vitals Group     BP 10/10/18 1924 (!) 126/91     Pulse Rate 10/10/18 1924 (!) 134     Resp 10/10/18 1924 18     Temp 10/10/18 1924 98 F (36.7 C)     Temp Source 10/10/18 1924 Oral     SpO2 10/10/18 1924 97 %     Weight 10/10/18 1925 173 lb (78.5 kg)     Height 10/10/18 1925 _0  (1.626 m)     Head Circumference --      Peak Flow --      Pain Score 10/10/18 1925 0     Pain Loc --      Pain Edu? --      Excl. in [place]? --     Constitutional: Alert and oriented. Well appearing and in no acute distress. Eyes: Conjunctivae are normal.  Head: Atraumatic. Nose: No congestion/rhinnorhea. Mouth/Throat: Mucous membranes are moist.  Neck: No stridor.  Patient wearing [place] J collar. Cardiovascular: Tachycardic with an irregularly irregular rhythm. Grossly normal heart sounds.  Respiratory: Normal respiratory effort.  No retractions. Lungs  CTAB. Gastrointestinal: Soft and  nontender. No distention.  Musculoskeletal: No lower extremity tenderness nor edema.  No joint effusions. Neurologic:  Normal speech and language. No gross focal neurologic deficits are appreciated. Skin:  Skin is warm, dry and intact. No rash noted. Psychiatric: Mood and affect are normal. Speech and behavior are normal.  ____________________________________________   LABS (all labs ordered are listed, but only abnormal results are displayed)  Labs Reviewed  CBC WITH DIFFERENTIAL/PLATELET - Abnormal; Notable for the following components:      Result Value   Monocytes Absolute 1.1 (*)    All other components within normal limits  BASIC METABOLIC PANEL - Abnormal; Notable for the following components:   Glucose, Bld 123 (*)    Calcium 8.8 (*)    All other components within normal limits  TROPONIN I   ____________________________________________  EKG  ED ECG REPORT I, Doran Stabler, the attending physician, personally viewed and interpreted this ECG.   Date: 10/10/2018  EKG Time: 1924  Rate: 144  Rhythm: atrial fibrillation, rate 144  Axis: Normal  Intervals: Wide-complex consistent with left bundle branch block  ST&T Change: ST elevation consistent with left bundle branch block without criteria being met for [place] criteria.  T wave inversions in 1 and aVL.  No significant change from previous.  ____________________________________________  [id number]   ____________________________________________   PROCEDURES  Procedure(s) performed:   .Critical Care Performed by: Orbie Pyo, MD Authorized by: Orbie Pyo, MD   Critical care provider statement:    Critical care time (minutes):  35   Critical care time was exclusive of:  Separately billable procedures and treating other patients   Critical care was necessary to treat or prevent imminent or life-threatening deterioration of the following  conditions:  Circulatory failure   Critical care was time spent personally by me on the following activities:  Development of treatment plan with patient or surrogate, discussions with consultants, evaluation of patient's response to treatment, examination of patient, obtaining history from patient or surrogate, ordering and performing treatments and interventions, ordering and review of laboratory studies, ordering and review of radiographic studies, pulse oximetry, re-evaluation of patient's condition and review of old charts   Critical Care performed:   ____________________________________________   INITIAL IMPRESSION / [place] / ED COURSE  Pertinent labs & imaging results that were available during my care of the patient were reviewed by me and considered in my medical decision making (see chart for details).  DDX: Atrial for ablation with rapid ventricular response, less light abnormality, ACS, medication error As part of my medical decision making, I reviewed the following data within the [place] Notes from prior ED visits  ----------------------------------------- 9:54 PM on 10/10/2018 -----------------------------------------  Patient persistently symptomatic with heart rate up to the 130s and 140s despite 100 mg of metoprolol.  Still says that she feels her heart pounding and that her body is shaking.  Discussed case Dr. Velva Harman who recommends placing the patient on a Cardizem drip and admitted to the hospital.  Discussed case with Dr. Jannifer Franklin of the hospitalist service will be admitting the patient.  Patient understand the diagnosis as well as treatment plan willing to comply. ____________________________________________   FINAL CLINICAL IMPRESSION(S) / ED DIAGNOSES  Atrial fibrillation with RVR.  NEW MEDICATIONS STARTED DURING THIS VISIT:  New Prescriptions   No medications on file     Note:  This document was prepared using Dragon voice  recognition software and may include unintentional dictation errors.  Orbie Pyo, MD 10/10/18 2155

## 2018-10-10 NOTE — ED Notes (Signed)
Placed pt in chair for comfort

## 2018-10-10 NOTE — H&P (Signed)
[facility] at [place] NAME: Teresa [name]    MR#:  [id number]  DATE OF BIRTH:  02/02/1941  DATE OF ADMISSION:  10/10/2018  PRIMARY CARE PHYSICIAN: Jerrol Banana., MD   REQUESTING/REFERRING PHYSICIAN: Clearnce Hasten, MD  CHIEF COMPLAINT:   Chief Complaint  Patient presents with  . Tachycardia    HISTORY OF PRESENT ILLNESS:  Teresa [name]  is a 77 y.o. female who presents with chief complaint as above. Diagnosed with A fib and had RVR requiring cardioversion early this year.  Recent episode of RVR during spinal fusion procedure a few weeks ago.  Had her metoprolol dose increased, but did not have the higher dose filled at home yet.  Presents tonight with palpitations and some dyspnea, in Afib RVR.  Given metoprolol 100 mg without rate control.  Started on cardizem gtt in ED on recommendation of her cardiologist, hospitalists called for admission,   PAST MEDICAL HISTORY:   Past Medical History:  Diagnosis Date  . Arthritis   . Asthma   . Back pain   . Breast cancer ([place]) 2009   left breast   . CAD (coronary artery disease)    a. 1994 s/p CABG x 1 (LIMA->LAD); b. 03/2015 MV: No ischemia; c. MV 11/18: small fixed apical defect likely secondary to breast attenuation, EF of 42%, frequent PVCs; d. 12/2017 Cath: LM nl, LAD 20p, D1/2/3 nl, LCX min irregs, OM1/2/3 min irregs, RCA nl, RPDA nl, RPL1/2 nl, LIMA->LAD atretic.  Marland Kitchen Dental crowns present    caps- left back top, right back bottom  . Gastroesophageal reflux disease   . Hypertension   . MI (myocardial infarction) ([place]) 1994  . Mixed Ischemic & Nonischemic cardiomyopathy    a. 2013 EF 40%;  b. 03/2015 Echo: EF 55-60%, mild MR, mod dil LA, nl RV fxn; c. 12/18 Echo: EF of 35-40%, hypokinesis of the anteroseptal, and apical myocardium, mild to mod MR, mod dil LA, nl RV fxn, PASP 53 mmHg; d. 12/2017 TEE: EF 35-40%, diff HK, mild to mod MR. small PFO. No LAA/RAA thrombus.  Marland Kitchen PAF  (paroxysmal atrial fibrillation) ([place])    a. diagnosed 01/13/2018; b. CHADS2VASc = 6 --> Eliquis; c. 12/2017 s/p TEE/DCCV. Amio started but d/c'd 01/2018 2/2 n/anorexia.  . Personal history of radiation therapy   . Psoriasis   . PSVT (paroxysmal supraventricular tachycardia) ([place])    a. 02/2015 Holter: short runs of SVT and NSVT.  Marland Kitchen Pulmonary hypertension ([place])   . PVC's (premature ventricular contractions)    a. 03/2018 24h Holter: Freq PVC's with a total of 421 beats in 24 hrs. 7 short runs of SVT likely representing Afib.  . Rheumatoid arthritis (HCC)    feet, hands  . Vertigo    approx 2x/yr     PAST SURGICAL HISTORY:   Past Surgical History:  Procedure Laterality Date  . ABDOMINAL HYSTERECTOMY    . back fusion    . BREAST BIOPSY Right    1991 negative  . BREAST EXCISIONAL BIOPSY Left    2009 positive  . BREAST LUMPECTOMY    . [place] N/A 01/15/2018   Procedure: CARDIOVERSION;  Surgeon: Wellington Hampshire, MD;  Location: [facility];  Service: Cardiovascular;  Laterality: N/A;  . CHOLECYSTECTOMY    . CORONARY ARTERY BYPASS GRAFT  1994   1 vessel - [facility]  . FOOT  ARTHRODESIS Right 07/24/2016   Procedure: FUSION FIRST METATARSAL CUNEIFORM JOINT RIGHT FOOT, FUSION SECOND METATARSAL CUNEFORM JOINT BUNION REPAIR RIGHT FOOT;  Surgeon: Albertine Patricia, DPM;  Location: [facility];  Service: Podiatry;  Laterality: Right;  . FOOT FRACTURE SURGERY    . HARDWARE REMOVAL Right 07/24/2016   Procedure: REMOVAL HARDWARE LATERAL MALLEOUS RIGHT ANKLE;  Surgeon: Albertine Patricia, DPM;  Location: [facility];  Service: Podiatry;  Laterality: Right;  REMOVAL OF PIN WHICH WAS INTACT  . HERNIA REPAIR    . NECK SURGERY    . RIGHT/LEFT HEART CATH AND CORONARY/GRAFT ANGIOGRAPHY N/A 01/14/2018   Procedure: LEFT HEART CATH AND CORONARY ANGIOGRAPHY;  Surgeon: Wellington Hampshire, MD;  Location: [facility];  Service:  Cardiovascular;  Laterality: N/A;  . TEE WITHOUT CARDIOVERSION N/A 01/15/2018   Procedure: TRANSESOPHAGEAL ECHOCARDIOGRAM (TEE);  Surgeon: Wellington Hampshire, MD;  Location: [facility];  Service: Cardiovascular;  Laterality: N/A;  . TOTAL HIP ARTHROPLASTY Left   . TOTAL KNEE ARTHROPLASTY Left      SOCIAL HISTORY:   Social History   Tobacco Use  . Smoking status: Former Smoker    Packs/day: 0.50    Years: 35.00    Pack years: 17.50    Types: Cigarettes  . Smokeless tobacco: Former Systems developer    Quit date: 12/22/1978  . Tobacco comment: Quit approx 1990  Substance Use Topics  . Alcohol use: No     FAMILY HISTORY:   Family History  Problem Relation Age of Onset  . Heart attack Father   . Congestive Heart Failure Mother   . Breast cancer Mother 71  . Kidney failure Sister   . Prostate cancer Brother   . Bladder Cancer Brother   . Lung cancer Sister   . Arthritis Sister   . Breast cancer Sister   . Arthritis Sister   . Breast cancer Sister   . Lung cancer Sister   . Lung cancer Brother   . Arthritis Brother      DRUG ALLERGIES:   Allergies  Allergen Reactions  . Sulfa Antibiotics Hives  . Ivp Dye [Iodinated Diagnostic Agents] Swelling  . Amiodarone Nausea And Vomiting and Cough  . Codeine Other (See Comments)    ALTERED MENTAL STATUS    MEDICATIONS AT HOME:   Prior to Admission medications   Medication Sig Start Date End Date Taking? Authorizing Provider  acetaminophen (TYLENOL) 325 MG tablet Take 975 mg by mouth every 8 (eight) hours. Three tablets to = 975 mg    [provider]  apixaban (ELIQUIS) 5 MG TABS tablet Take 1 tablet (5 mg total) by mouth 2 (two) times daily. 03/05/18   Minna Merritts, MD  Apremilast (OTEZLA) 30 MG TABS Take 30 mg by mouth 2 (two) times daily.    [provider]  budesonide-formoterol (SYMBICORT) 160-4.5 MCG/ACT inhaler Inhale 2 puffs into the lungs 2 (two) times daily. 10/06/16   Jerrol Banana., MD   fluticasone [facility]) 50 MCG/ACT nasal spray Place 2 sprays into both nostrils daily.    [provider]  gabapentin (NEURONTIN) 300 MG capsule Take 600 mg by mouth 3 (three) times daily. Two capsules to = 600 mg    [provider]  meclizine (ANTIVERT) 25 MG tablet Take 25 mg by mouth 3 (three) times daily as needed for dizziness.  04/03/15   [provider]  methocarbamol (ROBAXIN) 750 MG tablet Take 750 mg by mouth 3 (three) times daily.  [provider]  metoprolol tartrate (LOPRESSOR) 100 MG tablet Take 100 mg by mouth 2 (two) times daily.    [provider]  montelukast (SINGULAIR) 10 MG tablet Take 10 mg by mouth daily as needed (for allergies).  04/02/15   [provider]  Multiple Vitamins-Minerals (MULTIVITAMIN PO) Take 1 tablet by mouth daily.    [provider]  oxycodone (OXY-IR) 5 MG capsule Take 1 capsule (5 mg total) by mouth every 4 (four) hours as needed for pain (Take 1/2 to 1 tablet). 10/08/18   Medina-Vargas, Monina C, NP  polyethylene glycol (MIRALAX / GLYCOLAX) packet Take 17 g by mouth daily.    [provider]  rosuvastatin (CRESTOR) 5 MG tablet TAKE 1 TABLET BY MOUTH EVERY DAY 09/23/18   Wellington Hampshire, MD  sennosides-docusate sodium (SENOKOT-S) 8.6-50 MG tablet Take 2 tablets by mouth 2 (two) times daily.    [provider]  venlafaxine (EFFEXOR) 75 MG tablet Take 2 tablets (150 mg total) by mouth daily. 07/09/18   Jerrol Banana., MD    REVIEW OF SYSTEMS:  Review of Systems  Constitutional: Negative for chills, fever, malaise/fatigue and weight loss.  HENT: Negative for ear pain, hearing loss and tinnitus.   Eyes: Negative for blurred vision, double vision, pain and redness.  Respiratory: Positive for shortness of breath. Negative for cough and hemoptysis.   Cardiovascular: Positive for palpitations. Negative for chest pain, orthopnea and leg swelling.  Gastrointestinal:  Negative for abdominal pain, constipation, diarrhea, nausea and vomiting.  Genitourinary: Negative for dysuria, frequency and hematuria.  Musculoskeletal: Negative for back pain, joint pain and neck pain.  Skin:       No acne, rash, or lesions  Neurological: Negative for dizziness, tremors, focal weakness and weakness.  Endo/Heme/Allergies: Negative for polydipsia. Does not bruise/bleed easily.  Psychiatric/Behavioral: Negative for depression. The patient is not nervous/anxious and does not have insomnia.      VITAL SIGNS:   Vitals:   10/10/18 2030 10/10/18 2100 10/10/18 2130 10/10/18 2200  BP: (!) 125/92 108/85 103/81 109/86  Pulse:      Resp: 16 (!) 21 (!) 21 17  Temp:      TempSrc:      SpO2:      Weight:      Height:       Wt Readings from Last 3 Encounters:  10/10/18 78.5 kg  10/08/18 78.5 kg  08/31/18 78.5 kg    PHYSICAL EXAMINATION:  Physical Exam  Vitals reviewed. Constitutional: She is oriented to person, place, and time. She appears well-developed and well-nourished. No distress.  HENT:  Head: Normocephalic and atraumatic.  Mouth/Throat: Oropharynx is clear and moist.  Eyes: Pupils are equal, round, and reactive to light. Conjunctivae and EOM are normal. No scleral icterus.  Neck: Normal range of motion. Neck supple. No JVD present. No thyromegaly present.  Cardiovascular: Intact distal pulses. Exam reveals no gallop and no friction rub.  No murmur heard. Tachycardic, irregular rhythm  Respiratory: Effort normal and breath sounds normal. No respiratory distress. She has no wheezes. She has no rales.  GI: Soft. Bowel sounds are normal. She exhibits no distension. There is no tenderness.  Musculoskeletal: Normal range of motion. She exhibits no edema.  No arthritis, no gout  Lymphadenopathy:    She has no cervical adenopathy.  Neurological: She is alert and oriented to person, place, and time. No cranial nerve deficit.  No dysarthria, no aphasia  Skin: Skin is  warm and  dry. No rash noted. No erythema.  Psychiatric: She has a normal mood and affect. Her behavior is normal. Judgment and thought content normal.    LABORATORY PANEL:   CBC Recent Labs  Lab 10/10/18 1925  WBC 8.7  HGB 12.1  HCT 38.6  PLT 268   ------------------------------------------------------------------------------------------------------------------  Chemistries  Recent Labs  Lab 10/10/18 1925  NA 137  K 4.1  CL 103  CO2 28  GLUCOSE 123*  BUN 15  CREATININE 0.62  CALCIUM 8.8*   ------------------------------------------------------------------------------------------------------------------  Cardiac Enzymes Recent Labs  Lab 10/10/18 1925  TROPONINI <0.03   ------------------------------------------------------------------------------------------------------------------  RADIOLOGY:  No results found.  EKG:   Orders placed or performed in visit on 08/31/18  . EKG 12-Lead    IMPRESSION AND PLAN:  Principal Problem:   Atrial fibrillation with rapid ventricular response (HCC) - given new higher dose metoprolol without control of RVR, started on cardizem gtt, cardiology consult placed Active Problems:   CAD (coronary artery disease) - continue home meds   Psoriatic arthritis ([place]) - home dose otezla   Hyperlipidemia - home dose statin   Acid reflux - home dose ppi  Chart review performed and case discussed with ED provider. Labs, imaging and/or ECG reviewed by provider and discussed with patient/family. Management plans discussed with the patient and/or family.  DVT PROPHYLAXIS: Systemic anticoagulation  GI PROPHYLAXIS:  PPI   ADMISSION STATUS: Observation  CODE STATUS: Full Code Status History    Date Active Date Inactive Code Status Order ID Comments User Context   01/13/2018 1451 01/15/2018 1839 Full Code [id number]  Bettey Costa, MD Inpatient   01/13/2018 1328 01/13/2018 1451 DNR [id number]  Bettey Costa, MD ED    Advance Directive  Documentation     Most Recent Value  Type of Advance Directive  Healthcare Power of [place], Living will  Pre-existing out of facility DNR order (yellow form or pink MOST form)  -  "MOST" Form in Place?  -      TOTAL TIME TAKING CARE OF THIS PATIENT: 40 minutes.   Jahnyla Parrillo [place] 10/10/2018, 10:09 PM  [facility]  Office  [phone]  CC: Primary care physician; Jerrol Banana., MD  Note:  This document was prepared using Dragon voice recognition software and may include unintentional dictation errors.

## 2018-10-10 NOTE — ED Triage Notes (Signed)
Pt arrived from home via EMS with complaints of "feeling heart racing". Pt had Neck surgery recently and they found her to have A-fib. Pt suspects that the rapid heart racing is due to recent change in her medication (Metoprolol) from 50mg  to 100mg . Pt has no complaints of pain or SOB. Pt is alert and oriented x 4. Daughter is at the bedside. Dr. Lucita Lora is at bedside.

## 2018-10-11 ENCOUNTER — Observation Stay: Payer: Medicare HMO

## 2018-10-11 ENCOUNTER — Other Ambulatory Visit: Payer: Self-pay

## 2018-10-11 DIAGNOSIS — I2510 Atherosclerotic heart disease of native coronary artery without angina pectoris: Secondary | ICD-10-CM | POA: Diagnosis not present

## 2018-10-11 DIAGNOSIS — I4891 Unspecified atrial fibrillation: Secondary | ICD-10-CM

## 2018-10-11 DIAGNOSIS — E785 Hyperlipidemia, unspecified: Secondary | ICD-10-CM | POA: Diagnosis not present

## 2018-10-11 DIAGNOSIS — M542 Cervicalgia: Secondary | ICD-10-CM | POA: Diagnosis not present

## 2018-10-11 DIAGNOSIS — L4050 Arthropathic psoriasis, unspecified: Secondary | ICD-10-CM | POA: Diagnosis not present

## 2018-10-11 LAB — TROPONIN I
Troponin I: <0.03 ng/mL (ref <0.03)
Troponin I: <0.03 ng/mL (ref <0.03)
Troponin I: <0.03 ng/mL (ref <0.03)

## 2018-10-11 LAB — CBC
HCT: 36.4 % (ref 36.0–46.0)
Hemoglobin: 11.4 g/dL — ABNORMAL LOW (ref 12.0–15.0)
MCH: 30.5 pg (ref 26.0–34.0)
MCHC: 31.3 g/dL (ref 30.0–36.0)
MCV: 97.3 fL (ref 80.0–100.0)
Platelets: 246 10*3/uL (ref 150–400)
RBC: 3.74 MIL/uL — ABNORMAL LOW (ref 3.87–5.11)
RDW: 13.5 % (ref 11.5–15.5)
WBC: 7.8 10*3/uL (ref 4.0–10.5)
nRBC: 0 % (ref 0.0–0.2)

## 2018-10-11 LAB — BASIC METABOLIC PANEL
Anion gap: 7 (ref 5–15)
BUN: 14 mg/dL (ref 8–23)
CO2: 29 mmol/L (ref 22–32)
Calcium: 8.8 mg/dL — ABNORMAL LOW (ref 8.9–10.3)
Chloride: 103 mmol/L (ref 98–111)
Creatinine, Ser: 0.71 mg/dL (ref 0.44–1.00)
GFR calc Af Amer: >60 mL/min (ref >60)
GFR calc non Af Amer: >60 mL/min (ref >60)
Glucose, Bld: 110 mg/dL — ABNORMAL HIGH (ref 70–99)
Potassium: 4.2 mmol/L (ref 3.5–5.1)
Sodium: 139 mmol/L (ref 135–145)

## 2018-10-11 LAB — CK: Total CK: 33 U/L — ABNORMAL LOW (ref 38–234)

## 2018-10-11 MED ORDER — METHOCARBAMOL 750 MG PO TABS
750.0000 mg | ORAL_TABLET | Freq: Three times a day (TID) | ORAL | Status: DC
  Administered 2018-10-11 – 2018-10-13 (×7): 750 mg via ORAL
  Filled 2018-10-11 (×9): qty 1

## 2018-10-11 MED ORDER — NON FORMULARY: 30.0000 mg | Freq: Two times a day (BID) | Status: DC

## 2018-10-11 MED ORDER — MENTHOL 3 MG MT LOZG
1.0000 | LOZENGE | OROMUCOSAL | Status: DC | PRN
  Administered 2018-10-11 – 2018-10-12 (×2): 3 mg via ORAL
  Filled 2018-10-11: qty 9

## 2018-10-11 NOTE — Progress Notes (Addendum)
Pt has a schedule metropolol but pt bp was at 96/45 HR 90. Notify prime. Will continue to monitor.  Update 2250; Doctor Jannifer Franklin called and states to hold metropolol at this time. Will continue to monitor.  Update 0428: CCMD called and reported that PT HR sustaining at 120 for 5 minutes. Notify prime and talked to doctor diamond. He place and order for metropolol 100 mg twice a day. Will continue to monitor.

## 2018-10-11 NOTE — Progress Notes (Signed)
Patient arrived to 2A Room 245. Patient c/o nexk pain 6/10. Patient oriented to unit and use of call bell/room phone, all questions answered. Skin assessment completed with Lexi RN and skin intact with patient wearing hard C-collar from recent spinal fusion. A&Ox4, VSS, and Afib on verified tele-box #[id number]. Cardizem gtt infusing at 7.5mg /hr. Son at bedside. Nursing staff will continue to monitor for any changes in patient status. Earleen Reaper, RN

## 2018-10-11 NOTE — Care Management Obs Status (Signed)
[place] NOTIFICATION   Patient Details  Name: Teresa [name] MRN: [id number] Date of Birth: [birth year]   Medicare Observation Status Notification Given:  Yes Patient unable to sign MOON letter but verbalizes understanding.  Copy provided to patient.   Elza Rafter, RN 10/11/2018, 4:30 PM

## 2018-10-11 NOTE — Progress Notes (Signed)
[facility] at [place] NAME: Teresa [name]    MR#:  [id number]  DATE OF BIRTH:  [birth year]  SUBJECTIVE:  CHIEF COMPLAINT:   Chief Complaint  Patient presents with  . Tachycardia  Patient seen and evaluated today Has a rapid heart rate No complaints of chest pain Has generalized body aches  REVIEW OF SYSTEMS:    ROS  CONSTITUTIONAL: No documented fever. No fatigue, weakness. No weight gain, no weight loss.  Generalized body aches EYES: No blurry or double vision.  ENT: No tinnitus. No postnasal drip. No redness of the oropharynx.  RESPIRATORY: No cough, no wheeze, no hemoptysis. No dyspnea.  CARDIOVASCULAR: No chest pain. No orthopnea. No palpitations. No syncope.  GASTROINTESTINAL: No nausea, no vomiting or diarrhea. No abdominal pain. No melena or hematochezia.  GENITOURINARY: No dysuria or hematuria.  ENDOCRINE: No polyuria or nocturia. No heat or cold intolerance.  HEMATOLOGY: No anemia. No bruising. No bleeding.  INTEGUMENTARY: No rashes. No lesions.  MUSCULOSKELETAL: No arthritis. No swelling. No gout.  NEUROLOGIC: No numbness, tingling, or ataxia. No seizure-type activity.  PSYCHIATRIC: No anxiety. No insomnia. No ADD.   DRUG ALLERGIES:   Allergies  Allergen Reactions  . Sulfa Antibiotics Hives  . Ivp Dye [Iodinated Diagnostic Agents] Swelling  . Amiodarone Nausea And Vomiting and Cough  . Codeine Other (See Comments)    ALTERED MENTAL STATUS    VITALS:  Blood pressure (!) 94/59, pulse 82, temperature 98 F (36.7 C), resp. rate 16, height 5\' 4"  (1.626 m), weight 82.8 kg, SpO2 94 %.  PHYSICAL EXAMINATION:   Physical Exam  GENERAL:  77 y.o.-year-old patient lying in the bed with no acute distress.  Has cervical collar EYES: Pupils equal, round, reactive to light and accommodation. No scleral icterus. Extraocular muscles intact.  HEENT: Head atraumatic, normocephalic. Oropharynx and nasopharynx clear.  NECK:  Supple,  no jugular venous distention. No thyroid enlargement, no tenderness.  LUNGS: Normal breath sounds bilaterally, no wheezing, rales, rhonchi. No use of accessory muscles of respiration.  CARDIOVASCULAR: S1, S2 irregular. No murmurs, rubs, or gallops.  ABDOMEN: Soft, nontender, nondistended. Bowel sounds present. No organomegaly or mass.  EXTREMITIES: No cyanosis, clubbing or edema b/l.    NEUROLOGIC: Cranial nerves II through XII are intact. No focal Motor or sensory deficits b/l.   PSYCHIATRIC: The patient is alert and oriented x 3.  SKIN: No obvious rash, lesion, or ulcer.   LABORATORY PANEL:   CBC Recent Labs  Lab 10/11/18 0446  WBC 7.8  HGB 11.4*  HCT 36.4  PLT 246   ------------------------------------------------------------------------------------------------------------------ Chemistries  Recent Labs  Lab 10/11/18 0446  NA 139  K 4.2  CL 103  CO2 29  GLUCOSE 110*  BUN 14  CREATININE 0.71  CALCIUM 8.8*   ------------------------------------------------------------------------------------------------------------------  Cardiac Enzymes Recent Labs  Lab 10/11/18 0920  TROPONINI <0.03   ------------------------------------------------------------------------------------------------------------------  RADIOLOGY:  No results found.   ASSESSMENT AND PLAN:   77 year old female patient with history of atrial fibrillation, cardioversion, breast cancer, coronary artery disease, GERD, hypertension, hyperlipidemia, acid reflux currently under hospitalist service for atrial fibrillation  -Atrial fibrillation with rapid ventricular rate Increased dose of metoprolol for rate control TEE and cardioversion have been deferred by cardiology Medical management for rate control Anticoagulation with oral Eliquis  -Chronic systolic heart failure If blood pressure allows we will add ACE inhibitor and ARB/Entresto and Aldactone Currently on Lopressor  -Coronary artery disease  status post CABG Recent nonobstructive left heart  cath in January 2019 No plans of ischemic cardiac work-up this time  -Occasional PVCs Monitor and replace electrolytes  -DVT prophylaxis On oral Eliquis for anticoagulation   All the records are reviewed and case discussed with Care Management/Social Worker. Management plans discussed with the patient, family and they are in agreement.  CODE STATUS: Full code  DVT Prophylaxis: SCDs  TOTAL TIME TAKING CARE OF THIS PATIENT: 35 minutes.   POSSIBLE D/C IN 1 to 2 DAYS, DEPENDING ON CLINICAL CONDITION.  Saundra Shelling M.D on 10/11/2018 at 3:13 PM  Between 7am to 6pm - Pager - [phone]  After 6pm go to [email or web address] - password [email or web address] [facility]  Office  [phone]  CC: Primary care physician; Jerrol Banana., MD  Note: This dictation was prepared with Dragon dictation along with smaller phrase technology. Any transcriptional errors that result from this process are unintentional.

## 2018-10-11 NOTE — Consult Note (Signed)
Cardiology Consultation:   Patient ID: Teresa [name]; [id number]; [birth year]   Admit date: 10/10/2018 Date of Consult: 10/11/2018  Primary Care Provider: Jerrol Banana., MD Primary Cardiologist: Fletcher Anon   Patient Profile:   Teresa [name] is a 77 y.o. 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 Jannifer Franklin, MD.   History of Present Illness:   Ms. Wiltsey underwent 1-vessel CABG in 1994 with a LIMA to LAD. In the late 2018, she experienced atypical chest discomfort and underwent stress testing which showed a fixed apical defect felt to be secondary to breast attenuation. EF was 42%. This was followed by an echocardiogram which confirmed LV dysfunction with EF of 35 to 40%. With her prior history of cardiomyopathy and frequent PVCs, she was seen with a plan to set her up for catheterization and Holter monitoring however when she presented, she was in rapid atrial fibrillation. She was subsequently admitted and placed on amiodarone. She underwent catheterization, in 12/2017, which revealed an atretic LIMA to the LAD, and otherwise nonobstructive disease. She then underwent TEE and cardioversion, which was successful. Though she was discharged on amiodarone, this was subsequently discontinued secondary to nausea and anorexia. She wore a Holter monitor in April, 2019, which showed frequent PVCs and 7 brief runs of atrial fibrillation. Metoprolol was titrated to 50 mg daily. Her sinus rate tends to be bradycardic. She was last seen in the office in 04/2018 and was doing well. She recently underwent neck surgery at [facility] on 09/23/2018. Following this procedure, on 09/25/2018, she went into Afib with RVR with ventricular rates into the 120s to  140s bpm. Cardiology was consulted and recommended increased dose of metoprolol to 100 mg bid. TSH was normal. Echo on 09/27/2018 showed an EF of 35%, moderate LV and mild RV dysfunction. Eliquis was resumed on 09/26/2018.   Since her surgery, she has "felt awful." She has noted severe arthralgias from the hips radiating to her neck. She denies any fever or chills. In this setting, she has noted ongoing palpitations since her discharge from [facility]. Some chest pain with deep inspiration.   Upon the patient's arrival to [facility] they were found to be in Afib with RVR with ventricular rates into the 140s bpm. BP stable. EKG showed Afib with RVR, 144 bpm, LBBB (old), CXR not done. Labs showed troponin negative x 1, WBC 8.7, HGB 12.1, PLT 268, SCr 0.62, K+ 4.1. She has been given PO Lopressor as well as started on a diltiazem gtt. She remains in Afib this morning with ventricular rates into the 70s to 90s bpm and continues to note severe arthralgias.   Past Medical History:  Diagnosis Date  . Arthritis   . Asthma   . Back pain   . Breast cancer ([place]) 2009   left breast   . CAD (coronary artery disease)    a. 1994 s/p CABG x 1 (LIMA->LAD); b. 03/2015 MV: No ischemia; c. MV 11/18: small fixed apical defect likely secondary to breast attenuation, EF of 42%, frequent PVCs; d. 12/2017 Cath: LM nl, LAD 20p, D1/2/3 nl, LCX min irregs, OM1/2/3 min irregs, RCA nl, RPDA nl, RPL1/2 nl, LIMA->LAD atretic.  Marland Kitchen Dental crowns present    caps- left back  top, right back bottom  . Gastroesophageal reflux disease   . Hypertension   . MI (myocardial infarction) ([place]) 1994  . Mixed Ischemic & Nonischemic cardiomyopathy    a. 2013 EF 40%;  b. 03/2015 Echo: EF 55-60%, mild MR, mod dil LA, nl RV fxn; c. 12/18 Echo: EF of 35-40%, hypokinesis of the anteroseptal, and apical myocardium, mild to mod MR, mod dil LA, nl RV fxn, PASP 53 mmHg; d. 12/2017 TEE: EF 35-40%, diff HK, mild to mod MR. small PFO. No LAA/RAA thrombus.  Marland Kitchen PAF  (paroxysmal atrial fibrillation) ([place])    a. diagnosed 01/13/2018; b. CHADS2VASc = 6 --> Eliquis; c. 12/2017 s/p TEE/DCCV. Amio started but d/c'd 01/2018 2/2 n/anorexia.  . Personal history of radiation therapy   . Psoriasis   . PSVT (paroxysmal supraventricular tachycardia) ([place])    a. 02/2015 Holter: short runs of SVT and NSVT.  Marland Kitchen Pulmonary hypertension ([place])   . PVC's (premature ventricular contractions)    a. 03/2018 24h Holter: Freq PVC's with a total of 421 beats in 24 hrs. 7 short runs of SVT likely representing Afib.  . Rheumatoid arthritis (HCC)    feet, hands  . Vertigo    approx 2x/yr    Past Surgical History:  Procedure Laterality Date  . ABDOMINAL HYSTERECTOMY    . back fusion    . BREAST BIOPSY Right    1991 negative  . BREAST EXCISIONAL BIOPSY Left    2009 positive  . BREAST LUMPECTOMY    . [place] N/A 01/15/2018   Procedure: CARDIOVERSION;  Surgeon: Wellington Hampshire, MD;  Location: [facility] ORS;  Service: Cardiovascular;  Laterality: N/A;  . CHOLECYSTECTOMY    . CORONARY ARTERY BYPASS GRAFT  1994   1 vessel - [facility]  . FOOT ARTHRODESIS Right 07/24/2016   Procedure: FUSION FIRST METATARSAL CUNEIFORM JOINT RIGHT FOOT, FUSION SECOND METATARSAL CUNEFORM JOINT BUNION REPAIR RIGHT FOOT;  Surgeon: Albertine Patricia, DPM;  Location: [place];  Service: Podiatry;  Laterality: Right;  . FOOT FRACTURE SURGERY    . HARDWARE REMOVAL Right 07/24/2016   Procedure: REMOVAL HARDWARE LATERAL MALLEOUS RIGHT ANKLE;  Surgeon: Albertine Patricia, DPM;  Location: [place];  Service: Podiatry;  Laterality: Right;  REMOVAL OF PIN WHICH WAS INTACT  . HERNIA REPAIR    . NECK SURGERY    . RIGHT/LEFT HEART CATH AND CORONARY/GRAFT ANGIOGRAPHY N/A 01/14/2018   Procedure: LEFT HEART CATH AND CORONARY ANGIOGRAPHY;  Surgeon: Wellington Hampshire, MD;  Location: [place] [facility];  Service: Cardiovascular;  Laterality:  N/A;  . TEE WITHOUT CARDIOVERSION N/A 01/15/2018   Procedure: TRANSESOPHAGEAL ECHOCARDIOGRAM (TEE);  Surgeon: Wellington Hampshire, MD;  Location: [facility] ORS;  Service: Cardiovascular;  Laterality: N/A;  . TOTAL HIP ARTHROPLASTY Left   . TOTAL KNEE ARTHROPLASTY Left      Home Meds: Prior to Admission medications   Medication Sig Start Date End Date Taking? Authorizing Provider  acetaminophen (TYLENOL) 325 MG tablet Take 975 mg by mouth every 8 (eight) hours. Three tablets to = 975 mg   Yes [provider]  apixaban (ELIQUIS) 5 MG TABS tablet Take 1 tablet (5 mg total) by mouth 2 (two) times daily. 03/05/18  Yes Gollan, Kathlene November, MD  Apremilast (OTEZLA) 30 MG TABS Take 30 mg by mouth 2 (two) times daily.   Yes [provider]  budesonide-formoterol (SYMBICORT) 160-4.5 MCG/ACT inhaler  Inhale 2 puffs into the lungs 2 (two) times daily. 10/06/16  Yes Jerrol Banana., MD  fluticasone [facility]) 50 MCG/ACT nasal spray Place 2 sprays into both nostrils daily as needed for allergies.    Yes [provider]  gabapentin (NEURONTIN) 300 MG capsule Take 600 mg by mouth 3 (three) times daily. Two capsules to = 600 mg   Yes [provider]  meclizine (ANTIVERT) 25 MG tablet Take 25 mg by mouth 3 (three) times daily as needed for dizziness.  04/03/15  Yes [provider]  methocarbamol (ROBAXIN) 750 MG tablet Take 750 mg by mouth 3 (three) times daily.   Yes [provider]  metoprolol tartrate (LOPRESSOR) 100 MG tablet Take 100 mg by mouth 2 (two) times daily.   Yes [provider]  montelukast (SINGULAIR) 10 MG tablet Take 10 mg by mouth daily as needed (for allergies).  04/02/15  Yes [provider]  Multiple Vitamins-Minerals (MULTIVITAMIN PO) Take 1 tablet by mouth daily.   Yes [provider]  oxycodone (OXY-IR) 5 MG capsule Take 1 capsule (5 mg total) by mouth every 4 (four) hours as needed for pain (Take 1/2 to 1 tablet).  10/08/18  Yes Medina-Vargas, Monina C, NP  rosuvastatin (CRESTOR) 5 MG tablet TAKE 1 TABLET BY MOUTH EVERY DAY 09/23/18  Yes Wellington Hampshire, MD  sennosides-docusate sodium (SENOKOT-S) 8.6-50 MG tablet Take 2 tablets by mouth 2 (two) times daily.   Yes [provider]  venlafaxine (EFFEXOR) 75 MG tablet Take 2 tablets (150 mg total) by mouth daily. 07/09/18  Yes Jerrol Banana., MD    Inpatient Medications: Scheduled Meds: . apixaban  5 mg Oral BID  . Apremilast  30 mg Oral BID  . gabapentin  600 mg Oral TID  . metoprolol tartrate  100 mg Oral BID  . mometasone-formoterol  2 puff Inhalation BID  . rosuvastatin  5 mg Oral Daily  . senna-docusate  2 tablet Oral BID  . venlafaxine XR  150 mg Oral Daily   Continuous Infusions: . diltiazem (CARDIZEM) infusion 7.5 mg/hr (10/11/18 0628)   PRN Meds: acetaminophen **OR** acetaminophen, montelukast, ondansetron **OR** ondansetron (ZOFRAN) IV, oxyCODONE  Allergies:   Allergies  Allergen Reactions  . Sulfa Antibiotics Hives  . Ivp Dye [Iodinated Diagnostic Agents] Swelling  . Amiodarone Nausea And Vomiting and Cough  . Codeine Other (See Comments)    ALTERED MENTAL STATUS    Social History:   Social History   Socioeconomic History  . Marital status: Divorced    Spouse name: Not on file  . Number of children: Not on file  . Years of education: Not on file  . Highest education level: Not on file  Occupational History  . Not on file  Social Needs  . Financial resource strain: Not on file  . Food insecurity:    Worry: Not on file    Inability: Not on file  . Transportation needs:    Medical: Not on file    Non-medical: Not on file  Tobacco Use  . Smoking status: Former Smoker    Packs/day: 0.50    Years: 35.00    Pack years: 17.50    Types: Cigarettes  . Smokeless tobacco: Former Systems developer    Quit date: 12/22/1978  . Tobacco comment: Quit approx 1990  Substance and Sexual Activity  . Alcohol use: No  . Drug  use: No  . Sexual activity: Not on file  Lifestyle  . Physical  activity:    Days per week: Not on file    Minutes per session: Not on file  . Stress: Not on file  Relationships  . Social connections:    Talks on phone: Not on file    Gets together: Not on file    Attends religious service: Not on file    Active member of club or organization: Not on file    Attends meetings of clubs or organizations: Not on file    Relationship status: Not on file  . Intimate partner violence:    Fear of current or ex partner: Not on file    Emotionally abused: Not on file    Physically abused: Not on file    Forced sexual activity: Not on file  Other Topics Concern  . Not on file  Social History Narrative  . Not on file     Family History:   Family History  Problem Relation Age of Onset  . Heart attack Father   . Congestive Heart Failure Mother   . Breast cancer Mother 15  . Kidney failure Sister   . Prostate cancer Brother   . Bladder Cancer Brother   . Lung cancer Sister   . Arthritis Sister   . Breast cancer Sister   . Arthritis Sister   . Breast cancer Sister   . Lung cancer Sister   . Lung cancer Brother   . Arthritis Brother     ROS:  Review of Systems  Constitutional: Positive for malaise/fatigue. Negative for chills, diaphoresis, fever and weight loss.  HENT: Negative for congestion.   Eyes: Negative for discharge and redness.  Respiratory: Negative for cough, hemoptysis, sputum production, shortness of breath and wheezing.   Cardiovascular: Positive for chest pain and palpitations. Negative for orthopnea, claudication, leg swelling and PND.       Chest tightness with deep inspiration   Gastrointestinal: Negative for abdominal pain, blood in stool, heartburn, melena, nausea and vomiting.  Genitourinary: Negative for hematuria.  Musculoskeletal: Positive for joint pain, myalgias and neck pain. Negative for falls.  Skin: Negative for rash.  Neurological: Positive for  weakness. Negative for dizziness, tingling, tremors, sensory change, speech change, focal weakness and loss of consciousness.  Endo/Heme/Allergies: Does not bruise/bleed easily.  Psychiatric/Behavioral: Negative for substance abuse. The patient is not nervous/anxious.   All other systems reviewed and are negative.     Physical Exam/Data:   Vitals:   10/11/18 0230 10/11/18 0300 10/11/18 0330 10/11/18 0400  BP: 112/63 120/60 121/61 (!) 117/56  Pulse: 73 74 62 64  Resp:      Temp:      TempSrc:      SpO2:      Weight:      Height:        Intake/Output Summary (Last 24 hours) at 10/11/2018 0800 Last data filed at 10/11/2018 9675 Gross per 24 hour  Intake 64.95 ml  Output -  Net 64.95 ml   Filed Weights   10/10/18 1925 10/10/18 1926 10/10/18 2311  Weight: 78.5 kg 78.5 kg 82.8 kg   Body mass index is 31.34 kg/m.   Physical Exam: General: Well developed, well nourished, in no acute distress. Head: Normocephalic, atraumatic, sclera non-icteric, no xanthomas, nares without discharge.  Neck: Negative for carotid bruits. JVD not elevated. Lungs: Clear bilaterally to auscultation without wheezes, rales, or rhonchi. Breathing is unlabored. Heart: Irregularly irregular with S1 S2. No murmurs, rubs, or gallops appreciated. Abdomen: Soft, non-tender, non-distended with normoactive bowel sounds.  No hepatomegaly. No rebound/guarding. No obvious abdominal masses. Msk:  Strength and tone appear normal for age. Extremities: No clubbing or cyanosis. No edema. Distal pedal pulses are 2+ and equal bilaterally. Neuro: Alert and oriented X 3. No facial asymmetry. No focal deficit. Moves all extremities spontaneously. Psych:  Responds to questions appropriately with a normal affect.   EKG:  The EKG was personally reviewed and demonstrates: Afib with RVR, 144 bpm, LBBB (old) Telemetry:  Telemetry was personally reviewed and demonstrates: Afib 70s to 90s currently with prior runs of Afib into the  140s bpm, BBB  Weights: Filed Weights   10/10/18 1925 10/10/18 1926 10/10/18 2311  Weight: 78.5 kg 78.5 kg 82.8 kg    Relevant CV Studies: [facility] 01/14/2018: Conclusion     Prox LAD lesion is 20% stenosed.   1.  Mild nonobstructive coronary artery disease.  There is 20-30% stenosis in the LAD.  Atretic LIMA to LAD 2.  Mildly elevated left ventricular end-diastolic pressure.  Recommendations: The patient has no obstructive coronary artery disease.  Continue medical therapy.  If the patient remains in A. fib with RVR, I recommend proceeding with TEE guided cardioversion tomorrow. Resume heparin drip at 6 PM today if no bleeding complications.  __________  TEE/DCCV 01/15/2018: Study Conclusions  - Left ventricle: The cavity size was normal. Wall thickness was   normal. Systolic function was moderately reduced. The estimated   ejection fraction was in the range of 35% to 40%. Diffuse   hypokinesis. No evidence of thrombus. - Mitral valve: There was mild to moderate regurgitation. - Left atrium: The atrium was dilated. No evidence of thrombus in   the atrial cavity or appendage. - Right atrium: The atrium was dilated. No evidence of thrombus in   the atrial cavity or appendage. - Atrial septum: There was a small stretched patent foramen ovale.   Echo contrast study showed a small bidirectional atrial level   shunt, at baseline or with provocation.  Impressions:  - Successful cardioversion. No cardiac source of emboli was   indentified. __________  TTE 04/06/2018: Study Conclusions  - Left ventricle: The cavity size was normal. Wall thickness was   increased in a pattern of mild LVH. Systolic function was mildly   to moderately reduced. The estimated ejection fraction was in the   range of 40% to 45%. There is hypokinesis of the anteroseptal and   inferoseptal myocardium. Features are consistent with a   pseudonormal left ventricular filling pattern, with  concomitant   abnormal relaxation and increased filling pressure (grade 2   diastolic dysfunction). - Mitral valve: There was mild regurgitation. - Left atrium: The atrium was moderately dilated. - Right ventricle: The cavity size was normal. Systolic function   was normal. - Atrial septum: A septal defect cannot be excluded. - Tricuspid valve: There was mild-moderate regurgitation. - Pulmonary arteries: Systolic pressure was mildly increased, in   the range of 35 mm Hg to 40 mm Hg. __________  Holter 04/06/2018: Study Highlights   Normal sinus rhythm. Frequent PVCs with a total of 421 beats in 24 hours. 7 short runs of SVT that likely represent atrial fibrillation.    Laboratory Data:  Chemistry Recent Labs  Lab 10/10/18 1925 10/11/18 0446  NA 137 139  K 4.1 4.2  CL 103 103  CO2 28 29  GLUCOSE 123* 110*  BUN 15 14  CREATININE 0.62 0.71  CALCIUM 8.8* 8.8*  GFRNONAA >60 >60  GFRAA >60 >60  ANIONGAP 6 7  No results for input(s): PROT, ALBUMIN, AST, ALT, ALKPHOS, BILITOT in the last 168 hours. Hematology Recent Labs  Lab 10/10/18 1925 10/11/18 0446  WBC 8.7 7.8  RBC 3.97 3.74*  HGB 12.1 11.4*  HCT 38.6 36.4  MCV 97.2 97.3  MCH 30.5 30.5  MCHC 31.3 31.3  RDW 13.4 13.5  PLT 268 246   Cardiac Enzymes Recent Labs  Lab 10/10/18 1925  TROPONINI <0.03   No results for input(s): TROPIPOC in the last 168 hours.  BNPNo results for input(s): BNP, PROBNP in the last 168 hours.  DDimer No results for input(s): DDIMER in the last 168 hours.  Radiology/Studies:  No results found.  Assessment and Plan:   1. PAF with RVR: -It appears she has remained in Afib since 09/25/2018 -Ventricular rates are improved into the 70s to 90s bpm currently -Likely exacerbated by her severe arthralgias  -Stop diltiazem gtt given soft BP and well controlled ventricular rates -Continue Lopressor 100 mg bid for rate control -At baseline, her sinus rate is bradycardic. We will need  to be cautious with escalation of rate-controlling therapy given this -Continue Eliquis 5 mg bid (she does not meet reduced dosing criteria). It appears she was restarted on Eliquis on 09/26/18 and she does not think she has missed any doses, though is not 295% certain -I do not think a TEE/DCCV would be that successful at this time given her diffuse, severe pain -Her pain will need to be adequately treated/controlled prior to moving forward with rhythm control -Continue rate control strategy for now with plans for outpatient DCCV once her pain is better controlled -Should her ventricular rates prove to be difficult to control secondary to her pain or with ambulation, we will need to undergo TEE/DCCV prior to discharge  -She has previously not tolerated amiodarone secondary to nausea -Ultimately, she may require Tikosyn, though this can be established when she is back to her baseline -CHADS2VASc at least 6 (CHF, HTN, age x 2, vascular disease, female)  2. Chronic systolic CHF secondary to mixed ICM/NICM: -She does not appear grossly volume up at this time -Recent echo as above, no need to repeat at this time -Lopressor as above, as her ventricular rates improve, consider transition to Toprol XL or Coreg given her cardiomyopathy -Look to add ACEi/ARB/Entresto/spironolactone as BP allows  3. CAD s/p CABG: -Notes atypical, pleuritic chest pain -Troponin negative x 1, cycle to rule out -Recent echo at [facility] as above -Recent nonobstructive LHC 12/2017 -No plans for inpatient ischemic evaluation at this time  4. PVCs: -Occasional PVCs noted on telemetry -Potassium at goal -Check magnesium with recommendation to replete to goal 2.0 -Recent TSH normal at [facility] earlier this month  5. HTN: -BP on the soft side on diltiazem gtt -Diltiazem gtt stopped as above -Continue Lopressor 100 mg bid  6. HLD: -Hold Crestor as below to evaluate arthralgias  -Goal LDL < 70 -Most recent LDL of 80 from  06/2017  7. Arthralgias: -Hold Crestor -Check CK -Per IM   For questions or updates, please contact [place] Please consult [email or web address] for contact info under Cardiology/STEMI.   Signed, Christell Faith, PA-C [place] Pager: [phone] 10/11/2018, 8:00 AM

## 2018-10-11 NOTE — Progress Notes (Signed)
Pharmacy doesn't carry patient's Apremilast. Notified MD Jannifer Franklin and per MD, okay for family to bring patient's supply from home to be dispensed here. Patient's son agreed to bring the medication in the morning. Earleen Reaper, RN

## 2018-10-12 DIAGNOSIS — R42 Dizziness and giddiness: Secondary | ICD-10-CM | POA: Diagnosis present

## 2018-10-12 DIAGNOSIS — I959 Hypotension, unspecified: Secondary | ICD-10-CM | POA: Diagnosis not present

## 2018-10-12 DIAGNOSIS — Z951 Presence of aortocoronary bypass graft: Secondary | ICD-10-CM | POA: Diagnosis not present

## 2018-10-12 DIAGNOSIS — I255 Ischemic cardiomyopathy: Secondary | ICD-10-CM | POA: Diagnosis present

## 2018-10-12 DIAGNOSIS — L4050 Arthropathic psoriasis, unspecified: Secondary | ICD-10-CM | POA: Diagnosis present

## 2018-10-12 DIAGNOSIS — E785 Hyperlipidemia, unspecified: Secondary | ICD-10-CM | POA: Diagnosis present

## 2018-10-12 DIAGNOSIS — I428 Other cardiomyopathies: Secondary | ICD-10-CM | POA: Diagnosis present

## 2018-10-12 DIAGNOSIS — Z96642 Presence of left artificial hip joint: Secondary | ICD-10-CM | POA: Diagnosis present

## 2018-10-12 DIAGNOSIS — J45909 Unspecified asthma, uncomplicated: Secondary | ICD-10-CM | POA: Diagnosis present

## 2018-10-12 DIAGNOSIS — I2510 Atherosclerotic heart disease of native coronary artery without angina pectoris: Secondary | ICD-10-CM | POA: Diagnosis present

## 2018-10-12 DIAGNOSIS — M542 Cervicalgia: Secondary | ICD-10-CM | POA: Diagnosis present

## 2018-10-12 DIAGNOSIS — I4819 Other persistent atrial fibrillation: Secondary | ICD-10-CM | POA: Diagnosis present

## 2018-10-12 DIAGNOSIS — I493 Ventricular premature depolarization: Secondary | ICD-10-CM | POA: Diagnosis present

## 2018-10-12 DIAGNOSIS — I5022 Chronic systolic (congestive) heart failure: Secondary | ICD-10-CM

## 2018-10-12 DIAGNOSIS — I272 Pulmonary hypertension, unspecified: Secondary | ICD-10-CM | POA: Diagnosis present

## 2018-10-12 DIAGNOSIS — I340 Nonrheumatic mitral (valve) insufficiency: Secondary | ICD-10-CM | POA: Diagnosis present

## 2018-10-12 DIAGNOSIS — I4891 Unspecified atrial fibrillation: Secondary | ICD-10-CM | POA: Diagnosis present

## 2018-10-12 DIAGNOSIS — G8929 Other chronic pain: Secondary | ICD-10-CM | POA: Diagnosis present

## 2018-10-12 DIAGNOSIS — M1990 Unspecified osteoarthritis, unspecified site: Secondary | ICD-10-CM | POA: Diagnosis present

## 2018-10-12 DIAGNOSIS — I447 Left bundle-branch block, unspecified: Secondary | ICD-10-CM | POA: Diagnosis present

## 2018-10-12 DIAGNOSIS — M2550 Pain in unspecified joint: Secondary | ICD-10-CM | POA: Diagnosis present

## 2018-10-12 DIAGNOSIS — Q211 Atrial septal defect: Secondary | ICD-10-CM | POA: Diagnosis not present

## 2018-10-12 DIAGNOSIS — M069 Rheumatoid arthritis, unspecified: Secondary | ICD-10-CM | POA: Diagnosis present

## 2018-10-12 DIAGNOSIS — I110 Hypertensive heart disease with heart failure: Secondary | ICD-10-CM | POA: Diagnosis present

## 2018-10-12 DIAGNOSIS — K219 Gastro-esophageal reflux disease without esophagitis: Secondary | ICD-10-CM | POA: Diagnosis present

## 2018-10-12 LAB — MAGNESIUM: Magnesium: 1.9 mg/dL (ref 1.7–2.4)

## 2018-10-12 MED ORDER — METOPROLOL SUCCINATE ER 100 MG PO TB24
100.0000 mg | ORAL_TABLET | Freq: Two times a day (BID) | ORAL | Status: DC
  Administered 2018-10-12: 100 mg via ORAL
  Filled 2018-10-12 (×2): qty 1

## 2018-10-12 MED ORDER — METOPROLOL TARTRATE 50 MG PO TABS
100.0000 mg | ORAL_TABLET | Freq: Two times a day (BID) | ORAL | Status: DC
  Administered 2018-10-12: 100 mg via ORAL
  Filled 2018-10-12: qty 2

## 2018-10-12 MED ORDER — OXYCODONE HCL ER 10 MG PO T12A
10.0000 mg | EXTENDED_RELEASE_TABLET | Freq: Two times a day (BID) | ORAL | Status: DC
  Administered 2018-10-12 – 2018-10-13 (×3): 10 mg via ORAL
  Filled 2018-10-12 (×3): qty 1

## 2018-10-12 MED ORDER — OXYCODONE HCL ER 15 MG PO T12A: 15.0000 mg | EXTENDED_RELEASE_TABLET | Freq: Two times a day (BID) | ORAL | Status: DC

## 2018-10-12 MED ORDER — DILTIAZEM HCL 30 MG PO TABS
30.0000 mg | ORAL_TABLET | Freq: Three times a day (TID) | ORAL | Status: DC
  Administered 2018-10-12 – 2018-10-13 (×2): 30 mg via ORAL
  Filled 2018-10-12 (×4): qty 1

## 2018-10-12 NOTE — Progress Notes (Signed)
Progress Note  Patient Name: Teresa [name] Date of Encounter: 10/12/2018  Primary Cardiologist: Fletcher Anon  Subjective   Diffuse pain is worse this morning. Remains in Afib with ventricular rates in the 80s to 120s bpm.   Inpatient Medications    Scheduled Meds: . apixaban  5 mg Oral BID  . Apremilast  30 mg Oral BID  . gabapentin  600 mg Oral TID  . methocarbamol  750 mg Oral TID  . metoprolol tartrate  100 mg Oral BID  . mometasone-formoterol  2 puff Inhalation BID  . senna-docusate  2 tablet Oral BID  . venlafaxine XR  150 mg Oral Daily   Continuous Infusions:  PRN Meds: acetaminophen **OR** acetaminophen, menthol-cetylpyridinium, montelukast, ondansetron **OR** ondansetron (ZOFRAN) IV, oxyCODONE   Vital Signs    Vitals:   10/12/18 0406 10/12/18 0424 10/12/18 0555 10/12/18 0739  BP: 124/78 124/77 116/70 (!) 111/59  Pulse: 95 (!) 116 80 84  Resp: 18     Temp: 98.2 F (36.8 C) (!) 97.5 F (36.4 C)    TempSrc:  Oral    SpO2: 95% 99%  92%  Weight:  69.7 kg    Height:        Intake/Output Summary (Last 24 hours) at 10/12/2018 0842 Last data filed at 10/12/2018 0100 Gross per 24 hour  Intake 840 ml  Output 0 ml  Net 840 ml   Filed Weights   10/10/18 1926 10/10/18 2311 10/12/18 0424  Weight: 78.5 kg 82.8 kg 69.7 kg    Telemetry    Afib with ventricular rates in the 80s to 120s bpm - Personally Reviewed  ECG    n/a - Personally Reviewed  Physical Exam   GEN: No acute distress.   Neck: JVD difficult to assess secondary to neck brace. Cardiac: Tachycardic, irregularly irregular, no murmurs, rubs, or gallops.  Respiratory: Clear to auscultation bilaterally.  GI: Soft, nontender, non-distended.   MS: No edema; No deformity. Neuro:  Alert and oriented x 3; Nonfocal.  Psych: Normal affect.  Labs    Chemistry Recent Labs  Lab 10/10/18 1925 10/11/18 0446  NA 137 139  K 4.1 4.2  CL 103 103  CO2 28 29  GLUCOSE 123* 110*  BUN 15 14    CREATININE 0.62 0.71  CALCIUM 8.8* 8.8*  GFRNONAA >60 >60  GFRAA >60 >60  ANIONGAP 6 7     Hematology Recent Labs  Lab 10/10/18 1925 10/11/18 0446  WBC 8.7 7.8  RBC 3.97 3.74*  HGB 12.1 11.4*  HCT 38.6 36.4  MCV 97.2 97.3  MCH 30.5 30.5  MCHC 31.3 31.3  RDW 13.4 13.5  PLT 268 246    Cardiac Enzymes Recent Labs  Lab 10/10/18 1925 10/11/18 0920 10/11/18 1507 10/11/18 2121  TROPONINI <0.03 <0.03 <0.03 <0.03   No results for input(s): TROPIPOC in the last 168 hours.   BNPNo results for input(s): BNP, PROBNP in the last 168 hours.   DDimer No results for input(s): DDIMER in the last 168 hours.   Radiology    Ct Cervical Spine Wo Contrast  Result Date: 10/11/2018 IMPRESSION: 1. Interval C3 through C6 laminectomies and posterior fusion with expected early postoperative change. 2. Posterior decompression without osseous canal stenosis. Unchanged severe upper cervical neural foraminal narrowing. Electronically Signed   By: Elon Alas M.D.   On: 10/11/2018 17:11    Cardiac Studies   [facility] 01/14/2018: Conclusion     Prox LAD lesion is 20% stenosed.  1. Mild  nonobstructive coronary artery disease. There is 20-30% stenosis in the LAD. Atretic LIMA to LAD 2. Mildly elevated left ventricular end-diastolic pressure.  Recommendations: The patient has no obstructive coronary artery disease. Continue medical therapy. If the patient remains in A. fib with RVR, I recommend proceeding with TEE guided cardioversion tomorrow. Resume heparin drip at 6 PM today if no bleeding complications.  __________  TEE/DCCV 01/15/2018: Study Conclusions  - Left ventricle: The cavity size was normal. Wall thickness was normal. Systolic function was moderately reduced. The estimated ejection fraction was in the range of 35% to 40%. Diffuse hypokinesis. No evidence of thrombus. - Mitral valve: There was mild to moderate regurgitation. - Left atrium: The atrium  was dilated. No evidence of thrombus in the atrial cavity or appendage. - Right atrium: The atrium was dilated. No evidence of thrombus in the atrial cavity or appendage. - Atrial septum: There was a small stretched patent foramen ovale. Echo contrast study showed a small bidirectional atrial level shunt, at baseline or with provocation.  Impressions:  - Successful cardioversion. No cardiac source of emboli was indentified. __________  TTE 04/06/2018: Study Conclusions  - Left ventricle: The cavity size was normal. Wall thickness was increased in a pattern of mild LVH. Systolic function was mildly to moderately reduced. The estimated ejection fraction was in the range of 40% to 45%. There is hypokinesis of the anteroseptal and inferoseptal myocardium. Features are consistent with a pseudonormal left ventricular filling pattern, with concomitant abnormal relaxation and increased filling pressure (grade 2 diastolic dysfunction). - Mitral valve: There was mild regurgitation. - Left atrium: The atrium was moderately dilated. - Right ventricle: The cavity size was normal. Systolic function was normal. - Atrial septum: A septal defect cannot be excluded. - Tricuspid valve: There was mild-moderate regurgitation. - Pulmonary arteries: Systolic pressure was mildly increased, in the range of 35 mm Hg to 40 mm Hg. __________  Holter 04/06/2018: Study Highlights   Normal sinus rhythm. Frequent PVCs with a total of 421 beats in 24 hours. 7 short runs of SVT that likely represent atrial fibrillation.  __________  Echo 09/27/2018 ([facility]): INTERPRETATION ---------------------------------------------------------------  MODERATE LV DYSFUNCTION (See above) WITH MILD LVH  MILD RV SYSTOLIC DYSFUNCTION (See above)  VALVULAR REGURGITATION: TRIVIAL MR, TRIVIAL PR, MILD TR  NO VALVULAR STENOSIS  SMALL PROBABLE PATENT FORAMEN OVALE BY COLOR DOPPLER WITH  INDETERMINATE  MICROCAVITATION STUDY  DEFINITY USED TO ENHANCE ENDOCARDIAL BORDERS  ATRIAL FIBRILLATION THROUGHOUT ECHO TODAY  NO SSN IMAGES DUE TO CERVICAL BRACE  Patient Profile     77 y.o. 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  Assessment & Plan    1. Persistent Afib with RVR: -Remains in Afib with ventricular rates in the 80s to 120s bpm -Likely exacerbated by her severe arthralgias, better control of her pain will need to be achieved in an effort to improve her ventricular rates, this has been deferred to IM and should be addressed this admission  -Add short acting diltiazem 30 mg q 8 hours (ideally would like to avoid this long term given her cardiomyopathy) -Continue Lopressor 100 mg bid -At baseline, her sinus rate is bradycardic. We will need to be cautious with escalation of rate-controlling therapy given this -Continue Eliquis 5 mg bid -CHADS2VASc at least  6 (CHF, HTN, age x 2, vascular disease, female) -Ultimately, she may require Tikosyn, though this can be established when she is back to her baseline -Did not previously tolerate amiodarone   2. HFrEF secondary to mixed ICM/NICM: -She does not appear grossly volume up at this time -Recent echo as above, no need to repeat at this time -Lopressor as above, as her ventricular rates improve, consider transition to Toprol XL or Coreg given her cardiomyopathy -Look to add ACEi/ARB/Entresto/spironolactone as BP allows  3. CAD s/p CABG: -Ruled out -[facility] -Recent nonobstructive LHC 12/2017 -No plans for inpatient ischemic evaluation at this time  4. PVCs: -Improved -Lopressor as above -Magnesium pending -Potassium at goal as of 10/21  5.  HTN: -Blood pressure well controlled -Isolated soft BP overnight  -Continue Lopressor 100 mg bid as above  6. HLD: -Crestor held on 10/21 to see if this would help with her arthralgias  - -Goal LDL < 70 with a most recent LDL of 80 from 06/2017  7. Arthralgias: -Per IM -CK normal   For questions or updates, please contact [place] Please consult [email or web address] for contact info under Cardiology/STEMI.    Signed, Christell Faith, PA-C [facility] Pager: [phone] 10/12/2018, 8:42 AM

## 2018-10-12 NOTE — Plan of Care (Signed)
  Problem: Education: Goal: Knowledge of General Education information will improve Description: Including pain rating scale, medication(s)/side effects and non-pharmacologic comfort measures Outcome: Progressing   Problem: Health Behavior/Discharge Planning: Goal: Ability to manage health-related needs will improve Outcome: Progressing   Problem: Clinical Measurements: Goal: Will remain free from infection Outcome: Progressing   

## 2018-10-12 NOTE — Progress Notes (Signed)
Pt scheduled for Diltiazem, however bp is 102/54, HR 102.  Okay to hold per Christell Faith, PA. Will continue to monitor.

## 2018-10-12 NOTE — Consult Note (Signed)
Referring Physician:  No referring provider defined for this encounter.  Primary Physician:  Jerrol Banana., MD  Chief Complaint: Neck pain  History of Present Illness: Teresa [name] is a 77 y.o. female who presents as a consult for chief complaint of neck pain. She is 3 weeks status post C3-C6 posterior cervical fusion for cervical myelopathy performed by Dr. Dava Najjar.  Presenting to the emergency room 2 days ago for tachycardia and was admitted for A. fib with RVR.  When she arrived to room she complained of 6-10/10 neck pain with bilateral upper extremity pain. She has been wearing collar as instructed and continues to take post op medications (oxycodone and muscle relaxer).  On evaluation, after receiving medications, patient states pain is 5/10 and upper extremity pain has improved (pain occurred in posterior aspect of upper extremities simultaneously) She has a follow-up appointment scheduled at Dr. Dimas Aguas office on Friday, 10/15/2018 in regards to new presentation of arm pain following surgical procedure. Leg pain symptoms she was experiencing prior to surgery remain resolved.   Review of Systems:  A 10 point review of systems is negative, except for the pertinent positives and negatives detailed in the HPI.  Past Medical History: Past Medical History:  Diagnosis Date  . Arthritis   . Asthma   . Back pain   . Breast cancer ([place]) 2009   left breast   . CAD (coronary artery disease)    a. 1994 s/p CABG x 1 (LIMA->LAD); b. 03/2015 MV: No ischemia; c. MV 11/18: small fixed apical defect likely secondary to breast attenuation, EF of 42%, frequent PVCs; d. 12/2017 Cath: LM nl, LAD 20p, D1/2/3 nl, LCX min irregs, OM1/2/3 min irregs, RCA nl, RPDA nl, RPL1/2 nl, LIMA->LAD atretic.  Marland Kitchen Dental crowns present    caps- left back top, right back bottom  . Gastroesophageal reflux disease   . Hypertension   . MI (myocardial infarction) ([place]) 1994  . Mixed Ischemic &  Nonischemic cardiomyopathy    a. 2013 EF 40%;  b. 03/2015 Echo: EF 55-60%, mild MR, mod dil LA, nl RV fxn; c. 12/18 Echo: EF of 35-40%, hypokinesis of the anteroseptal, and apical myocardium, mild to mod MR, mod dil LA, nl RV fxn, PASP 53 mmHg; d. 12/2017 TEE: EF 35-40%, diff HK, mild to mod MR. small PFO. No LAA/RAA thrombus.  Marland Kitchen PAF (paroxysmal atrial fibrillation) ([place])    a. diagnosed 01/13/2018; b. CHADS2VASc = 6 --> Eliquis; c. 12/2017 s/p TEE/DCCV. Amio started but d/c'd 01/2018 2/2 n/anorexia.  . Personal history of radiation therapy   . Psoriasis   . PSVT (paroxysmal supraventricular tachycardia) ([place])    a. 02/2015 Holter: short runs of SVT and NSVT.  Marland Kitchen Pulmonary hypertension ([place])   . PVC's (premature ventricular contractions)    a. 03/2018 24h Holter: Freq PVC's with a total of 421 beats in 24 hrs. 7 short runs of SVT likely representing Afib.  . Rheumatoid arthritis ([facility])    feet, hands  . Vertigo    approx 2x/yr    Past Surgical History: Past Surgical History:  Procedure Laterality Date  . ABDOMINAL HYSTERECTOMY    . back fusion    . BREAST BIOPSY Right    1991 negative  . BREAST EXCISIONAL BIOPSY Left    2009 positive  . BREAST LUMPECTOMY    . [place] N/A 01/15/2018   Procedure: CARDIOVERSION;  Surgeon: Wellington Hampshire, MD;  Location: [facility] ORS;  Service: Cardiovascular;  Laterality: N/A;  . CHOLECYSTECTOMY    . CORONARY ARTERY BYPASS GRAFT  1994   1 vessel - [facility]  . FOOT ARTHRODESIS Right 07/24/2016   Procedure: FUSION FIRST METATARSAL CUNEIFORM JOINT RIGHT FOOT, FUSION SECOND METATARSAL CUNEFORM JOINT BUNION REPAIR RIGHT FOOT;  Surgeon: Albertine Patricia, DPM;  Location: [place];  Service: Podiatry;  Laterality: Right;  . FOOT FRACTURE SURGERY    . HARDWARE REMOVAL Right 07/24/2016   Procedure: REMOVAL HARDWARE LATERAL MALLEOUS RIGHT ANKLE;  Surgeon: Albertine Patricia, DPM;  Location: [place];  Service: Podiatry;  Laterality: Right;  REMOVAL OF PIN WHICH WAS INTACT  . HERNIA REPAIR    . NECK SURGERY    . RIGHT/LEFT HEART CATH AND CORONARY/GRAFT ANGIOGRAPHY N/A 01/14/2018   Procedure: LEFT HEART CATH AND CORONARY ANGIOGRAPHY;  Surgeon: Wellington Hampshire, MD;  Location: [place] [facility];  Service: Cardiovascular;  Laterality: N/A;  . TEE WITHOUT CARDIOVERSION N/A 01/15/2018   Procedure: TRANSESOPHAGEAL ECHOCARDIOGRAM (TEE);  Surgeon: Wellington Hampshire, MD;  Location: [facility];  Service: Cardiovascular;  Laterality: N/A;  . TOTAL HIP ARTHROPLASTY Left   . TOTAL KNEE ARTHROPLASTY Left     Allergies: Allergies as of 10/10/2018 - Review Complete 10/10/2018  Allergen Reaction Noted  . Sulfa antibiotics Hives 03/08/2014  . Ivp dye [iodinated diagnostic agents] Swelling 02/26/2015  . Amiodarone Nausea And Vomiting and Cough 01/27/2018  . Codeine Other (See Comments) 03/08/2014    Medications:  Current Facility-Administered Medications:  .  acetaminophen (TYLENOL) tablet 650 mg, 650 mg, Oral, Q6H PRN, 650 mg at 10/12/18 0557 **OR** acetaminophen (TYLENOL) suppository 650 mg, 650 mg, Rectal, Q6H PRN, Lance Coon, MD .  apixaban [facility] [place]) tablet 5 mg, 5 mg, Oral, BID, Lance Coon, MD, 5 mg at 10/12/18 1962 .  Apremilast TABS 30 mg, 30 mg, Oral, BID, Lance Coon, MD, 30 mg at 10/11/18 2326 .  diltiazem (CARDIZEM) tablet 30 mg, 30 mg, Oral, Q8H, Dunn, Ryan M, PA-C .  gabapentin (NEURONTIN) capsule 600 mg, 600 mg, Oral, TID, Lance Coon, MD, 600 mg at 10/12/18 2297 .  menthol-cetylpyridinium (CEPACOL) lozenge 3 mg, 1 lozenge, Oral, PRN, Pyreddy, Pavan, MD, 3 mg at 10/11/18 2224 .  methocarbamol (ROBAXIN) tablet 750 mg, 750 mg, Oral, TID, Pyreddy, Pavan, MD, 750 mg at 10/12/18 0824 .  metoprolol tartrate (LOPRESSOR) tablet 100 mg, 100 mg, Oral, BID, Harrie Foreman, MD, 100 mg at 10/12/18 0435 .  mometasone-formoterol (DULERA) 200-5 MCG/ACT inhaler 2 puff, 2 puff,  Inhalation, BID, Lance Coon, MD, 2 puff at 10/12/18 [phone]  montelukast (SINGULAIR) tablet 10 mg, 10 mg, Oral, Daily PRN, Lance Coon, MD .  ondansetron [facility]) tablet 4 mg, 4 mg, Oral, Q6H PRN **OR** ondansetron (ZOFRAN) injection 4 mg, 4 mg, Intravenous, Q6H PRN, Lance Coon, MD .  oxyCODONE (Oxy IR/ROXICODONE) immediate release tablet 5 mg, 5 mg, Oral, Q4H PRN, Lance Coon, MD, 5 mg at 10/12/18 1211 .  oxyCODONE (OXYCONTIN) 12 hr tablet 10 mg, 10 mg, Oral, Q12H, Pyreddy, Pavan, MD, 10 mg at 10/12/18 0942 .  senna-docusate (Senokot-S) tablet 2 tablet, 2 tablet, Oral, BID, Lance Coon, MD, 2 tablet at 10/12/18 [phone] .  venlafaxine XR (EFFEXOR-XR) 24 hr capsule 150 mg, 150 mg, Oral, Daily, Lance Coon, MD, 150 mg at 10/12/18 1740   Social History: Social History   Tobacco Use  . Smoking status: Former Smoker    Packs/day: 0.50  Years: 35.00    Pack years: 17.50    Types: Cigarettes  . Smokeless tobacco: Former Systems developer    Quit date: 12/22/1978  . Tobacco comment: Quit approx 1990  Substance Use Topics  . Alcohol use: No  . Drug use: No    Family Medical History: Family History  Problem Relation Age of Onset  . Heart attack Father   . Congestive Heart Failure Mother   . Breast cancer Mother 73  . Kidney failure Sister   . Prostate cancer Brother   . Bladder Cancer Brother   . Lung cancer Sister   . Arthritis Sister   . Breast cancer Sister   . Arthritis Sister   . Breast cancer Sister   . Lung cancer Sister   . Lung cancer Brother   . Arthritis Brother     Physical Examination: Vitals:   10/12/18 0739 10/12/18 0954  BP: (!) 111/59 94/74  Pulse: 84 97  Resp:    Temp:    SpO2: 92% 93%     General: Patient is well developed, well nourished, calm, collected, and in no apparent distress. Cervical collar present.   Psychiatric: Patient is non-anxious.  ENT:  Oral mucosa appears well hydrated.  Respiratory: Patient is breathing without any  difficulty.  Extremities: No edema.  Vascular: Palpable pulses in dorsal pedal vessels.  Skin:   On exposed skin, there are no abnormal skin lesions. Incision site dressed - clean and dry  NEUROLOGICAL:  General: In no acute distress.   Awake, alert, oriented to person, place, and time. Facial tone is symmetric.   Strength: Side Biceps Triceps Deltoid Interossei Grip Wrist Ext. Wrist Flex.  R 5 5 5 5 5 5 5   L 5 5 5 5 5 5 5    Side Iliopsoas Quads Hamstring PF DF EHL  R 5 5 5 5 5 5   L 5 5 5 5 5 5   Bilateral upper and lower extremity sensation is intact to light touch and pin prick.  Gait not assessed  Imaging: EXAM: CT CERVICAL SPINE WITHOUT CONTRAST  TECHNIQUE: Multidetector CT imaging of the cervical spine was performed without intravenous contrast. Multiplanar CT image reconstructions were also generated.  COMPARISON:  None.  FINDINGS: ALIGNMENT: Straightened lordosis.  Vertebral bodies in alignment.  SKULL BASE AND VERTEBRAE: Status post C3 through C6 posterior instrumentation and laminectomies with posterior bone graft material. Intact well-seated facet screws C3 through C6 with bridging rods. Cervical vertebral bodies intact. Chronic severe C5-6 and C6-7 disc height loss and endplate spurring. 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  SOFT TISSUES AND SPINAL CANAL: Scattered tiny bone fragments and trace effusion along posterior surgical approach. Minimal residual subcutaneous gas along surgical approach.  DISC LEVELS: Posterior decompression without canal stenosis. Unchanged severe C2-3 upper C4-5 neural foraminal narrowing. Stable moderate C5-6 and C6-7 neural foraminal narrowing.  UPPER CHEST: Lung apices are clear.  OTHER: None.  IMPRESSION: 1. Interval C3 through C6 laminectomies and posterior fusion with expected early postoperative change. 2. Posterior decompression without  osseous canal stenosis. Unchanged severe upper cervical neural foraminal narrowing.   Assessment and Plan: Ms. Jeancharles is a pleasant 77 y.o. female with neck pain 3 weeks status post C3-C6 posterior fusion.  Recommend continued pain control with current regimen of postop medications (oxycodone, muscle relaxer) since this seems to be adequately controlling pain. Consider Toradol injection if pain becomes more severe/intolerable.  Dr. Izora Ribas has reviewed imaging. Advised to keep scheduled follow-up  with Dr. Denna Haggard- Barr's office at the end of the week.  Marin Olp, PA-C Dept. of Neurosurgery

## 2018-10-12 NOTE — Progress Notes (Signed)
[facility] [place] NAME: Orrie Lascano    MR#:  [id number]  DATE OF BIRTH:  1941/11/08  SUBJECTIVE:  CHIEF COMPLAINT:   Chief Complaint  Patient presents with  . Tachycardia  Patient seen and evaluated today Heart rate better controlled Still has neck pain and currently has a brace to the neck No complaints of chest pain Has generalized body aches  REVIEW OF SYSTEMS:    ROS  CONSTITUTIONAL: No documented fever. No fatigue, weakness. No weight gain, no weight loss.  Generalized body aches EYES: No blurry or double vision.  ENT: No tinnitus. No postnasal drip. No redness of the oropharynx.  Has neck pain RESPIRATORY: No cough, no wheeze, no hemoptysis. No dyspnea.  CARDIOVASCULAR: No chest pain. No orthopnea. No palpitations. No syncope.  GASTROINTESTINAL: No nausea, no vomiting or diarrhea. No abdominal pain. No melena or hematochezia.  GENITOURINARY: No dysuria or hematuria.  ENDOCRINE: No polyuria or nocturia. No heat or cold intolerance.  HEMATOLOGY: No anemia. No bruising. No bleeding.  INTEGUMENTARY: No rashes. No lesions.  MUSCULOSKELETAL: No arthritis. No swelling. No gout.  NEUROLOGIC: No numbness, tingling, or ataxia. No seizure-type activity.  PSYCHIATRIC: No anxiety. No insomnia. No ADD.   DRUG ALLERGIES:   Allergies  Allergen Reactions  . Sulfa Antibiotics Hives  . Ivp Dye [Iodinated Diagnostic Agents] Swelling  . Amiodarone Nausea And Vomiting and Cough  . Codeine Other (See Comments)    ALTERED MENTAL STATUS    VITALS:  Blood pressure 94/74, pulse 97, temperature (!) 97.5 F (36.4 C), temperature source Oral, resp. rate 18, height 5\' 4"  (1.626 m), weight 69.7 kg, SpO2 93 %.  PHYSICAL EXAMINATION:   Physical Exam  GENERAL:  77 y.o.-year-old patient lying in the bed with no acute distress.  Has cervical collar EYES: Pupils equal, round, reactive to light and accommodation. No scleral icterus. Extraocular  muscles intact.  HEENT: Head atraumatic, normocephalic. Oropharynx and nasopharynx clear.  Has neck pain and has a cervical brace NECK:  Supple, no jugular venous distention. No thyroid enlargement, no tenderness.  LUNGS: Normal breath sounds bilaterally, no wheezing, rales, rhonchi. No use of accessory muscles of respiration.  CARDIOVASCULAR: S1, S2 irregular. No murmurs, rubs, or gallops.  ABDOMEN: Soft, nontender, nondistended. Bowel sounds present. No organomegaly or mass.  EXTREMITIES: No cyanosis, clubbing or edema b/l.    NEUROLOGIC: Cranial nerves II through XII are intact. No focal Motor or sensory deficits b/l.   PSYCHIATRIC: The patient is alert and oriented x 3.  SKIN: No obvious rash, lesion, or ulcer.   LABORATORY PANEL:   CBC Recent Labs  Lab 10/11/18 0446  WBC 7.8  HGB 11.4*  HCT 36.4  PLT 246   ------------------------------------------------------------------------------------------------------------------ Chemistries  Recent Labs  Lab 10/11/18 0446 10/12/18 0915  NA 139  --   K 4.2  --   CL 103  --   CO2 29  --   GLUCOSE 110*  --   BUN 14  --   CREATININE 0.71  --   CALCIUM 8.8*  --   MG  --  1.9   ------------------------------------------------------------------------------------------------------------------  Cardiac Enzymes Recent Labs  Lab 10/11/18 2121  TROPONINI <0.03   ------------------------------------------------------------------------------------------------------------------  RADIOLOGY:  Ct Cervical Spine Wo Contrast  Result Date: 10/11/2018 CLINICAL DATA:  Severe pain with extremity burning sensation. Status post ACDF September 23, 2018. EXAM: CT CERVICAL SPINE WITHOUT CONTRAST TECHNIQUE: Multidetector CT imaging of the cervical spine was performed without intravenous contrast.  Multiplanar CT image reconstructions were also generated. COMPARISON:  None. FINDINGS: ALIGNMENT: Straightened lordosis.  Vertebral bodies in alignment.  SKULL BASE AND VERTEBRAE: Status post C3 through C6 posterior instrumentation and laminectomies with posterior bone graft material. Intact well-seated facet screws C3 through C6 with bridging rods. Cervical vertebral bodies intact. Chronic severe C5-6 and C6-7 disc height loss and endplate spurring. 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 SOFT TISSUES AND SPINAL CANAL: Scattered tiny bone fragments and trace effusion along posterior surgical approach. Minimal residual subcutaneous gas along surgical approach. DISC LEVELS: Posterior decompression without canal stenosis. Unchanged severe C2-3 upper C4-5 neural foraminal narrowing. Stable moderate C5-6 and C6-7 neural foraminal narrowing. UPPER CHEST: Lung apices are clear. OTHER: None. IMPRESSION: 1. Interval C3 through C6 laminectomies and posterior fusion with expected early postoperative change. 2. Posterior decompression without osseous canal stenosis. Unchanged severe upper cervical neural foraminal narrowing. Electronically Signed   By: Elon Alas M.D.   On: 10/11/2018 17:11     ASSESSMENT AND PLAN:   77 year old female patient with history of atrial fibrillation, cardioversion, breast cancer, coronary artery disease, GERD, hypertension, hyperlipidemia, acid reflux currently under hospitalist service for atrial fibrillation  -Atrial fibrillation with rapid ventricular rate Increased dose of metoprolol for rate control Oral Cardizem also added by cardiology for better control of heart rate TEE and cardioversion have been deferred by cardiology Medical management for rate control Anticoagulation with oral Eliquis  -History of recent cervical spinal fusion surgery CT cervical spine done for persistent and severe neck pain Hardware has been intact Neurosurgery consult for any new recommendations Continue pain management with OxyContin and OxyIR Case has been discussed with  neurosurgery team at [facility] which has done the surgery Pain management for now  -Chronic systolic heart failure If blood pressure allows we will add ACE inhibitor and ARB/Entresto and Aldactone Currently on Lopressor  -Coronary artery disease status post CABG Recent nonobstructive left heart cath in January 2019 No plans of ischemic cardiac work-up this time  -Occasional PVCs Monitor and replace electrolytes  -DVT prophylaxis On oral Eliquis for anticoagulation   All the records are reviewed and case discussed with Care Management/Social Worker. Management plans discussed with the patient, family and they are in agreement.  CODE STATUS: Full code  DVT Prophylaxis: SCDs  TOTAL TIME TAKING CARE OF THIS PATIENT: 33 minutes.   POSSIBLE D/C IN 1 to 2 DAYS, DEPENDING ON CLINICAL CONDITION.  Saundra Shelling M.D on 10/12/2018 at 1:11 PM  Between 7am to 6pm - Pager - [phone]  After 6pm go to [email or web address] - password [email or web address] [facility]  Office  [phone]  CC: Primary care physician; Jerrol Banana., MD  Note: This dictation was prepared with Dragon dictation along with smaller phrase technology. Any transcriptional errors that result from this process are unintentional.

## 2018-10-13 DIAGNOSIS — E785 Hyperlipidemia, unspecified: Secondary | ICD-10-CM

## 2018-10-13 DIAGNOSIS — I25728 Atherosclerosis of autologous artery coronary artery bypass graft(s) with other forms of angina pectoris: Secondary | ICD-10-CM

## 2018-10-13 MED ORDER — DILTIAZEM HCL 30 MG PO TABS: 30.0000 mg | ORAL_TABLET | Freq: Three times a day (TID) | ORAL | 0 refills | Status: DC

## 2018-10-13 MED ORDER — OXYCODONE HCL ER 10 MG PO T12A: 10.0000 mg | EXTENDED_RELEASE_TABLET | Freq: Two times a day (BID) | ORAL | 0 refills | Status: AC

## 2018-10-13 MED ORDER — METOPROLOL SUCCINATE ER 100 MG PO TB24: 100.0000 mg | ORAL_TABLET | Freq: Two times a day (BID) | ORAL | 0 refills | Status: DC

## 2018-10-13 NOTE — Progress Notes (Signed)
Progress Note  Patient Name: Teresa [name] Date of Encounter: 10/13/2018  Primary Cardiologist: Fletcher Anon  Subjective   Feels somewhat better today Wearing her neck collar Paravertebral muscle spasms, posterior shoulders bilaterally Has a hot blanket/pack at home but has not been using it.  Needs to use it Husband is ill, unable to take care of her at home, she is going to her sister's  Discussed with nursing, blood pressure running low  Inpatient Medications      Scheduled Meds: . apixaban  5 mg Oral BID  . Apremilast  30 mg Oral BID  . gabapentin  600 mg Oral TID  . methocarbamol  750 mg Oral TID  . metoprolol tartrate  100 mg Oral BID  . mometasone-formoterol  2 puff Inhalation BID  . senna-docusate  2 tablet Oral BID  . venlafaxine XR  150 mg Oral Daily   Continuous Infusions: PRN Meds: acetaminophen **OR** acetaminophen, menthol-cetylpyridinium, montelukast, ondansetron **OR** ondansetron (ZOFRAN) IV, oxyCODONE     Vital Signs    Vitals:   10/12/18 2354 10/13/18 0338 10/13/18 0744 10/13/18 0916  BP: 115/70 114/75 102/62 (!) 107/50  Pulse: 98 96 91 77  Resp:    18  Temp:  98.4 F (36.9 C) (!) 97.4 F (36.3 C)   TempSrc:  Oral Oral   SpO2:  94% 93% 94%  Weight:  81.1 kg    Height:        Intake/Output Summary (Last 24 hours) at 10/13/2018 1707 Last data filed at 10/13/2018 1009 Gross per 24 hour  Intake 0 ml  Output 1180 ml  Net -1180 ml   Filed Weights   10/10/18 2311 10/12/18 0424 10/13/18 0338  Weight: 82.8 kg 69.7 kg 81.1 kg    Telemetry    Afib with ventricular rates in the 80 to 100 bpm- Personally Reviewed  ECG    n/a - Personally Reviewed  Physical Exam   Constitutional:  oriented to person, place, and time. No distress.  Neck collar in place HENT:  Head: Normocephalic and atraumatic.  Eyes:  no discharge. No scleral icterus.  Neck: Normal range of motion. Neck supple. No JVD present.  Cardiovascular: Irregularly  irregular , normal heart sounds and intact distal pulses. Exam reveals no gallop and no friction rub. No edema No murmur heard. Pulmonary/Chest: Effort normal and breath sounds normal. No stridor. No respiratory distress.  no wheezes.  no rales.  no tenderness.  Abdominal: Soft.  no distension.  no tenderness.  Musculoskeletal: Normal range of motion.  no  tenderness or deformity.  Neurological:  normal muscle tone. Coordination normal. No atrophy Skin: Skin is warm and dry. No rash noted. not diaphoretic.  Psychiatric:  normal mood and affect. behavior is normal. Thought content normal.    Labs    Chemistry Recent Labs  Lab 10/10/18 1925 10/11/18 0446  NA 137 139  K 4.1 4.2  CL 103 103  CO2 28 29  GLUCOSE 123* 110*  BUN 15 14  CREATININE 0.62 0.71  CALCIUM 8.8* 8.8*  GFRNONAA >60 >60  GFRAA >60 >60  ANIONGAP 6 7     Hematology Recent Labs  Lab 10/10/18 1925 10/11/18 0446  WBC 8.7 7.8  RBC 3.97 3.74*  HGB 12.1 11.4*  HCT 38.6 36.4  MCV 97.2 97.3  MCH 30.5 30.5  MCHC 31.3 31.3  RDW 13.4 13.5  PLT 268 246    Cardiac Enzymes Recent Labs  Lab 10/10/18 1925 10/11/18 0920 10/11/18 1507 10/11/18  2121  TROPONINI <0.03 <0.03 <0.03 <0.03   No results for input(s): TROPIPOC in the last 168 hours.   BNPNo results for input(s): BNP, PROBNP in the last 168 hours.   DDimer No results for input(s): DDIMER in the last 168 hours.   Radiology    Ct Cervical Spine Wo Contrast  Result Date: 10/11/2018 IMPRESSION: 1. Interval C3 through C6 laminectomies and posterior fusion with expected early postoperative change. 2. Posterior decompression without osseous canal stenosis. Unchanged severe upper cervical neural foraminal narrowing. Electronically Signed   By: Elon Alas M.D.   On: 10/11/2018 17:11    Cardiac Studies   [facility] 01/14/2018: Conclusion     Prox LAD lesion is 20% stenosed.  1. Mild nonobstructive coronary artery disease. There is 20-30%  stenosis in the LAD. Atretic LIMA to LAD 2. Mildly elevated left ventricular end-diastolic pressure.  Recommendations: The patient has no obstructive coronary artery disease. Continue medical therapy. If the patient remains in A. fib with RVR, I recommend proceeding with TEE guided cardioversion tomorrow. Resume heparin drip at 6 PM today if no bleeding complications.  __________  TEE/DCCV 01/15/2018: Study Conclusions  - Left ventricle: The cavity size was normal. Wall thickness was normal. Systolic function was moderately reduced. The estimated ejection fraction was in the range of 35% to 40%. Diffuse hypokinesis. No evidence of thrombus. - Mitral valve: There was mild to moderate regurgitation. - Left atrium: The atrium was dilated. No evidence of thrombus in the atrial cavity or appendage. - Right atrium: The atrium was dilated. No evidence of thrombus in the atrial cavity or appendage. - Atrial septum: There was a small stretched patent foramen ovale. Echo contrast study showed a small bidirectional atrial level shunt, at baseline or with provocation.  Impressions:  - Successful cardioversion. No cardiac source of emboli was indentified. __________  TTE 04/06/2018: Study Conclusions  - Left ventricle: The cavity size was normal. Wall thickness was increased in a pattern of mild LVH. Systolic function was mildly to moderately reduced. The estimated ejection fraction was in the range of 40% to 45%. There is hypokinesis of the anteroseptal and inferoseptal myocardium. Features are consistent with a pseudonormal left ventricular filling pattern, with concomitant abnormal relaxation and increased filling pressure (grade 2 diastolic dysfunction). - Mitral valve: There was mild regurgitation. - Left atrium: The atrium was moderately dilated. - Right ventricle: The cavity size was normal. Systolic function was normal. - Atrial septum:  A septal defect cannot be excluded. - Tricuspid valve: There was mild-moderate regurgitation. - Pulmonary arteries: Systolic pressure was mildly increased, in the range of 35 mm Hg to 40 mm Hg. __________  Holter 04/06/2018: Study Highlights   Normal sinus rhythm. Frequent PVCs with a total of 421 beats in 24 hours. 7 short runs of SVT that likely represent atrial fibrillation.  __________  Echo 09/27/2018 ([facility]): INTERPRETATION ---------------------------------------------------------------  MODERATE LV DYSFUNCTION (See above) WITH MILD LVH  MILD RV SYSTOLIC DYSFUNCTION (See above)  VALVULAR REGURGITATION: TRIVIAL MR, TRIVIAL PR, MILD TR  NO VALVULAR STENOSIS  SMALL PROBABLE PATENT FORAMEN OVALE BY COLOR DOPPLER WITH INDETERMINATE  MICROCAVITATION STUDY  DEFINITY USED TO ENHANCE ENDOCARDIAL BORDERS  ATRIAL FIBRILLATION THROUGHOUT ECHO TODAY  NO SSN IMAGES DUE TO CERVICAL BRACE  Patient Profile     77 y.o. 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  Assessment & Plan    1. Persistent Afib with RVR: Continue metoprolol succinate 100 twice daily Blood pressure low, would stop diltiazem -Continue Eliquis 5 mg bid -CHADS2VASc at least 6 (CHF, HTN, age x 2, vascular disease, female) -Did not previously tolerate amiodarone  Outpatient follow-up for consideration of cardioversion in 1 month  2. 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 CAD s/p CABG: -Recent echo [facility] as above -Recent nonobstructive LHC 12/2017  4. PVCs: Continue beta-blocker  5. HTN: -Continue metoprolol succinate 100 mg bid  Diltiazem on hold given  hypotension  6. HLD: -Crestor held on 10/21 to see if this would help with her arthralgias  Consider restarting as an outpatient  7. Arthralgias: -CK normal   Total encounter time more than 25 minutes  Greater than 50% was spent in counseling and coordination of care with the patient   For questions or updates, please contact [place] Please consult [email or web address] for contact info under Cardiology/STEMI.   Signed, Esmond Plants, MD, Ph.D [facility]

## 2018-10-13 NOTE — Progress Notes (Signed)
Advanced care plan.  Purpose of the Encounter: CODE STATUS  Parties in Attendance:Patient  Patient's Decision Capacity:Good  Subjective/Patient's story: Presented to hospital with elevated heart rate   Objective/Medical story Patient has afib with rapid rate. Needs iv cardizem drip for rate control Cardiology evaluation   Goals of care determination:  Advance care directives and goals of care discussed during stay Patient and family want everything done which includes cpr , intubation and ventilator if need arises.   CODE STATUS: Full code   Time spent discussing advanced care planning: 16 minutes

## 2018-10-13 NOTE — Progress Notes (Signed)
Teresa [name] to be D/C'd Home per MD order.  Discussed prescriptions and follow up appointments with the patient. Prescriptions given to patient, medication list explained in detail. Pt verbalized understanding.  Allergies as of 10/13/2018      Reactions   Sulfa Antibiotics Hives   Ivp Dye [iodinated Diagnostic Agents] Swelling   Amiodarone Nausea And Vomiting, Cough   Codeine Other (See Comments)   ALTERED MENTAL STATUS      Medication List    STOP taking these medications   metoprolol tartrate 100 MG tablet Commonly known as:  LOPRESSOR     TAKE these medications   acetaminophen 325 MG tablet Commonly known as:  TYLENOL Take 975 mg by mouth every 8 (eight) hours. Three tablets to = 975 mg   apixaban 5 MG Tabs tablet Commonly known as:  ELIQUIS Take 1 tablet (5 mg total) by mouth 2 (two) times daily.   budesonide-formoterol 160-4.5 MCG/ACT inhaler Commonly known as:  SYMBICORT Inhale 2 puffs into the lungs 2 (two) times daily.   fluticasone 50 MCG/ACT nasal spray Commonly known as:  FLONASE Place 2 sprays into both nostrils daily as needed for allergies.   gabapentin 300 MG capsule Commonly known as:  NEURONTIN Take 600 mg by mouth 3 (three) times daily. Two capsules to = 600 mg   meclizine 25 MG tablet Commonly known as:  ANTIVERT Take 25 mg by mouth 3 (three) times daily as needed for dizziness.   methocarbamol 750 MG tablet Commonly known as:  ROBAXIN Take 750 mg by mouth 3 (three) times daily.   metoprolol succinate 100 MG 24 hr tablet Commonly known as:  TOPROL-XL Take 1 tablet (100 mg total) by mouth 2 (two) times daily. Take with or immediately following a meal.   montelukast 10 MG tablet Commonly known as:  SINGULAIR Take 10 mg by mouth daily as needed (for allergies).   MULTIVITAMIN PO Take 1 tablet by mouth daily.   OTEZLA 30 MG Tabs Generic drug:  Apremilast Take 30 mg by mouth 2 (two) times daily.   oxycodone 5 MG capsule Commonly known  as:  OXY-IR Take 1 capsule (5 mg total) by mouth every 4 (four) hours as needed for pain (Take 1/2 to 1 tablet). What changed:  Another medication with the same name was added. Make sure you understand how and when to take each.   oxyCODONE 10 mg 12 hr tablet Commonly known as:  OXYCONTIN Take 1 tablet (10 mg total) by mouth every 12 (twelve) hours for 5 days. What changed:  You were already taking a medication with the same name, and this prescription was added. Make sure you understand how and when to take each.   rosuvastatin 5 MG tablet Commonly known as:  CRESTOR TAKE 1 TABLET BY MOUTH EVERY DAY   sennosides-docusate sodium 8.6-50 MG tablet Commonly known as:  SENOKOT-S Take 2 tablets by mouth 2 (two) times daily.   venlafaxine 75 MG tablet Commonly known as:  EFFEXOR Take 2 tablets (150 mg total) by mouth daily.       Vitals:   10/13/18 0744 10/13/18 0916  BP: 102/62 (!) 107/50  Pulse: 91 77  Resp:  18  Temp: (!) 97.4 F (36.3 C)   SpO2: 93% 94%    Tele box removed and returned. Skin clean, dry and intact without evidence of skin break down, no evidence of skin tears noted. IV catheter discontinued intact. Site without signs and symptoms of complications. Dressing and pressure  applied. Pt denies pain at this time. No complaints noted.  An After Visit Summary was printed and given to the patient. Patient escorted via [place], and D/C home via private auto.  Teresa [name]

## 2018-10-13 NOTE — Care Management Note (Signed)
Case Management Note  Patient Details  Name: Teresa [name] MRN: [id number] Date of Birth: [birth year]  Subjective/Objective:     Patient independent from home; lives with husband.  Recent neck surgery.  Admitted to hospital with A-Fib with RVR.  Chronic Eliquis.  Current with PCP.  Denies difficulty obtaining medications or with transportation.  Uses [facility] in [place].  Open to [place] for home health OT, OT after neck surgery.  Notified Corene Cornea of discharge today and resumption orders placed by MD.  Patient will be discharging to sister's house for a few weeks.  [address] [place] [id number].  Ph- [phone].  Daughter Shayanne Gomm [phone].                Action/Plan:Discharging with home health today.           Expected Discharge Date:  10/13/18               Expected Discharge Plan:  [place]  In-House Referral:     Discharge planning Services  CM Consult  Post Acute Care Choice:  Resumption of Svcs/PTA Provider Choice offered to:     DME Arranged:    DME Agency:     HH Arranged:  OT, PT [place] Agency:  [place]  Status of Service:  Completed, signed off  If discussed at [place] of Stay Meetings, dates discussed:    Additional Comments:  Elza Rafter, RN 10/13/2018, 9:39 AM

## 2018-10-13 NOTE — Discharge Summary (Signed)
[facility] NAME: Teresa [name]    MR#:  [id number]  DATE OF BIRTH:  12-Sep-1941  DATE OF ADMISSION:  10/10/2018 ADMITTING PHYSICIAN: Lance Coon, MD  DATE OF DISCHARGE: 10/13/2018  PRIMARY CARE PHYSICIAN: Jerrol Banana., MD   ADMISSION DIAGNOSIS:  Atrial fibrillation with RVR (HCC) [I48.91] Coronary artery disease Psoriatic arthritis Hyperlipidemia GERD Recent cervical spinal fusion surgery DISCHARGE DIAGNOSIS:  Principal Problem:   Atrial fibrillation with rapid ventricular response (HCC) Active Problems:   CAD (coronary artery disease)   Hyperlipidemia   Acid reflux   Psoriatic arthritis (HCC)   Atrial fibrillation with RVR ([place])   A-fib (HCC) Recent cervical spinal fusion surgery  SECONDARY DIAGNOSIS:   Past Medical History:  Diagnosis Date  . Arthritis   . Asthma   . Back pain   . Breast cancer ([place]) 2009   left breast   . CAD (coronary artery disease)    a. 1994 s/p CABG x 1 (LIMA->LAD); b. 03/2015 MV: No ischemia; c. MV 11/18: small fixed apical defect likely secondary to breast attenuation, EF of 42%, frequent PVCs; d. 12/2017 Cath: LM nl, LAD 20p, D1/2/3 nl, LCX min irregs, OM1/2/3 min irregs, RCA nl, RPDA nl, RPL1/2 nl, LIMA->LAD atretic.  Marland Kitchen Dental crowns present    caps- left back top, right back bottom  . Gastroesophageal reflux disease   . Hypertension   . MI (myocardial infarction) ([place]) 1994  . Mixed Ischemic & Nonischemic cardiomyopathy    a. 2013 EF 40%;  b. 03/2015 Echo: EF 55-60%, mild MR, mod dil LA, nl RV fxn; c. 12/18 Echo: EF of 35-40%, hypokinesis of the anteroseptal, and apical myocardium, mild to mod MR, mod dil LA, nl RV fxn, PASP 53 mmHg; d. 12/2017 TEE: EF 35-40%, diff HK, mild to mod MR. small PFO. No LAA/RAA thrombus.  Marland Kitchen PAF (paroxysmal atrial fibrillation) ([place])    a. diagnosed 01/13/2018; b. CHADS2VASc = 6 --> Eliquis; c. 12/2017 s/p TEE/DCCV. Amio started but d/c'd 01/2018 2/2  n/anorexia.  . Personal history of radiation therapy   . Psoriasis   . PSVT (paroxysmal supraventricular tachycardia) ([place])    a. 02/2015 Holter: short runs of SVT and NSVT.  Marland Kitchen Pulmonary hypertension ([place])   . PVC's (premature ventricular contractions)    a. 03/2018 24h Holter: Freq PVC's with a total of 421 beats in 24 hrs. 7 short runs of SVT likely representing Afib.  . Rheumatoid arthritis (HCC)    feet, hands  . Vertigo    approx 2x/yr     ADMITTING HISTORY Teresa [name]  is a 77 y.o. female who presents with chief complaint as above. Diagnosed with A fib and had RVR requiring cardioversion early this year.  Recent episode of RVR during spinal fusion procedure a few weeks ago.  Had her metoprolol dose increased, but did not have the higher dose filled at home yet.  Presents tonight with palpitations and some dyspnea, in Afib RVR.  Given metoprolol 100 mg without rate control.  Started on cardizem gtt in ED on recommendation of her cardiologist, hospitalists called for admission,  HOSPITAL COURSE:  Patient was admitted to telemetry.  Started on IV Cardizem drip for rate control and seen by cardiology.  Patient was switched to oral metoprolol and oral Cardizem for rate control during his stay in the hospital.  She had a lot of neck pain and was wearing a cervical brace.  Patient had recent cervical spinal fusion  surgery at [facility].  She was worked up with CT cervical spine and hardware was intact with no displacement.  Serial troponins were negative.  Heart rate was very well controlled during the hospitalization.  Neck pain improved with oral narcotics.  upon cardiology advise, patient will be discharged on Toprol-XL 100 Mg twice daily.  CONSULTS OBTAINED:  Treatment Team:  Wellington Hampshire, MD Meade Maw, MD  DRUG ALLERGIES:   Allergies  Allergen Reactions  . Sulfa Antibiotics Hives  . Ivp Dye [Iodinated Diagnostic Agents] Swelling  . Amiodarone Nausea And Vomiting  and Cough  . Codeine Other (See Comments)    ALTERED MENTAL STATUS    DISCHARGE MEDICATIONS:   Allergies as of 10/13/2018      Reactions   Sulfa Antibiotics Hives   Ivp Dye [iodinated Diagnostic Agents] Swelling   Amiodarone Nausea And Vomiting, Cough   Codeine Other (See Comments)   ALTERED MENTAL STATUS      Medication List    STOP taking these medications   metoprolol tartrate 100 MG tablet Commonly known as:  LOPRESSOR     TAKE these medications   acetaminophen 325 MG tablet Commonly known as:  TYLENOL Take 975 mg by mouth every 8 (eight) hours. Three tablets to = 975 mg   apixaban 5 MG Tabs tablet Commonly known as:  ELIQUIS Take 1 tablet (5 mg total) by mouth 2 (two) times daily.   budesonide-formoterol 160-4.5 MCG/ACT inhaler Commonly known as:  SYMBICORT Inhale 2 puffs into the lungs 2 (two) times daily.   fluticasone 50 MCG/ACT nasal spray Commonly known as:  FLONASE Place 2 sprays into both nostrils daily as needed for allergies.   gabapentin 300 MG capsule Commonly known as:  NEURONTIN Take 600 mg by mouth 3 (three) times daily. Two capsules to = 600 mg   meclizine 25 MG tablet Commonly known as:  ANTIVERT Take 25 mg by mouth 3 (three) times daily as needed for dizziness.   methocarbamol 750 MG tablet Commonly known as:  ROBAXIN Take 750 mg by mouth 3 (three) times daily.   metoprolol succinate 100 MG 24 hr tablet Commonly known as:  TOPROL-XL Take 1 tablet (100 mg total) by mouth 2 (two) times daily. Take with or immediately following a meal.   montelukast 10 MG tablet Commonly known as:  SINGULAIR Take 10 mg by mouth daily as needed (for allergies).   MULTIVITAMIN PO Take 1 tablet by mouth daily.   OTEZLA 30 MG Tabs Generic drug:  Apremilast Take 30 mg by mouth 2 (two) times daily.   oxycodone 5 MG capsule Commonly known as:  OXY-IR Take 1 capsule (5 mg total) by mouth every 4 (four) hours as needed for pain (Take 1/2 to 1  tablet). What changed:  Another medication with the same name was added. Make sure you understand how and when to take each.   oxyCODONE 10 mg 12 hr tablet Commonly known as:  OXYCONTIN Take 1 tablet (10 mg total) by mouth every 12 (twelve) hours for 5 days. What changed:  You were already taking a medication with the same name, and this prescription was added. Make sure you understand how and when to take each.   rosuvastatin 5 MG tablet Commonly known as:  CRESTOR TAKE 1 TABLET BY MOUTH EVERY DAY   sennosides-docusate sodium 8.6-50 MG tablet Commonly known as:  SENOKOT-S Take 2 tablets by mouth 2 (two) times daily.   venlafaxine 75 MG tablet Commonly known as:  EFFEXOR Take 2 tablets (150 mg total) by mouth daily.       Today  Patient seen and evaluated today No chest pain No palpitations VITAL SIGNS:  Blood pressure (!) 107/50, pulse 77, temperature (!) 97.4 F (36.3 C), temperature source Oral, resp. rate 18, height 5\' 4"  (1.626 m), weight 81.1 kg, SpO2 94 %.  I/O:    Intake/Output Summary (Last 24 hours) at 10/13/2018 1312 Last data filed at 10/13/2018 1009 Gross per 24 hour  Intake 0 ml  Output 1780 ml  Net -1780 ml    PHYSICAL EXAMINATION:  Physical Exam  GENERAL:  77 y.o.-year-old patient lying in the bed with no acute distress.  LUNGS: Normal breath sounds bilaterally, no wheezing, rales,rhonchi or crepitation. No use of accessory muscles of respiration.  CARDIOVASCULAR: S1, S2 normal. No murmurs, rubs, or gallops.  ABDOMEN: Soft, non-tender, non-distended. Bowel sounds present. No organomegaly or mass.  NEUROLOGIC: Moves all 4 extremities. PSYCHIATRIC: The patient is alert and oriented x 3.  SKIN: No obvious rash, lesion, or ulcer.   DATA REVIEW:   CBC Recent Labs  Lab 10/11/18 0446  WBC 7.8  HGB 11.4*  HCT 36.4  PLT 246    Chemistries  Recent Labs  Lab 10/11/18 0446 10/12/18 0915  NA 139  --   K 4.2  --   CL 103  --   CO2 29  --    GLUCOSE 110*  --   BUN 14  --   CREATININE 0.71  --   CALCIUM 8.8*  --   MG  --  1.9    Cardiac Enzymes Recent Labs  Lab 10/11/18 2121  TROPONINI <0.03    Microbiology Results  Results for orders placed or performed in visit on 02/24/17  Microscopic Examination     Status: Abnormal   Collection Time: 02/24/17  2:32 PM  Result Value Ref Range Status   WBC, UA 6-10 (A) 0 - 5 /hpf Final   RBC, UA 0-2 0 - 2 /hpf Final   Epithelial Cells (non renal) 0-10 0 - 10 /hpf Final   Bacteria, UA Few None seen/Few Final    RADIOLOGY:  Ct Cervical Spine Wo Contrast  Result Date: 10/11/2018 CLINICAL DATA:  Severe pain with extremity burning sensation. Status post ACDF September 23, 2018. EXAM: CT CERVICAL SPINE WITHOUT CONTRAST TECHNIQUE: Multidetector CT imaging of the cervical spine was performed without intravenous contrast. Multiplanar CT image reconstructions were also generated. COMPARISON:  None. FINDINGS: ALIGNMENT: Straightened lordosis.  Vertebral bodies in alignment. SKULL BASE AND VERTEBRAE: Status post C3 through C6 posterior instrumentation and laminectomies with posterior bone graft material. Intact well-seated facet screws C3 through C6 with bridging rods. Cervical vertebral bodies intact. Chronic severe C5-6 and C6-7 disc height loss and endplate spurring. 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 SOFT TISSUES AND SPINAL CANAL: Scattered tiny bone fragments and trace effusion along posterior surgical approach. Minimal residual subcutaneous gas along surgical approach. DISC LEVELS: Posterior decompression without canal stenosis. Unchanged severe C2-3 upper C4-5 neural foraminal narrowing. Stable moderate C5-6 and C6-7 neural foraminal narrowing. UPPER CHEST: Lung apices are clear. OTHER: None. IMPRESSION: 1. Interval C3 through C6 laminectomies and posterior fusion with expected early postoperative change. 2. Posterior  decompression without osseous canal stenosis. Unchanged severe upper cervical neural foraminal narrowing. Electronically Signed   By: Elon Alas M.D.   On: 10/11/2018 17:11    Follow up with PCP in 1 week.  Management plans  discussed with the patient, family and they are in agreement.  CODE STATUS: Full code    Code Status Orders  (From admission, onward)         Start     Ordered   10/10/18 2306  Full code  Continuous     10/10/18 2306        Code Status History    Date Active Date Inactive Code Status Order ID Comments User Context   01/13/2018 1451 01/15/2018 1839 Full Code [id number]  Bettey Costa, MD Inpatient   01/13/2018 1328 01/13/2018 1451 DNR [id number]  Bettey Costa, MD ED    Advance Directive Documentation     Most Recent Value  Type of Advance Directive  Healthcare Power of Attorney, Living will  Pre-existing out of facility DNR order (yellow form or pink MOST form)  -  "MOST" Form in Place?  -      TOTAL TIME TAKING CARE OF THIS PATIENT ON DAY OF DISCHARGE: more than 34 minutes.   Saundra Shelling M.D on 10/13/2018 at 1:12 PM  Between 7am to 6pm - Pager - [phone]  After 6pm go to [email or web address] - password [email or web address] [facility]  Office  [phone]  CC: Primary care physician; Jerrol Banana., MD  Note: This dictation was prepared with Dragon dictation along with smaller phrase technology. Any transcriptional errors that result from this process are unintentional.

## 2018-10-13 NOTE — Plan of Care (Signed)
  Problem: Education: Goal: Knowledge of General Education information will improve Description Including pain rating scale, medication(s)/side effects and non-pharmacologic comfort measures Outcome: Progressing   Problem: Health Behavior/Discharge Planning: Goal: Ability to manage health-related needs will improve Outcome: Progressing   Problem: Clinical Measurements: Goal: Diagnostic test results will improve Outcome: Progressing   Problem: Activity: Goal: Risk for activity intolerance will decrease Outcome: Progressing   Problem: Pain Managment: Goal: General experience of comfort will improve Outcome: Progressing   Problem: Education: Goal: Knowledge of disease or condition will improve Outcome: Progressing Goal: Understanding of medication regimen will improve Outcome: Progressing

## 2018-10-14 ENCOUNTER — Telehealth: Payer: Self-pay

## 2018-10-14 ENCOUNTER — Telehealth: Payer: Self-pay | Admitting: *Deleted

## 2018-10-14 ENCOUNTER — Telehealth: Payer: Self-pay | Admitting: Family Medicine

## 2018-10-14 DIAGNOSIS — Z4789 Encounter for other orthopedic aftercare: Secondary | ICD-10-CM | POA: Diagnosis not present

## 2018-10-14 DIAGNOSIS — M4322 Fusion of spine, cervical region: Secondary | ICD-10-CM | POA: Diagnosis not present

## 2018-10-14 DIAGNOSIS — I25119 Atherosclerotic heart disease of native coronary artery with unspecified angina pectoris: Secondary | ICD-10-CM | POA: Diagnosis not present

## 2018-10-14 DIAGNOSIS — I4891 Unspecified atrial fibrillation: Secondary | ICD-10-CM | POA: Diagnosis not present

## 2018-10-14 DIAGNOSIS — I255 Ischemic cardiomyopathy: Secondary | ICD-10-CM | POA: Diagnosis not present

## 2018-10-14 DIAGNOSIS — K219 Gastro-esophageal reflux disease without esophagitis: Secondary | ICD-10-CM | POA: Diagnosis not present

## 2018-10-14 DIAGNOSIS — M4326 Fusion of spine, lumbar region: Secondary | ICD-10-CM | POA: Diagnosis not present

## 2018-10-14 DIAGNOSIS — L409 Psoriasis, unspecified: Secondary | ICD-10-CM | POA: Diagnosis not present

## 2018-10-14 DIAGNOSIS — Z981 Arthrodesis status: Secondary | ICD-10-CM | POA: Diagnosis not present

## 2018-10-14 DIAGNOSIS — G894 Chronic pain syndrome: Secondary | ICD-10-CM | POA: Diagnosis not present

## 2018-10-14 NOTE — Telephone Encounter (Signed)
Patient discharged 10/23.

## 2018-10-14 NOTE — Telephone Encounter (Signed)
-----   Message from Blain Pais sent at 10/14/2018  2:52 PM EDT ----- Regarding: tcm/ph 11/4 10 am Christell Faith, [place]

## 2018-10-14 NOTE — Telephone Encounter (Signed)
I have made the 1st attempt to contact the patient or family member in charge, in order to follow up from recently being discharged from the hospital. I left a message on voicemail requesting a call back. If pt does not call back today, I will try again tomorrow.  -MM

## 2018-10-14 NOTE — Telephone Encounter (Signed)
FYI. Thanks.

## 2018-10-14 NOTE — Telephone Encounter (Signed)
Rowe Clack, OT w/ [facility] - [phone]  Delay in OT Evaluation due to pt being in hospital. Will try to get the eval done in next 5 business days.   Thanks, [employer]

## 2018-10-15 DIAGNOSIS — Z4789 Encounter for other orthopedic aftercare: Secondary | ICD-10-CM | POA: Diagnosis not present

## 2018-10-15 DIAGNOSIS — K219 Gastro-esophageal reflux disease without esophagitis: Secondary | ICD-10-CM | POA: Diagnosis not present

## 2018-10-15 DIAGNOSIS — M4322 Fusion of spine, cervical region: Secondary | ICD-10-CM | POA: Diagnosis not present

## 2018-10-15 DIAGNOSIS — Z981 Arthrodesis status: Secondary | ICD-10-CM | POA: Diagnosis not present

## 2018-10-15 DIAGNOSIS — I4891 Unspecified atrial fibrillation: Secondary | ICD-10-CM | POA: Diagnosis not present

## 2018-10-15 DIAGNOSIS — M4326 Fusion of spine, lumbar region: Secondary | ICD-10-CM | POA: Diagnosis not present

## 2018-10-15 DIAGNOSIS — G894 Chronic pain syndrome: Secondary | ICD-10-CM | POA: Diagnosis not present

## 2018-10-15 DIAGNOSIS — I255 Ischemic cardiomyopathy: Secondary | ICD-10-CM | POA: Diagnosis not present

## 2018-10-15 DIAGNOSIS — L409 Psoriasis, unspecified: Secondary | ICD-10-CM | POA: Diagnosis not present

## 2018-10-15 DIAGNOSIS — I25119 Atherosclerotic heart disease of native coronary artery with unspecified angina pectoris: Secondary | ICD-10-CM | POA: Diagnosis not present

## 2018-10-15 NOTE — Telephone Encounter (Signed)
I have made the 2nd attempt to contact the patient or family member in charge, in order to follow up from recently being discharged from the hospital. I left a message on voicemail requesting a call back.  -MM

## 2018-10-15 NOTE — Telephone Encounter (Signed)
No answer. Left message to call back.   

## 2018-10-18 ENCOUNTER — Other Ambulatory Visit: Payer: Self-pay

## 2018-10-18 DIAGNOSIS — K219 Gastro-esophageal reflux disease without esophagitis: Secondary | ICD-10-CM | POA: Diagnosis not present

## 2018-10-18 DIAGNOSIS — Z981 Arthrodesis status: Secondary | ICD-10-CM | POA: Diagnosis not present

## 2018-10-18 DIAGNOSIS — G894 Chronic pain syndrome: Secondary | ICD-10-CM | POA: Diagnosis not present

## 2018-10-18 DIAGNOSIS — M4322 Fusion of spine, cervical region: Secondary | ICD-10-CM | POA: Diagnosis not present

## 2018-10-18 DIAGNOSIS — L409 Psoriasis, unspecified: Secondary | ICD-10-CM | POA: Diagnosis not present

## 2018-10-18 DIAGNOSIS — I255 Ischemic cardiomyopathy: Secondary | ICD-10-CM | POA: Diagnosis not present

## 2018-10-18 DIAGNOSIS — I25119 Atherosclerotic heart disease of native coronary artery with unspecified angina pectoris: Secondary | ICD-10-CM | POA: Diagnosis not present

## 2018-10-18 DIAGNOSIS — M4326 Fusion of spine, lumbar region: Secondary | ICD-10-CM | POA: Diagnosis not present

## 2018-10-18 DIAGNOSIS — Z4789 Encounter for other orthopedic aftercare: Secondary | ICD-10-CM | POA: Diagnosis not present

## 2018-10-18 DIAGNOSIS — I4891 Unspecified atrial fibrillation: Secondary | ICD-10-CM | POA: Diagnosis not present

## 2018-10-18 NOTE — Telephone Encounter (Signed)
Patient needs to contact Primary Care for the refill of Gabapentin.  Mailbox was full and could not leave a message.

## 2018-10-18 NOTE — Telephone Encounter (Signed)
Left message to call back on mobile number. Called home number and her husband answered. He said she is staying at her sister's house until she gets well.   Called patient's daughter, Coralyn Mark, ok per DPR.  Patient's daughter, contacted regarding discharge from [facility] on 10/13/18.   Patient's daughter understands to follow up with provider ? On 10/25/18 at 10am at [facility].  Patient's daughter understands discharge instructions? Yes Patient's daughter understands medications and regiment? No   She wanted to clarify that patient was off the lisinopril now. I confirmed this was not on her discharge list to take. She also said patient needed a new prescription of her Neurontin. Daughter said she had already called her neurologist and I said that's where she would need to get the prescription. She was appreciative and verbalized understanding.  Patient's daughter understands to bring all medications to this visit? Yes

## 2018-10-18 NOTE — Telephone Encounter (Signed)
Pt has not returned my calls or VMs. HFU scheduled for 10/27/18. FYI to PCP! -MM

## 2018-10-18 NOTE — Telephone Encounter (Signed)
Patient called back. She had not talked to her daughter yet. While on the phone her daughter was calling. Patient said she would talk with daughter and call us back if she has any further questions.

## 2018-10-18 NOTE — Telephone Encounter (Signed)
*  STAT* If patient is at the pharmacy, call can be transferred to refill team.   1. Which medications need to be refilled? (please list name of each medication and dose if known) Gabapenitn 300 mg  2. Which pharmacy/location (including street and city if local pharmacy) is medication to be sent to?  [facility]  3. Do they need a 30 day or 90 day supply? [place]

## 2018-10-19 NOTE — Telephone Encounter (Signed)
I spoke with Helene Kelp. She is aware of Ryan's recommendations to hold off an taking any diltiazem for now. Helene Kelp is agreeable.  She states that her mother's heart rate was in the 130's yesterday morning and the 110's today. She is requesting if the patient's post hospital appointment could be moved up to this week. Offered 10/21/18 at 10:00 am with Christell Faith, PA. Helene Kelp is agreeable. Appt r/s to 10/21/18 at 10:00 am.

## 2018-10-19 NOTE — Telephone Encounter (Signed)
Pt daughter is returning your call. 

## 2018-10-19 NOTE — Telephone Encounter (Signed)
I spoke with the patient's daughter, Helene Kelp. She had an additional question about the patient's medications.  She states the patient was given a prescription for diltiazem 30 mg take 1 tablet every 8 hours at discharge. However, this was not on her discharge medication list.   I have reviewed the patient's hospital records:  10/12/18 - progress note from Christell Faith, [place] "-Add short acting diltiazem 30 mg q 8 hours (ideally would like to avoid this long term given her cardiomyopathy)"  10/13/18- progress note from Dr. Rockey Situ "1. Persistent Afib with RVR: Continue metoprolol succinate 100 twice daily Blood pressure low, would stop diltiazem"  10/13/18- discharge summary does not include diltiazem  I have explained to the patient's daughter that the diltiazem 30 mg tablets are usually given to take as needed for break through of a-fib. I am unsure at this point if the goal was to have her use this PRN or for her not to use any diltiazem of any form at all. She is aware I will clarify this with Thurmond Butts, [place].   She states they have had multiple medication issues/ clarifications since discharge from the hospital. Per Helene Kelp, this has been very stressful for everyone since last week as she was out of town when the patient was discharged. Her brother was with the patient.  I have advised her it is very important to bring all medication bottles to the patient's follow up appointment on 10/25/18. Per Helene Kelp "I don't know if I'm going to do that" due to the fact that this has been very confusing. I explained that is the reason for bringing the bottles in is to eliminate confusion. Per Helene Kelp "I will bring a list, but I'm not sure I'm going to bring the bottles." The patient's daughter also states that a nurse had come into the home last week and really put a lot of stress on the patient and her 35 year old sister as they were trying to clarify her pain meds. I advised Helene Kelp, that we will not adjust her  pain meds, but we do still recommend the bottles be brought. Helene Kelp states she will be at the follow up appointment, but did not commit to bringing the patient's medication bottles.  She is aware I will confirm what needs to be done with the diltiazem and call her back.  She is agreeable.

## 2018-10-19 NOTE — Telephone Encounter (Signed)
Attempted to call the patient's daughter, Helene Kelp, back. No answer- unable to leave a message as the "voice mail box is full."

## 2018-10-19 NOTE — Telephone Encounter (Signed)
It is unclear why she was given a prescription of diltiazem by internal medicine at discharge as cardiology recommended stopping this. I will also route this to that physician for input. Nonetheless, she should not be taking diltiazem. From a cardiac perspective, it appears she should be taking Eliquis 5 mg bid, Toprol XL 100 mg bid, and Crestor 5 mg daily. Defer any other medication concerns to internal medicine as they completed the discharge.

## 2018-10-19 NOTE — Telephone Encounter (Signed)
Patient daughter returning our call about yesterday's conversation   Please call back

## 2018-10-19 NOTE — Telephone Encounter (Signed)
Attempted to call the patient's daughter, Helene Kelp.  No answer/ "voice mail box is full."

## 2018-10-20 NOTE — Progress Notes (Signed)
Cardiology Office Note Date:  10/21/2018  Patient ID:  Teresa, [name] Oct 26, 1941, MRN [id number] PCP:  Jerrol Banana., MD  Cardiologist:  Dr. Fletcher Anon, MD    Chief Complaint: Hospital follow-up  History of Present Illness: Teresa [name] is a 77 y.o. 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 fib with RVR in the setting of severe myalgias and arthralgias.  Patient previously underwent one-vessel CABG in 1994 with a LIMA to LAD.  In late 2018, she experienced atypical chest discomfort underwent stress testing that showed a fixed apical defect felt to be secondary to breast attenuation.  EF was 42%.  This was followed up by an echocardiogram which confirmed LV dysfunction with an EF of 35 to 40%.  With her prior history of cardiomyopathy and frequent PVCs, she was seen with a plan to set her up for cardiac catheterization and Holter monitoring however when she presented she was in rapid A. fib.  She was subsequently admitted and placed on amiodarone.  She underwent catheterization in 12/2017 which revealed an atretic LIMA to LAD and otherwise nonobstructive disease.  She underwent successful TEE/DCCV during that admission.  She was discharged on amiodarone, however this was subsequently discontinued secondary to nausea and anorexia.  Follow-up Holter monitor in 03/2018 showed frequent PVCs and 7 brief runs of A. fib.  Following this, metoprolol was titrated to 50 mg daily.  Of note, her sinus rate tends to be mildly bradycardic.  Patient recently underwent cervical spine surgery at [facility] on 09/23/2018.  Following this procedure, on 09/25/2018 she developed A. fib with RVR with ventricular rates into the 120s to 140s  bpm.  Her metoprolol was increased to 100 mg twice daily at the recommendation of cardiology at [facility].  Echo on 09/27/2018 showed an EF of 35%, moderate LV and mild RV dysfunction.  She was resumed on Eliquis on 09/26/2018.  Following patient's cervical spine surgery she reported feeling "awful."  She was admitted to [facility] on 10/20 with diffuse severe pain as well as A. fib with RVR with ventricular rates in the 140s bpm.  During her admission she continued to note severe muscle spasms and myalgias which were felt to be contributing significantly to her tachycardic heart rates.  Ultimately, she was rate controlled with Toprol-XL 100 mg twice daily.  She was continued on Eliquis 5 mg twice daily.  Cardioversion was deferred during her admission in the setting of her persistent severe pain it was felt she would be unlikely to maintain sinus rhythm.  It was recommended her pain be adequately addressed with plans for outpatient cardioversion.  She was not volume overloaded during the admission.  Of note, cardiology recommended discontinuation of diltiazem on day of discharge.  Patient's daughter contacted our office on 10/29 questioning why the patient was given a prescription for diltiazem when this medication was not on her discharge summary.  Our office advised the patient's daughter to not have the patient take diltiazem and only take cardiology recommended medications which included Toprol XL 100 mg twice daily as well as Eliquis 5 mg twice daily.  We have sent a message to the internal medicine physician for their input regarding the patient being discharged on diltiazem though this was not  on their discharge summary, medication list, or AVS.  She comes in accompanied by her daughter today.  She denies any palpitations or chest pain.  She does note some bilateral lower extremity swelling, shortness of breath, and orthopnea.  Not weighing herself at home.  Weight is relatively stable today when compared to her  discharge weight, up 2 pounds.  She reports compliance with Eliquis and denies missing any doses.  She has now been on full dose anticoagulation without interruption since 09/26/2018 when resumed at [facility].  She remains on Toprol-XL 100 mg twice daily.  Her pain has significantly improved.  She no longer has upper extremity pain.  She does note some bilateral shoulder pain and is working with physical therapy regarding this.  Per daughter, physical therapy reports the shoulder pain is in the setting of the patient having to wear a cervical collar and holding her head upright.  They have recommended she sit in a recliner to allow her head to rest against the back.  No falls.  No BRBPR or melena.  No dizziness, presyncope, or syncope  Past Medical History:  Diagnosis Date  . Arthritis   . Asthma   . Back pain   . Breast cancer ([place]) 2009   left breast   . CAD (coronary artery disease)    a. 1994 s/p CABG x 1 (LIMA->LAD); b. 03/2015 MV: No ischemia; c. MV 11/18: small fixed apical defect likely secondary to breast attenuation, EF of 42%, frequent PVCs; d. 12/2017 Cath: LM nl, LAD 20p, D1/2/3 nl, LCX min irregs, OM1/2/3 min irregs, RCA nl, RPDA nl, RPL1/2 nl, LIMA->LAD atretic.  Marland Kitchen Dental crowns present    caps- left back top, right back bottom  . Gastroesophageal reflux disease   . Hypertension   . MI (myocardial infarction) ([place]) 1994  . Mixed Ischemic & Nonischemic cardiomyopathy    a. 2013 EF 40%;  b. 03/2015 Echo: EF 55-60%, mild MR, mod dil LA, nl RV fxn; c. 12/18 Echo: EF of 35-40%, hypokinesis of the anteroseptal, and apical myocardium, mild to mod MR, mod dil LA, nl RV fxn, PASP 53 mmHg; d. 12/2017 TEE: EF 35-40%, diff HK, mild to mod MR. small PFO. No LAA/RAA thrombus.  Marland Kitchen PAF (paroxysmal atrial fibrillation) ([place])    a. diagnosed 01/13/2018; b. CHADS2VASc = 6 --> Eliquis; c. 12/2017 s/p TEE/DCCV. Amio started but d/c'd 01/2018 2/2 n/anorexia.  . Personal history of radiation therapy   . Psoriasis     . PSVT (paroxysmal supraventricular tachycardia) ([place])    a. 02/2015 Holter: short runs of SVT and NSVT.  Marland Kitchen Pulmonary hypertension ([place])   . PVC's (premature ventricular contractions)    a. 03/2018 24h Holter: Freq PVC's with a total of 421 beats in 24 hrs. 7 short runs of SVT likely representing Afib.  . Rheumatoid arthritis (HCC)    feet, hands  . Vertigo    approx 2x/yr    Past Surgical History:  Procedure Laterality Date  . ABDOMINAL HYSTERECTOMY    . back fusion    . BREAST BIOPSY Right    1991 negative  . BREAST EXCISIONAL BIOPSY Left    2009 positive  . BREAST LUMPECTOMY    . [place] N/A 01/15/2018   Procedure: CARDIOVERSION;  Surgeon: Wellington Hampshire, MD;  Location: [facility] ORS;  Service: Cardiovascular;  Laterality: N/A;  . CHOLECYSTECTOMY    . CORONARY  ARTERY BYPASS GRAFT  1994   1 vessel - [facility]  . FOOT ARTHRODESIS Right 07/24/2016   Procedure: FUSION FIRST METATARSAL CUNEIFORM JOINT RIGHT FOOT, FUSION SECOND METATARSAL CUNEFORM JOINT BUNION REPAIR RIGHT FOOT;  Surgeon: Albertine Patricia, DPM;  Location: [place];  Service: Podiatry;  Laterality: Right;  . FOOT FRACTURE SURGERY    . HARDWARE REMOVAL Right 07/24/2016   Procedure: REMOVAL HARDWARE LATERAL MALLEOUS RIGHT ANKLE;  Surgeon: Albertine Patricia, DPM;  Location: [place];  Service: Podiatry;  Laterality: Right;  REMOVAL OF PIN WHICH WAS INTACT  . HERNIA REPAIR    . NECK SURGERY    . RIGHT/LEFT HEART CATH AND CORONARY/GRAFT ANGIOGRAPHY N/A 01/14/2018   Procedure: LEFT HEART CATH AND CORONARY ANGIOGRAPHY;  Surgeon: Wellington Hampshire, MD;  Location: [place] CV LAB;  Service: Cardiovascular;  Laterality: N/A;  . TEE WITHOUT CARDIOVERSION N/A 01/15/2018   Procedure: TRANSESOPHAGEAL ECHOCARDIOGRAM (TEE);  Surgeon: Wellington Hampshire, MD;  Location: [facility] ORS;  Service: Cardiovascular;  Laterality: N/A;  . TOTAL HIP ARTHROPLASTY  Left   . TOTAL KNEE ARTHROPLASTY Left     Current Meds  Medication Sig  . acetaminophen (TYLENOL) 325 MG tablet Take 975 mg by mouth every 8 (eight) hours. Three tablets to = 975 mg  . apixaban (ELIQUIS) 5 MG TABS tablet Take 1 tablet (5 mg total) by mouth 2 (two) times daily.  Marland Kitchen Apremilast (OTEZLA) 30 MG TABS Take 30 mg by mouth 2 (two) times daily.  . budesonide-formoterol (SYMBICORT) 160-4.5 MCG/ACT inhaler Inhale 2 puffs into the lungs 2 (two) times daily.  . fluticasone (FLONASE) 50 MCG/ACT nasal spray Place 2 sprays into both nostrils daily as needed for allergies.   Marland Kitchen gabapentin (NEURONTIN) 300 MG capsule Take 600 mg by mouth 3 (three) times daily. Two capsules to = 600 mg  . meclizine (ANTIVERT) 25 MG tablet Take 25 mg by mouth 3 (three) times daily as needed for dizziness.   . methocarbamol (ROBAXIN) 750 MG tablet Take 750 mg by mouth 3 (three) times daily.  . metoprolol succinate (TOPROL XL) 100 MG 24 hr tablet Take 1 tablet (100 mg total) by mouth 2 (two) times daily. Take with or immediately following a meal.  . montelukast (SINGULAIR) 10 MG tablet Take 10 mg by mouth daily as needed (for allergies).   . Multiple Vitamins-Minerals (MULTIVITAMIN PO) Take 1 tablet by mouth daily.  Marland Kitchen oxycodone (OXY-IR) 5 MG capsule Take 1 capsule (5 mg total) by mouth every 4 (four) hours as needed for pain (Take 1/2 to 1 tablet).  . rosuvastatin (CRESTOR) 5 MG tablet TAKE 1 TABLET BY MOUTH EVERY DAY  . sennosides-docusate sodium (SENOKOT-S) 8.6-50 MG tablet Take 2 tablets by mouth 2 (two) times daily.  Marland Kitchen venlafaxine (EFFEXOR) 75 MG tablet Take 2 tablets (150 mg total) by mouth daily.    Allergies:   Sulfa antibiotics; Ivp dye [iodinated diagnostic agents]; Amiodarone; and Codeine   Social History:  The patient  reports that she has quit smoking. Her smoking use included cigarettes. She has a 17.50 pack-year smoking history. She quit smokeless tobacco use about 39 years ago. She reports that she  does not drink alcohol or use drugs.   Family History:  The patient's family history includes Arthritis in her brother, sister, and sister; Bladder Cancer in her brother; Breast cancer in her sister and sister; Breast cancer (age of onset: 65) in her mother; Congestive Heart Failure in her mother; Heart attack in her father; Kidney  failure in her sister; Lung cancer in her brother, sister, and sister; Prostate cancer in her brother.  ROS:   Review of Systems  Constitutional: Positive for malaise/fatigue. Negative for chills, diaphoresis, fever and weight loss.  HENT: Negative for congestion.   Eyes: Negative for discharge and redness.  Respiratory: Positive for shortness of breath. Negative for cough, hemoptysis, sputum production and wheezing.   Cardiovascular: Positive for orthopnea and leg swelling. Negative for chest pain, palpitations, claudication and PND.  Gastrointestinal: Negative for abdominal pain, blood in stool, heartburn, melena, nausea and vomiting.  Genitourinary: Negative for hematuria.  Musculoskeletal: Positive for joint pain and myalgias. Negative for falls.  Skin: Negative for rash.  Neurological: Positive for weakness. Negative for dizziness, tingling, tremors, sensory change, speech change, focal weakness and loss of consciousness.  Endo/Heme/Allergies: Does not bruise/bleed easily.  Psychiatric/Behavioral: Negative for substance abuse. The patient is not nervous/anxious.   All other systems reviewed and are negative.    PHYSICAL EXAM:  VS:  BP 118/68 (BP Location: Right Arm, Patient Position: Sitting, Cuff Size: Normal)   Pulse (!) 112   Ht 5\' 4"  (1.626 m)   Wt 181 lb 4 oz (82.2 kg)   BMI 31.11 kg/m  BMI: Body mass index is 31.11 kg/m.  Physical Exam  Constitutional: She is oriented to person, place, and time. She appears well-developed and well-nourished.  HENT:  Head: Normocephalic and atraumatic.  Eyes: Right eye exhibits no discharge. Left eye exhibits no  discharge.  Neck: Normal range of motion. No JVD present.  Cardiovascular: S1 normal and S2 normal. An irregularly irregular rhythm present. Tachycardia present. Exam reveals no distant heart sounds, no friction rub, no midsystolic click and no opening snap.  Murmur heard. High-pitched blowing holosystolic murmur is present with a grade of 1/6 at the apex. Pulses:      Posterior tibial pulses are 2+ on the right side, and 2+ on the left side.  Pulmonary/Chest: Effort normal. No respiratory distress. She has decreased breath sounds. She has no wheezes. She has no rhonchi. She has rales in the right lower field and the left lower field. She exhibits no tenderness.  Abdominal: Soft. She exhibits no distension. There is no tenderness.  Musculoskeletal: She exhibits edema.  1+ bilateral lower extremity edema to the mid shins  Neurological: She is alert and oriented to person, place, and time.  Skin: Skin is warm and dry. No cyanosis. Nails show no clubbing.  Psychiatric: She has a normal mood and affect. Her speech is normal and behavior is normal. Judgment and thought content normal.     EKG:  Was ordered and interpreted by me today. Shows A. fib with RVR, 112 bpm, left axis deviation, LBBB (known)  Recent Labs: 10/21/2017: ALT 15 01/13/2018: TSH 1.468 10/11/2018: BUN 14; Creatinine, Ser 0.71; Hemoglobin 11.4; Platelets 246; Potassium 4.2; Sodium 139 10/12/2018: Magnesium 1.9  No results found for requested labs within last 8760 hours.   Estimated Creatinine Clearance: 61.1 mL/min (by C-G formula based on SCr of 0.71 mg/dL).   Wt Readings from Last 3 Encounters:  10/21/18 181 lb 4 oz (82.2 kg)  10/13/18 178 lb 12.8 oz (81.1 kg)  10/08/18 173 lb (78.5 kg)     Other studies reviewed: Additional studies/records reviewed today include: summarized above  ASSESSMENT AND PLAN:  1. Persistent A. Fib with RVR: She remains in Afib with ventricular rates mildly tachycardic in the 110s bpm. She  has maxed out her Toprol XL dose at 100 mg  bid and will continue this. Add prn diltiazem 30 mg q 8 hours prn heart rate > 100 bpm. She is intolerant to amiodarone. Digoxin is not ideal as detailed below. Continue Eliquis 5 mg bid. Schedule DCCV the following week as she has now been adequately anticoagulated for > 3 weeks without interruption.   2. Acute on chronic HFrEF secondary to mixed ICM/NICM: She is mildly volume up today, likely in the setting of her Afib with RVR. Add diltiazem for prn usage as above in an effort to adequately control her heart rates. Given her cardiomyopathy, I plan to not use diltiazem long term. However, regarding rate control, we do not have many other options as she has previously been intolerant to amiodarone with severe nausea and given her advanced age, frail state and comorbid conditions I would prefer to avoid digoxin. She will take Lasix 20 mg daily x 4 days followed by repeat bmet to assess renal function. Not currently on ACE/ARB/Entresto/spiro given relative hypotension and need for BP to rate control. Once her Afib is adequately managed would recommend escalated evidence-based heart failure therapy as indicated and able.   3. CAD status post CABG: No symptoms concerning for angina at this time. Remains on Eliquis in place of ASA. Continue Crestor and Toprol XL. Risk factor modification and secondary prevention. No plans for ischemic evaluation at this time.   4. PVCs: Stable. No PVCs noted on EKG. Remains on beta blocker as above.   5. Hypertension: Blood pressure is well controlled today.  Continue Toprol XL 100 mg bid with the addition of prn diltiazem as above.   6. Hyperlipidemia: LDL of 80 from 06/2017.  Normal liver function in 09/2017. Remains on Crestor.   Disposition: F/u with Dr. Fletcher Anon or an APP in 2 weeks.  Current medicines are reviewed at length with the patient today.  The patient did not have any concerns regarding medicines.  Signed, Christell Faith,  PA-C 10/21/2018 10:33 AM     [facility] [address] [place], [place] [address] [phone]

## 2018-10-21 ENCOUNTER — Ambulatory Visit: Payer: Medicare HMO | Admitting: Physician Assistant

## 2018-10-21 ENCOUNTER — Other Ambulatory Visit: Payer: Self-pay | Admitting: Physician Assistant

## 2018-10-21 ENCOUNTER — Encounter: Payer: Self-pay | Admitting: Physician Assistant

## 2018-10-21 VITALS — BP 118/68 | HR 112 | Ht 64.0 in | Wt 181.2 lb

## 2018-10-21 DIAGNOSIS — I480 Paroxysmal atrial fibrillation: Secondary | ICD-10-CM | POA: Diagnosis not present

## 2018-10-21 DIAGNOSIS — I10 Essential (primary) hypertension: Secondary | ICD-10-CM

## 2018-10-21 DIAGNOSIS — I4819 Other persistent atrial fibrillation: Secondary | ICD-10-CM

## 2018-10-21 DIAGNOSIS — I493 Ventricular premature depolarization: Secondary | ICD-10-CM

## 2018-10-21 DIAGNOSIS — I2510 Atherosclerotic heart disease of native coronary artery without angina pectoris: Secondary | ICD-10-CM

## 2018-10-21 DIAGNOSIS — E782 Mixed hyperlipidemia: Secondary | ICD-10-CM

## 2018-10-21 DIAGNOSIS — I5023 Acute on chronic systolic (congestive) heart failure: Secondary | ICD-10-CM | POA: Diagnosis not present

## 2018-10-21 DIAGNOSIS — Z01818 Encounter for other preprocedural examination: Secondary | ICD-10-CM

## 2018-10-21 MED ORDER — FUROSEMIDE 20 MG PO TABS: 20.0000 mg | ORAL_TABLET | Freq: Every day | ORAL | 0 refills | Status: DC

## 2018-10-21 NOTE — Patient Instructions (Signed)
Medication Instructions:  TAKE Furosemide 20 mg daily for four days only  If you need a refill on your cardiac medications before your next appointment, please call your pharmacy.   Lab work: Your physician recommends that you return for lab work in: BMET, CBC  If you have labs (blood work) drawn today and your tests are completely normal, you will receive your results only by: Marland Kitchen MyChart Message (if you have MyChart) OR . A paper copy in the mail If you have any lab test that is abnormal or we need to change your treatment, we will call you to review the results.  Follow-Up: At [facility], you and your health needs are our priority.  As part of our continuing mission to provide you with exceptional heart care, we have created designated Provider Care Teams.  These Care Teams include your primary Cardiologist (physician) and Advanced Practice Providers (APPs -  Physician Assistants and Nurse Practitioners) who all work together to provide you with the care you need, when you need it. You will need a follow up appointment in 2 weeks. You may see Kathlyn Sacramento, MD or one of the following Advanced Practice Providers on your designated Care Team:   Murray Hodgkins, NP Christell Faith, PA-C . Marrianne Mood, PA-C  Any Other Special Instructions Will Be Listed Below (If Applicable).  Dear Ms. Hurston,  You are scheduled for a Cardioversion on 10/29/18  with Dr.Gollan Please arrive at the [place] [facility] at 6:30 a.m. on the day of your procedure.   DIET: Nothing to eat or drink after midnight except a sip of water with medications (see medication instructions below)  Medication Instructions: Continue your anticoagulant: Eliquis You will need to continue your anticoagulant after your procedure until you  are told by your  Provider that it is safe to stop   Labs: Done today  You must have a responsible person to drive you home and stay in the waiting area during your procedure.  Failure to do so could result in cancellation.  Bring your insurance cards.  *Special Note: Every effort is made to have your procedure done on time. Occasionally there are emergencies that occur at the hospital that may cause delays. Please be patient if a delay does occur.

## 2018-10-22 LAB — CBC
Hematocrit: 38.7 % (ref 34.0–46.6)
Hemoglobin: 12.3 g/dL (ref 11.1–15.9)
MCH: 29.6 pg (ref 26.6–33.0)
MCHC: 31.8 g/dL (ref 31.5–35.7)
MCV: 93 fL (ref 79–97)
Platelets: 370 10*3/uL (ref 150–450)
RBC: 4.15 x10E6/uL (ref 3.77–5.28)
RDW: 13.1 % (ref 12.3–15.4)
WBC: 6.1 10*3/uL (ref 3.4–10.8)

## 2018-10-22 LAB — BASIC METABOLIC PANEL
BUN/Creatinine Ratio: 18 (ref 12–28)
BUN: 14 mg/dL (ref 8–27)
CO2: 21 mmol/L (ref 20–29)
Calcium: 9.1 mg/dL (ref 8.7–10.3)
Chloride: 105 mmol/L (ref 96–106)
Creatinine, Ser: 0.76 mg/dL (ref 0.57–1.00)
GFR calc Af Amer: 88 mL/min/{1.73_m2} (ref >59)
GFR calc non Af Amer: 76 mL/min/{1.73_m2} (ref >59)
Glucose: 131 mg/dL — ABNORMAL HIGH (ref 65–99)
Potassium: 4.4 mmol/L (ref 3.5–5.2)
Sodium: 140 mmol/L (ref 134–144)

## 2018-10-25 ENCOUNTER — Telehealth: Payer: Self-pay | Admitting: Family Medicine

## 2018-10-25 ENCOUNTER — Ambulatory Visit: Payer: Medicare HMO | Admitting: Physician Assistant

## 2018-10-25 NOTE — Telephone Encounter (Signed)
Do you want to do these orders for him?

## 2018-10-25 NOTE — Telephone Encounter (Signed)
Rowe Clack, OT w/ [facility] [phone]  Has tried to get verbal orders thru Dr. Mckinley Jewel [facility]-El-Barr for pt with no reply.  Asking if [name] will be ok to give verbals to see pt for OT home health.  Verbal Orders: 2 times a week for 2 weeks For fine and gross moter skills   Please advise.  Thanks, [employer]

## 2018-10-25 NOTE — Telephone Encounter (Signed)
ok 

## 2018-10-25 NOTE — Telephone Encounter (Signed)
Advised 

## 2018-10-27 ENCOUNTER — Ambulatory Visit (INDEPENDENT_AMBULATORY_CARE_PROVIDER_SITE_OTHER): Payer: Medicare HMO | Admitting: Family Medicine

## 2018-10-27 ENCOUNTER — Telehealth: Payer: Self-pay | Admitting: Family Medicine

## 2018-10-27 ENCOUNTER — Encounter: Payer: Self-pay | Admitting: Family Medicine

## 2018-10-27 VITALS — BP 128/70 | HR 102 | Temp 97.9°F | Resp 16 | Wt 180.0 lb

## 2018-10-27 DIAGNOSIS — I255 Ischemic cardiomyopathy: Secondary | ICD-10-CM | POA: Diagnosis not present

## 2018-10-27 DIAGNOSIS — M5090 Cervical disc disorder, unspecified, unspecified cervical region: Secondary | ICD-10-CM | POA: Diagnosis not present

## 2018-10-27 DIAGNOSIS — I4891 Unspecified atrial fibrillation: Secondary | ICD-10-CM | POA: Diagnosis not present

## 2018-10-27 DIAGNOSIS — Z951 Presence of aortocoronary bypass graft: Secondary | ICD-10-CM

## 2018-10-27 NOTE — Progress Notes (Signed)
Patient: Teresa [name] Female    DOB: 1941/11/23   77 y.o.   MRN: [id number] Visit Date: 10/27/2018  Today's Provider: [name], MD   Chief Complaint  Patient presents with  . Hospitalization Follow-up   Subjective:    HPI  Follow up Hospitalization  Patient was admitted to [facility] on 10/10/18 and discharged on 10/13/18. She was treated for a-fib,tachycardia . Treatment for this included starting Metoprolol 100mg  daily. Telephone follow up was done on 10/14/18 She reports good compliance with treatment. She reports this condition is Unchanged.  Patient reports that she has a cardioversion scheduled with Dr. Rockey Situ this Friday (in 2 days).     Allergies  Allergen Reactions  . Sulfa Antibiotics Hives  . Ivp Dye [Iodinated Diagnostic Agents] Swelling  . Amiodarone Nausea And Vomiting and Cough  . Codeine Other (See Comments)    ALTERED MENTAL STATUS     Current Outpatient Medications:  .  acetaminophen (TYLENOL) 325 MG tablet, Take 975 mg by mouth every 8 (eight) hours. Three tablets to = 975 mg, Disp: , Rfl:  .  apixaban (ELIQUIS) 5 MG TABS tablet, Take 1 tablet (5 mg total) by mouth 2 (two) times daily., Disp: 180 tablet, Rfl: 4 .  Apremilast (OTEZLA) 30 MG TABS, Take 30 mg by mouth 2 (two) times daily., Disp: , Rfl:  .  diltiazem (CARDIZEM) 30 MG tablet, Take 30 mg by mouth 3 (three) times daily as needed (For a heart rate greater than 100)., Disp: , Rfl:  .  fluticasone (FLONASE) 50 MCG/ACT nasal spray, Place 2 sprays into both nostrils daily as needed for allergies. , Disp: , Rfl:  .  gabapentin (NEURONTIN) 300 MG capsule, Take 600 mg by mouth 3 (three) times daily. , Disp: , Rfl:  .  meclizine (ANTIVERT) 25 MG tablet, Take 25 mg by mouth 3 (three) times daily as needed for dizziness. , Disp: , Rfl: 1 .  methocarbamol (ROBAXIN) 750 MG tablet, Take 750 mg by mouth 3 (three) times daily., Disp: , Rfl:  .  metoprolol succinate (TOPROL XL) 100 MG 24 hr  tablet, Take 1 tablet (100 mg total) by mouth 2 (two) times daily. Take with or immediately following a meal., Disp: 60 tablet, Rfl: 0 .  mometasone-formoterol (DULERA) 200-5 MCG/ACT AERO, Inhale 2 puffs into the lungs 2 (two) times daily as needed for wheezing or shortness of breath., Disp: , Rfl:  .  montelukast (SINGULAIR) 10 MG tablet, Take 10 mg by mouth daily as needed (for allergies). , Disp: , Rfl: 12 .  Multiple Vitamins-Minerals (MULTIVITAMIN PO), Take 1 tablet by mouth daily., Disp: , Rfl:  .  oxycodone (OXY-IR) 5 MG capsule, Take 1 capsule (5 mg total) by mouth every 4 (four) hours as needed for pain (Take 1/2 to 1 tablet). (Patient taking differently: Take 2.5-5 mg by mouth every 4 (four) hours as needed for pain. ), Disp: 30 capsule, Rfl: 0 .  Polyethyl Glycol-Propyl Glycol (SYSTANE OP), Place 1 drop into both eyes daily as needed (dry eyes)., Disp: , Rfl:  .  rosuvastatin (CRESTOR) 5 MG tablet, TAKE 1 TABLET BY MOUTH EVERY DAY, Disp: 90 tablet, Rfl: 2 .  sennosides-docusate sodium (SENOKOT-S) 8.6-50 MG tablet, Take 1 tablet by mouth daily as needed for constipation. , Disp: , Rfl:  .  venlafaxine (EFFEXOR) 75 MG tablet, Take 2 tablets (150 mg total) by mouth daily. (Patient taking differently: Take 75 mg by mouth 2 (two)  times daily. ), Disp: 180 tablet, Rfl: 3 .  budesonide-formoterol (SYMBICORT) 160-4.5 MCG/ACT inhaler, Inhale 2 puffs into the lungs 2 (two) times daily. (Patient not taking: Reported on 10/26/2018), Disp: 1 Inhaler, Rfl: 3 .  furosemide (LASIX) 20 MG tablet, Take 1 tablet (20 mg total) by mouth daily. Take for four days only. (Patient not taking: Reported on 10/26/2018), Disp: 4 tablet, Rfl: 0  Review of Systems  Constitutional: Negative for activity change, appetite change, diaphoresis, fatigue and fever.  HENT: Negative.   Eyes: Negative.   Respiratory: Negative for cough and shortness of breath.   Cardiovascular: Positive for palpitations. Negative for chest pain  and leg swelling.  Gastrointestinal: Negative.   Endocrine: Negative.   Musculoskeletal: Positive for arthralgias, myalgias, neck pain and neck stiffness.  Skin: Negative.   Neurological: Negative for dizziness, tremors, weakness, light-headedness and headaches.  Psychiatric/Behavioral: Negative.     Social History   Tobacco Use  . Smoking status: Former Smoker    Packs/day: 0.50    Years: 35.00    Pack years: 17.50    Types: Cigarettes  . Smokeless tobacco: Former Systems developer    Quit date: 12/22/1978  . Tobacco comment: Quit approx 1990  Substance Use Topics  . Alcohol use: No   Objective:   BP 128/70   Pulse (!) 102 Comment: irregular  Temp 97.9 F (36.6 C)   Resp 16   Wt 180 lb (81.6 kg)   SpO2 98%   BMI 30.90 kg/m  Vitals:   10/27/18 1509  BP: 128/70  Pulse: (!) 102  Resp: 16  Temp: 97.9 F (36.6 C)  SpO2: 98%  Weight: 180 lb (81.6 kg)     Physical Exam  Constitutional: She is oriented to person, place, and time. She appears well-developed and well-nourished.  HENT:  Head: Normocephalic and atraumatic.  Right Ear: External ear normal.  Left Ear: External ear normal.  Nose: Nose normal.  Eyes: Conjunctivae are normal. No scleral icterus.  Neck: No thyromegaly present.  Cardiovascular: Normal rate, regular rhythm and normal heart sounds.  Pulmonary/Chest: Effort normal and breath sounds normal.  Abdominal: Soft.  Musculoskeletal: She exhibits no edema.  Neurological: She is alert and oriented to person, place, and time.  Skin: Skin is warm and dry.  Psychiatric: She has a normal mood and affect. Her behavior is normal. Judgment and thought content normal.        Assessment & Plan:     1. Atrial fibrillation with rapid ventricular response (HCC) In Afib today  2. Ischemic cardiomyopathy   3. S/P CABG (coronary artery bypass graft)   4. Cervical disc disease S./p Laminectomy recently.      I have done the exam and reviewed the above chart and it  is accurate to the best of my knowledge. Development worker, community has been used in this note in any air is in the dictation or transcription are unintentional.  [name], MD  [place]

## 2018-10-27 NOTE — Telephone Encounter (Signed)
Rowe Clack, OT w/ [facility] - [phone]  Pt will be missing Friday's visit due to pt has an appt at [facility] to order 1 week 1 to make up for this Friday's missed visit.  Add: Medical taping for pt's shoulders to reduce neck strain.  Please advise.  Thanks, [employer]

## 2018-10-27 NOTE — Telephone Encounter (Signed)
Esmirna Ravan as below.

## 2018-10-28 ENCOUNTER — Encounter: Payer: Self-pay | Admitting: Anesthesiology

## 2018-10-29 ENCOUNTER — Ambulatory Visit: Payer: Medicare HMO | Admitting: Anesthesiology

## 2018-10-29 ENCOUNTER — Encounter
Admission: RE | Disposition: A | Discharge status: Home / Self Care | Payer: Self-pay | Source: Ambulatory Visit | Attending: Cardiovascular Disease

## 2018-10-29 ENCOUNTER — Encounter: Payer: Self-pay | Admitting: Emergency Medicine

## 2018-10-29 ENCOUNTER — Ambulatory Visit
Admission: RE | Admit: 2018-10-29 | Discharge: 2018-10-29 | Disposition: A | Discharge status: Home / Self Care | Payer: Medicare HMO | Source: Ambulatory Visit | Attending: Cardiovascular Disease | Admitting: Cardiovascular Disease

## 2018-10-29 DIAGNOSIS — I4819 Other persistent atrial fibrillation: Secondary | ICD-10-CM | POA: Diagnosis not present

## 2018-10-29 DIAGNOSIS — I10 Essential (primary) hypertension: Secondary | ICD-10-CM | POA: Diagnosis not present

## 2018-10-29 DIAGNOSIS — E785 Hyperlipidemia, unspecified: Secondary | ICD-10-CM | POA: Insufficient documentation

## 2018-10-29 DIAGNOSIS — Z87891 Personal history of nicotine dependence: Secondary | ICD-10-CM | POA: Insufficient documentation

## 2018-10-29 DIAGNOSIS — I5023 Acute on chronic systolic (congestive) heart failure: Secondary | ICD-10-CM | POA: Insufficient documentation

## 2018-10-29 DIAGNOSIS — Z96652 Presence of left artificial knee joint: Secondary | ICD-10-CM | POA: Insufficient documentation

## 2018-10-29 DIAGNOSIS — J45909 Unspecified asthma, uncomplicated: Secondary | ICD-10-CM | POA: Diagnosis not present

## 2018-10-29 DIAGNOSIS — Z923 Personal history of irradiation: Secondary | ICD-10-CM | POA: Insufficient documentation

## 2018-10-29 DIAGNOSIS — Z79899 Other long term (current) drug therapy: Secondary | ICD-10-CM | POA: Insufficient documentation

## 2018-10-29 DIAGNOSIS — I252 Old myocardial infarction: Secondary | ICD-10-CM | POA: Insufficient documentation

## 2018-10-29 DIAGNOSIS — Z8249 Family history of ischemic heart disease and other diseases of the circulatory system: Secondary | ICD-10-CM | POA: Diagnosis not present

## 2018-10-29 DIAGNOSIS — I493 Ventricular premature depolarization: Secondary | ICD-10-CM | POA: Diagnosis not present

## 2018-10-29 DIAGNOSIS — Z96642 Presence of left artificial hip joint: Secondary | ICD-10-CM | POA: Insufficient documentation

## 2018-10-29 DIAGNOSIS — I272 Pulmonary hypertension, unspecified: Secondary | ICD-10-CM | POA: Diagnosis not present

## 2018-10-29 DIAGNOSIS — I4891 Unspecified atrial fibrillation: Secondary | ICD-10-CM | POA: Diagnosis not present

## 2018-10-29 DIAGNOSIS — I25119 Atherosclerotic heart disease of native coronary artery with unspecified angina pectoris: Secondary | ICD-10-CM | POA: Diagnosis not present

## 2018-10-29 DIAGNOSIS — Z951 Presence of aortocoronary bypass graft: Secondary | ICD-10-CM | POA: Diagnosis not present

## 2018-10-29 DIAGNOSIS — I255 Ischemic cardiomyopathy: Secondary | ICD-10-CM | POA: Diagnosis not present

## 2018-10-29 DIAGNOSIS — M069 Rheumatoid arthritis, unspecified: Secondary | ICD-10-CM | POA: Diagnosis not present

## 2018-10-29 DIAGNOSIS — I471 Supraventricular tachycardia: Secondary | ICD-10-CM | POA: Diagnosis not present

## 2018-10-29 DIAGNOSIS — I428 Other cardiomyopathies: Secondary | ICD-10-CM | POA: Insufficient documentation

## 2018-10-29 DIAGNOSIS — I110 Hypertensive heart disease with heart failure: Secondary | ICD-10-CM | POA: Diagnosis not present

## 2018-10-29 DIAGNOSIS — Z7901 Long term (current) use of anticoagulants: Secondary | ICD-10-CM | POA: Insufficient documentation

## 2018-10-29 DIAGNOSIS — Z853 Personal history of malignant neoplasm of breast: Secondary | ICD-10-CM | POA: Diagnosis not present

## 2018-10-29 HISTORY — PX: CARDIOVERSION: EP1203

## 2018-10-29 SURGERY — CARDIOVERSION (CATH LAB)
Anesthesia: General

## 2018-10-29 MED ORDER — SODIUM CHLORIDE 0.9 % IV SOLN
INTRAVENOUS | Status: DC | PRN
  Administered 2018-10-29: 07:00:00 via INTRAVENOUS

## 2018-10-29 MED ORDER — PROPOFOL 10 MG/ML IV BOLUS
INTRAVENOUS | Status: DC | PRN
  Administered 2018-10-29: 40 mg via INTRAVENOUS

## 2018-10-29 NOTE — Anesthesia Preprocedure Evaluation (Addendum)
Anesthesia Evaluation  Patient identified by MRN, date of birth, ID band Patient awake    Reviewed: Allergy & Precautions, NPO status , Patient's Chart, lab work & pertinent test results, reviewed documented beta blocker date and time   Airway Mallampati: III  TM Distance: >3 FB     Dental  (+) Chipped   Pulmonary asthma , former smoker,           Cardiovascular hypertension, Pt. on medications and Pt. on home beta blockers + angina + CAD, + Past MI and + CABG  + dysrhythmias Atrial Fibrillation      Neuro/Psych  Neuromuscular disease    GI/Hepatic   Endo/Other    Renal/GU      Musculoskeletal  (+) Arthritis , Rheumatoid disorders,    Abdominal   Peds  Hematology   Anesthesia Other Findings Hx of PVCs. Hx of SVT. Neck movement restricted. EF 40 about 6 yrs ago. No stents. No chest pains.  Reproductive/Obstetrics                            Anesthesia Physical Anesthesia Plan  ASA: III  Anesthesia Plan: General   Post-op Pain Management:    Induction: Intravenous  PONV Risk Score and Plan:   Airway Management Planned:   Additional Equipment:   Intra-op Plan:   Post-operative Plan:   Informed Consent: I have reviewed the patients History and Physical, chart, labs and discussed the procedure including the risks, benefits and alternatives for the proposed anesthesia with the patient or authorized representative who has indicated his/her understanding and acceptance.     Plan Discussed with: CRNA  Anesthesia Plan Comments:         Anesthesia Quick Evaluation

## 2018-10-29 NOTE — H&P (Signed)
H&P Addendum, pre-cardioversion  Patient was seen and evaluated prior to -cardioversion procedure Symptoms, prior testing details again confirmed with the patient Patient examined, no significant change from prior exam Lab work reviewed in detail personally by myself Patient understands risk and benefit of the procedure, willing to proceed  Signed, Tim Gollan, MD, Ph.D [facility]  

## 2018-10-29 NOTE — Anesthesia Postprocedure Evaluation (Signed)
Anesthesia Post Note  Patient: Teresa [name]  Procedure(s) Performed: CARDIOVERSION (N/A )  Patient location during evaluation: Cath Lab Anesthesia Type: General Level of consciousness: awake and alert Pain management: pain level controlled Vital Signs Assessment: post-procedure vital signs reviewed and stable Respiratory status: spontaneous breathing, nonlabored ventilation, respiratory function stable and patient connected to nasal cannula oxygen Cardiovascular status: blood pressure returned to baseline and stable Postop Assessment: no apparent nausea or vomiting Anesthetic complications: no     Last Vitals:  Vitals:   10/29/18 0800 10/29/18 0815  BP: (!) 100/53 (!) 115/51  Pulse: (!) 44 (!) 55  Resp: 13 20  Temp:    SpO2: 96% 95%    Last Pain:  Vitals:   10/29/18 0800  TempSrc:   PainSc: 0-No pain                 Zeidy Tayag S

## 2018-10-29 NOTE — Anesthesia Procedure Notes (Signed)
Date/Time: 10/29/2018 7:14 AM Performed by: Nelda Marseille, CRNA Pre-anesthesia Checklist: Patient identified, Emergency Drugs available, Suction available, Patient being monitored and Timeout performed Oxygen Delivery Method: Nasal cannula

## 2018-10-29 NOTE — Transfer of Care (Signed)
Immediate Anesthesia Transfer of Care Note  Patient: Teresa [name]  Procedure(s) Performed: CARDIOVERSION (N/A )  Patient Location: PACU  Anesthesia Type:General  Level of Consciousness: sedated  Airway & Oxygen Therapy: Patient Spontanous Breathing and Patient connected to nasal cannula oxygen  Post-op Assessment: Report given to RN and Post -op Vital signs reviewed and stable  Post vital signs: Reviewed and stable  Last Vitals:  Vitals Value Taken Time  BP 119/65 10/29/2018  7:45 AM  Temp    Pulse 52 10/29/2018  7:46 AM  Resp 19 10/29/2018  7:46 AM  SpO2 92 % 10/29/2018  7:46 AM  Vitals shown include unvalidated device data.  Last Pain:  Vitals:   10/29/18 0657  TempSrc: Oral  PainSc: 8          Complications: No apparent anesthesia complications

## 2018-10-29 NOTE — Anesthesia Post-op Follow-up Note (Signed)
Anesthesia QCDR form completed.        

## 2018-10-29 NOTE — CV Procedure (Signed)
Cardioversion procedure note For atrial fibrillation, persistant  Procedure Details:  Consent: Risks of procedure as well as the alternatives and risks of each were explained to the (patient/caregiver). Consent for procedure obtained.  Time Out: Verified patient identification, verified procedure, site/side was marked, verified correct patient position, special equipment/implants available, medications/allergies/relevent history reviewed, required imaging and test results available. Performed  Patient placed on cardiac monitor, pulse oximetry, supplemental oxygen as necessary.  Sedation given: propofol IV, Dr. Marcello Moores Pacer pads placed anterior and posterior chest.   Cardioverted 1 time(s).  Cardioverted at 150  J. Synchronized biphasic Converted to NSR   Evaluation: Findings: Post procedure EKG shows: NSR Complications: None Patient did tolerate procedure well.  Time Spent Directly with the Patient:  70 minutes   Esmond Plants, M.D., Ph.D.

## 2018-10-29 NOTE — Discharge Instructions (Signed)
Electrical Cardioversion, Care After °This sheet gives you information about how to care for yourself after your procedure. Your health care provider may also give you more specific instructions. If you have problems or questions, contact your health care provider. °What can I expect after the procedure? °After the procedure, it is common to have: °· Some redness on the skin where the shocks were given. ° °Follow these instructions at home: °· Do not drive for 24 hours if you were given a medicine to help you relax (sedative). °· Take over-the-counter and prescription medicines only as told by your health care provider. °· Ask your health care provider how to check your pulse. Check it often. °· Rest for 48 hours after the procedure or as told by your health care provider. °· Avoid or limit your caffeine use as told by your health care provider. °Contact a health care provider if: °· You feel like your heart is beating too quickly or your pulse is not regular. °· You have a serious muscle cramp that does not go away. °Get help right away if: °· You have discomfort in your chest. °· You are dizzy or you feel faint. °· You have trouble breathing or you are short of breath. °· Your speech is slurred. °· You have trouble moving an arm or leg on one side of your body. °· Your fingers or toes turn cold or blue. °This information is not intended to replace advice given to you by your health care provider. Make sure you discuss any questions you have with your health care provider. °Document Released: 09/28/2013 Document Revised: 07/11/2016 Document Reviewed: 06/13/2016 °Elsevier Interactive Patient Education © 2018 Else[employer] Inc[employer] °General Anesthesia, Adult, Care After °These instructions provide you with information about caring for yourself after your procedure. Your health care provider may also give you more specific instructions. Your treatment has been planned according to current medical practices, but problems  sometimes occur. Call your health care provider if you have any problems or questions after your procedure. °What can I expect after the procedure? °After the procedure, it is common to have: °· Vomiting. °· A sore throat. °· Mental slowness. ° °It is common to feel: °· Nauseous. °· Cold or shivery. °· Sleepy. °· Tired. °· Sore or achy, even in parts of your body where you did not have surgery. ° °Follow these instructions at home: °For at least 24 hours after the procedure: °· Do not: °? Participate in activities where you could fall or become injured. °? Drive. °? Use heavy machinery. °? Drink alcohol. °? Take sleeping pills or medicines that cause drowsiness. °? Make important decisions or sign legal documents. °? Take care of children on your own. °· Rest. °Eating and drinking °· If you vomit, drink water, juice, or soup when you can drink without vomiting. °· Drink enough fluid to keep your urine clear or pale yellow. °· Make sure you have little or no nausea before eating solid foods. °· Follow the diet recommended by your health care provider. °General instructions °· Have a responsible adult stay with you until you are awake and alert. °· Return to your normal activities as told by your health care provider. Ask your health care provider what activities are safe for you. °· Take over-the-counter and prescription medicines only as told by your health care provider. °· If you smoke, do not smoke without supervision. °· Keep all follow-up visits as told by your health care provider. This is important. °Contact a   health care provider if: °· You continue to have nausea or vomiting at home, and medicines are not helpful. °· You cannot drink fluids or start eating again. °· You cannot urinate after 8-12 hours. °· You develop a skin rash. °· You have fever. °· You have increasing redness at the site of your procedure. °Get help right away if: °· You have difficulty breathing. °· You have chest pain. °· You have  unexpected bleeding. °· You feel that you are having a life-threatening or urgent problem. °This information is not intended to replace advice given to you by your health care provider. Make sure you discuss any questions you have with your health care provider. °Document Released: 03/16/2001 Document Revised: 05/12/2016 Document Reviewed: 11/22/2015 °Elsevier Interactive Patient Education © 2018 Elsevier Inc. ° °

## 2018-11-01 ENCOUNTER — Telehealth: Payer: Self-pay | Admitting: Cardiovascular Disease

## 2018-11-01 ENCOUNTER — Encounter: Payer: Self-pay | Admitting: Cardiovascular Disease

## 2018-11-01 MED ORDER — ROSUVASTATIN CALCIUM 5 MG PO TABS: 5.0000 mg | ORAL_TABLET | Freq: Every day | ORAL | 2 refills | Status: DC

## 2018-11-01 NOTE — Telephone Encounter (Signed)
The heart rate is normal.  That is good news.

## 2018-11-01 NOTE — Telephone Encounter (Signed)
Arida patient I will forward

## 2018-11-01 NOTE — Telephone Encounter (Signed)
°*  STAT* If patient is at the pharmacy, call can be transferred to refill team.   1. Which medications need to be refilled? (please list name of each medication and dose if known)  Rosuvastatin (CRESTOR) 5 MG - 1 tablet daily  2. Which pharmacy/location (including street and city if local pharmacy) is medication to be sent to? [facility] in [place]  3. Do they need a 30 day or 90 day supply? 90 day

## 2018-11-01 NOTE — Telephone Encounter (Signed)
Cardioversion was 10/29/18 by Dr Rockey Situ. Routing to him for review.

## 2018-11-01 NOTE — Telephone Encounter (Signed)
STAT if HR is under 50 or over 120 (normal HR is 60-100 beats per minute)  S/P CARDIOVERSION WAS TOLD TO REPORT   1) What is your heart rate? 58  2) Do you have a log of your heart rate readings (document readings)?       AM   NOON  PM   11/8  50   57  63   11/9  61   60  63   11/10  57   57  57   11/11  58   3) Do you have any other symptoms? NO

## 2018-11-01 NOTE — Telephone Encounter (Signed)
Refill sent to pharmacy.   

## 2018-11-02 DIAGNOSIS — M4326 Fusion of spine, lumbar region: Secondary | ICD-10-CM | POA: Diagnosis not present

## 2018-11-02 DIAGNOSIS — M4322 Fusion of spine, cervical region: Secondary | ICD-10-CM | POA: Diagnosis not present

## 2018-11-02 DIAGNOSIS — I25119 Atherosclerotic heart disease of native coronary artery with unspecified angina pectoris: Secondary | ICD-10-CM | POA: Diagnosis not present

## 2018-11-02 DIAGNOSIS — I4891 Unspecified atrial fibrillation: Secondary | ICD-10-CM | POA: Diagnosis not present

## 2018-11-02 DIAGNOSIS — L409 Psoriasis, unspecified: Secondary | ICD-10-CM | POA: Diagnosis not present

## 2018-11-02 DIAGNOSIS — Z4789 Encounter for other orthopedic aftercare: Secondary | ICD-10-CM | POA: Diagnosis not present

## 2018-11-02 DIAGNOSIS — K219 Gastro-esophageal reflux disease without esophagitis: Secondary | ICD-10-CM | POA: Diagnosis not present

## 2018-11-02 DIAGNOSIS — Z981 Arthrodesis status: Secondary | ICD-10-CM | POA: Diagnosis not present

## 2018-11-02 DIAGNOSIS — I255 Ischemic cardiomyopathy: Secondary | ICD-10-CM | POA: Diagnosis not present

## 2018-11-02 DIAGNOSIS — G894 Chronic pain syndrome: Secondary | ICD-10-CM | POA: Diagnosis not present

## 2018-11-02 NOTE — Telephone Encounter (Signed)
Patient calling back and she verbalized understanding that numbers are good. She was appreciative.

## 2018-11-02 NOTE — Telephone Encounter (Signed)
No answer. Left detail message with recommendations, ok per DPR, and to call back if any questions.  

## 2018-11-04 ENCOUNTER — Telehealth: Payer: Self-pay

## 2018-11-04 DIAGNOSIS — L409 Psoriasis, unspecified: Secondary | ICD-10-CM | POA: Diagnosis not present

## 2018-11-04 DIAGNOSIS — M4326 Fusion of spine, lumbar region: Secondary | ICD-10-CM | POA: Diagnosis not present

## 2018-11-04 DIAGNOSIS — Z981 Arthrodesis status: Secondary | ICD-10-CM | POA: Diagnosis not present

## 2018-11-04 DIAGNOSIS — Z4789 Encounter for other orthopedic aftercare: Secondary | ICD-10-CM | POA: Diagnosis not present

## 2018-11-04 DIAGNOSIS — K219 Gastro-esophageal reflux disease without esophagitis: Secondary | ICD-10-CM | POA: Diagnosis not present

## 2018-11-04 DIAGNOSIS — I255 Ischemic cardiomyopathy: Secondary | ICD-10-CM | POA: Diagnosis not present

## 2018-11-04 DIAGNOSIS — I4891 Unspecified atrial fibrillation: Secondary | ICD-10-CM | POA: Diagnosis not present

## 2018-11-04 DIAGNOSIS — G894 Chronic pain syndrome: Secondary | ICD-10-CM | POA: Diagnosis not present

## 2018-11-04 DIAGNOSIS — I25119 Atherosclerotic heart disease of native coronary artery with unspecified angina pectoris: Secondary | ICD-10-CM | POA: Diagnosis not present

## 2018-11-04 DIAGNOSIS — M4322 Fusion of spine, cervical region: Secondary | ICD-10-CM | POA: Diagnosis not present

## 2018-11-04 NOTE — Telephone Encounter (Signed)
Occupational Therapist Izora Gala with [facility] called to give an update, and to request a verbal order.She states patient is in significant pain, but gets some relief with soft tissue manipulation. Izora Gala needs a verbal order for this because it was not in the initial order. Please advise.

## 2018-11-04 NOTE — Telephone Encounter (Signed)
ok 

## 2018-11-05 ENCOUNTER — Ambulatory Visit: Payer: Medicare HMO | Admitting: Physician Assistant

## 2018-11-05 ENCOUNTER — Telehealth: Payer: Self-pay | Admitting: Family Medicine

## 2018-11-05 NOTE — Telephone Encounter (Signed)
Pt returned missed call.  Please call pt back if needed.  Thanks, [employer]

## 2018-11-07 NOTE — Progress Notes (Deleted)
Cardiology Office Note Date:  11/07/2018  Patient ID:  Teresa [name], Teresa [name] 1941/09/02, MRN [id number] PCP:  Jerrol Banana., MD  Cardiologist:  Dr. Fletcher Anon, MD  ***refresh   Chief Complaint: Follow up  History of Present Illness: Teresa [name] is a 77 y.o. female with history of CAD status post CABG x 1,chronic systolic CHF secondary to mixed ICM/NICM, PAF s/p successful TEE/DCCV 12/2017 on Eliquis with recurrent Afib, PSVT,hypertension, hyperlipidemia, frequent PVCs,breast cancer,and chronic pain who presents for follow up of her Afib s/p repeat DCCV.   Patient previously underwent one-vessel CABG in 1994 with a LIMA to LAD.  In late 2018, she experienced atypical chest discomfort underwent stress testing that showed a fixed apical defect felt to be secondary to breast attenuation.  EF was 42%.  This was followed up by an echocardiogram which confirmed LV dysfunction with an EF of 35 to 40%.  With her prior history of cardiomyopathy and frequent PVCs, she was seen with a plan to set her up for cardiac catheterization and Holter monitoring however when she presented she was in rapid A. fib.  She was subsequently admitted and placed on amiodarone.  She underwent catheterization in 12/2017 which revealed an atretic LIMA to LAD and otherwise nonobstructive disease.  She underwent successful TEE/DCCV during that admission.  She was discharged on amiodarone, however this was subsequently discontinued secondary to nausea and anorexia.  Follow-up Holter monitor in 03/2018 showed frequent PVCs and 7 brief runs of A. fib.  Following this, metoprolol was titrated to 50 mg daily.  Of note, her sinus rate tends to be mildly bradycardic.  Patient recently underwent cervical spine surgery at [facility] on 09/23/2018.  Following this procedure, on 09/25/2018 she developed A. fib with RVR with ventricular rates into the 120s to 140s bpm.  Her metoprolol was increased to 100 mg twice daily at the recommendation of  cardiology at [facility].  Echo on 09/27/2018 showed an EF of 35%, moderate LV and mild RV dysfunction.  She was resumed on Eliquis on 09/26/2018.  Following patient's cervical spine surgery she reported feeling "awful."  She was admitted to [facility] on 10/20 with diffuse severe pain as well as A. fib with RVR with ventricular rates in the 140s bpm.  During her admission she continued to note severe muscle spasms and myalgias which were felt to be contributing significantly to her tachycardic heart rates.  Ultimately, she was rate controlled with Toprol-XL 100 mg twice daily.  She was continued on Eliquis 5 mg twice daily.  Cardioversion was deferred during her admission in the setting of her persistent severe pain it was felt she would be unlikely to maintain sinus rhythm.  She was seen in hospital follow up on 10/21/2018 and remained in Afib with RVR with ventricular rates in the 110s bpm.  Her pain was improved.  She was mildly volume up with a weight of 181 pounds.  She was gently diuresed and rate control was augmented with short acting diltiazem.  She underwent successful DCCV on 10/29/2018.  Follow up patient reported heart rates were well controlled in the 50s to 60s bpm.   ***   Past Medical History:  Diagnosis Date  . Arthritis   . Asthma   . Back pain   . Breast cancer ([place]) 2009   left breast   . CAD (coronary artery disease)    a. 1994 s/p CABG x 1 (LIMA->LAD); b. 03/2015 MV: No ischemia; c. MV 11/18: small fixed  apical defect likely secondary to breast attenuation, EF of 42%, frequent PVCs; d. 12/2017 Cath: LM nl, LAD 20p, D1/2/3 nl, LCX min irregs, OM1/2/3 min irregs, RCA nl, RPDA nl, RPL1/2 nl, LIMA->LAD atretic.  Marland Kitchen Dental crowns present    caps- left back top, right back bottom  . Gastroesophageal reflux disease   . Hypertension   . MI (myocardial infarction) ([place]) 1994  . Mixed Ischemic & Nonischemic cardiomyopathy    a. 2013 EF 40%;  b. 03/2015 Echo: EF 55-60%, mild MR, mod dil LA, nl RV fxn;  c. 12/18 Echo: EF of 35-40%, hypokinesis of the anteroseptal, and apical myocardium, mild to mod MR, mod dil LA, nl RV fxn, PASP 53 mmHg; d. 12/2017 TEE: EF 35-40%, diff HK, mild to mod MR. small PFO. No LAA/RAA thrombus.  Marland Kitchen PAF (paroxysmal atrial fibrillation) ([place])    a. diagnosed 01/13/2018; b. CHADS2VASc = 6 --> Eliquis; c. 12/2017 s/p TEE/DCCV. Amio started but d/c'd 01/2018 2/2 n/anorexia.  . Personal history of radiation therapy   . Psoriasis   . PSVT (paroxysmal supraventricular tachycardia) ([place])    a. 02/2015 Holter: short runs of SVT and NSVT.  Marland Kitchen Pulmonary hypertension ([place])   . PVC's (premature ventricular contractions)    a. 03/2018 24h Holter: Freq PVC's with a total of 421 beats in 24 hrs. 7 short runs of SVT likely representing Afib.  . Rheumatoid arthritis ([facility])    feet, hands  . Vertigo    approx 2x/yr    Past Surgical History:  Procedure Laterality Date  . ABDOMINAL HYSTERECTOMY    . back fusion    . BREAST BIOPSY Right    1991 negative  . BREAST EXCISIONAL BIOPSY Left    2009 positive  . BREAST LUMPECTOMY    . [place] N/A 01/15/2018   Procedure: CARDIOVERSION;  Surgeon: Wellington Hampshire, MD;  Location: [facility] ORS;  Service: Cardiovascular;  Laterality: N/A;  . CARDIOVERSION N/A 10/29/2018   Procedure: CARDIOVERSION;  Surgeon: Minna Merritts, MD;  Location: [facility] OR[facility];  Service: Cardiovascular;  Laterality: N/A;  . CHOLECYSTECTOMY    . CORONARY ARTERY BYPASS GRAFT  1994   1 vessel - [facility]  . FOOT ARTHRODESIS Right 07/24/2016   Procedure: FUSION FIRST METATARSAL CUNEIFORM JOINT RIGHT FOOT, FUSION SECOND METATARSAL CUNEFORM JOINT BUNION REPAIR RIGHT FOOT;  Surgeon: Albertine Patricia, DPM;  Location: [place];  Service: Podiatry;  Laterality: Right;  . FOOT FRACTURE SURGERY    . HARDWARE REMOVAL Right 07/24/2016   Procedure: REMOVAL HARDWARE LATERAL MALLEOUS RIGHT ANKLE;  Surgeon: Albertine Patricia, DPM;  Location: [place];  Service: Podiatry;  Laterality: Right;  REMOVAL OF PIN WHICH WAS INTACT  . HERNIA REPAIR    . NECK SURGERY    . RIGHT/LEFT HEART CATH AND CORONARY/GRAFT ANGIOGRAPHY N/A 01/14/2018   Procedure: LEFT HEART CATH AND CORONARY ANGIOGRAPHY;  Surgeon: Wellington Hampshire, MD;  Location: [place] [facility];  Service: Cardiovascular;  Laterality: N/A;  . TEE WITHOUT CARDIOVERSION N/A 01/15/2018   Procedure: TRANSESOPHAGEAL ECHOCARDIOGRAM (TEE);  Surgeon: Wellington Hampshire, MD;  Location: [facility];  Service: Cardiovascular;  Laterality: N/A;  . TOTAL HIP ARTHROPLASTY Left   . TOTAL KNEE ARTHROPLASTY Left     No outpatient medications have been marked as taking for the 11/11/18 encounter (Appointment) with Rise Mu, PA-C.    Allergies:   Sulfa antibiotics; Ivp  dye [iodinated diagnostic agents]; Amiodarone; and Codeine   Social History:  The patient  reports that she has quit smoking. Her smoking use included cigarettes. She has a 17.50 pack-year smoking history. She quit smokeless tobacco use about 39 years ago. She reports that she does not drink alcohol or use drugs.   Family History:  The patient's family history includes Arthritis in her brother, sister, and sister; Bladder Cancer in her brother; Breast cancer in her sister and sister; Breast cancer (age of onset: 51) in her mother; Congestive Heart Failure in her mother; Heart attack in her father; Kidney failure in her sister; Lung cancer in her brother, sister, and sister; Prostate cancer in her brother.  ROS:   ROS   PHYSICAL EXAM: *** VS:  There were no vitals taken for this visit. BMI: There is no height or weight on file to calculate BMI.  Physical Exam   EKG:  Was ordered and interpreted by me today. Shows ***  Recent Labs: 01/13/2018: TSH 1.468 10/12/2018: Magnesium 1.9 10/21/2018: BUN 14; Creatinine, Ser 0.76; Hemoglobin 12.3; Platelets 370; Potassium 4.4; Sodium 140  No results  found for requested labs within last 8760 hours.   Estimated Creatinine Clearance: 60.9 mL/min (by C-G formula based on SCr of 0.76 mg/dL).   Wt Readings from Last 3 Encounters:  10/29/18 179 lb 14.3 oz (81.6 kg)  10/27/18 180 lb (81.6 kg)  10/21/18 181 lb 4 oz (82.2 kg)     Other studies reviewed: Additional studies/records reviewed today include: summarized above  ASSESSMENT AND PLAN:  1. ***  Disposition: F/u with Dr. Fletcher Anon or an APP in ***  Current medicines are reviewed at length with the patient today.  The patient did not have any concerns regarding medicines.  Signed, Christell Faith, PA-C 11/07/2018 4:16 PM     [address], [place] [address] [phone]

## 2018-11-08 DIAGNOSIS — Z951 Presence of aortocoronary bypass graft: Secondary | ICD-10-CM | POA: Diagnosis not present

## 2018-11-08 DIAGNOSIS — M50323 Other cervical disc degeneration at C6-C7 level: Secondary | ICD-10-CM | POA: Diagnosis not present

## 2018-11-08 DIAGNOSIS — M4322 Fusion of spine, cervical region: Secondary | ICD-10-CM | POA: Diagnosis not present

## 2018-11-08 DIAGNOSIS — Z981 Arthrodesis status: Secondary | ICD-10-CM | POA: Diagnosis not present

## 2018-11-08 NOTE — Telephone Encounter (Signed)
Advised  ED 

## 2018-11-10 DIAGNOSIS — K219 Gastro-esophageal reflux disease without esophagitis: Secondary | ICD-10-CM | POA: Diagnosis not present

## 2018-11-10 DIAGNOSIS — Z4789 Encounter for other orthopedic aftercare: Secondary | ICD-10-CM | POA: Diagnosis not present

## 2018-11-10 DIAGNOSIS — Z981 Arthrodesis status: Secondary | ICD-10-CM | POA: Diagnosis not present

## 2018-11-10 DIAGNOSIS — I255 Ischemic cardiomyopathy: Secondary | ICD-10-CM | POA: Diagnosis not present

## 2018-11-10 DIAGNOSIS — G894 Chronic pain syndrome: Secondary | ICD-10-CM | POA: Diagnosis not present

## 2018-11-10 DIAGNOSIS — L409 Psoriasis, unspecified: Secondary | ICD-10-CM | POA: Diagnosis not present

## 2018-11-10 DIAGNOSIS — M4322 Fusion of spine, cervical region: Secondary | ICD-10-CM | POA: Diagnosis not present

## 2018-11-10 DIAGNOSIS — I25119 Atherosclerotic heart disease of native coronary artery with unspecified angina pectoris: Secondary | ICD-10-CM | POA: Diagnosis not present

## 2018-11-10 DIAGNOSIS — M4326 Fusion of spine, lumbar region: Secondary | ICD-10-CM | POA: Diagnosis not present

## 2018-11-10 DIAGNOSIS — I4891 Unspecified atrial fibrillation: Secondary | ICD-10-CM | POA: Diagnosis not present

## 2018-11-11 ENCOUNTER — Telehealth: Payer: Self-pay | Admitting: Physician Assistant

## 2018-11-11 ENCOUNTER — Emergency Department: Payer: Medicare HMO

## 2018-11-11 ENCOUNTER — Ambulatory Visit: Payer: Medicare HMO | Admitting: Physician Assistant

## 2018-11-11 ENCOUNTER — Other Ambulatory Visit: Payer: Self-pay

## 2018-11-11 ENCOUNTER — Inpatient Hospital Stay
Admission: EM | Admit: 2018-11-11 | Discharge: 2018-11-12 | DRG: 310 | Disposition: A | Discharge status: Home Health | Payer: Medicare HMO | Attending: Specialist | Admitting: Specialist

## 2018-11-11 DIAGNOSIS — Z981 Arthrodesis status: Secondary | ICD-10-CM

## 2018-11-11 DIAGNOSIS — G629 Polyneuropathy, unspecified: Secondary | ICD-10-CM | POA: Diagnosis present

## 2018-11-11 DIAGNOSIS — I2510 Atherosclerotic heart disease of native coronary artery without angina pectoris: Secondary | ICD-10-CM | POA: Diagnosis not present

## 2018-11-11 DIAGNOSIS — Z888 Allergy status to other drugs, medicaments and biological substances status: Secondary | ICD-10-CM

## 2018-11-11 DIAGNOSIS — Z853 Personal history of malignant neoplasm of breast: Secondary | ICD-10-CM | POA: Diagnosis not present

## 2018-11-11 DIAGNOSIS — I252 Old myocardial infarction: Secondary | ICD-10-CM

## 2018-11-11 DIAGNOSIS — Z96652 Presence of left artificial knee joint: Secondary | ICD-10-CM | POA: Diagnosis present

## 2018-11-11 DIAGNOSIS — I4891 Unspecified atrial fibrillation: Secondary | ICD-10-CM | POA: Diagnosis present

## 2018-11-11 DIAGNOSIS — Z96642 Presence of left artificial hip joint: Secondary | ICD-10-CM | POA: Diagnosis present

## 2018-11-11 DIAGNOSIS — L409 Psoriasis, unspecified: Secondary | ICD-10-CM | POA: Diagnosis present

## 2018-11-11 DIAGNOSIS — I25118 Atherosclerotic heart disease of native coronary artery with other forms of angina pectoris: Secondary | ICD-10-CM

## 2018-11-11 DIAGNOSIS — J45909 Unspecified asthma, uncomplicated: Secondary | ICD-10-CM | POA: Diagnosis present

## 2018-11-11 DIAGNOSIS — I493 Ventricular premature depolarization: Secondary | ICD-10-CM | POA: Diagnosis present

## 2018-11-11 DIAGNOSIS — Z803 Family history of malignant neoplasm of breast: Secondary | ICD-10-CM

## 2018-11-11 DIAGNOSIS — I272 Pulmonary hypertension, unspecified: Secondary | ICD-10-CM | POA: Diagnosis present

## 2018-11-11 DIAGNOSIS — I471 Supraventricular tachycardia: Secondary | ICD-10-CM | POA: Diagnosis present

## 2018-11-11 DIAGNOSIS — R0602 Shortness of breath: Secondary | ICD-10-CM | POA: Diagnosis not present

## 2018-11-11 DIAGNOSIS — K219 Gastro-esophageal reflux disease without esophagitis: Secondary | ICD-10-CM | POA: Diagnosis present

## 2018-11-11 DIAGNOSIS — I10 Essential (primary) hypertension: Secondary | ICD-10-CM | POA: Diagnosis not present

## 2018-11-11 DIAGNOSIS — Z87891 Personal history of nicotine dependence: Secondary | ICD-10-CM

## 2018-11-11 DIAGNOSIS — Z90710 Acquired absence of both cervix and uterus: Secondary | ICD-10-CM

## 2018-11-11 DIAGNOSIS — F329 Major depressive disorder, single episode, unspecified: Secondary | ICD-10-CM | POA: Diagnosis present

## 2018-11-11 DIAGNOSIS — I428 Other cardiomyopathies: Secondary | ICD-10-CM | POA: Diagnosis not present

## 2018-11-11 DIAGNOSIS — Z951 Presence of aortocoronary bypass graft: Secondary | ICD-10-CM | POA: Diagnosis not present

## 2018-11-11 DIAGNOSIS — Z923 Personal history of irradiation: Secondary | ICD-10-CM

## 2018-11-11 DIAGNOSIS — I420 Dilated cardiomyopathy: Secondary | ICD-10-CM

## 2018-11-11 DIAGNOSIS — R9431 Abnormal electrocardiogram [ECG] [EKG]: Secondary | ICD-10-CM | POA: Diagnosis not present

## 2018-11-11 DIAGNOSIS — I4819 Other persistent atrial fibrillation: Secondary | ICD-10-CM | POA: Diagnosis not present

## 2018-11-11 DIAGNOSIS — M069 Rheumatoid arthritis, unspecified: Secondary | ICD-10-CM | POA: Diagnosis present

## 2018-11-11 DIAGNOSIS — M792 Neuralgia and neuritis, unspecified: Secondary | ICD-10-CM | POA: Diagnosis not present

## 2018-11-11 DIAGNOSIS — I255 Ischemic cardiomyopathy: Secondary | ICD-10-CM | POA: Diagnosis not present

## 2018-11-11 DIAGNOSIS — Z79899 Other long term (current) drug therapy: Secondary | ICD-10-CM

## 2018-11-11 DIAGNOSIS — Z8261 Family history of arthritis: Secondary | ICD-10-CM

## 2018-11-11 DIAGNOSIS — Z885 Allergy status to narcotic agent status: Secondary | ICD-10-CM

## 2018-11-11 DIAGNOSIS — M1990 Unspecified osteoarthritis, unspecified site: Secondary | ICD-10-CM | POA: Diagnosis present

## 2018-11-11 DIAGNOSIS — R69 Illness, unspecified: Secondary | ICD-10-CM | POA: Diagnosis not present

## 2018-11-11 DIAGNOSIS — Z8249 Family history of ischemic heart disease and other diseases of the circulatory system: Secondary | ICD-10-CM

## 2018-11-11 DIAGNOSIS — Z882 Allergy status to sulfonamides status: Secondary | ICD-10-CM

## 2018-11-11 DIAGNOSIS — E876 Hypokalemia: Secondary | ICD-10-CM | POA: Diagnosis present

## 2018-11-11 DIAGNOSIS — I429 Cardiomyopathy, unspecified: Secondary | ICD-10-CM | POA: Diagnosis not present

## 2018-11-11 DIAGNOSIS — Z91041 Radiographic dye allergy status: Secondary | ICD-10-CM

## 2018-11-11 HISTORY — PX: CARDIOVERSION: SHX1299

## 2018-11-11 LAB — BASIC METABOLIC PANEL
Anion gap: 11 (ref 5–15)
BUN: 7 mg/dL — ABNORMAL LOW (ref 8–23)
CO2: 25 mmol/L (ref 22–32)
Calcium: 9.1 mg/dL (ref 8.9–10.3)
Chloride: 106 mmol/L (ref 98–111)
Creatinine, Ser: 0.55 mg/dL (ref 0.44–1.00)
GFR calc Af Amer: >60 mL/min (ref >60)
GFR calc non Af Amer: >60 mL/min (ref >60)
Glucose, Bld: 120 mg/dL — ABNORMAL HIGH (ref 70–99)
Potassium: 3.5 mmol/L (ref 3.5–5.1)
Sodium: 142 mmol/L (ref 135–145)

## 2018-11-11 LAB — CBC
HCT: 44.8 % (ref 36.0–46.0)
Hemoglobin: 13.7 g/dL (ref 12.0–15.0)
MCH: 29.1 pg (ref 26.0–34.0)
MCHC: 30.6 g/dL (ref 30.0–36.0)
MCV: 95.1 fL (ref 80.0–100.0)
Platelets: 249 10*3/uL (ref 150–400)
RBC: 4.71 MIL/uL (ref 3.87–5.11)
RDW: 14.6 % (ref 11.5–15.5)
WBC: 6.5 10*3/uL (ref 4.0–10.5)
nRBC: 0 % (ref 0.0–0.2)

## 2018-11-11 MED ORDER — ACETAMINOPHEN 325 MG PO TABS: 975.0000 mg | ORAL_TABLET | Freq: Three times a day (TID) | ORAL | Status: DC

## 2018-11-11 MED ORDER — ADULT MULTIVITAMIN W/MINERALS CH
1.0000 | ORAL_TABLET | Freq: Every day | ORAL | Status: DC
  Administered 2018-11-12: 1 via ORAL
  Filled 2018-11-11: qty 1

## 2018-11-11 MED ORDER — POLYETHYLENE GLYCOL 3350 17 G PO PACK: 17.0000 g | PACK | Freq: Every day | ORAL | Status: DC | PRN

## 2018-11-11 MED ORDER — PROPOFOL 10 MG/ML IV BOLUS
0.5000 mg/kg | Freq: Once | INTRAVENOUS | Status: AC
  Administered 2018-11-11: 40.8 mg via INTRAVENOUS
  Filled 2018-11-11: qty 20

## 2018-11-11 MED ORDER — IBUPROFEN 400 MG PO TABS
400.0000 mg | ORAL_TABLET | ORAL | Status: DC | PRN
  Administered 2018-11-11: 400 mg via ORAL
  Filled 2018-11-11: qty 1

## 2018-11-11 MED ORDER — METHOCARBAMOL 750 MG PO TABS
750.0000 mg | ORAL_TABLET | Freq: Three times a day (TID) | ORAL | Status: DC
  Administered 2018-11-11 – 2018-11-12 (×2): 750 mg via ORAL
  Filled 2018-11-11 (×4): qty 1

## 2018-11-11 MED ORDER — DILTIAZEM HCL-DEXTROSE 100-5 MG/100ML-% IV SOLN (PREMIX): 5.0000 mg/h | INTRAVENOUS | Status: DC

## 2018-11-11 MED ORDER — DILTIAZEM HCL 25 MG/5ML IV SOLN
10.0000 mg | Freq: Once | INTRAVENOUS | Status: AC
  Administered 2018-11-11: 10 mg via INTRAVENOUS
  Filled 2018-11-11: qty 5

## 2018-11-11 MED ORDER — GABAPENTIN 300 MG PO CAPS
600.0000 mg | ORAL_CAPSULE | Freq: Three times a day (TID) | ORAL | Status: DC
  Administered 2018-11-11 – 2018-11-12 (×2): 600 mg via ORAL
  Filled 2018-11-11 (×2): qty 2

## 2018-11-11 MED ORDER — MECLIZINE HCL 25 MG PO TABS
25.0000 mg | ORAL_TABLET | Freq: Three times a day (TID) | ORAL | Status: DC | PRN
  Filled 2018-11-11: qty 1

## 2018-11-11 MED ORDER — APREMILAST 30 MG PO TABS
30.0000 mg | ORAL_TABLET | Freq: Two times a day (BID) | ORAL | Status: DC
  Administered 2018-11-12: 30 mg via ORAL
  Filled 2018-11-11 (×3): qty 1

## 2018-11-11 MED ORDER — METOPROLOL SUCCINATE ER 50 MG PO TB24
ORAL_TABLET | ORAL | Status: AC
  Administered 2018-11-11: 100 mg via ORAL
  Filled 2018-11-11: qty 2

## 2018-11-11 MED ORDER — ROSUVASTATIN CALCIUM 5 MG PO TABS
5.0000 mg | ORAL_TABLET | Freq: Every day | ORAL | Status: DC
  Filled 2018-11-11: qty 1

## 2018-11-11 MED ORDER — MOMETASONE FURO-FORMOTEROL FUM 200-5 MCG/ACT IN AERO
2.0000 | INHALATION_SPRAY | Freq: Two times a day (BID) | RESPIRATORY_TRACT | Status: DC
  Administered 2018-11-11 – 2018-11-12 (×2): 2 via RESPIRATORY_TRACT
  Filled 2018-11-11: qty 8.8

## 2018-11-11 MED ORDER — VENLAFAXINE HCL 37.5 MG PO TABS
75.0000 mg | ORAL_TABLET | Freq: Two times a day (BID) | ORAL | Status: DC
  Administered 2018-11-11 – 2018-11-12 (×2): 75 mg via ORAL
  Filled 2018-11-11 (×3): qty 2

## 2018-11-11 MED ORDER — METOPROLOL SUCCINATE ER 100 MG PO TB24
100.0000 mg | ORAL_TABLET | Freq: Two times a day (BID) | ORAL | Status: DC
  Administered 2018-11-11 – 2018-11-12 (×2): 100 mg via ORAL
  Filled 2018-11-11: qty 1

## 2018-11-11 MED ORDER — APIXABAN 5 MG PO TABS
5.0000 mg | ORAL_TABLET | Freq: Two times a day (BID) | ORAL | Status: DC
  Administered 2018-11-11 – 2018-11-12 (×2): 5 mg via ORAL
  Filled 2018-11-11 (×2): qty 1

## 2018-11-11 MED ORDER — ADENOSINE 6 MG/2ML IV SOLN: 6.0000 mg | Freq: Once | INTRAVENOUS | Status: DC

## 2018-11-11 MED ORDER — ACETAMINOPHEN 650 MG RE SUPP: 650.0000 mg | Freq: Four times a day (QID) | RECTAL | Status: DC | PRN

## 2018-11-11 MED ORDER — ONDANSETRON HCL 4 MG PO TABS: 4.0000 mg | ORAL_TABLET | Freq: Four times a day (QID) | ORAL | Status: DC | PRN

## 2018-11-11 MED ORDER — DILTIAZEM HCL ER COATED BEADS 120 MG PO CP24
240.0000 mg | ORAL_CAPSULE | Freq: Every day | ORAL | Status: DC
Start: 2018-11-11 — End: 2018-11-12
  Administered 2018-11-11 – 2018-11-12 (×2): 240 mg via ORAL
  Filled 2018-11-11: qty 1
  Filled 2018-11-11: qty 2
  Filled 2018-11-11: qty 1

## 2018-11-11 MED ORDER — MONTELUKAST SODIUM 10 MG PO TABS: 10.0000 mg | ORAL_TABLET | Freq: Every day | ORAL | Status: DC | PRN

## 2018-11-11 MED ORDER — ONDANSETRON HCL 4 MG/2ML IJ SOLN: 4.0000 mg | Freq: Four times a day (QID) | INTRAMUSCULAR | Status: DC | PRN

## 2018-11-11 MED ORDER — SENNOSIDES-DOCUSATE SODIUM 8.6-50 MG PO TABS: 1.0000 | ORAL_TABLET | Freq: Every day | ORAL | Status: DC | PRN

## 2018-11-11 MED ORDER — BISACODYL 5 MG PO TBEC: 5.0000 mg | DELAYED_RELEASE_TABLET | Freq: Every day | ORAL | Status: DC | PRN

## 2018-11-11 MED ORDER — DILTIAZEM HCL 25 MG/5ML IV SOLN
10.0000 mg | Freq: Once | INTRAVENOUS | Status: AC
  Administered 2018-11-11: 10 mg via INTRAVENOUS

## 2018-11-11 MED ORDER — DILTIAZEM HCL 60 MG PO TABS: 30.0000 mg | ORAL_TABLET | Freq: Three times a day (TID) | ORAL | Status: DC | PRN

## 2018-11-11 MED ORDER — ACETAMINOPHEN 325 MG PO TABS
650.0000 mg | ORAL_TABLET | Freq: Four times a day (QID) | ORAL | Status: DC | PRN
  Administered 2018-11-11: 650 mg via ORAL
  Filled 2018-11-11: qty 2

## 2018-11-11 MED ORDER — DILTIAZEM HCL 25 MG/5ML IV SOLN: 10.0000 mg | Freq: Once | INTRAVENOUS | Status: DC

## 2018-11-11 MED ORDER — POLYVINYL ALCOHOL 1.4 % OP SOLN
1.0000 [drp] | Freq: Every day | OPHTHALMIC | Status: DC | PRN
  Filled 2018-11-11: qty 15

## 2018-11-11 MED ORDER — FLUTICASONE PROPIONATE 50 MCG/ACT NA SUSP
2.0000 | Freq: Every day | NASAL | Status: DC | PRN
  Filled 2018-11-11: qty 16

## 2018-11-11 NOTE — Sedation Documentation (Signed)
Patients heart rate did not remain in normal sinus after cardioversion X2. MD reports we will not be shocking again. Patients sister and niece at bedside

## 2018-11-11 NOTE — ED Notes (Signed)
Patient denies CP

## 2018-11-11 NOTE — Sedation Documentation (Signed)
Patient shocked 150J at this time

## 2018-11-11 NOTE — ED Notes (Signed)
Patient denies CP or SOB

## 2018-11-11 NOTE — Telephone Encounter (Signed)
Pt c/o BP issue: STAT if pt c/o blurred vision, one-sided weakness or slurred speech  1. What are your last 5 BP readings? Took BP last night 1 am- 155/95 HR 100 3 am - 141/89 HR 96 5 am -147/79 HR 87 8:34 am -153/80 HR 118 Just now -143/71 HR 155  2. Are you having any other symptoms (ex. Dizziness, headache, blurred vision, passed out)? No,. Just shaky all over  3. What is your BP issue? Elevated, HR elevated

## 2018-11-11 NOTE — ED Notes (Signed)
MD at bedside explaining the plan for her care with sister and niece at bedside

## 2018-11-11 NOTE — ED Notes (Signed)
MD Gollan at bedside

## 2018-11-11 NOTE — Telephone Encounter (Signed)
Call and s/w patient's husband. He said the ambulance took her to the hospital. EPic is showing she is currently in the ED.

## 2018-11-11 NOTE — Telephone Encounter (Signed)
Attempted to call the patient at the number left- this went to voice mail.   I left a message we will call her on her alternate #.

## 2018-11-11 NOTE — Sedation Documentation (Addendum)
Patient shocked 150J at this time

## 2018-11-11 NOTE — ED Provider Notes (Signed)
[facility] Emergency Department Provider Note  ____________________________________________   I have reviewed the triage vital signs and the nursing notes.   HISTORY  Chief Complaint Tachycardia   History limited by: Not Limited   HPI Teresa [name] is a 77 y.o. female who presents to the emergency department today because of concerns for elevated heart rate.  The patient started feeling that her heart was beating fast earlier this morning.  She used her blood pressure machine and noted that the reading was very high.  The patient has a history of the same.  Recently underwent a cardioversion secondary to A. fib almost 2 weeks ago.  States she is felt like she has done fine since that time.  She denies any associated chest pain with this.  Does feel like she has some shortness of breath with exertion.   Per medical record review patient has a history of recent cardioversion, SVT  Past Medical History:  Diagnosis Date  . Arthritis   . Asthma   . Back pain   . Breast cancer ([place]) 2009   left breast   . CAD (coronary artery disease)    a. 1994 s/p CABG x 1 (LIMA->LAD); b. 03/2015 MV: No ischemia; c. MV 11/18: small fixed apical defect likely secondary to breast attenuation, EF of 42%, frequent PVCs; d. 12/2017 Cath: LM nl, LAD 20p, D1/2/3 nl, LCX min irregs, OM1/2/3 min irregs, RCA nl, RPDA nl, RPL1/2 nl, LIMA->LAD atretic.  Marland Kitchen Dental crowns present    caps- left back top, right back bottom  . Gastroesophageal reflux disease   . Hypertension   . MI (myocardial infarction) ([place]) 1994  . Mixed Ischemic & Nonischemic cardiomyopathy    a. 2013 EF 40%;  b. 03/2015 Echo: EF 55-60%, mild MR, mod dil LA, nl RV fxn; c. 12/18 Echo: EF of 35-40%, hypokinesis of the anteroseptal, and apical myocardium, mild to mod MR, mod dil LA, nl RV fxn, PASP 53 mmHg; d. 12/2017 TEE: EF 35-40%, diff HK, mild to mod MR. small PFO. No LAA/RAA thrombus.  Marland Kitchen PAF (paroxysmal atrial  fibrillation) ([place])    a. diagnosed 01/13/2018; b. CHADS2VASc = 6 --> Eliquis; c. 12/2017 s/p TEE/DCCV. Amio started but d/c'd 01/2018 2/2 n/anorexia.  . Personal history of radiation therapy   . Psoriasis   . PSVT (paroxysmal supraventricular tachycardia) ([place])    a. 02/2015 Holter: short runs of SVT and NSVT.  Marland Kitchen Pulmonary hypertension ([place])   . PVC's (premature ventricular contractions)    a. 03/2018 24h Holter: Freq PVC's with a total of 421 beats in 24 hrs. 7 short runs of SVT likely representing Afib.  . Rheumatoid arthritis (HCC)    feet, hands  . Vertigo    approx 2x/yr    Patient Active Problem List   Diagnosis Date Noted  . A-fib ([place]) 10/12/2018  . Rheumatoid arthritis ([place]) 10/10/2018  . Atrial fibrillation with RVR ([place]) 10/10/2018  . Psoriatic arthritis ([place]) 03/04/2018  . Unstable angina ([place])   . Atrial fibrillation with rapid ventricular response ([place]) 01/13/2018  . 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 Asthma 05/09/2017  . 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 Arthralgia of both hands 01/31/2016  . Ventricular premature depolarization 09/11/2015  . Psoriasis 07/16/2015  . 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 Hyperlipidemia 03/29/2015  . Pre-syncope 03/04/2015  . CAD (coronary artery disease)   . PVC's (premature ventricular contractions)   . Ischemic cardiomyopathy   .  DDD (degenerative disc disease), lumbar 01/18/2015  . Intervertebral disc disorder with radiculopathy of lumbar region 12/26/2014  . L-S radiculopathy 07/03/2014  . Breast CA ([place]) 06/28/2014  . Arthritis, degenerative 06/28/2014  . Acid reflux 03/08/2014  . 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    Past Surgical History:  Procedure Laterality Date  . ABDOMINAL HYSTERECTOMY    . back fusion    . BREAST BIOPSY Right    1991 negative  . BREAST EXCISIONAL BIOPSY Left    2009 positive  . BREAST LUMPECTOMY    . [place] N/A 01/15/2018   Procedure: CARDIOVERSION;  Surgeon: Wellington Hampshire, MD;  Location: [facility] ORS;  Service: Cardiovascular;  Laterality: N/A;  . CARDIOVERSION N/A 10/29/2018   Procedure: CARDIOVERSION;  Surgeon: Minna Merritts, MD;  Location: [facility] ORS;  Service: Cardiovascular;  Laterality: N/A;  . CHOLECYSTECTOMY    . CORONARY ARTERY BYPASS GRAFT  1994   1 vessel - [facility]  . FOOT ARTHRODESIS Right 07/24/2016   Procedure: FUSION FIRST METATARSAL CUNEIFORM JOINT RIGHT FOOT, FUSION SECOND METATARSAL CUNEFORM JOINT BUNION REPAIR RIGHT FOOT;  Surgeon: Albertine Patricia, DPM;  Location: [place];  Service: Podiatry;  Laterality: Right;  . FOOT FRACTURE SURGERY    . HARDWARE REMOVAL Right 07/24/2016   Procedure: REMOVAL HARDWARE LATERAL MALLEOUS RIGHT ANKLE;  Surgeon: Albertine Patricia, DPM;  Location: [place];  Service: Podiatry;  Laterality: Right;  REMOVAL OF PIN WHICH WAS INTACT  . HERNIA REPAIR    . NECK SURGERY    . RIGHT/LEFT HEART CATH AND CORONARY/GRAFT ANGIOGRAPHY N/A 01/14/2018   Procedure: LEFT HEART CATH AND CORONARY ANGIOGRAPHY;  Surgeon: Wellington Hampshire, MD;  Location: [place] [facility];  Service: Cardiovascular;  Laterality: N/A;  . TEE WITHOUT CARDIOVERSION N/A 01/15/2018   Procedure: TRANSESOPHAGEAL ECHOCARDIOGRAM (TEE);  Surgeon: Wellington Hampshire, MD;  Location: [facility] ORS;  Service: Cardiovascular;  Laterality: N/A;  . TOTAL HIP ARTHROPLASTY Left   . TOTAL KNEE ARTHROPLASTY Left     Prior to Admission medications   Medication Sig Start Date End Date Taking? Authorizing Provider  acetaminophen (TYLENOL) 325 MG tablet Take 975 mg by mouth every 8 (eight) hours. Three tablets to = 975 mg    [provider]  apixaban (ELIQUIS) 5 MG TABS tablet Take 1 tablet (5 mg total) by mouth 2 (two) times daily. 03/05/18   Minna Merritts, MD  Apremilast (OTEZLA) 30 MG TABS Take 30 mg by  mouth 2 (two) times daily.    [provider]  budesonide-formoterol (SYMBICORT) 160-4.5 MCG/ACT inhaler Inhale 2 puffs into the lungs 2 (two) times daily. Patient not taking: Reported on 10/26/2018 10/06/16   Jerrol Banana., MD  diltiazem (CARDIZEM) 30 MG tablet Take 30 mg by mouth 3 (three) times daily as needed (For a heart rate greater than 100).    [provider]  fluticasone (FLONASE) 50 MCG/ACT nasal spray Place 2 sprays into both nostrils daily as needed for allergies.     [provider]  furosemide (LASIX) 20 MG tablet Take 1 tablet (20 mg total) by mouth daily. Take for four days only. Patient not taking: Reported on 10/26/2018 10/21/18 01/19/19  Rise Mu, PA-C  gabapentin (NEURONTIN) 300 MG capsule Take 600 mg by mouth 3 (three) times daily.     [provider]  meclizine (ANTIVERT) 25 MG  tablet Take 25 mg by mouth 3 (three) times daily as needed for dizziness.  04/03/15   [provider]  methocarbamol (ROBAXIN) 750 MG tablet Take 750 mg by mouth 3 (three) times daily.    [provider]  metoprolol succinate (TOPROL XL) 100 MG 24 hr tablet Take 1 tablet (100 mg total) by mouth 2 (two) times daily. Take with or immediately following a meal. 10/13/18 11/12/18  Pyreddy, Reatha Harps, MD  mometasone-formoterol (DULERA) 200-5 MCG/ACT AERO Inhale 2 puffs into the lungs 2 (two) times daily as needed for wheezing or shortness of breath.    [provider]  montelukast (SINGULAIR) 10 MG tablet Take 10 mg by mouth daily as needed (for allergies).  04/02/15   [provider]  Multiple Vitamins-Minerals (MULTIVITAMIN PO) Take 1 tablet by mouth daily.    [provider]  oxycodone (OXY-IR) 5 MG capsule Take 1 capsule (5 mg total) by mouth every 4 (four) hours as needed for pain (Take 1/2 to 1 tablet). Patient taking differently: Take 2.5-5 mg by mouth every 4 (four) hours as needed for pain.  10/08/18   Medina-Vargas,  Monina C, NP  Polyethyl Glycol-Propyl Glycol (SYSTANE OP) Place 1 drop into both eyes daily as needed (dry eyes).    [provider]  rosuvastatin (CRESTOR) 5 MG tablet Take 1 tablet (5 mg total) by mouth daily. 11/01/18   Minna Merritts, MD  sennosides-docusate sodium (SENOKOT-S) 8.6-50 MG tablet Take 1 tablet by mouth daily as needed for constipation.     [provider]  venlafaxine (EFFEXOR) 75 MG tablet Take 2 tablets (150 mg total) by mouth daily. Patient taking differently: Take 75 mg by mouth 2 (two) times daily.  07/09/18   Jerrol Banana., MD    Allergies Sulfa antibiotics; Ivp dye [iodinated diagnostic agents]; Amiodarone; and Codeine  Family History  Problem Relation Age of Onset  . Heart attack Father   . Congestive Heart Failure Mother   . Breast cancer Mother 1  . Kidney failure Sister   . Prostate cancer Brother   . Bladder Cancer Brother   . Lung cancer Sister   . Arthritis Sister   . Breast cancer Sister   . Arthritis Sister   . Breast cancer Sister   . Lung cancer Sister   . Lung cancer Brother   . Arthritis Brother     Social History Social History   Tobacco Use  . Smoking status: Former Smoker    Packs/day: 0.50    Years: 35.00    Pack years: 17.50    Types: Cigarettes  . Smokeless tobacco: Former Systems developer    Quit date: 12/22/1978  . Tobacco comment: Quit approx 1990  Substance Use Topics  . Alcohol use: No  . Drug use: No    Review of Systems Constitutional: No fever/chills Eyes: No visual changes. ENT: No sore throat. Cardiovascular: Positive for palpitations. Respiratory: Positive for shortness of breath with exertion Gastrointestinal: No abdominal pain.  No nausea, no vomiting.  No diarrhea.   Genitourinary: Negative for dysuria. Musculoskeletal: Negative for back pain. Skin: Negative for rash. Neurological: Negative for headaches, focal weakness or  numbness.  ____________________________________________   PHYSICAL EXAM:  VITAL SIGNS: ED Triage Vitals [11/11/18 1205]  Enc Vitals Group     BP      Pulse      Resp      Temp      Temp src      SpO2  Weight      Height      Head Circumference      Peak Flow      Pain Score 1     Pain Loc      Pain Edu?      Excl. in [place]?     Constitutional: Alert and oriented.  Eyes: Conjunctivae are normal.  ENT      Head: Normocephalic and atraumatic.      Nose: No congestion/rhinnorhea.      Mouth/Throat: Mucous membranes are moist.      Neck: No stridor. Hematological/Lymphatic/Immunilogical: No cervical lymphadenopathy. Cardiovascular: Tachycardic, irregular rhythm.  No murmurs, rubs, or gallops.  Respiratory: Normal respiratory effort without tachypnea nor retractions. Breath sounds are clear and equal bilaterally. No wheezes/rales/rhonchi. Gastrointestinal: Soft and non tender. No rebound. No guarding.  Genitourinary: Deferred Musculoskeletal: Normal range of motion in all extremities. No lower extremity edema. Neurologic:  Normal speech and language. No gross focal neurologic deficits are appreciated.  Skin:  Skin is warm, dry and intact. No rash noted. Psychiatric: Mood and affect are normal. Speech and behavior are normal. Patient exhibits appropriate insight and judgment.  ____________________________________________    LABS (pertinent positives/negatives)  CBC wbc 6.5, hgb 13.7, plt 249 BMP wnl except glu 120, BUN 7  ____________________________________________   EKG  I, Nance Pear, attending physician, personally viewed and interpreted this EKG  EKG Time: 1201 Rate: 169 Rhythm: wide complex tachycardia Axis: left axis deviation Intervals: qtc 557 QRS: IVCD ST changes: no st elevation Impression: abnormal ekg  ____________________________________________     RADIOLOGY  None  ____________________________________________   PROCEDURES  .Sedation Date/Time: 11/11/2018 12:32 PM Performed by: Nance Pear, MD Authorized by: Nance Pear, MD   Consent:    Consent obtained:  Written   Consent given by:  Patient   Risks discussed:  Prolonged hypoxia resulting in organ damage, respiratory compromise necessitating ventilatory assistance and intubation, inadequate sedation, nausea and dysrhythmia Universal protocol:    Immediately prior to procedure a time out was called: yes   Indications:    Procedure performed:  Cardioversion Pre-sedation assessment:    Time since last food or drink:  Emergent   NPO status caution: urgency dictates proceeding with non-ideal NPO status     ASA classification: class 2 - patient with mild systemic disease     Mallampati score:  III - soft palate, base of uvula visible   Pre-sedation assessments completed and reviewed: airway patency, cardiovascular function, hydration status and mental status   Immediate pre-procedure details:    Reviewed: vital signs and relevant labs/tests     Verified: bag valve mask available, emergency equipment available, intubation equipment available, IV patency confirmed, oxygen available and suction available   Procedure details (see MAR for exact dosages):    Preoxygenation:  Nasal cannula   Sedation:  Propofol   Analgesia:  None   Intra-procedure monitoring:  Blood pressure monitoring, cardiac monitor, continuous pulse oximetry and frequent vital sign checks   Intra-procedure events: none     Total Provider sedation time (minutes):  15 Post-procedure details:    Attendance: Constant attendance by certified staff until patient recovered     Recovery: Patient returned to pre-procedure baseline     Post-sedation assessments completed and reviewed: airway patency, cardiovascular function, mental status, nausea/vomiting and respiratory function     Patient is stable for  discharge or admission: yes     Patient tolerance:  Tolerated well, no immediate complications .Cardioversion Date/Time: 11/11/2018 12:33 PM  Performed by: Nance Pear, MD Authorized by: Nance Pear, MD   Consent:    Consent obtained:  Written   Consent given by:  Patient   Risks discussed:  Induced arrhythmia and pain   Alternatives discussed:  Rate-control medication Pre-procedure details:    Cardioversion basis:  Emergent   Rhythm:  Atrial fibrillation   Electrode placement:  Anterior-posterior Patient sedated: Yes. Refer to sedation procedure documentation for details of sedation.  Attempt one:    Cardioversion mode:  Synchronous   Waveform:  Biphasic   Shock (Joules):  150   Shock outcome:  Conversion to normal sinus rhythm Attempt two:    Cardioversion mode:  Synchronous   Waveform:  Biphasic   Shock (Joules):  150   Shock outcome:  Conversion to normal sinus rhythm Post-procedure details:    Patient status:  Awake   Patient tolerance of procedure:  Tolerated well, no immediate complications  CRITICAL CARE Performed by: Nance Pear   Total critical care time: 35 minutes  Critical care time was exclusive of separately billable procedures and treating other patients.  Critical care was necessary to treat or prevent imminent or life-threatening deterioration.  Critical care was time spent personally by me on the following activities: development of treatment plan with patient and/or surrogate as well as nursing, discussions with consultants, evaluation of patient's response to treatment, examination of patient, obtaining history from patient or surrogate, ordering and performing treatments and interventions, ordering and review of laboratory studies, ordering and review of radiographic studies, pulse oximetry and re-evaluation of patient's condition.   ____________________________________________   INITIAL IMPRESSION / ASSESSMENT AND PLAN / ED  COURSE  Pertinent labs & imaging results that were available during my care of the patient were reviewed by me and considered in my medical decision making (see chart for details).   Patient presented to the emergency department today with concerns for fast heart rate.  Initial EKG was consistent with A. fib with RVR.  Patient has history of the same and had cardioversion done roughly 2 weeks ago.  I discussed with Dr. Rockey Situ.  Given that the patient is anticoagulated (patient states she has been taking her Eliquis) he felt reasonable to attempt cardioversion here in the emergency department.  I did consent the patient for this procedure.  Patient was shocked 2 times.  Both time she had return to normal sinus rhythm however within 30 seconds went back to A. fib.  At this point again discussed with Dr. Rockey Situ.  Will plan on admission for rate control.  Patient was given IV diltiazem.  Discussed plans with patient.  ____________________________________________   FINAL CLINICAL IMPRESSION(S) / ED DIAGNOSES  Final diagnoses:  Atrial fibrillation with RVR ([place])     Note: This dictation was prepared with Dragon dictation. Any transcriptional errors that result from this process are unintentional     Nance Pear, MD 11/11/18 1615

## 2018-11-11 NOTE — ED Notes (Signed)
Patient helped on bedpan. 

## 2018-11-11 NOTE — H&P (Signed)
[facility] NAME: Teresa [name]    MR#:  [id number]  DATE OF BIRTH:  1941-04-26  DATE OF ADMISSION:  11/11/2018  PRIMARY CARE PHYSICIAN: Jerrol Banana., MD   REQUESTING/REFERRING PHYSICIAN: dr Archie Balboa  CHIEF COMPLAINT:   palpitations HISTORY OF PRESENT ILLNESS:  Teresa [name]  is a 77 y.o. female with a known history of PAF with recent cardioversion 2 weeks ago and CAD presents to the ER via EMS due to palpitations.  She was noted to be in atrial fibrillation RVR.  She was cardioverted x2 in the emergency room however still remains in atrial fibrillation.  ER physician discussed case with patient's cardiologist Dr. Rockey Situ. She has been given diltiazem in addition to cardioversion.  PAST MEDICAL HISTORY:   Past Medical History:  Diagnosis Date  . Arthritis   . Asthma   . Back pain   . Breast cancer ([place]) 2009   left breast   . CAD (coronary artery disease)    a. 1994 s/p CABG x 1 (LIMA->LAD); b. 03/2015 MV: No ischemia; c. MV 11/18: small fixed apical defect likely secondary to breast attenuation, EF of 42%, frequent PVCs; d. 12/2017 Cath: LM nl, LAD 20p, D1/2/3 nl, LCX min irregs, OM1/2/3 min irregs, RCA nl, RPDA nl, RPL1/2 nl, LIMA->LAD atretic.  Marland Kitchen Dental crowns present    caps- left back top, right back bottom  . Gastroesophageal reflux disease   . Hypertension   . MI (myocardial infarction) ([place]) 1994  . Mixed Ischemic & Nonischemic cardiomyopathy    a. 2013 EF 40%;  b. 03/2015 Echo: EF 55-60%, mild MR, mod dil LA, nl RV fxn; c. 12/18 Echo: EF of 35-40%, hypokinesis of the anteroseptal, and apical myocardium, mild to mod MR, mod dil LA, nl RV fxn, PASP 53 mmHg; d. 12/2017 TEE: EF 35-40%, diff HK, mild to mod MR. small PFO. No LAA/RAA thrombus.  Marland Kitchen PAF (paroxysmal atrial fibrillation) ([place])    a. diagnosed 01/13/2018; b. CHADS2VASc = 6 --> Eliquis; c. 12/2017 s/p TEE/DCCV. Amio started but d/c'd 01/2018 2/2 n/anorexia.  .  Personal history of radiation therapy   . Psoriasis   . PSVT (paroxysmal supraventricular tachycardia) ([place])    a. 02/2015 Holter: short runs of SVT and NSVT.  Marland Kitchen Pulmonary hypertension ([place])   . PVC's (premature ventricular contractions)    a. 03/2018 24h Holter: Freq PVC's with a total of 421 beats in 24 hrs. 7 short runs of SVT likely representing Afib.  . Rheumatoid arthritis ([facility])    feet, hands  . Vertigo    approx 2x/yr    PAST SURGICAL HISTORY:   Past Surgical History:  Procedure Laterality Date  . ABDOMINAL HYSTERECTOMY    . back fusion    . BREAST BIOPSY Right    1991 negative  . BREAST EXCISIONAL BIOPSY Left    2009 positive  . BREAST LUMPECTOMY    . [employer] N/A 01/15/2018   Procedure: CARDIOVERSION;  Surgeon: Wellington Hampshire, MD;  Location: [facility]S;  Service: Cardiovascular;  Laterality: N/A;  . CARDIOVERSION N/A 10/29/2018   Procedure: CARDIOVERSION;  Surgeon: Minna Merritts, MD;  Location: [facility];  Service: Cardiovascular;  Laterality: N/A;  . CHOLECYSTECTOMY    . CORONARY ARTERY BYPASS GRAFT  1994   1 vessel - [facility]  . FOOT ARTHRODESIS Right 07/24/2016   Procedure:  FUSION FIRST METATARSAL CUNEIFORM JOINT RIGHT FOOT, FUSION SECOND METATARSAL CUNEFORM JOINT BUNION REPAIR RIGHT FOOT;  Surgeon: Albertine Patricia, DPM;  Location: [facility];  Service: Podiatry;  Laterality: Right;  . FOOT FRACTURE SURGERY    . HARDWARE REMOVAL Right 07/24/2016   Procedure: REMOVAL HARDWARE LATERAL MALLEOUS RIGHT ANKLE;  Surgeon: Albertine Patricia, DPM;  Location: [facility];  Service: Podiatry;  Laterality: Right;  REMOVAL OF PIN WHICH WAS INTACT  . HERNIA REPAIR    . NECK SURGERY    . RIGHT/LEFT HEART CATH AND CORONARY/GRAFT ANGIOGRAPHY N/A 01/14/2018   Procedure: LEFT HEART CATH AND CORONARY ANGIOGRAPHY;  Surgeon: Wellington Hampshire, MD;  Location: [facility];  Service: Cardiovascular;   Laterality: N/A;  . TEE WITHOUT CARDIOVERSION N/A 01/15/2018   Procedure: TRANSESOPHAGEAL ECHOCARDIOGRAM (TEE);  Surgeon: Wellington Hampshire, MD;  Location: [facility];  Service: Cardiovascular;  Laterality: N/A;  . TOTAL HIP ARTHROPLASTY Left   . TOTAL KNEE ARTHROPLASTY Left     SOCIAL HISTORY:   Social History   Tobacco Use  . Smoking status: Former Smoker    Packs/day: 0.50    Years: 35.00    Pack years: 17.50    Types: Cigarettes  . Smokeless tobacco: Former Systems developer    Quit date: 12/22/1978  . Tobacco comment: Quit approx 1990  Substance Use Topics  . Alcohol use: No    FAMILY HISTORY:   Family History  Problem Relation Age of Onset  . Heart attack Father   . Congestive Heart Failure Mother   . Breast cancer Mother 73  . Kidney failure Sister   . Prostate cancer Brother   . Bladder Cancer Brother   . Lung cancer Sister   . Arthritis Sister   . Breast cancer Sister   . Arthritis Sister   . Breast cancer Sister   . Lung cancer Sister   . Lung cancer Brother   . Arthritis Brother     DRUG ALLERGIES:   Allergies  Allergen Reactions  . Sulfa Antibiotics Hives  . Ivp Dye [Iodinated Diagnostic Agents] Swelling  . Amiodarone Nausea And Vomiting and Cough  . Codeine Other (See Comments)    ALTERED MENTAL STATUS    REVIEW OF SYSTEMS:   Review of Systems  Constitutional: Negative.  Negative for chills, fever and malaise/fatigue.  HENT: Negative.  Negative for ear discharge, ear pain, hearing loss, nosebleeds and sore throat.   Eyes: Negative.  Negative for blurred vision and pain.  Respiratory: Positive for shortness of breath. Negative for cough, hemoptysis and wheezing.   Cardiovascular: Negative.  Negative for chest pain, palpitations and leg swelling.  Gastrointestinal: Negative.  Negative for abdominal pain, blood in stool, diarrhea, nausea and vomiting.  Genitourinary: Negative.  Negative for dysuria.  Musculoskeletal: Negative.  Negative for back pain.  Skin:  Negative.   Neurological: Negative for dizziness, tremors, speech change, focal weakness, seizures and headaches.  Endo/Heme/Allergies: Negative.  Does not bruise/bleed easily.  Psychiatric/Behavioral: Negative.  Negative for depression, hallucinations and suicidal ideas.    MEDICATIONS AT HOME:   Prior to Admission medications   Medication Sig Start Date End Date Taking? Authorizing Provider  acetaminophen (TYLENOL) 325 MG tablet Take 975 mg by mouth every 8 (eight) hours. Three tablets to = 975 mg   Yes [provider]  Apremilast (OTEZLA) 30 MG TABS Take 30 mg by mouth 2 (two) times daily.   Yes [provider]  diltiazem (CARDIZEM) 30 MG tablet Take 30 mg  by mouth 3 (three) times daily as needed (For a heart rate greater than 100).   Yes [provider]  fluticasone (FLONASE) 50 MCG/ACT nasal spray Place 2 sprays into both nostrils daily as needed for allergies.    Yes [provider]  gabapentin (NEURONTIN) 300 MG capsule Take 600 mg by mouth 3 (three) times daily.    Yes [provider]  meclizine (ANTIVERT) 25 MG tablet Take 25 mg by mouth 3 (three) times daily as needed for dizziness.  04/03/15  Yes [provider]  methocarbamol (ROBAXIN) 750 MG tablet Take 750 mg by mouth 3 (three) times daily.   Yes [provider]  metoprolol succinate (TOPROL XL) 100 MG 24 hr tablet Take 1 tablet (100 mg total) by mouth 2 (two) times daily. Take with or immediately following a meal. 10/13/18 11/12/18 Yes Pyreddy, Reatha Harps, MD  mometasone-formoterol (DULERA) 200-5 MCG/ACT AERO Inhale 2 puffs into the lungs 2 (two) times daily as needed for wheezing or shortness of breath.   Yes [provider]  montelukast (SINGULAIR) 10 MG tablet Take 10 mg by mouth daily as needed (for allergies).  04/02/15  Yes [provider]  Multiple Vitamins-Minerals (MULTIVITAMIN PO) Take 1 tablet by mouth daily.   Yes [provider]   oxycodone (OXY-IR) 5 MG capsule Take 1 capsule (5 mg total) by mouth every 4 (four) hours as needed for pain (Take 1/2 to 1 tablet). Patient taking differently: Take 2.5-5 mg by mouth every 4 (four) hours as needed for pain.  10/08/18  Yes Medina-Vargas, Monina C, NP  rosuvastatin (CRESTOR) 5 MG tablet Take 1 tablet (5 mg total) by mouth daily. 11/01/18  Yes Gollan, Kathlene November, MD  sennosides-docusate sodium (SENOKOT-S) 8.6-50 MG tablet Take 1 tablet by mouth daily as needed for constipation.    Yes [provider]  apixaban (ELIQUIS) 5 MG TABS tablet Take 1 tablet (5 mg total) by mouth 2 (two) times daily. Patient not taking: Reported on 11/11/2018 03/05/18   Minna Merritts, MD  budesonide-formoterol [facility]) 160-4.5 MCG/ACT inhaler Inhale 2 puffs into the lungs 2 (two) times daily. Patient not taking: Reported on 10/26/2018 10/06/16   Jerrol Banana., MD  furosemide (LASIX) 20 MG tablet Take 1 tablet (20 mg total) by mouth daily. Take for four days only. Patient not taking: Reported on 10/26/2018 10/21/18 01/19/19  Rise Mu, PA-C  Polyethyl Glycol-Propyl Glycol (SYSTANE OP) Place 1 drop into both eyes daily as needed (dry eyes).    [provider]  venlafaxine (EFFEXOR) 75 MG tablet Take 2 tablets (150 mg total) by mouth daily. Patient taking differently: Take 75 mg by mouth 2 (two) times daily.  07/09/18   Jerrol Banana., MD      VITAL SIGNS:  Blood pressure 120/76, pulse 76, temperature 98.2 F (36.8 C), temperature source Oral, resp. rate 15, height 5\' 4"  (1.626 m), weight 81.6 kg, SpO2 97 %.  PHYSICAL EXAMINATION:   Physical Exam  Constitutional: She is oriented to person, place, and time. No distress.  HENT:  Head: Normocephalic.  Eyes: No scleral icterus.  Neck: Normal range of motion. Neck supple. No JVD present. No tracheal deviation present.  Cardiovascular: Normal heart sounds. Exam reveals no gallop and no friction rub.  No murmur  heard. Irregular, irregular tachycardia  Pulmonary/Chest: Effort normal and breath sounds normal. No respiratory distress. She has no wheezes. She has no rales. She exhibits no tenderness.  Abdominal: Soft. Bowel sounds  are normal. She exhibits no distension and no mass. There is no tenderness. There is no rebound and no guarding.  Musculoskeletal: Normal range of motion. She exhibits no edema.  Neurological: She is alert and oriented to person, place, and time.  Skin: Skin is warm. No rash noted. No erythema.  Psychiatric: Judgment normal.      LABORATORY PANEL:   CBC Recent Labs  Lab 11/11/18 1212  WBC 6.5  HGB 13.7  HCT 44.8  PLT 249   ------------------------------------------------------------------------------------------------------------------  Chemistries  Recent Labs  Lab 11/11/18 1212  NA 142  K 3.5  CL 106  CO2 25  GLUCOSE 120*  BUN 7*  CREATININE 0.55  CALCIUM 9.1   ------------------------------------------------------------------------------------------------------------------  Cardiac Enzymes No results for input(s): TROPONINI in the last 168 hours. ------------------------------------------------------------------------------------------------------------------  RADIOLOGY:  No results found.  EKG:  Wide QRS tachycardia heart rate 169  IMPRESSION AND PLAN:   77 year old female with a history of PAF with recent cardioversion on anticoagulation who presents again with atrial fibrillation and RVR and cardioverted in the emergency room however remains in atrial fibrillation.  1.  Atrial fibrillation with RVR status post cardioversion in the emergency room: Continue oral diltiazem and metoprolol for heart rate control Continue telemetry Patient not able to tolerate amiodarone so this is not an option. Follow-up with cardiology consultation Continue Eliquis for CVA prevention  2.Cardiomyopathy both non ischemic and ischemic EF 50% recent ECHO:  Continue Lasix and Metoprolol  3. CAD: Continue statin,metoprolol  4, HTN Continue Metoprolol and Diltiazem      All the records are reviewed and case discussed with ED provider. Management plans discussed with the patient and she is in agreement  CODE STATUS: FULL  TOTAL TIME TAKING CARE OF THIS PATIENT: 48 minutes.    Nusrat Encarnacion M.D on 11/11/2018 at 2:38 PM  Between 7am to 6pm - Pager - [phone]  After 6pm go to [email or web address] - password [email or web address] [facility]  Office  [phone]  CC: Primary care physician; Jerrol Banana., MD

## 2018-11-11 NOTE — ED Triage Notes (Signed)
From home by EMS. C/o HR issues. EMS vitals 146/87 initially, 120/89 enroute to ER, 96% RA, 129-170HR. Denies cp

## 2018-11-11 NOTE — Telephone Encounter (Signed)
Patient daughter called back to let our office know her mom was taken to the ED via ambulance after not receiving any assistance from our office.   Unable to obtain additional information as daughter abruptly disconnected call.

## 2018-11-11 NOTE — Consult Note (Signed)
Cardiology Consultation:   Patient ID: SOPHEAP BASIC MRN: [id number]; DOB: Jan 13, 1941  Admit date: 11/11/2018 Date of Consult: 11/11/2018  Primary Care Provider: Jerrol Banana., MD Primary Cardiologist: Kathlyn Sacramento, MD Reason for consult: Atrial fibrillation with RVR, shortness of breath Physician requesting consult: Dr. Benjie Karvonen   Patient Profile:   Teresa [name] is a 77 year old woman with a history of coronary artery disease,  1-vessel CABG in 1994 with a LIMA to LAD, paroxysmal atrial fibrillation on anticoagulation, who presents with worsening tachycardia, shortness of breath, malaise.  Cardiology consulted for atrial fibrillation   History of Present Illness:   Ms. Satterwhite underwent cardioversion October 29, 2018, performed by myself which was successful Reports that she maintained normal sinus rhythm until yesterday when she developed tachycardia, malaise, shortness of breath Presented to the emergency room, underwent cardioversion x2 at 150 J which was unsuccessful in maintaining normal sinus rhythm  She reports prior severe side effects on amiodarone Has been maintained on metoprolol succinate 100 twice daily  Recent events include neck surgery September 23, 2018, developed atrial fibrillation 2 days later, Toprol increased at that time Echocardiogram at that time showed ejection fraction 35%, moderate LV and mild RV dysfunction  Her neck pain has improved, collar is off Now having severe bilateral arm burning at nighttime feels like her arms are being crushed Past several nights with severe pain, unable to sleep She is concerned the stress of her burning nerve arm pain may have pushed her into atrial fibrillation  Other past medical history as detailed below 2018, atypical chest discomfort  stress testing which showed a fixed apical defect felt to be secondary to breast attenuation. EF was 42%.  echocardiogram which confirmed LV dysfunction with EF of 35 to 40%.      catheterization, in 12/2017, which revealed an atretic LIMA to the LAD, and otherwise nonobstructive disease.  TEE and cardioversion, which was successful.   Past Medical History:  Diagnosis Date  . Arthritis   . Asthma   . Back pain   . Breast cancer ([place]) 2009   left breast   . CAD (coronary artery disease)    a. 1994 s/p CABG x 1 (LIMA->LAD); b. 03/2015 MV: No ischemia; c. MV 11/18: small fixed apical defect likely secondary to breast attenuation, EF of 42%, frequent PVCs; d. 12/2017 Cath: LM nl, LAD 20p, D1/2/3 nl, LCX min irregs, OM1/2/3 min irregs, RCA nl, RPDA nl, RPL1/2 nl, LIMA->LAD atretic.  Marland Kitchen Dental crowns present    caps- left back top, right back bottom  . Gastroesophageal reflux disease   . Hypertension   . MI (myocardial infarction) ([place]) 1994  . Mixed Ischemic & Nonischemic cardiomyopathy    a. 2013 EF 40%;  b. 03/2015 Echo: EF 55-60%, mild MR, mod dil LA, nl RV fxn; c. 12/18 Echo: EF of 35-40%, hypokinesis of the anteroseptal, and apical myocardium, mild to mod MR, mod dil LA, nl RV fxn, PASP 53 mmHg; d. 12/2017 TEE: EF 35-40%, diff HK, mild to mod MR. small PFO. No LAA/RAA thrombus.  Marland Kitchen PAF (paroxysmal atrial fibrillation) ([place])    a. diagnosed 01/13/2018; b. CHADS2VASc = 6 --> Eliquis; c. 12/2017 s/p TEE/DCCV. Amio started but d/c'd 01/2018 2/2 n/anorexia.  . Personal history of radiation therapy   . Psoriasis   . PSVT (paroxysmal supraventricular tachycardia) ([place])    a. 02/2015 Holter: short runs of SVT and NSVT.  Marland Kitchen Pulmonary hypertension ([place])   . PVC's (premature ventricular contractions)  a. 03/2018 24h Holter: Freq PVC's with a total of 421 beats in 24 hrs. 7 short runs of SVT likely representing Afib.  . Rheumatoid arthritis ([facility])    feet, hands  . Vertigo    approx 2x/yr    Past Surgical History:  Procedure Laterality Date  . ABDOMINAL HYSTERECTOMY    . back fusion    . BREAST BIOPSY Right    1991 negative  . BREAST EXCISIONAL BIOPSY Left    2009  positive  . BREAST LUMPECTOMY    . [place] N/A 01/15/2018   Procedure: CARDIOVERSION;  Surgeon: Wellington Hampshire, MD;  Location: [facility];  Service: Cardiovascular;  Laterality: N/A;  . CARDIOVERSION N/A 10/29/2018   Procedure: CARDIOVERSION;  Surgeon: Minna Merritts, MD;  Location: [facility];  Service: Cardiovascular;  Laterality: N/A;  . CHOLECYSTECTOMY    . CORONARY ARTERY BYPASS GRAFT  1994   1 vessel - [facility]  . FOOT ARTHRODESIS Right 07/24/2016   Procedure: FUSION FIRST METATARSAL CUNEIFORM JOINT RIGHT FOOT, FUSION SECOND METATARSAL CUNEFORM JOINT BUNION REPAIR RIGHT FOOT;  Surgeon: Albertine Patricia, DPM;  Location: [place];  Service: Podiatry;  Laterality: Right;  . FOOT FRACTURE SURGERY    . HARDWARE REMOVAL Right 07/24/2016   Procedure: REMOVAL HARDWARE LATERAL MALLEOUS RIGHT ANKLE;  Surgeon: Albertine Patricia, DPM;  Location: [place];  Service: Podiatry;  Laterality: Right;  REMOVAL OF PIN WHICH WAS INTACT  . HERNIA REPAIR    . NECK SURGERY    . RIGHT/LEFT HEART CATH AND CORONARY/GRAFT ANGIOGRAPHY N/A 01/14/2018   Procedure: LEFT HEART CATH AND CORONARY ANGIOGRAPHY;  Surgeon: Wellington Hampshire, MD;  Location: [place] [facility];  Service: Cardiovascular;  Laterality: N/A;  . TEE WITHOUT CARDIOVERSION N/A 01/15/2018   Procedure: TRANSESOPHAGEAL ECHOCARDIOGRAM (TEE);  Surgeon: Wellington Hampshire, MD;  Location: [facility];  Service: Cardiovascular;  Laterality: N/A;  . TOTAL HIP ARTHROPLASTY Left   . TOTAL KNEE ARTHROPLASTY Left      Home Medications:  Prior to Admission medications   Medication Sig Start Date End Date Taking? Authorizing Provider  acetaminophen (TYLENOL) 325 MG tablet Take 975 mg by mouth every 8 (eight) hours. Three tablets to = 975 mg   Yes [provider]  Apremilast (OTEZLA) 30 MG TABS Take 30 mg by mouth 2 (two) times daily.   Yes [provider]    diltiazem (CARDIZEM) 30 MG tablet Take 30 mg by mouth 3 (three) times daily as needed (For a heart rate greater than 100).   Yes [provider]  fluticasone (FLONASE) 50 MCG/ACT nasal spray Place 2 sprays into both nostrils daily as needed for allergies.    Yes [provider]  gabapentin (NEURONTIN) 300 MG capsule Take 600 mg by mouth 3 (three) times daily.    Yes [provider]  meclizine (ANTIVERT) 25 MG tablet Take 25 mg by mouth 3 (three) times daily as needed for dizziness.  04/03/15  Yes [provider]  methocarbamol (ROBAXIN) 750 MG tablet Take 750 mg by mouth 3 (three) times daily.   Yes [provider]  metoprolol succinate (TOPROL XL) 100 MG 24 hr tablet Take 1 tablet (100 mg total) by mouth 2 (two) times daily. Take with or immediately following a meal. 10/13/18 11/12/18 Yes Pyreddy, Pavan, MD  mometasone-formoterol (DULERA) 200-5 MCG/ACT AERO Inhale 2 puffs into the lungs 2 (  two) times daily as needed for wheezing or shortness of breath.   Yes [provider]  montelukast (SINGULAIR) 10 MG tablet Take 10 mg by mouth daily as needed (for allergies).  04/02/15  Yes [provider]  Multiple Vitamins-Minerals (MULTIVITAMIN PO) Take 1 tablet by mouth daily.   Yes [provider]  oxycodone (OXY-IR) 5 MG capsule Take 1 capsule (5 mg total) by mouth every 4 (four) hours as needed for pain (Take 1/2 to 1 tablet). Patient taking differently: Take 2.5-5 mg by mouth every 4 (four) hours as needed for pain.  10/08/18  Yes Medina-Vargas, Monina C, NP  rosuvastatin (CRESTOR) 5 MG tablet Take 1 tablet (5 mg total) by mouth daily. 11/01/18  Yes Srihaan Mastrangelo, Kathlene November, MD  sennosides-docusate sodium (SENOKOT-S) 8.6-50 MG tablet Take 1 tablet by mouth daily as needed for constipation.    Yes [provider]  apixaban (ELIQUIS) 5 MG TABS tablet Take 1 tablet (5 mg total) by mouth 2 (two) times daily. Patient not taking: Reported  on 11/11/2018 03/05/18   Minna Merritts, MD  budesonide-formoterol [facility]) 160-4.5 MCG/ACT inhaler Inhale 2 puffs into the lungs 2 (two) times daily. Patient not taking: Reported on 10/26/2018 10/06/16   Jerrol Banana., MD  furosemide (LASIX) 20 MG tablet Take 1 tablet (20 mg total) by mouth daily. Take for four days only. Patient not taking: Reported on 10/26/2018 10/21/18 01/19/19  Rise Mu, PA-C  Polyethyl Glycol-Propyl Glycol (SYSTANE OP) Place 1 drop into both eyes daily as needed (dry eyes).    [provider]  venlafaxine (EFFEXOR) 75 MG tablet Take 2 tablets (150 mg total) by mouth daily. Patient taking differently: Take 75 mg by mouth 2 (two) times daily.  07/09/18   Jerrol Banana., MD    Inpatient Medications: Scheduled Meds: . diltiazem  240 mg Oral Daily  . metoprolol succinate  100 mg Oral BID   Continuous Infusions: . diltiazem (CARDIZEM) infusion     PRN Meds: acetaminophen **OR** acetaminophen, meclizine  Allergies:    Allergies  Allergen Reactions  . Sulfa Antibiotics Hives  . Ivp Dye [Iodinated Diagnostic Agents] Swelling  . Amiodarone Nausea And Vomiting and Cough  . Codeine Other (See Comments)    ALTERED MENTAL STATUS    Social History:   Social History   Socioeconomic History  . Marital status: Divorced    Spouse name: Not on file  . Number of children: Not on file  . Years of education: Not on file  . Highest education level: Not on file  Occupational History  . Not on file  Social Needs  . Financial resource strain: Not on file  . Food insecurity:    Worry: Not on file    Inability: Not on file  . Transportation needs:    Medical: Not on file    Non-medical: Not on file  Tobacco Use  . Smoking status: Former Smoker    Packs/day: 0.50    Years: 35.00    Pack years: 17.50    Types: Cigarettes  . Smokeless tobacco: Former Systems developer    Quit date: 12/22/1978  . Tobacco comment: Quit approx 1990  Substance and Sexual  Activity  . Alcohol use: No  . Drug use: No  . Sexual activity: Not on file  Lifestyle  . Physical activity:    Days per week: Not on file    Minutes per session: Not on file  . Stress: Not on file  Relationships  . Social connections:    Talks on phone: Not on file    Gets together: Not on file    Attends religious service: Not on file    Active member of club or organization: Not on file    Attends meetings of clubs or organizations: Not on file    Relationship status: Not on file  . Intimate partner violence:    Fear of current or ex partner: Not on file    Emotionally abused: Not on file    Physically abused: Not on file    Forced sexual activity: Not on file  Other Topics Concern  . Not on file  Social History Narrative  . Not on file    Family History:    Family History  Problem Relation Age of Onset  . Heart attack Father   . Congestive Heart Failure Mother   . Breast cancer Mother 67  . Kidney failure Sister   . Prostate cancer Brother   . Bladder Cancer Brother   . Lung cancer Sister   . Arthritis Sister   . Breast cancer Sister   . Arthritis Sister   . Breast cancer Sister   . Lung cancer Sister   . Lung cancer Brother   . Arthritis Brother      ROS:  Please see the history of present illness.  Review of Systems  Constitutional: Positive for malaise/fatigue.  Respiratory: Positive for shortness of breath.   Cardiovascular: Positive for palpitations.       Tachycardia  Gastrointestinal: Negative.   Musculoskeletal: Negative.   Neurological: Negative.   Psychiatric/Behavioral: Negative.   All other systems reviewed and are negative.    Physical Exam/Data:   Vitals:   11/11/18 1708 11/11/18 1830 11/11/18 1850 11/11/18 1900  BP: (!) 143/90 116/72 116/72 (!) 135/93  Pulse:  (!) 117 (!) 138 (!) 52  Resp:  13  16  Temp:      TempSrc:      SpO2:  93%  98%  Weight:      Height:       No intake or output data in the 24 hours ending 11/11/18  1948 Filed Weights   11/11/18 1206  Weight: 81.6 kg   Body mass index is 30.9 kg/m.  General: Mild distress, laying supine HEENT: normal Lymph: no adenopathy Neck: no JVD Endocrine:  No thryomegaly Vascular: No carotid bruits; FA pulses 2+ bilaterally without bruits  Cardiac: Irregularly irregular, rapid, no murmur appreciated Lungs:  clear to auscultation bilaterally, no wheezing, rhonchi or rales  Abd: soft, nontender, no hepatomegaly  Ext: no edema Musculoskeletal:  No deformities, BUE and BLE strength normal and equal Skin: warm and dry  Neuro:  CNs 2-12 intact, no focal abnormalities noted Psych:  Normal affect   EKG:  The EKG was personally reviewed and demonstrates:   Atrial fibrillation with left bundle branch block  Telemetry:  Telemetry was personally reviewed and demonstrates:   Atrial fibrillation with RVR rate 130 bpm  Relevant CV Studies: Echocardiogram April 2019 Left ventricle: The cavity size was normal. Wall thickness was   increased in a pattern of mild LVH. Systolic function was mildly   to moderately reduced. The estimated ejection fraction was in the   range of 40% to 45%. There is hypokinesis of the anteroseptal and   inferoseptal myocardium. Features are consistent with a   pseudonormal left ventricular filling pattern, with concomitant   abnormal relaxation and increased filling pressure (grade 2  diastolic dysfunction). - Mitral valve: There was mild regurgitation. - Left atrium: The atrium was moderately dilated. - Right ventricle: The cavity size was normal. Systolic function   was normal. - Atrial septum: A septal defect cannot be excluded. - Tricuspid valve: There was mild-moderate regurgitation. - Pulmonary arteries: Systolic pressure was mildly increased, in   the range of 35 mm Hg to 40 mm Hg.   Laboratory Data:  Chemistry Recent Labs  Lab 11/11/18 1212  NA 142  K 3.5  CL 106  CO2 25  GLUCOSE 120*  BUN 7*  CREATININE 0.55    CALCIUM 9.1  GFRNONAA >60  GFRAA >60  ANIONGAP 11    No results for input(s): PROT, ALBUMIN, AST, ALT, ALKPHOS, BILITOT in the last 168 hours. Hematology Recent Labs  Lab 11/11/18 1212  WBC 6.5  RBC 4.71  HGB 13.7  HCT 44.8  MCV 95.1  MCH 29.1  MCHC 30.6  RDW 14.6  PLT 249   Cardiac EnzymesNo results for input(s): TROPONINI in the last 168 hours. No results for input(s): TROPIPOC in the last 168 hours.  BNPNo results for input(s): BNP, PROBNP in the last 168 hours.  DDimer No results for input(s): DDIMER in the last 168 hours.  Radiology/Studies:  No results found.  Assessment and Plan:   1. Atrial fibrillation with RVR Difficult to control rate, Cardioversion twice in the emergency room was ineffective at 150 J She does not want amiodarone Diltiazem extended release 240 mg was given in the emergency room Recommended we give her regular dose of metoprolol succinate 100 mg, continue twice daily dosing --If rate continues to run fast could give digoxin If unable to control her rate and symptoms, would discuss with the EP Tikosyn an option but would probably need to be at [facility] for initiation in the hospital for 48 hours -Continue Eliquis twice daily   2.  Shortness of breath Secondary to atrial for ablation with RVR, will give Lasix x1  3) CAD: Hx of CABG Denies chest pain, no ischemic work-up at this time  4) neuropathic pain Burning in her arms at night, feels like they are being crushed Severe in the past 2 nights, shaking out of pain Low-dose pain pill did not help Has not slept Likely contributed to arrhythmia as detailed above She will likely need aggressive pain management overnight On Neurontin, this is not helping Recommend she discuss with primary care other medication such as Cymbalta or Lyrica -Would also discuss symptoms with neurosurgery/Outpatient  Long discussion with patient and family at the bedside concerning various treatment options  for her atrial fibrillation Discussed antiarrhythmics, cardioversion, rate control, medications for rate control Long discussion concerning her neuropathic pain, various types of medications  Total encounter time more than 110 minutes  Greater than 50% was spent in counseling and coordination of care with the patient   For questions or updates, please contact [place] Please consult [email or web address] for contact info under     Signed, Ida Rogue, MD  11/11/2018 7:48 PM

## 2018-11-11 NOTE — ED Notes (Signed)
Noted O2 sats continuously dropping into the low 90s as she slept. Started 2L O2 via nasal cannula. Dr Archie Balboa notified. Pt 02 sats now consistently over 96%. Will continue to monitor.

## 2018-11-11 NOTE — Progress Notes (Signed)
Talked to Dr. Rockey Situ about the plan for the patient. Patient is to be monitored, no cardizem drip for now she received 100 mg of metoprolol at ED wont need the scheduled metoprolol at 2200. Will cancel cardizem drip, will notify Dr. Rockey Situ if patient's HR still elevated and MD plan to give Digoxin if HR still elevated. HR now fluctuating to 110's-130's. RN will continue to monitor.

## 2018-11-11 NOTE — Progress Notes (Signed)
Chaplain responded to an OR for an AD. Pt already had an AD in the system. Chaplain met daughter and niece and offered prayer. Chaplain prayed for healing and family. Chaplain will follow up.    11/11/18 1500  Clinical Encounter Type  Visited With Patient and family together  Visit Type Initial  Referral From Physician  Spiritual Encounters  Spiritual Needs Brochure;Prayer

## 2018-11-11 NOTE — Progress Notes (Signed)
Family Meeting Note  Advance Directive:no  Today a meeting took place with the Patient.and daughter  The following clinical team members were present during this meeting:MD  The following were discussed:Patient's diagnosis:a fib rvr, Patient's progosis: > 12 months and Goals for treatment: Full Code  Additional follow-up to be provided: chaplain consult to create AD  Time spent during discussion:16 minutes  Atianna Haidar, Ulice Bold, MD

## 2018-11-11 NOTE — ED Notes (Signed)
ED Provider at bedside. 

## 2018-11-12 ENCOUNTER — Other Ambulatory Visit: Payer: Self-pay | Admitting: Cardiovascular Disease

## 2018-11-12 ENCOUNTER — Telehealth: Payer: Self-pay | Admitting: Family Medicine

## 2018-11-12 DIAGNOSIS — M792 Neuralgia and neuritis, unspecified: Secondary | ICD-10-CM

## 2018-11-12 LAB — BASIC METABOLIC PANEL
Anion gap: 8 (ref 5–15)
BUN: 7 mg/dL — ABNORMAL LOW (ref 8–23)
CO2: 26 mmol/L (ref 22–32)
Calcium: 8.4 mg/dL — ABNORMAL LOW (ref 8.9–10.3)
Chloride: 109 mmol/L (ref 98–111)
Creatinine, Ser: 0.49 mg/dL (ref 0.44–1.00)
GFR calc Af Amer: >60 mL/min (ref >60)
GFR calc non Af Amer: >60 mL/min (ref >60)
Glucose, Bld: 97 mg/dL (ref 70–99)
Potassium: 3.4 mmol/L — ABNORMAL LOW (ref 3.5–5.1)
Sodium: 143 mmol/L (ref 135–145)

## 2018-11-12 LAB — CBC
HCT: 38.8 % (ref 36.0–46.0)
Hemoglobin: 12.2 g/dL (ref 12.0–15.0)
MCH: 29.9 pg (ref 26.0–34.0)
MCHC: 31.4 g/dL (ref 30.0–36.0)
MCV: 95.1 fL (ref 80.0–100.0)
Platelets: 233 10*3/uL (ref 150–400)
RBC: 4.08 MIL/uL (ref 3.87–5.11)
RDW: 14.6 % (ref 11.5–15.5)
WBC: 5.5 10*3/uL (ref 4.0–10.5)
nRBC: 0 % (ref 0.0–0.2)

## 2018-11-12 MED ORDER — POTASSIUM CHLORIDE CRYS ER 20 MEQ PO TBCR
30.0000 meq | EXTENDED_RELEASE_TABLET | Freq: Two times a day (BID) | ORAL | Status: DC
  Administered 2018-11-12: 30 meq via ORAL
  Filled 2018-11-12: qty 1

## 2018-11-12 MED ORDER — POTASSIUM CHLORIDE CRYS ER 10 MEQ PO TBCR
EXTENDED_RELEASE_TABLET | ORAL | Status: AC
  Filled 2018-11-12: qty 2

## 2018-11-12 MED ORDER — DILTIAZEM HCL ER COATED BEADS 240 MG PO CP24: 240.0000 mg | ORAL_CAPSULE | Freq: Every day | ORAL | 1 refills | Status: DC

## 2018-11-12 MED ORDER — POTASSIUM CHLORIDE CRYS ER 20 MEQ PO TBCR
20.0000 meq | EXTENDED_RELEASE_TABLET | Freq: Once | ORAL | Status: DC
  Administered 2018-11-12: 20 meq via ORAL

## 2018-11-12 MED ORDER — POTASSIUM CHLORIDE CRYS ER 20 MEQ PO TBCR: 20.0000 meq | EXTENDED_RELEASE_TABLET | Freq: Once | ORAL | Status: DC

## 2018-11-12 MED ORDER — FUROSEMIDE 20 MG PO TABS: 20.0000 mg | ORAL_TABLET | Freq: Every day | ORAL | 1 refills | Status: DC | PRN

## 2018-11-12 NOTE — Discharge Summary (Signed)
[place] [facility] [place] NAME: Teresa [name]    MR#:  [id number]  DATE OF BIRTH:  [birth year]  DATE OF ADMISSION:  11/11/2018 ADMITTING PHYSICIAN: Bettey Costa, MD  DATE OF DISCHARGE: 11/12/2018  PRIMARY CARE PHYSICIAN: Jerrol Banana., MD    ADMISSION DIAGNOSIS:  Atrial fibrillation with RVR ([place]) [I48.91]  DISCHARGE DIAGNOSIS:  Active Problems:   Atrial fibrillation ([place])   SECONDARY DIAGNOSIS:   Past Medical History:  Diagnosis Date  . Arthritis   . Asthma   . Back pain   . Breast cancer ([place]) 2009   left breast   . CAD (coronary artery disease)    a. 1994 s/p CABG x 1 (LIMA->LAD); b. 03/2015 MV: No ischemia; c. MV 11/18: small fixed apical defect likely secondary to breast attenuation, EF of 42%, frequent PVCs; d. 12/2017 Cath: LM nl, LAD 20p, D1/2/3 nl, LCX min irregs, OM1/2/3 min irregs, RCA nl, RPDA nl, RPL1/2 nl, LIMA->LAD atretic.  Marland Kitchen Dental crowns present    caps- left back top, right back bottom  . Gastroesophageal reflux disease   . Hypertension   . MI (myocardial infarction) ([place]) 1994  . Mixed Ischemic & Nonischemic cardiomyopathy    a. 2013 EF 40%;  b. 03/2015 Echo: EF 55-60%, mild MR, mod dil LA, nl RV fxn; c. 12/18 Echo: EF of 35-40%, hypokinesis of the anteroseptal, and apical myocardium, mild to mod MR, mod dil LA, nl RV fxn, PASP 53 mmHg; d. 12/2017 TEE: EF 35-40%, diff HK, mild to mod MR. small PFO. No LAA/RAA thrombus.  Marland Kitchen PAF (paroxysmal atrial fibrillation) ([place])    a. diagnosed 01/13/2018; b. CHADS2VASc = 6 --> Eliquis; c. 12/2017 s/p TEE/DCCV. Amio started but d/c'd 01/2018 2/2 n/anorexia.  . Personal history of radiation therapy   . Psoriasis   . PSVT (paroxysmal supraventricular tachycardia) ([place])    a. 02/2015 Holter: short runs of SVT and NSVT.  Marland Kitchen Pulmonary hypertension ([place])   . PVC's (premature ventricular contractions)    a. 03/2018 24h Holter: Freq PVC's with a total of 421 beats in 24 hrs. 7 short runs of  SVT likely representing Afib.  . Rheumatoid arthritis ([facility])    feet, hands  . Vertigo    approx 2x/yr    HOSPITAL COURSE:   77 year old female history of paroxysmal atrial fibrillation, hypertension, GERD, history of breast cancer, coronary disease status post bypass, rheumatoid arthritis, psoriasis who presented to the hospital due to palpitations and noted to be atrial fibrillation with rapid ventricular response.  1.  Atrial fibrillation with rapid ventricular response-patient has a history of paroxysmal atrial fibrillation presented with rapid heart rates.  Cardioversion was attempted in the ER but was unsuccessful.  Patient was admitted to the hospital and started on oral Cardizem and metoprolol.  Patient apparently is intolerant to amiodarone. - After being on long-acting Cardizem and oral metoprolol patient's heart rates have improved.  She was ambulated and her heart rates remained stable but she remains in persistent A. fib. - Cardiology recommended discharging the patient on oral Cardizem and metoprolol with outpatient follow-up with EP for possible trial of Tikosyn.  - pt. Will resume her Eliquis for anti-coagulation.   2.  Essential hypertension-patient is hemodynamically stable.  She will continue her Toprol.  3.  History of coronary artery disease-patient had no acute chest pain.  She will continue her metoprolol, Eliquis Crestor.  4.  Neuropathy-patient will resume her gabapentin.  5. Hx of Depression -  cont. Effexor.    DISCHARGE CONDITIONS:   Stable.   CONSULTS OBTAINED:  Treatment Team:  Minna Merritts, MD  DRUG ALLERGIES:   Allergies  Allergen Reactions  . Sulfa Antibiotics Hives  . Ivp Dye [Iodinated Diagnostic Agents] Swelling  . Amiodarone Nausea And Vomiting and Cough  . Codeine Other (See Comments)    ALTERED MENTAL STATUS    DISCHARGE MEDICATIONS:   Allergies as of 11/12/2018      Reactions   Sulfa Antibiotics Hives   Ivp Dye [iodinated  Diagnostic Agents] Swelling   Amiodarone Nausea And Vomiting, Cough   Codeine Other (See Comments)   ALTERED MENTAL STATUS      Medication List    TAKE these medications   acetaminophen 325 MG tablet Commonly known as:  TYLENOL Take 975 mg by mouth every 8 (eight) hours. Three tablets to = 975 mg   apixaban 5 MG Tabs tablet Commonly known as:  ELIQUIS Take 1 tablet (5 mg total) by mouth 2 (two) times daily.   budesonide-formoterol 160-4.5 MCG/ACT inhaler Commonly known as:  SYMBICORT Inhale 2 puffs into the lungs 2 (two) times daily.   diltiazem 240 MG 24 hr capsule Commonly known as:  CARDIZEM CD Take 1 capsule (240 mg total) by mouth daily. Start taking on:  11/13/2018   diltiazem 30 MG tablet Commonly known as:  CARDIZEM Take 30 mg by mouth 3 (three) times daily as needed (For a heart rate greater than 100).   DULERA 200-5 MCG/ACT Aero Generic drug:  mometasone-formoterol Inhale 2 puffs into the lungs 2 (two) times daily as needed for wheezing or shortness of breath.   fluticasone 50 MCG/ACT nasal spray Commonly known as:  FLONASE Place 2 sprays into both nostrils daily as needed for allergies.   furosemide 20 MG tablet Commonly known as:  LASIX Take 1 tablet (20 mg total) by mouth daily as needed (shortness of breath). What changed:    when to take this  reasons to take this  additional instructions   gabapentin 300 MG capsule Commonly known as:  NEURONTIN Take 600 mg by mouth 3 (three) times daily.   meclizine 25 MG tablet Commonly known as:  ANTIVERT Take 25 mg by mouth 3 (three) times daily as needed for dizziness.   methocarbamol 750 MG tablet Commonly known as:  ROBAXIN Take 750 mg by mouth 3 (three) times daily.   metoprolol succinate 100 MG 24 hr tablet Commonly known as:  TOPROL-XL Take 1 tablet (100 mg total) by mouth 2 (two) times daily. Take with or immediately following a meal.   montelukast 10 MG tablet Commonly known as:   SINGULAIR Take 10 mg by mouth daily as needed (for allergies).   MULTIVITAMIN PO Take 1 tablet by mouth daily.   OTEZLA 30 MG Tabs Generic drug:  Apremilast Take 30 mg by mouth 2 (two) times daily.   oxycodone 5 MG capsule Commonly known as:  OXY-IR Take 1 capsule (5 mg total) by mouth every 4 (four) hours as needed for pain (Take 1/2 to 1 tablet). What changed:    how much to take  reasons to take this   rosuvastatin 5 MG tablet Commonly known as:  CRESTOR Take 1 tablet (5 mg total) by mouth daily.   sennosides-docusate sodium 8.6-50 MG tablet Commonly known as:  SENOKOT-S Take 1 tablet by mouth daily as needed for constipation.   SYSTANE OP Place 1 drop into both eyes daily as needed (dry eyes).  venlafaxine 75 MG tablet Commonly known as:  EFFEXOR Take 2 tablets (150 mg total) by mouth daily. What changed:    how much to take  when to take this         DISCHARGE INSTRUCTIONS:   DIET:  Cardiac diet  DISCHARGE CONDITION:  Stable  ACTIVITY:  Activity as tolerated  OXYGEN:  Home Oxygen: No.   Oxygen Delivery: room air  DISCHARGE LOCATION:  home   If you experience worsening of your admission symptoms, develop shortness of breath, life threatening emergency, suicidal or homicidal thoughts you must seek medical attention immediately by calling 911 or calling your MD immediately  if symptoms less severe.  You Must read complete instructions/literature along with all the possible adverse reactions/side effects for all the Medicines you take and that have been prescribed to you. Take any new Medicines after you have completely understood and accpet all the possible adverse reactions/side effects.   Please note  You were cared for by a hospitalist during your hospital stay. If you have any questions about your discharge medications or the care you received while you were in the hospital after you are discharged, you can call the unit and asked to speak  with the hospitalist on call if the hospitalist that took care of you is not available. Once you are discharged, your primary care physician will handle any further medical issues. Please note that NO REFILLS for any discharge medications will be authorized once you are discharged, as it is imperative that you return to your primary care physician (or establish a relationship with a primary care physician if you do not have one) for your aftercare needs so that they can reassess your need for medications and monitor your lab values.     Today   Heart rates are stable.  She was ambulating and did not have any further tachycardia and is asymptomatic and therefore being discharged home on oral Cardizem and follow-up with cardiology.  VITAL SIGNS:  Blood pressure 127/84, pulse 95, temperature 98.3 F (36.8 C), temperature source Oral, resp. rate 19, height 5\' 4"  (1.626 m), weight 81.6 kg, SpO2 93 %.  I/O:    Intake/Output Summary (Last 24 hours) at 11/12/2018 1454 Last data filed at 11/12/2018 1300 Gross per 24 hour  Intake 120 ml  Output 950 ml  Net -830 ml    PHYSICAL EXAMINATION:  GENERAL:  77 y.o.-year-old patient lying in the bed with no acute distress.  EYES: Pupils equal, round, reactive to light and accommodation. No scleral icterus. Extraocular muscles intact.  HEENT: Head atraumatic, normocephalic. Oropharynx and nasopharynx clear.  NECK:  Supple, no jugular venous distention. No thyroid enlargement, no tenderness.  LUNGS: Normal breath sounds bilaterally, no wheezing, rales,rhonchi. No use of accessory muscles of respiration.  CARDIOVASCULAR: S1, S2 normal. No murmurs, rubs, or gallops.  ABDOMEN: Soft, non-tender, non-distended. Bowel sounds present. No organomegaly or mass.  EXTREMITIES: No pedal edema, cyanosis, or clubbing.  NEUROLOGIC: Cranial nerves II through XII are intact. No focal motor or sensory defecits b/l.  PSYCHIATRIC: The patient is alert and oriented x 3.   SKIN: No obvious rash, lesion, or ulcer.   DATA REVIEW:   CBC Recent Labs  Lab 11/12/18 0529  WBC 5.5  HGB 12.2  HCT 38.8  PLT 233    Chemistries  Recent Labs  Lab 11/12/18 0529  NA 143  K 3.4*  CL 109  CO2 26  GLUCOSE 97  BUN 7*  CREATININE 0.49  CALCIUM 8.4*    Cardiac Enzymes No results for input(s): TROPONINI in the last 168 hours.  RADIOLOGY:  No results found.    Management plans discussed with the patient, family and they are in agreement.  CODE STATUS:     Code Status Orders  (From admission, onward)         Start     Ordered   11/11/18 2011  Full code  Continuous     11/11/18 2010          TOTAL TIME TAKING CARE OF THIS PATIENT: 40 minutes.    Henreitta Leber M.D on 11/12/2018 at 2:54 PM  Between 7am to 6pm - Pager - [phone]  After 6pm go to [email or web address] - password [email or web address] [facility]  Office  [phone]  CC: Primary care physician; Jerrol Banana., MD

## 2018-11-12 NOTE — Progress Notes (Signed)
Progress Note  Patient Name: Teresa [name] Date of Encounter: 11/12/2018  Primary Cardiologist: Fletcher Anon  Subjective   Feels well this morning. Remains in Afib with controlled ventricular rates in the 90s bpm. Dyspnea resolved. No chest pain.   Inpatient Medications    Scheduled Meds: . apixaban  5 mg Oral BID  . Apremilast  30 mg Oral BID  . diltiazem  240 mg Oral Daily  . gabapentin  600 mg Oral TID  . methocarbamol  750 mg Oral TID  . metoprolol succinate  100 mg Oral BID  . mometasone-formoterol  2 puff Inhalation BID  . multivitamin with minerals  1 tablet Oral Daily  . rosuvastatin  5 mg Oral Daily  . venlafaxine  75 mg Oral BID   Continuous Infusions:  PRN Meds: acetaminophen **OR** acetaminophen, bisacodyl, fluticasone, ibuprofen, meclizine, montelukast, ondansetron **OR** ondansetron (ZOFRAN) IV, polyethylene glycol, polyvinyl alcohol, senna-docusate   Vital Signs    Vitals:   11/11/18 1900 11/11/18 2010 11/12/18 0438 11/12/18 0744  BP: (!) 135/93 (!) 114/92 (!) 110/51 132/75  Pulse: (!) 52  73 78  Resp: 16 18 18 19   Temp:  97.8 F (36.6 C) 98.6 F (37 C) 98.3 F (36.8 C)  TempSrc:  Oral  Oral  SpO2: 98% 97% 98% 93%  Weight:      Height:        Intake/Output Summary (Last 24 hours) at 11/12/2018 0957 Last data filed at 11/12/2018 0700 Gross per 24 hour  Intake -  Output 950 ml  Net -950 ml   Filed Weights   11/11/18 1206  Weight: 81.6 kg    Telemetry    Afib, 90s bpm - Personally Reviewed  ECG    n/a - Personally Reviewed  Physical Exam   GEN: No acute distress.   Neck: No JVD. Cardiac: Irregularly irregular, I/VI systolic murmur at the apex, no rubs, or gallops.  Respiratory: Clear to auscultation bilaterally.  GI: Soft, nontender, non-distended.   MS: No edema; No deformity. Neuro:  Alert and oriented x 3; Nonfocal.  Psych: Normal affect.  Labs    Chemistry Recent Labs  Lab 11/11/18 1212 11/12/18 0529  NA 142 143  K  3.5 3.4*  CL 106 109  CO2 25 26  GLUCOSE 120* 97  BUN 7* 7*  CREATININE 0.55 0.49  CALCIUM 9.1 8.4*  GFRNONAA >60 >60  GFRAA >60 >60  ANIONGAP 11 8     Hematology Recent Labs  Lab 11/11/18 1212 11/12/18 0529  WBC 6.5 5.5  RBC 4.71 4.08  HGB 13.7 12.2  HCT 44.8 38.8  MCV 95.1 95.1  MCH 29.1 29.9  MCHC 30.6 31.4  RDW 14.6 14.6  PLT 249 233    Cardiac EnzymesNo results for input(s): TROPONINI in the last 168 hours. No results for input(s): TROPIPOC in the last 168 hours.   BNPNo results for input(s): BNP, PROBNP in the last 168 hours.   DDimer No results for input(s): DDIMER in the last 168 hours.   Radiology    No results found.  Cardiac Studies   n/a  Patient Profile     77 y.o. female with history of AD status post CABG x 1,chronic systolic CHF secondary to mixed ICM/NICM, PAF s/p successful TEE/DCCV 12/2017 and again in 10/2018 for recurrent Afib in the setting of recent surgery and severe pain on Eliquis with recurrent Afib, PSVT,hypertension, hyperlipidemia, frequent PVCs,breast cancer,and chronic pain who was admitted on 11/21 with recurrent Afib with  RVR.   Assessment & Plan    1. Persistent Afib with RVR: -Repeat DCCV in the ED was unsuccessful -Intolerant to amiodarone  -Remains in Afib with ventricular rates well controlled in the 90s bpm -Cardizem CD 240 gm daily along with Toprol XL 100 mg bid -Ambulate to assess for adequate rate control  -Recommend referral to the Afib Clinic for possible Tikosyn loading at [facility] -Eliquis 5 mg bid  2. CAD s/p CABG: -No chest pain -On Eliquis in place of ASA -Metoprolol and Crestor  -No plans for inpatient ischemic evaluation   3. Dyspnea: -Resolved -Ambulate as above  4. Neuropathic pain: -Per IM  5. Hypokalemia: -Replete to goal > 4.0   For questions or updates, please contact [place] Please consult [email or web address] for contact info under Cardiology/STEMI.    Signed, Christell Faith,  PA-C [facility] Pager: [phone] 11/12/2018, 9:57 AM

## 2018-11-12 NOTE — Telephone Encounter (Signed)
Pt is being discharged today and [facility] scheduled hospital f/u on 11/30/18 with Dr. Alben Spittle next available. They wanted pt seen in a week from discharge. Can pt be worked in sooner or should pt see another provider in the office? Please advise. Thanks TNP

## 2018-11-12 NOTE — Care Management Note (Signed)
Case Management Note  Patient Details  Name: Teresa [name] MRN: [id number] Date of Birth: 1940-12-26  Subjective/Objective:          Discharging to home today.  Patient is open to [place].  Corene Cornea with [facility] is aware of patient discharge.            Action/Plan:   Expected Discharge Date:  11/12/18               Expected Discharge Plan:  [place]  In-House Referral:     Discharge planning Services  CM Consult  Post Acute Care Choice:  Resumption of Svcs/PTA Provider Choice offered to:     DME Arranged:    DME Agency:     HH Arranged:    [place] Agency:     Status of Service:  Completed, signed off  If discussed at [employer] of Stay Meetings, dates discussed:    Additional Comments:  Elza Rafter, RN 11/12/2018, 1:42 PM

## 2018-11-12 NOTE — Telephone Encounter (Signed)
Please advise 

## 2018-11-15 ENCOUNTER — Telehealth: Payer: Self-pay | Admitting: Cardiovascular Disease

## 2018-11-15 ENCOUNTER — Other Ambulatory Visit: Payer: Self-pay

## 2018-11-15 NOTE — Telephone Encounter (Signed)
Patient daughter Teresa [name] calling stating pt is back in Afib  HR is ranging from 128-131  Would like advise for this happen last week and she ended up in ED  Would need a call soon

## 2018-11-15 NOTE — Patient Outreach (Signed)
[facility]) Care Management  11/15/2018  Teresa [name] 1941-04-14 [id number]     EMMI-General Discharge RED ON EMMI ALERT Day # 1 Date: 11/14/18 Red Alert Reason: " Unfilled prescriptions? Yes"   Outreach attempt # 1 to patient. Spoke with patient who voices she is doing fairly well. She denies any acute issues or concerns. Reviewed and addressed red alert with patient. Patient does not recall responding that way. She voices that she has all her meds. She denies any issues or concerns regarding them. Patient manages her meds on her own and fills med planner weekly. She voices that she is on a lot of meds but knows that these are meds she have to take and can not come off of them. She lives with supportive spouse. Patient reports that she was set up with outpatient rehab but will not be going. She voices that her co-pay is too expensive. She has not notified MD of this and RN CM encouraged patient to do so. She states that she has had [facility] in the past and did not like it and is not interested in it at this time. She has MD appt on 11/30/18 but has called office to see if she can be seen sooner. She states that due to her arm/hand issues she is unsure if she will be abl to drive herself to appt. She did not wish for SW assistance at this. Patient provided with RN CM contact info and advised to call back if transportation to MD appt needed. She voiced understanding. No further RN CM needs or concerns at this time. Advised patient that they would get one more automated EMMI-GENERAL post discharge calls to assess how they are doing following recent hospitalization and will receive a call from a nurse if any of their responses were abnormal. Patient voiced understanding and was appreciative of f/u call.       Plan: RN CM will close case at this time as no further interventions needed.    Enzo Montgomery, RN,BSN,CCM [facility] Telephonic Care Management  Coordinator Direct Phone: [phone] Toll Free: [phone] Fax: [phone]

## 2018-11-15 NOTE — Telephone Encounter (Signed)
S/w patient's daughter, ok per DPR.  States patient's HR was stable over the weekend after discharge on new increased dose of diltiazem CR. However, this morning HR was 108. This afternoon HR increased to 120's to 131. Patient can feel it. Has ordered diltiazem 30 mg TID prn for HR greater than 100. Advised for patient to take diltiazem 30 mg as prescribed for HR > 100. Also discussed with Christell Faith, PA-C who agreed with diltiazem 30 mg for HR >100 every 8 hours as needed and for patient to keep appointment tomorrow. Daughter was very appreciative and will let patient know plan of care.

## 2018-11-16 ENCOUNTER — Ambulatory Visit: Payer: Medicare HMO | Admitting: Nurse Practitioner

## 2018-11-16 ENCOUNTER — Encounter: Payer: Self-pay | Admitting: Nurse Practitioner

## 2018-11-16 ENCOUNTER — Telehealth: Payer: Self-pay | Admitting: Cardiovascular Disease

## 2018-11-16 VITALS — BP 110/60 | HR 86 | Ht 64.0 in | Wt 170.0 lb

## 2018-11-16 DIAGNOSIS — I4819 Other persistent atrial fibrillation: Secondary | ICD-10-CM | POA: Diagnosis not present

## 2018-11-16 DIAGNOSIS — I5022 Chronic systolic (congestive) heart failure: Secondary | ICD-10-CM | POA: Diagnosis not present

## 2018-11-16 DIAGNOSIS — I255 Ischemic cardiomyopathy: Secondary | ICD-10-CM | POA: Diagnosis not present

## 2018-11-16 MED ORDER — APIXABAN 5 MG PO TABS: 5.0000 mg | ORAL_TABLET | Freq: Two times a day (BID) | ORAL | 3 refills | Status: AC

## 2018-11-16 NOTE — Telephone Encounter (Signed)
About Tuesday, 3 December at 2:00.  Is blocked now but I will be back from a lunch meeting by that time.  You can unblock the 2:00 for her.  Thank you

## 2018-11-16 NOTE — Progress Notes (Signed)
Office Visit    Patient Name: Teresa [name] Date of Encounter: 11/16/2018  Primary Care Provider:  Jerrol Banana., MD Primary Cardiologist:  Kathlyn Sacramento, MD  Chief Complaint    77 year old female with a history of CAD status post CABG x1, hypertension, hyperlipidemia, frequent PVCs, paroxysmal atrial fibrillation, mixed ischemic and nonischemic cardiomyopathy, chronic combined systolic and diastolic congestive heart failure, and chronic pain, who presents for follow-up after recent hospitalization due to recurrent atrial fibrillation.  Past Medical History    Past Medical History:  Diagnosis Date  . Arthritis   . Asthma   . Back pain   . Breast cancer ([place]) 2009   left breast   . CAD (coronary artery disease)    a. 1994 s/p CABG x 1 (LIMA->LAD); b. 03/2015 MV: No ischemia; c. MV 11/18: small fixed apical defect likely secondary to breast attenuation, EF of 42%, frequent PVCs; d. 12/2017 Cath: LM nl, LAD 20p, D1/2/3 nl, LCX min irregs, OM1/2/3 min irregs, RCA nl, RPDA nl, RPL1/2 nl, LIMA->LAD atretic.  Marland Kitchen Chronic combined systolic (congestive) and diastolic (congestive) heart failure ([place])    a. 2013 EF 40%;  b. 03/2015 Echo: EF 55-60%; c. 12/18 Echo: EF of 35-40%; d. 12/2017 TEE: EF 35-40%; e. 03/2018 Echo: EF 40-45%; f. 09/2018 Echo: EF 35%.  . Dental crowns present    caps- left back top, right back bottom  . Gastroesophageal reflux disease   . Hypertension   . MI (myocardial infarction) ([place]) 1994  . Mixed Ischemic & Nonischemic cardiomyopathy    a. 2013 EF 40%;  b. 03/2015 Echo: EF 55-60%; c. 12/18 Echo: EF of 35-40%, hypokinesis of the anteroseptal, and apical myocardium, mild to mod MR, mod dil LA, nl RV fxn, PASP 53 mmHg; d. 12/2017 TEE: EF 35-40%, diff HK, mild to mod MR. small PFO. No LAA/RAA thrombus; e. 03/2018 Echo: EF 40-45%, antsept/inf HK, Gr2 DD, mild MR, mod idl LA, mild to mod TR, PASP 35-71mmHg; f. 09/2018 Echo: EF 35%.  . Persistent atrial fibrillation      a. diagnosed 01/13/2018; b. CHADS2VASc = 6 --> Eliquis; c. 12/2017 s/p TEE/DCCV. Amio started but d/c'd 01/2018 2/2 n/anorexia; d. 10/2018 DCCV-->recurrent Afib w/in days; e. 10/2018 DCCV x 2 in ED->persistent Afib.  . Personal history of radiation therapy   . Psoriasis   . PSVT (paroxysmal supraventricular tachycardia) ([place])    a. 02/2015 Holter: short runs of SVT and NSVT.  Marland Kitchen Pulmonary hypertension ([place])   . PVC's (premature ventricular contractions)    a. 03/2018 24h Holter: Freq PVC's with a total of 421 beats in 24 hrs. 7 short runs of SVT likely representing Afib.  . Rheumatoid arthritis ([facility])    feet, hands  . Vertigo    approx 2x/yr   Past Surgical History:  Procedure Laterality Date  . ABDOMINAL HYSTERECTOMY    . back fusion    . BREAST BIOPSY Right    1991 negative  . BREAST EXCISIONAL BIOPSY Left    2009 positive  . BREAST LUMPECTOMY    . [place] N/A 01/15/2018   Procedure: CARDIOVERSION;  Surgeon: Wellington Hampshire, MD;  Location: [facility];  Service: Cardiovascular;  Laterality: N/A;  . CARDIOVERSION N/A 10/29/2018   Procedure: CARDIOVERSION;  Surgeon: Minna Merritts, MD;  Location: [facility];  Service: Cardiovascular;  Laterality: N/A;  . CHOLECYSTECTOMY    .  CORONARY ARTERY BYPASS GRAFT  1994   1 vessel - [facility]  . FOOT ARTHRODESIS Right 07/24/2016   Procedure: FUSION FIRST METATARSAL CUNEIFORM JOINT RIGHT FOOT, FUSION SECOND METATARSAL CUNEFORM JOINT BUNION REPAIR RIGHT FOOT;  Surgeon: Albertine Patricia, DPM;  Location: [facility];  Service: Podiatry;  Laterality: Right;  . FOOT FRACTURE SURGERY    . HARDWARE REMOVAL Right 07/24/2016   Procedure: REMOVAL HARDWARE LATERAL MALLEOUS RIGHT ANKLE;  Surgeon: Albertine Patricia, DPM;  Location: [facility];  Service: Podiatry;  Laterality: Right;  REMOVAL OF PIN WHICH WAS INTACT  . HERNIA REPAIR    . NECK SURGERY    . RIGHT/LEFT HEART CATH  AND CORONARY/GRAFT ANGIOGRAPHY N/A 01/14/2018   Procedure: LEFT HEART CATH AND CORONARY ANGIOGRAPHY;  Surgeon: Wellington Hampshire, MD;  Location: [facility];  Service: Cardiovascular;  Laterality: N/A;  . TEE WITHOUT CARDIOVERSION N/A 01/15/2018   Procedure: TRANSESOPHAGEAL ECHOCARDIOGRAM (TEE);  Surgeon: Wellington Hampshire, MD;  Location: [facility];  Service: Cardiovascular;  Laterality: N/A;  . TOTAL HIP ARTHROPLASTY Left   . TOTAL KNEE ARTHROPLASTY Left     Allergies  Allergies  Allergen Reactions  . Sulfa Antibiotics Hives  . Ivp Dye [Iodinated Diagnostic Agents] Swelling  . Amiodarone Nausea And Vomiting and Cough  . Codeine Other (See Comments)    ALTERED MENTAL STATUS    History of Present Illness    78 year old female with the above complex past medical history including CAD status post CABG x1 in 1984.  Other history includes mixed ischemic and nonischemic cardiomyopathy, chronic combined systolic and diastolic congestive heart failure, persistent atrial fibrillation status post multiple cardioversions, hypertension, hyperlipidemia, PSVT, frequent PVCs, breast cancer, and chronic pain.  In the late fall 2018, she experienced atypical chest discomfort underwent stress testing which showed a fixed apical defect felt to be secondary to breast attenuation.  EF was 42%.  This was followed by an echocardiogram which confirmed LV dysfunction with an EF of 35 to 40%.  With a prior history of cardiomyopathy and frequent PVCs, she was set up for diagnostic catheterization and Holter monitoring.  When she presented for placement of her Holter monitor in January 2019, she was found to be in rapid atrial fibrillation and she was admitted and placed on amiodarone.  Catheterization during that admission showed an atretic LIMA to the LAD and otherwise nonobstructive disease.  She subsequently underwent TEE and cardioversion at that time.  At discharge, amiodarone was continued however, she had  significant nausea and anorexia which resulted in discontinuation of amiodarone.  She did well throughout the spring and summer 2019 however, she required cervical spine surgery at [facility] in October and postoperatively, she developed recurrent A. fib with RVR and rates in the 120s to 140s.  Metoprolol dose was increased to 100 mg twice daily.  An echocardiogram showed persistent LV dysfunction with an EF of 35%.  She was eventually discharged but was admitted to [facility] October 20 with diffuse, severe pain as well as rapid A. fib and rates in the 140s.  Beta-blocker and Eliquis therapy were continued and cardioversion was deferred in the setting of ongoing pain.  When seen in clinic on October 31, she was noted to have mild volume overload and also rapid rates.  She was placed on low-dose diltiazem for improved rate control.  She was also set up for cardioversion, which took place on November 8.  This was initially successful however, on November 20, she noted recurrent tachycardia,  malaise, and dyspnea and presented to the [facility] ED.  She was cardioverted at 150 J x 2 however had persistent A. fib.  In that setting, she was admitted and again seen by cardiology.  She was placed on long-acting diltiazem therapy and was felt that she would require follow-up in A. fib clinic.  Since her hospitalization, she reports ongoing fatigue but overall, her volume and weight have been stable.  She has not had any chest pain.  She does have dyspnea on exertion but denies PND, orthopnea, dizziness, syncope, edema, or early satiety.  She has had to take short acting diltiazem on a as needed basis in the setting of elevated heart rates that occurred later in the day yesterday.  Rates are better today.  Home Medications    Prior to Admission medications   Medication Sig Start Date End Date Taking? Authorizing Provider  apixaban (ELIQUIS) 5 MG TABS tablet Take 1 tablet (5 mg total) by mouth 2 (two) times daily.  03/05/18  Yes Gollan, Kathlene November, MD  Apremilast (OTEZLA) 30 MG TABS Take 30 mg by mouth 2 (two) times daily.   Yes [provider]  budesonide-formoterol (SYMBICORT) 160-4.5 MCG/ACT inhaler Inhale 2 puffs into the lungs 2 (two) times daily. 10/06/16  Yes Jerrol Banana., MD  diltiazem (CARDIZEM CD) 240 MG 24 hr capsule Take 1 capsule (240 mg total) by mouth daily. 11/13/18 01/12/19 Yes Sainani, Belia Heman, MD  diltiazem (CARDIZEM) 30 MG tablet Take 30 mg by mouth 3 (three) times daily as needed (For a heart rate greater than 100).   Yes [provider]  fluticasone (FLONASE) 50 MCG/ACT nasal spray Place 2 sprays into both nostrils daily as needed for allergies.    Yes [provider]  furosemide (LASIX) 20 MG tablet Take 1 tablet (20 mg total) by mouth daily as needed (shortness of breath). 11/12/18  Yes Gollan, Kathlene November, MD  gabapentin (NEURONTIN) 300 MG capsule Take 600 mg by mouth 3 (three) times daily.    Yes [provider]  meclizine (ANTIVERT) 25 MG tablet Take 25 mg by mouth 3 (three) times daily as needed for dizziness.  04/03/15  Yes [provider]  methocarbamol (ROBAXIN) 750 MG tablet Take 750 mg by mouth 2 (two) times daily.    Yes [provider]  mometasone-formoterol (DULERA) 200-5 MCG/ACT AERO Inhale 2 puffs into the lungs 2 (two) times daily as needed for wheezing or shortness of breath.   Yes [provider]  montelukast (SINGULAIR) 10 MG tablet Take 10 mg by mouth daily as needed (for allergies).  04/02/15  Yes [provider]  Multiple Vitamins-Minerals (MULTIVITAMIN PO) Take 1 tablet by mouth daily.   Yes [provider]  oxycodone (OXY-IR) 5 MG capsule Take 1 capsule (5 mg total) by mouth every 4 (four) hours as needed for pain (Take 1/2 to 1 tablet). Patient taking differently: Take 2.5-5 mg by mouth every 4 (four) hours as needed for pain.  10/08/18  Yes Medina-Vargas, Monina C, NP  Polyethyl  Glycol-Propyl Glycol (SYSTANE OP) Place 1 drop into both eyes daily as needed (dry eyes).   Yes [provider]  rosuvastatin (CRESTOR) 5 MG tablet Take 1 tablet (5 mg total) by mouth daily. 11/01/18  Yes Gollan, Kathlene November, MD  sennosides-docusate sodium (SENOKOT-S) 8.6-50 MG tablet Take 1 tablet by mouth 2 (two) times daily as needed for constipation.    Yes [provider]  venlafaxine (EFFEXOR) 75 MG tablet  Take 75 mg by mouth daily.   Yes [provider]  metoprolol succinate (TOPROL XL) 100 MG 24 hr tablet Take 1 tablet (100 mg total) by mouth 2 (two) times daily. Take with or immediately following a meal. 10/13/18 11/12/18  Saundra Shelling, MD    Review of Systems    Ongoing fatigue in the setting of atrial fibrillation along with dyspnea on exertion and palpitations.  She denies chest pain, PND, orthopnea, dizziness, syncope, edema, or early satiety.  All other systems reviewed and are otherwise negative except as noted above.  Physical Exam    VS:  BP 110/60 (BP Location: Right Arm, Patient Position: Sitting, Cuff Size: Normal)   Pulse 86   Ht 5\' 4"  (1.626 m)   Wt 170 lb (77.1 kg)   BMI 29.18 kg/m  , BMI Body mass index is 29.18 kg/m. GEN: Well nourished, well developed, in no acute distress. HEENT: normal. Neck: Supple, no JVD, carotid bruits, or masses. Cardiac: Irregularly irregular, no murmurs, rubs, or gallops. No clubbing, cyanosis, edema.  Radials/DP/PT 2+ and equal bilaterally.  Respiratory:  Respirations regular and unlabored, clear to auscultation bilaterally. GI: Soft, nontender, nondistended, BS + x 4. MS: no deformity or atrophy. Skin: warm and dry, no rash. Neuro:  Strength and sensation are intact. Psych: Normal affect.  Accessory Clinical Findings    ECG personally reviewed by me today -atrial fibrillation, 86, left axis, left bundle branch block.  QT 422, QTc 504 - no acute changes.  Lab Results  Component Value Date   CREATININE  0.49 11/12/2018   BUN 7 (L) 11/12/2018   NA 143 11/12/2018   K 3.4 (L) 11/12/2018   CL 109 11/12/2018   CO2 26 11/12/2018    Mg 1.9 on 10/12/2018.  Assessment & Plan    1.  Persistent atrial fibrillation: Status post prior cardioversion in January 2019 with amiodarone therapy on board, which was successful however, she could not tolerate amiodarone therapy secondary to nausea and anorexia.  She did well throughout the spring and summer but following a cervical spine surgery at [facility] in October, she had recurrent atrial fibrillation and with that has had intermittent volume overload since.  She has now undergone cardioversion x3 since October 31 and most recently required admission for recurrent A. fib.  During that admission, she was placed on oral diltiazem therapy in addition to beta-blocker and Eliquis.  And since then, her heart rate has been much better, typically in the 80s though she has had a few episodes were rates climbed into the 120s for which, she took a short acting diltiazem 30 mg with improvement.  In the setting of nonischemic cardiomyopathy, we recognize that diltiazem is not ideal however, with her comorbidities, options are limited.  We have arranged for follow-up in the A. fib clinic for consideration of Tikosyn therapy.  Her QTC is widened at 504 however that is in the setting of a left bundle branch block (QT 422).  2.  Mixed ischemic and nonischemic cardiomyopathy: Volume is stable today.  She certainly does better from a heart failure standpoint with improved rate control.  EF was 35% by echo in October.  She remains on beta-blocker therapy.  She had been on ACE inhibitor at one point however this was discontinued in the setting of soft blood pressures previously.  Blood pressure 110/60.  No changes today.  She does have Lasix to be taken as needed but has not required this.  3.  Coronary artery  disease: Status post prior CABG x1 with catheterization this year showing an  atretic LIMA to the LAD with otherwise nonobstructive disease.  She has not had any chest pain.  Continue medical therapy with beta-blocker and statin.  No aspirin in the setting of Eliquis.  4.  Hyperlipidemia: Continue statin therapy.  LDL was 80 in July.  5.  Disposition: We will arrange for follow-up in A. fib clinic for consideration of antiarrhythmic versus ablative options.  Murray Hodgkins, NP 11/16/2018, 5:27 PM

## 2018-11-16 NOTE — Patient Instructions (Signed)
Medication Instructions:  Your physician recommends that you continue on your current medications as directed. Please refer to the Current Medication list given to you today.  If you need a refill on your cardiac medications before your next appointment, please call your pharmacy.   Lab work: none If you have labs (blood work) drawn today and your tests are completely normal, you will receive your results only by: Marland Kitchen MyChart Message (if you have MyChart) OR . A paper copy in the mail If you have any lab test that is abnormal or we need to change your treatment, we will call you to review the results.  Testing/Procedures: none  Follow-Up: You have been referred to Atrial Fibrillation Clinic in [place]. [place], [place] [address] [phone] Parking Code: 8937 for November; Call number listed above for December code number.  At [facility], you and your health needs are our priority.  As part of our continuing mission to provide you with exceptional heart care, we have created designated Provider Care Teams.  These Care Teams include your primary Cardiologist (physician) and Advanced Practice Providers (APPs -  Physician Assistants and Nurse Practitioners) who all work together to provide you with the care you need, when you need it. You will need a follow up appointment in 1 months.   You may see Kathlyn Sacramento, MD or one of the following Advanced Practice Providers on your designated Care Team:   Murray Hodgkins, NP Christell Faith, PA-C . Marrianne Mood, PA-C

## 2018-11-16 NOTE — Telephone Encounter (Signed)
  Dr Fletcher Anon completed and signed application and prescription. Called and s/w daughter and let her know documents were complete. Offered to fax it for her. She was very appreciative for me to do this. Application and associated documents faxed to [place].  Also, let her know patient has appointment with AFib clinic 11/24/18 at 11:30 am and to check with patient's insurance to see if Phyllis Ginger is covered. She verbalized understanding.

## 2018-11-16 NOTE — Telephone Encounter (Signed)
Patient daughter came by office and dropped of paperwork for [facility] forms  Placed in nurses bin

## 2018-11-16 NOTE — Telephone Encounter (Signed)
Placed in Dr Tyrell Antonio in-basket to sign.

## 2018-11-16 NOTE — Telephone Encounter (Signed)
Rescheduled appt to 11/23/2018 @2 :00 pm. Patient was advised.

## 2018-11-17 NOTE — Telephone Encounter (Signed)
Transition Care Management Follow-up Telephone Call  Date of discharge and from where: [facility] on 11/12/18  How have you been since you were released from the hospital? Feeling about the same, still weak. Monitoring heart rate. Heart rate is varying from 81 to the 90s. Declines pain, fever or n/v/d.  Any questions or concerns? No   Items Reviewed:  Did the pt receive and understand the discharge instructions provided? Yes   Medications obtained and verified? Will review at [place] apt.  Any new allergies since your discharge? No   Dietary orders reviewed? Yes  Do you have support at home? Yes   Other (ie: DME, Home Health, etc) N/A  Functional Questionnaire: (I = Independent and D = Dependent)  Bathing/Dressing- I   Meal Prep- Minimum  Eating- I  Maintaining continence- I  Transferring/Ambulation- Uses a walker PRN.  Managing Meds- Daughter assists with medications.   Follow up appointments reviewed:    PCP Hospital f/u appt confirmed? Yes  Scheduled to see Dr [name] on 11/23/18 @ 2:00 PM.  [facility] f/u appt confirmed? Yes    Are transportation arrangements needed? No   If their condition worsens, is the pt aware to call  their PCP or go to the ED? Yes  Was the patient provided with contact information for the PCP's office or ED? Yes  Was the pt encouraged to call back with questions or concerns? Yes

## 2018-11-22 ENCOUNTER — Other Ambulatory Visit: Payer: Self-pay | Admitting: Cardiovascular Disease

## 2018-11-22 DIAGNOSIS — Z981 Arthrodesis status: Secondary | ICD-10-CM | POA: Diagnosis not present

## 2018-11-22 DIAGNOSIS — M542 Cervicalgia: Secondary | ICD-10-CM | POA: Diagnosis not present

## 2018-11-22 MED ORDER — METOPROLOL SUCCINATE ER 100 MG PO TB24: 100.0000 mg | ORAL_TABLET | Freq: Two times a day (BID) | ORAL | 3 refills | Status: DC

## 2018-11-22 NOTE — Telephone Encounter (Signed)
°*  STAT* If patient is at the pharmacy, call can be transferred to refill team.   1. Which medications need to be refilled? (please list name of each medication and dose if known) metoprolol 100 mg po BID   2. Which pharmacy/location (including street and city if local pharmacy) is medication to be sent to? [facility] [address] [place]   3. Do they need a 30 day or 90 day supply? [place]

## 2018-11-23 ENCOUNTER — Ambulatory Visit: Payer: Self-pay | Admitting: Family Medicine

## 2018-11-24 ENCOUNTER — Ambulatory Visit (HOSPITAL_COMMUNITY)
Admission: RE | Admit: 2018-11-24 | Discharge: 2018-11-24 | Disposition: A | Discharge status: Home / Self Care | Payer: Medicare HMO | Source: Ambulatory Visit | Attending: Nurse Practitioner | Admitting: Nurse Practitioner

## 2018-11-24 ENCOUNTER — Encounter (HOSPITAL_COMMUNITY): Payer: Self-pay | Admitting: Nurse Practitioner

## 2018-11-24 VITALS — BP 142/84 | HR 109 | Ht 64.0 in | Wt 167.0 lb

## 2018-11-24 DIAGNOSIS — Z882 Allergy status to sulfonamides status: Secondary | ICD-10-CM | POA: Diagnosis not present

## 2018-11-24 DIAGNOSIS — Z8249 Family history of ischemic heart disease and other diseases of the circulatory system: Secondary | ICD-10-CM | POA: Diagnosis not present

## 2018-11-24 DIAGNOSIS — K219 Gastro-esophageal reflux disease without esophagitis: Secondary | ICD-10-CM | POA: Diagnosis not present

## 2018-11-24 DIAGNOSIS — I4819 Other persistent atrial fibrillation: Secondary | ICD-10-CM | POA: Insufficient documentation

## 2018-11-24 DIAGNOSIS — M069 Rheumatoid arthritis, unspecified: Secondary | ICD-10-CM | POA: Diagnosis not present

## 2018-11-24 DIAGNOSIS — Z87891 Personal history of nicotine dependence: Secondary | ICD-10-CM | POA: Diagnosis not present

## 2018-11-24 DIAGNOSIS — Z7901 Long term (current) use of anticoagulants: Secondary | ICD-10-CM | POA: Insufficient documentation

## 2018-11-24 DIAGNOSIS — I252 Old myocardial infarction: Secondary | ICD-10-CM | POA: Diagnosis not present

## 2018-11-24 DIAGNOSIS — I272 Pulmonary hypertension, unspecified: Secondary | ICD-10-CM | POA: Insufficient documentation

## 2018-11-24 DIAGNOSIS — Z951 Presence of aortocoronary bypass graft: Secondary | ICD-10-CM | POA: Insufficient documentation

## 2018-11-24 DIAGNOSIS — Z853 Personal history of malignant neoplasm of breast: Secondary | ICD-10-CM | POA: Diagnosis not present

## 2018-11-24 DIAGNOSIS — I471 Supraventricular tachycardia: Secondary | ICD-10-CM | POA: Diagnosis not present

## 2018-11-24 DIAGNOSIS — I2510 Atherosclerotic heart disease of native coronary artery without angina pectoris: Secondary | ICD-10-CM | POA: Insufficient documentation

## 2018-11-24 DIAGNOSIS — Z79899 Other long term (current) drug therapy: Secondary | ICD-10-CM | POA: Diagnosis not present

## 2018-11-24 DIAGNOSIS — I5042 Chronic combined systolic (congestive) and diastolic (congestive) heart failure: Secondary | ICD-10-CM | POA: Insufficient documentation

## 2018-11-24 DIAGNOSIS — I110 Hypertensive heart disease with heart failure: Secondary | ICD-10-CM | POA: Diagnosis not present

## 2018-11-24 DIAGNOSIS — Z885 Allergy status to narcotic agent status: Secondary | ICD-10-CM | POA: Diagnosis not present

## 2018-11-24 DIAGNOSIS — J45909 Unspecified asthma, uncomplicated: Secondary | ICD-10-CM | POA: Insufficient documentation

## 2018-11-24 NOTE — Patient Instructions (Signed)
Stop lisinopril  Resume metoprolol at 100mg  twice a day

## 2018-11-25 DIAGNOSIS — M542 Cervicalgia: Secondary | ICD-10-CM | POA: Diagnosis not present

## 2018-11-25 DIAGNOSIS — Z981 Arthrodesis status: Secondary | ICD-10-CM | POA: Diagnosis not present

## 2018-11-25 NOTE — Progress Notes (Signed)
Primary Care Physician: Jerrol Banana., MD Referring Physician: Ignacia Bayley, NP Cardiologist: Dr. Floy Sabina is a 77 y.o. female with a h/o CAD status post CABG x1, hypertension, hyperlipidemia, frequent PVCs, paroxysmal atrial fibrillation, mixed ischemic and nonischemic cardiomyopathy, chronic combined systolic and diastolic congestive heart failure, and chronic pain, who presents for evaluation in the afib clinic for options to restore SR   She first developed afib in January of this year and was discharged on 400 mg bid of amiodarone. She did not tolerate amio in the first week of load with nausea/anorexia and it was stopped.   She had another hospitalization for afib in October at [facility] for cervical spine surgery and [postoperatively developed afib with RVR again. BB was increased.    An echocardiogram showed persistent LV dysfunction with an EF of 35%.  She was eventually discharged but was admitted to [facility] October 20 with diffuse, severe pain as well as rapid A. fib and rates in the 140s.  Beta-blocker and Eliquis therapy were continued and cardioversion was deferred in the setting of ongoing pain.  When seen in clinic on October 31, she was noted to have mild volume overload and also rapid rates.  She was placed on low-dose diltiazem for improved rate control.  She was also set up for cardioversion, which took place on November 8.  This was initially successful however, on November 20, she noted recurrent tachycardia, malaise, and dyspnea and presented to the [facility] ED.  She was cardioverted at 150 J x 2 however had persistent A. fib.  In that setting, she was admitted and again seen by cardiology.  She was placed on long-acting diltiazem therapy and was felt that she would require follow-up in A. fib clinic.  Discussion today re Tikosyn or sotalol. Discussed re challenging  amio at low dose load but pt does not care for this approach. The daughter and pt do  not want to go into the hospital until the first of the year. They feel that she has been in the hospital so much  recently, they just want to take it easy over the holidays. Pt has been off her BB for 4 days as  the refill was not at the drugstore and has RVR today. RX  is now there and they will pick up this pm.  Today, she denies symptoms of palpitations, chest pain,  orthopnea, PND, lower extremity edema, dizziness, presyncope, syncope, or neurologic sequela. + for fatigue and shortness of breath/weakness. The patient is tolerating medications without difficulties and is otherwise without complaint today.   Past Medical History:  Diagnosis Date  . Arthritis   . Asthma   . Back pain   . Breast cancer ([place]) 2009   left breast   . CAD (coronary artery disease)    a. 1994 s/p CABG x 1 (LIMA->LAD); b. 03/2015 MV: No ischemia; c. MV 11/18: small fixed apical defect likely secondary to breast attenuation, EF of 42%, frequent PVCs; d. 12/2017 Cath: LM nl, LAD 20p, D1/2/3 nl, LCX min irregs, OM1/2/3 min irregs, RCA nl, RPDA nl, RPL1/2 nl, LIMA->LAD atretic.  Marland Kitchen Chronic combined systolic (congestive) and diastolic (congestive) heart failure ([facility])    a. 2013 EF 40%;  b. 03/2015 Echo: EF 55-60%; c. 12/18 Echo: EF of 35-40%; d. 12/2017 TEE: EF 35-40%; e. 03/2018 Echo: EF 40-45%; f. 09/2018 Echo: EF 35%.  . Dental crowns present    caps- left back top, right back  bottom  . Gastroesophageal reflux disease   . Hypertension   . MI (myocardial infarction) ([place]) 1994  . Mixed Ischemic & Nonischemic cardiomyopathy    a. 2013 EF 40%;  b. 03/2015 Echo: EF 55-60%; c. 12/18 Echo: EF of 35-40%, hypokinesis of the anteroseptal, and apical myocardium, mild to mod MR, mod dil LA, nl RV fxn, PASP 53 mmHg; d. 12/2017 TEE: EF 35-40%, diff HK, mild to mod MR. small PFO. No LAA/RAA thrombus; e. 03/2018 Echo: EF 40-45%, antsept/inf HK, Gr2 DD, mild MR, mod idl LA, mild to mod TR, PASP 35-66mmHg; f. 09/2018 Echo: EF 35%.  .  Persistent atrial fibrillation    a. diagnosed 01/13/2018; b. CHADS2VASc = 6 --> Eliquis; c. 12/2017 s/p TEE/DCCV. Amio started but d/c'd 01/2018 2/2 n/anorexia; d. 10/2018 DCCV-->recurrent Afib w/in days; e. 10/2018 DCCV x 2 in ED->persistent Afib.  . Personal history of radiation therapy   . Psoriasis   . PSVT (paroxysmal supraventricular tachycardia) ([place])    a. 02/2015 Holter: short runs of SVT and NSVT.  Marland Kitchen Pulmonary hypertension ([place])   . PVC's (premature ventricular contractions)    a. 03/2018 24h Holter: Freq PVC's with a total of 421 beats in 24 hrs. 7 short runs of SVT likely representing Afib.  . Rheumatoid arthritis ([facility])    feet, hands  . Vertigo    approx 2x/yr   Past Surgical History:  Procedure Laterality Date  . ABDOMINAL HYSTERECTOMY    . back fusion    . BREAST BIOPSY Right    1991 negative  . BREAST EXCISIONAL BIOPSY Left    2009 positive  . BREAST LUMPECTOMY    . [place] N/A 01/15/2018   Procedure: CARDIOVERSION;  Surgeon: Wellington Hampshire, MD;  Location: [facility] ORS;  Service: Cardiovascular;  Laterality: N/A;  . CARDIOVERSION N/A 10/29/2018   Procedure: CARDIOVERSION;  Surgeon: Minna Merritts, MD;  Location: [facility] ORS;  Service: Cardiovascular;  Laterality: N/A;  . CHOLECYSTECTOMY    . CORONARY ARTERY BYPASS GRAFT  1994   1 vessel - [facility]  . FOOT ARTHRODESIS Right 07/24/2016   Procedure: FUSION FIRST METATARSAL CUNEIFORM JOINT RIGHT FOOT, FUSION SECOND METATARSAL CUNEFORM JOINT BUNION REPAIR RIGHT FOOT;  Surgeon: Albertine Patricia, DPM;  Location: [facility];  Service: Podiatry;  Laterality: Right;  . FOOT FRACTURE SURGERY    . HARDWARE REMOVAL Right 07/24/2016   Procedure: REMOVAL HARDWARE LATERAL MALLEOUS RIGHT ANKLE;  Surgeon: Albertine Patricia, DPM;  Location: [facility];  Service: Podiatry;  Laterality: Right;  REMOVAL OF PIN WHICH WAS INTACT  . HERNIA REPAIR    . NECK  SURGERY    . RIGHT/LEFT HEART CATH AND CORONARY/GRAFT ANGIOGRAPHY N/A 01/14/2018   Procedure: LEFT HEART CATH AND CORONARY ANGIOGRAPHY;  Surgeon: Wellington Hampshire, MD;  Location: [facility];  Service: Cardiovascular;  Laterality: N/A;  . TEE WITHOUT CARDIOVERSION N/A 01/15/2018   Procedure: TRANSESOPHAGEAL ECHOCARDIOGRAM (TEE);  Surgeon: Wellington Hampshire, MD;  Location: [facility];  Service: Cardiovascular;  Laterality: N/A;  . TOTAL HIP ARTHROPLASTY Left   . TOTAL KNEE ARTHROPLASTY Left     Current Outpatient Medications  Medication Sig Dispense Refill  . apixaban (ELIQUIS) 5 MG TABS tablet Take 1 tablet (5 mg total) by mouth 2 (two) times daily. 180 tablet 3  . Apremilast (OTEZLA) 30 MG TABS Take 30 mg by mouth 2 (two) times daily.    Marland Kitchen  budesonide-formoterol (SYMBICORT) 160-4.5 MCG/ACT inhaler Inhale 2 puffs into the lungs 2 (two) times daily. 1 Inhaler 3  . diltiazem (CARDIZEM CD) 240 MG 24 hr capsule Take 1 capsule (240 mg total) by mouth daily. 30 capsule 1  . fluticasone (FLONASE) 50 MCG/ACT nasal spray Place 2 sprays into both nostrils daily as needed for allergies.     . furosemide (LASIX) 20 MG tablet Take 1 tablet (20 mg total) by mouth daily as needed (shortness of breath). 30 tablet 1  . gabapentin (NEURONTIN) 300 MG capsule Take 600 mg by mouth 3 (three) times daily.     . methocarbamol (ROBAXIN) 750 MG tablet Take 750 mg by mouth 2 (two) times daily.     . metoprolol succinate (TOPROL-XL) 100 MG 24 hr tablet Take 100 mg by mouth 2 (two) times daily. Take with or immediately following a meal.    . mometasone-formoterol (DULERA) 200-5 MCG/ACT AERO Inhale 2 puffs into the lungs 2 (two) times daily as needed for wheezing or shortness of breath.    . montelukast (SINGULAIR) 10 MG tablet Take 10 mg by mouth daily as needed (for allergies).   12  . Multiple Vitamins-Minerals (MULTIVITAMIN PO) Take 1 tablet by mouth daily.    Marland Kitchen oxycodone (OXY-IR) 5 MG capsule Take 1 capsule (5 mg  total) by mouth every 4 (four) hours as needed for pain (Take 1/2 to 1 tablet). (Patient taking differently: Take 2.5-5 mg by mouth every 4 (four) hours as needed for pain. ) 30 capsule 0  . Polyethyl Glycol-Propyl Glycol (SYSTANE OP) Place 1 drop into both eyes daily as needed (dry eyes).    . rosuvastatin (CRESTOR) 5 MG tablet Take 1 tablet (5 mg total) by mouth daily. 90 tablet 2  . sennosides-docusate sodium (SENOKOT-S) 8.6-50 MG tablet Take 1 tablet by mouth 2 (two) times daily as needed for constipation.     Marland Kitchen venlafaxine (EFFEXOR) 75 MG tablet Take 75 mg by mouth daily.    Marland Kitchen diltiazem (CARDIZEM) 30 MG tablet Take 30 mg by mouth 3 (three) times daily as needed (For a heart rate greater than 100).    . meclizine (ANTIVERT) 25 MG tablet Take 25 mg by mouth 3 (three) times daily as needed for dizziness.   1   No current facility-administered medications for this encounter.     Allergies  Allergen Reactions  . Sulfa Antibiotics Hives  . Ivp Dye [Iodinated Diagnostic Agents] Swelling  . Amiodarone Nausea And Vomiting and Cough  . Codeine Other (See Comments)    ALTERED MENTAL STATUS    Social History   Socioeconomic History  . Marital status: Divorced    Spouse name: Not on file  . Number of children: Not on file  . Years of education: Not on file  . Highest education level: Not on file  Occupational History  . Not on file  Social Needs  . Financial resource strain: Not on file  . Food insecurity:    Worry: Not on file    Inability: Not on file  . Transportation needs:    Medical: Not on file    Non-medical: Not on file  Tobacco Use  . Smoking status: Former Smoker    Packs/day: 0.50    Years: 35.00    Pack years: 17.50    Types: Cigarettes  . Smokeless tobacco: Former Systems developer    Quit date: 12/22/1978  . Tobacco comment: Quit approx 1990  Substance and Sexual Activity  . Alcohol use: No  .  Drug use: No  . Sexual activity: Not on file  Lifestyle  . Physical activity:     Days per week: Not on file    Minutes per session: Not on file  . Stress: Not on file  Relationships  . Social connections:    Talks on phone: Not on file    Gets together: Not on file    Attends religious service: Not on file    Active member of club or organization: Not on file    Attends meetings of clubs or organizations: Not on file    Relationship status: Not on file  . Intimate partner violence:    Fear of current or ex partner: Not on file    Emotionally abused: Not on file    Physically abused: Not on file    Forced sexual activity: Not on file  Other Topics Concern  . Not on file  Social History Narrative  . Not on file    Family History  Problem Relation Age of Onset  . Heart attack Father   . Congestive Heart Failure Mother   . Breast cancer Mother 4  . Kidney failure Sister   . Prostate cancer Brother   . Bladder Cancer Brother   . Lung cancer Sister   . Arthritis Sister   . Breast cancer Sister   . Arthritis Sister   . Breast cancer Sister   . Lung cancer Sister   . Lung cancer Brother   . Arthritis Brother     ROS- All systems are reviewed and negative except as per the HPI above  Physical Exam: Vitals:   11/24/18 1126  BP: (!) 142/84  Pulse: (!) 109  Weight: 75.8 kg  Height: 5\' 4"  (1.626 m)   Wt Readings from Last 3 Encounters:  11/24/18 75.8 kg  11/16/18 77.1 kg  11/11/18 81.6 kg    Labs: Lab Results  Component Value Date   NA 143 11/12/2018   K 3.4 (L) 11/12/2018   CL 109 11/12/2018   CO2 26 11/12/2018   GLUCOSE 97 11/12/2018   BUN 7 (L) 11/12/2018   CREATININE 0.49 11/12/2018   CALCIUM 8.4 (L) 11/12/2018   MG 1.9 10/12/2018   Lab Results  Component Value Date   INR 0.99 01/13/2018   Lab Results  Component Value Date   CHOL 157 06/23/2017   HDL 62 06/23/2017   LDLCALC 80 06/23/2017   TRIG 77 06/23/2017     GEN- The patient is well appearing, alert and oriented x 3 today.   Head- normocephalic, atraumatic Eyes-   Sclera clear, conjunctiva pink Ears- hearing intact Oropharynx- clear Neck- supple, no JVP Lymph- no cervical lymphadenopathy Lungs- Clear to ausculation bilaterally, normal work of breathing Heart- Regular rate and rhythm, no murmurs, rubs or gallops, PMI not laterally displaced GI- soft, NT, ND, + BS Extremities- no clubbing, cyanosis, or edema MS- no significant deformity or atrophy Skin- no rash or lesion Psych- euthymic mood, full affect Neuro- strength and sensation are intact  EKG-afib with RVR at 109 bpm Echo-Study Conclusions  - Left ventricle: The cavity size was normal. Wall thickness was   increased in a pattern of mild LVH. Systolic function was mildly   to moderately reduced. The estimated ejection fraction was in the   range of 40% to 45%. There is hypokinesis of the anteroseptal and   inferoseptal myocardium. Features are consistent with a   pseudonormal left ventricular filling pattern, with concomitant   abnormal relaxation and  increased filling pressure (grade 2   diastolic dysfunction). - Mitral valve: There was mild regurgitation. - Left atrium: The atrium was moderately dilated. - Right ventricle: The cavity size was normal. Systolic function   was normal. - Atrial septum: A septal defect cannot be excluded. - Tricuspid valve: There was mild-moderate regurgitation. - Pulmonary arteries: Systolic pressure was mildly increased, in   the range of 35 mm Hg to 40 mm Hg.     Assessment and Plan: 1.Persistent Afib  Long discussion with pt and daughter re options to restore SR She wishes to not try an amiodarone re challenge, dose may have been too high in the past She is favoring dofetilide and will check on cost, sotalol may be an option  qtc is long but LBBB is contributing, I will have Dr. Rayann Heman to review Drugs to be screened by PharmD She is not taking benadryl She will  go by the pharmacy and get back on toprol today, RVR today off since Monday as  drug was not available at pharmacy She wishes to waint until first of the year Explained to pt that they will have to watch carefully and make sure she is not decompensating, if they want to wait a month, daily weights, watch for signs of increased  shortness of breath,PND/orthopnea.  Daughter will be back in touch when they want to schedule hospitalization  Geroge Baseman. Dyneshia Baccam, [facility] [address] [place], [place] [address] [phone]

## 2018-11-27 ENCOUNTER — Ambulatory Visit: Payer: Self-pay | Admitting: Family Medicine

## 2018-11-30 ENCOUNTER — Inpatient Hospital Stay: Payer: Medicare HMO | Admitting: Family Medicine

## 2018-11-30 DIAGNOSIS — Z981 Arthrodesis status: Secondary | ICD-10-CM | POA: Diagnosis not present

## 2018-11-30 DIAGNOSIS — M542 Cervicalgia: Secondary | ICD-10-CM | POA: Diagnosis not present

## 2018-12-02 DIAGNOSIS — Z981 Arthrodesis status: Secondary | ICD-10-CM | POA: Diagnosis not present

## 2018-12-02 DIAGNOSIS — M542 Cervicalgia: Secondary | ICD-10-CM | POA: Diagnosis not present

## 2018-12-04 ENCOUNTER — Other Ambulatory Visit: Payer: Self-pay | Admitting: Cardiovascular Disease

## 2018-12-07 DIAGNOSIS — M542 Cervicalgia: Secondary | ICD-10-CM | POA: Diagnosis not present

## 2018-12-07 DIAGNOSIS — Z981 Arthrodesis status: Secondary | ICD-10-CM | POA: Diagnosis not present

## 2018-12-09 DIAGNOSIS — Z981 Arthrodesis status: Secondary | ICD-10-CM | POA: Diagnosis not present

## 2018-12-09 DIAGNOSIS — M542 Cervicalgia: Secondary | ICD-10-CM | POA: Diagnosis not present

## 2018-12-10 NOTE — Telephone Encounter (Signed)
Scheduled AWV for 01/05/19 @ 2:40 PM. -MM

## 2018-12-13 DIAGNOSIS — M25511 Pain in right shoulder: Secondary | ICD-10-CM | POA: Diagnosis not present

## 2018-12-13 DIAGNOSIS — M7541 Impingement syndrome of right shoulder: Secondary | ICD-10-CM | POA: Diagnosis not present

## 2018-12-14 DIAGNOSIS — M542 Cervicalgia: Secondary | ICD-10-CM | POA: Diagnosis not present

## 2018-12-14 DIAGNOSIS — Z981 Arthrodesis status: Secondary | ICD-10-CM | POA: Diagnosis not present

## 2018-12-16 DIAGNOSIS — Z981 Arthrodesis status: Secondary | ICD-10-CM | POA: Diagnosis not present

## 2018-12-16 DIAGNOSIS — M542 Cervicalgia: Secondary | ICD-10-CM | POA: Diagnosis not present

## 2018-12-17 ENCOUNTER — Encounter: Payer: Self-pay | Admitting: Nurse Practitioner

## 2018-12-17 ENCOUNTER — Ambulatory Visit (INDEPENDENT_AMBULATORY_CARE_PROVIDER_SITE_OTHER): Payer: Medicare HMO | Admitting: Nurse Practitioner

## 2018-12-17 ENCOUNTER — Telehealth: Payer: Self-pay | Admitting: Family Medicine

## 2018-12-17 VITALS — BP 118/72 | HR 77 | Ht 64.0 in | Wt 166.0 lb

## 2018-12-17 DIAGNOSIS — I428 Other cardiomyopathies: Secondary | ICD-10-CM | POA: Diagnosis not present

## 2018-12-17 DIAGNOSIS — I4819 Other persistent atrial fibrillation: Secondary | ICD-10-CM

## 2018-12-17 DIAGNOSIS — I2510 Atherosclerotic heart disease of native coronary artery without angina pectoris: Secondary | ICD-10-CM

## 2018-12-17 DIAGNOSIS — E782 Mixed hyperlipidemia: Secondary | ICD-10-CM | POA: Diagnosis not present

## 2018-12-17 DIAGNOSIS — I5042 Chronic combined systolic (congestive) and diastolic (congestive) heart failure: Secondary | ICD-10-CM

## 2018-12-17 NOTE — Progress Notes (Signed)
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  Past Medical History    Past Medical History:  Diagnosis Date  . Arthritis   . Asthma   . Back pain   . Breast cancer ([place]) 2009   left breast   . CAD (coronary artery disease)    a. 1994 s/p CABG x 1 (LIMA->LAD); b. 03/2015 MV: No ischemia; c. MV 11/18: small fixed apical defect likely secondary to breast attenuation, EF of 42%, frequent PVCs; d. 12/2017 Cath: LM nl, LAD 20p, D1/2/3 nl, LCX min irregs, OM1/2/3 min irregs, RCA nl, RPDA nl, RPL1/2 nl, LIMA->LAD atretic.  Marland Kitchen Chronic combined systolic (congestive) and diastolic (congestive) heart failure ([place])    a. 2013 EF 40%;  b. 03/2015 Echo: EF 55-60%; c. 12/18 Echo: EF of 35-40%; d. 12/2017 TEE: EF 35-40%; e. 03/2018 Echo: EF 40-45%; f. 09/2018 Echo: EF 35%.  . Dental crowns present    caps- left back top, right back bottom  . Gastroesophageal reflux disease   . Hypertension   . MI (myocardial infarction) ([place]) 1994  . Mixed Ischemic & Nonischemic cardiomyopathy    a. 2013 EF 40%;  b. 03/2015 Echo: EF 55-60%; c. 12/18 Echo: EF of 35-40%, hypokinesis of the anteroseptal, and apical myocardium, mild to mod MR, mod dil LA, nl RV fxn, PASP 53 mmHg; d. 12/2017 TEE: EF 35-40%, diff HK, mild to mod MR. small PFO. No LAA/RAA thrombus; e. 03/2018 Echo: EF 40-45%, antsept/inf HK, Gr2 DD, mild MR, mod idl LA, mild to mod TR, PASP 35-19mmHg; f. 09/2018 Echo: EF 35%.  . Persistent atrial fibrillation    a. diagnosed  01/13/2018; b. CHADS2VASc = 6 --> Eliquis; c. 12/2017 s/p TEE/DCCV. Amio started but d/c'd 01/2018 2/2 n/anorexia; d. 10/2018 DCCV-->recurrent Afib w/in days; e. 10/2018 DCCV x 2 in ED->persistent Afib.  . Personal history of radiation therapy   . Psoriasis   . PSVT (paroxysmal supraventricular tachycardia) ([place])    a. 02/2015 Holter: short runs of SVT and NSVT.  Marland Kitchen Pulmonary hypertension ([place])   . PVC's (premature ventricular contractions)    a. 03/2018 24h Holter: Freq PVC's with a total of 421 beats in 24 hrs. 7 short runs of SVT likely representing Afib.  . Rheumatoid arthritis ([facility])    feet, hands  . Vertigo    approx 2x/yr   Past Surgical History:  Procedure Laterality Date  . ABDOMINAL HYSTERECTOMY    . back fusion    . BREAST BIOPSY Right    1991 negative  . BREAST EXCISIONAL BIOPSY Left    2009 positive  . BREAST LUMPECTOMY    . [place] N/A 01/15/2018   Procedure: CARDIOVERSION;  Surgeon: Wellington Hampshire, MD;  Location: [facility];  Service: Cardiovascular;  Laterality: N/A;  . CARDIOVERSION N/A 10/29/2018   Procedure: CARDIOVERSION;  Surgeon: Minna Merritts, MD;  Location: [facility];  Service: Cardiovascular;  Laterality: N/A;  . CHOLECYSTECTOMY    . CORONARY  ARTERY BYPASS GRAFT  1994   1 vessel - [facility]  . FOOT ARTHRODESIS Right 07/24/2016   Procedure: FUSION FIRST METATARSAL CUNEIFORM JOINT RIGHT FOOT, FUSION SECOND METATARSAL CUNEFORM JOINT BUNION REPAIR RIGHT FOOT;  Surgeon: Albertine Patricia, DPM;  Location: [facility];  Service: Podiatry;  Laterality: Right;  . FOOT FRACTURE SURGERY    . HARDWARE REMOVAL Right 07/24/2016   Procedure: REMOVAL HARDWARE LATERAL MALLEOUS RIGHT ANKLE;  Surgeon: Albertine Patricia, DPM;  Location: [facility];  Service: Podiatry;  Laterality: Right;  REMOVAL OF PIN WHICH WAS INTACT  . HERNIA REPAIR    . NECK SURGERY    . RIGHT/LEFT HEART CATH AND CORONARY/GRAFT  ANGIOGRAPHY N/A 01/14/2018   Procedure: LEFT HEART CATH AND CORONARY ANGIOGRAPHY;  Surgeon: Wellington Hampshire, MD;  Location: [place] [facility];  Service: Cardiovascular;  Laterality: N/A;  . TEE WITHOUT CARDIOVERSION N/A 01/15/2018   Procedure: TRANSESOPHAGEAL ECHOCARDIOGRAM (TEE);  Surgeon: Wellington Hampshire, MD;  Location: [facility];  Service: Cardiovascular;  Laterality: N/A;  . TOTAL HIP ARTHROPLASTY Left   . TOTAL KNEE ARTHROPLASTY Left     Allergies  Allergies  Allergen Reactions  . Sulfa Antibiotics Hives  . Ivp Dye [Iodinated Diagnostic Agents] Swelling  . Amiodarone Nausea And Vomiting and Cough  . Codeine Other (See Comments)    ALTERED MENTAL STATUS    History of Present Illness    77 year old female with the above complex past medical history including CAD status post CABG x1 in 1984.  Other history includes mixed ischemic and nonischemic cardiomyopathy, chronic combined systolic and diastolic congestive heart failure, persistent atrial fibrillation status post multiple cardioversions, hypertension, hyperlipidemia, PSVT, frequent PVCs, breast cancer, and chronic pain.  In the late fall 2018, she experienced atypical chest discomfort underwent stress testing which showed a fixed apical defect felt to be secondary to breast attenuation.  EF was 42%.  This was followed by an echocardiogram which confirmed LV dysfunction with an EF of 35 to 40%.  With a prior history of cardiomyopathy and frequent PVCs, she was set up for diagnostic catheterization and Holter monitoring.  When she presented for placement of her Holter monitor in January 2019, she was found to be in rapid atrial fibrillation and she was admitted and placed on amiodarone.  Catheterization during that admission showed an atretic LIMA to the LAD and otherwise nonobstructive disease.  She subsequently underwent TEE and cardioversion at that time.  At discharge, amiodarone was continued however, she had significant nausea and  anorexia which resulted in discontinuation of amiodarone.  She did well throughout the spring and summer 2019 however, she required cervical spine [facility] in October and postoperatively, she developed recurrent A. fib with RVR and rates in the 120s to 140s.    Echocardiogram showed persistent LV dysfunction with an EF of 35%.  She was managed with beta-blocker and later, calcium channel blocker therapy was added for improved rate control.  She subsequently underwent cardioversions on November 8 and again on November 20.  I saw her back in clinic in November 26, at which time she reported ongoing fatigue but overall, weight was stable.  Beta-blocker and calcium channel blocker therapy were continued, in addition to Eliquis, and she was referred to A. fib clinic.  She was seen there December 4, and she is currently considering admission for dofetilide loading.  Since her A. fib clinic visit, she has been mostly stable.  She continues to experience fatigue but volume status was okay  until just prior to Christmas, when she admits to some dietary indiscretion with salt resulting in lower extremity swelling, and required Lasix 20 mg on Christmas day and also on December 26.  With this, swelling has improved.  She does not wear self at home.  She has not been having any chest pain and denies PND, orthopnea, dizziness, syncope, or early satiety.  Home Medications    Prior to Admission medications   Medication Sig Start Date End Date Taking? Authorizing Provider  apixaban (ELIQUIS) 5 MG TABS tablet Take 1 tablet (5 mg total) by mouth 2 (two) times daily. 11/16/18  Yes Wellington Hampshire, MD  Apremilast (OTEZLA) 30 MG TABS Take 30 mg by mouth 2 (two) times daily.   Yes [provider]  budesonide-formoterol (SYMBICORT) 160-4.5 MCG/ACT inhaler Inhale 2 puffs into the lungs 2 (two) times daily. 10/06/16  Yes Jerrol Banana., MD  diltiazem (CARDIZEM CD) 240 MG 24 hr capsule Take 1 capsule (240  mg total) by mouth daily. 11/13/18 01/12/19 Yes Sainani, Belia Heman, MD  diltiazem (CARDIZEM) 30 MG tablet Take 30 mg by mouth 3 (three) times daily as needed (For a heart rate greater than 100).   Yes [provider]  fluticasone (FLONASE) 50 MCG/ACT nasal spray Place 2 sprays into both nostrils daily as needed for allergies.    Yes [provider]  furosemide (LASIX) 20 MG tablet TAKE 1 TABLET (20 MG TOTAL) BY MOUTH DAILY AS NEEDED (SHORTNESS OF BREATH). 12/06/18  Yes Gollan, Kathlene November, MD  gabapentin (NEURONTIN) 300 MG capsule Take 600 mg by mouth 3 (three) times daily.    Yes [provider]  methocarbamol (ROBAXIN) 750 MG tablet Take 750 mg by mouth 2 (two) times daily.    Yes [provider]  metoprolol succinate (TOPROL-XL) 100 MG 24 hr tablet Take 100 mg by mouth 2 (two) times daily. Take with or immediately following a meal.   Yes [provider]  mometasone-formoterol (DULERA) 200-5 MCG/ACT AERO Inhale 2 puffs into the lungs 2 (two) times daily as needed for wheezing or shortness of breath.   Yes [provider]  montelukast (SINGULAIR) 10 MG tablet Take 10 mg by mouth daily as needed (for allergies).  04/02/15  Yes [provider]  Multiple Vitamins-Minerals (MULTIVITAMIN PO) Take 1 tablet by mouth daily.   Yes [provider]  Polyethyl Glycol-Propyl Glycol (SYSTANE OP) Place 1 drop into both eyes daily as needed (dry eyes).   Yes [provider]  rosuvastatin (CRESTOR) 5 MG tablet Take 1 tablet (5 mg total) by mouth daily. 11/01/18  Yes Gollan, Kathlene November, MD  sennosides-docusate sodium (SENOKOT-S) 8.6-50 MG tablet Take 1 tablet by mouth 2 (two) times daily as needed for constipation.    Yes [provider]  venlafaxine (EFFEXOR) 75 MG tablet Take 75 mg by mouth daily.   Yes [provider]    Review of Systems    Mild ankle edema earlier this week which has since resolved.  She continues to  note fatigue and some degree of dyspnea on exertion but overall that has been stable.  She denies chest pain, PND, orthopnea, dizziness, syncope, or early satiety.  All other systems reviewed and are otherwise negative except as noted above.  Physical Exam    VS:  BP 118/72   Pulse 77   Ht 5\' 4"  (1.626 m)   Wt 166 lb (75.3 kg)   SpO2 96%   BMI 28.49 kg/m  ,  BMI Body mass index is 28.49 kg/m. GEN: Well nourished, well developed, in no acute distress. HEENT: normal. Neck: Supple, no JVD, carotid bruits, or masses. Cardiac: Irregularly irregular, no murmurs, rubs, or gallops. No clubbing, cyanosis, edema.  Radials/DP/PT 2+ and equal bilaterally.  Respiratory:  Respirations regular and unlabored, clear to auscultation bilaterally. GI: Soft, nontender, nondistended, BS + x 4. MS: no deformity or atrophy. Skin: warm and dry, no rash. Neuro:  Strength and sensation are intact. Psych: Normal affect.  Accessory Clinical Findings    ECG personally reviewed by me today -atrial fibrillation, 77, left axis deviation, left bundle branch block.  QT 436, QTc 493- no acute changes.  Assessment & Plan    1.  Persistent atrial fibrillation: Patient remains in A. fib and is rate controlled on beta-blocker and calcium channel blocker therapy.  She is anticoagulated with Eliquis.  She has been evaluated in atrial fibrillation clinic and is planning on contacting the team there to arrange for admission at for dofetilide loading but first needs to work out some insurance issues as she is considering changing to a different insurance provider beginning in January but this would result in a higher cost of her dofetilide.  Continue current regimen.  Ultimately, in the setting of nonischemic cardiomyopathy, would like to get her off of calcium channel blocker however right now, it is required for rate control.  2.  Mixed ischemic and nonischemic cardiomyopathy/ chronic combined syst/diast CHF: EF 35% by  echocardiogram in October.  She is euvolemic today but did run into some issues with lower extremity swelling earlier this week in the setting of some dietary/salt indiscretion.  We discussed the importance of daily weights, sodium restriction, medication compliance, and symptom reporting and she verbalizes understanding.  She does have a scale at home but had not been using it.  I advised that she weigh daily.  We looked at her weights over the past year and noted that her weight today-166 pounds, is the lowest that she has been in the past year.  We should use this as her dry weight and she now understands that she will take Lasix as needed for objective increases in weight (2 pounds in 24 hours or 5 pounds over the course of a week), instead of eyeballing her ankle edema.  Heart rate and blood pressure well controlled today.  3.  Coronary artery disease: Status post prior CABG x1 with catheterization earlier this year showing an atretic LIMA to the LAD with otherwise nonobstructive disease.  She has not been having any chest pain.  She remains on beta-blocker and statin therapy.  No aspirin in the setting of Eliquis.  4.  Hyperlipidemia: Continue statin therapy.  LDL was 80 in July.  5.  Disposition: Patient will contact A. fib clinic early in January to arrange for dofetilide loading/hospitalization.  We will plan to see her back here in 2 months or sooner if necessary.  Murray Hodgkins, NP 12/17/2018, 3:49 PM

## 2018-12-17 NOTE — Patient Instructions (Signed)
Medication Instructions:  Your physician recommends that you continue on your current medications as directed. Please refer to the Current Medication list given to you today.  If you need a refill on your cardiac medications before your next appointment, please call your pharmacy.   Lab work: None ordered  If you have labs (blood work) drawn today and your tests are completely normal, you will receive your results only by: Marland Kitchen MyChart Message (if you have MyChart) OR . A paper copy in the mail If you have any lab test that is abnormal or we need to change your treatment, we will call you to review the results.  Testing/Procedures: None ordered   Follow-Up: At [facility], you and your health needs are our priority.  As part of our continuing mission to provide you with exceptional heart care, we have created designated Provider Care Teams.  These Care Teams include your primary Cardiologist (physician) and Advanced Practice Providers (APPs -  Physician Assistants and Nurse Practitioners) who all work together to provide you with the care you need, when you need it. You will need a follow up appointment in 2 months.  You may see Kathlyn Sacramento, MD or one of the following Advanced Practice Providers on your designated Care Team:   Murray Hodgkins, NP Christell Faith, PA-C . Marrianne Mood, PA-C  Any Other Special Instructions Will Be Listed Below (If Applicable). Please follow up with A fib clinic as previously scheduled.

## 2018-12-17 NOTE — Telephone Encounter (Signed)
Pt needing a call back regarding her pills and food will not go down when she swallows.  She noticed this the other night when she took her medication.  It stayed in her throat and burned her throat during the night.  Please call pt back to discuss asap.  Thanks, [employer]

## 2018-12-21 NOTE — Telephone Encounter (Signed)
Spoke with patient and she reports that she went to the pharmacy, and started taking omeprazole. She is feeling much better. I apologized for the delay in her message.

## 2018-12-23 DIAGNOSIS — Z981 Arthrodesis status: Secondary | ICD-10-CM | POA: Diagnosis not present

## 2018-12-23 DIAGNOSIS — M542 Cervicalgia: Secondary | ICD-10-CM | POA: Diagnosis not present

## 2018-12-28 DIAGNOSIS — Z981 Arthrodesis status: Secondary | ICD-10-CM | POA: Diagnosis not present

## 2018-12-28 DIAGNOSIS — M542 Cervicalgia: Secondary | ICD-10-CM | POA: Diagnosis not present

## 2018-12-30 DIAGNOSIS — Z981 Arthrodesis status: Secondary | ICD-10-CM | POA: Diagnosis not present

## 2018-12-30 DIAGNOSIS — M542 Cervicalgia: Secondary | ICD-10-CM | POA: Diagnosis not present

## 2018-12-31 DIAGNOSIS — I4891 Unspecified atrial fibrillation: Secondary | ICD-10-CM | POA: Diagnosis not present

## 2018-12-31 DIAGNOSIS — I493 Ventricular premature depolarization: Secondary | ICD-10-CM | POA: Diagnosis not present

## 2018-12-31 DIAGNOSIS — I4819 Other persistent atrial fibrillation: Secondary | ICD-10-CM | POA: Diagnosis not present

## 2018-12-31 DIAGNOSIS — R55 Syncope and collapse: Secondary | ICD-10-CM | POA: Diagnosis not present

## 2019-01-05 ENCOUNTER — Telehealth: Payer: Self-pay | Admitting: Cardiovascular Disease

## 2019-01-05 ENCOUNTER — Ambulatory Visit (INDEPENDENT_AMBULATORY_CARE_PROVIDER_SITE_OTHER): Payer: Medicare HMO

## 2019-01-05 ENCOUNTER — Other Ambulatory Visit: Payer: Self-pay | Admitting: *Deleted

## 2019-01-05 VITALS — BP 112/62 | HR 81 | Temp 98.5°F | Ht 64.0 in | Wt 162.0 lb

## 2019-01-05 DIAGNOSIS — Z0000 Encounter for general adult medical examination without abnormal findings: Secondary | ICD-10-CM | POA: Diagnosis not present

## 2019-01-05 MED ORDER — DILTIAZEM HCL ER COATED BEADS 240 MG PO CP24: 240.0000 mg | ORAL_CAPSULE | Freq: Every day | ORAL | 1 refills | Status: DC

## 2019-01-05 NOTE — Patient Instructions (Addendum)
Teresa [name] , Thank you for taking time to come for your Medicare Wellness Visit. I appreciate your ongoing commitment to your health goals. Please review the following plan we discussed and let me know if I can assist you in the future.   Screening recommendations/referrals: Colonoscopy: No longer required.  Mammogram: No longer required.  Bone Density: Up to date, due 09/2022 Recommended yearly ophthalmology/optometry visit for glaucoma screening and checkup Recommended yearly dental visit for hygiene and checkup  Vaccinations: Influenza vaccine: Up to date Pneumococcal vaccine: Completed series Tdap vaccine: Pt declines today.  Shingles vaccine: Pt declines today.     Advanced directives: Please bring a copy of your POA (Power of Attorney) and/or Living Will to your next appointment.   Conditions/risks identified: Continue trying to increase water intake to 6-8 glasses of water a day.   Next appointment: 02/16/19 @ 2:00 PM with Dr [name].   Preventive Care 34 Years and Older, Female Preventive care refers to lifestyle choices and visits with your health care provider that can promote health and wellness. What does preventive care include?  A yearly physical exam. This is also called an annual well check.  Dental exams once or twice a year.  Routine eye exams. Ask your health care provider how often you should have your eyes checked.  Personal lifestyle choices, including:  Daily care of your teeth and gums.  Regular physical activity.  Eating a healthy diet.  Avoiding tobacco and drug use.  Limiting alcohol use.  Practicing safe sex.  Taking low-dose aspirin every day.  Taking vitamin and mineral supplements as recommended by your health care provider. What happens during an annual well check? The services and screenings done by your health care provider during your annual well check will depend on your age, overall health, lifestyle risk factors, and family  history of disease. Counseling  Your health care provider may ask you questions about your:  Alcohol use.  Tobacco use.  Drug use.  Emotional well-being.  Home and relationship well-being.  Sexual activity.  Eating habits.  History of falls.  Memory and ability to understand (cognition).  Work and work Statistician.  Reproductive health. Screening  You may have the following tests or measurements:  Height, weight, and BMI.  Blood pressure.  Lipid and cholesterol levels. These may be checked every 5 years, or more frequently if you are over 68 years old.  Skin check.  Lung cancer screening. You may have this screening every year starting at age 20 if you have a 30-pack-year history of smoking and currently smoke or have quit within the past 15 years.  Fecal occult blood test (FOBT) of the stool. You may have this test every year starting at age 71.  Flexible sigmoidoscopy or colonoscopy. You may have a sigmoidoscopy every 5 years or a colonoscopy every 10 years starting at age 73.  Hepatitis C blood test.  Hepatitis B blood test.  Sexually transmitted disease (STD) testing.  Diabetes screening. This is done by checking your blood sugar (glucose) after you have not eaten for a while (fasting). You may have this done every 1-3 years.  Bone density scan. This is done to screen for osteoporosis. You may have this done starting at age 20.  Mammogram. This may be done every 1-2 years. Talk to your health care provider about how often you should have regular mammograms. Talk with your health care provider about your test results, treatment options, and if necessary, the need for more tests.  Vaccines  Your health care provider may recommend certain vaccines, such as:  Influenza vaccine. This is recommended every year.  Tetanus, diphtheria, and acellular pertussis (Tdap, Td) vaccine. You may need a Td booster every 10 years.  Zoster vaccine. You may need this after  age 23.  Pneumococcal 13-valent conjugate (PCV13) vaccine. One dose is recommended after age 39.  Pneumococcal polysaccharide (PPSV23) vaccine. One dose is recommended after age 33. Talk to your health care provider about which screenings and vaccines you need and how often you need them. This information is not intended to replace advice given to you by your health care provider. Make sure you discuss any questions you have with your health care provider. Document Released: 01/04/2016 Document Revised: 08/27/2016 Document Reviewed: 10/09/2015 Elsevier Interactive Patient Education  2017 [place] Prevention in the Home Falls can cause injuries. They can happen to people of all ages. There are many things you can do to make your home safe and to help prevent falls. What can I do on the outside of my home?  Regularly fix the edges of walkways and driveways and fix any cracks.  Remove anything that might make you trip as you walk through a door, such as a raised step or threshold.  Trim any bushes or trees on the path to your home.  Use bright outdoor lighting.  Clear any walking paths of anything that might make someone trip, such as rocks or tools.  Regularly check to see if handrails are loose or broken. Make sure that both sides of any steps have handrails.  Any raised decks and porches should have guardrails on the edges.  Have any leaves, snow, or ice cleared regularly.  Use sand or salt on walking paths during winter.  Clean up any spills in your garage right away. This includes oil or grease spills. What can I do in the bathroom?  Use night lights.  Install grab bars by the toilet and in the tub and shower. Do not use towel bars as grab bars.  Use non-skid mats or decals in the tub or shower.  If you need to sit down in the shower, use a plastic, non-slip stool.  Keep the floor dry. Clean up any water that spills on the floor as soon as it  happens.  Remove soap buildup in the tub or shower regularly.  Attach bath mats securely with double-sided non-slip rug tape.  Do not have throw rugs and other things on the floor that can make you trip. What can I do in the bedroom?  Use night lights.  Make sure that you have a light by your bed that is easy to reach.  Do not use any sheets or blankets that are too big for your bed. They should not hang down onto the floor.  Have a firm chair that has side arms. You can use this for support while you get dressed.  Do not have throw rugs and other things on the floor that can make you trip. What can I do in the kitchen?  Clean up any spills right away.  Avoid walking on wet floors.  Keep items that you use a lot in easy-to-reach places.  If you need to reach something above you, use a strong step stool that has a grab bar.  Keep electrical cords out of the way.  Do not use floor polish or wax that makes floors slippery. If you must use wax, use non-skid floor wax.  Do not have throw rugs and other things on the floor that can make you trip. What can I do with my stairs?  Do not leave any items on the stairs.  Make sure that there are handrails on both sides of the stairs and use them. Fix handrails that are broken or loose. Make sure that handrails are as long as the stairways.  Check any carpeting to make sure that it is firmly attached to the stairs. Fix any carpet that is loose or worn.  Avoid having throw rugs at the top or bottom of the stairs. If you do have throw rugs, attach them to the floor with carpet tape.  Make sure that you have a light switch at the top of the stairs and the bottom of the stairs. If you do not have them, ask someone to add them for you. What else can I do to help prevent falls?  Wear shoes that:  Do not have high heels.  Have rubber bottoms.  Are comfortable and fit you well.  Are closed at the toe. Do not wear sandals.  If you  use a stepladder:  Make sure that it is fully opened. Do not climb a closed stepladder.  Make sure that both sides of the stepladder are locked into place.  Ask someone to hold it for you, if possible.  Clearly mark and make sure that you can see:  Any grab bars or handrails.  First and last steps.  Where the edge of each step is.  Use tools that help you move around (mobility aids) if they are needed. These include:  Canes.  Walkers.  Scooters.  Crutches.  Turn on the lights when you go into a dark area. Replace any light bulbs as soon as they burn out.  Set up your furniture so you have a clear path. Avoid moving your furniture around.  If any of your floors are uneven, fix them.  If there are any pets around you, be aware of where they are.  Review your medicines with your doctor. Some medicines can make you feel dizzy. This can increase your chance of falling. Ask your doctor what other things that you can do to help prevent falls. This information is not intended to replace advice given to you by your health care provider. Make sure you discuss any questions you have with your health care provider. Document Released: 10/04/2009 Document Revised: 05/15/2016 Document Reviewed: 01/12/2015 Elsevier Interactive Patient Education  2017 [employer].

## 2019-01-05 NOTE — Telephone Encounter (Signed)
Requested Prescriptions   Signed Prescriptions Disp Refills  . diltiazem (CARDIZEM CD) 240 MG 24 hr capsule 30 capsule 1    Sig: Take 1 capsule (240 mg total) by mouth daily.    Authorizing Provider: Kathlyn Sacramento A    Ordering User: Britt Bottom

## 2019-01-05 NOTE — Progress Notes (Signed)
Subjective:   Teresa [name] is a 78 y.o. female who presents for Medicare Annual (Subsequent) preventive examination.  Review of Systems:  N/A  Cardiac Risk Factors include: advanced age (>15men, >2 women);dyslipidemia     Objective:     Vitals: BP 112/62   Pulse 81   Temp 98.5 F (36.9 C) (Oral)   Ht 5\' 4"  (1.626 m)   Wt 162 lb (73.5 kg)   BMI 27.81 kg/m   Body mass index is 27.81 kg/m.  Advanced Directives 01/05/2019 11/11/2018 10/29/2018 10/10/2018 10/10/2018 01/15/2018 01/13/2018  Does Patient Have a Medical Advance Directive? Yes Yes Yes Yes Yes Yes Yes  Type of Advance Directive Living will;Healthcare Power of [place];Living will [place];Living will [place];Living will [place];Living will Healthcare Power of [place];Living will  Does patient want to make changes to medical advance directive? - No - Patient declined No - Patient declined No - Patient declined - No - Patient declined No - Patient declined  Copy of [place] in Chart? No - copy requested No - copy requested Yes - validated most recent copy scanned in chart (See row information) No - copy requested - No - copy requested No - copy requested    Tobacco Social History   Tobacco Use  Smoking Status Former Smoker  . Packs/day: 0.50  . Years: 35.00  . Pack years: 17.50  . Types: Cigarettes  Smokeless Tobacco Never Used  Tobacco Comment   Quit approx 1990     Counseling given: Not Answered Comment: Quit approx 1990   Clinical Intake:  Pre-visit preparation completed: Yes  Pain : 0-10 Pain Score: 9  Pain Type: Chronic pain Pain Location: Back Pain Orientation: (all over) Pain Descriptors / Indicators: Aching Pain Frequency: Constant     Nutritional Status: BMI 25 -29 Overweight Nutritional Risks: None Diabetes: No  How often do you need to have  someone help you when you read instructions, pamphlets, or other written materials from your doctor or pharmacy?: 1 - Never  Interpreter Needed?: No  Information entered by :: [facility], LPN  Past Medical History:  Diagnosis Date  . Arthritis   . Asthma   . Back pain   . Breast cancer ([place]) 2009   left breast   . CAD (coronary artery disease)    a. 1994 s/p CABG x 1 (LIMA->LAD); b. 03/2015 MV: No ischemia; c. MV 11/18: small fixed apical defect likely secondary to breast attenuation, EF of 42%, frequent PVCs; d. 12/2017 Cath: LM nl, LAD 20p, D1/2/3 nl, LCX min irregs, OM1/2/3 min irregs, RCA nl, RPDA nl, RPL1/2 nl, LIMA->LAD atretic.  Marland Kitchen Chronic combined systolic (congestive) and diastolic (congestive) heart failure ([place])    a. 2013 EF 40%;  b. 03/2015 Echo: EF 55-60%; c. 12/18 Echo: EF of 35-40%; d. 12/2017 TEE: EF 35-40%; e. 03/2018 Echo: EF 40-45%; f. 09/2018 Echo: EF 35%.  . Dental crowns present    caps- left back top, right back bottom  . Gastroesophageal reflux disease   . Hypertension   . MI (myocardial infarction) ([place]) 1994  . Mixed Ischemic & Nonischemic cardiomyopathy    a. 2013 EF 40%;  b. 03/2015 Echo: EF 55-60%; c. 12/18 Echo: EF of 35-40%, hypokinesis of the anteroseptal, and apical myocardium, mild to mod MR, mod dil LA, nl RV fxn, PASP 53 mmHg; d. 12/2017 TEE: EF 35-40%, diff HK, mild to mod  MR. small PFO. No LAA/RAA thrombus; e. 03/2018 Echo: EF 40-45%, antsept/inf HK, Gr2 DD, mild MR, mod idl LA, mild to mod TR, PASP 35-71mmHg; f. 09/2018 Echo: EF 35%.  . Persistent atrial fibrillation    a. diagnosed 01/13/2018; b. CHADS2VASc = 6 --> Eliquis; c. 12/2017 s/p TEE/DCCV. Amio started but d/c'd 01/2018 2/2 n/anorexia; d. 10/2018 DCCV-->recurrent Afib w/in days; e. 10/2018 DCCV x 2 in ED->persistent Afib.  . Personal history of radiation therapy   . Psoriasis   . PSVT (paroxysmal supraventricular tachycardia) ([place])    a. 02/2015 Holter: short runs of SVT and NSVT.  Marland Kitchen Pulmonary  hypertension ([place])   . PVC's (premature ventricular contractions)    a. 03/2018 24h Holter: Freq PVC's with a total of 421 beats in 24 hrs. 7 short runs of SVT likely representing Afib.  . Rheumatoid arthritis ([facility])    feet, hands  . Vertigo    approx 2x/yr   Past Surgical History:  Procedure Laterality Date  . ABDOMINAL HYSTERECTOMY    . back fusion    . BREAST BIOPSY Right    1991 negative  . BREAST EXCISIONAL BIOPSY Left    2009 positive  . BREAST LUMPECTOMY    . [place] N/A 01/15/2018   Procedure: CARDIOVERSION;  Surgeon: Wellington Hampshire, MD;  Location: [facility];  Service: Cardiovascular;  Laterality: N/A;  . CARDIOVERSION N/A 10/29/2018   Procedure: CARDIOVERSION;  Surgeon: Minna Merritts, MD;  Location: [facility];  Service: Cardiovascular;  Laterality: N/A;  . CHOLECYSTECTOMY    . CORONARY ARTERY BYPASS GRAFT  1994   1 vessel - [facility]  . FOOT ARTHRODESIS Right 07/24/2016   Procedure: FUSION FIRST METATARSAL CUNEIFORM JOINT RIGHT FOOT, FUSION SECOND METATARSAL CUNEFORM JOINT BUNION REPAIR RIGHT FOOT;  Surgeon: Albertine Patricia, DPM;  Location: [place];  Service: Podiatry;  Laterality: Right;  . FOOT FRACTURE SURGERY    . HARDWARE REMOVAL Right 07/24/2016   Procedure: REMOVAL HARDWARE LATERAL MALLEOUS RIGHT ANKLE;  Surgeon: Albertine Patricia, DPM;  Location: [place];  Service: Podiatry;  Laterality: Right;  REMOVAL OF PIN WHICH WAS INTACT  . HERNIA REPAIR    . NECK SURGERY    . RIGHT/LEFT HEART CATH AND CORONARY/GRAFT ANGIOGRAPHY N/A 01/14/2018   Procedure: LEFT HEART CATH AND CORONARY ANGIOGRAPHY;  Surgeon: Wellington Hampshire, MD;  Location: [facility];  Service: Cardiovascular;  Laterality: N/A;  . TEE WITHOUT CARDIOVERSION N/A 01/15/2018   Procedure: TRANSESOPHAGEAL ECHOCARDIOGRAM (TEE);  Surgeon: Wellington Hampshire, MD;  Location: [facility];  Service: Cardiovascular;   Laterality: N/A;  . TOTAL HIP ARTHROPLASTY Left   . TOTAL KNEE ARTHROPLASTY Left    Family History  Problem Relation Age of Onset  . Heart attack Father   . Congestive Heart Failure Mother   . Breast cancer Mother 39  . Kidney failure Sister   . Prostate cancer Brother   . Bladder Cancer Brother   . Lung cancer Sister   . Arthritis Sister   . Breast cancer Sister   . Arthritis Sister   . Breast cancer Sister   . Lung cancer Sister   . Lung cancer Brother   . Arthritis Brother    Social History   Socioeconomic History  . Marital status: Divorced    Spouse name: Not on file  . Number of children: 2  . Years of education: Not  on file  . Highest education level: 11th grade  Occupational History  . Occupation: retired  Scientific laboratory technician  . Financial resource strain: Not hard at all  . Food insecurity:    Worry: Never true    Inability: Never true  . Transportation needs:    Medical: No    Non-medical: No  Tobacco Use  . Smoking status: Former Smoker    Packs/day: 0.50    Years: 35.00    Pack years: 17.50    Types: Cigarettes  . Smokeless tobacco: Never Used  . Tobacco comment: Quit approx 1990  Substance and Sexual Activity  . Alcohol use: No  . Drug use: No  . Sexual activity: Not on file  Lifestyle  . Physical activity:    Days per week: 0 days    Minutes per session: 0 min  . Stress: Only a little  Relationships  . Social connections:    Talks on phone: Patient refused    Gets together: Patient refused    Attends religious service: Patient refused    Active member of club or organization: Patient refused    Attends meetings of clubs or organizations: Patient refused    Relationship status: Patient refused  Other Topics Concern  . Not on file  Social History Narrative  . Not on file    Outpatient Encounter Medications as of 01/05/2019  Medication Sig  . apixaban (ELIQUIS) 5 MG TABS tablet Take 1 tablet (5 mg total) by mouth 2 (two) times daily.  Marland Kitchen  Apremilast (OTEZLA) 30 MG TABS Take 30 mg by mouth 2 (two) times daily.  . budesonide-formoterol (SYMBICORT) 160-4.5 MCG/ACT inhaler Inhale 2 puffs into the lungs 2 (two) times daily. (Patient taking differently: Inhale 2 puffs into the lungs 2 (two) times daily. As needed)  . diltiazem (CARDIZEM CD) 240 MG 24 hr capsule Take 1 capsule (240 mg total) by mouth daily.  Marland Kitchen diltiazem (CARDIZEM) 30 MG tablet Take 30 mg by mouth 3 (three) times daily as needed (For a heart rate greater than 100).  . fluticasone (FLONASE) 50 MCG/ACT nasal spray Place 2 sprays into both nostrils daily as needed for allergies.   . furosemide (LASIX) 20 MG tablet TAKE 1 TABLET (20 MG TOTAL) BY MOUTH DAILY AS NEEDED (SHORTNESS OF BREATH).  Marland Kitchen gabapentin (NEURONTIN) 300 MG capsule Take 600 mg by mouth 3 (three) times daily.   . methocarbamol (ROBAXIN) 750 MG tablet Take 750 mg by mouth 2 (two) times daily.   . metoprolol succinate (TOPROL-XL) 100 MG 24 hr tablet Take 100 mg by mouth 2 (two) times daily. Take with or immediately following a meal.  . mometasone-formoterol (DULERA) 200-5 MCG/ACT AERO Inhale 2 puffs into the lungs 2 (two) times daily as needed for wheezing or shortness of breath.  . montelukast (SINGULAIR) 10 MG tablet Take 10 mg by mouth daily as needed (for allergies).   . Multiple Vitamins-Minerals (MULTIVITAMIN PO) Take 1 tablet by mouth daily.  Vladimir Faster Glycol-Propyl Glycol (SYSTANE OP) Place 1 drop into both eyes daily as needed (dry eyes).  . rosuvastatin (CRESTOR) 5 MG tablet Take 1 tablet (5 mg total) by mouth daily.  . sennosides-docusate sodium (SENOKOT-S) 8.6-50 MG tablet Take 1 tablet by mouth 2 (two) times daily as needed for constipation.   Marland Kitchen venlafaxine (EFFEXOR) 75 MG tablet Take 75 mg by mouth daily.  . [DISCONTINUED] diltiazem (CARDIZEM CD) 240 MG 24 hr capsule Take 1 capsule (240 mg total) by mouth daily.   No  facility-administered encounter medications on file as of 01/05/2019.      Activities of Daily Living In your present state of health, do you have any difficulty performing the following activities: 01/05/2019 11/11/2018  Hearing? N N  Vision? N N  Difficulty concentrating or making decisions? N N  Walking or climbing stairs? Y N  Comment Due to back pains.  -  Dressing or bathing? N N  Doing errands, shopping? N N  Preparing Food and eating ? N -  Using the Toilet? N -  In the past six months, have you accidently leaked urine? N -  Do you have problems with loss of bowel control? N -  Managing your Medications? N -  Managing your Finances? N -  Housekeeping or managing your Housekeeping? N -  Some recent data might be hidden    Patient Care Team: Jerrol Banana., MD as PCP - General (Family Medicine) Wellington Hampshire, MD as PCP - Cardiology (Cardiology) Dasher, Rayvon Char, MD as Consulting Physician (Dermatology) Blanche East, MD as Consulting Physician (Neurosurgery) Estill Cotta, MD (Ophthalmology)    Assessment:   This is a routine wellness examination for Raeshawn.  Exercise Activities and Dietary recommendations Current Exercise Habits: The patient does not participate in regular exercise at present, Exercise limited by: orthopedic condition(s)  Goals    . Increase water intake     Recommend to increase fluid intake from 2 - 16 oz water bottles every day to 4 - 16 oz water bottles every day  01/05/19- Recommend to drink at least 6-8 8oz glasses of water per day.       Fall Risk Fall Risk  01/05/2019 09/16/2017 10/06/2016 10/04/2015 07/16/2015  Falls in the past year? 0 No No No No   FALL RISK PREVENTION PERTAINING TO THE HOME:  Any stairs in or around the home WITH handrails? No  Home free of loose throw rugs in walkways, pet beds, electrical cords, etc? Yes  Adequate lighting in your home to reduce risk of falls? Yes   ASSISTIVE DEVICES UTILIZED TO PREVENT FALLS:  Life alert? No  Use of a cane, walker or w/c? No   Grab bars in the bathroom? Yes  Shower chair or bench in shower? No  Elevated toilet seat or a handicapped toilet? Yes    TIMED UP AND GO:  Was the test performed? No .    Depression Screen PHQ 2/9 Scores 01/05/2019 01/05/2019 10/22/2017 09/16/2017  PHQ - 2 Score 0 0 1 0  PHQ- 9 Score 4 - 7 -     Cognitive Function     6CIT Screen 01/05/2019 09/16/2017  What Year? 0 points 0 points  What month? 0 points 0 points  What time? 0 points 0 points  Count back from 20 0 points 0 points  Months in reverse 0 points 0 points  Repeat phrase 0 points 0 points  Total Score 0 0    Immunization History  Administered Date(s) Administered  . Influenza Whole 08/22/2018  . Influenza, High Dose Seasonal PF 10/04/2015, 10/06/2016, 09/16/2017, 08/31/2018  . Influenza-Unspecified 07/22/2014  . Pneumococcal Conjugate-13 10/06/2016    Qualifies for Shingles Vaccine? Yes . Due for Shingrix. Education has been provided regarding the importance of this vaccine. Pt has been advised to call insurance company to determine out of pocket expense. Advised may also receive vaccine at local pharmacy or Health Dept. Verbalized acceptance and understanding.  Tdap: Although this vaccine is not a covered service during a  Wellness Exam, does the patient still wish to receive this vaccine today?  No .  Education has been provided regarding the importance of this vaccine. Advised may receive this vaccine at local pharmacy or Health Dept. Aware to provide a copy of the vaccination record if obtained from local pharmacy or Health Dept. Verbalized acceptance and understanding.  Flu Vaccine: Up to date  Pneumococcal Vaccine: Up to date   Screening Tests Health Maintenance  Topic Date Due  . TETANUS/TDAP  08/09/2018  . DEXA SCAN  09/30/2022  . INFLUENZA VACCINE  Completed  . PNA vac Low Risk Adult  Completed    Cancer Screenings:  Colorectal Screening: No longer required.   Mammogram: No longer required.    Bone Density: Completed 09/30/17. Results reflect OSTEOPENIA. Repeat every 5 years.   Lung Cancer Screening: (Low Dose CT Chest recommended if Age 32-80 years, 30 pack-year currently smoking OR have quit w/in 15years.) does not qualify.    Additional Screening:  Vision Screening: Recommended annual ophthalmology exams for early detection of glaucoma and other disorders of the eye.  Dental Screening: Recommended annual dental exams for proper oral hygiene  Community Resource Referral:  CRR required this visit?  No       Plan:  I have personally reviewed and addressed the Medicare Annual Wellness questionnaire and have noted the following in the patient's chart:  A. Medical and social history B. Use of alcohol, tobacco or illicit drugs  C. Current medications and supplements D. Functional ability and status E.  Nutritional status F.  Physical activity G. Advance directives H. List of other physicians I.  Hospitalizations, surgeries, and ER visits in previous 12 months J.  [place] such as hearing and vision if needed, cognitive and depression L. Referrals and appointments - none  In addition, I have reviewed and discussed with patient certain preventive protocols, quality metrics, and best practice recommendations. A written personalized care plan for preventive services as well as general preventive health recommendations were provided to patient.  See attached scanned questionnaire for additional information.   Signed,  Fabio Neighbors, LPN Nurse Health Advisor   Nurse Recommendations: Pt declined the tetanus vaccine today.

## 2019-01-05 NOTE — Telephone Encounter (Signed)
°*  STAT* If patient is at the pharmacy, call can be transferred to refill team.   1. Which medications need to be refilled? (please list name of each medication and dose if known) diltiazem 240 MG 1 capsule daily   2. Which pharmacy/location (including street and city if local pharmacy) is medication to be sent to? [facility] [place]   3. Do they need a 30 day or 90 day supply? 30 day

## 2019-01-10 DIAGNOSIS — R768 Other specified abnormal immunological findings in serum: Secondary | ICD-10-CM | POA: Diagnosis not present

## 2019-01-10 DIAGNOSIS — L4050 Arthropathic psoriasis, unspecified: Secondary | ICD-10-CM | POA: Diagnosis not present

## 2019-01-10 DIAGNOSIS — Z79899 Other long term (current) drug therapy: Secondary | ICD-10-CM | POA: Diagnosis not present

## 2019-01-10 DIAGNOSIS — M150 Primary generalized (osteo)arthritis: Secondary | ICD-10-CM | POA: Diagnosis not present

## 2019-01-14 DIAGNOSIS — M50323 Other cervical disc degeneration at C6-C7 level: Secondary | ICD-10-CM | POA: Diagnosis not present

## 2019-01-14 DIAGNOSIS — M4322 Fusion of spine, cervical region: Secondary | ICD-10-CM | POA: Diagnosis not present

## 2019-01-14 DIAGNOSIS — G959 Disease of spinal cord, unspecified: Secondary | ICD-10-CM | POA: Diagnosis not present

## 2019-01-14 DIAGNOSIS — M8588 Other specified disorders of bone density and structure, other site: Secondary | ICD-10-CM | POA: Diagnosis not present

## 2019-01-14 DIAGNOSIS — Z981 Arthrodesis status: Secondary | ICD-10-CM | POA: Diagnosis not present

## 2019-01-19 DIAGNOSIS — L570 Actinic keratosis: Secondary | ICD-10-CM | POA: Diagnosis not present

## 2019-01-19 DIAGNOSIS — X32XXXA Exposure to sunlight, initial encounter: Secondary | ICD-10-CM | POA: Diagnosis not present

## 2019-01-19 DIAGNOSIS — Z08 Encounter for follow-up examination after completed treatment for malignant neoplasm: Secondary | ICD-10-CM | POA: Diagnosis not present

## 2019-01-19 DIAGNOSIS — Z85828 Personal history of other malignant neoplasm of skin: Secondary | ICD-10-CM | POA: Diagnosis not present

## 2019-01-19 DIAGNOSIS — L821 Other seborrheic keratosis: Secondary | ICD-10-CM | POA: Diagnosis not present

## 2019-01-20 ENCOUNTER — Ambulatory Visit: Payer: Medicare HMO

## 2019-01-20 DIAGNOSIS — M25511 Pain in right shoulder: Secondary | ICD-10-CM | POA: Diagnosis not present

## 2019-01-20 DIAGNOSIS — M19011 Primary osteoarthritis, right shoulder: Secondary | ICD-10-CM | POA: Diagnosis not present

## 2019-01-25 ENCOUNTER — Ambulatory Visit: Payer: Medicare HMO | Attending: Physician Assistant | Admitting: Occupational Therapy

## 2019-01-25 ENCOUNTER — Other Ambulatory Visit: Payer: Self-pay

## 2019-01-25 ENCOUNTER — Encounter: Payer: Self-pay | Admitting: Occupational Therapy

## 2019-01-25 DIAGNOSIS — M6281 Muscle weakness (generalized): Secondary | ICD-10-CM | POA: Insufficient documentation

## 2019-01-25 DIAGNOSIS — R208 Other disturbances of skin sensation: Secondary | ICD-10-CM

## 2019-01-25 NOTE — Patient Instructions (Signed)
Pt 16 oz hammer for sup /pro - 2 x 12 reps  Teal putty for gripping , 2 sets of 12  Lat grip 12 reps  X 2 and 3 point grip - snake 12 reps  X 2 alternated digits   Sensation - different textures  And rice bowl -and 5-8 objects - open eyes and close eyes- identify object- with L hand  Several times during day  And different digits try - if some better than others

## 2019-01-25 NOTE — Therapy (Signed)
[facility] [address] [place], [address] Phone: [phone]   Fax:  [phone]  Occupational Therapy Treatment  Patient Details  Name: Teresa [name] MRN: [id number] Date of Birth: [birth year] Referring Provider (OT): harris   Encounter Date: 01/25/2019  OT End of Session - 01/25/19 1559    Visit Number  1    Number of Visits  6    Date for OT Re-Evaluation  04/25/19    OT Start Time  1245    OT Stop Time  1343    OT Time Calculation (min)  58 min    Activity Tolerance  Patient tolerated treatment well    Behavior During Therapy  [facility] for tasks assessed/performed       Past Medical History:  Diagnosis Date  . Arthritis   . Asthma   . Back pain   . Breast cancer ([place]) 2009   left breast   . CAD (coronary artery disease)    a. 1994 s/p CABG x 1 (LIMA->LAD); b. 03/2015 MV: No ischemia; c. MV 11/18: small fixed apical defect likely secondary to breast attenuation, EF of 42%, frequent PVCs; d. 12/2017 Cath: LM nl, LAD 20p, D1/2/3 nl, LCX min irregs, OM1/2/3 min irregs, RCA nl, RPDA nl, RPL1/2 nl, LIMA->LAD atretic.  Marland Kitchen Chronic combined systolic (congestive) and diastolic (congestive) heart failure ([place])    a. 2013 EF 40%;  b. 03/2015 Echo: EF 55-60%; c. 12/18 Echo: EF of 35-40%; d. 12/2017 TEE: EF 35-40%; e. 03/2018 Echo: EF 40-45%; f. 09/2018 Echo: EF 35%.  . Dental crowns present    caps- left back top, right back bottom  . Gastroesophageal reflux disease   . Hypertension   . MI (myocardial infarction) ([place]) 1994  . Mixed Ischemic & Nonischemic cardiomyopathy    a. 2013 EF 40%;  b. 03/2015 Echo: EF 55-60%; c. 12/18 Echo: EF of 35-40%, hypokinesis of the anteroseptal, and apical myocardium, mild to mod MR, mod dil LA, nl RV fxn, PASP 53 mmHg; d. 12/2017 TEE: EF 35-40%, diff HK, mild to mod MR. small PFO. No LAA/RAA thrombus; e. 03/2018 Echo: EF 40-45%, antsept/inf HK, Gr2 DD, mild MR, mod idl LA, mild to mod TR,  PASP 35-10mmHg; f. 09/2018 Echo: EF 35%.  . Persistent atrial fibrillation    a. diagnosed 01/13/2018; b. CHADS2VASc = 6 --> Eliquis; c. 12/2017 s/p TEE/DCCV. Amio started but d/c'd 01/2018 2/2 n/anorexia; d. 10/2018 DCCV-->recurrent Afib w/in days; e. 10/2018 DCCV x 2 in ED->persistent Afib.  . Personal history of radiation therapy   . Psoriasis   . PSVT (paroxysmal supraventricular tachycardia) ([place])    a. 02/2015 Holter: short runs of SVT and NSVT.  Marland Kitchen Pulmonary hypertension ([place])   . PVC's (premature ventricular contractions)    a. 03/2018 24h Holter: Freq PVC's with a total of 421 beats in 24 hrs. 7 short runs of SVT likely representing Afib.  . Rheumatoid arthritis ([facility])    feet, hands  . Vertigo    approx 2x/yr    Past Surgical History:  Procedure Laterality Date  . ABDOMINAL HYSTERECTOMY    . back fusion    . BREAST BIOPSY Right    1991 negative  . BREAST EXCISIONAL BIOPSY Left    2009 positive  . BREAST LUMPECTOMY    . [place] N/A 01/15/2018   Procedure: CARDIOVERSION;  Surgeon:  Wellington Hampshire, MD;  Location: [facility];  Service: Cardiovascular;  Laterality: N/A;  . CARDIOVERSION N/A 10/29/2018   Procedure: CARDIOVERSION;  Surgeon: Minna Merritts, MD;  Location: [facility];  Service: Cardiovascular;  Laterality: N/A;  . CHOLECYSTECTOMY    . CORONARY ARTERY BYPASS GRAFT  1994   1 vessel - [facility]  . FOOT ARTHRODESIS Right 07/24/2016   Procedure: FUSION FIRST METATARSAL CUNEIFORM JOINT RIGHT FOOT, FUSION SECOND METATARSAL CUNEFORM JOINT BUNION REPAIR RIGHT FOOT;  Surgeon: Albertine Patricia, DPM;  Location: [place];  Service: Podiatry;  Laterality: Right;  . FOOT FRACTURE SURGERY    . HARDWARE REMOVAL Right 07/24/2016   Procedure: REMOVAL HARDWARE LATERAL MALLEOUS RIGHT ANKLE;  Surgeon: Albertine Patricia, DPM;  Location: [place];  Service: Podiatry;  Laterality: Right;  REMOVAL OF PIN WHICH  WAS INTACT  . HERNIA REPAIR    . NECK SURGERY    . RIGHT/LEFT HEART CATH AND CORONARY/GRAFT ANGIOGRAPHY N/A 01/14/2018   Procedure: LEFT HEART CATH AND CORONARY ANGIOGRAPHY;  Surgeon: Wellington Hampshire, MD;  Location: [place] [facility];  Service: Cardiovascular;  Laterality: N/A;  . TEE WITHOUT CARDIOVERSION N/A 01/15/2018   Procedure: TRANSESOPHAGEAL ECHOCARDIOGRAM (TEE);  Surgeon: Wellington Hampshire, MD;  Location: [facility];  Service: Cardiovascular;  Laterality: N/A;  . TOTAL HIP ARTHROPLASTY Left   . TOTAL KNEE ARTHROPLASTY Left     There were no vitals filed for this visit.  Subjective Assessment - 01/25/19 1550    Subjective   I had my neck surgery in Oct last year - but I have constant burning pain in my L midforearm to hand , weakness , numbness- fine motor , gripping and strength not good - hard time using my hand - L worse than the R     Patient Stated Goals  I want my L hand sensation, strength better so I can use it better - to hold , pick up object, do buttons , cut food,     Currently in Pain?  Yes    Pain Score  10-Worst pain ever    Pain Location  Hand    Pain Orientation  Left    Pain Descriptors / Indicators  Burning    Pain Type  Surgical pain    Pain Onset  More than a month ago    Pain Frequency  Constant         [facility] OT Assessment - 01/25/19 0001      Assessment   Medical Diagnosis  hand weakness s/p cervical fusion    Referring Provider (OT)  harris    Onset Date/Surgical Date  09/23/18    Hand Dominance  Right    Next MD Visit  --   3 months    Prior Therapy  --   PT for cervical fusion     Precautions   Precaution Comments  --   cervical fusion      Home  Environment   Lives With  Spouse      Prior Function   Vocation  Retired    Leisure  2 little dogs, like to work in yard, do some housework       AROM   Overall AROM Comments  --   ABD of 5th - L worse than R , unable to do opposition of 5th     Strength   Right Hand Grip (lbs)  40     Right Hand Lateral Pinch  5 lbs  Right Hand 3 Point Pinch  6 lbs    Left Hand Grip (lbs)  20    Left Hand Lateral Pinch  3 lbs    Left Hand 3 Point Pinch  2 lbs        Findings of eval discuss -and homeprogram  Review and hand out provided   M  Pt 16 oz hammer for sup /pro - 2 x 12 reps  Teal putty for gripping , 2 sets of 12  Lat grip 12 reps  X 2 and 3 point grip - snake 12 reps  X 2 alternated digits   Sensation - different textures  And rice bowl -and 5-8 objects - open eyes and close eyes- identify object- with L hand  Several times during day  And different digits try - if some better than others             OT Education - 01/25/19 1558    Education Details  findings of eval ,HEP , healing and rehab nerve and after cervical fusion    Person(s) Educated  Patient    Methods  Explanation;Demonstration;Handout    Comprehension  Verbalized understanding;Returned demonstration          OT Long Term Goals - 01/25/19 1606      OT LONG TERM GOAL #1   Title  Pt to show increase grip and prehension strength 2-5 lbs to easier to cut food, squeeze washcloth     Baseline  Grip R 40 L 20 ; Lat R 4, L 3lbs ; 3 point grip R 6, L 2 lbs     Time  6    Period  Weeks    Status  New    Target Date  03/08/19      OT LONG TERM GOAL #2   Title  Pt report decrease burning sensation - not constant and more distal than midforearm     Baseline  buring pain - constant - 10/10 , mid forearm to fingers tips     Time  6    Period  Weeks    Status  New    Target Date  03/09/19      OT LONG TERM GOAL #3   Title  Pt to be independent in HEP to add 5th place and hold ,show increase grip and prehension strength , increase sensation     Baseline  no knowledge of HEP     Time  8    Period  Weeks    Status  New    Target Date  03/26/19      OT LONG TERM GOAL #4   Title  Pt to improve on function score on PRWHE with more than 14 point     Baseline  at eval PRWHE function  score 24/50    Time  12    Period  Weeks    Status  New    Target Date  04/25/19            Plan - 01/25/19 1600    Clinical Impression Statement  Pt refer to OT for hand weakness - pt had in Oct 2019 Fusion of C3 to C6 - pt cont to have constant burning feeling in midforearm to fingers in L arm - 10/10 reported - and decrease sensatiion in 4thand 5th digits worse than thumb thru 3rd - pt show decrease Add of 5th and opposition - L worse than R - significant weakness in prehension grip - R  grip 40 , L 20 lbs - pt ed on nerve healing after decompression , findings of eval - strenghening and sensory reeducation - pt to be seen biweekly to every 3 wks for 3 months     Occupational performance deficits (Please refer to evaluation for details):  ADL's;IADL's;Leisure;Rest and Sleep    Rehab Potential  Fair    Current Impairments/barriers affecting progress:  Had cervical compression for long time -and Fusion was in Oct - pt only 31months s/p     OT Frequency  --   biweekly to every 3rd week    OT Duration  12 weeks    OT Treatment/Interventions  Patient/family education;Self-care/ADL training;DME and/or AE instruction;Therapeutic exercise;Coping strategies training;Other (comment)   sensation reed    Plan  assess progress with HEP     Clinical Decision Making  Several treatment options, min-mod task modification necessary    OT Home Exercise Plan  see pt instruction     Consulted and Agree with Plan of Care  Patient       Patient will benefit from skilled therapeutic intervention in order to improve the following deficits and impairments:  Impaired sensation, Pain, Impaired UE functional use, Decreased strength, Decreased knowledge of use of DME  Visit Diagnosis: Other disturbances of skin sensation  Muscle weakness (generalized)    Problem List Patient Active Problem List   Diagnosis Date Noted  . Atrial fibrillation ([place]) 11/11/2018  . A-fib ([place]) 10/12/2018  . Rheumatoid  arthritis ([place]) 10/10/2018  . Atrial fibrillation with RVR ([place]) 10/10/2018  . Psoriatic arthritis ([place]) 03/04/2018  . Unstable angina ([place])   . Atrial fibrillation with rapid ventricular response ([place]) 01/13/2018  . 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 Asthma 05/09/2017  . 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 Arthralgia of both hands 01/31/2016  . Ventricular premature depolarization 09/11/2015  . Psoriasis 07/16/2015  . 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 Hyperlipidemia 03/29/2015  . Pre-syncope 03/04/2015  . CAD (coronary artery disease)   . PVC's (premature ventricular contractions)   . Ischemic cardiomyopathy   . DDD (degenerative disc disease), lumbar 01/18/2015  . Intervertebral disc disorder with radiculopathy of lumbar region 12/26/2014  . L-S radiculopathy 07/03/2014  . Breast CA ([place]) 06/28/2014  . Arthritis, degenerative 06/28/2014  . Acid reflux 03/08/2014  . 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    Rosalyn Gess OTR/l,CLT 01/25/2019, 4:16 PM  [facility] [address] [place], [address] Phone: [phone]   Fax:  [phone]  Name: KEMARI MARES MRN: [id number] Date of Birth: Jul 29, 1941

## 2019-01-28 ENCOUNTER — Other Ambulatory Visit: Payer: Self-pay | Admitting: Cardiovascular Disease

## 2019-01-28 NOTE — Telephone Encounter (Signed)
Please review for refill.  The patient was last seen by [facility] EP. Dr. Glennon Mac decreased the dose of Diltiazem  from 240 mg to 120 mg.  Please review for refill.

## 2019-01-28 NOTE — Telephone Encounter (Signed)
Do not refill. The patient's dilitazem was recently decreased by Dr. Glennon Mac at [facility].  "Telephone Encounter - Carolanne Grumbling, RN - 01/12/2019 9:38 AM EST Called Ms. Slomski to instruct the following recommended by Dr Glennon Mac:  Toprol XL 150 mg qam and 100mg  qpm  Reduce the Diltiazem from 240 to 120 CD .   Reinforced: medication changes are for heart rate controlling strategy, it does not treat her underlying rhythm disorder.  New scripts sent to her pharmacy.  Patient verbalized understanding. No questions verbalized at this time."    It appears they have already updated this at her pharmacy.

## 2019-02-08 ENCOUNTER — Ambulatory Visit: Payer: Medicare HMO | Admitting: Occupational Therapy

## 2019-02-10 ENCOUNTER — Telehealth: Payer: Self-pay | Admitting: Cardiovascular Disease

## 2019-02-10 NOTE — Telephone Encounter (Signed)
New message   Patient states that she is missing physician portion of the Eliquis paperwork so that the patient can receive this medication for free. Please call to discuss.

## 2019-02-11 NOTE — Telephone Encounter (Signed)
Call placed to [facility] to found out what portion was missing. According to previous notes, the assistance forms were faxed back in 10/2018.  [facility] stated that they only need to know if the patient was being seen inpatient or outpatient. The forms have been re-faxed and the patient has been made aware.

## 2019-02-11 NOTE — Telephone Encounter (Signed)
Lattie Haw,   Do you know anything about an application for Eliquis for this patient?  I can't see anything in her chart where we were working on this.   She is also seeing Dr. Glennon Mac at [facility]

## 2019-02-16 ENCOUNTER — Ambulatory Visit (INDEPENDENT_AMBULATORY_CARE_PROVIDER_SITE_OTHER): Payer: Medicare HMO | Admitting: Family Medicine

## 2019-02-16 ENCOUNTER — Encounter: Payer: Self-pay | Admitting: Family Medicine

## 2019-02-16 VITALS — BP 126/78 | HR 102 | Temp 97.5°F | Resp 16 | Wt 160.0 lb

## 2019-02-16 DIAGNOSIS — Z0000 Encounter for general adult medical examination without abnormal findings: Secondary | ICD-10-CM | POA: Diagnosis not present

## 2019-02-16 DIAGNOSIS — M5412 Radiculopathy, cervical region: Secondary | ICD-10-CM

## 2019-02-16 DIAGNOSIS — I25728 Atherosclerosis of autologous artery coronary artery bypass graft(s) with other forms of angina pectoris: Secondary | ICD-10-CM

## 2019-02-16 DIAGNOSIS — C50919 Malignant neoplasm of unspecified site of unspecified female breast: Secondary | ICD-10-CM | POA: Diagnosis not present

## 2019-02-16 DIAGNOSIS — I255 Ischemic cardiomyopathy: Secondary | ICD-10-CM

## 2019-02-16 DIAGNOSIS — I4891 Unspecified atrial fibrillation: Secondary | ICD-10-CM | POA: Diagnosis not present

## 2019-02-16 DIAGNOSIS — R5383 Other fatigue: Secondary | ICD-10-CM | POA: Diagnosis not present

## 2019-02-16 NOTE — Progress Notes (Signed)
Patient: Teresa [name], Female    DOB: 1941-08-28, 78 y.o.   MRN: [id number] Visit Date: 02/16/2019  Today's Provider: [name], MD   Chief Complaint  Patient presents with  . Annual Exam   Subjective:     Annual physical exam MARIELOUISE [name] is a 78 y.o. female who presents today for health maintenance and complete physical. She feels well. She reports exercising not regularly, but she does stay active. She reports she is sleeping well.  Colonoscopy- 04/10/2008. Diverticulosis. Mammogram- 06/11/2017. Normal. Has history of breast cancer.  BMD- 09/30/2017. Normal.    Review of Systems  Constitutional: Negative.   HENT: Negative.   Eyes: Negative.   Respiratory: Negative.   Cardiovascular: Negative.   Gastrointestinal: Negative.   Endocrine: Negative.   Genitourinary: Negative.   Musculoskeletal: Negative.   Skin: Negative.   Allergic/Immunologic: Negative.   Neurological: Negative.   Hematological: Negative.   Psychiatric/Behavioral: Negative.     Social History      She  reports that she has quit smoking. Her smoking use included cigarettes. She has a 17.50 pack-year smoking history. She has never used smokeless tobacco. She reports that she does not drink alcohol or use drugs.       Social History   Socioeconomic History  . Marital status: Divorced    Spouse name: Not on file  . Number of children: 2  . Years of education: Not on file  . Highest education level: 11th grade  Occupational History  . Occupation: retired  Scientific laboratory technician  . Financial resource strain: Not hard at all  . Food insecurity:    Worry: Never true    Inability: Never true  . Transportation needs:    Medical: No    Non-medical: No  Tobacco Use  . Smoking status: Former Smoker    Packs/day: 0.50    Years: 35.00    Pack years: 17.50    Types: Cigarettes  . Smokeless tobacco: Never Used  . Tobacco comment: Quit approx 1990  Substance and Sexual Activity  . Alcohol  use: No  . Drug use: No  . Sexual activity: Not on file  Lifestyle  . Physical activity:    Days per week: 0 days    Minutes per session: 0 min  . Stress: Only a little  Relationships  . Social connections:    Talks on phone: Patient refused    Gets together: Patient refused    Attends religious service: Patient refused    Active member of club or organization: Patient refused    Attends meetings of clubs or organizations: Patient refused    Relationship status: Patient refused  Other Topics Concern  . Not on file  Social History Narrative  . Not on file    Past Medical History:  Diagnosis Date  . Arthritis   . Asthma   . Back pain   . Breast cancer (Teresa [name]) 2009   left breast   . CAD (coronary artery disease)    a. 1994 s/p CABG x 1 (LIMA->LAD); b. 03/2015 MV: No ischemia; c. MV 11/18: small fixed apical defect likely secondary to breast attenuation, EF of 42%, frequent PVCs; d. 12/2017 Cath: LM nl, LAD 20p, D1/2/3 nl, LCX min irregs, OM1/2/3 min irregs, RCA nl, RPDA nl, RPL1/2 nl, LIMA->LAD atretic.  Marland Kitchen Chronic combined systolic (congestive) and diastolic (congestive) heart failure ([place])    a. 2013 EF 40%;  b. 03/2015 Echo: EF 55-60%; c. 12/18  Echo: EF of 35-40%; d. 12/2017 TEE: EF 35-40%; e. 03/2018 Echo: EF 40-45%; f. 09/2018 Echo: EF 35%.  . Dental crowns present    caps- left back top, right back bottom  . Gastroesophageal reflux disease   . Hypertension   . MI (myocardial infarction) ([place]) 1994  . Mixed Ischemic & Nonischemic cardiomyopathy    a. 2013 EF 40%;  b. 03/2015 Echo: EF 55-60%; c. 12/18 Echo: EF of 35-40%, hypokinesis of the anteroseptal, and apical myocardium, mild to mod MR, mod dil LA, nl RV fxn, PASP 53 mmHg; d. 12/2017 TEE: EF 35-40%, diff HK, mild to mod MR. small PFO. No LAA/RAA thrombus; e. 03/2018 Echo: EF 40-45%, antsept/inf HK, Gr2 DD, mild MR, mod idl LA, mild to mod TR, PASP 35-63mmHg; f. 09/2018 Echo: EF 35%.  . Persistent atrial fibrillation    a.  diagnosed 01/13/2018; b. CHADS2VASc = 6 --> Eliquis; c. 12/2017 s/p TEE/DCCV. Amio started but d/c'd 01/2018 2/2 n/anorexia; d. 10/2018 DCCV-->recurrent Afib w/in days; e. 10/2018 DCCV x 2 in ED->persistent Afib.  . Personal history of radiation therapy   . Psoriasis   . PSVT (paroxysmal supraventricular tachycardia) ([place])    a. 02/2015 Holter: short runs of SVT and NSVT.  Marland Kitchen Pulmonary hypertension ([place])   . PVC's (premature ventricular contractions)    a. 03/2018 24h Holter: Freq PVC's with a total of 421 beats in 24 hrs. 7 short runs of SVT likely representing Afib.  . Rheumatoid arthritis (HCC)    feet, hands  . Vertigo    approx 2x/yr     Patient Active Problem List   Diagnosis Date Noted  . Atrial fibrillation (Teresa [name]) 11/11/2018  . A-fib (Teresa [name]) 10/12/2018  . Rheumatoid arthritis ([place]) 10/10/2018  . Atrial fibrillation with RVR (Teresa [name]) 10/10/2018  . Psoriatic arthritis (Teresa [name]) 03/04/2018  . Unstable angina (Teresa [name])   . Atrial fibrillation with rapid ventricular response (Teresa [name]) 01/13/2018  . 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 Asthma 05/09/2017  . 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 Arthralgia of both hands 01/31/2016  . Ventricular premature depolarization 09/11/2015  . Psoriasis 07/16/2015  . 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 Hyperlipidemia 03/29/2015  . Pre-syncope 03/04/2015  . CAD (coronary artery disease)   . PVC's (premature ventricular contractions)   . Ischemic cardiomyopathy   . DDD (degenerative disc disease), lumbar 01/18/2015  . Intervertebral disc disorder with radiculopathy of lumbar region 12/26/2014  . L-S radiculopathy 07/03/2014  . Breast CA (Teresa [name]) 06/28/2014  . Arthritis, degenerative 06/28/2014  . Acid reflux 03/08/2014  . 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    Past Surgical History:  Procedure Laterality Date  . ABDOMINAL HYSTERECTOMY    . back fusion    . BREAST BIOPSY Right    1991  negative  . BREAST EXCISIONAL BIOPSY Left    2009 positive  . BREAST LUMPECTOMY    . [place] N/A 01/15/2018   Procedure: CARDIOVERSION;  Surgeon: Wellington Hampshire, MD;  Location: [facility];  Service: Cardiovascular;  Laterality: N/A;  . CARDIOVERSION N/A 10/29/2018   Procedure: CARDIOVERSION;  Surgeon: Minna Merritts, MD;  Location: [facility];  Service: Cardiovascular;  Laterality: N/A;  . CHOLECYSTECTOMY    . CORONARY ARTERY BYPASS GRAFT  1994   1 vessel - [facility]  . FOOT ARTHRODESIS Right 07/24/2016   Procedure: FUSION FIRST METATARSAL CUNEIFORM JOINT  RIGHT FOOT, FUSION SECOND METATARSAL CUNEFORM JOINT BUNION REPAIR RIGHT FOOT;  Surgeon: Albertine Patricia, DPM;  Location: [facility];  Service: Podiatry;  Laterality: Right;  . FOOT FRACTURE SURGERY    . HARDWARE REMOVAL Right 07/24/2016   Procedure: REMOVAL HARDWARE LATERAL MALLEOUS RIGHT ANKLE;  Surgeon: Albertine Patricia, DPM;  Location: [place];  Service: Podiatry;  Laterality: Right;  REMOVAL OF PIN WHICH WAS INTACT  . HERNIA REPAIR    . NECK SURGERY    . RIGHT/LEFT HEART CATH AND CORONARY/GRAFT ANGIOGRAPHY N/A 01/14/2018   Procedure: LEFT HEART CATH AND CORONARY ANGIOGRAPHY;  Surgeon: Wellington Hampshire, MD;  Location: [facility];  Service: Cardiovascular;  Laterality: N/A;  . TEE WITHOUT CARDIOVERSION N/A 01/15/2018   Procedure: TRANSESOPHAGEAL ECHOCARDIOGRAM (TEE);  Surgeon: Wellington Hampshire, MD;  Location: [facility];  Service: Cardiovascular;  Laterality: N/A;  . TOTAL HIP ARTHROPLASTY Left   . TOTAL KNEE ARTHROPLASTY Left     Family History        Family Status  Relation Name Status  . Father  Deceased at age 32  . Mother  Deceased  . Sister  Deceased  . Brother  Alive  . Daughter  Alive  . Son  Alive  . Sister  Alive  . Sister  Alive  . Sister  Deceased  . Brother  Deceased  . Brother  Alive        Her family history  includes Arthritis in her brother, sister, and sister; Bladder Cancer in her brother; Breast cancer in her sister and sister; Breast cancer (age of onset: 35) in her mother; Congestive Heart Failure in her mother; Heart attack in her father; Kidney failure in her sister; Lung cancer in her brother, sister, and sister; Prostate cancer in her brother.      Allergies  Allergen Reactions  . Sulfa Antibiotics Hives  . Ivp Dye [Iodinated Diagnostic Agents] Swelling  . Amiodarone Nausea And Vomiting and Cough  . Codeine Other (See Comments)    ALTERED MENTAL STATUS     Current Outpatient Medications:  .  apixaban (ELIQUIS) 5 MG TABS tablet, Take 1 tablet (5 mg total) by mouth 2 (two) times daily., Disp: 180 tablet, Rfl: 3 .  Apremilast (OTEZLA) 30 MG TABS, Take 30 mg by mouth 2 (two) times daily., Disp: , Rfl:  .  budesonide-formoterol (SYMBICORT) 160-4.5 MCG/ACT inhaler, Inhale 2 puffs into the lungs 2 (two) times daily. (Patient taking differently: Inhale 2 puffs into the lungs 2 (two) times daily. As needed), Disp: 1 Inhaler, Rfl: 3 .  diltiazem (CARDIZEM CD) 240 MG 24 hr capsule, Take 1 capsule (240 mg total) by mouth daily., Disp: 30 capsule, Rfl: 1 .  diltiazem (CARDIZEM) 30 MG tablet, Take 30 mg by mouth 3 (three) times daily as needed (For a heart rate greater than 100)., Disp: , Rfl:  .  fluticasone (FLONASE) 50 MCG/ACT nasal spray, Place 2 sprays into both nostrils daily as needed for allergies. , Disp: , Rfl:  .  furosemide (LASIX) 20 MG tablet, TAKE 1 TABLET (20 MG TOTAL) BY MOUTH DAILY AS NEEDED (SHORTNESS OF BREATH)., Disp: 30 tablet, Rfl: 1 .  gabapentin (NEURONTIN) 300 MG capsule, Take 600 mg by mouth 3 (three) times daily. , Disp: , Rfl:  .  methocarbamol (ROBAXIN) 750 MG tablet, Take 750 mg by mouth 2 (two) times daily. , Disp: , Rfl:  .  metoprolol succinate (TOPROL-XL) 100 MG 24 hr tablet, Take 100 mg by  mouth 2 (two) times daily. Take with or immediately following a meal., Disp:  , Rfl:  .  mometasone-formoterol (DULERA) 200-5 MCG/ACT AERO, Inhale 2 puffs into the lungs 2 (two) times daily as needed for wheezing or shortness of breath., Disp: , Rfl:  .  montelukast (SINGULAIR) 10 MG tablet, Take 10 mg by mouth daily as needed (for allergies). , Disp: , Rfl: 12 .  Multiple Vitamins-Minerals (MULTIVITAMIN PO), Take 1 tablet by mouth daily., Disp: , Rfl:  .  Polyethyl Glycol-Propyl Glycol (SYSTANE OP), Place 1 drop into both eyes daily as needed (dry eyes)., Disp: , Rfl:  .  rosuvastatin (CRESTOR) 5 MG tablet, Take 1 tablet (5 mg total) by mouth daily., Disp: 90 tablet, Rfl: 2 .  sennosides-docusate sodium (SENOKOT-S) 8.6-50 MG tablet, Take 1 tablet by mouth 2 (two) times daily as needed for constipation. , Disp: , Rfl:  .  venlafaxine (EFFEXOR) 75 MG tablet, Take 75 mg by mouth daily., Disp: , Rfl:    Patient Care Team: Jerrol Banana., MD as PCP - General (Family Medicine) Wellington Hampshire, MD as PCP - Cardiology (Cardiology) Dasher, Rayvon Char, MD as Consulting Physician (Dermatology) Blanche East, MD as Consulting Physician (Neurosurgery) Dingeldein, Remo Lipps, MD (Ophthalmology)    Objective:    Vitals: BP 126/78 (BP Location: Left Arm, Patient Position: Sitting, Cuff Size: Normal)   Pulse (!) 102 Comment: irregular  Temp (!) 97.5 F (36.4 C)   Resp 16   Wt 160 lb (72.6 kg)   SpO2 94%   BMI 27.46 kg/m    Vitals:   02/16/19 1419  BP: 126/78  Pulse: (!) 102  Resp: 16  Temp: (!) 97.5 F (36.4 C)  SpO2: 94%  Weight: 160 lb (72.6 kg)     Physical Exam Constitutional:      Appearance: Normal appearance. She is well-developed.  HENT:     Head: Normocephalic and atraumatic.     Right Ear: External ear normal.     Left Ear: External ear normal.     Nose: Nose normal.     Mouth/Throat:     Pharynx: Oropharynx is clear.  Eyes:     General: No scleral icterus.    Conjunctiva/sclera: Conjunctivae normal.  Neck:     Thyroid: No  thyromegaly.  Cardiovascular:     Rate and Rhythm: Normal rate and regular rhythm.     Heart sounds: Normal heart sounds.  Pulmonary:     Effort: Pulmonary effort is normal.     Breath sounds: Normal breath sounds.  Chest:     Breasts:        Right: Normal.        Left: Normal.  Abdominal:     Palpations: Abdomen is soft.  Musculoskeletal:        General: Deformity present.     Comments: Cervical and thoracic scoliosis which patient says has occurred since her cervical fusion.  No pain.  Skin:    General: Skin is warm and dry.  Neurological:     General: No focal deficit present.     Mental Status: She is alert and oriented to person, place, and time. Mental status is at baseline.     Comments: Mild decrease use of left hand associated with cervical radiculopathy  Psychiatric:        Mood and Affect: Mood normal.        Behavior: Behavior normal.        Thought Content: Thought content normal.  Judgment: Judgment normal.      Depression Screen PHQ 2/9 Scores 01/05/2019 01/05/2019 10/22/2017 09/16/2017  PHQ - 2 Score 0 0 1 0  PHQ- 9 Score 4 - 7 -       Assessment & Plan:     Routine Health Maintenance and Physical Exam  Exercise Activities and Dietary recommendations Goals    . Increase water intake     Recommend to increase fluid intake from 2 - 16 oz water bottles every day to 4 - 16 oz water bottles every day  01/05/19- Recommend to drink at least 6-8 8oz glasses of water per day.       Immunization History  Administered Date(s) Administered  . Influenza Whole 08/22/2018  . Influenza, High Dose Seasonal PF 10/04/2015, 10/06/2016, 09/16/2017, 08/31/2018  . Influenza-Unspecified 07/22/2014  . Pneumococcal Conjugate-13 10/06/2016    Health Maintenance  Topic Date Due  . TETANUS/TDAP  08/09/2018  . DEXA SCAN  09/30/2022  . INFLUENZA VACCINE  Completed  . PNA vac Low Risk Adult  Completed     Discussed health benefits of physical activity, and  encouraged her to engage in regular exercise appropriate for her age and condition.  1. Encounter for annual physical exam   2. Atrial fibrillation with rapid ventricular response (HCC)  - Comprehensive metabolic panel  3. Coronary artery disease involving autologous artery coronary bypass graft with other forms of angina pectoris ([place])  - Lipid panel  4. Other fatigue  - CBC with Differential/Platelet - TSH  5. Ischemic cardiomyopathy   6. Cervical radiculopathy, chronic   7. Malignant neoplasm of female breast, unspecified estrogen receptor status, unspecified laterality, unspecified site of breast ([place])  I have done the exam and reviewed the chart and it is accurate to the best of my knowledge. Development worker, community has been used and  any errors in dictation or transcription are unintentional. Miguel Aschoff M.D. [place], MD  [facility]

## 2019-02-17 LAB — CBC WITH DIFFERENTIAL/PLATELET
Basophils Absolute: 0 10*3/uL (ref 0.0–0.2)
Basos: 1 %
EOS (ABSOLUTE): 0.2 10*3/uL (ref 0.0–0.4)
Eos: 4 %
Hematocrit: 46 % (ref 34.0–46.6)
Hemoglobin: 14.3 g/dL (ref 11.1–15.9)
Immature Grans (Abs): 0 10*3/uL (ref 0.0–0.1)
Immature Granulocytes: 0 %
Lymphocytes Absolute: 2.5 10*3/uL (ref 0.7–3.1)
Lymphs: 41 %
MCH: 27.7 pg (ref 26.6–33.0)
MCHC: 31.1 g/dL — ABNORMAL LOW (ref 31.5–35.7)
MCV: 89 fL (ref 79–97)
Monocytes Absolute: 0.7 10*3/uL (ref 0.1–0.9)
Monocytes: 12 %
Neutrophils Absolute: 2.5 10*3/uL (ref 1.4–7.0)
Neutrophils: 42 %
Platelets: 334 10*3/uL (ref 150–450)
RBC: 5.17 x10E6/uL (ref 3.77–5.28)
RDW: 15.8 % — ABNORMAL HIGH (ref 11.7–15.4)
WBC: 6 10*3/uL (ref 3.4–10.8)

## 2019-02-17 LAB — LIPID PANEL
Chol/HDL Ratio: 2.3 ratio (ref 0.0–4.4)
Cholesterol, Total: 132 mg/dL (ref 100–199)
HDL: 57 mg/dL (ref >39)
LDL Calculated: 62 mg/dL (ref 0–99)
Triglycerides: 65 mg/dL (ref 0–149)
VLDL Cholesterol Cal: 13 mg/dL (ref 5–40)

## 2019-02-17 LAB — COMPREHENSIVE METABOLIC PANEL
ALT: 17 IU/L (ref 0–32)
AST: 19 IU/L (ref 0–40)
Albumin/Globulin Ratio: 1.8 (ref 1.2–2.2)
Albumin: 4.3 g/dL (ref 3.7–4.7)
Alkaline Phosphatase: 103 IU/L (ref 39–117)
BUN/Creatinine Ratio: 12 (ref 12–28)
BUN: 11 mg/dL (ref 8–27)
Bilirubin Total: 0.6 mg/dL (ref 0.0–1.2)
CO2: 23 mmol/L (ref 20–29)
Calcium: 9.6 mg/dL (ref 8.7–10.3)
Chloride: 104 mmol/L (ref 96–106)
Creatinine, Ser: 0.92 mg/dL (ref 0.57–1.00)
GFR calc Af Amer: 69 mL/min/{1.73_m2} (ref >59)
GFR calc non Af Amer: 60 mL/min/{1.73_m2} (ref >59)
Globulin, Total: 2.4 g/dL (ref 1.5–4.5)
Glucose: 100 mg/dL — ABNORMAL HIGH (ref 65–99)
Potassium: 4.4 mmol/L (ref 3.5–5.2)
Sodium: 144 mmol/L (ref 134–144)
Total Protein: 6.7 g/dL (ref 6.0–8.5)

## 2019-02-17 LAB — TSH: TSH: 1.34 u[IU]/mL (ref 0.450–4.500)

## 2019-02-21 ENCOUNTER — Other Ambulatory Visit: Payer: Self-pay | Admitting: Cardiovascular Disease

## 2019-02-21 NOTE — Telephone Encounter (Signed)
I spoke with pt and she mentioned that she is not taking Lisinopril 10 mg tablet.

## 2019-02-23 NOTE — Progress Notes (Signed)
Cardiology Office Note   Date:  02/25/2019   ID:  [name], DOB April 17, 1941, MRN [id number]  PCP:  Jerrol Banana., MD  Cardiologist:  Kathlyn Sacramento, MD   Chief Complaint  Patient presents with  . Other    2 month follow up. Patient c/o SOB and swelling in ankles " every once in a while" Meds reviewed verbally with patient.       History of Present Illness: Teresa [name] is a 78 y.o. female who presents for a follow-up visit regarding coronary artery disease chronic systolic heart failure and persistent atrial fibrillation.  She is status post CABG in 1994 . She is known to have mild ischemic cardiomyopathy . She was diagnosed with PVCs in 2015 and was started on small dose Toprol. She does have underlying left bundle branch block. She has no history of diabetes, hypertension or hyperlipidemia. She is not a smoker. There is family history of coronary artery disease. She had worsening shortness of breath and chest pain in the fall 2018.  Echocardiogram showed a drop in EF to 35-40%.  She was then hospitalized for A. fib with RVR.  She underwent cardiac catheterization to evaluate her cardiomyopathy.  Cardiac cath showed atretic LIMA to LAD with otherwise mild nonobstructive disease.  She underwent successful TEE guided cardioversion.  She was on amiodarone which had to be stopped due to GI symptoms and anorexia.  She also had significant dry cough.  All the symptoms resolved after stopping amiodarone.  She had recurrent atrial fibrillation with rapid ventricular response with significant symptoms and failed multiple cardioversion.  She was seen by the A. fib clinic with plans for treatment with dofetilide. She seeked another opinion from Dr. Glennon Mac at [facility].  Ablation was suggested but the patient wanted to try adjustment in her medications first.  The patient is not doing well today.  Prior to going to the ED she had a fall. A lot of the times she can feel palpitations.  Endorses shortness of breath. She does not feel that the medication changes done have helped her feel better.  She denies chest pain or any other related symptoms or complaints at this time.    Past Medical History:  Diagnosis Date  . Arthritis   . Asthma   . Back pain   . Breast cancer ([place]) 2009   left breast   . CAD (coronary artery disease)    a. 1994 s/p CABG x 1 (LIMA->LAD); b. 03/2015 MV: No ischemia; c. MV 11/18: small fixed apical defect likely secondary to breast attenuation, EF of 42%, frequent PVCs; d. 12/2017 Cath: LM nl, LAD 20p, D1/2/3 nl, LCX min irregs, OM1/2/3 min irregs, RCA nl, RPDA nl, RPL1/2 nl, LIMA->LAD atretic.  Marland Kitchen Chronic combined systolic (congestive) and diastolic (congestive) heart failure ([place])    a. 2013 EF 40%;  b. 03/2015 Echo: EF 55-60%; c. 12/18 Echo: EF of 35-40%; d. 12/2017 TEE: EF 35-40%; e. 03/2018 Echo: EF 40-45%; f. 09/2018 Echo: EF 35%.  . Dental crowns present    caps- left back top, right back bottom  . Gastroesophageal reflux disease   . Hypertension   . MI (myocardial infarction) ([place]) 1994  . Mixed Ischemic & Nonischemic cardiomyopathy    a. 2013 EF 40%;  b. 03/2015 Echo: EF 55-60%; c. 12/18 Echo: EF of 35-40%, hypokinesis of the anteroseptal, and apical myocardium, mild to mod MR, mod dil LA, nl RV fxn, PASP 53 mmHg; d. 12/2017 TEE:  EF 35-40%, diff HK, mild to mod MR. small PFO. No LAA/RAA thrombus; e. 03/2018 Echo: EF 40-45%, antsept/inf HK, Gr2 DD, mild MR, mod idl LA, mild to mod TR, PASP 35-5mmHg; f. 09/2018 Echo: EF 35%.  . Persistent atrial fibrillation    a. diagnosed 01/13/2018; b. CHADS2VASc = 6 --> Eliquis; c. 12/2017 s/p TEE/DCCV. Amio started but d/c'd 01/2018 2/2 n/anorexia; d. 10/2018 DCCV-->recurrent Afib w/in days; e. 10/2018 DCCV x 2 in ED->persistent Afib.  . Personal history of radiation therapy   . Psoriasis   . PSVT (paroxysmal supraventricular tachycardia) ([place])    a. 02/2015 Holter: short runs of SVT and NSVT.  Marland Kitchen Pulmonary  hypertension ([place])   . PVC's (premature ventricular contractions)    a. 03/2018 24h Holter: Freq PVC's with a total of 421 beats in 24 hrs. 7 short runs of SVT likely representing Afib.  . Rheumatoid arthritis (HCC)    feet, hands  . Vertigo    approx 2x/yr    Past Surgical History:  Procedure Laterality Date  . ABDOMINAL HYSTERECTOMY    . back fusion    . BREAST BIOPSY Right    1991 negative  . BREAST EXCISIONAL BIOPSY Left    2009 positive  . BREAST LUMPECTOMY    . [place] N/A 01/15/2018   Procedure: CARDIOVERSION;  Surgeon: Wellington Hampshire, MD;  Location: [facility];  Service: Cardiovascular;  Laterality: N/A;  . CARDIOVERSION N/A 10/29/2018   Procedure: CARDIOVERSION;  Surgeon: Minna Merritts, MD;  Location: [facility];  Service: Cardiovascular;  Laterality: N/A;  . CHOLECYSTECTOMY    . CORONARY ARTERY BYPASS GRAFT  1994   1 vessel - [facility]  . FOOT ARTHRODESIS Right 07/24/2016   Procedure: FUSION FIRST METATARSAL CUNEIFORM JOINT RIGHT FOOT, FUSION SECOND METATARSAL CUNEFORM JOINT BUNION REPAIR RIGHT FOOT;  Surgeon: Albertine Patricia, DPM;  Location: [place];  Service: Podiatry;  Laterality: Right;  . FOOT FRACTURE SURGERY    . HARDWARE REMOVAL Right 07/24/2016   Procedure: REMOVAL HARDWARE LATERAL MALLEOUS RIGHT ANKLE;  Surgeon: Albertine Patricia, DPM;  Location: [place];  Service: Podiatry;  Laterality: Right;  REMOVAL OF PIN WHICH WAS INTACT  . HERNIA REPAIR    . NECK SURGERY    . RIGHT/LEFT HEART CATH AND CORONARY/GRAFT ANGIOGRAPHY N/A 01/14/2018   Procedure: LEFT HEART CATH AND CORONARY ANGIOGRAPHY;  Surgeon: Wellington Hampshire, MD;  Location: [place] [facility];  Service: Cardiovascular;  Laterality: N/A;  . TEE WITHOUT CARDIOVERSION N/A 01/15/2018   Procedure: TRANSESOPHAGEAL ECHOCARDIOGRAM (TEE);  Surgeon: Wellington Hampshire, MD;  Location: [facility];  Service: Cardiovascular;   Laterality: N/A;  . TOTAL HIP ARTHROPLASTY Left   . TOTAL KNEE ARTHROPLASTY Left      Current Outpatient Medications  Medication Sig Dispense Refill  . apixaban (ELIQUIS) 5 MG TABS tablet Take 1 tablet (5 mg total) by mouth 2 (two) times daily. 180 tablet 3  . Apremilast (OTEZLA) 30 MG TABS Take 30 mg by mouth 2 (two) times daily.    . budesonide-formoterol (SYMBICORT) 160-4.5 MCG/ACT inhaler Inhale 2 puffs into the lungs 2 (two) times daily. (Patient taking differently: Inhale 2 puffs into the lungs 2 (two) times daily. As needed) 1 Inhaler 3  . diltiazem (TIAZAC) 120 MG 24 hr capsule Take 120 mg by mouth daily.    . fluticasone (FLONASE) 50 MCG/ACT nasal spray Place 2 sprays into  both nostrils daily as needed for allergies.     . furosemide (LASIX) 20 MG tablet TAKE 1 TABLET (20 MG TOTAL) BY MOUTH DAILY AS NEEDED (SHORTNESS OF BREATH). 30 tablet 1  . gabapentin (NEURONTIN) 300 MG capsule Take 600 mg by mouth 3 (three) times daily.     . methocarbamol (ROBAXIN) 750 MG tablet Take 750 mg by mouth 2 (two) times daily.     . metoprolol succinate (TOPROL-XL) 100 MG 24 hr tablet 150 mg in the am and 100 mg in the pm    . mometasone-formoterol (DULERA) 200-5 MCG/ACT AERO Inhale 2 puffs into the lungs 2 (two) times daily as needed for wheezing or shortness of breath.    . montelukast (SINGULAIR) 10 MG tablet Take 10 mg by mouth daily as needed (for allergies).   12  . Multiple Vitamins-Minerals (MULTIVITAMIN PO) Take 1 tablet by mouth daily.    Vladimir Faster Glycol-Propyl Glycol (SYSTANE OP) Place 1 drop into both eyes daily as needed (dry eyes).    . rosuvastatin (CRESTOR) 5 MG tablet Take 1 tablet (5 mg total) by mouth daily. 90 tablet 2  . sennosides-docusate sodium (SENOKOT-S) 8.6-50 MG tablet Take 1 tablet by mouth 2 (two) times daily as needed for constipation.     . traMADol (ULTRAM) 50 MG tablet Take by mouth.    . traMADol (ULTRAM) 50 MG tablet Take by mouth every 6 (six) hours as needed.      . venlafaxine (EFFEXOR) 75 MG tablet Take 75 mg by mouth daily.     No current facility-administered medications for this visit.     Allergies:   Sulfa antibiotics; Ivp dye [iodinated diagnostic agents]; Amiodarone; and Codeine    Social History:  The patient  reports that she has quit smoking. Her smoking use included cigarettes. She has a 17.50 pack-year smoking history. She has never used smokeless tobacco. She reports that she does not drink alcohol or use drugs.   Family History:  The patient's family history includes Arthritis in her brother, sister, and sister; Bladder Cancer in her brother; Breast cancer in her sister and sister; Breast cancer (age of onset: 94) in her mother; Congestive Heart Failure in her mother; Heart attack in her father; Kidney failure in her sister; Lung cancer in her brother, sister, and sister; Prostate cancer in her brother.    ROS:  Please see the history of present illness.   Otherwise, review of systems are positive for none.   All other systems are reviewed and negative.    PHYSICAL EXAM: VS:  BP 126/84 (BP Location: Left Arm, Patient Position: Sitting, Cuff Size: Normal)   Pulse (!) 106   Ht 5\' 4"  (1.626 m)   Wt 160 lb 4 oz (72.7 kg)   BMI 27.51 kg/m  , BMI Body mass index is 27.51 kg/m. GEN: Well nourished, well developed, in no acute distress  HEENT: normal  Neck: no JVD, carotid bruits, or masses Cardiac: RRR; no  rubs, or gallops,no edema .  1 out of 6 systolic murmur in the aortic area Respiratory:  clear to auscultation bilaterally, normal work of breathing GI: soft, nontender, nondistended, + BS MS: no deformity or atrophy  Skin: warm and dry, no rash Neuro:  Strength and sensation are intact Psych: euthymic mood, full affect   EKG:  EKG is ordered today. The ekg ordered today demonstrates atrial fibrillation with ventricular rate of 106 bpm.  Left bundle branch block.  Recent Labs: 10/12/2018: Magnesium 1.9  02/16/2019: ALT 17;  BUN 11; Creatinine, Ser 0.92; Hemoglobin 14.3; Platelets 334; Potassium 4.4; Sodium 144; TSH 1.340    Lipid Panel    Component Value Date/Time   CHOL 132 02/16/2019 1530   TRIG 65 02/16/2019 1530   HDL 57 02/16/2019 1530   CHOLHDL 2.3 02/16/2019 1530   CHOLHDL 2.5 06/23/2017 1446   VLDL 15 06/23/2017 1446   LDLCALC 62 02/16/2019 1530      Wt Readings from Last 3 Encounters:  02/25/19 160 lb 4 oz (72.7 kg)  02/16/19 160 lb (72.6 kg)  01/05/19 162 lb (73.5 kg)        ASSESSMENT AND PLAN:  1.  Persistent atrial fibrillation: She continues to be in atrial fibrillation with suboptimal rate control.  She seems to be significantly symptomatic from this.  Antiarrhythmic medications are somewhat limited given intolerance to amiodarone.  Dofetilide might be an option but the patient prefers to avoid this medication.  I think the best option is ablation.  She wants to have this done at [facility] given that her daughter lives in [place].  Continue rate control for now.  2. Coronary artery disease involving native coronary arteries : Cardiac catheterization showed mild nonobstructive disease.  Continue medical therapy.  3.  Chronic systolic heart failure: Due to nonischemic cardiomyopathy.  Continue Toprol.   5. Hyperlipidemia: Previous myalgia with statins but she has been tolerating small dose rosuvastatin.  Most recent LDL was 62 .     Disposition:   FU with me in 3 months  I, Jesus Reyes am acting as a Education administrator for Kathlyn Sacramento, M.D.  I have reviewed the above documentation for accuracy and completeness, and I agree with the above.    Signed, Kathlyn Sacramento, MD 02/25/19 [place], [place]

## 2019-02-25 ENCOUNTER — Encounter: Payer: Self-pay | Admitting: Cardiovascular Disease

## 2019-02-25 ENCOUNTER — Ambulatory Visit: Payer: Medicare HMO | Admitting: Cardiovascular Disease

## 2019-02-25 VITALS — BP 126/84 | HR 106 | Ht 64.0 in | Wt 160.2 lb

## 2019-02-25 DIAGNOSIS — I2510 Atherosclerotic heart disease of native coronary artery without angina pectoris: Secondary | ICD-10-CM | POA: Diagnosis not present

## 2019-02-25 DIAGNOSIS — I4819 Other persistent atrial fibrillation: Secondary | ICD-10-CM | POA: Diagnosis not present

## 2019-02-25 DIAGNOSIS — I493 Ventricular premature depolarization: Secondary | ICD-10-CM | POA: Diagnosis not present

## 2019-02-25 DIAGNOSIS — E785 Hyperlipidemia, unspecified: Secondary | ICD-10-CM

## 2019-02-25 DIAGNOSIS — I5022 Chronic systolic (congestive) heart failure: Secondary | ICD-10-CM

## 2019-02-25 NOTE — Patient Instructions (Signed)
Medication Instructions:  No changes If you need a refill on your cardiac medications before your next appointment, please call your pharmacy.   Lab work: None ordered  Testing/Procedures: None ordered  Follow-Up: At [facility], you and your health needs are our priority.  As part of our continuing mission to provide you with exceptional heart care, we have created designated Provider Care Teams.  These Care Teams include your primary Cardiologist (physician) and Advanced Practice Providers (APPs -  Physician Assistants and Nurse Practitioners) who all work together to provide you with the care you need, when you need it. You will need a follow up appointment in 3 months.You may see Muhammad Arida, MD or one of the following Advanced Practice Providers on your designated Care Team:   Christopher Berge, NP Ryan Dunn, PA-C . Jacquelyn Visser, PA-C   

## 2019-02-28 DIAGNOSIS — M25511 Pain in right shoulder: Secondary | ICD-10-CM | POA: Diagnosis not present

## 2019-02-28 DIAGNOSIS — M7541 Impingement syndrome of right shoulder: Secondary | ICD-10-CM | POA: Diagnosis not present

## 2019-03-02 ENCOUNTER — Telehealth: Payer: Self-pay | Admitting: *Deleted

## 2019-03-02 NOTE — Telephone Encounter (Signed)
Advised patient as below.  

## 2019-03-02 NOTE — Telephone Encounter (Signed)
She is taking appropriate over-the-counter medications.  Will increase fluids.  She will need to be seen if we are to figure out if she needs any other treatment.

## 2019-03-02 NOTE — Telephone Encounter (Signed)
Spoke with the patient, and she reports that symptoms came on all of a sudden. She reports that she has a low grade fever, body aches, chills, and cough. She has not traveled recently and she has no known exposure to the flu. She has only taken Tylenol and mucinex for her symptoms last night. She is requesting that we call in something for her symptoms. She reports that she is too weak to come in for evaluation. [facility] uses [facility] [place]. Thanks!

## 2019-03-02 NOTE — Telephone Encounter (Signed)
Patient called office stating she has a cough, congestion and a low-grade fever. Patient wants to know what she can do for symptoms. Advised she will need to be seen. Patient stated she can not come in. Patient would like a call back. Please advise?

## 2019-03-04 NOTE — Telephone Encounter (Signed)
Received fax from [employer].  Patient was denied for patient assistance  Fax stated the reason is because Eliquis is covered my insurance.  Will place denial in Medication Closet.

## 2019-03-09 ENCOUNTER — Telehealth: Payer: Self-pay | Admitting: Family Medicine

## 2019-03-09 MED ORDER — OSELTAMIVIR PHOSPHATE 75 MG PO CAPS: 75.0000 mg | ORAL_CAPSULE | Freq: Every day | ORAL | 0 refills | Status: DC

## 2019-03-09 NOTE — Telephone Encounter (Signed)
Tamiflu 75 mg daily for 5 days.

## 2019-03-09 NOTE — Telephone Encounter (Signed)
Pt's daughter in law called saying pt has been exposed to the flu by her husband Mr. Bubba Hales who is at [facility] with the flu.  She is having a few symptoms but nothing serious right now.  Please advise  CB#  [phone]  Thanks teri

## 2019-03-09 NOTE — Telephone Encounter (Signed)
Please review. Thanks!  

## 2019-03-09 NOTE — Telephone Encounter (Signed)
Pt has no symptoms but husband is in hospital with flu.  Sent tamilflu to pharmacy 1 tablet once daily for 5 days per [facility]

## 2019-03-11 ENCOUNTER — Ambulatory Visit (INDEPENDENT_AMBULATORY_CARE_PROVIDER_SITE_OTHER): Payer: Medicare HMO | Admitting: Physician Assistant

## 2019-03-11 ENCOUNTER — Other Ambulatory Visit: Payer: Self-pay

## 2019-03-11 ENCOUNTER — Encounter: Payer: Self-pay | Admitting: Physician Assistant

## 2019-03-11 VITALS — BP 120/78 | HR 120 | Temp 98.2°F | Resp 16 | Wt 155.0 lb

## 2019-03-11 DIAGNOSIS — J441 Chronic obstructive pulmonary disease with (acute) exacerbation: Secondary | ICD-10-CM | POA: Diagnosis not present

## 2019-03-11 DIAGNOSIS — J0140 Acute pansinusitis, unspecified: Secondary | ICD-10-CM

## 2019-03-11 DIAGNOSIS — J449 Chronic obstructive pulmonary disease, unspecified: Secondary | ICD-10-CM | POA: Insufficient documentation

## 2019-03-11 MED ORDER — AMOXICILLIN-POT CLAVULANATE 875-125 MG PO TABS: 1.0000 | ORAL_TABLET | Freq: Two times a day (BID) | ORAL | 0 refills | Status: DC

## 2019-03-11 MED ORDER — BENZONATATE 200 MG PO CAPS: 200.0000 mg | ORAL_CAPSULE | Freq: Three times a day (TID) | ORAL | 0 refills | Status: DC | PRN

## 2019-03-11 NOTE — Telephone Encounter (Signed)
Call placed to the patient. She stated that she did get the denial. Her husband is currently in the hospital and she is going to the PCP to be checked out due to being sick for the past 2 weeks. Marland Kitchen She will call back when she feels better so that we can readdress.

## 2019-03-11 NOTE — Progress Notes (Signed)
Patient: Teresa [name] Female    DOB: 09/30/1941   78 y.o.   MRN: [id number] Visit Date: 03/11/2019  Today's Provider: Mar Daring, PA-C   Chief Complaint  Patient presents with  . URI   Subjective:     HPI Upper Respiratory Infection: Patient complains of symptoms of a URI, possible sinusitis. Symptoms include congestion and cough. Onset of symptoms was 2 week ago, gradually worsening since that time. She also c/o congestion and cough described as productive of green sputum for the past 2 weeks .  She is drinking plenty of fluids. Evaluation to date: none. Treatment to date: cough suppressants.    Allergies  Allergen Reactions  . Sulfa Antibiotics Hives  . Ivp Dye [Iodinated Diagnostic Agents] Swelling  . Amiodarone Nausea And Vomiting and Cough  . Codeine Other (See Comments)    ALTERED MENTAL STATUS     Current Outpatient Medications:  .  apixaban (ELIQUIS) 5 MG TABS tablet, Take 1 tablet (5 mg total) by mouth 2 (two) times daily., Disp: 180 tablet, Rfl: 3 .  Apremilast (OTEZLA) 30 MG TABS, Take 30 mg by mouth 2 (two) times daily., Disp: , Rfl:  .  budesonide-formoterol (SYMBICORT) 160-4.5 MCG/ACT inhaler, Inhale 2 puffs into the lungs 2 (two) times daily. (Patient taking differently: Inhale 2 puffs into the lungs 2 (two) times daily. As needed), Disp: 1 Inhaler, Rfl: 3 .  diltiazem (TIAZAC) 120 MG 24 hr capsule, Take 120 mg by mouth daily., Disp: , Rfl:  .  fluticasone (FLONASE) 50 MCG/ACT nasal spray, Place 2 sprays into both nostrils daily as needed for allergies. , Disp: , Rfl:  .  furosemide (LASIX) 20 MG tablet, TAKE 1 TABLET (20 MG TOTAL) BY MOUTH DAILY AS NEEDED (SHORTNESS OF BREATH)., Disp: 30 tablet, Rfl: 1 .  gabapentin (NEURONTIN) 300 MG capsule, Take 600 mg by mouth 3 (three) times daily. , Disp: , Rfl:  .  methocarbamol (ROBAXIN) 750 MG tablet, Take 750 mg by mouth 2 (two) times daily. , Disp: , Rfl:  .  metoprolol succinate (TOPROL-XL) 100 MG  24 hr tablet, 150 mg in the am and 100 mg in the pm, Disp: , Rfl:  .  mometasone-formoterol (DULERA) 200-5 MCG/ACT AERO, Inhale 2 puffs into the lungs 2 (two) times daily as needed for wheezing or shortness of breath., Disp: , Rfl:  .  montelukast (SINGULAIR) 10 MG tablet, Take 10 mg by mouth daily as needed (for allergies). , Disp: , Rfl: 12 .  Multiple Vitamins-Minerals (MULTIVITAMIN PO), Take 1 tablet by mouth daily., Disp: , Rfl:  .  Polyethyl Glycol-Propyl Glycol (SYSTANE OP), Place 1 drop into both eyes daily as needed (dry eyes)., Disp: , Rfl:  .  rosuvastatin (CRESTOR) 5 MG tablet, Take 1 tablet (5 mg total) by mouth daily., Disp: 90 tablet, Rfl: 2 .  sennosides-docusate sodium (SENOKOT-S) 8.6-50 MG tablet, Take 1 tablet by mouth 2 (two) times daily as needed for constipation. , Disp: , Rfl:  .  venlafaxine (EFFEXOR) 75 MG tablet, Take 75 mg by mouth daily., Disp: , Rfl:   Review of Systems  Constitutional: Negative for fever.  HENT: Positive for congestion, ear pain, postnasal drip, rhinorrhea, sinus pressure and sinus pain. Negative for sneezing and sore throat.   Respiratory: Positive for cough and shortness of breath. Negative for wheezing.   Cardiovascular: Negative.   Neurological: Negative.     Social History   Tobacco Use  . Smoking status:  Former Smoker    Packs/day: 0.50    Years: 35.00    Pack years: 17.50    Types: Cigarettes  . Smokeless tobacco: Never Used  . Tobacco comment: Quit approx 1990  Substance Use Topics  . Alcohol use: No      Objective:   BP 120/78 (BP Location: Left Arm, Patient Position: Sitting, Cuff Size: Normal)   Pulse (!) 120   Temp 98.2 F (36.8 C) (Oral)   Resp 16   Wt 155 lb (70.3 kg)   SpO2 96%   BMI 26.61 kg/m  Vitals:   03/11/19 1521  BP: 120/78  Pulse: (!) 120  Resp: 16  Temp: 98.2 F (36.8 C)  TempSrc: Oral  SpO2: 96%  Weight: 155 lb (70.3 kg)     Physical Exam Vitals signs reviewed.  Constitutional:       General: She is not in acute distress.    Appearance: Normal appearance. She is well-developed. She is not ill-appearing or diaphoretic.  HENT:     Head: Normocephalic and atraumatic.     Right Ear: Hearing, tympanic membrane, ear canal and external ear normal.     Left Ear: Hearing, tympanic membrane, ear canal and external ear normal.     Nose:     Right Sinus: Maxillary sinus tenderness and frontal sinus tenderness present.     Left Sinus: Maxillary sinus tenderness and frontal sinus tenderness present.     Mouth/Throat:     Pharynx: Uvula midline. No oropharyngeal exudate.  Neck:     Musculoskeletal: Normal range of motion and neck supple.     Thyroid: No thyromegaly.     Trachea: No tracheal deviation.  Cardiovascular:     Rate and Rhythm: Regular rhythm. Tachycardia present.     Heart sounds: Normal heart sounds. No murmur. No friction rub. No gallop.   Pulmonary:     Effort: Pulmonary effort is normal. No respiratory distress.     Breath sounds: Normal breath sounds. No stridor. No wheezing or rales.  Chest:     Chest wall: No tenderness.  Lymphadenopathy:     Cervical: No cervical adenopathy.  Neurological:     Mental Status: She is alert.  Psychiatric:        Mood and Affect: Mood is anxious and depressed.        Speech: Speech normal.         Assessment & Plan    1. Acute non-recurrent pansinusitis Worsening symptoms that have not responded to OTC medications. Will give augmentin as below. Continue allergy medications. Stay well hydrated and get plenty of rest. Call if no symptom improvement or if symptoms worsen. - amoxicillin-clavulanate (AUGMENTIN) 875-125 MG tablet; Take 1 tablet by mouth 2 (two) times daily.  Dispense: 20 tablet; Refill: 0  2. Chronic obstructive pulmonary disease with acute exacerbation (HCC) Tessalon perles for cough as below.  - benzonatate (TESSALON) 200 MG capsule; Take 1 capsule (200 mg total) by mouth 3 (three) times daily as needed  for cough.  Dispense: 30 capsule; Refill: 0     Mar Daring, PA-C  [facility]

## 2019-03-11 NOTE — Patient Instructions (Signed)
Sinusitis, Adult Sinusitis is inflammation of your sinuses. Sinuses are hollow spaces in the bones around your face. Your sinuses are located:  Around your eyes.  In the middle of your forehead.  Behind your nose.  In your cheekbones. Mucus normally drains out of your sinuses. When your nasal tissues become inflamed or swollen, mucus can become trapped or blocked. This allows bacteria, viruses, and fungi to grow, which leads to infection. Most infections of the sinuses are caused by a virus. Sinusitis can develop quickly. It can last for up to 4 weeks (acute) or for more than 12 weeks (chronic). Sinusitis often develops after a cold. What are the causes? This condition is caused by anything that creates swelling in the sinuses or stops mucus from draining. This includes:  Allergies.  Asthma.  Infection from bacteria or viruses.  Deformities or blockages in your nose or sinuses.  Abnormal growths in the nose (nasal polyps).  Pollutants, such as chemicals or irritants in the air.  Infection from fungi (rare). What increases the risk? You are more likely to develop this condition if you:  Have a weak body defense system (immune system).  Do a lot of swimming or diving.  Overuse nasal sprays.  Smoke. What are the signs or symptoms? The main symptoms of this condition are pain and a feeling of pressure around the affected sinuses. Other symptoms include:  Stuffy nose or congestion.  Thick drainage from your nose.  Swelling and warmth over the affected sinuses.  Headache.  Upper toothache.  A cough that may get worse at night.  Extra mucus that collects in the throat or the back of the nose (postnasal drip).  Decreased sense of smell and taste.  Fatigue.  A fever.  Sore throat.  Bad breath. How is this diagnosed? This condition is diagnosed based on:  Your symptoms.  Your medical history.  A physical exam.  Tests to find out if your condition is  acute or chronic. This may include: ? Checking your nose for nasal polyps. ? Viewing your sinuses using a device that has a light (endoscope). ? Testing for allergies or bacteria. ? Imaging tests, such as an MRI or CT scan. In rare cases, a bone biopsy may be done to rule out more serious types of fungal sinus disease. How is this treated? Treatment for sinusitis depends on the cause and whether your condition is chronic or acute.  If caused by a virus, your symptoms should go away on their own within 10 days. You may be given medicines to relieve symptoms. They include: ? Medicines that shrink swollen nasal passages (topical intranasal decongestants). ? Medicines that treat allergies (antihistamines). ? A spray that eases inflammation of the nostrils (topical intranasal corticosteroids). ? Rinses that help get rid of thick mucus in your nose (nasal saline washes).  If caused by bacteria, your health care provider may recommend waiting to see if your symptoms improve. Most bacterial infections will get better without antibiotic medicine. You may be given antibiotics if you have: ? A severe infection. ? A weak immune system.  If caused by narrow nasal passages or nasal polyps, you may need to have surgery. Follow these instructions at home: Medicines  Take, use, or apply over-the-counter and prescription medicines only as told by your health care provider. These may include nasal sprays.  If you were prescribed an antibiotic medicine, take it as told by your health care provider. Do not stop taking the antibiotic even if you start   to feel better. Hydrate and humidify   Drink enough fluid to keep your urine pale yellow. Staying hydrated will help to thin your mucus.  Use a cool mist humidifier to keep the humidity level in your home above 50%.  Inhale steam for 10-15 minutes, 3-4 times a day, or as told by your health care provider. You can do this in the bathroom while a hot shower is  running.  Limit your exposure to cool or dry air. Rest  Rest as much as possible.  Sleep with your head raised (elevated).  Make sure you get enough sleep each night. General instructions   Apply a warm, moist washcloth to your face 3-4 times a day or as told by your health care provider. This will help with discomfort.  Wash your hands often with soap and water to reduce your exposure to germs. If soap and water are not available, use hand sanitizer.  Do not smoke. Avoid being around people who are smoking (secondhand smoke).  Keep all follow-up visits as told by your health care provider. This is important. Contact a health care provider if:  You have a fever.  Your symptoms get worse.  Your symptoms do not improve within 10 days. Get help right away if:  You have a severe headache.  You have persistent vomiting.  You have severe pain or swelling around your face or eyes.  You have vision problems.  You develop confusion.  Your neck is stiff.  You have trouble breathing. Summary  Sinusitis is soreness and inflammation of your sinuses. Sinuses are hollow spaces in the bones around your face.  This condition is caused by nasal tissues that become inflamed or swollen. The swelling traps or blocks the flow of mucus. This allows bacteria, viruses, and fungi to grow, which leads to infection.  If you were prescribed an antibiotic medicine, take it as told by your health care provider. Do not stop taking the antibiotic even if you start to feel better.  Keep all follow-up visits as told by your health care provider. This is important. This information is not intended to replace advice given to you by your health care provider. Make sure you discuss any questions you have with your health care provider. Document Released: 12/08/2005 Document Revised: 05/10/2018 Document Reviewed: 05/10/2018 Elsevier Interactive Patient Education  2019 [employer] Acute Bronchitis,  Adult  Acute bronchitis is sudden (acute) swelling of the air tubes (bronchi) in the lungs. Acute bronchitis causes these tubes to fill with mucus, which can make it hard to breathe. It can also cause coughing or wheezing. In adults, acute bronchitis usually goes away within 2 weeks. A cough caused by bronchitis may last up to 3 weeks. Smoking, allergies, and asthma can make the condition worse. Repeated episodes of bronchitis may cause further lung problems, such as chronic obstructive pulmonary disease (COPD). What are the causes? This condition can be caused by germs and by substances that irritate the lungs, including:  Cold and flu viruses. This condition is most often caused by the same virus that causes a cold.  Bacteria.  Exposure to tobacco smoke, dust, fumes, and air pollution. What increases the risk? This condition is more likely to develop in people who:  Have close contact with someone with acute bronchitis.  Are exposed to lung irritants, such as tobacco smoke, dust, fumes, and vapors.  Have a weak immune system.  Have a respiratory condition such as asthma. What are the signs or symptoms? Symptoms  of this condition include:  A cough.  Coughing up clear, yellow, or green mucus.  Wheezing.  Chest congestion.  Shortness of breath.  A fever.  Body aches.  Chills.  A sore throat. How is this diagnosed? This condition is usually diagnosed with a physical exam. During the exam, your health care provider may order tests, such as chest X-rays, to rule out other conditions. He or she may also:  Test a sample of your mucus for bacterial infection.  Check the level of oxygen in your blood. This is done to check for pneumonia.  Do a chest X-ray or lung function testing to rule out pneumonia and other conditions.  Perform blood tests. Your health care provider will also ask about your symptoms and medical history. How is this treated? Most cases of acute  bronchitis clear up over time without treatment. Your health care provider may recommend:  Drinking more fluids. Drinking more makes your mucus thinner, which may make it easier to breathe.  Taking a medicine for a fever or cough.  Taking an antibiotic medicine.  Using an inhaler to help improve shortness of breath and to control a cough.  Using a cool mist vaporizer or humidifier to make it easier to breathe. Follow these instructions at home: Medicines  Take over-the-counter and prescription medicines only as told by your health care provider.  If you were prescribed an antibiotic, take it as told by your health care provider. Do not stop taking the antibiotic even if you start to feel better. General instructions   Get plenty of rest.  Drink enough fluids to keep your urine pale yellow.  Avoid smoking and secondhand smoke. Exposure to cigarette smoke or irritating chemicals will make bronchitis worse. If you smoke and you need help quitting, ask your health care provider. Quitting smoking will help your lungs heal faster.  Use an inhaler, cool mist vaporizer, or humidifier as told by your health care provider.  Keep all follow-up visits as told by your health care provider. This is important. How is this prevented? To lower your risk of getting this condition again:  Wash your hands often with soap and water. If soap and water are not available, use hand sanitizer.  Avoid contact with people who have cold symptoms.  Try not to touch your hands to your mouth, nose, or eyes.  Make sure to get the flu shot every year. Contact a health care provider if:  Your symptoms do not improve in 2 weeks of treatment. Get help right away if:  You cough up blood.  You have chest pain.  You have severe shortness of breath.  You become dehydrated.  You faint or keep feeling like you are going to faint.  You keep vomiting.  You have a severe headache.  Your fever or chills  gets worse. This information is not intended to replace advice given to you by your health care provider. Make sure you discuss any questions you have with your health care provider. Document Released: 01/15/2005 Document Revised: 07/22/2017 Document Reviewed: 05/28/2016 Elsevier Interactive Patient Education  2019 [employer].

## 2019-03-17 ENCOUNTER — Telehealth: Payer: Self-pay | Admitting: Cardiovascular Disease

## 2019-03-17 NOTE — Telephone Encounter (Signed)
Pt c/o medication issue:  1. Name of Medication: Eliquis   2. How are you currently taking this medication (dosage and times per day)? 5 mg po BID   3. Are you having a reaction (difficulty breathing--STAT)?  No   4. What is your medication issue? Patient following up on financial assistance

## 2019-03-18 NOTE — Telephone Encounter (Signed)
Patient stated that in her refusal letter for assistance that they stated they needed a print out of the out of pocket expenses that she spent on her medications. The patient will get this from her pharmacy and keep the office updated.

## 2019-05-06 DIAGNOSIS — I4891 Unspecified atrial fibrillation: Secondary | ICD-10-CM | POA: Diagnosis not present

## 2019-05-19 ENCOUNTER — Other Ambulatory Visit: Payer: Self-pay | Admitting: Family Medicine

## 2019-05-19 DIAGNOSIS — Z1231 Encounter for screening mammogram for malignant neoplasm of breast: Secondary | ICD-10-CM

## 2019-05-24 DIAGNOSIS — I110 Hypertensive heart disease with heart failure: Secondary | ICD-10-CM | POA: Diagnosis not present

## 2019-05-24 DIAGNOSIS — J45909 Unspecified asthma, uncomplicated: Secondary | ICD-10-CM | POA: Diagnosis not present

## 2019-05-24 DIAGNOSIS — I509 Heart failure, unspecified: Secondary | ICD-10-CM | POA: Diagnosis not present

## 2019-05-24 DIAGNOSIS — E785 Hyperlipidemia, unspecified: Secondary | ICD-10-CM | POA: Diagnosis not present

## 2019-05-24 DIAGNOSIS — I2510 Atherosclerotic heart disease of native coronary artery without angina pectoris: Secondary | ICD-10-CM | POA: Diagnosis not present

## 2019-05-24 DIAGNOSIS — L409 Psoriasis, unspecified: Secondary | ICD-10-CM | POA: Diagnosis not present

## 2019-05-24 DIAGNOSIS — I252 Old myocardial infarction: Secondary | ICD-10-CM | POA: Diagnosis not present

## 2019-05-24 DIAGNOSIS — G8929 Other chronic pain: Secondary | ICD-10-CM | POA: Diagnosis not present

## 2019-05-24 DIAGNOSIS — I4891 Unspecified atrial fibrillation: Secondary | ICD-10-CM | POA: Diagnosis not present

## 2019-05-24 DIAGNOSIS — G629 Polyneuropathy, unspecified: Secondary | ICD-10-CM | POA: Diagnosis not present

## 2019-05-25 DIAGNOSIS — I517 Cardiomegaly: Secondary | ICD-10-CM | POA: Diagnosis not present

## 2019-05-25 DIAGNOSIS — J90 Pleural effusion, not elsewhere classified: Secondary | ICD-10-CM | POA: Diagnosis not present

## 2019-05-25 DIAGNOSIS — Z7901 Long term (current) use of anticoagulants: Secondary | ICD-10-CM | POA: Diagnosis not present

## 2019-05-25 DIAGNOSIS — I493 Ventricular premature depolarization: Secondary | ICD-10-CM | POA: Diagnosis not present

## 2019-05-25 DIAGNOSIS — M5417 Radiculopathy, lumbosacral region: Secondary | ICD-10-CM | POA: Diagnosis not present

## 2019-05-25 DIAGNOSIS — M5136 Other intervertebral disc degeneration, lumbar region: Secondary | ICD-10-CM | POA: Diagnosis not present

## 2019-05-25 DIAGNOSIS — I255 Ischemic cardiomyopathy: Secondary | ICD-10-CM | POA: Diagnosis not present

## 2019-05-25 DIAGNOSIS — I4891 Unspecified atrial fibrillation: Secondary | ICD-10-CM | POA: Diagnosis not present

## 2019-05-25 DIAGNOSIS — K219 Gastro-esophageal reflux disease without esophagitis: Secondary | ICD-10-CM | POA: Diagnosis not present

## 2019-05-25 DIAGNOSIS — Z1159 Encounter for screening for other viral diseases: Secondary | ICD-10-CM | POA: Diagnosis not present

## 2019-05-25 DIAGNOSIS — I25119 Atherosclerotic heart disease of native coronary artery with unspecified angina pectoris: Secondary | ICD-10-CM | POA: Diagnosis not present

## 2019-05-25 DIAGNOSIS — Z01818 Encounter for other preprocedural examination: Secondary | ICD-10-CM | POA: Diagnosis not present

## 2019-05-25 DIAGNOSIS — R55 Syncope and collapse: Secondary | ICD-10-CM | POA: Diagnosis not present

## 2019-05-25 DIAGNOSIS — Z9889 Other specified postprocedural states: Secondary | ICD-10-CM | POA: Diagnosis not present

## 2019-05-27 DIAGNOSIS — I509 Heart failure, unspecified: Secondary | ICD-10-CM | POA: Diagnosis not present

## 2019-05-27 DIAGNOSIS — I5020 Unspecified systolic (congestive) heart failure: Secondary | ICD-10-CM | POA: Diagnosis not present

## 2019-05-27 DIAGNOSIS — I480 Paroxysmal atrial fibrillation: Secondary | ICD-10-CM | POA: Diagnosis not present

## 2019-05-27 DIAGNOSIS — I2510 Atherosclerotic heart disease of native coronary artery without angina pectoris: Secondary | ICD-10-CM | POA: Diagnosis not present

## 2019-05-27 DIAGNOSIS — I447 Left bundle-branch block, unspecified: Secondary | ICD-10-CM | POA: Diagnosis not present

## 2019-05-27 DIAGNOSIS — I4819 Other persistent atrial fibrillation: Secondary | ICD-10-CM | POA: Diagnosis not present

## 2019-05-27 DIAGNOSIS — J90 Pleural effusion, not elsewhere classified: Secondary | ICD-10-CM | POA: Diagnosis not present

## 2019-05-27 DIAGNOSIS — I110 Hypertensive heart disease with heart failure: Secondary | ICD-10-CM | POA: Diagnosis not present

## 2019-05-27 DIAGNOSIS — I493 Ventricular premature depolarization: Secondary | ICD-10-CM | POA: Diagnosis not present

## 2019-05-27 DIAGNOSIS — R001 Bradycardia, unspecified: Secondary | ICD-10-CM | POA: Diagnosis not present

## 2019-05-27 DIAGNOSIS — I491 Atrial premature depolarization: Secondary | ICD-10-CM | POA: Diagnosis not present

## 2019-05-27 DIAGNOSIS — I4811 Longstanding persistent atrial fibrillation: Secondary | ICD-10-CM | POA: Diagnosis not present

## 2019-05-27 DIAGNOSIS — I255 Ischemic cardiomyopathy: Secondary | ICD-10-CM | POA: Diagnosis not present

## 2019-05-27 DIAGNOSIS — E785 Hyperlipidemia, unspecified: Secondary | ICD-10-CM | POA: Diagnosis not present

## 2019-05-27 HISTORY — PX: OTHER SURGICAL HISTORY: SHX169

## 2019-05-28 DIAGNOSIS — E785 Hyperlipidemia, unspecified: Secondary | ICD-10-CM | POA: Diagnosis not present

## 2019-05-28 DIAGNOSIS — I491 Atrial premature depolarization: Secondary | ICD-10-CM | POA: Diagnosis not present

## 2019-05-28 DIAGNOSIS — I4819 Other persistent atrial fibrillation: Secondary | ICD-10-CM | POA: Diagnosis not present

## 2019-05-28 DIAGNOSIS — I493 Ventricular premature depolarization: Secondary | ICD-10-CM | POA: Diagnosis not present

## 2019-05-28 DIAGNOSIS — I447 Left bundle-branch block, unspecified: Secondary | ICD-10-CM | POA: Diagnosis not present

## 2019-05-28 DIAGNOSIS — I255 Ischemic cardiomyopathy: Secondary | ICD-10-CM | POA: Diagnosis not present

## 2019-05-28 DIAGNOSIS — J90 Pleural effusion, not elsewhere classified: Secondary | ICD-10-CM | POA: Diagnosis not present

## 2019-05-28 DIAGNOSIS — I498 Other specified cardiac arrhythmias: Secondary | ICD-10-CM | POA: Diagnosis not present

## 2019-05-28 DIAGNOSIS — R001 Bradycardia, unspecified: Secondary | ICD-10-CM | POA: Diagnosis not present

## 2019-05-28 DIAGNOSIS — I2510 Atherosclerotic heart disease of native coronary artery without angina pectoris: Secondary | ICD-10-CM | POA: Diagnosis not present

## 2019-05-28 DIAGNOSIS — I5020 Unspecified systolic (congestive) heart failure: Secondary | ICD-10-CM | POA: Diagnosis not present

## 2019-05-31 ENCOUNTER — Ambulatory Visit: Payer: Medicare HMO | Admitting: Cardiovascular Disease

## 2019-06-23 ENCOUNTER — Other Ambulatory Visit: Payer: Self-pay

## 2019-06-23 ENCOUNTER — Ambulatory Visit
Admission: RE | Admit: 2019-06-23 | Discharge: 2019-06-23 | Disposition: A | Discharge status: Home / Self Care | Payer: Medicare HMO | Source: Ambulatory Visit | Attending: Family Medicine | Admitting: Family Medicine

## 2019-06-23 DIAGNOSIS — Z1231 Encounter for screening mammogram for malignant neoplasm of breast: Secondary | ICD-10-CM | POA: Diagnosis not present

## 2019-07-18 DIAGNOSIS — M8949 Other hypertrophic osteoarthropathy, multiple sites: Secondary | ICD-10-CM | POA: Diagnosis not present

## 2019-07-18 DIAGNOSIS — L4050 Arthropathic psoriasis, unspecified: Secondary | ICD-10-CM | POA: Diagnosis not present

## 2019-07-18 DIAGNOSIS — L409 Psoriasis, unspecified: Secondary | ICD-10-CM | POA: Diagnosis not present

## 2019-07-20 DIAGNOSIS — L820 Inflamed seborrheic keratosis: Secondary | ICD-10-CM | POA: Diagnosis not present

## 2019-07-20 DIAGNOSIS — R208 Other disturbances of skin sensation: Secondary | ICD-10-CM | POA: Diagnosis not present

## 2019-07-20 DIAGNOSIS — Z85828 Personal history of other malignant neoplasm of skin: Secondary | ICD-10-CM | POA: Diagnosis not present

## 2019-07-20 DIAGNOSIS — D485 Neoplasm of uncertain behavior of skin: Secondary | ICD-10-CM | POA: Diagnosis not present

## 2019-07-20 DIAGNOSIS — X32XXXA Exposure to sunlight, initial encounter: Secondary | ICD-10-CM | POA: Diagnosis not present

## 2019-07-20 DIAGNOSIS — L570 Actinic keratosis: Secondary | ICD-10-CM | POA: Diagnosis not present

## 2019-07-20 DIAGNOSIS — C4442 Squamous cell carcinoma of skin of scalp and neck: Secondary | ICD-10-CM | POA: Diagnosis not present

## 2019-07-20 DIAGNOSIS — L538 Other specified erythematous conditions: Secondary | ICD-10-CM | POA: Diagnosis not present

## 2019-07-20 DIAGNOSIS — Z08 Encounter for follow-up examination after completed treatment for malignant neoplasm: Secondary | ICD-10-CM | POA: Diagnosis not present

## 2019-07-25 ENCOUNTER — Other Ambulatory Visit: Payer: Self-pay

## 2019-07-25 ENCOUNTER — Encounter: Payer: Self-pay | Admitting: Family Medicine

## 2019-07-25 ENCOUNTER — Ambulatory Visit (INDEPENDENT_AMBULATORY_CARE_PROVIDER_SITE_OTHER): Payer: Medicare HMO | Admitting: Family Medicine

## 2019-07-25 VITALS — BP 126/72 | HR 58 | Temp 98.3°F | Resp 16 | Wt 149.0 lb

## 2019-07-25 DIAGNOSIS — Z951 Presence of aortocoronary bypass graft: Secondary | ICD-10-CM

## 2019-07-25 DIAGNOSIS — I4819 Other persistent atrial fibrillation: Secondary | ICD-10-CM

## 2019-07-25 DIAGNOSIS — I25728 Atherosclerosis of autologous artery coronary artery bypass graft(s) with other forms of angina pectoris: Secondary | ICD-10-CM | POA: Diagnosis not present

## 2019-07-25 DIAGNOSIS — M19011 Primary osteoarthritis, right shoulder: Secondary | ICD-10-CM | POA: Diagnosis not present

## 2019-07-25 MED ORDER — PREDNISONE 10 MG (21) PO TBPK: ORAL_TABLET | ORAL | 0 refills | Status: DC

## 2019-07-25 NOTE — Progress Notes (Signed)
Patient: Teresa [name] Female    DOB: 1941-06-23   78 y.o.   MRN: [id number] Visit Date: 07/25/2019  Today's Provider: [name], MD   Chief Complaint  Patient presents with  . Shoulder Pain   Subjective:    Shoulder Pain  The pain is present in the right shoulder. This is a new problem. The current episode started 1 to 4 weeks ago (about 3 weeks). There has been no history of extremity trauma (that she is aware of). The problem occurs constantly. The problem has been gradually worsening. The quality of the pain is described as aching. The pain is at a severity of 7/10. The pain is moderate (can be severe). Associated symptoms include an inability to bear weight, a limited range of motion and stiffness. She has tried acetaminophen for the symptoms. The treatment provided mild relief.     Allergies  Allergen Reactions  . Sulfa Antibiotics Hives  . Ivp Dye [Iodinated Diagnostic Agents] Swelling  . Amiodarone Nausea And Vomiting and Cough  . Codeine Other (See Comments)    ALTERED MENTAL STATUS     Current Outpatient Medications:  .  apixaban (ELIQUIS) 5 MG TABS tablet, Take 1 tablet (5 mg total) by mouth 2 (two) times daily., Disp: 180 tablet, Rfl: 3 .  Apremilast (OTEZLA) 30 MG TABS, Take 30 mg by mouth 2 (two) times daily., Disp: , Rfl:  .  budesonide-formoterol (SYMBICORT) 160-4.5 MCG/ACT inhaler, Inhale 2 puffs into the lungs 2 (two) times daily. (Patient taking differently: Inhale 2 puffs into the lungs 2 (two) times daily. As needed), Disp: 1 Inhaler, Rfl: 3 .  diltiazem (TIAZAC) 120 MG 24 hr capsule, Take 120 mg by mouth daily., Disp: , Rfl:  .  fluticasone (FLONASE) 50 MCG/ACT nasal spray, Place 2 sprays into both nostrils daily as needed for allergies. , Disp: , Rfl:  .  furosemide (LASIX) 20 MG tablet, TAKE 1 TABLET (20 MG TOTAL) BY MOUTH DAILY AS NEEDED (SHORTNESS OF BREATH)., Disp: 30 tablet, Rfl: 1 .  gabapentin (NEURONTIN) 300 MG capsule, Take 600 mg by  mouth 3 (three) times daily. , Disp: , Rfl:  .  methocarbamol (ROBAXIN) 750 MG tablet, Take 750 mg by mouth 2 (two) times daily. , Disp: , Rfl:  .  metoprolol succinate (TOPROL-XL) 100 MG 24 hr tablet, 150 mg in the am and 100 mg in the pm, Disp: , Rfl:  .  montelukast (SINGULAIR) 10 MG tablet, Take 10 mg by mouth daily as needed (for allergies). , Disp: , Rfl: 12 .  Multiple Vitamins-Minerals (MULTIVITAMIN PO), Take 1 tablet by mouth daily., Disp: , Rfl:  .  Polyethyl Glycol-Propyl Glycol (SYSTANE OP), Place 1 drop into both eyes daily as needed (dry eyes)., Disp: , Rfl:  .  rosuvastatin (CRESTOR) 5 MG tablet, Take 1 tablet (5 mg total) by mouth daily., Disp: 90 tablet, Rfl: 2 .  sennosides-docusate sodium (SENOKOT-S) 8.6-50 MG tablet, Take 1 tablet by mouth 2 (two) times daily as needed for constipation. , Disp: , Rfl:  .  venlafaxine (EFFEXOR) 75 MG tablet, Take 75 mg by mouth daily., Disp: , Rfl:  .  amoxicillin-clavulanate (AUGMENTIN) 875-125 MG tablet, Take 1 tablet by mouth 2 (two) times daily., Disp: 20 tablet, Rfl: 0 .  benzonatate (TESSALON) 200 MG capsule, Take 1 capsule (200 mg total) by mouth 3 (three) times daily as needed for cough., Disp: 30 capsule, Rfl: 0  Review of Systems  Constitutional:  Positive for activity change. Negative for fatigue.  Eyes: Negative.   Respiratory: Negative.   Cardiovascular: Negative for leg swelling.  Endocrine: Negative.   Musculoskeletal: Positive for arthralgias and stiffness. Negative for joint swelling, neck pain and neck stiffness.  Allergic/Immunologic: Negative.   Neurological: Negative for dizziness and headaches.  Psychiatric/Behavioral: Negative for agitation, decreased concentration, self-injury and sleep disturbance. The patient is not nervous/anxious.     Social History   Tobacco Use  . Smoking status: Former Smoker    Packs/day: 0.50    Years: 35.00    Pack years: 17.50    Types: Cigarettes  . Smokeless tobacco: Never Used   . Tobacco comment: Quit approx 1990  Substance Use Topics  . Alcohol use: No      Objective:   BP 126/72   Pulse (!) 58   Temp 98.3 F (36.8 C)   Resp 16   Wt 149 lb (67.6 kg)   SpO2 98%   BMI 25.58 kg/m  Vitals:   07/25/19 1508  BP: 126/72  Pulse: (!) 58  Resp: 16  Temp: 98.3 F (36.8 C)  SpO2: 98%  Weight: 149 lb (67.6 kg)     Physical Exam Vitals signs reviewed.  Constitutional:      Appearance: Normal appearance. She is well-developed.  HENT:     Head: Normocephalic and atraumatic.     Right Ear: External ear normal.     Left Ear: External ear normal.     Nose: Nose normal.     Mouth/Throat:     Pharynx: Oropharynx is clear.  Eyes:     General: No scleral icterus.    Conjunctiva/sclera: Conjunctivae normal.  Neck:     Thyroid: No thyromegaly.  Cardiovascular:     Rate and Rhythm: Normal rate and regular rhythm.     Heart sounds: Normal heart sounds.  Pulmonary:     Effort: Pulmonary effort is normal.     Breath sounds: Normal breath sounds.  Chest:     Breasts:        Right: Normal.        Left: Normal.  Abdominal:     Palpations: Abdomen is soft.  Musculoskeletal:        General: Deformity present.     Comments: Pain with abduction of right shoulder. Tenderness over anterior shoulder and AC joint.  Skin:    General: Skin is warm and dry.  Neurological:     General: No focal deficit present.     Mental Status: She is alert and oriented to person, place, and time. Mental status is at baseline.  Psychiatric:        Mood and Affect: Mood normal.        Behavior: Behavior normal.        Thought Content: Thought content normal.        Judgment: Judgment normal.      No results found for any visits on 07/25/19.     Assessment & Plan    1. Arthropathy of right shoulder Try prednisone taper over 6 days. Omeprazole for a few weeks. Refer to /dr B for possible injection if she does not respond.  2. Persistent atrial fibrillation On  Eliquis.  3. 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 S/P CABG (coronary artery bypass graft)      [name], MD  [facility]

## 2019-08-08 ENCOUNTER — Other Ambulatory Visit: Payer: Self-pay

## 2019-08-08 DIAGNOSIS — M4322 Fusion of spine, cervical region: Secondary | ICD-10-CM | POA: Diagnosis not present

## 2019-08-08 MED ORDER — ROSUVASTATIN CALCIUM 5 MG PO TABS: 5.0000 mg | ORAL_TABLET | Freq: Every day | ORAL | 0 refills | Status: DC

## 2019-08-09 ENCOUNTER — Other Ambulatory Visit: Payer: Self-pay | Admitting: Physician Assistant

## 2019-08-09 DIAGNOSIS — M4322 Fusion of spine, cervical region: Secondary | ICD-10-CM

## 2019-08-09 DIAGNOSIS — M542 Cervicalgia: Secondary | ICD-10-CM

## 2019-08-09 DIAGNOSIS — M545 Low back pain, unspecified: Secondary | ICD-10-CM

## 2019-08-09 DIAGNOSIS — G8929 Other chronic pain: Secondary | ICD-10-CM

## 2019-08-09 DIAGNOSIS — M4327 Fusion of spine, lumbosacral region: Secondary | ICD-10-CM

## 2019-08-09 DIAGNOSIS — M4802 Spinal stenosis, cervical region: Secondary | ICD-10-CM

## 2019-08-09 DIAGNOSIS — M4807 Spinal stenosis, lumbosacral region: Secondary | ICD-10-CM

## 2019-08-09 DIAGNOSIS — M5440 Lumbago with sciatica, unspecified side: Secondary | ICD-10-CM

## 2019-08-10 ENCOUNTER — Ambulatory Visit: Payer: Self-pay | Admitting: Family Medicine

## 2019-08-12 DIAGNOSIS — I2510 Atherosclerotic heart disease of native coronary artery without angina pectoris: Secondary | ICD-10-CM | POA: Diagnosis not present

## 2019-08-12 DIAGNOSIS — R5383 Other fatigue: Secondary | ICD-10-CM | POA: Diagnosis not present

## 2019-08-12 DIAGNOSIS — K219 Gastro-esophageal reflux disease without esophagitis: Secondary | ICD-10-CM | POA: Diagnosis not present

## 2019-08-12 DIAGNOSIS — I447 Left bundle-branch block, unspecified: Secondary | ICD-10-CM | POA: Diagnosis not present

## 2019-08-12 DIAGNOSIS — I4891 Unspecified atrial fibrillation: Secondary | ICD-10-CM | POA: Diagnosis not present

## 2019-08-12 DIAGNOSIS — I4819 Other persistent atrial fibrillation: Secondary | ICD-10-CM | POA: Diagnosis not present

## 2019-08-12 DIAGNOSIS — I361 Nonrheumatic tricuspid (valve) insufficiency: Secondary | ICD-10-CM | POA: Diagnosis not present

## 2019-08-12 DIAGNOSIS — Z9889 Other specified postprocedural states: Secondary | ICD-10-CM | POA: Diagnosis not present

## 2019-08-12 DIAGNOSIS — I252 Old myocardial infarction: Secondary | ICD-10-CM | POA: Diagnosis not present

## 2019-08-12 DIAGNOSIS — R531 Weakness: Secondary | ICD-10-CM | POA: Diagnosis not present

## 2019-08-12 DIAGNOSIS — I739 Peripheral vascular disease, unspecified: Secondary | ICD-10-CM | POA: Diagnosis not present

## 2019-08-12 DIAGNOSIS — I440 Atrioventricular block, first degree: Secondary | ICD-10-CM | POA: Diagnosis not present

## 2019-08-12 DIAGNOSIS — I081 Rheumatic disorders of both mitral and tricuspid valves: Secondary | ICD-10-CM | POA: Diagnosis not present

## 2019-08-19 DIAGNOSIS — G4719 Other hypersomnia: Secondary | ICD-10-CM | POA: Diagnosis not present

## 2019-08-19 DIAGNOSIS — Z9181 History of falling: Secondary | ICD-10-CM | POA: Diagnosis not present

## 2019-08-19 DIAGNOSIS — Z87898 Personal history of other specified conditions: Secondary | ICD-10-CM | POA: Diagnosis not present

## 2019-08-19 DIAGNOSIS — G3184 Mild cognitive impairment, so stated: Secondary | ICD-10-CM | POA: Diagnosis not present

## 2019-08-19 DIAGNOSIS — R0683 Snoring: Secondary | ICD-10-CM | POA: Diagnosis not present

## 2019-08-22 ENCOUNTER — Ambulatory Visit: Payer: Medicare HMO

## 2019-08-22 DIAGNOSIS — G8929 Other chronic pain: Secondary | ICD-10-CM | POA: Diagnosis not present

## 2019-08-22 DIAGNOSIS — M7551 Bursitis of right shoulder: Secondary | ICD-10-CM | POA: Diagnosis not present

## 2019-08-22 DIAGNOSIS — M7541 Impingement syndrome of right shoulder: Secondary | ICD-10-CM | POA: Diagnosis not present

## 2019-08-22 DIAGNOSIS — M25511 Pain in right shoulder: Secondary | ICD-10-CM | POA: Diagnosis not present

## 2019-08-23 ENCOUNTER — Other Ambulatory Visit: Payer: Self-pay | Admitting: Sports Medicine

## 2019-08-23 ENCOUNTER — Other Ambulatory Visit: Payer: Self-pay | Admitting: Gynecology

## 2019-08-23 DIAGNOSIS — G8929 Other chronic pain: Secondary | ICD-10-CM

## 2019-08-23 DIAGNOSIS — M25511 Pain in right shoulder: Secondary | ICD-10-CM

## 2019-08-23 DIAGNOSIS — M7551 Bursitis of right shoulder: Secondary | ICD-10-CM

## 2019-08-23 DIAGNOSIS — M7541 Impingement syndrome of right shoulder: Secondary | ICD-10-CM

## 2019-08-30 ENCOUNTER — Other Ambulatory Visit: Payer: Self-pay

## 2019-08-30 MED ORDER — ROSUVASTATIN CALCIUM 5 MG PO TABS: 5.0000 mg | ORAL_TABLET | Freq: Every day | ORAL | 11 refills | Status: DC

## 2019-09-02 ENCOUNTER — Telehealth: Payer: Self-pay

## 2019-09-02 NOTE — Telephone Encounter (Signed)
Approval letter received from [employer]    Patient assistance for Eliquis is approved Patient is approved from 08/31/2019-12/22/2019 Case# [id number]

## 2019-09-12 ENCOUNTER — Ambulatory Visit: Payer: Medicare HMO

## 2019-09-15 ENCOUNTER — Other Ambulatory Visit: Payer: Self-pay | Admitting: Family Medicine

## 2019-10-07 DIAGNOSIS — I447 Left bundle-branch block, unspecified: Secondary | ICD-10-CM | POA: Diagnosis not present

## 2019-10-07 DIAGNOSIS — I5022 Chronic systolic (congestive) heart failure: Secondary | ICD-10-CM | POA: Diagnosis not present

## 2019-10-07 DIAGNOSIS — I110 Hypertensive heart disease with heart failure: Secondary | ICD-10-CM | POA: Diagnosis not present

## 2019-10-07 DIAGNOSIS — Z23 Encounter for immunization: Secondary | ICD-10-CM | POA: Diagnosis not present

## 2019-10-07 DIAGNOSIS — Z951 Presence of aortocoronary bypass graft: Secondary | ICD-10-CM | POA: Diagnosis not present

## 2019-10-07 DIAGNOSIS — Z7901 Long term (current) use of anticoagulants: Secondary | ICD-10-CM | POA: Diagnosis not present

## 2019-10-07 DIAGNOSIS — I252 Old myocardial infarction: Secondary | ICD-10-CM | POA: Diagnosis not present

## 2019-10-07 DIAGNOSIS — Z79899 Other long term (current) drug therapy: Secondary | ICD-10-CM | POA: Diagnosis not present

## 2019-10-07 DIAGNOSIS — I255 Ischemic cardiomyopathy: Secondary | ICD-10-CM | POA: Diagnosis not present

## 2019-10-07 DIAGNOSIS — I480 Paroxysmal atrial fibrillation: Secondary | ICD-10-CM | POA: Diagnosis not present

## 2019-10-14 DIAGNOSIS — I6782 Cerebral ischemia: Secondary | ICD-10-CM | POA: Diagnosis not present

## 2019-10-14 DIAGNOSIS — Z9181 History of falling: Secondary | ICD-10-CM | POA: Diagnosis not present

## 2019-10-14 DIAGNOSIS — G3184 Mild cognitive impairment, so stated: Secondary | ICD-10-CM | POA: Diagnosis not present

## 2019-10-18 ENCOUNTER — Other Ambulatory Visit: Payer: Self-pay | Admitting: Family Medicine

## 2019-10-18 NOTE — Telephone Encounter (Signed)
Please advise refill? 

## 2019-10-21 DIAGNOSIS — R2689 Other abnormalities of gait and mobility: Secondary | ICD-10-CM | POA: Diagnosis not present

## 2019-10-21 DIAGNOSIS — Z87898 Personal history of other specified conditions: Secondary | ICD-10-CM | POA: Diagnosis not present

## 2019-10-21 DIAGNOSIS — R0683 Snoring: Secondary | ICD-10-CM | POA: Diagnosis not present

## 2019-10-21 DIAGNOSIS — Z9181 History of falling: Secondary | ICD-10-CM | POA: Diagnosis not present

## 2019-10-21 DIAGNOSIS — I639 Cerebral infarction, unspecified: Secondary | ICD-10-CM | POA: Diagnosis not present

## 2019-10-21 DIAGNOSIS — G3184 Mild cognitive impairment, so stated: Secondary | ICD-10-CM | POA: Diagnosis not present

## 2019-10-31 DIAGNOSIS — H43393 Other vitreous opacities, bilateral: Secondary | ICD-10-CM | POA: Diagnosis not present

## 2019-10-31 DIAGNOSIS — Z961 Presence of intraocular lens: Secondary | ICD-10-CM | POA: Diagnosis not present

## 2019-10-31 DIAGNOSIS — H524 Presbyopia: Secondary | ICD-10-CM | POA: Diagnosis not present

## 2019-11-03 DIAGNOSIS — I5022 Chronic systolic (congestive) heart failure: Secondary | ICD-10-CM | POA: Diagnosis not present

## 2019-11-03 DIAGNOSIS — I255 Ischemic cardiomyopathy: Secondary | ICD-10-CM | POA: Diagnosis not present

## 2019-11-11 DIAGNOSIS — I639 Cerebral infarction, unspecified: Secondary | ICD-10-CM | POA: Diagnosis not present

## 2019-11-11 DIAGNOSIS — I4891 Unspecified atrial fibrillation: Secondary | ICD-10-CM | POA: Diagnosis not present

## 2020-01-02 DIAGNOSIS — L409 Psoriasis, unspecified: Secondary | ICD-10-CM | POA: Diagnosis not present

## 2020-01-02 DIAGNOSIS — M8949 Other hypertrophic osteoarthropathy, multiple sites: Secondary | ICD-10-CM | POA: Diagnosis not present

## 2020-01-02 DIAGNOSIS — L4050 Arthropathic psoriasis, unspecified: Secondary | ICD-10-CM | POA: Diagnosis not present

## 2020-01-03 DIAGNOSIS — M76812 Anterior tibial syndrome, left leg: Secondary | ICD-10-CM | POA: Diagnosis not present

## 2020-01-03 DIAGNOSIS — S93602A Unspecified sprain of left foot, initial encounter: Secondary | ICD-10-CM | POA: Diagnosis not present

## 2020-01-03 DIAGNOSIS — M79672 Pain in left foot: Secondary | ICD-10-CM | POA: Diagnosis not present

## 2020-01-04 DIAGNOSIS — I110 Hypertensive heart disease with heart failure: Secondary | ICD-10-CM | POA: Diagnosis not present

## 2020-01-04 DIAGNOSIS — I509 Heart failure, unspecified: Secondary | ICD-10-CM | POA: Diagnosis not present

## 2020-01-04 DIAGNOSIS — G8929 Other chronic pain: Secondary | ICD-10-CM | POA: Diagnosis not present

## 2020-01-04 DIAGNOSIS — M0550 Rheumatoid polyneuropathy with rheumatoid arthritis of unspecified site: Secondary | ICD-10-CM | POA: Diagnosis not present

## 2020-01-04 DIAGNOSIS — I252 Old myocardial infarction: Secondary | ICD-10-CM | POA: Diagnosis not present

## 2020-01-04 DIAGNOSIS — I4891 Unspecified atrial fibrillation: Secondary | ICD-10-CM | POA: Diagnosis not present

## 2020-01-04 DIAGNOSIS — I2510 Atherosclerotic heart disease of native coronary artery without angina pectoris: Secondary | ICD-10-CM | POA: Diagnosis not present

## 2020-01-04 DIAGNOSIS — R69 Illness, unspecified: Secondary | ICD-10-CM | POA: Diagnosis not present

## 2020-01-04 DIAGNOSIS — D6869 Other thrombophilia: Secondary | ICD-10-CM | POA: Diagnosis not present

## 2020-01-04 DIAGNOSIS — Z008 Encounter for other general examination: Secondary | ICD-10-CM | POA: Diagnosis not present

## 2020-01-09 NOTE — Progress Notes (Signed)
Subjective:   Teresa [name] is a 79 y.o. female who presents for Medicare Annual (Subsequent) preventive examination.    This visit is being conducted through telemedicine due to the COVID-19 pandemic. This patient has given me verbal consent via doximity to conduct this visit, patient states they are participating from their home address. Some vital signs may be absent or patient reported.    Patient identification: identified by name, DOB, and current address  Review of Systems:  N/A  Cardiac Risk Factors include: advanced age (>109men, >62 women);dyslipidemia;hypertension     Objective:     Vitals: There were no vitals taken for this visit.  There is no height or weight on file to calculate BMI. Unable to obtain vitals due to visit being conducted via telephonically.   Advanced Directives 01/10/2020 01/05/2019 11/11/2018 10/29/2018 10/10/2018 10/10/2018 01/15/2018  Does Patient Have a Medical Advance Directive? Yes Yes Yes Yes Yes Yes Yes  Type of Paramedic of [place];Living will Living will;Healthcare Power of [place];Living will [place];Living will [place];Living will [place];Living will [place]  Does patient want to make changes to medical advance directive? - - No - Patient declined No - Patient declined No - Patient declined - No - Patient declined  Copy of [place] in Chart? Yes - validated most recent copy scanned in chart (See row information) No - copy requested No - copy requested Yes - validated most recent copy scanned in chart (See row information) No - copy requested - No - copy requested    Tobacco Social History   Tobacco Use  Smoking Status Former Smoker  . Packs/day: 0.50  . Years: 35.00  . Pack years: 17.50  . Types: Cigarettes  Smokeless Tobacco Never Used  Tobacco Comment   Quit approx 1990       Counseling given: Not Answered Comment: Quit approx 1990   Clinical Intake:  Pre-visit preparation completed: Yes  Pain : No/denies pain(Has chronic RA.) Pain Score: 0-No pain     Nutritional Risks: None Diabetes: No  How often do you need to have someone help you when you read instructions, pamphlets, or other written materials from your doctor or pharmacy?: 1 - Never  Interpreter Needed?: No  Information entered by :: [facility], LPN  Past Medical History:  Diagnosis Date  . Arthritis   . Asthma   . Back pain   . Breast cancer ([place]) 2009   left breast   . CAD (coronary artery disease)    a. 1994 s/p CABG x 1 (LIMA->LAD); b. 03/2015 MV: No ischemia; c. MV 11/18: small fixed apical defect likely secondary to breast attenuation, EF of 42%, frequent PVCs; d. 12/2017 Cath: LM nl, LAD 20p, D1/2/3 nl, LCX min irregs, OM1/2/3 min irregs, RCA nl, RPDA nl, RPL1/2 nl, LIMA->LAD atretic.  Marland Kitchen Chronic combined systolic (congestive) and diastolic (congestive) heart failure ([place])    a. 2013 EF 40%;  b. 03/2015 Echo: EF 55-60%; c. 12/18 Echo: EF of 35-40%; d. 12/2017 TEE: EF 35-40%; e. 03/2018 Echo: EF 40-45%; f. 09/2018 Echo: EF 35%.  . Dental crowns present    caps- left back top, right back bottom  . Gastroesophageal reflux disease   . Hyperlipidemia   . Hypertension   . MI (myocardial infarction) ([place]) 1994  . Mixed Ischemic & Nonischemic cardiomyopathy    a. 2013 EF 40%;  b. 03/2015 Echo: EF  55-60%; c. 12/18 Echo: EF of 35-40%, hypokinesis of the anteroseptal, and apical myocardium, mild to mod MR, mod dil LA, nl RV fxn, PASP 53 mmHg; d. 12/2017 TEE: EF 35-40%, diff HK, mild to mod MR. small PFO. No LAA/RAA thrombus; e. 03/2018 Echo: EF 40-45%, antsept/inf HK, Gr2 DD, mild MR, mod idl LA, mild to mod TR, PASP 35-70mmHg; f. 09/2018 Echo: EF 35%.  . Persistent atrial fibrillation ([place])    a. diagnosed 01/13/2018; b. CHADS2VASc = 6 --> Eliquis; c. 12/2017 s/p TEE/DCCV. Amio started but d/c'd  01/2018 2/2 n/anorexia; d. 10/2018 DCCV-->recurrent Afib w/in days; e. 10/2018 DCCV x 2 in ED->persistent Afib.  . Personal history of radiation therapy   . Psoriasis   . PSVT (paroxysmal supraventricular tachycardia) ([place])    a. 02/2015 Holter: short runs of SVT and NSVT.  Marland Kitchen Pulmonary hypertension ([place])   . PVC's (premature ventricular contractions)    a. 03/2018 24h Holter: Freq PVC's with a total of 421 beats in 24 hrs. 7 short runs of SVT likely representing Afib.  . Rheumatoid arthritis ([facility])    feet, hands  . Vertigo    approx 2x/yr   Past Surgical History:  Procedure Laterality Date  . ABDOMINAL HYSTERECTOMY    . back fusion    . BREAST BIOPSY Right    1991 negative  . BREAST EXCISIONAL BIOPSY Left    2009 positive  . BREAST LUMPECTOMY Left 2009  . [place] N/A 01/15/2018   Procedure: CARDIOVERSION;  Surgeon: Wellington Hampshire, MD;  Location: [facility];  Service: Cardiovascular;  Laterality: N/A;  . CARDIOVERSION N/A 10/29/2018   Procedure: CARDIOVERSION;  Surgeon: Minna Merritts, MD;  Location: [facility];  Service: Cardiovascular;  Laterality: N/A;  . CHOLECYSTECTOMY    . CORONARY ARTERY BYPASS GRAFT  1994   1 vessel - [facility]  . FOOT ARTHRODESIS Right 07/24/2016   Procedure: FUSION FIRST METATARSAL CUNEIFORM JOINT RIGHT FOOT, FUSION SECOND METATARSAL CUNEFORM JOINT BUNION REPAIR RIGHT FOOT;  Surgeon: Albertine Patricia, DPM;  Location: [facility];  Service: Podiatry;  Laterality: Right;  . FOOT FRACTURE SURGERY    . HARDWARE REMOVAL Right 07/24/2016   Procedure: REMOVAL HARDWARE LATERAL MALLEOUS RIGHT ANKLE;  Surgeon: Albertine Patricia, DPM;  Location: [facility];  Service: Podiatry;  Laterality: Right;  REMOVAL OF PIN WHICH WAS INTACT  . HERNIA REPAIR    . NECK SURGERY    . RIGHT/LEFT HEART CATH AND CORONARY/GRAFT ANGIOGRAPHY N/A 01/14/2018   Procedure: LEFT HEART CATH AND CORONARY  ANGIOGRAPHY;  Surgeon: Wellington Hampshire, MD;  Location: [facility];  Service: Cardiovascular;  Laterality: N/A;  . TEE WITHOUT CARDIOVERSION N/A 01/15/2018   Procedure: TRANSESOPHAGEAL ECHOCARDIOGRAM (TEE);  Surgeon: Wellington Hampshire, MD;  Location: [facility];  Service: Cardiovascular;  Laterality: N/A;  . TOTAL HIP ARTHROPLASTY Left   . TOTAL KNEE ARTHROPLASTY Left    Family History  Problem Relation Age of Onset  . Heart attack Father   . Congestive Heart Failure Mother   . Breast cancer Mother 56  . Kidney failure Sister   . Prostate cancer Brother   . Bladder Cancer Brother   . Lung cancer Sister   . Arthritis Sister   . Breast cancer Sister   . Arthritis Sister   . Breast cancer Sister   . Lung cancer Sister   . Lung cancer Brother   .  Arthritis Brother    Social History   Socioeconomic History  . Marital status: Divorced    Spouse name: Not on file  . Number of children: 2  . Years of education: Not on file  . Highest education level: 11th grade  Occupational History  . Occupation: retired  Tobacco Use  . Smoking status: Former Smoker    Packs/day: 0.50    Years: 35.00    Pack years: 17.50    Types: Cigarettes  . Smokeless tobacco: Never Used  . Tobacco comment: Quit approx 1990  Substance and Sexual Activity  . Alcohol use: No  . Drug use: No  . Sexual activity: Not on file  Other Topics Concern  . Not on file  Social History Narrative  . Not on file   Social Determinants of Health   Financial Resource Strain: Low Risk   . Difficulty of Paying Living Expenses: Not hard at all  Food Insecurity: No Food Insecurity  . Worried About Charity fundraiser in the Last Year: Never true  . Ran Out of Food in the Last Year: Never true  Transportation Needs: No Transportation Needs  . Lack of Transportation (Medical): No  . Lack of Transportation (Non-Medical): No  Physical Activity: Inactive  . Days of Exercise per Week: 0 days  . Minutes of  Exercise per Session: 0 min  Stress: No Stress Concern Present  . Feeling of Stress : Not at all  Social Connections: Moderately Isolated  . Frequency of Communication with Friends and Family: More than three times a week  . Frequency of Social Gatherings with Friends and Family: Never  . Attends Religious Services: Never  . Active Member of Clubs or Organizations: No  . Attends Archivist Meetings: Never  . Marital Status: Divorced    Outpatient Encounter Medications as of 01/10/2020  Medication Sig  . acetaminophen (TYLENOL) 650 MG CR tablet SMARTSIG:1 Tablet(s) By Mouth Every 12 Hours PRN  . apixaban (ELIQUIS) 5 MG TABS tablet Take 1 tablet (5 mg total) by mouth 2 (two) times daily.  Marland Kitchen Apremilast (OTEZLA) 30 MG TABS Take 30 mg by mouth 2 (two) times daily.  . budesonide-formoterol (SYMBICORT) 160-4.5 MCG/ACT inhaler Inhale 2 puffs into the lungs 2 (two) times daily. (Patient taking differently: Inhale 2 puffs into the lungs 2 (two) times daily. As needed)  . Cholecalciferol 25 MCG (1000 UT) capsule Take 1,000 Units by mouth daily.   . diclofenac Sodium (VOLTAREN) 1 % GEL Apply 2 g topically 4 (four) times daily. As needed  . fluticasone (FLONASE) 50 MCG/ACT nasal spray Place 2 sprays into both nostrils daily as needed for allergies.   . furosemide (LASIX) 20 MG tablet TAKE 1 TABLET (20 MG TOTAL) BY MOUTH DAILY AS NEEDED (SHORTNESS OF BREATH).  Marland Kitchen gabapentin (NEURONTIN) 300 MG capsule Take 600 mg by mouth 3 (three) times daily.   . methocarbamol (ROBAXIN) 750 MG tablet Take 750 mg by mouth 2 (two) times daily.   . metoprolol succinate (TOPROL-XL) 50 MG 24 hr tablet TAKE 1 TABLET (50 MG TOTAL) BY MOUTH 2 (TWO) TIMES DAILY  . montelukast (SINGULAIR) 10 MG tablet Take 10 mg by mouth daily as needed (for allergies).   . Multiple Vitamins-Minerals (MULTIVITAMIN PO) Take 1 tablet by mouth daily.  Vladimir Faster Glycol-Propyl Glycol (SYSTANE OP) Place 1 drop into both eyes daily as needed  (dry eyes).  . rosuvastatin (CRESTOR) 5 MG tablet Take 1 tablet (5 mg total) by mouth daily.  Marland Kitchen  Sacubitril-Valsartan (ENTRESTO PO) Take by mouth 2 (two) times daily. Unsure dose  . sennosides-docusate sodium (SENOKOT-S) 8.6-50 MG tablet Take 1 tablet by mouth 2 (two) times daily as needed for constipation.   . traMADol (ULTRAM) 50 MG tablet ONE TAB AS NEEDED FOR JOINT PAIN  . venlafaxine (EFFEXOR) 75 MG tablet TAKE 2 TABLETS (150 MG TOTAL) BY MOUTH DAILY.  Marland Kitchen amoxicillin-clavulanate (AUGMENTIN) 875-125 MG tablet Take 1 tablet by mouth 2 (two) times daily.  . benzonatate (TESSALON) 200 MG capsule Take 1 capsule (200 mg total) by mouth 3 (three) times daily as needed for cough.  . diltiazem (TIAZAC) 120 MG 24 hr capsule Take 120 mg by mouth daily.  . metoprolol succinate (TOPROL-XL) 100 MG 24 hr tablet 150 mg in the am and 100 mg in the pm  . predniSONE (STERAPRED UNI-PAK 21 TAB) 10 MG (21) TBPK tablet Taper as directed (Patient not taking: Reported on 01/10/2020)   No facility-administered encounter medications on file as of 01/10/2020.    Activities of Daily Living In your present state of health, do you have any difficulty performing the following activities: 01/10/2020  Hearing? N  Vision? N  Difficulty concentrating or making decisions? Y  Walking or climbing stairs? N  Dressing or bathing? N  Doing errands, shopping? N  Preparing Food and eating ? N  Using the Toilet? N  In the past six months, have you accidently leaked urine? Y  Comment Often, wearing protection at all times.  Do you have problems with loss of bowel control? N  Managing your Medications? N  Managing your Finances? N  Housekeeping or managing your Housekeeping? N  Some recent data might be hidden    Patient Care Team: Jerrol Banana., MD as PCP - General (Family Medicine) Wellington Hampshire, MD as PCP - Cardiology (Cardiology) Dasher, Rayvon Char, MD as Consulting Physician (Dermatology) Blanche East, MD as Consulting Physician (Neurosurgery) Estevan Ryder, MD as Referring Physician (Cardiology) Troxler, Adele Schilder as Attending Physician (Podiatry) Marlowe Sax, MD as Referring Physician (Internal Medicine)    Assessment:   This is a routine wellness examination for Teresa [name].  Exercise Activities and Dietary recommendations Current Exercise Habits: The patient does not participate in regular exercise at present, Exercise limited by: None identified  Goals    . Exercise 3x per week (30 min per time)     Recommend to exercise 3 times a week for at least 30 minutes at a time.     . Increase water intake     Recommend to increase fluid intake from 2 - 16 oz water bottles every day to 4 - 16 oz water bottles every day  01/05/19- Recommend to drink at least 6-8 8oz glasses of water per day.       Fall Risk: Fall Risk  01/10/2020 01/05/2019 09/16/2017 10/06/2016 10/04/2015  Falls in the past year? 0 0 No No No  Number falls in past yr: 0 - - - -  Injury with Fall? 0 - - - -    FALL RISK PREVENTION PERTAINING TO THE HOME:  Any stairs in or around the home? Yes  If so, are there any without handrails? No   Home free of loose throw rugs in walkways, pet beds, electrical cords, etc? Yes  Adequate lighting in your home to reduce risk of falls? Yes   ASSISTIVE DEVICES UTILIZED TO PREVENT FALLS:  Life alert? No  Use of a cane, walker or w/c? No  Grab bars in the bathroom? Yes  Shower chair or bench in shower? No  Elevated toilet seat or a handicapped toilet? Yes    TIMED UP AND GO:  Was the test performed? No .    Depression Screen PHQ 2/9 Scores 01/10/2020 01/10/2020 01/05/2019 01/05/2019  PHQ - 2 Score 0 0 0 0  PHQ- 9 Score - - 4 -     Cognitive Function     6CIT Screen 01/10/2020 01/05/2019 09/16/2017  What Year? 0 points 0 points 0 points  What month? 0 points 0 points 0 points  What time? 0 points 0 points 0 points  Count back from 20 0 points 0  points 0 points  Months in reverse 0 points 0 points 0 points  Repeat phrase 0 points 0 points 0 points  Total Score 0 0 0    Immunization History  Administered Date(s) Administered  . Influenza Whole 08/22/2018  . Influenza, High Dose Seasonal PF 10/04/2015, 10/06/2016, 09/16/2017, 08/31/2018, 10/07/2019  . Influenza-Unspecified 07/22/2014  . Pneumococcal Conjugate-13 10/06/2016    Qualifies for Shingles Vaccine? Yes . Due for Shingrix. Pt has been advised to call insurance company to determine out of pocket expense. Advised may also receive vaccine at local pharmacy or Health Dept. Verbalized acceptance and understanding.  Tdap: Although this vaccine is not a covered service during a Wellness Exam, does the patient still wish to receive this vaccine today?  No . Advised may receive this vaccine at local pharmacy or Health Dept. Aware to provide a copy of the vaccination record if obtained from local pharmacy or Health Dept. Verbalized acceptance and understanding.  Flu Vaccine: Up to date  Pneumococcal Vaccine: Completed series  Screening Tests Health Maintenance  Topic Date Due  . TETANUS/TDAP  01/08/2021 (Originally 08/09/2018)  . DEXA SCAN  09/30/2022  . INFLUENZA VACCINE  Completed  . PNA vac Low Risk Adult  Completed    Cancer Screenings:  Colorectal Screening: No longer required.   Mammogram: No longer required.   Bone Density: Completed 09/30/17. Results reflect OSTEOPENIA. Repeat every 5 years.   Lung Cancer Screening: (Low Dose CT Chest recommended if Age 6-80 years, 30 pack-year currently smoking OR have quit w/in 15years.) does not qualify.   Additional Screening:  Vision Screening: Recommended annual ophthalmology exams for early detection of glaucoma and other disorders of the eye.  Dental Screening: Recommended annual dental exams for proper oral hygiene  Community Resource Referral:  CRR required this visit?  No       Plan:  I have personally  reviewed and addressed the Medicare Annual Wellness questionnaire and have noted the following in the patient's chart:  A. Medical and social history B. Use of alcohol, tobacco or illicit drugs  C. Current medications and supplements D. Functional ability and status E.  Nutritional status F.  Physical activity G. Advance directives H. List of other physicians I.  Hospitalizations, surgeries, and ER visits in previous 12 months [place] such as hearing and vision if needed, cognitive and depression L. Referrals and appointments   In addition, I have reviewed and discussed with patient certain preventive protocols, quality metrics, and best practice recommendations. A written personalized care plan for preventive services as well as general preventive health recommendations were provided to patient. Nurse Health Advisor  Signed,    Chakia Counts [place], [place]  [id number] Nurse Health Advisor   Nurse Notes: None.

## 2020-01-10 ENCOUNTER — Other Ambulatory Visit: Payer: Self-pay

## 2020-01-10 ENCOUNTER — Ambulatory Visit (INDEPENDENT_AMBULATORY_CARE_PROVIDER_SITE_OTHER): Payer: Medicare HMO

## 2020-01-10 DIAGNOSIS — Z0000 Encounter for general adult medical examination without abnormal findings: Secondary | ICD-10-CM | POA: Diagnosis not present

## 2020-01-10 NOTE — Patient Instructions (Signed)
Teresa [name] , Thank you for taking time to come for your Medicare Wellness Visit. I appreciate your ongoing commitment to your health goals. Please review the following plan we discussed and let me know if I can assist you in the future.   Screening recommendations/referrals: Colonoscopy: No longer required.  Mammogram: No longer required.  Bone Density: Up to date, due 09/2022 Recommended yearly ophthalmology/optometry visit for glaucoma screening and checkup Recommended yearly dental visit for hygiene and checkup  Vaccinations: Influenza vaccine: Up to date Pneumococcal vaccine: Completed series Tdap vaccine: Pt declines today.  Shingles vaccine: Pt declines today.     Advanced directives: Currently on file.   Conditions/risks identified: Recommend to start exercising 3 days a week for at least 30 minutes at a time.   Next appointment: None. Pt to call this Spring to set up a follow up apt with PCP. Declined scheduling an AWV for 2022 at this time.    Preventive Care 56 Years and Older, Female Preventive care refers to lifestyle choices and visits with your health care provider that can promote health and wellness. What does preventive care include?  A yearly physical exam. This is also called an annual well check.  Dental exams once or twice a year.  Routine eye exams. Ask your health care provider how often you should have your eyes checked.  Personal lifestyle choices, including:  Daily care of your teeth and gums.  Regular physical activity.  Eating a healthy diet.  Avoiding tobacco and drug use.  Limiting alcohol use.  Practicing safe sex.  Taking low-dose aspirin every day.  Taking vitamin and mineral supplements as recommended by your health care provider. What happens during an annual well check? The services and screenings done by your health care provider during your annual well check will depend on your age, overall health, lifestyle risk factors, and  family history of disease. Counseling  Your health care provider may ask you questions about your:  Alcohol use.  Tobacco use.  Drug use.  Emotional well-being.  Home and relationship well-being.  Sexual activity.  Eating habits.  History of falls.  Memory and ability to understand (cognition).  Work and work Statistician.  Reproductive health. Screening  You may have the following tests or measurements:  Height, weight, and BMI.  Blood pressure.  Lipid and cholesterol levels. These may be checked every 5 years, or more frequently if you are over 49 years old.  Skin check.  Lung cancer screening. You may have this screening every year starting at age 54 if you have a 30-pack-year history of smoking and currently smoke or have quit within the past 15 years.  Fecal occult blood test (FOBT) of the stool. You may have this test every year starting at age 6.  Flexible sigmoidoscopy or colonoscopy. You may have a sigmoidoscopy every 5 years or a colonoscopy every 10 years starting at age 57.  Hepatitis C blood test.  Hepatitis B blood test.  Sexually transmitted disease (STD) testing.  Diabetes screening. This is done by checking your blood sugar (glucose) after you have not eaten for a while (fasting). You may have this done every 1-3 years.  Bone density scan. This is done to screen for osteoporosis. You may have this done starting at age 34.  Mammogram. This may be done every 1-2 years. Talk to your health care provider about how often you should have regular mammograms. Talk with your health care provider about your test results, treatment options, and  if necessary, the need for more tests. Vaccines  Your health care provider may recommend certain vaccines, such as:  Influenza vaccine. This is recommended every year.  Tetanus, diphtheria, and acellular pertussis (Tdap, Td) vaccine. You may need a Td booster every 10 years.  Zoster vaccine. You may need this  after age 86.  Pneumococcal 13-valent conjugate (PCV13) vaccine. One dose is recommended after age 90.  Pneumococcal polysaccharide (PPSV23) vaccine. One dose is recommended after age 65. Talk to your health care provider about which screenings and vaccines you need and how often you need them. This information is not intended to replace advice given to you by your health care provider. Make sure you discuss any questions you have with your health care provider. Document Released: 01/04/2016 Document Revised: 08/27/2016 Document Reviewed: 10/09/2015 Elsevier Interactive Patient Education  2017 [place] Prevention in the Home Falls can cause injuries. They can happen to people of all ages. There are many things you can do to make your home safe and to help prevent falls. What can I do on the outside of my home?  Regularly fix the edges of walkways and driveways and fix any cracks.  Remove anything that might make you trip as you walk through a door, such as a raised step or threshold.  Trim any bushes or trees on the path to your home.  Use bright outdoor lighting.  Clear any walking paths of anything that might make someone trip, such as rocks or tools.  Regularly check to see if handrails are loose or broken. Make sure that both sides of any steps have handrails.  Any raised decks and porches should have guardrails on the edges.  Have any leaves, snow, or ice cleared regularly.  Use sand or salt on walking paths during winter.  Clean up any spills in your garage right away. This includes oil or grease spills. What can I do in the bathroom?  Use night lights.  Install grab bars by the toilet and in the tub and shower. Do not use towel bars as grab bars.  Use non-skid mats or decals in the tub or shower.  If you need to sit down in the shower, use a plastic, non-slip stool.  Keep the floor dry. Clean up any water that spills on the floor as soon as it  happens.  Remove soap buildup in the tub or shower regularly.  Attach bath mats securely with double-sided non-slip rug tape.  Do not have throw rugs and other things on the floor that can make you trip. What can I do in the bedroom?  Use night lights.  Make sure that you have a light by your bed that is easy to reach.  Do not use any sheets or blankets that are too big for your bed. They should not hang down onto the floor.  Have a firm chair that has side arms. You can use this for support while you get dressed.  Do not have throw rugs and other things on the floor that can make you trip. What can I do in the kitchen?  Clean up any spills right away.  Avoid walking on wet floors.  Keep items that you use a lot in easy-to-reach places.  If you need to reach something above you, use a strong step stool that has a grab bar.  Keep electrical cords out of the way.  Do not use floor polish or wax that makes floors slippery. If you  must use wax, use non-skid floor wax.  Do not have throw rugs and other things on the floor that can make you trip. What can I do with my stairs?  Do not leave any items on the stairs.  Make sure that there are handrails on both sides of the stairs and use them. Fix handrails that are broken or loose. Make sure that handrails are as long as the stairways.  Check any carpeting to make sure that it is firmly attached to the stairs. Fix any carpet that is loose or worn.  Avoid having throw rugs at the top or bottom of the stairs. If you do have throw rugs, attach them to the floor with carpet tape.  Make sure that you have a light switch at the top of the stairs and the bottom of the stairs. If you do not have them, ask someone to add them for you. What else can I do to help prevent falls?  Wear shoes that:  Do not have high heels.  Have rubber bottoms.  Are comfortable and fit you well.  Are closed at the toe. Do not wear sandals.  If you  use a stepladder:  Make sure that it is fully opened. Do not climb a closed stepladder.  Make sure that both sides of the stepladder are locked into place.  Ask someone to hold it for you, if possible.  Clearly mark and make sure that you can see:  Any grab bars or handrails.  First and last steps.  Where the edge of each step is.  Use tools that help you move around (mobility aids) if they are needed. These include:  Canes.  Walkers.  Scooters.  Crutches.  Turn on the lights when you go into a dark area. Replace any light bulbs as soon as they burn out.  Set up your furniture so you have a clear path. Avoid moving your furniture around.  If any of your floors are uneven, fix them.  If there are any pets around you, be aware of where they are.  Review your medicines with your doctor. Some medicines can make you feel dizzy. This can increase your chance of falling. Ask your doctor what other things that you can do to help prevent falls. This information is not intended to replace advice given to you by your health care provider. Make sure you discuss any questions you have with your health care provider. Document Released: 10/04/2009 Document Revised: 05/15/2016 Document Reviewed: 01/12/2015 Elsevier Interactive Patient Education  2017 [employer].

## 2020-01-27 DIAGNOSIS — I5022 Chronic systolic (congestive) heart failure: Secondary | ICD-10-CM | POA: Diagnosis not present

## 2020-02-02 DIAGNOSIS — Z23 Encounter for immunization: Secondary | ICD-10-CM | POA: Diagnosis not present

## 2020-02-29 DIAGNOSIS — C4442 Squamous cell carcinoma of skin of scalp and neck: Secondary | ICD-10-CM | POA: Diagnosis not present

## 2020-03-01 DIAGNOSIS — Z23 Encounter for immunization: Secondary | ICD-10-CM | POA: Diagnosis not present

## 2020-04-26 NOTE — Progress Notes (Signed)
Established patient visit   Patient: Teresa [name]   DOB: 03-09-1941   79 y.o. Female  MRN: [id number] Visit Date: 05/02/2020  I,Sulibeya S Dimas,acting as a scribe for [name], MD.,have documented all relevant documentation on the behalf of [name], MD,as directed by  [name], MD while in the presence of [name], MD.  Today's healthcare provider: [name], MD   Chief Complaint  Patient presents with  . Foot Problem   Subjective    HPI  Patient here today with her daughter Teresa [name]. Patient's cardiac status is stable and she feels well from that.  No chest pain no shortness of breath.  She has no back pain and no leg pain. Patient her today C/O persistent numbness in feet x several months. Patient reports pain is about the same since she first noticed. Patient has been taking gabapentin 600 mg BID, reports mild pain control. Patient reports numbness is about the same day and night. Patient reports burning sensation also.  She also states her feet frequently feel cold.  Patient Active Problem List   Diagnosis Date Noted  . Neuropathy 05/02/2020  . Chronic obstructive pulmonary disease ([place]) 03/11/2019  . Atrial fibrillation ([place]) 11/11/2018  . A-fib ([place]) 10/12/2018  . Rheumatoid arthritis ([place]) 10/10/2018  . Atrial fibrillation with RVR ([place]) 10/10/2018  . Psoriatic arthritis ([place]) 03/04/2018  . Unstable angina ([place])   . Atrial fibrillation with rapid ventricular response ([place]) 01/13/2018  . 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 Asthma 05/09/2017  . 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 Arthralgia of both hands 01/31/2016  . Ventricular premature depolarization 09/11/2015  . Psoriasis 07/16/2015  . 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 Hyperlipidemia 03/29/2015  . Pre-syncope 03/04/2015  . CAD (coronary artery disease)   . PVC's (premature ventricular contractions)    . Ischemic cardiomyopathy   . DDD (degenerative disc disease), lumbar 01/18/2015  . Intervertebral disc disorder with radiculopathy of lumbar region 12/26/2014  . L-S radiculopathy 07/03/2014  . Breast CA ([place]) 06/28/2014  . Arthritis, degenerative 06/28/2014  . Acid reflux 03/08/2014  . 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 Smoking status: Former Smoker    Packs/day: 0.50    Years: 35.00    Pack years: 17.50    Types: Cigarettes  . Smokeless tobacco: Never Used  . Tobacco comment: Quit approx 1990  Substance Use Topics  . Alcohol use: No  . Drug use: No       Medications: Outpatient Medications Prior to Visit  Medication Sig  . acetaminophen (TYLENOL) 650 MG CR tablet SMARTSIG:1 Tablet(s) By Mouth Every 12 Hours PRN  . apixaban (ELIQUIS) 5 MG TABS tablet Take 1 tablet (5 mg total) by mouth 2 (two) times daily.  . budesonide-formoterol (SYMBICORT) 160-4.5 MCG/ACT inhaler Inhale 2 puffs into the lungs 2 (two) times daily. (Patient taking differently: Inhale 2 puffs into the lungs 2 (two) times daily. As needed)  . Cholecalciferol 25 MCG (1000 UT) capsule Take 1,000 Units by mouth daily.   Marland Kitchen ENTRESTO 24-26 MG Take 1 tablet by mouth 2 (two) times daily.  . fluticasone (FLONASE) 50 MCG/ACT nasal spray Place 2 sprays into both nostrils daily as needed for allergies.   . furosemide (LASIX) 20 MG tablet TAKE 1 TABLET (20 MG TOTAL) BY MOUTH DAILY AS NEEDED (SHORTNESS OF BREATH).  Marland Kitchen metoprolol succinate (TOPROL-XL)  50 MG 24 hr tablet TAKE 1 TABLET (50 MG TOTAL) BY MOUTH 2 (TWO) TIMES DAILY  . montelukast (SINGULAIR) 10 MG tablet Take 10 mg by mouth daily as needed (for allergies).   . Multiple Vitamins-Minerals (MULTIVITAMIN PO) Take 1 tablet by mouth daily.  . rosuvastatin (CRESTOR) 5 MG tablet Take 1 tablet (5 mg total) by mouth daily.  . traMADol (ULTRAM) 50 MG tablet ONE TAB AS NEEDED FOR JOINT PAIN  . venlafaxine (EFFEXOR) 75 MG  tablet TAKE 2 TABLETS (150 MG TOTAL) BY MOUTH DAILY.  . [DISCONTINUED] gabapentin (NEURONTIN) 300 MG capsule Take 600 mg by mouth 3 (three) times daily.   . [DISCONTINUED] amoxicillin-clavulanate (AUGMENTIN) 875-125 MG tablet Take 1 tablet by mouth 2 (two) times daily.  . [DISCONTINUED] Apremilast (OTEZLA) 30 MG TABS Take 30 mg by mouth 2 (two) times daily.  . [DISCONTINUED] benzonatate (TESSALON) 200 MG capsule Take 1 capsule (200 mg total) by mouth 3 (three) times daily as needed for cough.  . [DISCONTINUED] diclofenac Sodium (VOLTAREN) 1 % GEL Apply 2 g topically 4 (four) times daily. As needed  . [DISCONTINUED] diltiazem (TIAZAC) 120 MG 24 hr capsule Take 120 mg by mouth daily.  . [DISCONTINUED] methocarbamol (ROBAXIN) 750 MG tablet Take 750 mg by mouth 2 (two) times daily.   . [DISCONTINUED] metoprolol succinate (TOPROL-XL) 100 MG 24 hr tablet 150 mg in the am and 100 mg in the pm  . [DISCONTINUED] Polyethyl Glycol-Propyl Glycol (SYSTANE OP) Place 1 drop into both eyes daily as needed (dry eyes).  . [DISCONTINUED] predniSONE (STERAPRED UNI-PAK 21 TAB) 10 MG (21) TBPK tablet Taper as directed (Patient not taking: Reported on 01/10/2020)  . [DISCONTINUED] Sacubitril-Valsartan (ENTRESTO PO) Take by mouth 2 (two) times daily. Unsure dose  . [DISCONTINUED] sennosides-docusate sodium (SENOKOT-S) 8.6-50 MG tablet Take 1 tablet by mouth 2 (two) times daily as needed for constipation.    No facility-administered medications prior to visit.    Review of Systems  Constitutional: Negative for appetite change, chills, fatigue and fever.  Respiratory: Negative for chest tightness and shortness of breath.   Cardiovascular: Negative for chest pain and palpitations.  Gastrointestinal: Negative for abdominal pain, nausea and vomiting.  Endocrine: Negative.   Allergic/Immunologic: Negative.   Neurological: Positive for numbness. Negative for dizziness and weakness.  Hematological: Negative.     Psychiatric/Behavioral: Negative.       Objective    BP 123/73 (BP Location: Left Arm, Patient Position: Sitting, Cuff Size: Normal)   Pulse (!) 53   Temp (!) 96.9 F (36.1 C) (Temporal)   Resp 16   Wt 172 lb 3.2 oz (78.1 kg)   BMI 29.56 kg/m  BP Readings from Last 3 Encounters:  05/02/20 123/73  07/25/19 126/72  03/11/19 120/78   Wt Readings from Last 3 Encounters:  05/02/20 172 lb 3.2 oz (78.1 kg)  07/25/19 149 lb (67.6 kg)  03/11/19 155 lb (70.3 kg)      Physical Exam Vitals reviewed.  Constitutional:      General: She is not in acute distress.    Appearance: Normal appearance. She is well-developed. She is not diaphoretic.  HENT:     Head: Normocephalic and atraumatic.  Eyes:     General: No scleral icterus.    Conjunctiva/sclera: Conjunctivae normal.  Neck:     Thyroid: No thyromegaly.  Cardiovascular:     Rate and Rhythm: Normal rate and regular rhythm.     Pulses: Normal pulses.     Heart sounds: Normal  heart sounds. No murmur.  Pulmonary:     Effort: Pulmonary effort is normal. No respiratory distress.     Breath sounds: Normal breath sounds. No wheezing, rhonchi or rales.  Musculoskeletal:     Cervical back: Neck supple.     Right lower leg: No edema.     Left lower leg: No edema.  Lymphadenopathy:     Cervical: No cervical adenopathy.  Skin:    General: Skin is warm and dry.     Findings: No rash.  Neurological:     Mental Status: She is alert and oriented to person, place, and time. Mental status is at baseline.  Psychiatric:        Mood and Affect: Mood normal.        Behavior: Behavior normal.        Thought Content: Thought content normal.        Judgment: Judgment normal.      No results found for any visits on 05/02/20.  Assessment & Plan     Problem List Items Addressed This Visit      Nervous and Auditory   Neuropathy - Primary    Worsening Will increase Gabapentin to 600mg  TID Follow up pending labs      Relevant  Medications   gabapentin (NEURONTIN) 300 MG capsule   Other Relevant Orders   Vitamin B12   CBC with Differential/Platelet   TSH   Hemoglobin A1c    Other Visit Diagnoses    Other fatigue       Relevant Orders   Vitamin B12   CBC with Differential/Platelet   TSH   Hemoglobin A1c    Consider TCA or pregabalin if this does not help.  I really do think the gabapentin and increased dose will help.  Max for this 79 year old will be 2400 mg a day More than 50% 25 minute visit spent in counseling or coordination of care CAD/status post CABG All risk factors treated Persistent atrial fibrillation On Eliquis.  Return in about 4 weeks (around 05/30/2020).      I, [name], MD, have reviewed all documentation for this visit. The documentation on 05/03/20 for the exam, diagnosis, procedures, and orders are all accurate and complete.    Hilman Kissling Cranford Mon, MD  [facility] [phone] (phone) [phone] (fax)  [place]

## 2020-05-02 ENCOUNTER — Ambulatory Visit (INDEPENDENT_AMBULATORY_CARE_PROVIDER_SITE_OTHER): Payer: Medicare HMO | Admitting: Family Medicine

## 2020-05-02 ENCOUNTER — Other Ambulatory Visit: Payer: Self-pay

## 2020-05-02 ENCOUNTER — Encounter: Payer: Self-pay | Admitting: Family Medicine

## 2020-05-02 VITALS — BP 123/73 | HR 53 | Temp 96.9°F | Resp 16 | Wt 172.2 lb

## 2020-05-02 DIAGNOSIS — Z951 Presence of aortocoronary bypass graft: Secondary | ICD-10-CM

## 2020-05-02 DIAGNOSIS — G629 Polyneuropathy, unspecified: Secondary | ICD-10-CM

## 2020-05-02 DIAGNOSIS — I4819 Other persistent atrial fibrillation: Secondary | ICD-10-CM | POA: Diagnosis not present

## 2020-05-02 DIAGNOSIS — R5383 Other fatigue: Secondary | ICD-10-CM

## 2020-05-02 MED ORDER — GABAPENTIN 300 MG PO CAPS: 600.0000 mg | ORAL_CAPSULE | Freq: Three times a day (TID) | ORAL | 2 refills | Status: DC

## 2020-05-02 NOTE — Assessment & Plan Note (Signed)
Worsening Will increase Gabapentin to 600mg  TID Follow up pending labs

## 2020-05-11 DIAGNOSIS — M545 Low back pain: Secondary | ICD-10-CM | POA: Diagnosis not present

## 2020-05-11 DIAGNOSIS — I480 Paroxysmal atrial fibrillation: Secondary | ICD-10-CM | POA: Diagnosis not present

## 2020-05-17 ENCOUNTER — Other Ambulatory Visit: Payer: Self-pay | Admitting: Vascular Neurology

## 2020-05-17 DIAGNOSIS — M545 Low back pain, unspecified: Secondary | ICD-10-CM

## 2020-05-22 ENCOUNTER — Telehealth: Payer: Self-pay | Admitting: Vascular Neurology

## 2020-05-22 NOTE — Telephone Encounter (Signed)
05/22/20~ LM on VM. MF

## 2020-05-30 DIAGNOSIS — R5383 Other fatigue: Secondary | ICD-10-CM | POA: Diagnosis not present

## 2020-05-30 DIAGNOSIS — G629 Polyneuropathy, unspecified: Secondary | ICD-10-CM | POA: Diagnosis not present

## 2020-05-31 ENCOUNTER — Telehealth: Payer: Self-pay

## 2020-05-31 LAB — CBC WITH DIFFERENTIAL/PLATELET
Basophils Absolute: 0.1 10*3/uL (ref 0.0–0.2)
Basos: 1 %
EOS (ABSOLUTE): 0.3 10*3/uL (ref 0.0–0.4)
Eos: 6 %
Hematocrit: 43 % (ref 34.0–46.6)
Hemoglobin: 14 g/dL (ref 11.1–15.9)
Immature Grans (Abs): 0 10*3/uL (ref 0.0–0.1)
Immature Granulocytes: 0 %
Lymphocytes Absolute: 2.1 10*3/uL (ref 0.7–3.1)
Lymphs: 38 %
MCH: 30.6 pg (ref 26.6–33.0)
MCHC: 32.6 g/dL (ref 31.5–35.7)
MCV: 94 fL (ref 79–97)
Monocytes Absolute: 0.5 10*3/uL (ref 0.1–0.9)
Monocytes: 10 %
Neutrophils Absolute: 2.5 10*3/uL (ref 1.4–7.0)
Neutrophils: 45 %
Platelets: 230 10*3/uL (ref 150–450)
RBC: 4.58 x10E6/uL (ref 3.77–5.28)
RDW: 13.1 % (ref 11.7–15.4)
WBC: 5.5 10*3/uL (ref 3.4–10.8)

## 2020-05-31 LAB — TSH: TSH: 2.13 u[IU]/mL (ref 0.450–4.500)

## 2020-05-31 LAB — HEMOGLOBIN A1C
Est. average glucose Bld gHb Est-mCnc: 120 mg/dL
Hgb A1c MFr Bld: 5.8 % — ABNORMAL HIGH (ref 4.8–5.6)

## 2020-05-31 LAB — VITAMIN B12: Vitamin B-12: 875 pg/mL (ref 232–1245)

## 2020-05-31 NOTE — Telephone Encounter (Signed)
Patient advised.

## 2020-05-31 NOTE — Telephone Encounter (Signed)
-----   Message from Jerrol Banana., MD sent at 05/31/2020 10:16 AM EDT ----- Labs stable.

## 2020-06-05 NOTE — Progress Notes (Signed)
Established patient visit   Patient: Teresa [name]   DOB: Dec 27, 1940   79 y.o. Female  MRN: [id number] Visit Date: 06/06/2020  Today's healthcare provider: [name], MD   No chief complaint on file.  Subjective    HPI  All other problems are stable. Neuropathy From 05/02/2020-increased gabapentin to 600 TID.   Follow up for Neuropathy  The patient was last seen for this 1 months ago. Changes made at last visit include increasing gabapentin to 600mg  three times a day.  She reports excellent compliance with treatment. She feels that condition is Improved some, but not to baseline.  She is having side effects. Pt reports feeling more fatigued on the increased dose.    -----------------------------------------------------------------------------------------    Patient Active Problem List   Diagnosis Date Noted  . Neuropathy 05/02/2020  . Chronic obstructive pulmonary disease ([place]) 03/11/2019  . Atrial fibrillation ([place]) 11/11/2018  . A-fib ([place]) 10/12/2018  . Rheumatoid arthritis ([place]) 10/10/2018  . Atrial fibrillation with RVR ([place]) 10/10/2018  . Psoriatic arthritis ([place]) 03/04/2018  . Unstable angina ([place])   . Atrial fibrillation with rapid ventricular response ([place]) 01/13/2018  . 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 Asthma 05/09/2017  . 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 Arthralgia of both hands 01/31/2016  . Ventricular premature depolarization 09/11/2015  . Psoriasis 07/16/2015  . 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 Hyperlipidemia 03/29/2015  . Pre-syncope 03/04/2015  . CAD (coronary artery disease)   . PVC's (premature ventricular contractions)   . Ischemic cardiomyopathy   . DDD (degenerative disc disease), lumbar 01/18/2015  . Intervertebral disc disorder with radiculopathy of lumbar region 12/26/2014  . L-S radiculopathy 07/03/2014  . Breast CA ([place]) 06/28/2014  . Arthritis,  degenerative 06/28/2014  . Acid reflux 03/08/2014  . 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 Smoking status: Former Smoker    Packs/day: 0.50    Years: 35.00    Pack years: 17.50    Types: Cigarettes  . Smokeless tobacco: Never Used  . Tobacco comment: Quit approx 1990  Vaping Use  . Vaping Use: Never used  Substance Use Topics  . Alcohol use: No  . Drug use: No   Allergies  Allergen Reactions  . Sulfa Antibiotics Hives  . Ivp Dye [Iodinated Diagnostic Agents] Swelling  . Amiodarone Nausea And Vomiting and Cough  . Codeine Other (See Comments)    ALTERED MENTAL STATUS     Medications: Outpatient Medications Prior to Visit  Medication Sig  . acetaminophen (TYLENOL) 650 MG CR tablet SMARTSIG:1 Tablet(s) By Mouth Every 12 Hours PRN  . apixaban (ELIQUIS) 5 MG TABS tablet Take 1 tablet (5 mg total) by mouth 2 (two) times daily.  . budesonide-formoterol (SYMBICORT) 160-4.5 MCG/ACT inhaler Inhale 2 puffs into the lungs 2 (two) times daily. (Patient taking differently: Inhale 2 puffs into the lungs 2 (two) times daily. As needed)  . Cholecalciferol 25 MCG (1000 UT) capsule Take 1,000 Units by mouth daily.   Marland Kitchen ENTRESTO 24-26 MG Take 1 tablet by mouth 2 (two) times daily.  . fluticasone (FLONASE) 50 MCG/ACT nasal spray Place 2 sprays into both nostrils daily as needed for allergies.   . furosemide (LASIX) 20 MG tablet TAKE 1 TABLET (20 MG TOTAL) BY MOUTH DAILY AS NEEDED (SHORTNESS OF BREATH).  Marland Kitchen gabapentin (NEURONTIN) 300 MG capsule Take 2 capsules (600 mg total) by mouth 3 (three)  times daily.  . metoprolol succinate (TOPROL-XL) 50 MG 24 hr tablet TAKE 1 TABLET (50 MG TOTAL) BY MOUTH 2 (TWO) TIMES DAILY  . montelukast (SINGULAIR) 10 MG tablet Take 10 mg by mouth daily as needed (for allergies).   . Multiple Vitamins-Minerals (MULTIVITAMIN PO) Take 1 tablet by mouth daily.  Marland Kitchen OTEZLA 30 MG TABS Take 2 tablets by mouth daily.  .  rosuvastatin (CRESTOR) 5 MG tablet Take 1 tablet (5 mg total) by mouth daily.  . traMADol (ULTRAM) 50 MG tablet ONE TAB AS NEEDED FOR JOINT PAIN  . venlafaxine (EFFEXOR) 75 MG tablet TAKE 2 TABLETS (150 MG TOTAL) BY MOUTH DAILY.   No facility-administered medications prior to visit.    Review of Systems  Constitutional: Positive for fatigue. Negative for appetite change, chills and fever.  Eyes: Negative.   Respiratory: Negative.  Negative for chest tightness and shortness of breath.   Cardiovascular: Negative.  Negative for chest pain and palpitations.  Gastrointestinal: Negative.  Negative for abdominal pain, nausea and vomiting.  Endocrine: Negative.   Musculoskeletal: Positive for back pain, gait problem and neck pain. Negative for arthralgias, joint swelling, myalgias and neck stiffness.  Allergic/Immunologic: Negative.   Neurological: Negative for dizziness, weakness, light-headedness and headaches.  Psychiatric/Behavioral: Negative.       Objective    BP 114/72 (BP Location: Left Arm, Patient Position: Sitting, Cuff Size: Large)   Pulse (!) 56   Temp (!) 96.5 F (35.8 C) (Temporal)   Wt 172 lb (78 kg)   SpO2 97%   BMI 29.52 kg/m    Physical Exam Vitals reviewed.  Constitutional:      General: She is not in acute distress.    Appearance: Normal appearance. She is well-developed. She is not diaphoretic.  HENT:     Head: Normocephalic and atraumatic.  Eyes:     General: No scleral icterus.    Conjunctiva/sclera: Conjunctivae normal.  Neck:     Thyroid: No thyromegaly.  Cardiovascular:     Rate and Rhythm: Normal rate and regular rhythm.     Pulses: Normal pulses.     Heart sounds: Normal heart sounds. No murmur heard.   Pulmonary:     Effort: Pulmonary effort is normal. No respiratory distress.     Breath sounds: Normal breath sounds. No wheezing, rhonchi or rales.  Musculoskeletal:     Cervical back: Neck supple.     Right lower leg: No edema.     Left  lower leg: No edema.  Lymphadenopathy:     Cervical: No cervical adenopathy.  Skin:    General: Skin is warm and dry.     Findings: No rash.  Neurological:     Mental Status: She is alert and oriented to person, place, and time. Mental status is at baseline.  Psychiatric:        Mood and Affect: Mood normal.        Behavior: Behavior normal.        Thought Content: Thought content normal.        Judgment: Judgment normal.       No results found for any visits on 06/06/20.  Assessment & Plan     1. Neuropathy Continue to work on getting a bigger gabapentin dose at bedtime to see if that helps her sleep better at night.  2. Other fatigue I think some of the fatigue is due to interrupted sleep during the night due to pain from neuropathy.  3. Persistent atrial fibrillation ([place])  Followed by cardiology.  Patient on Eliquis. 4.  CAD All risk factors treated  No follow-ups on file.      I, [name], MD, have reviewed all documentation for this visit. The documentation on 06/11/20 for the exam, diagnosis, procedures, and orders are all accurate and complete.    Jarquez Mestre Cranford Mon, MD  [facility] [phone] (phone) [phone] (fax)  [place]

## 2020-06-06 ENCOUNTER — Encounter: Payer: Self-pay | Admitting: Family Medicine

## 2020-06-06 ENCOUNTER — Other Ambulatory Visit: Payer: Self-pay

## 2020-06-06 ENCOUNTER — Ambulatory Visit (INDEPENDENT_AMBULATORY_CARE_PROVIDER_SITE_OTHER): Payer: Medicare HMO | Admitting: Family Medicine

## 2020-06-06 VITALS — BP 114/72 | HR 56 | Temp 96.5°F | Wt 172.0 lb

## 2020-06-06 DIAGNOSIS — G629 Polyneuropathy, unspecified: Secondary | ICD-10-CM

## 2020-06-06 DIAGNOSIS — R5383 Other fatigue: Secondary | ICD-10-CM | POA: Diagnosis not present

## 2020-06-06 DIAGNOSIS — I4819 Other persistent atrial fibrillation: Secondary | ICD-10-CM

## 2020-06-06 MED ORDER — DONEPEZIL HCL 5 MG PO TABS: 5.0000 mg | ORAL_TABLET | Freq: Every day | ORAL | 5 refills | Status: DC

## 2020-06-11 DIAGNOSIS — M5417 Radiculopathy, lumbosacral region: Secondary | ICD-10-CM | POA: Diagnosis not present

## 2020-06-11 DIAGNOSIS — M5136 Other intervertebral disc degeneration, lumbar region: Secondary | ICD-10-CM | POA: Diagnosis not present

## 2020-07-03 DIAGNOSIS — M8949 Other hypertrophic osteoarthropathy, multiple sites: Secondary | ICD-10-CM | POA: Diagnosis not present

## 2020-07-03 DIAGNOSIS — L409 Psoriasis, unspecified: Secondary | ICD-10-CM | POA: Diagnosis not present

## 2020-07-03 DIAGNOSIS — M19042 Primary osteoarthritis, left hand: Secondary | ICD-10-CM | POA: Diagnosis not present

## 2020-07-03 DIAGNOSIS — Z79899 Other long term (current) drug therapy: Secondary | ICD-10-CM | POA: Diagnosis not present

## 2020-07-03 DIAGNOSIS — L4050 Arthropathic psoriasis, unspecified: Secondary | ICD-10-CM | POA: Diagnosis not present

## 2020-07-03 DIAGNOSIS — M19041 Primary osteoarthritis, right hand: Secondary | ICD-10-CM | POA: Diagnosis not present

## 2020-07-06 ENCOUNTER — Telehealth: Payer: Self-pay | Admitting: *Deleted

## 2020-07-11 ENCOUNTER — Other Ambulatory Visit: Payer: Self-pay | Admitting: Family Medicine

## 2020-07-11 DIAGNOSIS — Z1231 Encounter for screening mammogram for malignant neoplasm of breast: Secondary | ICD-10-CM

## 2020-07-13 DIAGNOSIS — M5416 Radiculopathy, lumbar region: Secondary | ICD-10-CM | POA: Diagnosis not present

## 2020-07-13 DIAGNOSIS — M5136 Other intervertebral disc degeneration, lumbar region: Secondary | ICD-10-CM | POA: Diagnosis not present

## 2020-07-13 DIAGNOSIS — M48062 Spinal stenosis, lumbar region with neurogenic claudication: Secondary | ICD-10-CM | POA: Diagnosis not present

## 2020-07-20 NOTE — Chronic Care Management (AMB) (Signed)
  Chronic Care Management   Note  07/20/2020 Name: ILIYAH BUI MRN: [id number] DOB: [birth year]  KAMEA DACOSTA is a 79 y.o. year old female who is a primary care patient of Jerrol Banana., MD. I reached out to Otho Bellows by phone today in response to a referral sent by Ms. Charlynne Cousins Gordin's health plan.     Ms. Grimm was given information about Chronic Care Management services today including:  1. CCM service includes personalized support from designated clinical staff supervised by her physician, including individualized plan of care and coordination with other care providers 2. 24/7 contact phone numbers for assistance for urgent and routine care needs. 3. Service will only be billed when office clinical staff spend 20 minutes or more in a month to coordinate care. 4. Only one practitioner may furnish and bill the service in a calendar month. 5. The patient may stop CCM services at any time (effective at the end of the month) by phone call to the office staff. 6. The patient will be responsible for cost sharing (co-pay) of up to 20% of the service fee (after annual deductible is met).  Patient agreed to services and verbal consent obtained.   Follow up plan: Telephone appointment with care management team member scheduled for: 08/06/2020  [facility]  [place], [place] [address] Direct Dial: [email or web address] Website: [email or web address]

## 2020-07-25 ENCOUNTER — Other Ambulatory Visit: Payer: Self-pay

## 2020-07-25 ENCOUNTER — Ambulatory Visit
Admission: RE | Admit: 2020-07-25 | Discharge: 2020-07-25 | Disposition: A | Discharge status: Home / Self Care | Payer: Medicare HMO | Source: Ambulatory Visit | Attending: Family Medicine | Admitting: Family Medicine

## 2020-07-25 DIAGNOSIS — Z1231 Encounter for screening mammogram for malignant neoplasm of breast: Secondary | ICD-10-CM | POA: Diagnosis not present

## 2020-08-06 ENCOUNTER — Other Ambulatory Visit: Payer: Self-pay

## 2020-08-06 ENCOUNTER — Ambulatory Visit: Payer: Medicare HMO | Admitting: Pharmacist

## 2020-08-06 DIAGNOSIS — L4050 Arthropathic psoriasis, unspecified: Secondary | ICD-10-CM

## 2020-08-06 DIAGNOSIS — J441 Chronic obstructive pulmonary disease with (acute) exacerbation: Secondary | ICD-10-CM

## 2020-08-06 NOTE — Chronic Care Management (AMB) (Signed)
Chronic Care Management Pharmacy  Name: Teresa [name]  MRN: [id number] DOB: 1941/09/03  Chief Complaint/ HPI  Teresa [name],  79 y.o. , female presents for their Initial CCM visit with the clinical pharmacist via telephone due to COVID-19 Pandemic.  PCP : Jerrol Banana., MD  Their chronic conditions include: HF, COPD, HLD  Office Visits: 6/16 neuropathy, BP 114/72 P 56 Wt 172 BMI 29.5  Consult Visit: 7/13 psoriatic arthritis, BP 188/70, Otezal, never tried Enbrel or Humira, also OA, d/c tramadol, 5/12 inc gabapentin 658m tid, try for bigger qhs dose  Medications: Outpatient Encounter Medications as of 08/06/2020  Medication Sig  . acetaminophen (TYLENOL) 650 MG CR tablet SMARTSIG:1 Tablet(s) By Mouth Every 12 Hours PRN  . apixaban (ELIQUIS) 5 MG TABS tablet Take 1 tablet (5 mg total) by mouth 2 (two) times daily.  . budesonide-formoterol (SYMBICORT) 160-4.5 MCG/ACT inhaler Inhale 2 puffs into the lungs 2 (two) times daily. (Patient taking differently: Inhale 2 puffs into the lungs 2 (two) times daily. As needed)  . Cholecalciferol 25 MCG (1000 UT) capsule Take 1,000 Units by mouth daily.   .Marland Kitchendonepezil (ARICEPT) 5 MG tablet Take 1 tablet (5 mg total) by mouth at bedtime.  .Marland KitchenENTRESTO 24-26 MG Take 1 tablet by mouth 2 (two) times daily.  . fluticasone (FLONASE) 50 MCG/ACT nasal spray Place 2 sprays into both nostrils daily as needed for allergies.   . furosemide (LASIX) 20 MG tablet TAKE 1 TABLET (20 MG TOTAL) BY MOUTH DAILY AS NEEDED (SHORTNESS OF BREATH).  .Marland Kitchengabapentin (NEURONTIN) 300 MG capsule Take 2 capsules (600 mg total) by mouth 3 (three) times daily.  . metoprolol succinate (TOPROL-XL) 50 MG 24 hr tablet TAKE 1 TABLET (50 MG TOTAL) BY MOUTH 2 (TWO) TIMES DAILY  . montelukast (SINGULAIR) 10 MG tablet Take 10 mg by mouth daily as needed (for allergies).   . Multiple Vitamins-Minerals (MULTIVITAMIN PO) Take 1 tablet by mouth daily.  .Marland KitchenOTEZLA 30 MG TABS Take 2  tablets by mouth daily.  . rosuvastatin (CRESTOR) 5 MG tablet Take 1 tablet (5 mg total) by mouth daily.  . traMADol (ULTRAM) 50 MG tablet ONE TAB AS NEEDED FOR JOINT PAIN  . venlafaxine (EFFEXOR) 75 MG tablet TAKE 2 TABLETS (150 MG TOTAL) BY MOUTH DAILY.   No facility-administered encounter medications on file as of 08/06/2020.     Social Connections: Socially Isolated  . Frequency of Communication with Friends and Family: More than three times a week  . Frequency of Social Gatherings with Friends and Family: Never  . Attends Religious Services: Never  . Active Member of Clubs or Organizations: No  . Attends CArchivistMeetings: Never  . Marital Status: Divorced    Current Diagnosis/Assessment:  Goals Addressed   None    Hyperlipidemia   LDL goal < 70  Lipid Panel     Component Value Date/Time   CHOL 132 02/16/2019 1530   TRIG 65 02/16/2019 1530   HDL 57 02/16/2019 1530   LDLCALC 62 02/16/2019 1530    Hepatic Function Latest Ref Rng & Units 02/16/2019 10/21/2017 06/23/2017  Total Protein 6.0 - 8.5 g/dL 6.7 7.0 7.1  Albumin 3.7 - 4.7 g/dL 4.3 - 4.1  AST 0 - 40 IU/L _0 ALT 0 - 32 IU/L _1 Alk Phosphatase 39 - 117 IU/L 103 - 74  Total Bilirubin 0.0 - 1.2 mg/dL 0.6 0.6 0.6  Bilirubin, Direct 0.1 -  0.5 mg/dL - - 0.1     The ASCVD Risk score Mikey Bussing DC Jr., et al., 2013) failed to calculate for the following reasons:   The patient has a prior MI or stroke diagnosis   Patient has failed these meds in past: None Patient is currently controlled on the following medications:  . Crestor 89m daily  We discussed: At goal Denies myalgias, although difficult to distinguish in context of severe arthritis  Plan  Continue current medications  Hypertension   BP goal is:  <140/90  Office blood pressures are  BP Readings from Last 3 Encounters:  06/06/20 114/72  05/02/20 123/73  07/25/19 126/72   Patient checks BP at home daily Patient home BP  readings are ranging: NA  Patient has failed these meds in the past: NA Patient is currently controlled on the following medications:  . Entresto 24/278mbid . Toprol XL 5066mid  We discussed: Rare use of Lasix, not used in months  Plan  Continue current medications   Psoriatic and osteoarthritis    Patient has failed these meds in past: NA Patient is currently controlled on the following medications: Otezla, tramadol, gabapentin  We discussed: Gabapentin since 09/2018 Otezla 66m51mily, effective Tramadol about weekly Pain now/typical: 7 Worst: 10  Daughter says OtezRutherford Nailking well and affordable, but feel like she wasn't given any choice. Curious about Enbrel and Humira. Tries to concentrate gabapentin in evening/bedtime.  Plan  Continue current medications   COPD / Asthma / Tobacco    Eosinophil count:   Lab Results  Component Value Date/Time   EOSPCT 3 10/10/2018 07:25 PM   EOSPCT 4.3 05/11/2013 03:47 PM  %                               Eos (Absolute):  Lab Results  Component Value Date/Time   EOSABS 0.3 05/30/2020 03:18 PM   EOSABS 0.3 05/11/2013 03:47 PM    Tobacco Status:  Social History   Tobacco Use  Smoking Status Former Smoker  . Packs/day: 0.50  . Years: 35.00  . Pack years: 17.50  . Types: Cigarettes  Smokeless Tobacco Never Used  Tobacco Comment   Quit approx 1990    Patient has failed these meds in past: NA Patient is currently controlled on the following medications: Symbicort, Flonase, Singulair  Using maintenance inhaler regularly? No Frequency of rescue inhaler use:  never  We discussed: Symbicort as needed, counseled Rare use of Flonase Singulair about once monthly, counseled  Plan  Continue current medications  Medication Management   [facility] Uses pill box? Yes Pt endorses 100% compliance Worried about frequent changes in meds in pill pack, counseled  We discussed:  Spoke with  daughter and patient. Most responses from daughter. Breathing well Memory improved on Aricept, accidentally took double dose for 2 weeks Vivid dreams Counseled on risk of rapid decline for d/c Aricept after long term use and marked improvement  Plan  Continue current medication management strategy  Follow up: 3 month phone visit  Shameek Nyquist Milus HeightarmD, BCGPVillage of the [phone]

## 2020-08-12 NOTE — Patient Instructions (Addendum)
Visit Information  Goals Addressed            This Visit's Progress   . Chronic Care Management       CARE PLAN ENTRY (see longitudinal plan of care for additional care plan information)  Current Barriers:  . Chronic Disease Management support, education, and care coordination needs related to Hypertension, Hyperlipidemia, Heart Failure, and COPD   Hypertension BP Readings from Last 3 Encounters:  06/06/20 114/72  05/02/20 123/73  07/25/19 126/72   . Pharmacist Clinical Goal(s): o Over the next 90 days, patient will work with PharmD and providers to maintain BP goal <140/90 . Current regimen:  o Entresto 24/26mg  twice daily o Metoprolol succinate 50mg  twice daily . Interventions: o None . Patient self care activities - Over the next 90 days, patient will: o Check BP weekly, document, and provide at future appointments o Ensure daily salt intake < 2300 mg/day  Hyperlipidemia Lab Results  Component Value Date/Time   LDLCALC 62 02/16/2019 03:30 PM   . Pharmacist Clinical Goal(s): o Over the next 90 days, patient will work with PharmD and providers to maintain LDL goal < 70 . Current regimen:  o Crestor 5mg  daily . Interventions: o None . Patient self care activities - Over the next 90 days, patient will: o Continue rosuvastatin 5mg  daily  COPD . Pharmacist Clinical Goal(s) o Over the next 90 days, patient will work with PharmD and providers to improve breathing . Current regimen:  o Rare use of Symbicort, Flonase, Singulair . Interventions: o Consider daily use of maintenance medications . Patient self care activities - Over the next 90 days, patient will: o Take montelukast daily  Medication management . Pharmacist Clinical Goal(s): o Over the next 90 days, patient will work with PharmD and providers to maintain optimal medication adherence . [facility] . Interventions o Comprehensive medication review performed. o Continue current medication  management strategy . Patient self care activities - Over the next 90 days, patient will: o Focus on medication adherence by taking COPD medications routinely o Take medications as prescribed o Report any questions or concerns to PharmD and/or provider(s)  Initial goal documentation        Teresa [name] was given information about Chronic Care Management services today including:  1. CCM service includes personalized support from designated clinical staff supervised by her physician, including individualized plan of care and coordination with other care providers 2. 24/7 contact phone numbers for assistance for urgent and routine care needs. 3. Standard insurance, coinsurance, copays and deductibles apply for chronic care management only during months in which we provide at least 20 minutes of these services. Most insurances cover these services at 100%, however patients may be responsible for any copay, coinsurance and/or deductible if applicable. This service may help you avoid the need for more expensive face-to-face services. 4. Only one practitioner may furnish and bill the service in a calendar month. 5. The patient may stop CCM services at any time (effective at the end of the month) by phone call to the office staff.  Patient agreed to services and verbal consent obtained.   Print copy of patient instructions provided.  Telephone follow up appointment with pharmacy team member scheduled for: 3 months  Milus Height, PharmD, [place], CTTS Clinical Pharmacist [facility] [phone]    Chronic Obstructive Pulmonary Disease Chronic obstructive pulmonary disease (COPD) is a long-term (chronic) lung problem. When you have COPD, it is hard for air to get in and  out of your lungs. Usually the condition gets worse over time, and your lungs will never return to normal. There are things you can do to keep yourself as healthy as possible.  Your doctor may treat your condition  with: ? Medicines. ? Oxygen. ? Lung surgery.  Your doctor may also recommend: ? Rehabilitation. This includes steps to make your body work better. It may involve a team of specialists. ? Quitting smoking, if you smoke. ? Exercise and changes to your diet. ? Comfort measures (palliative care). Follow these instructions at home: Medicines  Take over-the-counter and prescription medicines only as told by your doctor.  Talk to your doctor before taking any cough or allergy medicines. You may need to avoid medicines that cause your lungs to be dry. Lifestyle  If you smoke, stop. Smoking makes the problem worse. If you need help quitting, ask your doctor.  Avoid being around things that make your breathing worse. This may include smoke, chemicals, and fumes.  Stay active, but remember to rest as well.  Learn and use tips on how to relax.  Make sure you get enough sleep. Most adults need at least 7 hours of sleep every night.  Eat healthy foods. Eat smaller meals more often. Rest before meals. Controlled breathing Learn and use tips on how to control your breathing as told by your doctor. Try:  Breathing in (inhaling) through your nose for 1 second. Then, pucker your lips and breath out (exhale) through your lips for 2 seconds.  Putting one hand on your belly (abdomen). Breathe in slowly through your nose for 1 second. Your hand on your belly should move out. Pucker your lips and breathe out slowly through your lips. Your hand on your belly should move in as you breathe out.  Controlled coughing Learn and use controlled coughing to clear mucus from your lungs. Follow these steps: 1. Lean your head a little forward. 2. Breathe in deeply. 3. Try to hold your breath for 3 seconds. 4. Keep your mouth slightly open while coughing 2 times. 5. Spit any mucus out into a tissue. 6. Rest and do the steps again 1 or 2 times as needed. General instructions  Make sure you get all the shots  (vaccines) that your doctor recommends. Ask your doctor about a flu shot and a pneumonia shot.  Use oxygen therapy and pulmonary rehabilitation if told by your doctor. If you need home oxygen therapy, ask your doctor if you should buy a tool to measure your oxygen level (oximeter).  Make a COPD action plan with your doctor. This helps you to know what to do if you feel worse than usual.  Manage any other conditions you have as told by your doctor.  Avoid going outside when it is very hot, cold, or humid.  Avoid people who have a sickness you can catch (contagious).  Keep all follow-up visits as told by your doctor. This is important. Contact a doctor if:  You cough up more mucus than usual.  There is a change in the color or thickness of the mucus.  It is harder to breathe than usual.  Your breathing is faster than usual.  You have trouble sleeping.  You need to use your medicines more often than usual.  You have trouble doing your normal activities such as getting dressed or walking around the house. Get help right away if:  You have shortness of breath while resting.  You have shortness of breath that stops you  from: ? Being able to talk. ? Doing normal activities.  Your chest hurts for longer than 5 minutes.  Your skin color is more blue than usual.  Your pulse oximeter shows that you have low oxygen for longer than 5 minutes.  You have a fever.  You feel too tired to breathe normally. Summary  Chronic obstructive pulmonary disease (COPD) is a long-term lung problem.  The way your lungs work will never return to normal. Usually the condition gets worse over time. There are things you can do to keep yourself as healthy as possible.  Take over-the-counter and prescription medicines only as told by your doctor.  If you smoke, stop. Smoking makes the problem worse. This information is not intended to replace advice given to you by your health care provider. Make  sure you discuss any questions you have with your health care provider. Document Revised: 11/20/2017 Document Reviewed: 01/12/2017 Elsevier Patient Education  2020 [employer].

## 2020-08-13 ENCOUNTER — Other Ambulatory Visit: Payer: Self-pay | Admitting: Family Medicine

## 2020-08-13 DIAGNOSIS — G629 Polyneuropathy, unspecified: Secondary | ICD-10-CM

## 2020-08-16 DIAGNOSIS — M48062 Spinal stenosis, lumbar region with neurogenic claudication: Secondary | ICD-10-CM | POA: Diagnosis not present

## 2020-08-16 DIAGNOSIS — M5416 Radiculopathy, lumbar region: Secondary | ICD-10-CM | POA: Diagnosis not present

## 2020-08-16 DIAGNOSIS — M5136 Other intervertebral disc degeneration, lumbar region: Secondary | ICD-10-CM | POA: Diagnosis not present

## 2020-08-24 DIAGNOSIS — I081 Rheumatic disorders of both mitral and tricuspid valves: Secondary | ICD-10-CM | POA: Diagnosis not present

## 2020-08-24 DIAGNOSIS — Z951 Presence of aortocoronary bypass graft: Secondary | ICD-10-CM | POA: Diagnosis not present

## 2020-08-24 DIAGNOSIS — I252 Old myocardial infarction: Secondary | ICD-10-CM | POA: Diagnosis not present

## 2020-08-24 DIAGNOSIS — I4949 Other premature depolarization: Secondary | ICD-10-CM | POA: Diagnosis not present

## 2020-08-24 DIAGNOSIS — I480 Paroxysmal atrial fibrillation: Secondary | ICD-10-CM | POA: Diagnosis not present

## 2020-08-24 DIAGNOSIS — Z7901 Long term (current) use of anticoagulants: Secondary | ICD-10-CM | POA: Diagnosis not present

## 2020-08-24 DIAGNOSIS — Z87891 Personal history of nicotine dependence: Secondary | ICD-10-CM | POA: Diagnosis not present

## 2020-08-24 DIAGNOSIS — I5022 Chronic systolic (congestive) heart failure: Secondary | ICD-10-CM | POA: Diagnosis not present

## 2020-08-24 DIAGNOSIS — Z79899 Other long term (current) drug therapy: Secondary | ICD-10-CM | POA: Diagnosis not present

## 2020-09-11 NOTE — Progress Notes (Signed)
I,Teresa [name],acting as a scribe for Teresa Durie, MD.,have documented all relevant documentation on the behalf of Teresa Durie, MD,as directed by  Teresa Durie, MD while in the presence of Teresa Durie, MD.   Complete physical exam   Patient: Teresa [name]   DOB: 1941-06-20   78 y.o. Female  MRN: [id number] Visit Date: 09/12/2020  Today's healthcare provider: [name], MD   Chief Complaint  Patient presents with  . Annual Exam   Subjective    Teresa [name] is a 79 y.o. female who presents today for a complete physical exam.  She reports consuming a low sodium diet. The patient does not participate in regular exercise at present. She generally feels fairly well. She reports sleeping fairly well. She does not have additional problems to discuss today.  HPI  Patient had AWV with [facility] on 01/10/2020.   Past Medical History:  Diagnosis Date  . Arthritis   . Asthma   . Back pain   . Breast cancer ([place]) 2009   left breast   . CAD (coronary artery disease)    a. 1994 s/p CABG x 1 (LIMA->LAD); b. 03/2015 MV: No ischemia; c. MV 11/18: small fixed apical defect likely secondary to breast attenuation, EF of 42%, frequent PVCs; d. 12/2017 Cath: LM nl, LAD 20p, D1/2/3 nl, LCX min irregs, OM1/2/3 min irregs, RCA nl, RPDA nl, RPL1/2 nl, LIMA->LAD atretic.  Marland Kitchen Chronic combined systolic (congestive) and diastolic (congestive) heart failure ([place])    a. 2013 EF 40%;  b. 03/2015 Echo: EF 55-60%; c. 12/18 Echo: EF of 35-40%; d. 12/2017 TEE: EF 35-40%; e. 03/2018 Echo: EF 40-45%; f. 09/2018 Echo: EF 35%.  . Dental crowns present    caps- left back top, right back bottom  . Gastroesophageal reflux disease   . Hyperlipidemia   . Hypertension   . MI (myocardial infarction) ([place]) 1994  . Mixed Ischemic & Nonischemic cardiomyopathy    a. 2013 EF 40%;  b. 03/2015 Echo: EF 55-60%; c. 12/18 Echo: EF of 35-40%, hypokinesis of the anteroseptal, and apical myocardium, mild to  mod MR, mod dil LA, nl RV fxn, PASP 53 mmHg; d. 12/2017 TEE: EF 35-40%, diff HK, mild to mod MR. small PFO. No LAA/RAA thrombus; e. 03/2018 Echo: EF 40-45%, antsept/inf HK, Gr2 DD, mild MR, mod idl LA, mild to mod TR, PASP 35-46mmHg; f. 09/2018 Echo: EF 35%.  . Persistent atrial fibrillation ([place])    a. diagnosed 01/13/2018; b. CHADS2VASc = 6 --> Eliquis; c. 12/2017 s/p TEE/DCCV. Amio started but d/c'd 01/2018 2/2 n/anorexia; d. 10/2018 DCCV-->recurrent Afib w/in days; e. 10/2018 DCCV x 2 in ED->persistent Afib.  . Personal history of radiation therapy   . Psoriasis   . PSVT (paroxysmal supraventricular tachycardia) ([place])    a. 02/2015 Holter: short runs of SVT and NSVT.  Marland Kitchen Pulmonary hypertension ([place])   . PVC's (premature ventricular contractions)    a. 03/2018 24h Holter: Freq PVC's with a total of 421 beats in 24 hrs. 7 short runs of SVT likely representing Afib.  . Rheumatoid arthritis (HCC)    feet, hands  . Vertigo    approx 2x/yr   Past Surgical History:  Procedure Laterality Date  . ABDOMINAL HYSTERECTOMY    . back fusion    . BREAST BIOPSY Right    1991 negative  . BREAST EXCISIONAL BIOPSY Left    2009 positive  . BREAST LUMPECTOMY Left 2009  . CARDIAC CATHETERIZATION  [place] N/A 01/15/2018   Procedure: CARDIOVERSION;  Surgeon: Wellington Hampshire, MD;  Location: [facility];  Service: Cardiovascular;  Laterality: N/A;  . CARDIOVERSION N/A 10/29/2018   Procedure: CARDIOVERSION;  Surgeon: Minna Merritts, MD;  Location: [facility];  Service: Cardiovascular;  Laterality: N/A;  . CHOLECYSTECTOMY    . CORONARY ARTERY BYPASS GRAFT  1994   1 vessel - [facility]  . FOOT ARTHRODESIS Right 07/24/2016   Procedure: FUSION FIRST METATARSAL CUNEIFORM JOINT RIGHT FOOT, FUSION SECOND METATARSAL CUNEFORM JOINT BUNION REPAIR RIGHT FOOT;  Surgeon: Albertine Patricia, DPM;  Location: [facility];  Service: Podiatry;  Laterality: Right;  . FOOT FRACTURE  SURGERY    . HARDWARE REMOVAL Right 07/24/2016   Procedure: REMOVAL HARDWARE LATERAL MALLEOUS RIGHT ANKLE;  Surgeon: Albertine Patricia, DPM;  Location: [facility];  Service: Podiatry;  Laterality: Right;  REMOVAL OF PIN WHICH WAS INTACT  . HERNIA REPAIR    . NECK SURGERY    . RIGHT/LEFT HEART CATH AND CORONARY/GRAFT ANGIOGRAPHY N/A 01/14/2018   Procedure: LEFT HEART CATH AND CORONARY ANGIOGRAPHY;  Surgeon: Wellington Hampshire, MD;  Location: [facility];  Service: Cardiovascular;  Laterality: N/A;  . TEE WITHOUT CARDIOVERSION N/A 01/15/2018   Procedure: TRANSESOPHAGEAL ECHOCARDIOGRAM (TEE);  Surgeon: Wellington Hampshire, MD;  Location: [facility];  Service: Cardiovascular;  Laterality: N/A;  . TOTAL HIP ARTHROPLASTY Left   . TOTAL KNEE ARTHROPLASTY Left    Social History   Socioeconomic History  . Marital status: Divorced    Spouse name: Not on file  . Number of children: 2  . Years of education: Not on file  . Highest education level: 11th grade  Occupational History  . Occupation: retired  Tobacco Use  . Smoking status: Former Smoker    Packs/day: 0.50    Years: 35.00    Pack years: 17.50    Types: Cigarettes  . Smokeless tobacco: Never Used  . Tobacco comment: Quit approx 1990  Vaping Use  . Vaping Use: Never used  Substance and Sexual Activity  . Alcohol use: No  . Drug use: No  . Sexual activity: Not on file  Other Topics Concern  . Not on file  Social History Narrative  . Not on file   Social Determinants of Health   Financial Resource Strain: Low Risk   . Difficulty of Paying Living Expenses: Not hard at all  Food Insecurity: No Food Insecurity  . Worried About Charity fundraiser in the Last Year: Never true  . Ran Out of Food in the Last Year: Never true  Transportation Needs: No Transportation Needs  . Lack of Transportation (Medical): No  . Lack of Transportation (Non-Medical): No  Physical Activity: Inactive  . Days of Exercise per Week: 0 days   . Minutes of Exercise per Session: 0 min  Stress: No Stress Concern Present  . Feeling of Stress : Not at all  Social Connections: Socially Isolated  . Frequency of Communication with Friends and Family: More than three times a week  . Frequency of Social Gatherings with Friends and Family: Never  . Attends Religious Services: Never  . Active Member of Clubs or Organizations: No  . Attends Archivist Meetings: Never  . Marital Status: Divorced  Human resources officer Violence: Not At Risk  . Fear of Current or Ex-Partner: No  . Emotionally Abused: No  . Physically Abused: No  .  Sexually Abused: No   Family Status  Relation Name Status  . Father  Deceased at age 76  . Mother  Deceased  . Sister  Deceased  . Brother  Alive  . Daughter  Alive  . Son  Alive  . Sister  Alive  . Sister  Alive  . Sister  Deceased  . Brother  Deceased  . Brother  Alive   Family History  Problem Relation Age of Onset  . Heart attack Father   . Congestive Heart Failure Mother   . Breast cancer Mother 7  . Kidney failure Sister   . Prostate cancer Brother   . Bladder Cancer Brother   . Lung cancer Sister   . Arthritis Sister   . Breast cancer Sister   . Arthritis Sister   . Breast cancer Sister   . Lung cancer Sister   . Lung cancer Brother   . Arthritis Brother    Allergies  Allergen Reactions  . Sulfa Antibiotics Hives  . Ivp Dye [Iodinated Diagnostic Agents] Swelling  . Amiodarone Nausea And Vomiting and Cough  . Codeine Other (See Comments)    ALTERED MENTAL STATUS    Patient Care Team: Jerrol Banana., MD as PCP - General (Family Medicine) Wellington Hampshire, MD as PCP - Cardiology (Cardiology) Dasher, Rayvon Char, MD as Consulting Physician (Dermatology) Blanche East, MD as Consulting Physician (Neurosurgery) Estevan Ryder, MD as Referring Physician (Cardiology) Troxler, Adele Schilder as Attending Physician (Podiatry) Marlowe Sax, MD as Referring  Physician (Internal Medicine) Verdia Kuba, [facility] (Pharmacist)   Medications: Outpatient Medications Prior to Visit  Medication Sig  . acetaminophen (TYLENOL) 650 MG CR tablet in the morning and at bedtime.   Marland Kitchen apixaban (ELIQUIS) 5 MG TABS tablet Take 1 tablet (5 mg total) by mouth 2 (two) times daily.  . budesonide-formoterol (SYMBICORT) 160-4.5 MCG/ACT inhaler Inhale 2 puffs into the lungs 2 (two) times daily.  . Cholecalciferol 25 MCG (1000 UT) capsule Take 1,000 Units by mouth daily.   Marland Kitchen donepezil (ARICEPT) 5 MG tablet Take 1 tablet (5 mg total) by mouth at bedtime.  Marland Kitchen ENTRESTO 24-26 MG Take 1 tablet by mouth 2 (two) times daily.  . fluticasone (FLONASE) 50 MCG/ACT nasal spray Place 2 sprays into both nostrils daily as needed for allergies.   . furosemide (LASIX) 20 MG tablet TAKE 1 TABLET (20 MG TOTAL) BY MOUTH DAILY AS NEEDED (SHORTNESS OF BREATH).  Marland Kitchen metoprolol succinate (TOPROL-XL) 50 MG 24 hr tablet TAKE 1 TABLET (50 MG TOTAL) BY MOUTH 2 (TWO) TIMES DAILY  . montelukast (SINGULAIR) 10 MG tablet Take 10 mg by mouth daily as needed (for allergies).   . Multiple Vitamins-Minerals (MULTIVITAMIN PO) Take 1 tablet by mouth daily.  Marland Kitchen OTEZLA 30 MG TABS Take 2 tablets by mouth daily.  . rosuvastatin (CRESTOR) 5 MG tablet TAKE 1 TABLET BY MOUTH EVERY DAY  . traMADol (ULTRAM) 50 MG tablet ONE TAB AS NEEDED FOR JOINT PAIN  . venlafaxine (EFFEXOR) 75 MG tablet TAKE 2 TABLETS (150 MG TOTAL) BY MOUTH DAILY.  . [DISCONTINUED] gabapentin (NEURONTIN) 300 MG capsule TAKE 2 CAPSULES (600 MG TOTAL) BY MOUTH 3 (THREE) TIMES DAILY.   No facility-administered medications prior to visit.    Review of Systems  HENT: Positive for rhinorrhea and tinnitus.   Eyes: Positive for redness.  Musculoskeletal: Positive for arthralgias, back pain, gait problem and neck stiffness.  Allergic/Immunologic: Positive for immunocompromised state.  Hematological: Bruises/bleeds easily.  Psychiatric/Behavioral: Positive  for confusion and sleep disturbance.  All other systems reviewed and are negative.   Last lipids Lab Results  Component Value Date   CHOL 132 02/16/2019   HDL 57 02/16/2019   LDLCALC 62 02/16/2019   TRIG 65 02/16/2019   CHOLHDL 2.3 02/16/2019      Objective    BP 98/62 (BP Location: Right Arm, Patient Position: Sitting, Cuff Size: Large)   Pulse (!) 54   Temp 98.2 F (36.8 C) (Oral)   Resp 16   Ht 5\' 4"  (1.626 m)   Wt 171 lb (77.6 kg)   SpO2 96%   BMI 29.35 kg/m   Physical Exam Vitals reviewed.  Constitutional:      Appearance: Normal appearance. She is well-developed.  HENT:     Head: Normocephalic and atraumatic.     Right Ear: External ear normal.     Left Ear: External ear normal.     Nose: Nose normal.     Mouth/Throat:     Pharynx: Oropharynx is clear.  Eyes:     General: No scleral icterus.    Conjunctiva/sclera: Conjunctivae normal.  Neck:     Thyroid: No thyromegaly.  Cardiovascular:     Rate and Rhythm: Normal rate and regular rhythm.     Heart sounds: Normal heart sounds.  Pulmonary:     Effort: Pulmonary effort is normal.     Breath sounds: Normal breath sounds.  Chest:     Breasts:        Right: Normal.        Left: Normal.  Abdominal:     Palpations: Abdomen is soft.  Musculoskeletal:        General: Deformity present.  Skin:    General: Skin is warm and dry.  Neurological:     General: No focal deficit present.     Mental Status: She is alert and oriented to person, place, and time. Mental status is at baseline.  Psychiatric:        Mood and Affect: Mood normal.        Behavior: Behavior normal.        Thought Content: Thought content normal.        Judgment: Judgment normal.         Last depression screening scores PHQ 2/9 Scores 01/10/2020 01/10/2020 01/05/2019  PHQ - 2 Score 0 0 0  PHQ- 9 Score - - 4   Last fall risk screening Fall Risk  01/10/2020  Falls in the past year? 0  Number falls in past yr: 0  Injury with Fall?  0   Last Audit-C alcohol use screening Alcohol Use Disorder Test (AUDIT) 01/10/2020  1. How often do you have a drink containing alcohol? 0  2. How many drinks containing alcohol do you have on a typical day when you are drinking? 0  3. How often do you have six or more drinks on one occasion? 0  AUDIT-C Score 0   A score of 3 or more in women, and 4 or more in men indicates increased risk for alcohol abuse, EXCEPT if all of the points are from question 1   No results found for any visits on 09/12/20.  Assessment & Plan    Routine Health Maintenance and Physical Exam  Exercise Activities and Dietary recommendations Goals    . Chronic Care Management     CARE PLAN ENTRY (see longitudinal plan of care for additional care plan information)  Current Barriers:  . Chronic Disease Management  support, education, and care coordination needs related to Hypertension, Hyperlipidemia, Heart Failure, and COPD   Hypertension BP Readings from Last 3 Encounters:  06/06/20 114/72  05/02/20 123/73  07/25/19 126/72   . Pharmacist Clinical Goal(s): o Over the next 90 days, patient will work with PharmD and providers to maintain BP goal <140/90 . Current regimen:  o Entresto 24/26mg  twice daily o Metoprolol succinate 50mg  twice daily . Interventions: o None . Patient self care activities - Over the next 90 days, patient will: o Check BP weekly, document, and provide at future appointments o Ensure daily salt intake < 2300 mg/day  Hyperlipidemia Lab Results  Component Value Date/Time   LDLCALC 62 02/16/2019 03:30 PM   . Pharmacist Clinical Goal(s): o Over the next 90 days, patient will work with PharmD and providers to maintain LDL goal < 70 . Current regimen:  o Crestor 5mg  daily . Interventions: o None . Patient self care activities - Over the next 90 days, patient will: o Continue rosuvastatin 5mg  daily  COPD . Pharmacist Clinical Goal(s) o Over the next 90 days, patient will  work with PharmD and providers to improve breathing . Current regimen:  o Rare use of Symbicort, Flonase, Singulair . Interventions: o Consider daily use of maintenance medications . Patient self care activities - Over the next 90 days, patient will: o Take montelukast daily  Medication management . Pharmacist Clinical Goal(s): o Over the next 90 days, patient will work with PharmD and providers to maintain optimal medication adherence . Current pharmacy: [facility] . Interventions o Comprehensive medication review performed. o Continue current medication management strategy . Patient self care activities - Over the next 90 days, patient will: o Focus on medication adherence by taking COPD medications routinely o Take medications as prescribed o Report any questions or concerns to PharmD and/or provider(s)  Initial goal documentation     . Exercise 3x per week (30 min per time)     Recommend to exercise 3 times a week for at least 30 minutes at a time.     . Increase water intake     Recommend to increase fluid intake from 2 - 16 oz water bottles every day to 4 - 16 oz water bottles every day  01/05/19- Recommend to drink at least 6-8 8oz glasses of water per day.       Immunization History  Administered Date(s) Administered  . Fluad Quad(high Dose 65+) 09/12/2020  . Influenza Whole 08/22/2018  . Influenza, High Dose Seasonal PF 10/04/2015, 10/06/2016, 09/16/2017, 08/31/2018, 10/07/2019  . Influenza-Unspecified 07/22/2014  . Moderna SARS-COVID-2 Vaccination 02/02/2020, 03/01/2020  . Pneumococcal Conjugate-13 10/06/2016    Health Maintenance  Topic Date Due  . Hepatitis C Screening  Never done  . TETANUS/TDAP  01/08/2021 (Originally 08/09/2018)  . DEXA SCAN  09/30/2022  . INFLUENZA VACCINE  Completed  . COVID-19 Vaccine  Completed  . PNA vac Low Risk Adult  Completed    Discussed health benefits of physical activity, and encouraged her to engage in regular exercise  appropriate for her age and condition.  1. Encounter for annual physical exam   2. Neuropathy Change gabapentin to Lyrica. - pregabalin (LYRICA) 50 MG capsule; Take 1 capsule (50 mg total) by mouth 2 times daily.  Dispense: 60 capsule; Refill: 5  3. Need for influenza vaccination  - Flu Vaccine QUAD High Dose(Fluad)  4. 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 Atrial fibrillation with RVR (HCC)   6. Lumbar polyradiculopathy   7. Hyperlipidemia, unspecified hyperlipidemia type   8. Malignant neoplasm of female breast, unspecified estrogen receptor status, unspecified laterality, unspecified site of breast ([place])     Return in about 2 months (around 11/12/2020).     I, Teresa Durie, MD, have reviewed all documentation for this visit. The documentation on 09/15/20 for the exam, diagnosis, procedures, and orders are all accurate and complete.    Richard Cranford Mon, MD  [facility] [phone] (phone) [phone] (fax)  [place]

## 2020-09-12 ENCOUNTER — Encounter: Payer: Self-pay | Admitting: Family Medicine

## 2020-09-12 ENCOUNTER — Ambulatory Visit (INDEPENDENT_AMBULATORY_CARE_PROVIDER_SITE_OTHER): Payer: Medicare HMO | Admitting: Family Medicine

## 2020-09-12 ENCOUNTER — Other Ambulatory Visit: Payer: Self-pay

## 2020-09-12 VITALS — BP 98/62 | HR 54 | Temp 98.2°F | Resp 16 | Ht 64.0 in | Wt 171.0 lb

## 2020-09-12 DIAGNOSIS — M5416 Radiculopathy, lumbar region: Secondary | ICD-10-CM | POA: Diagnosis not present

## 2020-09-12 DIAGNOSIS — Z0000 Encounter for general adult medical examination without abnormal findings: Secondary | ICD-10-CM | POA: Diagnosis not present

## 2020-09-12 DIAGNOSIS — E785 Hyperlipidemia, unspecified: Secondary | ICD-10-CM

## 2020-09-12 DIAGNOSIS — I4891 Unspecified atrial fibrillation: Secondary | ICD-10-CM | POA: Diagnosis not present

## 2020-09-12 DIAGNOSIS — Z23 Encounter for immunization: Secondary | ICD-10-CM

## 2020-09-12 DIAGNOSIS — I25728 Atherosclerosis of autologous artery coronary artery bypass graft(s) with other forms of angina pectoris: Secondary | ICD-10-CM | POA: Diagnosis not present

## 2020-09-12 DIAGNOSIS — G629 Polyneuropathy, unspecified: Secondary | ICD-10-CM | POA: Diagnosis not present

## 2020-09-12 DIAGNOSIS — C50919 Malignant neoplasm of unspecified site of unspecified female breast: Secondary | ICD-10-CM

## 2020-09-12 MED ORDER — PREGABALIN 50 MG PO CAPS: 50.0000 mg | ORAL_CAPSULE | Freq: Three times a day (TID) | ORAL | 5 refills | Status: DC

## 2020-09-12 NOTE — Patient Instructions (Addendum)
Stop gabapentin and start Lyrica 50 mg twice daily.

## 2020-09-20 ENCOUNTER — Telehealth: Payer: Self-pay

## 2020-09-20 NOTE — Telephone Encounter (Signed)
PA was started. 

## 2020-09-20 NOTE — Telephone Encounter (Signed)
Copied from [place] [id number]. Topic: General - Inquiry >> Sep 20, 2020  2:21 PM Gillis Ends D wrote: Reason for CRM: Patients daughter called today in reference to the Lyric ordered. The pharmacy stated that It is not covered by her insurance and she needs a prior [place]. Her daughter asked if she can get this started so her mom can get on the medication. Her daughter can be reached at [phone]. Please advise

## 2020-09-21 NOTE — Telephone Encounter (Signed)
PA was approved. 

## 2020-10-03 DIAGNOSIS — H43813 Vitreous degeneration, bilateral: Secondary | ICD-10-CM | POA: Diagnosis not present

## 2020-10-11 ENCOUNTER — Ambulatory Visit (INDEPENDENT_AMBULATORY_CARE_PROVIDER_SITE_OTHER): Payer: Medicare HMO | Admitting: Adult Health

## 2020-10-11 ENCOUNTER — Telehealth: Payer: Self-pay

## 2020-10-11 ENCOUNTER — Encounter: Payer: Self-pay | Admitting: Adult Health

## 2020-10-11 ENCOUNTER — Other Ambulatory Visit: Payer: Self-pay

## 2020-10-11 VITALS — BP 138/45 | HR 54 | Temp 98.1°F | Resp 15 | Wt 176.8 lb

## 2020-10-11 DIAGNOSIS — T148XXA Other injury of unspecified body region, initial encounter: Secondary | ICD-10-CM | POA: Diagnosis not present

## 2020-10-11 DIAGNOSIS — Z23 Encounter for immunization: Secondary | ICD-10-CM | POA: Diagnosis not present

## 2020-10-11 DIAGNOSIS — L089 Local infection of the skin and subcutaneous tissue, unspecified: Secondary | ICD-10-CM

## 2020-10-11 DIAGNOSIS — W540XXA Bitten by dog, initial encounter: Secondary | ICD-10-CM | POA: Diagnosis not present

## 2020-10-11 DIAGNOSIS — S81851A Open bite, right lower leg, initial encounter: Secondary | ICD-10-CM

## 2020-10-11 NOTE — Telephone Encounter (Signed)
Patient and daughter was advised to go to the emergency room for evaluation as dog bite was from unvaccinated dog. Was she given anything at the ER? She was advised to call back if any symptoms worsening at anytime.

## 2020-10-11 NOTE — Telephone Encounter (Signed)
Copied from [place] [id number]. Topic: General - Inquiry >> Oct 11, 2020  3:25 PM Alease Frame wrote: Reason for CRM:Pt called in stating Sharyn Lull ( Nurse) was supposed to call her in a antibiotic . Please reach out to pt

## 2020-10-11 NOTE — Patient Instructions (Addendum)
Advised to go to the emergency room now due to unvaccinated dog bite for rabies prophylaxis/ vaccination.   TDAP was given in office today.    CDC Recommendations for wound care:  Regardless of the risk of rabies, bite wounds can cause serious injury such as nerve or tendon laceration and local and system infection. Your doctor will determine the best way to care for your wound, and will also consider how to treat the wound for the best possible cosmetic results.  For many types of bite wounds, immediate gentle irrigation with water or a dilute water povidone-iodine solution has been shown to markedly decrease the risk of bacterial infection.  Wound cleansing is especially important in rabies prevention since, in animal studies, thorough wound cleansing alone without other postexposure prophylaxis has been shown to markedly reduce the likelihood of rabies.  You should receive a tetanus shot if you have not been immunized in ten years. Decisions regarding the use of antibiotics, and primary wound closure should be decided together with your doctor.  CDC Recommendations for vaccination/immunoglobulin: A healthy domestic dog, cat, or ferret that bites a person should be confined and observed for 10 days. Any illness in the animal during the confinement period or before release should be evaluated by a Animal nutritionist and reported immediately to the local public health department.  If signs suggestive of rabies develop, postexposure prophylaxis should be initiated. The animal should be euthanized and its head removed and shipped, under refrigeration, for examination by a qualified laboratory.  If the biting animal is stray or unwanted, it should either be confined and observed for 10 days or be euthanized immediately and submitted for rabies examination.  Skunks, raccoons, foxes and bats that bite humans should be euthanized and tested as soon as possible. The length of time between rabies virus  appearing in the saliva and onset of symptoms is unknown for these animals and holding them for observation is not acceptable.  After exposure to wildlife in which rabies is suspected, prophylaxis is warranted in most circumstances. Because the period of rabies virus shedding in wild animal hybrids is unknown, these animals should be euthanized and tested rather than confined and observed when they bite humans.  Vaccination should be discontinued if tests of the involved animal are negative for rabies infection.          https://www.cdc.gov/vaccines/hcp/vis/vis-statements/tdap.pdf">  Tdap (Tetanus, Diphtheria, Pertussis) Vaccine: What You Need to Know 1. Why get vaccinated? Tdap vaccine can prevent tetanus, diphtheria, and pertussis. Diphtheria and pertussis spread from person to person. Tetanus enters the body through cuts or wounds.  TETANUS (T) causes painful stiffening of the muscles. Tetanus can lead to serious health problems, including being unable to open the mouth, having trouble swallowing and breathing, or death.  DIPHTHERIA (D) can lead to difficulty breathing, heart failure, paralysis, or death.  PERTUSSIS (aP), also known as "whooping cough," can cause uncontrollable, violent coughing which makes it hard to breathe, eat, or drink. Pertussis can be extremely serious in babies and young children, causing pneumonia, convulsions, brain damage, or death. In teens and adults, it can cause weight loss, loss of bladder control, passing out, and rib fractures from severe coughing. 2. Tdap vaccine Tdap is only for children 7 years and older, adolescents, and adults.  Adolescents should receive a single dose of Tdap, preferably at age 54 or 61 years. Pregnant women should get a dose of Tdap during every pregnancy, to protect the newborn from pertussis. Infants are most at risk  for severe, life-threatening complications from pertussis. Adults who have never received Tdap should get a dose  of Tdap. Also, adults should receive a booster dose every 10 years, or earlier in the case of a severe and dirty wound or burn. Booster doses can be either Tdap or Td (a different vaccine that protects against tetanus and diphtheria but not pertussis). Tdap may be given at the same time as other vaccines. 3. Talk with your health care provider Tell your vaccine provider if the person getting the vaccine:  Has had an allergic reaction after a previous dose of any vaccine that protects against tetanus, diphtheria, or pertussis, or has any severe, life-threatening allergies.  Has had a coma, decreased level of consciousness, or prolonged seizures within 7 days after a previous dose of any pertussis vaccine (DTP, DTaP, or Tdap).  Has seizures or another nervous system problem.  Has ever had Guillain-Barr Syndrome (also called GBS).  Has had severe pain or swelling after a previous dose of any vaccine that protects against tetanus or diphtheria. In some cases, your health care provider may decide to postpone Tdap vaccination to a future visit.  People with minor illnesses, such as a cold, may be vaccinated. People who are moderately or severely ill should usually wait until they recover before getting Tdap vaccine.  Your health care provider can give you more information. 4. Risks of a vaccine reaction  Pain, redness, or swelling where the shot was given, mild fever, headache, feeling tired, and nausea, vomiting, diarrhea, or stomachache sometimes happen after Tdap vaccine. People sometimes faint after medical procedures, including vaccination. Tell your provider if you feel dizzy or have vision changes or ringing in the ears.  As with any medicine, there is a very remote chance of a vaccine causing a severe allergic reaction, other serious injury, or death. 5. What if there is a serious problem? An allergic reaction could occur after the vaccinated person leaves the clinic. If you see signs of a  severe allergic reaction (hives, swelling of the face and throat, difficulty breathing, a fast heartbeat, dizziness, or weakness), call 9-1-1 and get the person to the nearest hospital. For other signs that concern you, call your health care provider.  Adverse reactions should be reported to the Vaccine Adverse Event Reporting System (VAERS). Your health care provider will usually file this report, or you can do it yourself. Visit the VAERS website at www.vaers.SamedayNews.es or call 5033205989. VAERS is only for reporting reactions, and VAERS staff do not give medical advice. 6. The National Vaccine Injury Compensation Program The [employer] Vaccine Injury Compensation Program (VICP) is a federal program that was created to compensate people who may have been injured by certain vaccines. Visit the VICP website at GoldCloset.com.ee or call 587-025-1649 to learn about the program and about filing a claim. There is a time limit to file a claim for compensation. 7. How can I learn more?  Ask your health care provider.  Call your local or state health department.  Contact the Centers for Disease Control and Prevention (CDC): ? Call 765-073-0731 (1-800-CDC-INFO) or ? Visit CDC's website at http://hunter.com/ Vaccine Information Statement Tdap (Tetanus, Diphtheria, Pertussis) Vaccine (03/23/2019) This information is not intended to replace advice given to you by your health care provider. Make sure you discuss any questions you have with your health care provider. Document Revised: 04/01/2019 Document Reviewed: 04/04/2019 Elsevier Patient Education  [place], Adult Animal bite wounds can be mild or serious. It  is important to get medical treatment to prevent infection. Ask your doctor if you need treatment to prevent an infection that can spread from animals to humans (rabies). Follow these instructions at home: Wound care   Follow instructions from your  doctor about how to take care of your wound. Make sure you: ? Wash your hands with soap and water before you change your bandage (dressing). If you cannot use soap and water, use hand sanitizer. ? Change your bandage as told by your doctor. ? Leave stitches (sutures), skin glue, or skin tape (adhesive) strips in place. They may need to stay in place for 2 weeks or longer. If tape strips get loose and curl up, you may trim the loose edges. Do not remove tape strips completely unless your doctor says it is okay.  Check your wound every day for signs of infection. Check for: ? More redness, swelling, or pain. ? More fluid or blood. ? Warmth. ? Pus or a bad smell. Medicines  Take or apply over-the-counter and prescription medicines only as told by your doctor.  If you were prescribed an antibiotic, take or apply it as told by your doctor. Do not stop using the antibiotic even if your wound gets better. General instructions   Keep the injured area raised (elevated) above the level of your heart while you are sitting or lying down.  If directed, put ice on the injured area. ? Put ice in a plastic bag. ? Place a towel between your skin and the bag. ? Leave the ice on for 20 minutes, 2-3 times per day.  Keep all follow-up visits as told by your doctor. This is important. Contact a doctor if:  You have more redness, swelling, or pain around your wound.  Your wound feels warm to the touch.  You have a fever or chills.  You have a general feeling of sickness (malaise).  You feel sick to your stomach (nauseous).  You throw up (vomit).  You have pain that does not get better. Get help right away if:  You have a red streak going away from your wound.  You have any of these coming from your wound: ? Non-clear fluid. ? More blood. ? Pus or a bad smell.  You have trouble moving your injured area.  You lose feeling (have numbness) or feel tingling anywhere on your  body. Summary  It is important to get the right medical treatment for animal bites. Treatment can help you to not get an infection. Ask your doctor if you need treatment to prevent an infection that can spread from animals to humans (rabies).  Check your wound every day for signs of infection, such as more redness or swelling instead of less.  If you have a red streak going away from your wound, get medical help right away. This information is not intended to replace advice given to you by your health care provider. Make sure you discuss any questions you have with your health care provider. Document Revised: 12/03/2017 Document Reviewed: 06/18/2017 Elsevier Patient Education  [place].

## 2020-10-11 NOTE — Progress Notes (Signed)
Established patient visit   Patient: Teresa [name]   DOB: February 09, 1941   79 y.o. Female  MRN: [id number] Visit Date: 10/11/2020  Today's healthcare provider: Marcille Buffy, FNP   Chief Complaint  Patient presents with   Animal Bite   Subjective    Animal Bite  The incident occurred more than 2 days ago. The incident occurred at home. There is an injury to the right lower leg. Pertinent negatives include no abdominal pain, no bowel incontinence, no nausea, no vomiting, no inability to bear weight, no neck pain, no pain when bearing weight, no focal weakness, no decreased responsiveness, no light-headedness, no loss of consciousness, no seizures, no tingling, no weakness and no memory loss.   Incident occurred on 10/05/2020 Friday in the afternoon, unprovoked dog bite.  She was bitten by her brothers indoor dog, yorkie never been given any  vaccines or rabies vaccines in the past.   Patient is on Eliquis. 5mg  qd. Hemostasis has been achieved. Daughter cleaned the area with hydrogen peroxide since the incident occurred. denies any bleeding, drainage or pain.  Does have some bruising and surrounding redness  Td last was denies in the last was 08/09/2008- will update this in office.   Patient  denies any fever, body aches,chills, rash, chest pain, shortness of breath, nausea, vomiting, or diarrhea.      Patient Active Problem List   Diagnosis Date Noted   Neuropathy 05/02/2020   Chronic obstructive pulmonary disease ([place]) 03/11/2019   Atrial fibrillation ([place]) 11/11/2018   A-fib ([place]) 10/12/2018   Rheumatoid arthritis ([place]) 10/10/2018   Atrial fibrillation with RVR ([place]) 10/10/2018   Psoriatic arthritis ([place]) 03/04/2018   Unstable angina (HCC)    Atrial fibrillation with rapid ventricular response ([place]) 01/13/2018   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   Asthma 05/09/2017   Primary osteoarthritis of both first carpometacarpal joints 01/31/2016    Arthralgia of both hands 01/31/2016   Ventricular premature depolarization 09/11/2015   Psoriasis 07/16/2015   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   Hyperlipidemia 03/29/2015   Pre-syncope 03/04/2015   CAD (coronary artery disease)    PVC's (premature ventricular contractions)    Ischemic cardiomyopathy    DDD (degenerative disc disease), lumbar 01/18/2015   Intervertebral disc disorder with radiculopathy of lumbar region 12/26/2014   L-S radiculopathy 07/03/2014   Breast CA ([place]) 06/28/2014   Arthritis, degenerative 06/28/2014   Acid reflux 03/08/2014   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 1994 s/p CABG x 1 (LIMA->LAD); b. 03/2015 MV: No ischemia; c. MV 11/18: small fixed apical defect likely secondary to breast attenuation, EF of 42%, frequent PVCs; d. 12/2017 Cath: LM nl, LAD 20p, D1/2/3 nl, LCX min irregs, OM1/2/3 min irregs, RCA nl, RPDA nl, RPL1/2 nl, LIMA->LAD atretic.   Chronic combined systolic (congestive) and diastolic (congestive) heart failure ([place])    a. 2013 EF 40%;  b. 03/2015 Echo: EF 55-60%; c. 12/18 Echo: EF of 35-40%; d. 12/2017 TEE: EF 35-40%; e. 03/2018 Echo: EF 40-45%; f. 09/2018 Echo: EF 35%.   Dental crowns present    caps- left back top, right back bottom   Gastroesophageal reflux disease    Hyperlipidemia    Hypertension    MI (myocardial infarction) (  [place]) 1994   Mixed Ischemic & Nonischemic cardiomyopathy    a. 2013 EF 40%;  b. 03/2015 Echo: EF 55-60%; c. 12/18 Echo: EF of 35-40%, hypokinesis of the anteroseptal, and apical myocardium, mild to mod MR, mod dil LA, nl RV fxn, PASP 53 mmHg; d. 12/2017 TEE: EF 35-40%, diff HK, mild to mod MR. small PFO. No LAA/RAA thrombus; e. 03/2018 Echo: EF 40-45%, antsept/inf HK, Gr2 DD, mild MR, mod idl LA, mild to mod  TR, PASP 35-24mmHg; f. 09/2018 Echo: EF 35%.   Persistent atrial fibrillation ([place])    a. diagnosed 01/13/2018; b. CHADS2VASc = 6 --> Eliquis; c. 12/2017 s/p TEE/DCCV. Amio started but d/c'd 01/2018 2/2 n/anorexia; d. 10/2018 DCCV-->recurrent Afib w/in days; e. 10/2018 DCCV x 2 in ED->persistent Afib.   Personal history of radiation therapy    Psoriasis    PSVT (paroxysmal supraventricular tachycardia) ([place])    a. 02/2015 Holter: short runs of SVT and NSVT.   Pulmonary hypertension (HCC)    PVC's (premature ventricular contractions)    a. 03/2018 24h Holter: Freq PVC's with a total of 421 beats in 24 hrs. 7 short runs of SVT likely representing Afib.   Rheumatoid arthritis ([place])    feet, hands   Vertigo    approx 2x/yr   Past Surgical History:  Procedure Laterality Date   ABDOMINAL HYSTERECTOMY     back fusion     BREAST BIOPSY Right    1991 negative   BREAST EXCISIONAL BIOPSY Left    2009 positive   BREAST LUMPECTOMY Left 2009   CARDIAC CATHETERIZATION     [facility]   CARDIAC CATHETERIZATION     [place]   CARDIOVERSION N/A 01/15/2018   Procedure: CARDIOVERSION;  Surgeon: Wellington Hampshire, MD;  Location: [facility];  Service: Cardiovascular;  Laterality: N/A;   CARDIOVERSION N/A 10/29/2018   Procedure: CARDIOVERSION;  Surgeon: Minna Merritts, MD;  Location: [facility];  Service: Cardiovascular;  Laterality: N/A;   CHOLECYSTECTOMY     CORONARY ARTERY BYPASS GRAFT  1994   1 vessel - [facility]   FOOT ARTHRODESIS Right 07/24/2016   Procedure: FUSION FIRST METATARSAL CUNEIFORM JOINT RIGHT FOOT, FUSION SECOND METATARSAL CUNEFORM JOINT BUNION REPAIR RIGHT FOOT;  Surgeon: Albertine Patricia, DPM;  Location: [facility];  Service: Podiatry;  Laterality: Right;   FOOT FRACTURE SURGERY     HARDWARE REMOVAL Right 07/24/2016   Procedure: REMOVAL HARDWARE LATERAL MALLEOUS RIGHT ANKLE;  Surgeon: Albertine Patricia, DPM;  Location: [facility];  Service: Podiatry;  Laterality: Right;   REMOVAL OF PIN WHICH WAS INTACT   HERNIA REPAIR     NECK SURGERY     RIGHT/LEFT HEART CATH AND CORONARY/GRAFT ANGIOGRAPHY N/A 01/14/2018   Procedure: LEFT HEART CATH AND CORONARY ANGIOGRAPHY;  Surgeon: Wellington Hampshire, MD;  Location: [facility];  Service: Cardiovascular;  Laterality: N/A;   TEE WITHOUT CARDIOVERSION N/A 01/15/2018   Procedure: TRANSESOPHAGEAL ECHOCARDIOGRAM (TEE);  Surgeon: Wellington Hampshire, MD;  Location: [facility];  Service: Cardiovascular;  Laterality: N/A;   TOTAL HIP ARTHROPLASTY Left    TOTAL KNEE ARTHROPLASTY Left    Social History   Tobacco Use   Smoking status: Former Smoker    Packs/day: 0.50    Years: 35.00    Pack years: 17.50    Types: Cigarettes   Smokeless tobacco: Never Used   Tobacco comment: Quit approx 1990  Vaping Use   Vaping Use: Never used  Substance Use Topics   Alcohol use: No  Drug use: No   Social History   Socioeconomic History   Marital status: Divorced    Spouse name: Not on file   Number of children: 2   Years of education: Not on file   Highest education level: 11th grade  Occupational History   Occupation: retired  Tobacco Use   Smoking status: Former Smoker    Packs/day: 0.50    Years: 35.00    Pack years: 17.50    Types: Cigarettes   Smokeless tobacco: Never Used   Tobacco comment: Quit approx Engineer, manufacturing systems Use: Never used  Substance and Sexual Activity   Alcohol use: No   Drug use: No   Sexual activity: Not on file  Other Topics Concern   Not on file  Social History Narrative   Not on file   Social Determinants of Health   Financial Resource Strain: Low Risk    Difficulty of Paying Living Expenses: Not hard at all  Food Insecurity: No Food Insecurity   Worried About Charity fundraiser in the Last Year: Never true   Arboriculturist in the Last Year: Never true  Transportation Needs: No Transportation Needs   Lack of Transportation (Medical): No    Lack of Transportation (Non-Medical): No  Physical Activity: Inactive   Days of Exercise per Week: 0 days   Minutes of Exercise per Session: 0 min  Stress: No Stress Concern Present   Feeling of Stress : Not at all  Social Connections: Socially Isolated   Frequency of Communication with Friends and Family: More than three times a week   Frequency of Social Gatherings with Friends and Family: Never   Attends Religious Services: Never   Marine scientist or Organizations: No   Attends Music therapist: Never   Marital Status: Divorced  Human resources officer Violence: Not At Risk   Fear of Current or Ex-Partner: No   Emotionally Abused: No   Physically Abused: No   Sexually Abused: No   Family Status  Relation Name Status   Father  Deceased at age 51   Mother  Deceased   Sister  Deceased   Brother  Comptroller   Daughter  Alive   Son  Alive   Sister  Alive   Sister  Alive   Sister  Deceased   Brother  Deceased   Brother  Alive   Family History  Problem Relation Age of Onset   Heart attack Father    Congestive Heart Failure Mother    Breast cancer Mother 12   Kidney failure Sister    Prostate cancer Brother    Bladder Cancer Brother    Lung cancer Sister    Arthritis Sister    Breast cancer Sister    Arthritis Sister    Breast cancer Sister    Lung cancer Sister    Lung cancer Brother    Arthritis Brother    Allergies  Allergen Reactions   Sulfa Antibiotics Hives   Ivp Dye [Iodinated Diagnostic Agents] Swelling   Amiodarone Nausea And Vomiting and Cough   Codeine Other (See Comments)    ALTERED MENTAL STATUS       Medications: Outpatient Medications Prior to Visit  Medication Sig   acetaminophen (TYLENOL) 650 MG CR tablet in the morning and at bedtime.    apixaban (ELIQUIS) 5 MG TABS tablet Take 1 tablet (5 mg total) by mouth 2 (two) times daily.   budesonide-formoterol (SYMBICORT) 160-4.5  MCG/ACT inhaler  Inhale 2 puffs into the lungs 2 (two) times daily.   Cholecalciferol 25 MCG (1000 UT) capsule Take 1,000 Units by mouth daily.    donepezil (ARICEPT) 5 MG tablet Take 1 tablet (5 mg total) by mouth at bedtime.   ENTRESTO 24-26 MG Take 1 tablet by mouth 2 (two) times daily.   fluticasone (FLONASE) 50 MCG/ACT nasal spray Place 2 sprays into both nostrils daily as needed for allergies.    furosemide (LASIX) 20 MG tablet TAKE 1 TABLET (20 MG TOTAL) BY MOUTH DAILY AS NEEDED (SHORTNESS OF BREATH).   metoprolol succinate (TOPROL-XL) 50 MG 24 hr tablet TAKE 1 TABLET (50 MG TOTAL) BY MOUTH 2 (TWO) TIMES DAILY   montelukast (SINGULAIR) 10 MG tablet Take 10 mg by mouth daily as needed (for allergies).    Multiple Vitamins-Minerals (MULTIVITAMIN PO) Take 1 tablet by mouth daily.   OTEZLA 30 MG TABS Take 2 tablets by mouth daily.   pregabalin (LYRICA) 50 MG capsule Take 1 capsule (50 mg total) by mouth 3 (three) times daily.   rosuvastatin (CRESTOR) 5 MG tablet TAKE 1 TABLET BY MOUTH EVERY DAY   traMADol (ULTRAM) 50 MG tablet ONE TAB AS NEEDED FOR JOINT PAIN   venlafaxine (EFFEXOR) 75 MG tablet TAKE 2 TABLETS (150 MG TOTAL) BY MOUTH DAILY.   No facility-administered medications prior to visit.    Review of Systems  Constitutional: Negative for decreased responsiveness.  Gastrointestinal: Negative for abdominal pain, bowel incontinence, nausea and vomiting.  Musculoskeletal: Negative for neck pain.  Neurological: Negative for tingling, focal weakness, seizures, loss of consciousness, weakness and light-headedness.  Psychiatric/Behavioral: Negative for memory loss.    Last CBC Lab Results  Component Value Date   WBC 5.5 05/30/2020   HGB 14.0 05/30/2020   HCT 43.0 05/30/2020   MCV 94 05/30/2020   MCH 30.6 05/30/2020   RDW 13.1 05/30/2020   PLT 230 88/41/6606   Last metabolic panel Lab Results  Component Value Date   GLUCOSE 100 (H) 02/16/2019   NA 144 02/16/2019   K 4.4  02/16/2019   CL 104 02/16/2019   CO2 23 02/16/2019   BUN 11 02/16/2019   CREATININE 0.92 02/16/2019   GFRNONAA 60 02/16/2019   GFRAA 69 02/16/2019   CALCIUM 9.6 02/16/2019   PROT 6.7 02/16/2019   ALBUMIN 4.3 02/16/2019   LABGLOB 2.4 02/16/2019   AGRATIO 1.8 02/16/2019   BILITOT 0.6 02/16/2019   ALKPHOS 103 02/16/2019   AST 19 02/16/2019   ALT 17 02/16/2019   ANIONGAP 8 11/12/2018      Objective    BP (!) 138/45    Pulse (!) 54    Temp 98.1 F (36.7 C) (Oral)    Resp 15    Wt 176 lb 12.8 oz (80.2 kg)    SpO2 96%    BMI 30.35 kg/m  BP Readings from Last 3 Encounters:  10/11/20 (!) 138/45  09/12/20 98/62  06/06/20 114/72      Physical Exam Vitals reviewed.  Constitutional:      General: She is not in acute distress.    Appearance: Normal appearance. She is not ill-appearing, toxic-appearing or diaphoretic.     Comments: Patient is alert and oriented and responsive to questions Engages in eye contact with provider. Speaks in full sentences without any pauses without any shortness of breath or distress.    HENT:     Head: Normocephalic and atraumatic.  Eyes:     Conjunctiva/sclera: Conjunctivae normal.  Pupils: Pupils are equal, round, and reactive to light.  Cardiovascular:     Rate and Rhythm: Regular rhythm. Bradycardia present.     Pulses: Normal pulses.     Heart sounds: Normal heart sounds. No murmur heard.  No friction rub. No gallop.   Pulmonary:     Effort: Pulmonary effort is normal. No respiratory distress.     Breath sounds: Normal breath sounds. No stridor. No wheezing, rhonchi or rales.  Chest:     Chest wall: No tenderness.  Abdominal:     Palpations: Abdomen is soft.  Musculoskeletal:        General: Swelling (mild right lower leg posterior at injury site. 2+ DP pulse.) and signs of injury present. No tenderness.     Cervical back: Normal range of motion and neck supple.     Right lower leg: No edema.     Left lower leg: No edema.      Comments: Denies  any bone pain.   Skin:    General: Skin is warm.     Capillary Refill: Capillary refill takes less than 2 seconds.     Findings: Bruising, ecchymosis, erythema (mild, no warmth, hemostatic wound, with dark colored scab at outer edges of puncture wound,. No foriegn body visualized. ), signs of injury (dog bite see image below right) and wound present.  Neurological:     Mental Status: She is alert and oriented to person, place, and time.     Gait: Gait normal.  Psychiatric:        Mood and Affect: Mood normal.        Behavior: Behavior normal.        Thought Content: Thought content normal.        Judgment: Judgment normal.     Media Information   Document Information  Photos  Right lower leg posterior   10/11/2020 13:26  Attached To:  Office Visit on 10/11/20 with Archibald Marchetta, Kelby Aline, FNP  Source Information  Jarrett Albor, Kelby Aline, FNP   [facility]    No results found for any visits on 10/11/20.  Assessment & Plan     ADVISED GO TO THE EMERGENCY ROOM NOW  UNVACCINATED UNPROVOKED DOG BITE.  1. Dog bite of right lower leg with infection, initial encounter Discussed given dog has never been vaccinated for rabies or other illness, she will need to go to the emergency room now for rabies evaluation. Animal control report will be done in the emergency room.   2. Need for Tdap vaccination Will update TDAP while she is here today. After care provided.  - Tdap vaccine greater than or equal to 7yo IM  3. Puncture wound Defer care to Emergency room, will not prescribe antibiotics at visit, she is advised she will be evaluated in the emergency room and to follow back up with [place] as directed or in 1 week and sooner if any signs of worsening infection. Unlikely foreign body, none visualized or palpated.   4. Need for prophylactic vaccination against rabies Patient agrees to go to the emergency room, reviewed CDC guidelines and given in after visit summary.    Daughter and patient verbalized understanding and agree to go to the emergency room now.  Return in about 1 week (around 10/18/2020), or if symptoms worsen or fail to improve, for at any time for any worsening symptoms.        Marcille Buffy, [place] [phone] (phone) [phone] (fax)  [facility]

## 2020-10-11 NOTE — Telephone Encounter (Signed)
Are you aware if this, I never told patient she would get antibiotic did you? KW

## 2020-10-12 ENCOUNTER — Other Ambulatory Visit: Payer: Self-pay | Admitting: Family Medicine

## 2020-10-12 ENCOUNTER — Other Ambulatory Visit: Payer: Self-pay | Admitting: Adult Health

## 2020-10-12 DIAGNOSIS — G629 Polyneuropathy, unspecified: Secondary | ICD-10-CM

## 2020-10-12 MED ORDER — AMOXICILLIN-POT CLAVULANATE 875-125 MG PO TABS
1.0000 | ORAL_TABLET | Freq: Two times a day (BID) | ORAL | 0 refills | Status: DC
Start: 2020-10-12 — End: 2020-11-09

## 2020-10-12 NOTE — Telephone Encounter (Signed)
I will send in Augmentin for wound. However still give firm recommendation same as in office visit that due to complications from dog bites including  rabies and serious consequences including death that can occur from not receiving rabies treatment in the ER as advised in a bite from an unvaccinated dog. Provider still advises to be seen in emergency room now for rabies recommendation. Suggest trying an alternate emergency room.

## 2020-10-12 NOTE — Telephone Encounter (Signed)
Noted. My apologies but it is in patients best interest to go. Augmentin was sent in as previously informed patient.

## 2020-10-12 NOTE — Telephone Encounter (Signed)
Please see message below. KW 

## 2020-10-12 NOTE — Telephone Encounter (Signed)
I advised patient of message below, she was unhappy that we still advised for her to return to ED for evaluation, patient states that she wish she had not called our office about the animal bite. Patient states that she will not give information about the pet because it would be to much, she states that she will not got to ED and she will not pick up antibiotic prescribed.KW

## 2020-10-12 NOTE — Telephone Encounter (Signed)
Pt called back saying she went to the ED but they were packed.  She didn't  feel like she should wait.  She wants to know if Dr. [name] would give he the antibiotic.  [facility]  CB#  [phone]

## 2020-10-12 NOTE — Telephone Encounter (Signed)
Pt is calling back and would like to know if abx will be called into her pharm today

## 2020-11-08 ENCOUNTER — Telehealth: Payer: Self-pay

## 2020-11-08 NOTE — Telephone Encounter (Signed)
Copied from [place] [phone]. Topic: General - Other >> Nov 08, 2020  1:43 PM Hinda Lenis D wrote: PT wants know when her last pneumonia shot was /please advise   Pt advised her last pneumonia vaccine was 10/16/20217.  She is UTD.  Thanks,   -Mickel Baas

## 2020-11-09 ENCOUNTER — Ambulatory Visit: Payer: Self-pay

## 2020-11-09 DIAGNOSIS — I4891 Unspecified atrial fibrillation: Secondary | ICD-10-CM

## 2020-11-09 DIAGNOSIS — E785 Hyperlipidemia, unspecified: Secondary | ICD-10-CM

## 2020-11-09 NOTE — Chronic Care Management (AMB) (Signed)
 Chronic Care Management Pharmacy  Name: Teresa [name]  MRN: [id number] DOB: 08/14/1941  Chief Complaint/ HPI  Teresa [name],  79 y.o. , female presents for their Follow-Up CCM visit with the clinical pharmacist via telephone. Today's visit was done with Teresa [name], who provides 100% of the history and manages her mother's medications.   PCP : Gilbert, Richard L Jr., MD  Their chronic conditions include: Hypertension, Hyperlipidemia, Atrial Fibrillation, Heart Failure, Coronary Artery Disease, COPD, Chronic Kidney Disease, and Chronic Pain  Office Visits: 10/11/20: Patient presented to Teresa Flinchum, FNP for dog bite. No medication changes made.  09/12/20: Patient presented to Dr. Gilbert for follow-up. Gabapentin stopped, Lyrica 50 mg TID started.  6/16 neuropathy, BP 114/72 P 56 Wt 172 BMI 29.5  Consult Visit: 08/24/20: Patient presented to Teresa [name] (cardiology) for HF follow-up. No medication changes made.  7/13 psoriatic arthritis, BP 188/70, Otezal, never tried Enbrel or Humira, also OA, d/c tramadol, 5/12 inc gabapentin 600mg tid, try for bigger qhs dose  Medications: Outpatient Encounter Medications as of 11/09/2020  Medication Sig  . apixaban (ELIQUIS) 5 MG TABS tablet Take 1 tablet (5 mg total) by mouth 2 (two) times daily.  . acetaminophen (TYLENOL) 650 MG CR tablet in the morning and at bedtime.   . amoxicillin-clavulanate (AUGMENTIN) 875-125 MG tablet Take 1 tablet by mouth 2 (two) times daily.  . budesonide-formoterol (SYMBICORT) 160-4.5 MCG/ACT inhaler Inhale 2 puffs into the lungs 2 (two) times daily.  . Cholecalciferol 25 MCG (1000 UT) capsule Take 1,000 Units by mouth daily.   . donepezil (ARICEPT) 5 MG tablet Take 1 tablet (5 mg total) by mouth at bedtime.  . ENTRESTO 24-26 MG Take 1 tablet by mouth 2 (two) times daily.  . fluticasone (FLONASE) 50 MCG/ACT nasal spray Place 2 sprays into both nostrils daily as needed for allergies.   . furosemide (LASIX) 20  MG tablet TAKE 1 TABLET (20 MG TOTAL) BY MOUTH DAILY AS NEEDED (SHORTNESS OF BREATH).  . metoprolol succinate (TOPROL-XL) 50 MG 24 hr tablet TAKE 1 TABLET (50 MG TOTAL) BY MOUTH 2 (TWO) TIMES DAILY  . montelukast (SINGULAIR) 10 MG tablet Take 10 mg by mouth daily as needed (for allergies).   . Multiple Vitamins-Minerals (MULTIVITAMIN PO) Take 1 tablet by mouth daily.  . OTEZLA 30 MG TABS Take 2 tablets by mouth daily.  . rosuvastatin (CRESTOR) 5 MG tablet TAKE 1 TABLET BY MOUTH EVERY DAY  . traMADol (ULTRAM) 50 MG tablet ONE TAB AS NEEDED FOR JOINT PAIN  . venlafaxine (EFFEXOR) 75 MG tablet TAKE 2 TABLETS (150 MG TOTAL) BY MOUTH DAILY.  . [DISCONTINUED] pregabalin (LYRICA) 50 MG capsule Take 1 capsule (50 mg total) by mouth 3 (three) times daily.   No facility-administered encounter medications on file as of 11/09/2020.    Current Diagnosis/Assessment:  SDOH Interventions     Most Recent Value  SDOH Interventions  Financial Strain Interventions Intervention Not Indicated  Transportation Interventions Intervention Not Indicated      Goals Addressed            This Visit's Progress   . Chronic Care Management       CARE PLAN ENTRY (see longitudinal plan of care for additional care plan information)  Current Barriers:  . Chronic Disease Management support, education, and care coordination needs related to Hypertension, Hyperlipidemia, Atrial Fibrillation, Heart Failure, Coronary Artery Disease, COPD, Chronic Kidney Disease, and Chronic Pain   Hypertension BP Readings from Last 3 Encounters:    06/06/20 114/72  05/02/20 123/73  07/25/19 126/72   . Pharmacist Clinical Goal(s): o Over the next 90 days, patient will work with PharmD and providers to maintain BP goal <140/90 . Current regimen:  o Entresto 24/26mg twice daily o Metoprolol succinate 50mg twice daily . Interventions: o None . Patient self care activities - Over the next 90 days, patient will: o Check BP weekly,  document, and provide at future appointments o Ensure daily salt intake < 2300 mg/day  Hyperlipidemia Lab Results  Component Value Date/Time   LDLCALC 62 02/16/2019 03:30 PM   . Pharmacist Clinical Goal(s): o Over the next 90 days, patient will work with PharmD and providers to maintain LDL goal < 70 . Current regimen:  o Crestor 5mg daily . Interventions: o None . Patient self care activities - Over the next 90 days, patient will: o Continue rosuvastatin 5mg daily  COPD . Pharmacist Clinical Goal(s) o Over the next 90 days, patient will work with PharmD and providers to improve breathing . Current regimen:  o Rare use of Symbicort, Flonase, Singulair . Interventions: o Consider daily use of maintenance medications . Patient self care activities - Over the next 90 days, patient will: o Take montelukast daily  Medication management . Pharmacist Clinical Goal(s): o Over the next 90 days, patient will work with PharmD and providers to maintain optimal medication adherence . [facility] . Interventions o Comprehensive medication review performed. o Continue current medication management strategy . Patient self care activities - Over the next 90 days, patient will: o Focus on medication adherence by taking COPD medications routinely o Take medications as prescribed o Report any questions or concerns to PharmD and/or provider(s)      AFIB   Patient is currently rate controlled.  Patient has failed these meds in past: n/a Patient is currently controlled on the following medications:  . Eliquis 5 mg twice daily  . Metoprolol XL 50 mg twice daily   We discussed:  PAP done by [facility] and approved through 2021. Encouraged patient's daughter to re-apply for 2022 calendar year and to inform me if I can be of assistance.   Patient's daughter states she has complaints of fatigue and lethargy. May be worthwhile seeing if beta-blocker could be decreased to improve  symptoms, but will address at next visit.   Plan  Continue current medications  Hyperlipidemia   LDL goal < 70  Lipid Panel     Component Value Date/Time   CHOL 132 02/16/2019 1530   TRIG 65 02/16/2019 1530   HDL 57 02/16/2019 1530   LDLCALC 62 02/16/2019 1530    Hepatic Function Latest Ref Rng & Units 02/16/2019 10/21/2017 06/23/2017  Total Protein 6.0 - 8.5 g/dL 6.7 7.0 7.1  Albumin 3.7 - 4.7 g/dL 4.3 - 4.1  AST 0 - 40 IU/L 19 19 24  ALT 0 - 32 IU/L 17 15 18  Alk Phosphatase 39 - 117 IU/L 103 - 74  Total Bilirubin 0.0 - 1.2 mg/dL 0.6 0.6 0.6  Bilirubin, Direct 0.1 - 0.5 mg/dL - - 0.1     The ASCVD Risk score (Goff DC Jr., et al., 2013) failed to calculate for the following reasons:   The patient has a prior MI or stroke diagnosis   Patient has failed these meds in past: None Patient is currently controlled on the following medications:  . Crestor 5mg daily  We discussed: At goal Denies myalgias, although difficult to distinguish in context of severe   arthritis  Plan  Continue current medications  Hypertension   BP goal is:  <140/90  Office blood pressures are  BP Readings from Last 3 Encounters:  10/11/20 (!) 138/45  09/12/20 98/62  06/06/20 114/72   Patient checks BP at home daily Patient home BP readings are ranging: NA  Patient has failed these meds in the past: NA Patient is currently controlled on the following medications:  . Entresto 24/25mg bid . Toprol XL 50mg bid  We discussed: Rare use of Lasix, not used in months  Plan  Continue current medications   Psoriatic and osteoarthritis    Patient has failed these meds in past: NA Patient is currently controlled on the following medications:   Otezla 30 mg 2 tablets daily   Tramadol 50 mg PRN   Gabapentin 300 mg 2 tablets AM, HS, and 1 tab with dinner PRN.   We discussed: Gabapentin since 09/2018 Otezla 60mg daily, effective Tramadol about weekly Pain now/typical: 7 Worst: 10  We  discussed: Patient never picked up Lyrica due to needing a prior authorization. Patient and daughter hesitant of retrying to get Lyrica approved and would prefer to stay with gabapentin for now.   Plan  Continue current medications   COPD / Asthma / Tobacco    Eosinophil count:   Lab Results  Component Value Date/Time   EOSPCT 3 10/10/2018 07:25 PM   EOSPCT 4.3 05/11/2013 03:47 PM  %                               Eos (Absolute):  Lab Results  Component Value Date/Time   EOSABS 0.3 05/30/2020 03:18 PM   EOSABS 0.3 05/11/2013 03:47 PM    Tobacco Status:  Social History   Tobacco Use  Smoking Status Former Smoker  . Packs/day: 0.50  . Years: 35.00  . Pack years: 17.50  . Types: Cigarettes  Smokeless Tobacco Never Used  Tobacco Comment   Quit approx 1990    Patient has failed these meds in past: NA Patient is currently controlled on the following medications: Symbicort, Flonase, Singulair  Using maintenance inhaler regularly? No Frequency of rescue inhaler use:  never  We discussed: Symbicort as needed, counseled Rare use of Flonase Singulair about once monthly, counseled  Plan  Continue current medications  Medication Management   [facility] Uses pill box? Yes, daughter fills for her Pt endorses 100% compliance  Plan  Continue current medication management strategy  Follow up: 3 month phone visit  Alex ,PharmD Clinical Pharmacist [facility] [phone] 

## 2020-11-12 ENCOUNTER — Other Ambulatory Visit: Payer: Self-pay | Admitting: Family Medicine

## 2020-11-12 NOTE — Telephone Encounter (Signed)
Requested medication (s) are due for refill today: no  Requested medication (s) are on the active medication list: yes  Last refill: 09/11/2020  Future visit scheduled: yes  Notes to clinic:  looks like medication was filled by historical provider    Requested Prescriptions  Pending Prescriptions Disp Refills   gabapentin (NEURONTIN) 300 MG capsule [Pharmacy Med Name: GABAPENTIN 300 MG CAPSULE] 180 capsule 1    Sig: TAKE 2 CAPSULES (600 MG TOTAL) BY MOUTH 3 (THREE) TIMES DAILY.      Neurology: Anticonvulsants - gabapentin Passed - 11/12/2020  9:02 AM      Passed - Valid encounter within last 12 months    Recent Outpatient Visits           1 month ago Dog bite of right lower leg with infection, initial encounter   [facility] Flinchum, Kelby Aline, FNP   2 months ago Encounter for annual physical exam   [facility] Jerrol Banana., MD   5 months ago Neuropathy   [facility] Jerrol Banana., MD   6 months ago Neuropathy   [facility] Jerrol Banana., MD   1 year ago Arthropathy of right shoulder   [facility] Jerrol Banana., MD       Future Appointments             In 2 weeks Jerrol Banana., MD [facility], [place]

## 2020-11-13 ENCOUNTER — Ambulatory Visit: Payer: Self-pay | Admitting: Family Medicine

## 2020-11-23 NOTE — Progress Notes (Signed)
Established patient visit   Patient: Teresa [name]   DOB: 08/22/1941   79 y.o. Female  MRN: [id number] Visit Date: 11/26/2020  Today's healthcare provider: [name], MD   Chief Complaint  Patient presents with  . Hyperglycemia   Subjective    HPI  Patient is in today for follow-up.  She has chronic back pain and chronic neuropathic pain.  We sent in Lyrica but it never went through on insurance. She is presently having pain in the right hip region especially with weightbearing.  She had total left hip replacement in the past by Dr. Marry Guan. Another issue today is chronic rhinorrhea.  During the past year she is also lost her sense of taste and smell to a large degree. She also needs lab work done for her heart disease hypertension hyperlipidemia and some fatigue. Pre diabetes, follow-up  Lab Results  Component Value Date   HGBA1C 5.8 (A) 11/26/2020   HGBA1C 5.8 (H) 05/30/2020   Last seen for diabetes 5 months ago.  Management since then includes continuing the same treatment. She reports good compliance with treatment. She is not having side effects.   Home blood sugar records: trend: stable  Episodes of hypoglycemia? No    Current insulin regiment: none Most Recent Eye Exam: due  Neuropathy  From 09/12/2020-Changed gabapentin to Lyrica. Patient reports that her symptoms haven't changed much.        Medications: Outpatient Medications Prior to Visit  Medication Sig  . acetaminophen (TYLENOL) 650 MG CR tablet in the morning and at bedtime.   Marland Kitchen apixaban (ELIQUIS) 5 MG TABS tablet Take 1 tablet (5 mg total) by mouth 2 (two) times daily.  . budesonide-formoterol (SYMBICORT) 160-4.5 MCG/ACT inhaler Inhale 2 puffs into the lungs 2 (two) times daily.  . Cholecalciferol 25 MCG (1000 UT) capsule Take 1,000 Units by mouth daily.   Marland Kitchen donepezil (ARICEPT) 5 MG tablet Take 1 tablet (5 mg total) by mouth at bedtime.  Marland Kitchen ENTRESTO 24-26 MG Take 1 tablet by mouth 2  (two) times daily.  . fluticasone (FLONASE) 50 MCG/ACT nasal spray Place 2 sprays into both nostrils daily as needed for allergies.   . furosemide (LASIX) 20 MG tablet TAKE 1 TABLET (20 MG TOTAL) BY MOUTH DAILY AS NEEDED (SHORTNESS OF BREATH).  Marland Kitchen gabapentin (NEURONTIN) 300 MG capsule Take two tablets by mouth in the AM, two tablets by mouth at bedtime, and 1 tablet at dinner as needed.  . metoprolol succinate (TOPROL-XL) 50 MG 24 hr tablet TAKE 1 TABLET (50 MG TOTAL) BY MOUTH 2 (TWO) TIMES DAILY  . montelukast (SINGULAIR) 10 MG tablet Take 10 mg by mouth daily as needed (for allergies).   . Multiple Vitamins-Minerals (MULTIVITAMIN PO) Take 1 tablet by mouth daily.  Marland Kitchen OTEZLA 30 MG TABS Take 2 tablets by mouth daily.  . rosuvastatin (CRESTOR) 5 MG tablet TAKE 1 TABLET BY MOUTH EVERY DAY  . traMADol (ULTRAM) 50 MG tablet ONE TAB AS NEEDED FOR JOINT PAIN  . venlafaxine (EFFEXOR) 75 MG tablet TAKE 2 TABLETS (150 MG TOTAL) BY MOUTH DAILY.   No facility-administered medications prior to visit.    Review of Systems  Constitutional: Negative.   Respiratory: Negative.   Cardiovascular: Negative.   Neurological: Negative for dizziness and headaches.    Last hemoglobin A1c Lab Results  Component Value Date   HGBA1C 5.8 (A) 11/26/2020      Objective    BP (!) 122/52   Pulse Marland Kitchen)  58   Temp 98.6 F (37 C)   Resp 16   Wt 171 lb (77.6 kg)   BMI 29.35 kg/m  BP Readings from Last 3 Encounters:  11/26/20 (!) 122/52  10/11/20 (!) 138/45  09/12/20 98/62   Wt Readings from Last 3 Encounters:  11/26/20 171 lb (77.6 kg)  10/11/20 176 lb 12.8 oz (80.2 kg)  09/12/20 171 lb (77.6 kg)      Physical Exam Vitals reviewed.  Constitutional:      Appearance: Normal appearance. She is well-developed.  HENT:     Head: Normocephalic and atraumatic.     Right Ear: External ear normal.     Left Ear: External ear normal.     Nose: Nose normal.     Mouth/Throat:     Pharynx: Oropharynx is clear.   Eyes:     General: No scleral icterus.    Conjunctiva/sclera: Conjunctivae normal.  Neck:     Thyroid: No thyromegaly.  Cardiovascular:     Rate and Rhythm: Normal rate and regular rhythm.     Heart sounds: Normal heart sounds.  Pulmonary:     Effort: Pulmonary effort is normal.     Breath sounds: Normal breath sounds.  Chest:  Breasts:     Right: Normal.     Left: Normal.    Abdominal:     Palpations: Abdomen is soft.  Musculoskeletal:     Comments: She has pain with internal and external rotation of her right hip.  Skin:    General: Skin is warm and dry.  Neurological:     General: No focal deficit present.     Mental Status: She is alert and oriented to person, place, and time. Mental status is at baseline.  Psychiatric:        Mood and Affect: Mood normal.        Behavior: Behavior normal.        Thought Content: Thought content normal.        Judgment: Judgment normal.       Results for orders placed or performed in visit on 11/26/20  POCT glycosylated hemoglobin (Hb A1C)  Result Value Ref Range   Hemoglobin A1C 5.8 (A) 4.0 - 5.6 %   HbA1c POC (<> result, manual entry)     HbA1c, POC (prediabetic range)     HbA1c, POC (controlled diabetic range)      Assessment & Plan     1. Hyperglycemia Goal A1c less than 6.5-7. - POCT glycosylated hemoglobin (Hb A1C)  2. Hyperlipidemia, unspecified hyperlipidemia type LDL goal less than 70 - Lipid panel  3. Atrial fibrillation with RVR (HCC) On Eliquis  4. 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 Right hip pain I truly think her knee pain is from her right hip as opposed to her back.  Pain x-rays and refer back to Dr. Marry Guan - DG Hip Unilat W OR W/O Pelvis 2-3 Views Right; Future  6. Fatigue, unspecified type  - CBC with Differential/Platelet - TSH  7. Allergic rhinitis, unspecified  seasonality, unspecified trigger Treat rhinorrhea as allergies at this time. - cetirizine (ZYRTEC) 10 MG tablet; Take 1 tablet (10 mg total) by mouth daily.  Dispense: 30 tablet; Refill: 11 - fluticasone (FLONASE) 50 MCG/ACT nasal spray; Place 2 sprays into both nostrils daily as needed for allergies.  Dispense: 16 g; Refill: 12 8.  MCI On donepezil.  No follow-ups on file.         Khamarion Bjelland Cranford Mon, MD  [facility] [phone] (phone) [phone] (fax)  [place]

## 2020-11-26 ENCOUNTER — Ambulatory Visit
Admission: RE | Admit: 2020-11-26 | Discharge: 2020-11-26 | Disposition: A | Discharge status: Home / Self Care | Payer: Medicare HMO | Attending: Family Medicine | Admitting: Family Medicine

## 2020-11-26 ENCOUNTER — Other Ambulatory Visit: Payer: Self-pay

## 2020-11-26 ENCOUNTER — Ambulatory Visit (INDEPENDENT_AMBULATORY_CARE_PROVIDER_SITE_OTHER): Payer: Medicare HMO | Admitting: Family Medicine

## 2020-11-26 ENCOUNTER — Ambulatory Visit
Admission: RE | Admit: 2020-11-26 | Discharge: 2020-11-26 | Disposition: A | Discharge status: Home / Self Care | Payer: Medicare HMO | Source: Ambulatory Visit | Attending: Family Medicine | Admitting: Family Medicine

## 2020-11-26 ENCOUNTER — Encounter: Payer: Self-pay | Admitting: Family Medicine

## 2020-11-26 VITALS — BP 122/52 | HR 58 | Temp 98.6°F | Resp 16 | Wt 171.0 lb

## 2020-11-26 DIAGNOSIS — I25728 Atherosclerosis of autologous artery coronary artery bypass graft(s) with other forms of angina pectoris: Secondary | ICD-10-CM

## 2020-11-26 DIAGNOSIS — M1611 Unilateral primary osteoarthritis, right hip: Secondary | ICD-10-CM | POA: Diagnosis not present

## 2020-11-26 DIAGNOSIS — I4891 Unspecified atrial fibrillation: Secondary | ICD-10-CM | POA: Diagnosis not present

## 2020-11-26 DIAGNOSIS — M25551 Pain in right hip: Secondary | ICD-10-CM

## 2020-11-26 DIAGNOSIS — J309 Allergic rhinitis, unspecified: Secondary | ICD-10-CM

## 2020-11-26 DIAGNOSIS — Z951 Presence of aortocoronary bypass graft: Secondary | ICD-10-CM

## 2020-11-26 DIAGNOSIS — R739 Hyperglycemia, unspecified: Secondary | ICD-10-CM

## 2020-11-26 DIAGNOSIS — E785 Hyperlipidemia, unspecified: Secondary | ICD-10-CM

## 2020-11-26 DIAGNOSIS — R5383 Other fatigue: Secondary | ICD-10-CM

## 2020-11-26 LAB — POCT GLYCOSYLATED HEMOGLOBIN (HGB A1C): Hemoglobin A1C: 5.8 % — AB (ref 4.0–5.6)

## 2020-11-26 MED ORDER — FLUTICASONE PROPIONATE 50 MCG/ACT NA SUSP: 2.0000 | Freq: Every day | NASAL | 12 refills | Status: DC | PRN

## 2020-11-26 MED ORDER — CETIRIZINE HCL 10 MG PO TABS: 10.0000 mg | ORAL_TABLET | Freq: Every day | ORAL | 11 refills | Status: DC

## 2020-12-05 DIAGNOSIS — Z79899 Other long term (current) drug therapy: Secondary | ICD-10-CM | POA: Diagnosis not present

## 2020-12-05 DIAGNOSIS — M8949 Other hypertrophic osteoarthropathy, multiple sites: Secondary | ICD-10-CM | POA: Diagnosis not present

## 2020-12-05 DIAGNOSIS — M1611 Unilateral primary osteoarthritis, right hip: Secondary | ICD-10-CM | POA: Diagnosis not present

## 2020-12-05 DIAGNOSIS — L4050 Arthropathic psoriasis, unspecified: Secondary | ICD-10-CM | POA: Diagnosis not present

## 2020-12-05 DIAGNOSIS — L409 Psoriasis, unspecified: Secondary | ICD-10-CM | POA: Diagnosis not present

## 2020-12-07 ENCOUNTER — Other Ambulatory Visit: Payer: Self-pay | Admitting: Family Medicine

## 2020-12-07 MED ORDER — DONEPEZIL HCL 5 MG PO TABS: 5.0000 mg | ORAL_TABLET | Freq: Every day | ORAL | 1 refills | Status: DC

## 2020-12-07 NOTE — Telephone Encounter (Signed)
Medication Refill - Medication: donepezil (ARICEPT) 5 MG tablet  Patient states that she is all out of the medication     Preferred Pharmacy (with phone number or street name):  [id number] [place], [place] Phone:  [phone]  Fax:  [phone]       Agent: Please be advised that RX refills may take up to 3 business days. We ask that you follow-up with your pharmacy.

## 2020-12-10 DIAGNOSIS — M25551 Pain in right hip: Secondary | ICD-10-CM | POA: Diagnosis not present

## 2020-12-10 DIAGNOSIS — W010XXA Fall on same level from slipping, tripping and stumbling without subsequent striking against object, initial encounter: Secondary | ICD-10-CM | POA: Diagnosis not present

## 2020-12-10 DIAGNOSIS — M1611 Unilateral primary osteoarthritis, right hip: Secondary | ICD-10-CM | POA: Diagnosis not present

## 2021-01-02 ENCOUNTER — Telehealth: Payer: Self-pay

## 2021-01-02 NOTE — Chronic Care Management (AMB) (Signed)
Chronic Care Management Pharmacy Assistant   Name: Teresa [name]  MRN: [id number] DOB: [birth year]  Reason for Encounter: Disease State - General Adherence Call.   Patient Questions:    1.  Have you seen any other providers since your last visit?  Yes. 11/26/2020 Teresa [name] (Family Medicine) 12/05/2020 Teresa Posey Pronto, MD [place].    2.  Any changes in your medicines or health? No.   .  PCP : Teresa [name]., MD  Allergies:   Allergies  Allergen Reactions  . Sulfa Antibiotics Hives  . Ivp Dye [Iodinated Diagnostic Agents] Swelling  . Amiodarone Nausea And Vomiting and Cough  . Codeine Other (See Comments)    ALTERED MENTAL STATUS    Medications: Outpatient Encounter Medications as of 01/02/2021  Medication Sig  . acetaminophen (TYLENOL) 650 MG CR tablet in the morning and at bedtime.   Marland Kitchen apixaban (ELIQUIS) 5 MG TABS tablet Take 1 tablet (5 mg total) by mouth 2 (two) times daily.  . budesonide-formoterol (SYMBICORT) 160-4.5 MCG/ACT inhaler Inhale 2 puffs into the lungs 2 (two) times daily.  . cetirizine (ZYRTEC) 10 MG tablet Take 1 tablet (10 mg total) by mouth daily.  . Cholecalciferol 25 MCG (1000 UT) capsule Take 1,000 Units by mouth daily.   Marland Kitchen donepezil (ARICEPT) 5 MG tablet Take 1 tablet (5 mg total) by mouth at bedtime.  Marland Kitchen ENTRESTO 24-26 MG Take 1 tablet by mouth 2 (two) times daily.  . fluticasone (FLONASE) 50 MCG/ACT nasal spray Place 2 sprays into both nostrils daily as needed for allergies.  . furosemide (LASIX) 20 MG tablet TAKE 1 TABLET (20 MG TOTAL) BY MOUTH DAILY AS NEEDED (SHORTNESS OF BREATH).  Marland Kitchen gabapentin (NEURONTIN) 300 MG capsule Take two tablets by mouth in the AM, two tablets by mouth at bedtime, and 1 tablet at dinner as needed.  . metoprolol succinate (TOPROL-XL) 50 MG 24 hr tablet TAKE 1 TABLET (50 MG TOTAL) BY MOUTH 2 (TWO) TIMES DAILY  . montelukast (SINGULAIR) 10 MG tablet Take 10 mg by mouth daily as needed  (for allergies).   . Multiple Vitamins-Minerals (MULTIVITAMIN PO) Take 1 tablet by mouth daily.  Marland Kitchen OTEZLA 30 MG TABS Take 2 tablets by mouth daily.  . rosuvastatin (CRESTOR) 5 MG tablet TAKE 1 TABLET BY MOUTH EVERY DAY  . traMADol (ULTRAM) 50 MG tablet ONE TAB AS NEEDED FOR JOINT PAIN  . venlafaxine (EFFEXOR) 75 MG tablet TAKE 2 TABLETS (150 MG TOTAL) BY MOUTH DAILY.   No facility-administered encounter medications on file as of 01/02/2021.    Current Diagnosis: Patient Active Problem List   Diagnosis Date Noted  . Need for prophylactic vaccination against rabies 10/11/2020  . Puncture wound 10/11/2020  . Need for Tdap vaccination 10/11/2020  . Dog bite of right lower leg with infection 10/11/2020  . Neuropathy 05/02/2020  . Chronic obstructive pulmonary disease ([place]) 03/11/2019  . Atrial fibrillation ([place]) 11/11/2018  . A-fib ([place]) 10/12/2018  . Rheumatoid arthritis ([place]) 10/10/2018  . Atrial fibrillation with RVR ([place]) 10/10/2018  . Psoriatic arthritis ([place]) 03/04/2018  . Unstable angina ([place])   . Atrial fibrillation with rapid ventricular response ([place]) 01/13/2018  . 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 Asthma 05/09/2017  . 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 Arthralgia of both hands 01/31/2016  . Ventricular premature depolarization 09/11/2015  . Psoriasis 07/16/2015  . 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 Hyperlipidemia 03/29/2015  . Pre-syncope 03/04/2015  .  CAD (coronary artery disease)   . PVC's (premature ventricular contractions)   . Ischemic cardiomyopathy   . DDD (degenerative disc disease), lumbar 01/18/2015  . Intervertebral disc disorder with radiculopathy of lumbar region 12/26/2014  . L-S radiculopathy 07/03/2014  . Breast CA ([place]) 06/28/2014  . Arthritis, degenerative 06/28/2014  . Acid reflux 03/08/2014  . 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 Patient states she has been doing good, she is taking her medications as directed by providers, states she has not been needing her Furosemide - no edema. Patient states she does not take blood pressure at home. Patient has been watching her salt intake and eating healthy, states she stays away from fats , red meats, fried foods. Patient states she does not have a exercise regimen, but she keeps busy, states she has two dogs that keep her busy.  Daron Offer, CPP Notified  Judithann Sheen, [facility] Clinical Pharmacist Assistant [phone]

## 2021-01-09 NOTE — Progress Notes (Signed)
Subjective:   Teresa [name] is a 80 y.o. female who presents for Medicare Annual (Subsequent) preventive examination.  I connected with Denyla Tam today by telephone and verified that I am speaking with the correct person using two identifiers. Location patient: home Location provider: work Persons participating in the virtual visit: patient, provider.   I discussed the limitations, risks, security and privacy concerns of performing an evaluation and management service by telephone and the availability of in person appointments. I also discussed with the patient that there may be a patient responsible charge related to this service. The patient expressed understanding and verbally consented to this telephonic visit.    Interactive audio and video telecommunications were attempted between this provider and patient, however failed, due to patient having technical difficulties OR patient did not have access to video capability.  We continued and completed visit with audio only.  Review of Systems    N/A  Cardiac Risk Factors include: advanced age (>73men, >68 women);dyslipidemia;hypertension     Objective:    There were no vitals filed for this visit. There is no height or weight on file to calculate BMI.  Advanced Directives 01/10/2021 01/10/2020 01/05/2019 11/11/2018 10/29/2018 10/10/2018 10/10/2018  Does Patient Have a Medical Advance Directive? Yes Yes Yes Yes Yes Yes Yes  Type of Paramedic of [place];Living will [place];Living will Living will;Healthcare Power of [place];Living will [place];Living will [place];Living will [place];Living will  Does patient want to make changes to medical advance directive? - - - No - Patient declined No - Patient declined No - Patient declined -  Copy of [place] in Chart? Yes - validated most  recent copy scanned in chart (See row information) Yes - validated most recent copy scanned in chart (See row information) No - copy requested No - copy requested Yes - validated most recent copy scanned in chart (See row information) No - copy requested -    Current Medications (verified) Outpatient Encounter Medications as of 01/10/2021  Medication Sig  . acetaminophen (TYLENOL) 650 MG CR tablet in the morning and at bedtime.   Marland Kitchen apixaban (ELIQUIS) 5 MG TABS tablet Take 1 tablet (5 mg total) by mouth 2 (two) times daily.  . budesonide-formoterol (SYMBICORT) 160-4.5 MCG/ACT inhaler Inhale 2 puffs into the lungs 2 (two) times daily.  . cetirizine (ZYRTEC) 10 MG tablet Take 1 tablet (10 mg total) by mouth daily.  . Cholecalciferol 25 MCG (1000 UT) capsule Take 1,000 Units by mouth daily.   Marland Kitchen donepezil (ARICEPT) 5 MG tablet Take 1 tablet (5 mg total) by mouth at bedtime.  Marland Kitchen ENTRESTO 24-26 MG Take 1 tablet by mouth 2 (two) times daily.  . fluticasone (FLONASE) 50 MCG/ACT nasal spray Place 2 sprays into both nostrils daily as needed for allergies.  . furosemide (LASIX) 20 MG tablet TAKE 1 TABLET (20 MG TOTAL) BY MOUTH DAILY AS NEEDED (SHORTNESS OF BREATH).  Marland Kitchen gabapentin (NEURONTIN) 300 MG capsule Take two tablets by mouth in the AM, two tablets by mouth at bedtime, and 1 tablet at dinner as needed.  . metoprolol succinate (TOPROL-XL) 50 MG 24 hr tablet TAKE 1 TABLET (50 MG TOTAL) BY MOUTH 2 (TWO) TIMES DAILY  . montelukast (SINGULAIR) 10 MG tablet Take 10 mg by mouth daily as needed (for allergies).   . Multiple Vitamins-Minerals (MULTIVITAMIN PO) Take 1 tablet by mouth daily.  Marland Kitchen  OTEZLA 30 MG TABS Take 2 tablets by mouth daily.  . rosuvastatin (CRESTOR) 5 MG tablet TAKE 1 TABLET BY MOUTH EVERY DAY  . traMADol (ULTRAM) 50 MG tablet ONE TAB AS NEEDED FOR JOINT PAIN  . venlafaxine (EFFEXOR) 75 MG tablet TAKE 2 TABLETS (150 MG TOTAL) BY MOUTH DAILY.   No facility-administered encounter medications on  file as of 01/10/2021.    Allergies (verified) Sulfa antibiotics, Ivp dye [iodinated diagnostic agents], Amiodarone, and Codeine   History: Past Medical History:  Diagnosis Date  . Arthritis   . Asthma   . Back pain   . Breast cancer ([place]) 2009   left breast   . CAD (coronary artery disease)    a. 1994 s/p CABG x 1 (LIMA->LAD); b. 03/2015 MV: No ischemia; c. MV 11/18: small fixed apical defect likely secondary to breast attenuation, EF of 42%, frequent PVCs; d. 12/2017 Cath: LM nl, LAD 20p, D1/2/3 nl, LCX min irregs, OM1/2/3 min irregs, RCA nl, RPDA nl, RPL1/2 nl, LIMA->LAD atretic.  Marland Kitchen Chronic combined systolic (congestive) and diastolic (congestive) heart failure ([place])    a. 2013 EF 40%;  b. 03/2015 Echo: EF 55-60%; c. 12/18 Echo: EF of 35-40%; d. 12/2017 TEE: EF 35-40%; e. 03/2018 Echo: EF 40-45%; f. 09/2018 Echo: EF 35%.  . Dental crowns present    caps- left back top, right back bottom  . Gastroesophageal reflux disease   . Hyperlipidemia   . Hypertension   . MI (myocardial infarction) ([place]) 1994  . Mixed Ischemic & Nonischemic cardiomyopathy    a. 2013 EF 40%;  b. 03/2015 Echo: EF 55-60%; c. 12/18 Echo: EF of 35-40%, hypokinesis of the anteroseptal, and apical myocardium, mild to mod MR, mod dil LA, nl RV fxn, PASP 53 mmHg; d. 12/2017 TEE: EF 35-40%, diff HK, mild to mod MR. small PFO. No LAA/RAA thrombus; e. 03/2018 Echo: EF 40-45%, antsept/inf HK, Gr2 DD, mild MR, mod idl LA, mild to mod TR, PASP 35-39mmHg; f. 09/2018 Echo: EF 35%.  . Persistent atrial fibrillation ([place])    a. diagnosed 01/13/2018; b. CHADS2VASc = 6 --> Eliquis; c. 12/2017 s/p TEE/DCCV. Amio started but d/c'd 01/2018 2/2 n/anorexia; d. 10/2018 DCCV-->recurrent Afib w/in days; e. 10/2018 DCCV x 2 in ED->persistent Afib.  . Personal history of radiation therapy   . Psoriasis   . PSVT (paroxysmal supraventricular tachycardia) ([place])    a. 02/2015 Holter: short runs of SVT and NSVT.  Marland Kitchen Pulmonary hypertension ([place] [facility])   . PVC's  (premature ventricular contractions)    a. 03/2018 24h Holter: Freq PVC's with a total of 421 beats in 24 hrs. 7 short runs of SVT likely representing Afib.  . Rheumatoid arthritis ([facility])    feet, hands  . Vertigo    approx 2x/yr   Past Surgical History:  Procedure Laterality Date  . ABDOMINAL HYSTERECTOMY    . back fusion    . BREAST BIOPSY Right    1991 negative  . BREAST EXCISIONAL BIOPSY Left    2009 positive  . BREAST LUMPECTOMY Left 2009  . [place] N/A 01/15/2018   Procedure: CARDIOVERSION;  Surgeon: Wellington Hampshire, MD;  Location: [facility];  Service: Cardiovascular;  Laterality: N/A;  . CARDIOVERSION N/A 10/29/2018   Procedure: CARDIOVERSION;  Surgeon: Minna Merritts, MD;  Location: [facility];  Service: Cardiovascular;  Laterality: N/A;  . CHOLECYSTECTOMY    . CORONARY ARTERY  BYPASS GRAFT  1994   1 vessel - [facility]  . FOOT ARTHRODESIS Right 07/24/2016   Procedure: FUSION FIRST METATARSAL CUNEIFORM JOINT RIGHT FOOT, FUSION SECOND METATARSAL CUNEFORM JOINT BUNION REPAIR RIGHT FOOT;  Surgeon: Albertine Patricia, DPM;  Location: [facility];  Service: Podiatry;  Laterality: Right;  . FOOT FRACTURE SURGERY    . HARDWARE REMOVAL Right 07/24/2016   Procedure: REMOVAL HARDWARE LATERAL MALLEOUS RIGHT ANKLE;  Surgeon: Albertine Patricia, DPM;  Location: [facility];  Service: Podiatry;  Laterality: Right;  REMOVAL OF PIN WHICH WAS INTACT  . HERNIA REPAIR    . NECK SURGERY    . RIGHT/LEFT HEART CATH AND CORONARY/GRAFT ANGIOGRAPHY N/A 01/14/2018   Procedure: LEFT HEART CATH AND CORONARY ANGIOGRAPHY;  Surgeon: Wellington Hampshire, MD;  Location: [facility];  Service: Cardiovascular;  Laterality: N/A;  . TEE WITHOUT CARDIOVERSION N/A 01/15/2018   Procedure: TRANSESOPHAGEAL ECHOCARDIOGRAM (TEE);  Surgeon: Wellington Hampshire, MD;  Location: [facility];  Service: Cardiovascular;  Laterality: N/A;  . TOTAL HIP  ARTHROPLASTY Left   . TOTAL KNEE ARTHROPLASTY Left    Family History  Problem Relation Age of Onset  . Heart attack Father   . Congestive Heart Failure Mother   . Breast cancer Mother 68  . Kidney failure Sister   . Prostate cancer Brother   . Bladder Cancer Brother   . Lung cancer Sister   . Arthritis Sister   . Breast cancer Sister   . Arthritis Sister   . Breast cancer Sister   . Lung cancer Sister   . Lung cancer Brother   . Arthritis Brother    Social History   Socioeconomic History  . Marital status: Divorced    Spouse name: Not on file  . Number of children: 2  . Years of education: Not on file  . Highest education level: 11th grade  Occupational History  . Occupation: retired  Tobacco Use  . Smoking status: Former Smoker    Packs/day: 0.50    Years: 35.00    Pack years: 17.50    Types: Cigarettes  . Smokeless tobacco: Never Used  . Tobacco comment: Quit approx 1990  Vaping Use  . Vaping Use: Never used  Substance and Sexual Activity  . Alcohol use: No  . Drug use: No  . Sexual activity: Not on file  Other Topics Concern  . Not on file  Social History Narrative  . Not on file   Social Determinants of Health   Financial Resource Strain: Low Risk   . Difficulty of Paying Living Expenses: Not hard at all  Food Insecurity: No Food Insecurity  . Worried About Charity fundraiser in the Last Year: Never true  . Ran Out of Food in the Last Year: Never true  Transportation Needs: No Transportation Needs  . Lack of Transportation (Medical): No  . Lack of Transportation (Non-Medical): No  Physical Activity: Inactive  . Days of Exercise per Week: 0 days  . Minutes of Exercise per Session: 0 min  Stress: No Stress Concern Present  . Feeling of Stress : Not at all  Social Connections: Moderately Isolated  . Frequency of Communication with Friends and Family: More than three times a week  . Frequency of Social Gatherings with Friends and Family: More than  three times a week  . Attends Religious Services: More than 4 times per year  . Active Member of Clubs or Organizations: No  . Attends Archivist Meetings:  Never  . Marital Status: Divorced    Tobacco Counseling Counseling given: Not Answered Comment: Quit approx 1990   Clinical Intake:  Pre-visit preparation completed: Yes  Pain : No/denies pain     Nutritional Risks: None Diabetes: No  How often do you need to have someone help you when you read instructions, pamphlets, or other written materials from your doctor or pharmacy?: 1 - Never  Diabetic? No  Interpreter Needed?: No  Information entered by :: [facility], LPN   Activities of Daily Living In your present state of health, do you have any difficulty performing the following activities: 01/10/2021  Hearing? N  Vision? N  Difficulty concentrating or making decisions? Y  Comment Currently on Aricept.  Walking or climbing stairs? N  Dressing or bathing? N  Doing errands, shopping? N  Preparing Food and eating ? N  Using the Toilet? N  In the past six months, have you accidently leaked urine? N  Do you have problems with loss of bowel control? N  Managing your Medications? N  Managing your Finances? N  Housekeeping or managing your Housekeeping? N  Some recent data might be hidden    Patient Care Team: Jerrol Banana., MD as PCP - General (Family Medicine) Wellington Hampshire, MD as PCP - Cardiology (Cardiology) Dasher, Rayvon Char, MD as Consulting Physician (Dermatology) Blanche East, MD as Consulting Physician (Neurosurgery) Estevan Ryder, MD as Referring Physician (Cardiology) Troxler, Adele Schilder as Attending Physician (Podiatry) Germaine Pomfret, [facility] as Pharmacist (Pharmacist) Quintin Alto, MD as Consulting Physician (Rheumatology) Diamond Nickel, DO as Consulting Physician (Sports Medicine) Marchia Meiers, MD as Consulting Physician (Ophthalmology)  Indicate any recent  Medical Services you may have received from other than [facility] providers in the past year (date may be approximate).     Assessment:   This is a routine wellness examination for Teresa [name].  Hearing/Vision screen No exam data present  Dietary issues and exercise activities discussed: Current Exercise Habits: The patient does not participate in regular exercise at present, Exercise limited by: orthopedic condition(s)  Goals    . Chronic Care Management     CARE PLAN ENTRY (see longitudinal plan of care for additional care plan information)  Current Barriers:  . Chronic Disease Management support, education, and care coordination needs related to Hypertension, Hyperlipidemia, Atrial Fibrillation, Heart Failure, Coronary Artery Disease, COPD, Chronic Kidney Disease, and Chronic Pain   Hypertension BP Readings from Last 3 Encounters:  06/06/20 114/72  05/02/20 123/73  07/25/19 126/72   . Pharmacist Clinical Goal(s): o Over the next 90 days, patient will work with PharmD and providers to maintain BP goal <140/90 . Current regimen:  o Entresto 24/26mg  twice daily o Metoprolol succinate 50mg  twice daily . Interventions: o None . Patient self care activities - Over the next 90 days, patient will: o Check BP weekly, document, and provide at future appointments o Ensure daily salt intake < 2300 mg/day  Hyperlipidemia Lab Results  Component Value Date/Time   LDLCALC 62 02/16/2019 03:30 PM   . Pharmacist Clinical Goal(s): o Over the next 90 days, patient will work with PharmD and providers to maintain LDL goal < 70 . Current regimen:  o Crestor 5mg  daily . Interventions: o None . Patient self care activities - Over the next 90 days, patient will: o Continue rosuvastatin 5mg  daily  COPD . Pharmacist Clinical Goal(s) o Over the next 90 days, patient will work with PharmD and providers to improve breathing .  Current regimen:  o Rare use of Symbicort, Flonase,  Singulair . Interventions: o Consider daily use of maintenance medications . Patient self care activities - Over the next 90 days, patient will: o Take montelukast daily  Medication management . Pharmacist Clinical Goal(s): o Over the next 90 days, patient will work with PharmD and providers to maintain optimal medication adherence . Current pharmacy: [facility] . Interventions o Comprehensive medication review performed. o Continue current medication management strategy . Patient self care activities - Over the next 90 days, patient will: o Focus on medication adherence by taking COPD medications routinely o Take medications as prescribed o Report any questions or concerns to PharmD and/or provider(s)    . Exercise 3x per week (30 min per time)     Recommend to exercise 3 times a week for at least 30 minutes at a time.     . Increase water intake     Recommend to increase fluid intake from 2 - 16 oz water bottles every day to 4 - 16 oz water bottles every day  01/05/19- Recommend to drink at least 6-8 8oz glasses of water per day.    . Prevent falls     Recommend to remove any items from the home that may cause slips or trips.      Depression Screen PHQ 2/9 Scores 01/10/2021 01/10/2020 01/10/2020 01/05/2019 01/05/2019 10/22/2017 09/16/2017  PHQ - 2 Score 0 0 0 0 0 1 0  PHQ- 9 Score - - - 4 - 7 -    Fall Risk Fall Risk  01/10/2021 01/10/2020 01/05/2019 09/16/2017 10/06/2016  Falls in the past year? 1 0 0 No No  Number falls in past yr: 0 0 - - -  Injury with Fall? 0 0 - - -  Follow up Falls prevention discussed - - - -    FALL RISK PREVENTION PERTAINING TO THE HOME:  Any stairs in or around the home? Yes  If so, are there any without handrails? No  Home free of loose throw rugs in walkways, pet beds, electrical cords, etc? Yes  Adequate lighting in your home to reduce risk of falls? Yes   ASSISTIVE DEVICES UTILIZED TO PREVENT FALLS:  Life alert? Yes  Use of a cane, walker or w/c?  No  Grab bars in the bathroom? Yes  Shower chair or bench in shower? No  Elevated toilet seat or a handicapped toilet? Yes    Cognitive Function: Declined today.     6CIT Screen 01/10/2020 01/05/2019 09/16/2017  What Year? 0 points 0 points 0 points  What month? 0 points 0 points 0 points  What time? 0 points 0 points 0 points  Count back from 20 0 points 0 points 0 points  Months in reverse 0 points 0 points 0 points  Repeat phrase 0 points 0 points 0 points  Total Score 0 0 0    Immunizations Immunization History  Administered Date(s) Administered  . Fluad Quad(high Dose 65+) 09/12/2020  . Influenza Whole 08/22/2018  . Influenza, High Dose Seasonal PF 10/04/2015, 10/06/2016, 09/16/2017, 08/31/2018, 10/07/2019  . Influenza-Unspecified 07/22/2014  . Moderna Sars-Covid-2 Vaccination 02/02/2020, 03/01/2020, 11/09/2020  . Pneumococcal Conjugate-13 10/06/2016  . Pneumococcal Polysaccharide-23 11/13/2009  . Tdap 10/11/2020    TDAP status: Up to date  Flu Vaccine status: Up to date  Pneumococcal vaccine status: Up to date  Covid-19 vaccine status: Completed vaccines  Qualifies for Shingles Vaccine? Yes   Zostavax completed No   Shingrix Completed?: No.  Education has been provided regarding the importance of this vaccine. Patient has been advised to call insurance company to determine out of pocket expense if they have not yet received this vaccine. Advised may also receive vaccine at local pharmacy or Health Dept. Verbalized acceptance and understanding.  Screening Tests Health Maintenance  Topic Date Due  . COVID-19 Vaccine (4 - Booster for Moderna series) 05/09/2021  . DEXA SCAN  09/30/2022  . TETANUS/TDAP  10/11/2030  . INFLUENZA VACCINE  Completed  . PNA vac Low Risk Adult  Completed    Health Maintenance  There are no preventive care reminders to display for this patient.  Colorectal cancer screening: No longer required.   Mammogram status: No longer  required due to age.  Bone Density status: Completed 09/30/17. Results reflect: Bone density results: OSTEOPENIA. Repeat every 5 years.  Lung Cancer Screening: (Low Dose CT Chest recommended if Age 101-80 years, 30 pack-year currently smoking OR have quit w/in 15years.) does not qualify.   Additional Screening:  Vision Screening: Recommended annual ophthalmology exams for early detection of glaucoma and other disorders of the eye. Is the patient up to date with their annual eye exam?  Yes  Who is the provider or what is the name of the office in which the patient attends annual eye exams? Dr Neville Route @ [facility] If pt is not established with a provider, would they like to be referred to a provider to establish care? No .   Dental Screening: Recommended annual dental exams for proper oral hygiene  Community Resource Referral / Chronic Care Management: CRR required this visit?  No   CCM required this visit?  No      Plan:     I have personally reviewed and noted the following in the patient's chart:   . Medical and social history . Use of alcohol, tobacco or illicit drugs  . Current medications and supplements . Functional ability and status . Nutritional status . Physical activity . Advanced directives . List of other physicians . Hospitalizations, surgeries, and ER visits in previous 12 months . Vitals . Screenings to include cognitive, depression, and falls . Referrals and appointments  In addition, I have reviewed and discussed with patient certain preventive protocols, quality metrics, and best practice recommendations. A written personalized care plan for preventive services as well as general preventive health recommendations were provided to patient.     [place] Salman Sm[place], [place]   [id number]   Nurse Notes: None.

## 2021-01-10 ENCOUNTER — Ambulatory Visit (INDEPENDENT_AMBULATORY_CARE_PROVIDER_SITE_OTHER): Payer: Medicare HMO

## 2021-01-10 ENCOUNTER — Other Ambulatory Visit: Payer: Self-pay

## 2021-01-10 DIAGNOSIS — Z0000 Encounter for general adult medical examination without abnormal findings: Secondary | ICD-10-CM | POA: Diagnosis not present

## 2021-01-10 NOTE — Patient Instructions (Signed)
Teresa [name] , Thank you for taking time to come for your Medicare Wellness Visit. I appreciate your ongoing commitment to your health goals. Please review the following plan we discussed and let me know if I can assist you in the future.   Screening recommendations/referrals: Colonoscopy: No longer required.  Mammogram: No longer required.  Bone Density: Up to date, due 09/2022 Recommended yearly ophthalmology/optometry visit for glaucoma screening and checkup Recommended yearly dental visit for hygiene and checkup  Vaccinations: Influenza vaccine: Done 09/12/20 Pneumococcal vaccine: Completed series Tdap vaccine: Up to date, due 09/2030 Shingles vaccine: Shingrix discussed. Please contact your pharmacy for coverage information.     Advanced directives: Currently on file.  Conditions/risks identified: Fall risk preventatives discussed today. Recommend to increase water intake to 6-8 8 oz glasses a day. Also recommend to start exercising 3 days a week for at least 30 minutes at a time.   Next appointment: 02/01/21 @ 12:15 PM for a CCM call    Preventive Care 38 Years and Older, Female Preventive care refers to lifestyle choices and visits with your health care provider that can promote health and wellness. What does preventive care include?  A yearly physical exam. This is also called an annual well check.  Dental exams once or twice a year.  Routine eye exams. Ask your health care provider how often you should have your eyes checked.  Personal lifestyle choices, including:  Daily care of your teeth and gums.  Regular physical activity.  Eating a healthy diet.  Avoiding tobacco and drug use.  Limiting alcohol use.  Practicing safe sex.  Taking low-dose aspirin every day.  Taking vitamin and mineral supplements as recommended by your health care provider. What happens during an annual well check? The services and screenings done by your health care provider during your  annual well check will depend on your age, overall health, lifestyle risk factors, and family history of disease. Counseling  Your health care provider may ask you questions about your:  Alcohol use.  Tobacco use.  Drug use.  Emotional well-being.  Home and relationship well-being.  Sexual activity.  Eating habits.  History of falls.  Memory and ability to understand (cognition).  Work and work Statistician.  Reproductive health. Screening  You may have the following tests or measurements:  Height, weight, and BMI.  Blood pressure.  Lipid and cholesterol levels. These may be checked every 5 years, or more frequently if you are over 50 years old.  Skin check.  Lung cancer screening. You may have this screening every year starting at age 3 if you have a 30-pack-year history of smoking and currently smoke or have quit within the past 15 years.  Fecal occult blood test (FOBT) of the stool. You may have this test every year starting at age 80.  Flexible sigmoidoscopy or colonoscopy. You may have a sigmoidoscopy every 5 years or a colonoscopy every 10 years starting at age 25.  Hepatitis C blood test.  Hepatitis B blood test.  Sexually transmitted disease (STD) testing.  Diabetes screening. This is done by checking your blood sugar (glucose) after you have not eaten for a while (fasting). You may have this done every 1-3 years.  Bone density scan. This is done to screen for osteoporosis. You may have this done starting at age 40.  Mammogram. This may be done every 1-2 years. Talk to your health care provider about how often you should have regular mammograms. Talk with your health care provider  about your test results, treatment options, and if necessary, the need for more tests. Vaccines  Your health care provider may recommend certain vaccines, such as:  Influenza vaccine. This is recommended every year.  Tetanus, diphtheria, and acellular pertussis (Tdap, Td)  vaccine. You may need a Td booster every 10 years.  Zoster vaccine. You may need this after age 46.  Pneumococcal 13-valent conjugate (PCV13) vaccine. One dose is recommended after age 53.  Pneumococcal polysaccharide (PPSV23) vaccine. One dose is recommended after age 65. Talk to your health care provider about which screenings and vaccines you need and how often you need them. This information is not intended to replace advice given to you by your health care provider. Make sure you discuss any questions you have with your health care provider. Document Released: 01/04/2016 Document Revised: 08/27/2016 Document Reviewed: 10/09/2015 Elsevier Interactive Patient Education  2017 [employer] Prevention in the Home Falls can cause injuries. They can happen to people of all ages. There are many things you can do to make your home safe and to help prevent falls. What can I do on the outside of my home?  Regularly fix the edges of walkways and driveways and fix any cracks.  Remove anything that might make you trip as you walk through a door, such as a raised step or threshold.  Trim any bushes or trees on the path to your home.  Use bright outdoor lighting.  Clear any walking paths of anything that might make someone trip, such as rocks or tools.  Regularly check to see if handrails are loose or broken. Make sure that both sides of any steps have handrails.  Any raised decks and porches should have guardrails on the edges.  Have any leaves, snow, or ice cleared regularly.  Use sand or salt on walking paths during winter.  Clean up any spills in your garage right away. This includes oil or grease spills. What can I do in the bathroom?  Use night lights.  Install grab bars by the toilet and in the tub and shower. Do not use towel bars as grab bars.  Use non-skid mats or decals in the tub or shower.  If you need to sit down in the shower, use a plastic, non-slip  stool.  Keep the floor dry. Clean up any water that spills on the floor as soon as it happens.  Remove soap buildup in the tub or shower regularly.  Attach bath mats securely with double-sided non-slip rug tape.  Do not have throw rugs and other things on the floor that can make you trip. What can I do in the bedroom?  Use night lights.  Make sure that you have a light by your bed that is easy to reach.  Do not use any sheets or blankets that are too big for your bed. They should not hang down onto the floor.  Have a firm chair that has side arms. You can use this for support while you get dressed.  Do not have throw rugs and other things on the floor that can make you trip. What can I do in the kitchen?  Clean up any spills right away.  Avoid walking on wet floors.  Keep items that you use a lot in easy-to-reach places.  If you need to reach something above you, use a strong step stool that has a grab bar.  Keep electrical cords out of the way.  Do not use floor polish or  wax that makes floors slippery. If you must use wax, use non-skid floor wax.  Do not have throw rugs and other things on the floor that can make you trip. What can I do with my stairs?  Do not leave any items on the stairs.  Make sure that there are handrails on both sides of the stairs and use them. Fix handrails that are broken or loose. Make sure that handrails are as long as the stairways.  Check any carpeting to make sure that it is firmly attached to the stairs. Fix any carpet that is loose or worn.  Avoid having throw rugs at the top or bottom of the stairs. If you do have throw rugs, attach them to the floor with carpet tape.  Make sure that you have a light switch at the top of the stairs and the bottom of the stairs. If you do not have them, ask someone to add them for you. What else can I do to help prevent falls?  Wear shoes that:  Do not have high heels.  Have rubber bottoms.  Are  comfortable and fit you well.  Are closed at the toe. Do not wear sandals.  If you use a stepladder:  Make sure that it is fully opened. Do not climb a closed stepladder.  Make sure that both sides of the stepladder are locked into place.  Ask someone to hold it for you, if possible.  Clearly mark and make sure that you can see:  Any grab bars or handrails.  First and last steps.  Where the edge of each step is.  Use tools that help you move around (mobility aids) if they are needed. These include:  Canes.  Walkers.  Scooters.  Crutches.  Turn on the lights when you go into a dark area. Replace any light bulbs as soon as they burn out.  Set up your furniture so you have a clear path. Avoid moving your furniture around.  If any of your floors are uneven, fix them.  If there are any pets around you, be aware of where they are.  Review your medicines with your doctor. Some medicines can make you feel dizzy. This can increase your chance of falling. Ask your doctor what other things that you can do to help prevent falls. This information is not intended to replace advice given to you by your health care provider. Make sure you discuss any questions you have with your health care provider. Document Released: 10/04/2009 Document Revised: 05/15/2016 Document Reviewed: 01/12/2015 Elsevier Interactive Patient Education  2017 [employer].

## 2021-01-25 DIAGNOSIS — I480 Paroxysmal atrial fibrillation: Secondary | ICD-10-CM | POA: Diagnosis not present

## 2021-02-01 ENCOUNTER — Telehealth: Payer: Self-pay

## 2021-02-04 ENCOUNTER — Telehealth: Payer: Self-pay

## 2021-02-04 NOTE — Progress Notes (Signed)
Spoke to patient daughter to confirmed patient telephone appointment on 02/05/2021 for CCM at 2:45 pm with [name] the Clinical pharmacist.   Patient daughter denies patient having any side effects from current medication.  Patient daughter states to call [phone].  Patient daughter verbalized understanding.  [facility] Pharmacist Assistant [phone]

## 2021-02-05 ENCOUNTER — Ambulatory Visit (INDEPENDENT_AMBULATORY_CARE_PROVIDER_SITE_OTHER): Payer: Medicare HMO

## 2021-02-05 DIAGNOSIS — I4891 Unspecified atrial fibrillation: Secondary | ICD-10-CM | POA: Diagnosis not present

## 2021-02-05 DIAGNOSIS — E785 Hyperlipidemia, unspecified: Secondary | ICD-10-CM

## 2021-02-05 NOTE — Progress Notes (Signed)
Chronic Care Management Pharmacy Note  02/11/2021 Name:  Teresa [name] MRN:  [id number] DOB:  10-27-1941  Subjective: Teresa [name] is an 80 y.o. year old female who is a primary patient of Jerrol Banana., MD.  The CCM team was consulted for assistance with disease management and care coordination needs.    Engaged with patient by telephone for follow up visit in response to provider referral for pharmacy case management and/or care coordination services.   Consent to Services:  The patient was given the following information about Chronic Care Management services today, agreed to services, and gave verbal consent: 1. CCM service includes personalized support from designated clinical staff supervised by the primary care provider, including individualized plan of care and coordination with other care providers 2. 24/7 contact phone numbers for assistance for urgent and routine care needs. 3. Service will only be billed when office clinical staff spend 20 minutes or more in a month to coordinate care. 4. Only one practitioner may furnish and bill the service in a calendar month. 5.The patient may stop CCM services at any time (effective at the end of the month) by phone call to the office staff. 6. The patient will be responsible for cost sharing (co-pay) of up to 20% of the service fee (after annual deductible is met). Patient agreed to services and consent obtained.  Patient Care Team: Jerrol Banana., MD as PCP - General (Family Medicine) Wellington Hampshire, MD as PCP - Cardiology (Cardiology) Dasher, Rayvon Char, MD as Consulting Physician (Dermatology) Blanche East, MD as Consulting Physician (Neurosurgery) Estevan Ryder, MD as Referring Physician (Cardiology) Troxler, Adele Schilder as Attending Physician (Podiatry) Germaine Pomfret, [facility] as Pharmacist (Pharmacist) Quintin Alto, MD as Consulting Physician (Rheumatology) Diamond Nickel, DO as Consulting  Physician (Sports Medicine) Marchia Meiers, MD as Consulting Physician (Ophthalmology)  Recent office visits: 01/10/21: Patient presented to [facility], LPN for AWV.  40/9/81: Patient presented to Dr. [name] for follow-up. Patient started on cetirizine 10 mg daily.   Recent consult visits: 01/25/2021: Patient presented to Dr. Glennon Mac (Cardiology) for follow-up. Decreased metoprolol   12/05/20: Patient presented to Dr. Posey Pronto (Rheumatology) for follow-up. Patient started on tramadol 50 mg.   Hospital visits: None in previous 6 months  Objective:  Lab Results  Component Value Date   CREATININE 0.92 02/16/2019   BUN 11 02/16/2019   GFRNONAA 60 02/16/2019   GFRAA 69 02/16/2019   NA 144 02/16/2019   K 4.4 02/16/2019   CALCIUM 9.6 02/16/2019   CO2 23 02/16/2019    Lab Results  Component Value Date/Time   HGBA1C 5.8 (A) 11/26/2020 01:33 PM   HGBA1C 5.8 (H) 05/30/2020 03:18 PM    Last diabetic Eye exam: No results found for: HMDIABEYEEXA  Last diabetic Foot exam: No results found for: HMDIABFOOTEX   Lab Results  Component Value Date   CHOL 132 02/16/2019   HDL 57 02/16/2019   LDLCALC 62 02/16/2019   TRIG 65 02/16/2019   CHOLHDL 2.3 02/16/2019    Hepatic Function Latest Ref Rng & Units 02/16/2019 10/21/2017 06/23/2017  Total Protein 6.0 - 8.5 g/dL 6.7 7.0 7.1  Albumin 3.7 - 4.7 g/dL 4.3 - 4.1  AST 0 - 40 IU/L '19 19 24  ' ALT 0 - 32 IU/L '17 15 18  ' Alk Phosphatase 39 - 117 IU/L 103 - 74  Total Bilirubin 0.0 - 1.2 mg/dL 0.6 0.6 0.6  Bilirubin, Direct 0.1 - 0.5 mg/dL - -  0.1    Lab Results  Component Value Date/Time   TSH 2.130 05/30/2020 03:18 PM   TSH 1.340 02/16/2019 03:30 PM   FREET4 0.93 01/13/2018 11:40 AM    CBC Latest Ref Rng & Units 05/30/2020 02/16/2019 11/12/2018  WBC 3.4 - 10.8 x10E3/uL 5.5 6.0 5.5  Hemoglobin 11.1 - 15.9 g/dL 14.0 14.3 12.2  Hematocrit 34.0 - 46.6 % 43.0 46.0 38.8  Platelets 150 - 450 x10E3/uL 230 334 233    No results found for:  VD25OH  Clinical ASCVD: Yes  The ASCVD Risk score Mikey Bussing DC Jr., et al., 2013) failed to calculate for the following reasons:   The patient has a prior MI or stroke diagnosis    Depression screen [facility] 2/9 01/10/2021 01/10/2020 01/10/2020  Decreased Interest 0 0 0  Down, Depressed, Hopeless 0 0 0  PHQ - 2 Score 0 0 0  Altered sleeping - - -  Tired, decreased energy - - -  Change in appetite - - -  Feeling bad or failure about yourself  - - -  Trouble concentrating - - -  Moving slowly or fidgety/restless - - -  Suicidal thoughts - - -  PHQ-9 Score - - -  Difficult doing work/chores - - -  Some recent data might be hidden     Social History   Tobacco Use  Smoking Status Former Smoker  . Packs/day: 0.50  . Years: 35.00  . Pack years: 17.50  . Types: Cigarettes  Smokeless Tobacco Never Used  Tobacco Comment   Quit approx 1990   BP Readings from Last 3 Encounters:  11/26/20 (!) 122/52  10/11/20 (!) 138/45  09/12/20 98/62   Pulse Readings from Last 3 Encounters:  11/26/20 (!) 58  10/11/20 (!) 54  09/12/20 (!) 54   Wt Readings from Last 3 Encounters:  11/26/20 171 lb (77.6 kg)  10/11/20 176 lb 12.8 oz (80.2 kg)  09/12/20 171 lb (77.6 kg)    Assessment/Interventions: Review of patient past medical history, allergies, medications, health status, including review of consultants reports, laboratory and other test data, was performed as part of comprehensive evaluation and provision of chronic care management services.   SDOH:  (Social Determinants of Health) assessments and interventions performed: Yes SDOH Interventions   Flowsheet Row Most Recent Value  SDOH Interventions   Financial Strain Interventions Intervention Not Indicated  Transportation Interventions Intervention Not Indicated      CCM Care Plan  Allergies  Allergen Reactions  . Sulfa Antibiotics Hives  . Ivp Dye [Iodinated Diagnostic Agents] Swelling  . Amiodarone Nausea And Vomiting and Cough  .  Codeine Other (See Comments)    ALTERED MENTAL STATUS    Medications Reviewed Today    Reviewed by Germaine Pomfret, [facility] (Pharmacist) on 02/11/21 at [phone]  Med List Status: <None>  Medication Order Taking? Sig Documenting Provider Last Dose Status Informant  acetaminophen (TYLENOL) 650 MG CR tablet [id number]  in the morning and at bedtime.  [provider]  Active   apixaban (ELIQUIS) 5 MG TABS tablet [id number]  Take 1 tablet (5 mg total) by mouth 2 (two) times daily. Wellington Hampshire, MD  Active   budesonide-formoterol [facility]) 160-4.5 MCG/ACT inhaler [id number]  Inhale 2 puffs into the lungs 2 (two) times daily. Jerrol Banana., MD  Active Pharmacy Records           Med Note [facility], [facility] A   Thu Jan 10, 2021  2:38 PM) As needed  cetirizine (  ZYRTEC) 10 MG tablet [id number]  Take 1 tablet (10 mg total) by mouth daily. Jerrol Banana., MD  Active            Med Note [facility]ks, [facility] A   Tue Feb 05, 2021  2:43 PM) Taking as needed   Cholecalciferol 25 MCG (1000 UT) capsule [id number]  Take 1,000 Units by mouth daily.  [provider]  Active   donepezil (ARICEPT) 5 MG tablet [id number]  Take 1 tablet (5 mg total) by mouth at bedtime. Jerrol Banana., MD  Active   ENTRESTO 24-26 MG [id number]  Take 1 tablet by mouth 2 (two) times daily. [provider]  Active   fluticasone (FLONASE) 50 MCG/ACT nasal spray [id number]  Place 2 sprays into both nostrils daily as needed for allergies. Jerrol Banana., MD  Active   furosemide (LASIX) 20 MG tablet [id number]  TAKE 1 TABLET (20 MG TOTAL) BY MOUTH DAILY AS NEEDED (SHORTNESS OF BREATH). Minna Merritts, MD  Active   gabapentin (NEURONTIN) 300 MG capsule [id number]  Take two tablets by mouth in the AM, two tablets by mouth at bedtime, and 1 tablet at dinner as needed. Jerrol Banana., MD  Active   Melatonin 5 MG SUBL [id number] Yes Place 2 tablets (10 mg total) under the tongue at  bedtime. [provider] Taking Active   metoprolol succinate (TOPROL-XL) 50 MG 24 hr tablet [id number]  TAKE 1 TABLET (50 MG TOTAL) BY MOUTH 2 (TWO) TIMES DAILY Jerrol Banana., MD  Active   montelukast (SINGULAIR) 10 MG tablet [id number]  Take 10 mg by mouth daily as needed (for allergies).  [provider]  Active Pharmacy Records           Med Note Modena Nunnery, Ivar Drape   Fri Jan 08, 2018 11:59 AM)    Multiple Vitamins-Minerals (MULTIVITAMIN PO) [id number]  Take 1 tablet by mouth daily. [provider]  Active Pharmacy Records  OTEZLA 30 MG TABS [id number]  Take 2 tablets by mouth daily. [provider]  Active   rosuvastatin (CRESTOR) 5 MG tablet [id number]  TAKE 1 TABLET BY MOUTH EVERY DAY Jerrol Banana., MD  Active   traMADol Veatrice Bourbon) 50 MG tablet [id number]  ONE TAB AS NEEDED FOR JOINT PAIN [provider]  Active   venlafaxine (EFFEXOR) 75 MG tablet [id number]  TAKE 2 TABLETS (150 MG TOTAL) BY MOUTH DAILY. Jerrol Banana., MD  Active           Patient Active Problem List   Diagnosis Date Noted  . Need for prophylactic vaccination against rabies 10/11/2020  . Puncture wound 10/11/2020  . Need for Tdap vaccination 10/11/2020  . Dog bite of right lower leg with infection 10/11/2020  . Neuropathy 05/02/2020  . Chronic obstructive pulmonary disease ([place]) 03/11/2019  . Atrial fibrillation ([place]) 11/11/2018  . A-fib ([place]) 10/12/2018  . Rheumatoid arthritis ([place]) 10/10/2018  . Atrial fibrillation with RVR ([place]) 10/10/2018  . Psoriatic arthritis ([place]) 03/04/2018  . Unstable angina ([place])   . Atrial fibrillation with rapid ventricular response ([place]) 01/13/2018  . 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 Asthma 05/09/2017  . 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 Arthralgia of both hands 01/31/2016  . Ventricular premature depolarization 09/11/2015  . Psoriasis 07/16/2015  . 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 Hyperlipidemia 03/29/2015  . Pre-syncope 03/04/2015  . CAD (coronary artery disease)   .  PVC's (premature ventricular contractions)   . Ischemic cardiomyopathy   . DDD (degenerative disc disease), lumbar 01/18/2015  . Intervertebral disc disorder with radiculopathy of lumbar region 12/26/2014  . L-S radiculopathy 07/03/2014  . Breast CA ([place]) 06/28/2014  . Arthritis, degenerative 06/28/2014  . Acid reflux 03/08/2014  . 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 Fluad Quad(high Dose 65+) 09/12/2020  . Influenza Whole 08/22/2018  . Influenza, High Dose Seasonal PF 10/04/2015, 10/06/2016, 09/16/2017, 08/31/2018, 10/07/2019  . Influenza-Unspecified 07/22/2014  . Moderna Sars-Covid-2 Vaccination 02/02/2020, 03/01/2020, 11/09/2020  . Pneumococcal Conjugate-13 10/06/2016  . Pneumococcal Polysaccharide-23 11/13/2009  . Tdap 10/11/2020    Conditions to be addressed/monitored:  Hyperlipidemia, Atrial Fibrillation, Heart Failure, Coronary Artery Disease, GERD, COPD, Asthma, Osteoarthritis and Psoriatic Arthritis and Insomnia   Care Plan : General Pharmacy (Adult)  Updates made by Germaine Pomfret, RPH since 02/11/2021 12:00 AM    Problem: Hyperlipidemia, Atrial Fibrillation, Heart Failure, Coronary Artery Disease, GERD, COPD, Asthma, Osteoarthritis and Psoriatic Arthritis and Insomnia   Priority: High    Goal: Patient-Specific Goal   Start Date: 02/11/2021  Expected End Date: 08/11/2021  This Visit's Progress: On track  Priority: High  Note:   Current Barriers:  . Unable to maintain control of quality sleep  Pharmacist Clinical Goal(s):  Marland Kitchen Over the next 90 days, patient will achieve control of sleep as evidenced by patient testimony  through collaboration with PharmD and provider.   Interventions: . 1:1 collaboration with Jerrol Banana., MD regarding development and  update of comprehensive plan of care as evidenced by provider attestation and co-signature . Inter-disciplinary care team collaboration (see longitudinal plan of care) . Comprehensive medication review performed; medication list updated in electronic medical record  Hyperlipidemia: (LDL goal < 70) -controlled -Current treatment: . Rosuvastatin 5 mg daily  -Medications previously tried: NA   -Current dietary patterns: does not follow any specific dietary regimen  -Current exercise habits: does not follow any specific exercise regimen  -Educated on Importance of limiting foods high in cholesterol; -Recommended to continue current medication  Atrial Fibrillation (Goal: prevent stroke and major bleeding) -controlled  -CHADSVASC: 5 -Current treatment: . Rate control: Metoprolol XL 50 mg daily . Anticoagulation: Eliquis 5 mg twice daily  -Medications previously tried: NA -Counseled on importance of adherence to anticoagulant exactly as prescribed; bleeding risk associated with Eliquis  and importance of self-monitoring for signs/symptoms of bleeding; -Recommended to continue current medication  Heart Failure (Goal: control symptoms and prevent exacerbations) controlled Type: Systolic -NYHA Class: II (slight limitation of activity) -Ejection fraction: 40% (Date: 2013) -Current treatment:  Entresto 24-26 mg twice daily   Furosemide 20 mg daily as needed   Metoprolol XL 50 mg twice daily -Decreased metoprolol 50 mg daily for 7 days. During that time, daughter noticed that patient was having some confusion, speech changes. Symptoms improved upon resumption of metoprolol XL 50 mg twice daily.  -Medications previously tried: NA -Educated on Importance of weighing daily; if you gain more than 3 pounds in one day or 5 pounds in one week, contact provider's office  -Recommended to continue current medication  COPD / Asthma (Goal: control symptoms and prevent  exacerbations) -controlled -Current treatment  . Symbicort 160-4.5 mcg/act 2 puff twice daily  . Montelukast 10 mg daily as needed  -Medications previously tried: NA  -Gold Grade: Gold 2 (FEV1 50-79%) -Current COPD Classification:  A (low sx, <2 exacerbations/yr) -Exacerbations requiring treatment  in last 6 months: None -Patient reports consistent use of maintenance inhaler -Frequency of rescue inhaler use: NA -Counseled on Benefits of consistent maintenance inhaler use -Recommended to continue current medication  Insomnia (Goal: maintain quality sleep) -uncontrolled -Current treatment  . None  -Medications previously tried: NA   -Patient's daughter reports that the patient's sleep has been very poor quality sleep for some time, and wants to know what can be done to improve it. They recently purchased melatonin 10 mg tablets, but have not started using them.  -Recommended taking melatonin 5 mg nightly due to similar efficacy compared to 10 mg nightly   Patient Goals/Self-Care Activities . Over the next 90 days, patient will:  - take medications as prescribed  Follow Up Plan: Telephone follow up appointment with care management team member scheduled for: 06/03/21 at 3:00 PM       Medication Assistance: None required.  Patient affirms current coverage meets needs.  Patient's [facility] pharmacy is:  [id number] [address] [id number]: [phone]: [phone] Uses pill box? Yes Pt endorses 90% compliance  We discussed: Current pharmacy is preferred with insurance plan and patient is satisfied with pharmacy services Patient decided to: Continue current medication management strategy  Care Plan and Follow Up Patient Decision:  Patient agrees to Care Plan and Follow-up.  Plan: Telephone follow up appointment with care management team member scheduled for:  06/03/21 at 3:00 PM   [phone]

## 2021-02-06 ENCOUNTER — Other Ambulatory Visit: Payer: Self-pay | Admitting: *Deleted

## 2021-02-06 NOTE — Telephone Encounter (Signed)
Please advise? Patient is requesting refill for gabapentin.

## 2021-02-07 MED ORDER — GABAPENTIN 300 MG PO CAPS: ORAL_CAPSULE | ORAL | 1 refills | Status: DC

## 2021-02-11 NOTE — Patient Instructions (Addendum)
Visit Information It was great speaking with you today!  Please let me know if you have any questions about our visit.  Goals Addressed            This Visit's Progress   . Track and Manage Fluids and Swelling-Heart Failure       Timeframe:  Long-Range Goal Priority:  High Start Date:  02/11/2021                           Expected End Date: 08/11/2021                      Follow Up Date 06/03/2021    - call office if I gain more than 2 pounds in one day or 5 pounds in one week    Why is this important?    It is important to check your weight daily and watch how much salt and liquids you have.   It will help you to manage your heart failure.    Notes:        Patient Care Plan: General Pharmacy (Adult)    Problem Identified: Hyperlipidemia, Atrial Fibrillation, Heart Failure, Coronary Artery Disease, GERD, COPD, Asthma, Osteoarthritis and Psoriatic Arthritis and Insomnia   Priority: High    Goal: Patient-Specific Goal   Start Date: 02/11/2021  Expected End Date: 08/11/2021  This Visit's Progress: On track  Priority: High  Note:   Current Barriers:  . Unable to maintain control of quality sleep  Pharmacist Clinical Goal(s):  Marland Kitchen Over the next 90 days, patient will achieve control of sleep as evidenced by patient testimony  through collaboration with PharmD and provider.   Interventions: . 1:1 collaboration with Jerrol Banana., MD regarding development and update of comprehensive plan of care as evidenced by provider attestation and co-signature . Inter-disciplinary care team collaboration (see longitudinal plan of care) . Comprehensive medication review performed; medication list updated in electronic medical record  Hyperlipidemia: (LDL goal < 70) -controlled -Current treatment: . Rosuvastatin 5 mg daily  -Medications previously tried: NA   -Current dietary patterns: does not follow any specific dietary regimen  -Current exercise habits: does not follow any  specific exercise regimen  -Educated on Importance of limiting foods high in cholesterol; -Recommended to continue current medication  Atrial Fibrillation (Goal: prevent stroke and major bleeding) -controlled  -CHADSVASC: 5 -Current treatment: . Rate control: Metoprolol XL 50 mg daily . Anticoagulation: Eliquis 5 mg twice daily  -Medications previously tried: NA -Counseled on importance of adherence to anticoagulant exactly as prescribed; bleeding risk associated with Eliquis  and importance of self-monitoring for signs/symptoms of bleeding; -Recommended to continue current medication  Heart Failure (Goal: control symptoms and prevent exacerbations) controlled Type: Systolic -NYHA Class: II (slight limitation of activity) -Ejection fraction: 40% (Date: 2013) -Current treatment:  Entresto 24-26 mg twice daily   Furosemide 20 mg daily as needed   Metoprolol XL 50 mg twice daily -Decreased metoprolol 50 mg daily for 7 days. During that time, daughter noticed that patient was having some confusion, speech changes. Symptoms improved upon resumption of metoprolol XL 50 mg twice daily.  -Medications previously tried: NA -Educated on Importance of weighing daily; if you gain more than 3 pounds in one day or 5 pounds in one week, contact provider's office  -Recommended to continue current medication  COPD / Asthma (Goal: control symptoms and prevent exacerbations) -controlled -Current treatment  . Symbicort 160-4.5 mcg/act 2  puff twice daily  . Montelukast 10 mg daily as needed  -Medications previously tried: NA  -Gold Grade: Gold 2 (FEV1 50-79%) -Current COPD Classification:  A (low sx, <2 exacerbations/yr) -Exacerbations requiring treatment in last 6 months: None -Patient reports consistent use of maintenance inhaler -Frequency of rescue inhaler use: NA -Counseled on Benefits of consistent maintenance inhaler use -Recommended to continue current medication  Insomnia (Goal:  maintain quality sleep) -uncontrolled -Current treatment  . None  -Medications previously tried: NA   -Patient's daughter reports that the patient's sleep has been very poor quality sleep for some time, and wants to know what can be done to improve it. They recently purchased melatonin 10 mg tablets, but have not started using them.  -Recommended taking melatonin 5 mg nightly due to similar efficacy compared to 10 mg nightly   Patient Goals/Self-Care Activities . Over the next 90 days, patient will:  - take medications as prescribed  Follow Up Plan: Telephone follow up appointment with care management team member scheduled for: 06/03/21 at 3:00 PM       Patient agreed to services and verbal consent obtained.   The patient verbalized understanding of instructions, educational materials, and care plan provided today and declined offer to receive copy of patient instructions, educational materials, and care plan.   [place] [phone]

## 2021-02-16 ENCOUNTER — Other Ambulatory Visit: Payer: Self-pay | Admitting: Family Medicine

## 2021-02-17 NOTE — Telephone Encounter (Signed)
Requested medication (s) are due for refill today: yes  Requested medication (s) are on the active medication list: yes  Last refill:  08/13/20  Future visit scheduled: yes  Notes to clinic:  overdue lab work   Requested Prescriptions  Pending Prescriptions Disp Refills   rosuvastatin (CRESTOR) 5 MG tablet [Pharmacy Med Name: ROSUVASTATIN CALCIUM 5 MG TAB] 90 tablet 1    Sig: TAKE 1 TABLET BY MOUTH EVERY DAY      Cardiovascular:  Antilipid - Statins Failed - 02/16/2021  7:39 PM      Failed - Total Cholesterol in normal range and within 360 days    Cholesterol, Total  Date Value Ref Range Status  02/16/2019 132 100 - 199 mg/dL Final          Failed - LDL in normal range and within 360 days    LDL Calculated  Date Value Ref Range Status  02/16/2019 62 0 - 99 mg/dL Final          Failed - HDL in normal range and within 360 days    HDL  Date Value Ref Range Status  02/16/2019 57 >39 mg/dL Final          Failed - Triglycerides in normal range and within 360 days    Triglycerides  Date Value Ref Range Status  02/16/2019 65 0 - 149 mg/dL Final          Passed - Patient is not pregnant      Passed - Valid encounter within last 12 months    Recent Outpatient Visits           2 months ago Hyperglycemia   [facility] Jerrol Banana., MD   4 months ago Dog bite of right lower leg with infection, initial encounter   [facility], Kelby Aline, FNP   5 months ago Encounter for annual physical exam   [facility] Jerrol Banana., MD   8 months ago Neuropathy   [facility] Jerrol Banana., MD   9 months ago Neuropathy   [facility] Jerrol Banana., MD       Future Appointments             In 2 months Jerrol Banana., MD [facility]

## 2021-02-19 ENCOUNTER — Other Ambulatory Visit: Payer: Self-pay | Admitting: Family Medicine

## 2021-02-22 ENCOUNTER — Emergency Department: Payer: Medicare HMO

## 2021-02-22 ENCOUNTER — Telehealth: Payer: Self-pay | Admitting: Emergency Medicine

## 2021-02-22 ENCOUNTER — Encounter: Payer: Self-pay | Admitting: Emergency Medicine

## 2021-02-22 ENCOUNTER — Emergency Department
Admission: EM | Admit: 2021-02-22 | Discharge: 2021-02-22 | Disposition: A | Discharge status: Home / Self Care | Payer: Medicare HMO | Attending: Emergency Medicine | Admitting: Emergency Medicine

## 2021-02-22 ENCOUNTER — Other Ambulatory Visit: Payer: Self-pay

## 2021-02-22 DIAGNOSIS — Z951 Presence of aortocoronary bypass graft: Secondary | ICD-10-CM | POA: Insufficient documentation

## 2021-02-22 DIAGNOSIS — Z96642 Presence of left artificial hip joint: Secondary | ICD-10-CM | POA: Diagnosis not present

## 2021-02-22 DIAGNOSIS — Z853 Personal history of malignant neoplasm of breast: Secondary | ICD-10-CM | POA: Diagnosis not present

## 2021-02-22 DIAGNOSIS — R001 Bradycardia, unspecified: Secondary | ICD-10-CM | POA: Diagnosis not present

## 2021-02-22 DIAGNOSIS — I110 Hypertensive heart disease with heart failure: Secondary | ICD-10-CM | POA: Diagnosis not present

## 2021-02-22 DIAGNOSIS — I5042 Chronic combined systolic (congestive) and diastolic (congestive) heart failure: Secondary | ICD-10-CM | POA: Insufficient documentation

## 2021-02-22 DIAGNOSIS — Z7951 Long term (current) use of inhaled steroids: Secondary | ICD-10-CM | POA: Diagnosis not present

## 2021-02-22 DIAGNOSIS — J449 Chronic obstructive pulmonary disease, unspecified: Secondary | ICD-10-CM | POA: Insufficient documentation

## 2021-02-22 DIAGNOSIS — Z87891 Personal history of nicotine dependence: Secondary | ICD-10-CM | POA: Insufficient documentation

## 2021-02-22 DIAGNOSIS — I2510 Atherosclerotic heart disease of native coronary artery without angina pectoris: Secondary | ICD-10-CM | POA: Insufficient documentation

## 2021-02-22 DIAGNOSIS — J45909 Unspecified asthma, uncomplicated: Secondary | ICD-10-CM | POA: Insufficient documentation

## 2021-02-22 DIAGNOSIS — Z96652 Presence of left artificial knee joint: Secondary | ICD-10-CM | POA: Diagnosis not present

## 2021-02-22 DIAGNOSIS — W08XXXA Fall from other furniture, initial encounter: Secondary | ICD-10-CM | POA: Diagnosis not present

## 2021-02-22 DIAGNOSIS — M25551 Pain in right hip: Secondary | ICD-10-CM

## 2021-02-22 DIAGNOSIS — Z7901 Long term (current) use of anticoagulants: Secondary | ICD-10-CM | POA: Diagnosis not present

## 2021-02-22 DIAGNOSIS — Z79899 Other long term (current) drug therapy: Secondary | ICD-10-CM | POA: Diagnosis not present

## 2021-02-22 LAB — URINALYSIS, COMPLETE (UACMP) WITH MICROSCOPIC
Bilirubin Urine: NEGATIVE
Glucose, UA: NEGATIVE mg/dL
Ketones, ur: NEGATIVE mg/dL
Leukocytes,Ua: NEGATIVE
Nitrite: NEGATIVE
Protein, ur: NEGATIVE mg/dL
Specific Gravity, Urine: 1.004 — ABNORMAL LOW (ref 1.005–1.030)
pH: 7 (ref 5.0–8.0)

## 2021-02-22 MED ORDER — HYDROCODONE-ACETAMINOPHEN 5-325 MG PO TABS: 1.0000 | ORAL_TABLET | Freq: Four times a day (QID) | ORAL | 0 refills | Status: DC | PRN

## 2021-02-22 MED ORDER — HYDROCODONE-ACETAMINOPHEN 7.5-325 MG/15ML PO SOLN: 10.0000 mL | Freq: Once | ORAL | Status: DC

## 2021-02-22 MED ORDER — LIDOCAINE 5 % EX PTCH
1.0000 | MEDICATED_PATCH | CUTANEOUS | Status: DC
  Administered 2021-02-22: 1 via TRANSDERMAL
  Filled 2021-02-22: qty 1

## 2021-02-22 MED ORDER — HYDROCODONE-ACETAMINOPHEN 5-325 MG PO TABS: 1.0000 | ORAL_TABLET | Freq: Four times a day (QID) | ORAL | 0 refills | Status: AC | PRN

## 2021-02-22 MED ORDER — TRAMADOL HCL 50 MG PO TABS
50.0000 mg | ORAL_TABLET | Freq: Once | ORAL | Status: DC
  Filled 2021-02-22: qty 1

## 2021-02-22 MED ORDER — HYDROCODONE-ACETAMINOPHEN 5-325 MG PO TABS
1.0000 | ORAL_TABLET | Freq: Once | ORAL | Status: AC
  Administered 2021-02-22: 1 via ORAL
  Filled 2021-02-22: qty 1

## 2021-02-22 MED ORDER — LIDOCAINE 5 % EX PTCH: 1.0000 | MEDICATED_PATCH | Freq: Two times a day (BID) | CUTANEOUS | 0 refills | Status: DC

## 2021-02-22 NOTE — ED Notes (Addendum)
See triage note  Presents with pain to right hip   States her right hip gave out last pm  Was able to get up and get to sofa    Unable to stand with full wt   States pain radiates from right flank into groin when standing

## 2021-02-22 NOTE — ED Provider Notes (Signed)
[facility] Emergency Department Provider Note   ____________________________________________   Event Date/Time   First MD Initiated Contact with Patient 02/22/21 1311     (approximate)  I have reviewed the triage vital signs and the nursing notes.   HISTORY  Chief Complaint Hip Pain    HPI Teresa [name] is a 80 y.o. female patient complains of right lateral hip pain secondary to mechanical fall last night.  Patient states she fell after arising from a couch.  Patient denies LOC or head injury.  Patient states pain increased with ambulation.  Denies loss of sensation.  Rates pain as a 10/10.  Described pain as "achy".  No palliative measure prior to arrival.         Past Medical History:  Diagnosis Date   Arthritis    Asthma    Back pain    Breast cancer ([place]) 2009   left breast    CAD (coronary artery disease)    a. 1994 s/p CABG x 1 (LIMA->LAD); b. 03/2015 MV: No ischemia; c. MV 11/18: small fixed apical defect likely secondary to breast attenuation, EF of 42%, frequent PVCs; d. 12/2017 Cath: LM nl, LAD 20p, D1/2/3 nl, LCX min irregs, OM1/2/3 min irregs, RCA nl, RPDA nl, RPL1/2 nl, LIMA->LAD atretic.   Chronic combined systolic (congestive) and diastolic (congestive) heart failure ([place])    a. 2013 EF 40%;  b. 03/2015 Echo: EF 55-60%; c. 12/18 Echo: EF of 35-40%; d. 12/2017 TEE: EF 35-40%; e. 03/2018 Echo: EF 40-45%; f. 09/2018 Echo: EF 35%.   Dental crowns present    caps- left back top, right back bottom   Gastroesophageal reflux disease    Hyperlipidemia    Hypertension    MI (myocardial infarction) ([place]) 1994   Mixed Ischemic & Nonischemic cardiomyopathy    a. 2013 EF 40%;  b. 03/2015 Echo: EF 55-60%; c. 12/18 Echo: EF of 35-40%, hypokinesis of the anteroseptal, and apical myocardium, mild to mod MR, mod dil LA, nl RV fxn, PASP 53 mmHg; d. 12/2017 TEE: EF 35-40%, diff HK, mild to mod MR. small PFO. No LAA/RAA thrombus; e. 03/2018 Echo:  EF 40-45%, antsept/inf HK, Gr2 DD, mild MR, mod idl LA, mild to mod TR, PASP 35-61mmHg; f. 09/2018 Echo: EF 35%.   Persistent atrial fibrillation ([place])    a. diagnosed 01/13/2018; b. CHADS2VASc = 6 --> Eliquis; c. 12/2017 s/p TEE/DCCV. Amio started but d/c'd 01/2018 2/2 n/anorexia; d. 10/2018 DCCV-->recurrent Afib w/in days; e. 10/2018 DCCV x 2 in ED->persistent Afib.   Personal history of radiation therapy    Psoriasis    PSVT (paroxysmal supraventricular tachycardia) ([place])    a. 02/2015 Holter: short runs of SVT and NSVT.   Pulmonary hypertension (HCC)    PVC's (premature ventricular contractions)    a. 03/2018 24h Holter: Freq PVC's with a total of 421 beats in 24 hrs. 7 short runs of SVT likely representing Afib.   Rheumatoid arthritis ([place])    feet, hands   Vertigo    approx 2x/yr    Patient Active Problem List   Diagnosis Date Noted   Need for prophylactic vaccination against rabies 10/11/2020   Puncture wound 10/11/2020   Need for Tdap vaccination 10/11/2020   Dog bite of right lower leg with infection 10/11/2020   Neuropathy 05/02/2020   Chronic obstructive pulmonary disease ([place]) 03/11/2019   Atrial fibrillation ([place]) 11/11/2018   A-fib ([place]) 10/12/2018   Rheumatoid arthritis ([place]) 10/10/2018   Atrial fibrillation with  RVR ([place]) 10/10/2018   Psoriatic arthritis ([place]) 03/04/2018   Unstable angina (HCC)    Atrial fibrillation with rapid ventricular response ([place]) 01/13/2018   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   Asthma 05/09/2017   Primary osteoarthritis of both first carpometacarpal joints 01/31/2016   Arthralgia of both hands 01/31/2016   Ventricular premature depolarization 09/11/2015   Psoriasis 07/16/2015   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   Hyperlipidemia 03/29/2015   Pre-syncope 03/04/2015   CAD (coronary artery disease)    PVC's (premature ventricular contractions)    Ischemic cardiomyopathy    DDD (degenerative  disc disease), lumbar 01/18/2015   Intervertebral disc disorder with radiculopathy of lumbar region 12/26/2014   L-S radiculopathy 07/03/2014   Breast CA ([place]) 06/28/2014   Arthritis, degenerative 06/28/2014   Acid reflux 03/08/2014   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    Past Surgical History:  Procedure Laterality Date   ABDOMINAL HYSTERECTOMY     back fusion     BREAST BIOPSY Right    1991 negative   BREAST EXCISIONAL BIOPSY Left    2009 positive   BREAST LUMPECTOMY Left 2009   CARDIAC CATHETERIZATION     [place]     [place] N/A 01/15/2018   Procedure: CARDIOVERSION;  Surgeon: Wellington Hampshire, MD;  Location: [facility] ORS;  Service: Cardiovascular;  Laterality: N/A;   CARDIOVERSION N/A 10/29/2018   Procedure: CARDIOVERSION;  Surgeon: Minna Merritts, MD;  Location: [facility] ORS;  Service: Cardiovascular;  Laterality: N/A;   CHOLECYSTECTOMY     CORONARY ARTERY BYPASS GRAFT  1994   1 vessel - [facility]   FOOT ARTHRODESIS Right 07/24/2016   Procedure: FUSION FIRST METATARSAL CUNEIFORM JOINT RIGHT FOOT, FUSION SECOND METATARSAL CUNEFORM JOINT BUNION REPAIR RIGHT FOOT;  Surgeon: Albertine Patricia, DPM;  Location: [place];  Service: Podiatry;  Laterality: Right;   FOOT FRACTURE SURGERY     HARDWARE REMOVAL Right 07/24/2016   Procedure: REMOVAL HARDWARE LATERAL MALLEOUS RIGHT ANKLE;  Surgeon: Albertine Patricia, DPM;  Location: [place];  Service: Podiatry;  Laterality: Right;  REMOVAL OF PIN WHICH WAS INTACT   HERNIA REPAIR     NECK SURGERY     RIGHT/LEFT HEART CATH AND CORONARY/GRAFT ANGIOGRAPHY N/A 01/14/2018   Procedure: LEFT HEART CATH AND CORONARY ANGIOGRAPHY;  Surgeon: Wellington Hampshire, MD;  Location: [place] CV [facility];  Service: Cardiovascular;  Laterality: N/A;   TEE WITHOUT CARDIOVERSION N/A 01/15/2018   Procedure: TRANSESOPHAGEAL ECHOCARDIOGRAM (TEE);  Surgeon: Wellington Hampshire, MD;  Location:  [facility];  Service: Cardiovascular;  Laterality: N/A;   TOTAL HIP ARTHROPLASTY Left    TOTAL KNEE ARTHROPLASTY Left     Prior to Admission medications   Medication Sig Start Date End Date Taking? Authorizing Provider  HYDROcodone-acetaminophen (NORCO/VICODIN) 5-325 MG tablet Take 1 tablet by mouth every 6 (six) hours as needed for moderate pain. 02/22/21  Yes Sable Feil, PA-C  lidocaine (LIDODERM) 5 % Place 1 patch onto the skin every 12 (twelve) hours. Remove & Discard patch within 12 hours or as directed by MD 02/22/21 02/22/22 Yes Sable Feil, PA-C  acetaminophen (TYLENOL) 650 MG CR tablet in the morning and at bedtime.  01/03/20   [provider]  apixaban (ELIQUIS) 5 MG TABS tablet Take 1 tablet (5 mg total) by mouth 2 (two) times daily. 11/16/18   Wellington Hampshire, MD  budesonide-formoterol (SYMBICORT) 160-4.5 MCG/ACT inhaler Inhale 2 puffs into the lungs 2 (  two) times daily. 10/06/16   Jerrol Banana., MD  cetirizine (ZYRTEC) 10 MG tablet Take 1 tablet (10 mg total) by mouth daily. 11/26/20   Jerrol Banana., MD  Cholecalciferol 25 MCG (1000 UT) capsule Take 1,000 Units by mouth daily.     [provider]  donepezil (ARICEPT) 5 MG tablet Take 1 tablet (5 mg total) by mouth at bedtime. 12/07/20   Jerrol Banana., MD  ENTRESTO 24-26 MG Take 1 tablet by mouth 2 (two) times daily. 05/02/20   [provider]  fluticasone (FLONASE) 50 MCG/ACT nasal spray Place 2 sprays into both nostrils daily as needed for allergies. 11/26/20   Jerrol Banana., MD  furosemide (LASIX) 20 MG tablet TAKE 1 TABLET (20 MG TOTAL) BY MOUTH DAILY AS NEEDED (SHORTNESS OF BREATH). 12/06/18   Minna Merritts, MD  gabapentin (NEURONTIN) 300 MG capsule Take two tablets by mouth in the AM, two tablets by mouth at bedtime, and 1 tablet at dinner as needed. 02/07/21   Jerrol Banana., MD  Melatonin 5 MG SUBL Place 2 tablets (10 mg total) under the tongue at  bedtime.    [provider]  metoprolol succinate (TOPROL-XL) 50 MG 24 hr tablet TAKE 1 TABLET (50 MG TOTAL) BY MOUTH 2 (TWO) TIMES DAILY 10/18/19   Jerrol Banana., MD  montelukast (SINGULAIR) 10 MG tablet Take 10 mg by mouth daily as needed (for allergies).  04/02/15   [provider]  Multiple Vitamins-Minerals (MULTIVITAMIN PO) Take 1 tablet by mouth daily.    [provider]  OTEZLA 30 MG TABS Take 2 tablets by mouth daily.    [provider]  rosuvastatin (CRESTOR) 5 MG tablet TAKE 1 TABLET BY MOUTH EVERY DAY 02/18/21   Jerrol Banana., MD  traMADol (ULTRAM) 50 MG tablet ONE TAB AS NEEDED FOR JOINT PAIN 01/02/20   [provider]  venlafaxine (EFFEXOR) 75 MG tablet TAKE 2 TABLETS (150 MG TOTAL) BY MOUTH DAILY. 02/19/21   Jerrol Banana., MD    Allergies Sulfa antibiotics, Ivp dye [iodinated diagnostic agents], Amiodarone, and Codeine  Family History  Problem Relation Age of Onset   Heart attack Father    Congestive Heart Failure Mother    Breast cancer Mother 42   Kidney failure Sister    Prostate cancer Brother    Bladder Cancer Brother    Lung cancer Sister    Arthritis Sister    Breast cancer Sister    Arthritis Sister    Breast cancer Sister    Lung cancer Sister    Lung cancer Brother    Arthritis Brother     Social History Social History   Tobacco Use   Smoking status: Former Smoker    Packs/day: 0.50    Years: 35.00    Pack years: 17.50    Types: Cigarettes   Smokeless tobacco: Never Used   Tobacco comment: Quit approx Engineer, manufacturing systems Use: Never used  Substance Use Topics   Alcohol use: No   Drug use: No    Review of Systems Constitutional: No fever/chills Eyes: No visual changes. ENT: No sore throat. Cardiovascular: Denies chest pain. Respiratory: Denies shortness of breath. Gastrointestinal: No abdominal pain.  No nausea, no vomiting.  No diarrhea.  No  constipation. Genitourinary: Negative for dysuria. Musculoskeletal: Right lateral hip pain.  Skin: Negative for rash. Neurological: Negative for headaches, focal weakness or numbness.  Endocrine:  Hyperlipidemia and hypertension. Hematological/Lymphatic:  Allergic/Immunilogical: Sulfa antibiotics, IVP dye,Amiodarone, and codeine. ____________________________________________   PHYSICAL EXAM:  VITAL SIGNS: ED Triage Vitals  Enc Vitals Group     BP 02/22/21 1248 112/64     Pulse Rate 02/22/21 1248 (S) (!) 50     Resp 02/22/21 1248 15     Temp 02/22/21 1248 98 F (36.7 C)     Temp Source 02/22/21 1248 Oral     SpO2 02/22/21 1248 100 %     Weight 02/22/21 1223 171 lb 1.2 oz (77.6 kg)     Height 02/22/21 1223 5\' 4"  (1.626 m)     Head Circumference --      Peak Flow --      Pain Score 02/22/21 1222 10     Pain Loc --      Pain Edu? --      Excl. in [place]? --    Constitutional: Alert and oriented. Well appearing and in no acute distress. Eyes: Conjunctivae are normal. PERRL. EOMI. Head: Atraumatic. Nose: No congestion/rhinnorhea. Mouth/Throat: Mucous membranes are moist.  Oropharynx non-erythematous. Neck: No stridor.  No cervical spine tenderness to palpation. Hematological/Lymphatic/Immunilogical: No cervical lymphadenopathy. Cardiovascular:  bradycardia, regular rhythm. Grossly normal heart sounds.  Good peripheral circulation. Respiratory: Normal respiratory effort.  No retractions. Lungs CTAB. Gastrointestinal: Soft and nontender. No distention. No abdominal bruits. No CVA tenderness. Genitourinary: Deferred Musculoskeletal: No obvious right hip deformity.  No leg length discrepancy.  Patient has moderate guarding palpation right lateral hip. Neurologic:  Normal speech and language. No gross focal neurologic deficits are appreciated. No gait instability. Skin:  Skin is warm, dry and intact. No rash noted. Psychiatric: Mood and affect are normal. Speech and behavior are  normal.  ____________________________________________   LABS (all labs ordered are listed, but only abnormal results are displayed)  Labs Reviewed  URINALYSIS, COMPLETE (UACMP) WITH MICROSCOPIC - Abnormal; Notable for the following components:      Result Value   Color, Urine STRAW (*)    APPearance CLEAR (*)    Specific Gravity, Urine 1.004 (*)    Hgb urine dipstick SMALL (*)    Bacteria, UA RARE (*)    All other components within normal limits   ____________________________________________  EKG  Read by heart station Dr. ____________________________________________  RADIOLOGY I, Sable Feil, personally viewed and evaluated these images (plain radiographs) as part of my medical decision making, as well as reviewing the written report by the radiologist.  ED MD interpretation: No acute findings x-ray of the right hip.  Official radiology report(s): DG Hip Unilat W or Wo Pelvis 2-3 Views Right  Result Date: 02/22/2021 CLINICAL DATA:  Right hip pain since the patient's hip gave way last night. EXAM: DG HIP (WITH OR WITHOUT PELVIS) 2-3V RIGHT COMPARISON:  Plain films right hip 11/26/2020. FINDINGS: There is no acute bony or joint abnormality. Advanced right hip osteoarthritis is unchanged in appearance. The patient is status post left hip replacement. Lower lumbar fusion hardware is partially imaged. IMPRESSION: No acute abnormality. No change in advanced right hip osteoarthritis. Electronically Signed   By: Inge Rise M.D.   On: 02/22/2021 14:06    ____________________________________________   PROCEDURES  Procedure(s) performed (including Critical Care):  Procedures   ____________________________________________   INITIAL IMPRESSION / ASSESSMENT AND PLAN / ED COURSE  As part of my medical decision making, I reviewed the following data within the [employer]         Patient  presents with right hip pain secondary to a fall last night.   Differentials consist of hip contusion versus fracture.  Concern for near syncope episode secondary to bradycardia.  Discussed no acute findings on x-ray of the right hip.  Patient has asymptomatic bradycardia.      ____________________________________________   FINAL CLINICAL IMPRESSION(S) / ED DIAGNOSES  Final diagnoses:  Right hip pain     ED Discharge Orders         Ordered    lidocaine (LIDODERM) 5 %  Every 12 hours        02/22/21 1431    HYDROcodone-acetaminophen (NORCO/VICODIN) 5-325 MG tablet  Every 6 hours PRN        02/22/21 1431          *Please note:  Teresa [name] was evaluated in Emergency Department on 02/22/2021 for the symptoms described in the history of present illness. She was evaluated in the context of the global COVID-19 pandemic, which necessitated consideration that the patient might be at risk for infection with the SARS-CoV-2 virus that causes COVID-19. Institutional protocols and algorithms that pertain to the evaluation of patients at risk for COVID-19 are in a state of rapid change based on information released by regulatory bodies including the CDC and federal and state organizations. These policies and algorithms were followed during the patient's care in the ED.  Some ED evaluations and interventions may be delayed as a result of limited staffing during and the pandemic.*   Note:  This document was prepared using Dragon voice recognition software and may include unintentional dictation errors.    Sable Feil, PA-C 02/22/21 1435    Arta Silence, MD 02/22/21 [phone]

## 2021-02-22 NOTE — Telephone Encounter (Cosign Needed)
[facility] [place]. Prescription sent to [facility] in [place].

## 2021-02-22 NOTE — ED Triage Notes (Signed)
Arrives C/O right hip pain.  States fell last night after standing up from couch, lost balance and fell.

## 2021-02-22 NOTE — Telephone Encounter (Cosign Needed)
[facility] does not have Norco in stock.  [facility] [address] and [facility] in stock at [facility] [address] in [place].  Norco prescription was sent to aforementioned pharmacy and pharmacy was notified of change.

## 2021-02-22 NOTE — Discharge Instructions (Addendum)
No acute findings on x-ray of your right hip.  Follow discharge care instructions and take medication as directed.

## 2021-02-25 ENCOUNTER — Telehealth: Payer: Self-pay

## 2021-03-01 DIAGNOSIS — G4733 Obstructive sleep apnea (adult) (pediatric): Secondary | ICD-10-CM | POA: Diagnosis not present

## 2021-03-01 DIAGNOSIS — R5383 Other fatigue: Secondary | ICD-10-CM | POA: Diagnosis not present

## 2021-03-01 DIAGNOSIS — I255 Ischemic cardiomyopathy: Secondary | ICD-10-CM | POA: Diagnosis not present

## 2021-03-15 ENCOUNTER — Telehealth: Payer: Self-pay

## 2021-03-15 NOTE — Telephone Encounter (Signed)
Copied from [facility] [id number]. Topic: Referral - Request for Referral >> Mar 15, 2021 11:07 AM Yvette Rack wrote: Has patient seen PCP for this complaint? yes  *If NO, is insurance requiring patient see PCP for this issue before PCP can refer them? Referral for which specialty: neurology  Preferred provider/office: no specific provider Reason for referral: Pt requests referral prior to hip surgery

## 2021-03-15 NOTE — Telephone Encounter (Signed)
Ok to refer? Please advise. Thanks!  

## 2021-03-18 NOTE — Telephone Encounter (Signed)
Spoke to the patient, and she reports that her daughter can explain the reasoning for the neurology referral. Called pts daughter Helene Kelp) and no answer. Left message to call back.

## 2021-03-18 NOTE — Telephone Encounter (Signed)
What is the reason for referral?

## 2021-03-18 NOTE — Telephone Encounter (Signed)
Pt daughter Teresa [name] is calling back and the reason for the referral to see neurologist for the brain due to memory issue

## 2021-03-18 NOTE — Telephone Encounter (Signed)
Please review. Thanks!  

## 2021-03-19 ENCOUNTER — Other Ambulatory Visit: Payer: Self-pay | Admitting: *Deleted

## 2021-03-19 DIAGNOSIS — G3184 Mild cognitive impairment, so stated: Secondary | ICD-10-CM

## 2021-03-19 NOTE — Progress Notes (Signed)
Referral ordered

## 2021-03-19 NOTE — Telephone Encounter (Signed)
ok 

## 2021-03-19 NOTE — Telephone Encounter (Signed)
Referral ordered

## 2021-03-21 DIAGNOSIS — M1611 Unilateral primary osteoarthritis, right hip: Secondary | ICD-10-CM | POA: Diagnosis not present

## 2021-03-29 ENCOUNTER — Telehealth: Payer: Self-pay

## 2021-03-29 NOTE — Progress Notes (Signed)
\   Chronic Care Management Pharmacy Assistant   Name: SHALYN KORAL  MRN: [id number] DOB: [birth year]   Reason for Encounter: Medication Review /General Adherence Call.   Recent office visits:  No recent Office Visit  Recent consult visits:  03/01/2021 Cardiology [facility] visits:  Medication Reconciliation was completed by comparing discharge summary, patient's EMR and Pharmacy list, and upon discussion with patient.  Admitted to the hospital on 02/22/2021 due to Right Hip Pain. Discharge date was 02/22/2021. Discharged from [facility]?Medications Started at [facility] Discharge:??  -started Lidocaine 5% 1 patch Transdermal Every 12 hours   Started Hydrocodone-Acetaminophen 5-325 mg 1 tablet Oral Every 6 hours PRN  Medication Changes at Hospital Discharge: -Changed None   Medications Discontinued at Hospital Discharge: -Stopped None    Medications that remain the same after Hospital Discharge:??  -All other medications will remain the same.    Medications: Outpatient Encounter Medications as of 03/29/2021  Medication Sig Note  . acetaminophen (TYLENOL) 650 MG CR tablet in the morning and at bedtime.    Marland Kitchen apixaban (ELIQUIS) 5 MG TABS tablet Take 1 tablet (5 mg total) by mouth 2 (two) times daily.   . budesonide-formoterol (SYMBICORT) 160-4.5 MCG/ACT inhaler Inhale 2 puffs into the lungs 2 (two) times daily. 01/10/2021: As needed  . cetirizine (ZYRTEC) 10 MG tablet Take 1 tablet (10 mg total) by mouth daily. 02/05/2021: Taking as needed   . Cholecalciferol 25 MCG (1000 UT) capsule Take 1,000 Units by mouth daily.    Marland Kitchen donepezil (ARICEPT) 5 MG tablet Take 1 tablet (5 mg total) by mouth at bedtime.   Marland Kitchen ENTRESTO 24-26 MG Take 1 tablet by mouth 2 (two) times daily.   . fluticasone (FLONASE) 50 MCG/ACT nasal spray Place 2 sprays into both nostrils daily as needed for allergies.   . furosemide (LASIX) 20 MG tablet TAKE 1 TABLET (20 MG TOTAL) BY MOUTH  DAILY AS NEEDED (SHORTNESS OF BREATH).   Marland Kitchen gabapentin (NEURONTIN) 300 MG capsule Take two tablets by mouth in the AM, two tablets by mouth at bedtime, and 1 tablet at dinner as needed.   Marland Kitchen HYDROcodone-acetaminophen (NORCO/VICODIN) 5-325 MG tablet Take 1 tablet by mouth every 6 (six) hours as needed for moderate pain.   Marland Kitchen lidocaine (LIDODERM) 5 % Place 1 patch onto the skin every 12 (twelve) hours. Remove & Discard patch within 12 hours or as directed by MD   . Melatonin 5 MG SUBL Place 2 tablets (10 mg total) under the tongue at bedtime.   . metoprolol succinate (TOPROL-XL) 50 MG 24 hr tablet TAKE 1 TABLET (50 MG TOTAL) BY MOUTH 2 (TWO) TIMES DAILY   . montelukast (SINGULAIR) 10 MG tablet Take 10 mg by mouth daily as needed (for allergies).    . Multiple Vitamins-Minerals (MULTIVITAMIN PO) Take 1 tablet by mouth daily.   Marland Kitchen OTEZLA 30 MG TABS Take 2 tablets by mouth daily.   . rosuvastatin (CRESTOR) 5 MG tablet TAKE 1 TABLET BY MOUTH EVERY DAY   . traMADol (ULTRAM) 50 MG tablet ONE TAB AS NEEDED FOR JOINT PAIN   . venlafaxine (EFFEXOR) 75 MG tablet TAKE 2 TABLETS (150 MG TOTAL) BY MOUTH DAILY.    No facility-administered encounter medications on file as of 03/29/2021.   Star Rating Drugs: Rosuvastatin 5 mg last filled on 02/18/2021 [facility]  Called patient and discussed medication adherence  with patient, no issues at this time  with current medication.   Patient denies ED visit since her last CPP follow up.  Patient denies any side effects with her medication. Patient denies any problems with her current pharmacy  Per Clinical Pharmacist, patient Insomnia- is she still taking melatonin 5 mg ?Have symptoms improved?     Patient states melatonin does not seem to be helping.Patient reports she will have nights where she is awake until 5 am.  [place] Pharmacist Assistant [phone]

## 2021-04-06 NOTE — Discharge Instructions (Signed)
Instructions after Total Hip Replacement     Lillan Mccreadie P. Salvador Coupe, Jr., M.D.     Dept. of Orthopaedics & Sports Medicine  [facility]  [address]  [place], [place]  [address]  Phone: [phone]   Fax: [phone]    DIET: . Drink plenty of non-alcoholic fluids. . Resume your normal diet. Include foods high in fiber.  ACTIVITY:  . You may use crutches or a walker with weight-bearing as tolerated, unless instructed otherwise. . You may be weaned off of the walker or crutches by your Physical Therapist.  . Do NOT reach below the level of your knees or cross your legs until allowed.    . Continue doing gentle exercises. Exercising will reduce the pain and swelling, increase motion, and prevent muscle weakness.   . Please continue to use the TED compression stockings for 6 weeks. You may remove the stockings at night, but should reapply them in the morning. . Do not drive or operate any equipment until instructed.  WOUND CARE:  . Continue to use ice packs periodically to reduce pain and swelling. . Keep the incision clean and dry. . You may bathe or shower after the staples are removed at the first office visit following surgery.  MEDICATIONS: . You may resume your regular medications. . Please take the pain medication as prescribed on the medication. . Do not take pain medication on an empty stomach. . You have been given a prescription for a blood thinner to prevent blood clots. Please take the medication as instructed. (NOTE: After completing a 2 week course of Lovenox, take one Enteric-coated aspirin once a day.) . Pain medications and iron supplements can cause constipation. Use a stool softener (Senokot or Colace) on a daily basis and a laxative (dulcolax or miralax) as needed. . Do not drive or drink alcoholic beverages when taking pain medications.  CALL THE OFFICE FOR: . Temperature above 101 degrees . Excessive bleeding or drainage on the dressing. . Excessive  swelling, coldness, or paleness of the toes. . Persistent nausea and vomiting.  FOLLOW-UP:  . You should have an appointment to return to the office in 6 weeks after surgery. . Arrangements have been made for continuation of Physical Therapy (either home therapy or outpatient therapy).     [facility] Department Directory         [email or web address]       [email or web address]          Cardiology  Appointments: [place] - [phone] [place] - [phone]  Endocrinology  Appointments: [place] - [phone] [place] - [phone]  Gastroenterology  Appointments: [place] - [phone] [place] - [phone]        General Surgery   Appointments: [place] - [phone]  Internal Medicine/Family Medicine  Appointments: [place] - [phone] [place] - [phone] [place] - [phone]  Metabolic and Weigh Loss Surgery  Appointments: [place] - [phone]        Neurology  Appointments: [place] - [phone] [place] - [phone]  Neurosurgery  Appointments: [place] - [phone]  Obstetrics & Gynecology  Appointments: [place] - [phone] [place] - [phone]        Pediatrics  Appointments: [place] - [phone] [place] - [phone]  Physiatry  Appointments: [place] -[phone]  Physical Therapy  Appointments: [place] - [phone] [place] - [phone]        Podiatry  Appointments: [place] - [phone] [place] - [phone]  Pulmonology  Appointments: [place] - [phone]  Rheumatology  Appointments: [place] - [phone]         Location: [facility]  [address] [place], [place]  [address]  [place] Location: [facility] [address] [place], [place]  [address]  [place] Location: [facility] [address] [place], [place]  [address]    

## 2021-04-09 DIAGNOSIS — L409 Psoriasis, unspecified: Secondary | ICD-10-CM | POA: Diagnosis not present

## 2021-04-09 DIAGNOSIS — L4050 Arthropathic psoriasis, unspecified: Secondary | ICD-10-CM | POA: Diagnosis not present

## 2021-04-09 DIAGNOSIS — Z79899 Other long term (current) drug therapy: Secondary | ICD-10-CM | POA: Diagnosis not present

## 2021-04-09 DIAGNOSIS — M159 Polyosteoarthritis, unspecified: Secondary | ICD-10-CM | POA: Diagnosis not present

## 2021-04-18 ENCOUNTER — Other Ambulatory Visit: Admission: RE | Admit: 2021-04-18 | Payer: Medicare HMO | Source: Ambulatory Visit

## 2021-04-25 ENCOUNTER — Other Ambulatory Visit: Admission: RE | Admit: 2021-04-25 | Payer: Medicare HMO | Source: Ambulatory Visit

## 2021-04-29 ENCOUNTER — Ambulatory Visit: Admit: 2021-04-29 | Payer: Medicare HMO | Admitting: Orthopedic Surgery

## 2021-04-29 SURGERY — ARTHROPLASTY, HIP, TOTAL,POSTERIOR APPROACH
Anesthesia: Choice | Site: Hip | Laterality: Right

## 2021-05-08 ENCOUNTER — Ambulatory Visit: Payer: Self-pay | Admitting: Family Medicine

## 2021-05-08 NOTE — Progress Notes (Deleted)
Established patient visit   Patient: Teresa [name]   DOB: [birth year]   80 y.o. Female  MRN: [id number] Visit Date: 05/08/2021  Today's healthcare provider: [name], MD   No chief complaint on file.  Subjective    HPI  ***  {Show patient history (optional):23778::" "}   Medications: Outpatient Medications Prior to Visit  Medication Sig  . acetaminophen (TYLENOL) 650 MG CR tablet Take 650 mg by mouth every 12 (twelve) hours.  Marland Kitchen apixaban (ELIQUIS) 5 MG TABS tablet Take 1 tablet (5 mg total) by mouth 2 (two) times daily.  . budesonide-formoterol (SYMBICORT) 160-4.5 MCG/ACT inhaler Inhale 2 puffs into the lungs 2 (two) times daily. (Patient taking differently: Inhale 2 puffs into the lungs 2 (two) times daily as needed (shortness of breath or wheezing).)  . cetirizine (ZYRTEC) 10 MG tablet Take 1 tablet (10 mg total) by mouth daily. (Patient taking differently: Take 10 mg by mouth daily as needed for allergies.)  . Cholecalciferol 25 MCG (1000 UT) capsule Take 1,000 Units by mouth daily.   Marland Kitchen donepezil (ARICEPT) 5 MG tablet Take 1 tablet (5 mg total) by mouth at bedtime. (Patient taking differently: Take 5 mg by mouth in the morning.)  . ENTRESTO 24-26 MG Take 1 tablet by mouth 2 (two) times daily.  . fluticasone (FLONASE) 50 MCG/ACT nasal spray Place 2 sprays into both nostrils daily as needed for allergies.  . furosemide (LASIX) 20 MG tablet TAKE 1 TABLET (20 MG TOTAL) BY MOUTH DAILY AS NEEDED (SHORTNESS OF BREATH).  Marland Kitchen gabapentin (NEURONTIN) 300 MG capsule Take two tablets by mouth in the AM, two tablets by mouth at bedtime, and 1 tablet at dinner as needed. (Patient taking differently: Take 600 mg by mouth 2 (two) times daily.)  . HYDROcodone-acetaminophen (NORCO/VICODIN) 5-325 MG tablet Take 1 tablet by mouth every 6 (six) hours as needed for moderate pain.  Marland Kitchen lidocaine (LIDODERM) 5 % Place 1 patch onto the skin every 12 (twelve) hours. Remove & Discard patch within  12 hours or as directed by MD (Patient taking differently: Place 1 patch onto the skin daily as needed (pain). Remove & Discard patch within 12 hours or as directed by MD)  . metoprolol succinate (TOPROL-XL) 50 MG 24 hr tablet TAKE 1 TABLET (50 MG TOTAL) BY MOUTH 2 (TWO) TIMES DAILY (Patient taking differently: Take 50 mg by mouth in the morning.)  . montelukast (SINGULAIR) 10 MG tablet Take 10 mg by mouth daily as needed (for allergies).   . Multiple Vitamins-Minerals (MULTIVITAMIN PO) Take 1 tablet by mouth daily.  Marland Kitchen OTEZLA 30 MG TABS Take 30 mg by mouth in the morning and at bedtime.  . rosuvastatin (CRESTOR) 5 MG tablet TAKE 1 TABLET BY MOUTH EVERY DAY (Patient taking differently: Take 5 mg by mouth daily.)  . traMADol (ULTRAM) 50 MG tablet Take 50 mg by mouth every 6 (six) hours as needed for moderate pain.  Marland Kitchen venlafaxine (EFFEXOR) 75 MG tablet TAKE 2 TABLETS (150 MG TOTAL) BY MOUTH DAILY. (Patient taking differently: Take 75 mg by mouth at bedtime.)   No facility-administered medications prior to visit.    Review of Systems  Constitutional: Negative for appetite change, chills, fatigue and fever.  Respiratory: Negative for chest tightness and shortness of breath.   Cardiovascular: Negative for chest pain and palpitations.  Gastrointestinal: Negative for abdominal pain, nausea and vomiting.  Neurological: Negative for dizziness and weakness.    {Labs  Heme  Chem  Endocrine  Serology  Results Review (optional):23779::" "}   Objective    There were no vitals taken for this visit. {Show previous vital signs (optional):23777::" "}   Physical Exam  ***  No results found for any visits on 05/08/21.  Assessment & Plan     ***  No follow-ups on file.      {provider attestation***:1}   [name], MD  [facility] [phone] (phone) [phone] (fax)  [place]

## 2021-05-19 ENCOUNTER — Other Ambulatory Visit: Payer: Self-pay | Admitting: Family Medicine

## 2021-05-19 NOTE — Telephone Encounter (Signed)
Requested Prescriptions  Pending Prescriptions Disp Refills  . donepezil (ARICEPT) 5 MG tablet [Pharmacy Med Name: DONEPEZIL HCL 5 MG TABLET] 90 tablet 0    Sig: TAKE 1 TABLET BY MOUTH EVERYDAY AT BEDTIME     Neurology:  Alzheimer's Agents Passed - 05/19/2021  8:50 AM      Passed - Valid encounter within last 6 months    Recent Outpatient Visits          5 months ago Hyperglycemia   [facility] Jerrol Banana., MD   7 months ago Dog bite of right lower leg with infection, initial encounter   [facility] Flinchum, Kelby Aline, FNP   8 months ago Encounter for annual physical exam   [facility] Jerrol Banana., MD   11 months ago Neuropathy   [facility] Jerrol Banana., MD   1 year ago Neuropathy   [facility] Jerrol Banana., MD

## 2021-05-30 DIAGNOSIS — Z85828 Personal history of other malignant neoplasm of skin: Secondary | ICD-10-CM | POA: Diagnosis not present

## 2021-05-30 DIAGNOSIS — X32XXXA Exposure to sunlight, initial encounter: Secondary | ICD-10-CM | POA: Diagnosis not present

## 2021-05-30 DIAGNOSIS — L570 Actinic keratosis: Secondary | ICD-10-CM | POA: Diagnosis not present

## 2021-05-30 DIAGNOSIS — D485 Neoplasm of uncertain behavior of skin: Secondary | ICD-10-CM | POA: Diagnosis not present

## 2021-05-30 DIAGNOSIS — D2261 Melanocytic nevi of right upper limb, including shoulder: Secondary | ICD-10-CM | POA: Diagnosis not present

## 2021-05-30 DIAGNOSIS — D2262 Melanocytic nevi of left upper limb, including shoulder: Secondary | ICD-10-CM | POA: Diagnosis not present

## 2021-05-30 DIAGNOSIS — L565 Disseminated superficial actinic porokeratosis (DSAP): Secondary | ICD-10-CM | POA: Diagnosis not present

## 2021-05-30 DIAGNOSIS — D0471 Carcinoma in situ of skin of right lower limb, including hip: Secondary | ICD-10-CM | POA: Diagnosis not present

## 2021-05-30 DIAGNOSIS — D2271 Melanocytic nevi of right lower limb, including hip: Secondary | ICD-10-CM | POA: Diagnosis not present

## 2021-05-31 ENCOUNTER — Telehealth: Payer: Self-pay

## 2021-05-31 DIAGNOSIS — I509 Heart failure, unspecified: Secondary | ICD-10-CM | POA: Diagnosis not present

## 2021-05-31 DIAGNOSIS — Z23 Encounter for immunization: Secondary | ICD-10-CM | POA: Diagnosis not present

## 2021-05-31 DIAGNOSIS — R5383 Other fatigue: Secondary | ICD-10-CM | POA: Diagnosis not present

## 2021-05-31 DIAGNOSIS — I252 Old myocardial infarction: Secondary | ICD-10-CM | POA: Diagnosis not present

## 2021-05-31 DIAGNOSIS — I110 Hypertensive heart disease with heart failure: Secondary | ICD-10-CM | POA: Diagnosis not present

## 2021-05-31 DIAGNOSIS — I5022 Chronic systolic (congestive) heart failure: Secondary | ICD-10-CM | POA: Diagnosis not present

## 2021-05-31 DIAGNOSIS — Z951 Presence of aortocoronary bypass graft: Secondary | ICD-10-CM | POA: Diagnosis not present

## 2021-05-31 DIAGNOSIS — I480 Paroxysmal atrial fibrillation: Secondary | ICD-10-CM | POA: Diagnosis not present

## 2021-05-31 DIAGNOSIS — I255 Ischemic cardiomyopathy: Secondary | ICD-10-CM | POA: Diagnosis not present

## 2021-05-31 DIAGNOSIS — G4733 Obstructive sleep apnea (adult) (pediatric): Secondary | ICD-10-CM | POA: Diagnosis not present

## 2021-05-31 DIAGNOSIS — I081 Rheumatic disorders of both mitral and tricuspid valves: Secondary | ICD-10-CM | POA: Diagnosis not present

## 2021-05-31 NOTE — Progress Notes (Signed)
Spoke to patient daughter to confirmed patient telephone appointment on 06/03/2021 for CCM at 3:00 pm with [name] the Clinical pharmacist.   Patient daughter states she needs to reschedule appointment because she has another appointment at that time.Reschedule telephone appointment to 06/04/2021 at 3:30 pm with clinical pharmacist.Please call patient daughter at [phone] for appointment reminder and for appointment.Sent message to clinical pharmacist.  Star Rating Drug: Rosuvastatin 5 mg last filled on 05/19/2021 for 90 day supply at [facility].  [place] Pharmacist Assistant [phone]

## 2021-06-03 ENCOUNTER — Telehealth: Payer: Self-pay

## 2021-06-03 DIAGNOSIS — M1611 Unilateral primary osteoarthritis, right hip: Secondary | ICD-10-CM | POA: Diagnosis not present

## 2021-06-03 NOTE — Progress Notes (Signed)
Spoke to patient daughter to confirmed patient telephone appointment on 06/04/2021 for CCM at 3:30 pm with [name] the Clinical pharmacist.    Patient daughter aware to have all medications, supplements, blood pressure  logs to visit.  Questions: Have you had any recent office visit or specialist visit outside of [place]?No Are there any concerns you would like to discuss during your office visit? NO Are you having any problems obtaining your medications?NO Star Rating Drug: Rosuvastatin 5 mg last filled on 05/19/2021 for 90 day supply at [facility].  Any gaps in medications fill history? None ID  [place] Clinical Pharmacist Assistant [phone]

## 2021-06-04 ENCOUNTER — Ambulatory Visit (INDEPENDENT_AMBULATORY_CARE_PROVIDER_SITE_OTHER): Payer: Medicare HMO

## 2021-06-04 DIAGNOSIS — I4891 Unspecified atrial fibrillation: Secondary | ICD-10-CM

## 2021-06-04 DIAGNOSIS — I5022 Chronic systolic (congestive) heart failure: Secondary | ICD-10-CM

## 2021-06-04 NOTE — Progress Notes (Signed)
Chronic Care Management Pharmacy Note  06/04/2021 Name:  Teresa [name] MRN:  [id number] DOB:  08/24/1941  Summary: Today's visit was conducted entirely with patient's daughter, Teresa [name]. She reports her mother has been doing well, and is nervous regarding an upcoming surgery in 3-4 weeks. They had a bad experience at their neurologist, and were interested in a potential new neurology referral. She was also interested in trying to reduce her pill burden when possible.  Recommendations/Changes made from today's visit: Continue current medications  Plan: CPP follow-up in 2 months   Subjective: Teresa [name] is an 80 y.o. year old female who is a primary patient of Jerrol Banana., MD.  The CCM team was consulted for assistance with disease management and care coordination needs.    Engaged with patient by telephone for follow up visit in response to provider referral for pharmacy case management and/or care coordination services.   Consent to Services:  The patient was given information about Chronic Care Management services, agreed to services, and gave verbal consent prior to initiation of services.  Please see initial visit note for detailed documentation.   Patient Care Team: Jerrol Banana., MD as PCP - General (Family Medicine) Wellington Hampshire, MD as PCP - Cardiology (Cardiology) Dasher, Rayvon Char, MD as Consulting Physician (Dermatology) Blanche East, MD as Consulting Physician (Neurosurgery) Estevan Ryder, MD as Referring Physician (Cardiology) Troxler, Adele Schilder as Attending Physician (Podiatry) Germaine Pomfret, [facility] as Pharmacist (Pharmacist) Quintin Alto, MD as Consulting Physician (Rheumatology) Diamond Nickel, DO as Consulting Physician (Sports Medicine) Marchia Meiers, MD as Consulting Physician (Ophthalmology)  Recent office visits: 11/26/2020   Recent consult visits: 05/31/21: Patient presented to Dr. Ron Parker (Cardiology) for  follow-up. No medication chagnes made.  04/09/21: Patient presented to Dr. Posey Pronto (Rheumatology) for follow-up.  Hospital visits: None in previous 6 months   Objective:  Lab Results  Component Value Date   CREATININE 0.92 02/16/2019   BUN 11 02/16/2019   GFRNONAA 60 02/16/2019   GFRAA 69 02/16/2019   NA 144 02/16/2019   K 4.4 02/16/2019   CALCIUM 9.6 02/16/2019   CO2 23 02/16/2019   GLUCOSE 100 (H) 02/16/2019    Lab Results  Component Value Date/Time   HGBA1C 5.8 (A) 11/26/2020 01:33 PM   HGBA1C 5.8 (H) 05/30/2020 03:18 PM    Last diabetic Eye exam: No results found for: HMDIABEYEEXA  Last diabetic Foot exam: No results found for: HMDIABFOOTEX   Lab Results  Component Value Date   CHOL 132 02/16/2019   HDL 57 02/16/2019   LDLCALC 62 02/16/2019   TRIG 65 02/16/2019   CHOLHDL 2.3 02/16/2019    Hepatic Function Latest Ref Rng & Units 02/16/2019 10/21/2017 06/23/2017  Total Protein 6.0 - 8.5 g/dL 6.7 7.0 7.1  Albumin 3.7 - 4.7 g/dL 4.3 - 4.1  AST 0 - 40 IU/L '19 19 24  ' ALT 0 - 32 IU/L '17 15 18  ' Alk Phosphatase 39 - 117 IU/L 103 - 74  Total Bilirubin 0.0 - 1.2 mg/dL 0.6 0.6 0.6  Bilirubin, Direct 0.1 - 0.5 mg/dL - - 0.1    Lab Results  Component Value Date/Time   TSH 2.130 05/30/2020 03:18 PM   TSH 1.340 02/16/2019 03:30 PM   FREET4 0.93 01/13/2018 11:40 AM    CBC Latest Ref Rng & Units 05/30/2020 02/16/2019 11/12/2018  WBC 3.4 - 10.8 x10E3/uL 5.5 6.0 5.5  Hemoglobin 11.1 - 15.9 g/dL 14.0 14.3 12.2  Hematocrit 34.0 - 46.6 % 43.0 46.0 38.8  Platelets 150 - 450 x10E3/uL 230 334 233    No results found for: VD25OH  Clinical ASCVD: Yes  The ASCVD Risk score Mikey Bussing DC Jr., et al., 2013) failed to calculate for the following reasons:   The patient has a prior MI or stroke diagnosis    Depression screen [facility] 2/9 01/10/2021 01/10/2020 01/10/2020  Decreased Interest 0 0 0  Down, Depressed, Hopeless 0 0 0  PHQ - 2 Score 0 0 0  Altered sleeping - - -  Tired, decreased  energy - - -  Change in appetite - - -  Feeling bad or failure about yourself  - - -  Trouble concentrating - - -  Moving slowly or fidgety/restless - - -  Suicidal thoughts - - -  PHQ-9 Score - - -  Difficult doing work/chores - - -  Some recent data might be hidden    Social History   Tobacco Use  Smoking Status Former   Packs/day: 0.50   Years: 35.00   Pack years: 17.50   Types: Cigarettes  Smokeless Tobacco Never  Tobacco Comments   Quit approx 1990   BP Readings from Last 3 Encounters:  02/22/21 112/64  11/26/20 (!) 122/52  10/11/20 (!) 138/45   Pulse Readings from Last 3 Encounters:  02/22/21 (S) (!) 50  11/26/20 (!) 58  10/11/20 (!) 54   Wt Readings from Last 3 Encounters:  02/22/21 171 lb 1.2 oz (77.6 kg)  11/26/20 171 lb (77.6 kg)  10/11/20 176 lb 12.8 oz (80.2 kg)   BMI Readings from Last 3 Encounters:  02/22/21 29.37 kg/m  11/26/20 29.35 kg/m  10/11/20 30.35 kg/m    Assessment/Interventions: Review of patient past medical history, allergies, medications, health status, including review of consultants reports, laboratory and other test data, was performed as part of comprehensive evaluation and provision of chronic care management services.   SDOH:  (Social Determinants of Health) assessments and interventions performed: Yes  SDOH Screenings   Alcohol Screen: Low Risk    Last Alcohol Screening Score (AUDIT): 0  Depression (PHQ2-9): Low Risk    PHQ-2 Score: 0  Financial Resource Strain: Low Risk    Difficulty of Paying Living Expenses: Not hard at all  Food Insecurity: No Food Insecurity   Worried About Charity fundraiser in the Last Year: Never Teresa   Ran Out of Food in the Last Year: Never Teresa  Housing: Low Risk    Last Housing Risk Score: 0  Physical Activity: Inactive   Days of Exercise per Week: 0 days   Minutes of Exercise per Session: 0 min  Social Connections: Moderately Isolated   Frequency of Communication with Friends and  Family: More than three times a week   Frequency of Social Gatherings with Friends and Family: More than three times a week   Attends Religious Services: More than 4 times per year   Active Member of [employer] or Organizations: No   Attends Archivist Meetings: Never   Marital Status: Divorced  Stress: No Stress Concern Present   Feeling of Stress : Not at all  Tobacco Use: Medium Risk   Smoking Tobacco Use: Former   Smokeless Tobacco Use: Never  Transportation Needs: No Data processing manager (Medical): No   Lack of Transportation (Non-Medical): No    CCM Care Plan  Allergies  Allergen Reactions   Sulfa Antibiotics Hives   Ivp Dye [Iodinated  Diagnostic Agents] Swelling   Amiodarone Nausea And Vomiting and Cough   Codeine Other (See Comments)    ALTERED MENTAL STATUS    Medications Reviewed Today     Reviewed by Nat Christen, CPhT (Pharmacy Technician) on 04/12/21 at 1033  Med List Status: Complete   Medication Order Taking? Sig Documenting Provider Last Dose Status Informant  acetaminophen (TYLENOL) 650 MG CR tablet [id number] Yes Take 650 mg by mouth every 12 (twelve) hours. [provider]  Active Family Member  apixaban (ELIQUIS) 5 MG TABS tablet [id number] Yes Take 1 tablet (5 mg total) by mouth 2 (two) times daily. Wellington Hampshire, MD  Active Family Member  budesonide-formoterol [facility]) 160-4.5 MCG/ACT inhaler [id number] Yes Inhale 2 puffs into the lungs 2 (two) times daily.  Patient taking differently: Inhale 2 puffs into the lungs 2 (two) times daily as needed (shortness of breath or wheezing).   Jerrol Banana., MD  Active Family Member           Med Note Kellie Simmering, Jenene Slicker   Fri Apr 12, 2021 10:26 AM)    cetirizine (ZYRTEC) 10 MG tablet [id number] Yes Take 1 tablet (10 mg total) by mouth daily.  Patient taking differently: Take 10 mg by mouth daily as needed for allergies.   Jerrol Banana., MD  Active Family  Member           Med Note Kellie Simmering, Jenene Slicker   Fri Apr 12, 2021 10:26 AM)    Cholecalciferol 25 MCG (1000 UT) capsule [id number] Yes Take 1,000 Units by mouth daily.  [provider]  Active Family Member  donepezil (ARICEPT) 5 MG tablet [id number] Yes Take 1 tablet (5 mg total) by mouth at bedtime.  Patient taking differently: Take 5 mg by mouth in the morning.   Jerrol Banana., MD  Active Family Member  [place] 24-26 [place] [id number] Yes Take 1 tablet by mouth 2 (two) times daily. [provider]  Active Family Member  fluticasone (FLONASE) 50 MCG/ACT nasal spray [id number] Yes Place 2 sprays into both nostrils daily as needed for allergies. Jerrol Banana., MD  Active Family Member  furosemide (LASIX) 20 MG tablet [id number] Yes TAKE 1 TABLET (20 MG TOTAL) BY MOUTH DAILY AS NEEDED (SHORTNESS OF BREATH). Minna Merritts, MD  Active Family Member           Med Note Kellie Simmering, Jenene Slicker   Fri Apr 12, 2021 10:29 AM) Has not taken in the last year but still keeps on hand  gabapentin (NEURONTIN) 300 MG capsule [id number] Yes Take two tablets by mouth in the AM, two tablets by mouth at bedtime, and 1 tablet at dinner as needed.  Patient taking differently: Take 600 mg by mouth 2 (two) times daily.   Jerrol Banana., MD  Active Family Member  HYDROcodone-acetaminophen (NORCO/VICODIN) 5-325 MG tablet [id number] Yes Take 1 tablet by mouth every 6 (six) hours as needed for moderate pain. Sable Feil, PA-C  Active Family Member  lidocaine (LIDODERM) 5 % [id number] Yes Place 1 patch onto the skin every 12 (twelve) hours. Remove & Discard patch within 12 hours or as directed by MD  Patient taking differently: Place 1 patch onto the skin daily as needed (pain). Remove & Discard patch within 12 hours or as directed by MD   Sable Feil, PA-C  Active Family Member  Patient not taking:  Discontinued 04/12/21 1032 (Patient Preference)   metoprolol  succinate (TOPROL-XL) 50 MG 24  hr tablet [id number] Yes TAKE 1 TABLET (50 MG TOTAL) BY MOUTH 2 (TWO) TIMES DAILY  Patient taking differently: Take 50 mg by mouth in the morning.   Jerrol Banana., MD  Active Family Member  montelukast (SINGULAIR) 10 MG tablet [id number] Yes Take 10 mg by mouth daily as needed (for allergies).  [provider]  Active Family Member           Med Note Modena Nunnery, Ivar Drape   Fri Jan 08, 2018 11:59 AM)    Multiple Vitamins-Minerals (MULTIVITAMIN PO) [id number] Yes Take 1 tablet by mouth daily. [provider]  Active Family Member  OTEZLA 30 MG TABS [id number] Yes Take 30 mg by mouth in the morning and at bedtime. [provider]  Active Family Member  rosuvastatin (CRESTOR) 5 MG tablet [id number] Yes TAKE 1 TABLET BY MOUTH EVERY DAY  Patient taking differently: Take 5 mg by mouth daily.   Jerrol Banana., MD  Active Family Member  traMADol Veatrice Bourbon) 50 MG tablet [id number] Yes Take 50 mg by mouth every 6 (six) hours as needed for moderate pain. [provider]  Active Family Member  venlafaxine (EFFEXOR) 75 MG tablet [id number] Yes TAKE 2 TABLETS (150 MG TOTAL) BY MOUTH DAILY.  Patient taking differently: Take 75 mg by mouth at bedtime.   Jerrol Banana., MD  Active Family Member            Patient Active Problem List   Diagnosis Date Noted   Need for prophylactic vaccination against rabies 10/11/2020   Puncture wound 10/11/2020   Need for Tdap vaccination 10/11/2020   Dog bite of right lower leg with infection 10/11/2020   Neuropathy 05/02/2020   Chronic obstructive pulmonary disease ([place]) 03/11/2019   Atrial fibrillation ([place]) 11/11/2018   A-fib ([place]) 10/12/2018   Rheumatoid arthritis ([place]) 10/10/2018   Atrial fibrillation with RVR ([place]) 10/10/2018   Psoriatic arthritis ([place]) 03/04/2018   Unstable angina (HCC)    Atrial fibrillation with rapid ventricular response ([place]) 01/13/2018   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   Asthma  05/09/2017   Primary osteoarthritis of both first carpometacarpal joints 01/31/2016   Arthralgia of both hands 01/31/2016   Ventricular premature depolarization 09/11/2015   Psoriasis 07/16/2015   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   Hyperlipidemia 03/29/2015   Pre-syncope 03/04/2015   CAD (coronary artery disease)    PVC's (premature ventricular contractions)    Ischemic cardiomyopathy    DDD (degenerative disc disease), lumbar 01/18/2015   Intervertebral disc disorder with radiculopathy of lumbar region 12/26/2014   L-S radiculopathy 07/03/2014   Breast CA ([place]) 06/28/2014   Arthritis, degenerative 06/28/2014   Acid reflux 03/08/2014   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    Immunization History  Administered Date(s) Administered   Fluad Quad(high Dose 65+) 09/12/2020   Influenza Whole 08/22/2018   Influenza, High Dose Seasonal PF 10/04/2015, 10/06/2016, 09/16/2017, 08/31/2018, 10/07/2019   Influenza-Unspecified 07/22/2014   Moderna Sars-Covid-2 Vaccination 02/02/2020, 03/01/2020, 11/09/2020   PFIZER(Purple Top)SARS-COV-2 Vaccination 05/31/2021   Pneumococcal Conjugate-13 10/06/2016   Pneumococcal Polysaccharide-23 11/13/2009   Tdap 10/11/2020    Conditions to be addressed/monitored:  Hypertension, Atrial Fibrillation, Heart Failure, Coronary Artery Disease, GERD, COPD, Asthma, Osteoarthritis, and Psoriatic Arthritis and Insomnia  There are no care plans that you recently modified to display for this patient.    Medication Assistance: None required.  Patient affirms current coverage meets needs.  Compliance/Adherence/Medication fill  history: Care Gaps: Shingrix  Star-Rating Drugs: Rosuvastatin 5 mg last filled on 05/19/2021 for 90 day supply at [facility].  Patient's [facility]  [id number] [address] [address] [place]Phone: [phone]: [phone] [id number]  [place] [address]one: [phone]: [id number] [address]: [phone]: [phone] pill box? Yes Pt endorses 100% compliance  We discussed: Current pharmacy is preferred with insurance plan and patient is satisfied with pharmacy services Patient decided to: Continue current medication management strategy  Care Plan and Follow Up Patient Decision:  Patient agrees to Care Plan and Follow-up.  Plan: Telephone follow up appointment with care management team member scheduled for:  07/31/2021 at 12:30 PM   AleJunius ArgyleharmD, CPPCedar Hills6212 [phone]

## 2021-06-17 ENCOUNTER — Other Ambulatory Visit: Payer: Self-pay | Admitting: Orthopedic Surgery

## 2021-07-02 ENCOUNTER — Other Ambulatory Visit: Payer: Self-pay

## 2021-07-02 ENCOUNTER — Other Ambulatory Visit: Payer: Medicare HMO

## 2021-07-02 ENCOUNTER — Encounter
Admission: RE | Admit: 2021-07-02 | Discharge: 2021-07-02 | Disposition: A | Discharge status: Home / Self Care | Payer: Medicare HMO | Source: Ambulatory Visit | Attending: Orthopedic Surgery | Admitting: Orthopedic Surgery

## 2021-07-02 DIAGNOSIS — Z01812 Encounter for preprocedural laboratory examination: Secondary | ICD-10-CM | POA: Insufficient documentation

## 2021-07-02 HISTORY — DX: Other intervertebral disc degeneration, lumbar region: M51.36

## 2021-07-02 HISTORY — DX: Nausea with vomiting, unspecified: Z98.890

## 2021-07-02 HISTORY — DX: Unspecified osteoarthritis, unspecified site: M19.90

## 2021-07-02 HISTORY — DX: Other intervertebral disc degeneration, lumbar region without mention of lumbar back pain or lower extremity pain: M51.369

## 2021-07-02 HISTORY — DX: Nausea with vomiting, unspecified: R11.2

## 2021-07-02 LAB — COMPREHENSIVE METABOLIC PANEL
ALT: 15 U/L (ref 0–44)
AST: 22 U/L (ref 15–41)
Albumin: 3.8 g/dL (ref 3.5–5.0)
Alkaline Phosphatase: 64 U/L (ref 38–126)
Anion gap: 4 — ABNORMAL LOW (ref 5–15)
BUN: 14 mg/dL (ref 8–23)
CO2: 29 mmol/L (ref 22–32)
Calcium: 9 mg/dL (ref 8.9–10.3)
Chloride: 108 mmol/L (ref 98–111)
Creatinine, Ser: 0.82 mg/dL (ref 0.44–1.00)
GFR, Estimated: >60 mL/min (ref >60)
Glucose, Bld: 108 mg/dL — ABNORMAL HIGH (ref 70–99)
Potassium: 4.1 mmol/L (ref 3.5–5.1)
Sodium: 141 mmol/L (ref 135–145)
Total Bilirubin: 0.7 mg/dL (ref 0.3–1.2)
Total Protein: 6.7 g/dL (ref 6.5–8.1)

## 2021-07-02 LAB — CBC WITH DIFFERENTIAL/PLATELET
Abs Immature Granulocytes: 0.01 10*3/uL (ref 0.00–0.07)
Basophils Absolute: 0.1 10*3/uL (ref 0.0–0.1)
Basophils Relative: 1 %
Eosinophils Absolute: 0.4 10*3/uL (ref 0.0–0.5)
Eosinophils Relative: 7 %
HCT: 40.7 % (ref 36.0–46.0)
Hemoglobin: 13.4 g/dL (ref 12.0–15.0)
Immature Granulocytes: 0 %
Lymphocytes Relative: 36 %
Lymphs Abs: 1.8 10*3/uL (ref 0.7–4.0)
MCH: 31.5 pg (ref 26.0–34.0)
MCHC: 32.9 g/dL (ref 30.0–36.0)
MCV: 95.8 fL (ref 80.0–100.0)
Monocytes Absolute: 0.5 10*3/uL (ref 0.1–1.0)
Monocytes Relative: 9 %
Neutro Abs: 2.4 10*3/uL (ref 1.7–7.7)
Neutrophils Relative %: 47 %
Platelets: 202 10*3/uL (ref 150–400)
RBC: 4.25 MIL/uL (ref 3.87–5.11)
RDW: 13.6 % (ref 11.5–15.5)
WBC: 5.1 10*3/uL (ref 4.0–10.5)
nRBC: 0 % (ref 0.0–0.2)

## 2021-07-02 LAB — URINALYSIS, ROUTINE W REFLEX MICROSCOPIC
Bilirubin Urine: NEGATIVE
Glucose, UA: NEGATIVE mg/dL
Ketones, ur: NEGATIVE mg/dL
Leukocytes,Ua: NEGATIVE
Nitrite: NEGATIVE
Protein, ur: NEGATIVE mg/dL
Specific Gravity, Urine: 1.012 (ref 1.005–1.030)
pH: 5 (ref 5.0–8.0)

## 2021-07-02 LAB — TYPE AND SCREEN
ABO/RH(D): A POS
Antibody Screen: NEGATIVE

## 2021-07-02 LAB — SURGICAL PCR SCREEN
MRSA, PCR: NEGATIVE
Staphylococcus aureus: NEGATIVE

## 2021-07-02 NOTE — Patient Instructions (Signed)
Visit Information It was great speaking with you today!  Please let me know if you have any questions about our visit.   Goals Addressed             This Visit's Progress    Track and Manage Fluids and Swelling-Heart Failure   On track    Timeframe:  Long-Range Goal Priority:  High Start Date:  02/11/2021                           Expected End Date: 08/11/2021                      Follow Up Date 07/31/2021    - call office if I gain more than 2 pounds in one day or 5 pounds in one week    Why is this important?   It is important to check your weight daily and watch how much salt and liquids you have.  It will help you to manage your heart failure.    Notes:          Patient Care Plan: General Pharmacy (Adult)     Problem Identified: Hyperlipidemia, Atrial Fibrillation, Heart Failure, Coronary Artery Disease, GERD, COPD, Asthma, Osteoarthritis and Psoriatic Arthritis and Insomnia   Priority: High     Goal: Patient-Specific Goal   Start Date: 02/11/2021  Expected End Date: 08/11/2021  Recent Progress: On track  Priority: High  Note:   Current Barriers:  No barriers noted  Pharmacist Clinical Goal(s):  Patient will maintain control of heart failure as evidenced by stable weight; no heart failure exacerbations  through collaboration with PharmD and provider.   Interventions: 1:1 collaboration with Jerrol Banana., MD regarding development and update of comprehensive plan of care as evidenced by provider attestation and co-signature Inter-disciplinary care team collaboration (see longitudinal plan of care) Comprehensive medication review performed; medication list updated in electronic medical record  Atrial Fibrillation (Goal: prevent stroke and major bleeding) -Controlled -Current treatment: Rate control: Metoprolol XL 50 mg 1/2 tablet twice daily  Anticoagulation: Eliquis 5 mg twice daily  -Medications previously tried: NA -Counseled on avoidance of NSAIDs  due to increased bleeding risk with anticoagulants; -Recommended to continue current medication  Heart Failure (Goal: manage symptoms and prevent exacerbations) -Controlled -Last ejection fraction: 67% -HF type: Systolic -NYHA Class: II (slight limitation of activity) -AHA HF Stage: C (Heart disease and symptoms present) -Current treatment: Furosemide 20 mg daily as needed  Metoprolol XL 50 mg 1/2 tablet twice daily  Entresto 24-26 mg twice daily  -Medications previously tried: NA  -Educated on Importance of weighing daily; if you gain more than 3 pounds in one day or 5 pounds in one week, contact provider  -Recommended to continue current medication  Asthma (Goal: control symptoms and prevent exacerbations) -Controlled -Current treatment  Symbicort 2 puffs twice daily  Montelukast 10 mg daily as needed  -Current allergy treatment  Cetirizine 10 mg daily as needed  Flonase 50 mcg/act 2 sprays daily as needed  -Medications previously tried: NA  -Asthma allergy driven, seaosonal symptoms improved -Exacerbations requiring treatment in last 6 months: None -Patient reports consistent use of maintenance inhaler -Frequency of rescue inhaler use: Sporadic -Counseled on Benefits of consistent maintenance inhaler use -Recommended to continue current medication  Patient Goals/Self-Care Activities Patient will:  - check blood pressure 2-3 times weekly, document, and provide at future appointments weigh daily, and contact provider if weight gain  of greater than 2 pounds  Follow Up Plan: Telephone follow up appointment with care management team member scheduled for:  07/31/2021 at 12:30 PM       Patient agreed to services and verbal consent obtained.   Patient verbalizes understanding of instructions provided today and agrees to view in [place].   [name], PharmD, [place] [phone]

## 2021-07-02 NOTE — Pre-Procedure Instructions (Signed)
Copy and pasted note below from "care everywhere":  Telephone Encounter - Ephriam Jenkins, [place] - 06/21/2021 4:09 PM EDT Formatting of this note might be different from the original. I called the patient's daughter back, per Dr. Ron Parker last note she was cleared for surgery with Dr. Marry Guan and asked to hold Eliquis for three days. I have faxed another form to Dr. Ron Parker for our records. She had no further questions. Electronically signed by Ephriam Jenkins, [place] at 06/21/2021 4:14 PM EDT

## 2021-07-02 NOTE — Patient Instructions (Addendum)
Your procedure is scheduled on: Thursday, July 21 Report to the Registration Desk on the 1st floor of the [facility]. To find out your arrival time, please call [phone] between 1PM - 3PM on: Wednesday, July 20  REMEMBER: Instructions that are not followed completely may result in serious medical risk, up to and including death; or upon the discretion of your surgeon and anesthesiologist your surgery may need to be rescheduled.  Do not eat food after midnight the night before surgery.  No gum chewing, lozengers or hard candies.  You may however, drink CLEAR liquids up to 2 hours before you are scheduled to arrive for your surgery. Do not drink anything within 2 hours of your scheduled arrival time.  Clear liquids include: - water  - apple juice without pulp - gatorade (not RED, PURPLE, OR BLUE) - black coffee or tea (Do NOT add milk or creamers to the coffee or tea) Do NOT drink anything that is not on this list.  In addition, your doctor has ordered for you to drink the provided  Ensure Pre-Surgery Clear Carbohydrate Drink  Drinking this carbohydrate drink up to two hours before surgery helps to reduce insulin resistance and improve patient outcomes. Please complete drinking 2 hours prior to scheduled arrival time.  TAKE THESE MEDICATIONS THE MORNING OF SURGERY WITH A SIP OF WATER:  Symbicort inhaler Gabapentin Metoprolol Rosuvastatin  Use inhalers on the day of surgery and bring to the hospital.  Follow recommendations from Cardiologist, Pulmonologist or PCP regarding stopping Eliquis. Per telephone note record; patient to stop Eliquis 3 days prior to surgery. Last day to take is Sunday, July 17. Resume AFTER surgery per surgeons instruction.  One week prior to surgery: starting July 14 Stop Anti-inflammatories (NSAIDS) such as Advil, Aleve, Ibuprofen, Motrin, Naproxen, Naprosyn and Aspirin based products such as Excedrin, Goodys Powder, BC Powder. Stop ANY OVER THE  COUNTER supplements until after surgery. Stop multi-vitamins. You may however, continue to take Tylenol if needed for pain up until the day of surgery.  No Alcohol for 24 hours before or after surgery.  No Smoking including e-cigarettes for 24 hours prior to surgery.  No chewable tobacco products for at least 6 hours prior to surgery.  No nicotine patches on the day of surgery.  Do not use any "recreational" drugs for at least a week prior to your surgery.  Please be advised that the combination of cocaine and anesthesia may have negative outcomes, up to and including death. If you test positive for cocaine, your surgery will be cancelled.  On the morning of surgery brush your teeth with toothpaste and water, you may rinse your mouth with mouthwash if you wish. Do not swallow any toothpaste or mouthwash.  Do not wear jewelry, make-up, hairpins, clips or nail polish.  Do not wear lotions, powders, or perfumes.   Do not shave body from the neck down 48 hours prior to surgery just in case you cut yourself which could leave a site for infection.  Also, freshly shaved skin may become irritated if using the CHG soap.  Contact lenses, hearing aids and dentures may not be worn into surgery.  Do not bring valuables to the hospital. [facility] is not responsible for any missing/lost belongings or valuables.   Use CHG Soap as directed on instruction sheet.  Notify your doctor if there is any change in your medical condition (cold, fever, infection).  Wear comfortable clothing (specific to your surgery type) to the hospital.  After surgery, you can help prevent lung complications by doing breathing exercises.  Take deep breaths and cough every 1-2 hours. Your doctor may order a device called an Incentive Spirometer to help you take deep breaths.  If you are being admitted to the hospital overnight, leave your suitcase in the car. After surgery it may be brought to your room.  If you are  being discharged the day of surgery, you will not be allowed to drive home. You will need a responsible adult (18 years or older) to drive you home and stay with you that night.   If you are taking public transportation, you will need to have a responsible adult (18 years or older) with you. Please confirm with your physician that it is acceptable to use public transportation.   Please call the [place] [facility]. at [phone] if you have any questions about these instructions.  Surgery Visitation Policy:  Patients undergoing a surgery or procedure may have one family member or support person with them as long as that person is not COVID-19 positive or experiencing its symptoms.  That person may remain in the waiting area during the procedure.  Inpatient Visitation:    Visiting hours are 7 a.m. to 8 p.m. Inpatients will be allowed two visitors daily. The visitors may change each day during the patient's stay. No visitors under the age of 12. Any visitor under the age of 64 must be accompanied by an adult. The visitor must pass COVID-19 screenings, use hand sanitizer when entering and exiting the patient's room and wear a mask at all times, including in the patient's room. Patients must also wear a mask when staff or their visitor are in the room. Masking is required regardless of vaccination status.

## 2021-07-04 NOTE — Progress Notes (Signed)
  [facility] Perioperative Services: Pre-Admission/Anesthesia Testing  Abnormal Lab Notification   Date: 07/04/21  Name: MARSHAE AZAM MRN:   [id number]  Re: Abnormal labs noted during PAT appointment   Provider(s) Notified: Hessie Knows, MD and Rachelle Hora, PA-C Notification mode: Routed and/or faxed via [place] LAB VALUE(S): Lab Results  Component Value Date   COLORURINE YELLOW (A) 07/02/2021   APPEARANCEUR CLEAR (A) 07/02/2021   LABSPEC 1.012 07/02/2021   PHURINE 5.0 07/02/2021   GLUCOSEU NEGATIVE 07/02/2021   HGBUR SMALL (A) 07/02/2021   BILIRUBINUR NEGATIVE 07/02/2021   KETONESUR NEGATIVE 07/02/2021   PROTEINUR NEGATIVE 07/02/2021   UROBILINOGEN 0.2 12/25/2016   NITRITE NEGATIVE 07/02/2021   LEUKOCYTESUR NEGATIVE 07/02/2021   EPIU 0-5 07/02/2021   WBCU 0-5 07/02/2021   RBCU 0-5 07/02/2021   BACTERIA MANY (A) 07/02/2021   Lab Results  Component Value Date   CULT >=100,000 COLONIES/mL ESCHERICHIA COLI (A) 07/02/2021   Notes:  Patient is scheduled for a TOTAL HIP ARTHROPLASTY ANTERIOR APPROACH (Right: Hip) on 07/11/2021.   UA performed in PAT concerning for infection.  No leukocytosis noted on CBC; WBC 5100 Renal function normal. Estimated Creatinine Clearance: 56.7 mL/min (by C-G formula based on SCr of 0.82 mg/dL). Urine C&S added to assess for pathogenically significant growth. (+) for E.Coli - sensitivities pending.   Will forward UA, and subsequent C&S results, to attending surgeon for review and Tx as deemed appropriate.   This is a Community education officer; no formal response is required.  Honor Loh, MSN, APRN, FNP-C, CEN [facility]  Peri-operative Services Nurse Practitioner Phone: [phone] Fax: [phone] 07/04/21 2:38 PM

## 2021-07-05 ENCOUNTER — Encounter: Payer: Self-pay | Admitting: Orthopedic Surgery

## 2021-07-05 LAB — URINE CULTURE: Culture: >=100000 — AB

## 2021-07-08 ENCOUNTER — Encounter: Payer: Self-pay | Admitting: Orthopedic Surgery

## 2021-07-08 NOTE — Progress Notes (Signed)
Perioperative Services  Pre-Admission/Anesthesia Testing Clinical Review  Date: 07/09/21  Patient Demographics:  Name: Teresa [name] DOB:   Mar 12, 1941 MRN:   [id number]  Planned Surgical Procedure(s):    Case: [id number] Date/Time: 07/11/21 0933   Procedure: TOTAL HIP ARTHROPLASTY ANTERIOR APPROACH (Right: Hip)   Anesthesia type: Choice   Pre-op diagnosis: Primary osteoarthritis of right hip M16.11   Location: [facility] OR ROOM 01 / [place] [facility] FOR AN[facility]   Surgeons: Hessie Knows, MD     NOTE: Available PAT nursing documentation and vital signs have been reviewed. Clinical nursing staff has updated patient's PMH/PSHx, current medication list, and drug allergies/intolerances to ensure comprehensive history available to assist in medical decision making as it pertains to the aforementioned surgical procedure and anticipated anesthetic course. Extensive review of available clinical information performed. [place] PMH and PSHx updated with any diagnoses/procedures that  may have been inadvertently omitted during her intake with the pre-admission testing department's nursing staff.  Clinical Discussion:  Teresa [name] is a 80 y.o. female who is submitted for pre-surgical anesthesia review and clearance prior to her undergoing the above procedure. Patient is a Former Smoker (17.5 pack years; quit 12/1989). Pertinent PMH includes: CAD (s/p CABG), MI, mixed ischemic/nonischemic cardiomyopathy, PAH, atrial fibrillation, PVCs, PSVT, LBBB, HTN, HLD, asthma, GERD (no daily Tx), LEFT breast cancer, OA, RA, DDD.    Patient is followed by cardiology Ron Parker, MD). She was last seen in the cardiology clinic on 05/31/2021; notes reviewed. At the time of her clinic visit, the patient denied any chest pain, shortness of breath, PND, orthopnea, palpitations, significant peripheral edema, vertiginous symptoms, or presyncope/syncope. She complained of chronic fatigue. Patient with a PMH significant for  cardiovascular diagnoses.  Patient suffered an MI leading to single vessel surgical revascularization (CABG) that was performed on 04/11/1993. LIMA-LAD bypass graft was placed. Post-procedural LVEF reduced at 40-45%.  Myocardial perfusion imagine study performed on 11/20/2017 revealing a reduced LVEF of 42%. There was a mild perfusion defect noted, however it was felt likely to be secondary to breast attenuation. LBBB and frequent PVCs noted.   TTE performed on 12/04/2017 demonstrated moderately reduced left ventricular systolic function; LVEF 80-99%. There were areas of myocardial hypokinesis observed. Additionally, there was mild to moderate mitral valve regurgitation and left atrial enlargement. PASP elevated at 53 mm/Hg.  Patient diagnosed with atrial fibrillation in 12/2017. She underwent DCCV procedure in 01/15/2018 which restored NSR. Started on oral amiodarone following initial DCCV. Medication had to be subsequently discontinued in 01/2018 due to associated nausea and anorexia. Underwent second DCCV procedure on 10/29/2018 which restored NSR. Patient taken to the ED on 11/11/2018 via EMS where she was found to be back in atrial fibrillation. DCCV x 2 attempts were unsuccessful in converting patient back to a sinus rhythm.   Diagnostic right/left heart catheterization performed on 01/14/2018 revealed mild non-obstructive CAD with a 20-30% stenosis in the LAD. The LIMA-LAD bypass graft was atretic. LVEDP mild elevated.   TTE on 04/06/2018 revealed a moderately reduced left ventricular systolic function with mild LVH; LVEF 40-45%. Doppler parameters consistent with G2DD. Mild to moderate MV and TV regurgitation observed. LA was moderately enlarged.   Long term cardiac event monitoring study performed on 04/06/2018  revealed a predominantly underlying NSR. There was frequent PVCs; 421 occurring in 24 hours. Additionally, there were 7 short runs of SVT that were felt to likely represent atrial  fibrillation.   Underwent a successful PVI ablation 05/27/2019 (see full interpretation of cardiac testing  and interventions below).   CHA2DS2-VASc Score = 6 (age x 2, sex, CHF, HTN, previous MI). Patient chronically anticoagulated using apixaban; compliant with therapy with no evidence of GI bleeding. Cardiomyopathy and blood pressure reasonably managed on prescribed ARB/ARNI, diuretic, and beta blocker therapies; blood pressure 142/48. Functional capacity limited by overall cardiovascular health, age related debility, and orthopedic pain; felt to be </= 4 METS.  No changes were made to patient's medication regimen.  Patient to follow-up with outpatient cardiology in 6 months or sooner if needed.  Patient is scheduled for an RIGHT total hip arthoplasty on 07/11/2021 with Dr. Hessie Knows, MD.  Given patient's past medical history significant for cardiovascular diagnoses, presurgical cardiac clearance was sought by the performing surgeon's office and PAT team. Per cardiology, "this patient is optimized for surgery and may proceed with the planned procedural course with a LOW risk stratification".  Again, this patient is on daily anticoagulation therapy. She has been instructed on recommendations for holding her apixaban for 3 days prior to her procedure with plans to restart as soon as postoperative bleeding risk felt to be minimized by her attending surgeon. The patient has been instructed that her last dose of her anticoagulant will be on 07/07/2021.  Patient reports previous perioperative complications with anesthesia in the past. Patient has a PMH (+) for PONV.  Order placed by PAT for patient to receive a prophylactic preoperative dose of aprepitant. In review of the available records, it is noted that patient underwent a general anesthetic course at [facility] ([facility]) in 05/2019 without documented complications.   Vitals with BMI 07/02/2021 02/22/2021 11/26/2020  Height 5\' 4"  5\' 4"   -  Weight 175 lbs 171 lbs 1 oz 171 lbs  BMI 38.93 73.42 -  Systolic 876 811 572  Diastolic 51 64 52  Pulse 60 50 58    Providers/Specialists:   NOTE: Primary physician provider listed below. Patient may have been seen by APP or partner within same practice.   PROVIDER ROLE / SPECIALTY LAST Fabio Bering, MD  Orthopedics (Surgeon)  06/03/2021  Jerrol Banana., MD  Primary Care Provider  11/26/2020  Levora Angel, MD  Cardiology  05/31/2021   Allergies:  Sulfa antibiotics, Ivp dye [iodinated diagnostic agents], Amiodarone, and Codeine  Current Home Medications:   No current facility-administered medications for this encounter.    acetaminophen (TYLENOL) 650 MG CR tablet   apixaban (ELIQUIS) 5 MG TABS tablet   budesonide-formoterol (SYMBICORT) 160-4.5 MCG/ACT inhaler   cetirizine (ZYRTEC) 10 MG tablet   donepezil (ARICEPT) 5 MG tablet   ENTRESTO 24-26 MG   fluticasone (FLONASE) 50 MCG/ACT nasal spray   furosemide (LASIX) 20 MG tablet   gabapentin (NEURONTIN) 300 MG capsule   metoprolol succinate (TOPROL-XL) 50 MG 24 hr tablet   Multiple Vitamins-Minerals (MULTIVITAMIN PO)   OTEZLA 30 MG TABS   rosuvastatin (CRESTOR) 5 MG tablet   traMADol (ULTRAM) 50 MG tablet   venlafaxine (EFFEXOR) 75 MG tablet   History:   Past Medical History:  Diagnosis Date   Arthritis    Asthma    Back pain    Breast cancer, left ([place]) 2009   CAD (coronary artery disease)    a. 1994 s/p CABG x 1 (LIMA->LAD); b. 03/2015 MV: No ischemia; c. MV 11/18: small fixed apical defect likely secondary to breast attenuation, EF of 42%, frequent PVCs; d. 12/2017 Cath: LM nl, LAD 20p, D1/2/3 nl, LCX min irregs, OM1/2/3 min irregs, RCA nl,  RPDA nl, RPL1/2 nl, LIMA->LAD atretic.   Chronic anticoagulation    Apixaban   Chronic combined systolic (congestive) and diastolic (congestive) heart failure ([place])    a. 2013 EF 40%;  b. 03/2015 Echo: EF 55-60%; c. 12/18 Echo: EF of 35-40%; d. 12/2017 TEE: EF 35-40%;  e. 03/2018 Echo: EF 40-45%; f. 09/2018 Echo: EF 35%.   DDD (degenerative disc disease), lumbar    Dental crowns present    caps- left back top, right back bottom   Gastroesophageal reflux disease    Hyperlipidemia    Hypertension    LBBB (left bundle branch block)    MI (myocardial infarction) ([place]) 1994   Mixed Ischemic & Nonischemic cardiomyopathy    a. 2013 EF 40%;  b. 03/2015 Echo: EF 55-60%; c. 12/18 Echo: EF of 35-40%, hypokinesis of the anteroseptal, and apical myocardium, mild to mod MR, mod dil LA, nl RV fxn, PASP 53 mmHg; d. 12/2017 TEE: EF 35-40%, diff HK, mild to mod MR. small PFO. No LAA/RAA thrombus; e. 03/2018 Echo: EF 40-45%, antsept/inf HK, Gr2 DD, mild MR, mod idl LA, mild to mod TR, PASP 35-85mmHg; f. 09/2018 Echo: EF 35%.   Osteoarthritis    left hip and knee   Persistent atrial fibrillation ([place])    a. diagnosed 01/13/2018; b. CHADS2VASc = 6 --> Eliquis; c. 12/2017 s/p TEE/DCCV. Amio started but d/c'd 01/2018 2/2 n/anorexia; d. 10/2018 DCCV-->recurrent Afib w/in days; e. 10/2018 DCCV x 2 in ED->persistent Afib.   Personal history of radiation therapy    PONV (postoperative nausea and vomiting)    Psoriasis    PSVT (paroxysmal supraventricular tachycardia) ([place])    a. 02/2015 Holter: short runs of SVT and NSVT.   Pulmonary hypertension (HCC)    PVC's (premature ventricular contractions)    a. 03/2018 24h Holter: Freq PVC's with a total of 421 beats in 24 hrs. 7 short runs of SVT likely representing Afib.   Rheumatoid arthritis ([place])    feet, hands   Vertigo    approx 2x/yr   Past Surgical History:  Procedure Laterality Date   ABDOMINAL HYSTERECTOMY     APPENDECTOMY     BREAST BIOPSY Right 1991   negative   BREAST EXCISIONAL BIOPSY Left 2009   positive   BREAST LUMPECTOMY Left 2009   CARDIAC CATHETERIZATION N/A 05/27/1988   Location: [facility]; Surgeon: Doristine Bosworth, MD   CARDIOVERSION N/A 01/15/2018   Procedure: CARDIOVERSION;  Surgeon: Wellington Hampshire, MD;   Location: [facility];  Service: Cardiovascular;  Laterality: N/A;   CARDIOVERSION N/A 10/29/2018   Procedure: CARDIOVERSION;  Surgeon: Minna Merritts, MD;  Location: [facility];  Service: Cardiovascular;  Laterality: N/A;   CARDIOVERSION N/A 11/11/2018   DCCV x 2 attempts at 120 J; Location: [facility]   CERVICAL FUSION  2019   CHOLECYSTECTOMY     COLONOSCOPY     CORONARY ARTERY BYPASS GRAFT  04/11/1993   1v; LIMA-LAD; Location: [facility]; Surgeon: Merrily Pew, MD   FOOT ARTHRODESIS Right 07/24/2016   Procedure: FUSION FIRST METATARSAL CUNEIFORM JOINT RIGHT FOOT, FUSION SECOND METATARSAL CUNEFORM JOINT BUNION REPAIR RIGHT FOOT;  Surgeon: Albertine Patricia, DPM;  Location: [facility];  Service: Podiatry;  Laterality: Right;   HARDWARE REMOVAL Right 07/24/2016   Procedure: REMOVAL HARDWARE LATERAL MALLEOUS RIGHT ANKLE;  Surgeon: Albertine Patricia, DPM;  Location: [facility] [place];  Service: Podiatry;  Laterality: Right;  REMOVAL OF PIN WHICH WAS INTACT   HERNIA REPAIR     ventral   KNEE  ARTHROSCOPY Left    LUMBAR FUSION     Pulmonary vein isolation (cardiac ablation) N/A 05/27/2019   Location: [facility]; Surgeon: Mikel Cella, MD   RIGHT/LEFT HEART CATH AND CORONARY/GRAFT ANGIOGRAPHY N/A 01/14/2018   Procedure: LEFT HEART CATH AND CORONARY ANGIOGRAPHY;  Surgeon: Wellington Hampshire, MD;  Location: [facility];  Service: Cardiovascular;  Laterality: N/A;   TEE WITHOUT CARDIOVERSION N/A 01/15/2018   Procedure: TRANSESOPHAGEAL ECHOCARDIOGRAM (TEE);  Surgeon: Wellington Hampshire, MD;  Location: [facility];  Service: Cardiovascular;  Laterality: N/A;   TONSILLECTOMY     TOTAL HIP ARTHROPLASTY Left    TOTAL KNEE ARTHROPLASTY Left 12/19/2013   TUBAL LIGATION     Family History  Problem Relation Age of Onset   Heart attack Father    Congestive Heart Failure Mother    Breast cancer Mother 95   Kidney failure Sister    Prostate cancer Brother    Bladder Cancer Brother    Lung cancer Sister     Arthritis Sister    Breast cancer Sister    Arthritis Sister    Breast cancer Sister    Lung cancer Sister    Lung cancer Brother    Arthritis Brother    Social History   Tobacco Use   Smoking status: Former    Packs/day: 0.50    Years: 35.00    Pack years: 17.50    Types: Cigarettes    Quit date: 1991    Years since quitting: 31.5   Smokeless tobacco: Never   Tobacco comments:    Quit approx Engineer, manufacturing systems Use: Never used  Substance Use Topics   Alcohol use: No   Drug use: No    Pertinent Clinical Results:  LABS: Labs reviewed: Acceptable for surgery.  No visits with results within 3 Day(s) from this visit.  Latest known visit with results is:  Hospital Outpatient Visit on 07/02/2021  Component Date Value Ref Range Status   WBC 07/02/2021 5.1  4.0 - 10.5 K/uL Final   RBC 07/02/2021 4.25  3.87 - 5.11 MIL/uL Final   Hemoglobin 07/02/2021 13.4  12.0 - 15.0 g/dL Final   HCT 07/02/2021 40.7  36.0 - 46.0 % Final   MCV 07/02/2021 95.8  80.0 - 100.0 fL Final   MCH 07/02/2021 31.5  26.0 - 34.0 pg Final   MCHC 07/02/2021 32.9  30.0 - 36.0 g/dL Final   RDW 07/02/2021 13.6  11.5 - 15.5 % Final   Platelets 07/02/2021 202  150 - 400 K/uL Final   nRBC 07/02/2021 0.0  0.0 - 0.2 % Final   Neutrophils Relative % 07/02/2021 47  % Final   Neutro Abs 07/02/2021 2.4  1.7 - 7.7 K/uL Final   Lymphocytes Relative 07/02/2021 36  % Final   Lymphs Abs 07/02/2021 1.8  0.7 - 4.0 K/uL Final   Monocytes Relative 07/02/2021 9  % Final   Monocytes Absolute 07/02/2021 0.5  0.1 - 1.0 K/uL Final   Eosinophils Relative 07/02/2021 7  % Final   Eosinophils Absolute 07/02/2021 0.4  0.0 - 0.5 K/uL Final   Basophils Relative 07/02/2021 1  % Final   Basophils Absolute 07/02/2021 0.1  0.0 - 0.1 K/uL Final   Immature Granulocytes 07/02/2021 0  % Final   Abs Immature Granulocytes 07/02/2021 0.01  0.00 - 0.07 K/uL Final   Performed at [facility], [address] [place],  [place] [address]   Sodium 07/02/2021 141  135 -  145 mmol/L Final   Potassium 07/02/2021 4.1  3.5 - 5.1 mmol/L Final   Chloride 07/02/2021 108  98 - 111 mmol/L Final   CO2 07/02/2021 29  22 - 32 mmol/L Final   Glucose, Bld 07/02/2021 108 (A) 70 - 99 mg/dL Final   Glucose reference range applies only to samples taken after fasting for at least 8 hours.   BUN 07/02/2021 14  8 - 23 mg/dL Final   Creatinine, Ser 07/02/2021 0.82  0.44 - 1.00 mg/dL Final   Calcium 07/02/2021 9.0  8.9 - 10.3 mg/dL Final   Total Protein 07/02/2021 6.7  6.5 - 8.1 g/dL Final   Albumin 07/02/2021 3.8  3.5 - 5.0 g/dL Final   AST 07/02/2021 22  15 - 41 U/L Final   ALT 07/02/2021 15  0 - 44 U/L Final   Alkaline Phosphatase 07/02/2021 64  38 - 126 U/L Final   Total Bilirubin 07/02/2021 0.7  0.3 - 1.2 mg/dL Final   GFR, Estimated 07/02/2021 >60  >60 mL/min Final   Comment: (NOTE) Calculated using the CKD-EPI Creatinine Equation (2021)    Anion gap 07/02/2021 4 (A) 5 - 15 Final   Performed at [facility], [place], [place] [address]   Color, Urine 07/02/2021 YELLOW (A) YELLOW Final   APPearance 07/02/2021 CLEAR (A) CLEAR Final   Specific Gravity, Urine 07/02/2021 1.012  1.005 - 1.030 Final   pH 07/02/2021 5.0  5.0 - 8.0 Final   Glucose, UA 07/02/2021 NEGATIVE  NEGATIVE mg/dL Final   Hgb urine dipstick 07/02/2021 SMALL (A) NEGATIVE Final   Bilirubin Urine 07/02/2021 NEGATIVE  NEGATIVE Final   Ketones, ur 07/02/2021 NEGATIVE  NEGATIVE mg/dL Final   Protein, ur 07/02/2021 NEGATIVE  NEGATIVE mg/dL Final   Nitrite 07/02/2021 NEGATIVE  NEGATIVE Final   Leukocytes,Ua 07/02/2021 NEGATIVE  NEGATIVE Final   RBC / HPF 07/02/2021 0-5  0 - 5 RBC/hpf Final   WBC, UA 07/02/2021 0-5  0 - 5 WBC/hpf Final   Bacteria, UA 07/02/2021 MANY (A) NONE SEEN Final   Squamous Epithelial / LPF 07/02/2021 0-5  0 - 5 Final   Mucus 07/02/2021 PRESENT   Final   Performed at [facility], [address]  [place], [place] [address]   ABO/RH(D) 07/02/2021 A POS   Final   Antibody Screen 07/02/2021 NEG   Final   Sample Expiration 07/02/2021 07/16/2021,2359   Final   Extend sample reason 07/02/2021    Final                   Value:NO TRANSFUSIONS OR PREGNANCY IN THE PAST 3 MONTHS Performed at [facility], [address] [place], [place] [address]    MRSA, PCR 07/02/2021 NEGATIVE  NEGATIVE Final   Staphylococcus aureus 07/02/2021 NEGATIVE  NEGATIVE Final   Comment: (NOTE) The Xpert SA Assay (FDA approved for NASAL specimens in patients 70 years of age and older), is one component of a comprehensive surveillance program. It is not intended to diagnose infection nor to guide or monitor treatment. Performed at [facility], [address] [place], [place] [address]     ECG: Date: 02/22/2021 Time ECG obtained: 1416 PM Rate: 53 bpm Rhythm:  Sinus bradycardia with first-degree AV block ; LBBB Axis (leads I and aVF): Left axis deviation Intervals: PR 234 ms. QRS 148 ms. QTc 469 ms. ST segment and T wave changes: No evidence of acute ST segment elevation or depression Comparison: Previous tracing  on 03/11/2019 showed atrial fibrillation   IMAGING / PROCEDURES: LONG TERM CARDIAC EVENT MONITOR STUDY performed on 04/06/2018 Predominantly underlying normal sinus rhythm Frequent PVCs with a total of 421 beats in 24 hours 7 short runs of SVT that likely represent atrial fibrillation  TRANSTHORACIC ECHOCARDIOGRAM performed on 04/06/2018 Mild to moderately reduced left ventricular systolic function with an EF of 40-45% Anteroseptal and inferoseptal myocardial hypokinesis Pseudo normal left ventricular filling pattern consistent with abnormal laxation and increased filling pressure (G2DD) Left ventricular cavity size normal Left ventricular wall thickness increased in a pattern of mild LVH Mild mitral valve regurgitation Moderate left atrial dilatation Right ventricular cavity  size normal Right ventricular systolic function normal A septal defect cannot be excluded Mild to moderate tricuspid valve regurgitation Mildly elevated PASP in the range of 35-40 mmHg  TRANSESOPHAGEAL ECHOCARDIOGRAM performed on 01/15/2018 Moderately reduced left ventricular systolic function with an EF of 35-40% Diffuse hypokinesis No evidence of thrombus Mild to moderate mitral regurgitation Left atrial dilatation with no evidence of thrombus in the LAA Right atrial dilatation with no evidence of thrombus in the atrial cavity Small stretched PFO.  Small bidirectional atrial level shunt at baseline or with provocation.  RIGHT/LEFT HEART CATHETERIZATION AND CORONARY ANGIOGRAPHY performed on 01/14/2018 Mild nonobstructive CAD 20-30% stenosis of the proximal LAD Atretic LIMA-LAD bypass graft Mildly elevated LVEDP   LEXISCAN performed on 11/20/2017 LVEF 42% There is a small defect of mild severity present in the apex location likely due to breast attenuation No convincing evidence of ischemia Nondiagnostic EKG due to LBBB Frequent PVCs noted Intermediate risk study mainly due to reduced EF  CORONARY ARTERY BYPASS GRAFTING PERFORMED ON 04/14/1993 Single-vessel CABG procedure LIMA-LAD bypass graft placed  Impression and Plan:  KANDEE ESCALANTE has been referred for pre-anesthesia review and clearance prior to her undergoing the planned anesthetic and procedural courses. Available labs, pertinent testing, and imaging results were personally reviewed by me. This patient has been appropriately cleared by cardiology with an overall LOW risk of significant perioperative cardiovascular complications.  Based on clinical review performed today (07/09/21), barring any significant acute changes in the patient's overall condition, it is anticipated that she will be able to proceed with the planned surgical intervention. Any acute changes in clinical condition may necessitate her procedure being  postponed and/or cancelled. Patient will meet with anesthesia team (MD and/or CRNA) on the day of her procedure for preoperative evaluation/assessment. Questions regarding anesthetic course will be fielded at that time.   Pre-surgical instructions were reviewed with the patient during her PAT appointment and questions were fielded by PAT clinical staff. Patient was advised that if any questions or concerns arise prior to her procedure then she should return a call to PAT and/or her surgeon's office to discuss.  Honor Loh, MSN, APRN, FNP-C, CEN [facility]  Peri-operative Services Nurse Practitioner Phone: [phone] Fax: [phone] 07/09/21 4:42 PM  NOTE: This note has been prepared using Dragon dictation software. Despite my best ability to proofread, there is always the potential that unintentional transcriptional errors may still occur from this process.

## 2021-07-09 ENCOUNTER — Other Ambulatory Visit: Payer: Medicare HMO

## 2021-07-10 ENCOUNTER — Other Ambulatory Visit: Payer: Self-pay

## 2021-07-10 ENCOUNTER — Other Ambulatory Visit
Admission: RE | Admit: 2021-07-10 | Discharge: 2021-07-10 | Disposition: A | Discharge status: Home / Self Care | Payer: Medicare HMO | Source: Ambulatory Visit | Attending: Orthopedic Surgery | Admitting: Orthopedic Surgery

## 2021-07-10 DIAGNOSIS — Z20822 Contact with and (suspected) exposure to covid-19: Secondary | ICD-10-CM | POA: Insufficient documentation

## 2021-07-10 DIAGNOSIS — Z01812 Encounter for preprocedural laboratory examination: Secondary | ICD-10-CM | POA: Insufficient documentation

## 2021-07-10 LAB — SARS CORONAVIRUS 2 (TAT 6-24 HRS): SARS Coronavirus 2: NEGATIVE

## 2021-07-10 MED ORDER — FAMOTIDINE 20 MG PO TABS: 20.0000 mg | ORAL_TABLET | Freq: Once | ORAL | Status: AC

## 2021-07-10 MED ORDER — LACTATED RINGERS IV SOLN: INTRAVENOUS | Status: DC

## 2021-07-10 MED ORDER — CHLORHEXIDINE GLUCONATE 0.12 % MT SOLN: 15.0000 mL | Freq: Once | OROMUCOSAL | Status: AC

## 2021-07-10 MED ORDER — CEFAZOLIN SODIUM-DEXTROSE 2-4 GM/100ML-% IV SOLN
2.0000 g | INTRAVENOUS | Status: AC
  Administered 2021-07-11: 2 g via INTRAVENOUS

## 2021-07-10 MED ORDER — APREPITANT 40 MG PO CAPS: 40.0000 mg | ORAL_CAPSULE | Freq: Once | ORAL | Status: AC

## 2021-07-10 MED ORDER — ORAL CARE MOUTH RINSE: 15.0000 mL | Freq: Once | OROMUCOSAL | Status: AC

## 2021-07-11 ENCOUNTER — Encounter
Admission: RE | Disposition: A | Discharge status: Home / Self Care | Payer: Self-pay | Source: Home / Self Care | Attending: Orthopedic Surgery

## 2021-07-11 ENCOUNTER — Encounter: Payer: Self-pay | Admitting: Orthopedic Surgery

## 2021-07-11 ENCOUNTER — Inpatient Hospital Stay: Payer: Medicare HMO | Admitting: Urgent Care

## 2021-07-11 ENCOUNTER — Inpatient Hospital Stay
Admission: RE | Admit: 2021-07-11 | Discharge: 2021-07-16 | DRG: 470 | Disposition: A | Discharge status: Skilled Nursing Facility | Payer: Medicare HMO | Attending: Orthopedic Surgery | Admitting: Orthopedic Surgery

## 2021-07-11 ENCOUNTER — Inpatient Hospital Stay: Payer: Medicare HMO

## 2021-07-11 DIAGNOSIS — M1611 Unilateral primary osteoarthritis, right hip: Secondary | ICD-10-CM | POA: Diagnosis not present

## 2021-07-11 DIAGNOSIS — E785 Hyperlipidemia, unspecified: Secondary | ICD-10-CM | POA: Diagnosis not present

## 2021-07-11 DIAGNOSIS — Z888 Allergy status to other drugs, medicaments and biological substances status: Secondary | ICD-10-CM

## 2021-07-11 DIAGNOSIS — Z801 Family history of malignant neoplasm of trachea, bronchus and lung: Secondary | ICD-10-CM

## 2021-07-11 DIAGNOSIS — I252 Old myocardial infarction: Secondary | ICD-10-CM | POA: Diagnosis not present

## 2021-07-11 DIAGNOSIS — I959 Hypotension, unspecified: Secondary | ICD-10-CM | POA: Diagnosis not present

## 2021-07-11 DIAGNOSIS — Z8249 Family history of ischemic heart disease and other diseases of the circulatory system: Secondary | ICD-10-CM | POA: Diagnosis not present

## 2021-07-11 DIAGNOSIS — L409 Psoriasis, unspecified: Secondary | ICD-10-CM | POA: Diagnosis present

## 2021-07-11 DIAGNOSIS — I110 Hypertensive heart disease with heart failure: Secondary | ICD-10-CM | POA: Diagnosis present

## 2021-07-11 DIAGNOSIS — Z8052 Family history of malignant neoplasm of bladder: Secondary | ICD-10-CM | POA: Diagnosis not present

## 2021-07-11 DIAGNOSIS — M1990 Unspecified osteoarthritis, unspecified site: Secondary | ICD-10-CM | POA: Diagnosis not present

## 2021-07-11 DIAGNOSIS — Z419 Encounter for procedure for purposes other than remedying health state, unspecified: Secondary | ICD-10-CM

## 2021-07-11 DIAGNOSIS — I4891 Unspecified atrial fibrillation: Secondary | ICD-10-CM | POA: Diagnosis not present

## 2021-07-11 DIAGNOSIS — J449 Chronic obstructive pulmonary disease, unspecified: Secondary | ICD-10-CM | POA: Diagnosis present

## 2021-07-11 DIAGNOSIS — Z923 Personal history of irradiation: Secondary | ICD-10-CM | POA: Diagnosis not present

## 2021-07-11 DIAGNOSIS — Z20822 Contact with and (suspected) exposure to covid-19: Secondary | ICD-10-CM | POA: Diagnosis not present

## 2021-07-11 DIAGNOSIS — Z803 Family history of malignant neoplasm of breast: Secondary | ICD-10-CM | POA: Diagnosis not present

## 2021-07-11 DIAGNOSIS — R69 Illness, unspecified: Secondary | ICD-10-CM | POA: Diagnosis not present

## 2021-07-11 DIAGNOSIS — K219 Gastro-esophageal reflux disease without esophagitis: Secondary | ICD-10-CM | POA: Diagnosis present

## 2021-07-11 DIAGNOSIS — D649 Anemia, unspecified: Secondary | ICD-10-CM | POA: Diagnosis not present

## 2021-07-11 DIAGNOSIS — Z8042 Family history of malignant neoplasm of prostate: Secondary | ICD-10-CM | POA: Diagnosis not present

## 2021-07-11 DIAGNOSIS — Z8261 Family history of arthritis: Secondary | ICD-10-CM | POA: Diagnosis not present

## 2021-07-11 DIAGNOSIS — I5042 Chronic combined systolic (congestive) and diastolic (congestive) heart failure: Secondary | ICD-10-CM | POA: Diagnosis present

## 2021-07-11 DIAGNOSIS — R279 Unspecified lack of coordination: Secondary | ICD-10-CM | POA: Diagnosis not present

## 2021-07-11 DIAGNOSIS — M069 Rheumatoid arthritis, unspecified: Secondary | ICD-10-CM | POA: Diagnosis present

## 2021-07-11 DIAGNOSIS — I9581 Postprocedural hypotension: Secondary | ICD-10-CM | POA: Diagnosis not present

## 2021-07-11 DIAGNOSIS — J441 Chronic obstructive pulmonary disease with (acute) exacerbation: Secondary | ICD-10-CM | POA: Diagnosis not present

## 2021-07-11 DIAGNOSIS — Z841 Family history of disorders of kidney and ureter: Secondary | ICD-10-CM

## 2021-07-11 DIAGNOSIS — Z96641 Presence of right artificial hip joint: Secondary | ICD-10-CM

## 2021-07-11 DIAGNOSIS — Z96652 Presence of left artificial knee joint: Secondary | ICD-10-CM | POA: Diagnosis present

## 2021-07-11 DIAGNOSIS — R2681 Unsteadiness on feet: Secondary | ICD-10-CM | POA: Diagnosis not present

## 2021-07-11 DIAGNOSIS — Z853 Personal history of malignant neoplasm of breast: Secondary | ICD-10-CM | POA: Diagnosis not present

## 2021-07-11 DIAGNOSIS — Z79899 Other long term (current) drug therapy: Secondary | ICD-10-CM

## 2021-07-11 DIAGNOSIS — R41841 Cognitive communication deficit: Secondary | ICD-10-CM | POA: Diagnosis not present

## 2021-07-11 DIAGNOSIS — Z882 Allergy status to sulfonamides status: Secondary | ICD-10-CM

## 2021-07-11 DIAGNOSIS — Z7901 Long term (current) use of anticoagulants: Secondary | ICD-10-CM

## 2021-07-11 DIAGNOSIS — I2510 Atherosclerotic heart disease of native coronary artery without angina pectoris: Secondary | ICD-10-CM | POA: Diagnosis present

## 2021-07-11 DIAGNOSIS — Z885 Allergy status to narcotic agent status: Secondary | ICD-10-CM

## 2021-07-11 DIAGNOSIS — Z471 Aftercare following joint replacement surgery: Secondary | ICD-10-CM | POA: Diagnosis not present

## 2021-07-11 DIAGNOSIS — Z91041 Radiographic dye allergy status: Secondary | ICD-10-CM

## 2021-07-11 DIAGNOSIS — Z951 Presence of aortocoronary bypass graft: Secondary | ICD-10-CM | POA: Diagnosis not present

## 2021-07-11 DIAGNOSIS — Z96642 Presence of left artificial hip joint: Secondary | ICD-10-CM | POA: Diagnosis present

## 2021-07-11 DIAGNOSIS — Z8262 Family history of osteoporosis: Secondary | ICD-10-CM

## 2021-07-11 DIAGNOSIS — Z981 Arthrodesis status: Secondary | ICD-10-CM

## 2021-07-11 DIAGNOSIS — Z01818 Encounter for other preprocedural examination: Secondary | ICD-10-CM | POA: Diagnosis not present

## 2021-07-11 DIAGNOSIS — Z741 Need for assistance with personal care: Secondary | ICD-10-CM | POA: Diagnosis not present

## 2021-07-11 DIAGNOSIS — G8918 Other acute postprocedural pain: Secondary | ICD-10-CM

## 2021-07-11 DIAGNOSIS — M6281 Muscle weakness (generalized): Secondary | ICD-10-CM | POA: Diagnosis not present

## 2021-07-11 DIAGNOSIS — Z87891 Personal history of nicotine dependence: Secondary | ICD-10-CM

## 2021-07-11 DIAGNOSIS — I5022 Chronic systolic (congestive) heart failure: Secondary | ICD-10-CM | POA: Diagnosis not present

## 2021-07-11 DIAGNOSIS — R5381 Other malaise: Secondary | ICD-10-CM | POA: Diagnosis not present

## 2021-07-11 HISTORY — DX: Left bundle-branch block, unspecified: I44.7

## 2021-07-11 HISTORY — DX: Long term (current) use of anticoagulants: Z79.01

## 2021-07-11 HISTORY — PX: TOTAL HIP ARTHROPLASTY: SHX124

## 2021-07-11 SURGERY — ARTHROPLASTY, HIP, TOTAL, ANTERIOR APPROACH
Anesthesia: General | Site: Hip | Laterality: Right

## 2021-07-11 MED ORDER — METHOCARBAMOL 1000 MG/10ML IJ SOLN
500.0000 mg | Freq: Four times a day (QID) | INTRAVENOUS | Status: DC | PRN
  Filled 2021-07-11: qty 5

## 2021-07-11 MED ORDER — GABAPENTIN 300 MG PO CAPS
600.0000 mg | ORAL_CAPSULE | Freq: Two times a day (BID) | ORAL | Status: DC
  Administered 2021-07-11 – 2021-07-16 (×11): 600 mg via ORAL
  Filled 2021-07-11 (×11): qty 2

## 2021-07-11 MED ORDER — ACETAMINOPHEN 10 MG/ML IV SOLN
INTRAVENOUS | Status: AC
  Filled 2021-07-11: qty 100

## 2021-07-11 MED ORDER — FENTANYL CITRATE (PF) 100 MCG/2ML IJ SOLN
INTRAMUSCULAR | Status: AC
  Filled 2021-07-11: qty 2

## 2021-07-11 MED ORDER — APIXABAN 5 MG PO TABS
5.0000 mg | ORAL_TABLET | Freq: Two times a day (BID) | ORAL | Status: DC
  Administered 2021-07-12 – 2021-07-16 (×9): 5 mg via ORAL
  Filled 2021-07-11 (×9): qty 1

## 2021-07-11 MED ORDER — APREPITANT 40 MG PO CAPS
ORAL_CAPSULE | ORAL | Status: AC
  Administered 2021-07-11: 40 mg via ORAL
  Filled 2021-07-11: qty 1

## 2021-07-11 MED ORDER — ZOLPIDEM TARTRATE 5 MG PO TABS
5.0000 mg | ORAL_TABLET | Freq: Every evening | ORAL | Status: DC | PRN
  Administered 2021-07-11 – 2021-07-14 (×2): 5 mg via ORAL
  Filled 2021-07-11 (×2): qty 1

## 2021-07-11 MED ORDER — PHENOL 1.4 % MT LIQD
1.0000 | OROMUCOSAL | Status: DC | PRN
  Filled 2021-07-11: qty 177

## 2021-07-11 MED ORDER — DONEPEZIL HCL 5 MG PO TABS
5.0000 mg | ORAL_TABLET | Freq: Every day | ORAL | Status: DC
  Administered 2021-07-11 – 2021-07-15 (×5): 5 mg via ORAL
  Filled 2021-07-11 (×5): qty 1

## 2021-07-11 MED ORDER — ONDANSETRON HCL 4 MG/2ML IJ SOLN
INTRAMUSCULAR | Status: AC
  Filled 2021-07-11: qty 2

## 2021-07-11 MED ORDER — ONDANSETRON HCL 4 MG/2ML IJ SOLN
INTRAMUSCULAR | Status: DC | PRN
  Administered 2021-07-11: 4 mg via INTRAVENOUS

## 2021-07-11 MED ORDER — METOCLOPRAMIDE HCL 5 MG/ML IJ SOLN
5.0000 mg | Freq: Three times a day (TID) | INTRAMUSCULAR | Status: DC | PRN
Start: 2021-07-11 — End: 2021-07-16

## 2021-07-11 MED ORDER — BUPIVACAINE-EPINEPHRINE 0.25% -1:200000 IJ SOLN
INTRAMUSCULAR | Status: DC | PRN
  Administered 2021-07-11: 30 mL

## 2021-07-11 MED ORDER — MENTHOL 3 MG MT LOZG
1.0000 | LOZENGE | OROMUCOSAL | Status: DC | PRN
  Filled 2021-07-11: qty 9

## 2021-07-11 MED ORDER — DEXAMETHASONE SODIUM PHOSPHATE 10 MG/ML IJ SOLN
INTRAMUSCULAR | Status: DC | PRN
  Administered 2021-07-11: 8 mg via INTRAVENOUS

## 2021-07-11 MED ORDER — SODIUM CHLORIDE 0.9 % IV SOLN
INTRAVENOUS | Status: DC | PRN
  Administered 2021-07-11: 30 ug/min via INTRAVENOUS

## 2021-07-11 MED ORDER — FENTANYL CITRATE (PF) 100 MCG/2ML IJ SOLN
INTRAMUSCULAR | Status: DC | PRN
  Administered 2021-07-11: 100 ug via INTRAVENOUS
  Administered 2021-07-11: 50 ug via INTRAVENOUS
  Administered 2021-07-11 (×2): 25 ug via INTRAVENOUS

## 2021-07-11 MED ORDER — PHENYLEPHRINE HCL (PRESSORS) 10 MG/ML IV SOLN
INTRAVENOUS | Status: DC | PRN
  Administered 2021-07-11: 100 ug via INTRAVENOUS

## 2021-07-11 MED ORDER — MOMETASONE FURO-FORMOTEROL FUM 200-5 MCG/ACT IN AERO
2.0000 | INHALATION_SPRAY | Freq: Two times a day (BID) | RESPIRATORY_TRACT | Status: DC
  Administered 2021-07-12: 2 via RESPIRATORY_TRACT
  Filled 2021-07-11: qty 8.8

## 2021-07-11 MED ORDER — APREPITANT 40 MG PO CAPS: 40.0000 mg | ORAL_CAPSULE | Freq: Once | ORAL | Status: DC

## 2021-07-11 MED ORDER — ALUM & MAG HYDROXIDE-SIMETH 200-200-20 MG/5ML PO SUSP: 30.0000 mL | ORAL | Status: DC | PRN

## 2021-07-11 MED ORDER — TRAMADOL HCL 50 MG PO TABS
50.0000 mg | ORAL_TABLET | Freq: Four times a day (QID) | ORAL | Status: DC
  Administered 2021-07-11 – 2021-07-16 (×13): 50 mg via ORAL
  Filled 2021-07-11 (×15): qty 1

## 2021-07-11 MED ORDER — CEFAZOLIN SODIUM-DEXTROSE 2-4 GM/100ML-% IV SOLN
INTRAVENOUS | Status: AC
  Filled 2021-07-11: qty 100

## 2021-07-11 MED ORDER — HYDROCODONE-ACETAMINOPHEN 7.5-325 MG PO TABS
1.0000 | ORAL_TABLET | ORAL | Status: DC | PRN
  Filled 2021-07-11: qty 1

## 2021-07-11 MED ORDER — KETAMINE HCL 50 MG/5ML IJ SOSY
PREFILLED_SYRINGE | INTRAMUSCULAR | Status: AC
  Filled 2021-07-11: qty 5

## 2021-07-11 MED ORDER — SUGAMMADEX SODIUM 200 MG/2ML IV SOLN
INTRAVENOUS | Status: DC | PRN
  Administered 2021-07-11: 300 mg via INTRAVENOUS

## 2021-07-11 MED ORDER — PROPOFOL 10 MG/ML IV BOLUS
INTRAVENOUS | Status: DC | PRN
  Administered 2021-07-11: 120 mg via INTRAVENOUS

## 2021-07-11 MED ORDER — SODIUM CHLORIDE 0.9 % IV SOLN
INTRAVENOUS | Status: DC | PRN
  Administered 2021-07-11: 60 mL

## 2021-07-11 MED ORDER — METOCLOPRAMIDE HCL 10 MG PO TABS: 5.0000 mg | ORAL_TABLET | Freq: Three times a day (TID) | ORAL | Status: DC | PRN

## 2021-07-11 MED ORDER — BISACODYL 10 MG RE SUPP: 10.0000 mg | Freq: Every day | RECTAL | Status: DC | PRN

## 2021-07-11 MED ORDER — FENTANYL CITRATE (PF) 100 MCG/2ML IJ SOLN: 25.0000 ug | INTRAMUSCULAR | Status: DC | PRN

## 2021-07-11 MED ORDER — FAMOTIDINE 20 MG PO TABS
ORAL_TABLET | ORAL | Status: AC
  Administered 2021-07-11: 20 mg via ORAL
  Filled 2021-07-11: qty 1

## 2021-07-11 MED ORDER — METOPROLOL SUCCINATE ER 25 MG PO TB24
25.0000 mg | ORAL_TABLET | Freq: Two times a day (BID) | ORAL | Status: DC
  Administered 2021-07-12 (×2): 25 mg via ORAL
  Filled 2021-07-11 (×3): qty 1

## 2021-07-11 MED ORDER — POLYETHYLENE GLYCOL 3350 17 G PO PACK
17.0000 g | PACK | Freq: Every day | ORAL | Status: DC | PRN
  Administered 2021-07-12 – 2021-07-16 (×2): 17 g via ORAL
  Filled 2021-07-11 (×3): qty 1

## 2021-07-11 MED ORDER — SODIUM CHLORIDE 0.9 % IR SOLN
Status: DC | PRN
  Administered 2021-07-11: 3000 mL

## 2021-07-11 MED ORDER — CHLORHEXIDINE GLUCONATE 0.12 % MT SOLN
OROMUCOSAL | Status: AC
  Administered 2021-07-11: 15 mL via OROMUCOSAL
  Filled 2021-07-11: qty 15

## 2021-07-11 MED ORDER — DEXAMETHASONE SODIUM PHOSPHATE 10 MG/ML IJ SOLN
INTRAMUSCULAR | Status: AC
  Filled 2021-07-11: qty 1

## 2021-07-11 MED ORDER — ROCURONIUM BROMIDE 100 MG/10ML IV SOLN
INTRAVENOUS | Status: DC | PRN
  Administered 2021-07-11: 50 mg via INTRAVENOUS
  Administered 2021-07-11: 20 mg via INTRAVENOUS

## 2021-07-11 MED ORDER — 0.9 % SODIUM CHLORIDE (POUR BTL) OPTIME
TOPICAL | Status: DC | PRN
  Administered 2021-07-11: 1000 mL

## 2021-07-11 MED ORDER — FLUTICASONE PROPIONATE 50 MCG/ACT NA SUSP
2.0000 | Freq: Every day | NASAL | Status: DC | PRN
  Filled 2021-07-11: qty 16

## 2021-07-11 MED ORDER — LIDOCAINE HCL (PF) 2 % IJ SOLN
INTRAMUSCULAR | Status: AC
  Filled 2021-07-11: qty 5

## 2021-07-11 MED ORDER — ONDANSETRON HCL 4 MG PO TABS
4.0000 mg | ORAL_TABLET | Freq: Four times a day (QID) | ORAL | Status: DC | PRN
  Administered 2021-07-12: 4 mg via ORAL
  Filled 2021-07-11: qty 1

## 2021-07-11 MED ORDER — APREMILAST 30 MG PO TABS
30.0000 mg | ORAL_TABLET | Freq: Every day | ORAL | Status: DC
  Administered 2021-07-12 – 2021-07-16 (×4): 30 mg via ORAL
  Filled 2021-07-11 (×4): qty 1

## 2021-07-11 MED ORDER — ONDANSETRON HCL 4 MG/2ML IJ SOLN: 4.0000 mg | Freq: Once | INTRAMUSCULAR | Status: DC | PRN

## 2021-07-11 MED ORDER — LORATADINE 10 MG PO TABS
10.0000 mg | ORAL_TABLET | Freq: Every day | ORAL | Status: DC
  Administered 2021-07-11 – 2021-07-16 (×6): 10 mg via ORAL
  Filled 2021-07-11 (×6): qty 1

## 2021-07-11 MED ORDER — SACUBITRIL-VALSARTAN 24-26 MG PO TABS
1.0000 | ORAL_TABLET | Freq: Two times a day (BID) | ORAL | Status: DC
  Administered 2021-07-11 – 2021-07-12 (×3): 1 via ORAL
  Filled 2021-07-11 (×5): qty 1

## 2021-07-11 MED ORDER — DOCUSATE SODIUM 100 MG PO CAPS
100.0000 mg | ORAL_CAPSULE | Freq: Two times a day (BID) | ORAL | Status: DC
  Administered 2021-07-11 – 2021-07-15 (×9): 100 mg via ORAL
  Filled 2021-07-11 (×11): qty 1

## 2021-07-11 MED ORDER — METHOCARBAMOL 500 MG PO TABS
500.0000 mg | ORAL_TABLET | Freq: Four times a day (QID) | ORAL | Status: DC | PRN
  Filled 2021-07-11: qty 1

## 2021-07-11 MED ORDER — ONDANSETRON HCL 4 MG/2ML IJ SOLN: 4.0000 mg | Freq: Four times a day (QID) | INTRAMUSCULAR | Status: DC | PRN

## 2021-07-11 MED ORDER — HYDROCODONE-ACETAMINOPHEN 5-325 MG PO TABS
1.0000 | ORAL_TABLET | ORAL | Status: DC | PRN
  Administered 2021-07-12: 2 via ORAL
  Administered 2021-07-14 (×2): 1 via ORAL
  Filled 2021-07-11 (×2): qty 1
  Filled 2021-07-11: qty 2

## 2021-07-11 MED ORDER — MORPHINE SULFATE (PF) 2 MG/ML IV SOLN: 0.5000 mg | INTRAVENOUS | Status: DC | PRN

## 2021-07-11 MED ORDER — ACETAMINOPHEN 10 MG/ML IV SOLN
INTRAVENOUS | Status: DC | PRN
  Administered 2021-07-11: 1000 mg via INTRAVENOUS

## 2021-07-11 MED ORDER — LIDOCAINE HCL (CARDIAC) PF 100 MG/5ML IV SOSY
PREFILLED_SYRINGE | INTRAVENOUS | Status: DC | PRN
  Administered 2021-07-11: 80 mg via INTRAVENOUS

## 2021-07-11 MED ORDER — ROSUVASTATIN CALCIUM 5 MG PO TABS
5.0000 mg | ORAL_TABLET | Freq: Every day | ORAL | Status: DC
  Administered 2021-07-13 – 2021-07-16 (×4): 5 mg via ORAL
  Filled 2021-07-11 (×5): qty 1

## 2021-07-11 MED ORDER — KETAMINE HCL 10 MG/ML IJ SOLN
INTRAMUSCULAR | Status: DC | PRN
  Administered 2021-07-11 (×2): 10 mg via INTRAVENOUS

## 2021-07-11 MED ORDER — PROPOFOL 1000 MG/100ML IV EMUL
INTRAVENOUS | Status: AC
  Filled 2021-07-11: qty 100

## 2021-07-11 MED ORDER — VENLAFAXINE HCL 37.5 MG PO TABS
75.0000 mg | ORAL_TABLET | Freq: Every day | ORAL | Status: DC
  Administered 2021-07-11 – 2021-07-15 (×5): 75 mg via ORAL
  Filled 2021-07-11 (×6): qty 2

## 2021-07-11 MED ORDER — KETOROLAC TROMETHAMINE 30 MG/ML IJ SOLN
INTRAMUSCULAR | Status: AC
  Filled 2021-07-11: qty 1

## 2021-07-11 MED ORDER — ACETAMINOPHEN 325 MG PO TABS
325.0000 mg | ORAL_TABLET | Freq: Four times a day (QID) | ORAL | Status: DC | PRN
  Administered 2021-07-12 – 2021-07-13 (×2): 650 mg via ORAL
  Filled 2021-07-11 (×2): qty 2

## 2021-07-11 MED ORDER — ROCURONIUM BROMIDE 10 MG/ML (PF) SYRINGE
PREFILLED_SYRINGE | INTRAVENOUS | Status: AC
  Filled 2021-07-11: qty 10

## 2021-07-11 MED ORDER — SODIUM CHLORIDE 0.9 % IV SOLN: INTRAVENOUS | Status: DC

## 2021-07-11 MED ORDER — CEFAZOLIN SODIUM-DEXTROSE 2-4 GM/100ML-% IV SOLN
2.0000 g | Freq: Four times a day (QID) | INTRAVENOUS | Status: AC
  Administered 2021-07-11 (×2): 2 g via INTRAVENOUS
  Filled 2021-07-11 (×2): qty 100

## 2021-07-11 SURGICAL SUPPLY — 62 items
BLADE SAGITTAL AGGR TOOTH XLG (BLADE) ×2 IMPLANT
BNDG COHESIVE 6X5 TAN ST LF (GAUZE/BANDAGES/DRESSINGS) ×6 IMPLANT
CANISTER SUCT 1200ML W/VALVE (MISCELLANEOUS) ×2 IMPLANT
CANISTER WOUND CARE 500ML ATS (WOUND CARE) ×2 IMPLANT
CHLORAPREP W/TINT 26 (MISCELLANEOUS) ×2 IMPLANT
COVER BACK TABLE REUSABLE LG (DRAPES) ×2 IMPLANT
DRAPE 3/4 80X56 (DRAPES) ×6 IMPLANT
DRAPE C-ARM XRAY 36X54 (DRAPES) ×2 IMPLANT
DRAPE INCISE IOBAN 66X60 STRL (DRAPES) IMPLANT
DRAPE POUCH INSTRU U-SHP 10X18 (DRAPES) ×2 IMPLANT
DRESSING SURGICEL FIBRLLR 1X2 (HEMOSTASIS) ×2 IMPLANT
DRSG MEPILEX SACRM 8.7X9.8 (GAUZE/BANDAGES/DRESSINGS) ×2 IMPLANT
DRSG OPSITE POSTOP 4X8 (GAUZE/BANDAGES/DRESSINGS) ×4 IMPLANT
DRSG SURGICEL FIBRILLAR 1X2 (HEMOSTASIS) ×4
ELECT BLADE 6.5 EXT (BLADE) ×2 IMPLANT
ELECT REM PT RETURN 9FT ADLT (ELECTROSURGICAL) ×2
ELECTRODE REM PT RTRN 9FT ADLT (ELECTROSURGICAL) ×1 IMPLANT
GAUZE 4X4 16PLY ~~LOC~~+RFID DBL (SPONGE) ×2 IMPLANT
GLOVE SURG SYN 9.0  PF PI (GLOVE) ×4
GLOVE SURG SYN 9.0 PF PI (GLOVE) ×2 IMPLANT
GLOVE SURG UNDER POLY LF SZ9 (GLOVE) ×2 IMPLANT
GOWN SRG 2XL LVL 4 RGLN SLV (GOWNS) ×1 IMPLANT
GOWN STRL NON-REIN 2XL LVL4 (GOWNS) ×2
GOWN STRL REUS W/ TWL LRG LVL3 (GOWN DISPOSABLE) ×1 IMPLANT
GOWN STRL REUS W/TWL LRG LVL3 (GOWN DISPOSABLE) ×2
HEMOVAC 400CC 10FR (MISCELLANEOUS) IMPLANT
HIP FEM HD S 28 (Head) ×2 IMPLANT
HOLDER FOLEY CATH W/STRAP (MISCELLANEOUS) ×2 IMPLANT
HOOD PEEL AWAY FLYTE STAYCOOL (MISCELLANEOUS) ×2 IMPLANT
IRRIGATION SURGIPHOR STRL (IV SOLUTION) IMPLANT
KIT PREVENA INCISION MGT 13 (CANNISTER) ×2 IMPLANT
LINER DBL MOB SZ 0 52MM (Liner) ×2 IMPLANT
MANIFOLD NEPTUNE II (INSTRUMENTS) ×2 IMPLANT
MAT ABSORB  FLUID 56X50 GRAY (MISCELLANEOUS) ×2
MAT ABSORB FLUID 56X50 GRAY (MISCELLANEOUS) ×1 IMPLANT
NDL SAFETY ECLIPSE 18X1.5 (NEEDLE) ×1 IMPLANT
NEEDLE HYPO 18GX1.5 SHARP (NEEDLE) ×2
NEEDLE SPNL 20GX3.5 QUINCKE YW (NEEDLE) ×4 IMPLANT
NS IRRIG 1000ML POUR BTL (IV SOLUTION) ×2 IMPLANT
PACK HIP COMPR (MISCELLANEOUS) ×2 IMPLANT
SCALPEL PROTECTED #10 DISP (BLADE) ×4 IMPLANT
SHELL ACETABULAR SZ 52 DM (Shell) ×2 IMPLANT
SOL PREP PVP 2OZ (MISCELLANEOUS) ×2
SOLUTION PREP PVP 2OZ (MISCELLANEOUS) ×1 IMPLANT
SPONGE DRAIN TRACH 4X4 STRL 2S (GAUZE/BANDAGES/DRESSINGS) ×2 IMPLANT
SPONGE T-LAP 18X18 ~~LOC~~+RFID (SPONGE) ×4 IMPLANT
STAPLER SKIN PROX 35W (STAPLE) ×2 IMPLANT
STEM FEMORAL SZ3  STD COLLARED (Stem) ×2 IMPLANT
STRAP SAFETY 5IN WIDE (MISCELLANEOUS) ×2 IMPLANT
SUT DVC 2 QUILL PDO  T11 36X36 (SUTURE) ×2
SUT DVC 2 QUILL PDO T11 36X36 (SUTURE) ×1 IMPLANT
SUT SILK 0 (SUTURE) ×2
SUT SILK 0 30XBRD TIE 6 (SUTURE) ×1 IMPLANT
SUT V-LOC 90 ABS DVC 3-0 CL (SUTURE) ×2 IMPLANT
SUT VIC AB 1 CT1 36 (SUTURE) ×2 IMPLANT
SYR 20ML LL LF (SYRINGE) ×2 IMPLANT
SYR 30ML LL (SYRINGE) ×2 IMPLANT
SYR 50ML LL SCALE MARK (SYRINGE) ×4 IMPLANT
SYR BULB IRRIG 60ML STRL (SYRINGE) ×2 IMPLANT
TAPE MICROFOAM 4IN (TAPE) ×2 IMPLANT
TOWEL OR 17X26 4PK STRL BLUE (TOWEL DISPOSABLE) ×2 IMPLANT
TRAY FOLEY MTR SLVR 16FR STAT (SET/KITS/TRAYS/PACK) ×2 IMPLANT

## 2021-07-11 NOTE — Progress Notes (Signed)
Pt transferred from PACU in stable condition.  Pt A/O x4.  Pt oriented to room and unit.  Call bell placed by pt side.

## 2021-07-11 NOTE — H&P (Signed)
Chief Complaint  Patient presents with   Right Hip - Follow-up, Pain    History of the Present Illness: Teresa [name] is a 80 y.o. female here today.   The patient presents for follow-up evaluation of right hip osteoarthritis. She had x-rays on 03/21/2021 that showed severe global hip osteoarthritis. She has 0 degrees of internal rotation and 20 degrees of external rotation with severe pain. She has a history of prior left total hip arthroplasty with good result.  I have reviewed past medical, surgical, social and family history, and allergies as documented in the EMR.  Past Medical History: Past Medical History:  Diagnosis Date   A-fib (CMS-HCC) 12/2017   Breast cancer (CMS-HCC)   Chronic obstructive pulmonary disease (CMS-HCC) 03/11/2019   Coronary disease   DDD (degenerative disc disease), lumbar   GERD (gastroesophageal reflux disease)   Heart disease   Hyperlipemia   Hypertension   Osteoarthritis of left hip   Osteoarthritis of left knee   PONV (postoperative nausea and vomiting)  with older anesthetics   Psoriasis   PVC (premature ventricular contraction)   Rheumatoid arthritis (CMS-HCC)   Past Surgical History: Past Surgical History:  Procedure Laterality Date   APPENDECTOMY   ARTHROPLASTY HIP TOTAL Left   BACK SURGERY  cervical spine surgery   BREAST SURGERY Left  lumpectomy w/ axijllar lymph nodes   CHOLECYSTECTOMY   CORONARY ARTERY BYPASS GRAFT   FRACTURE SURGERY Right  ankle   HERNIA REPAIR Right  ventral   HYSTERECTOMY   INSTRUMENTATION POSTERIOR SPINE 3 TO 6 VERTEBRAL SEGMENTS N/A 09/23/2018  Procedure: POSTERIOR SEGMENTAL INSTRUMENTATION (EG, PEDICLE FIXATION, DUAL RODS WITH MULTIPLE HOOKS AND SUBLAMINAR WIRES); 3 TO 6 VERTEBRAL SEGMENTS (LIST IN ADDITION TO PRIMARY PROCEDURE); Surgeon: Felipa Furnace, MD; Location: [facility]; Service: Neurosurgery; Laterality: N/A;   KNEE ARTHROSCOPY Left   Left total knee arthroplasty  12/19/2013   right foot surgery 07/2016   SPINE SURGERY  L3, L4, L5 pedicle screws, Dr. Trenton Gammon   TONSILLECTOMY   TUBAL LIGATION   Past Family History: Family History  Problem Relation Age of Onset   Myocardial Infarction (Heart attack) Father   Heart disease Father   Cancer Mother   High blood pressure (Hypertension) Mother   Osteoporosis (Thinning of bones) Mother   Breast cancer Mother   Heart disease Mother   Osteoarthritis Mother   Prostate cancer Brother   Breast cancer Sister   Breast cancer Sister   Lung cancer Sister   Prostate cancer Brother   Anesthesia problems Neg Hx   Medications: Current Outpatient Medications Ordered in Epic  Medication Sig Dispense Refill   acetaminophen (TYLENOL) 650 MG ER tablet 1 tablet every 8-12 hours as needed for joint pain, 90 days 270 tablet 1   apixaban (ELIQUIS) 5 mg tablet Take 1 tablet (5 mg total) by mouth 2 (two) times daily 180 tablet 4   apremilast (OTEZLA) 30 mg tablet Take 1 tablet (30 mg total) by mouth 2 (two) times daily 180 tablet 3   budesonide-formoteroL (SYMBICORT) 160-4.5 mcg/actuation inhaler Inhale 2 inhalations into the lungs as needed   cetirizine (ZYRTEC) 10 MG tablet Take 10 mg by mouth once daily   cholecalciferol (VITAMIN D3) 1000 unit capsule Take 1,000 Units by mouth once daily   donepeziL (ARICEPT) 5 MG tablet Take 5 mg by mouth nightly   fluticasone (FLONASE) 50 mcg/actuation nasal spray Place 2 sprays into both nostrils once daily as needed 12   FUROsemide (LASIX) 20 MG  tablet Take 20 mg by mouth once daily as needed   gabapentin (NEURONTIN) 300 MG capsule Take 2 capsules (600 mg total) by mouth 2 (two) times daily for neuropathic pain. Do not take with other neuroleptics (Lyrica, cymbalta, amitriptyline, etc). Pt. States she is taking 3 times a day currently 05/11/2020   metoprolol succinate (TOPROL-XL) 50 MG XL tablet Take 1 tablet (50 mg total) by mouth once daily 90 tablet 3   multivitamin capsule Take  1 capsule by mouth once daily   nitrofurantoin, macrocrystal-monohydrate, (MACROBID) 100 MG capsule Take 1 capsule (100 mg total) by mouth 2 (two) times daily for 5 days 10 capsule 0   rosuvastatin (CRESTOR) 5 MG tablet Take 5 mg by mouth once daily   sacubitriL-valsartan (ENTRESTO) 24-26 mg tablet Take 1 tablet by mouth 2 (two) times daily 180 tablet 3   traMADoL (ULTRAM) 50 mg tablet TAKE 1 TABLET BY MOUTH EVERY DAY AS NEEDED FOR JOINT PAIN 30 tablet 0   venlafaxine (EFFEXOR) 75 MG tablet Take 75 mg by mouth once daily Pt. States she takes 2 tablets daily now 05/11/2020   No current Epic-ordered facility-administered medications on file.   Allergies: Allergies  Allergen Reactions   Codeine Other (See Comments)  ALTERED MENTAL STATUS   Iodinated Contrast Media Swelling   Sulfa (Sulfonamide Antibiotics) Hives   Amiodarone Nausea And Vomiting    Body mass index is 30.84 kg/m.  Review of Systems: A comprehensive 14 point ROS was performed, reviewed, and the pertinent orthopaedic findings are documented in the HPI.  There were no vitals filed for this visit.   General Physical Examination:   General/Constitutional: No apparent distress: well-nourished and well developed. Eyes: Pupils equal, round with synchronous movement. Lungs: Clear to auscultation HEENT: Normal Vascular: No edema, swelling or tenderness, except as noted in detailed exam. Cardiac: Heart rate and rhythm is regular. Integumentary: No impressive skin lesions present, except as noted in detailed exam. Neuro/Psych: Normal mood and affect, oriented to person, place and time.  Musculoskeletal Examination:  On exam, right hip has 0 degrees internal rotation and 20 degrees external with severe pain.  Radiographs:  No new imaging studies were obtained today.  Assessment: ICD-10-CM  1. Primary osteoarthritis of right hip M16.11   Plan:  The patient has clinical findings of severe right hip  osteoarthritis.  We had a lengthy discussion, answering all questions regarding surgery. She presents in a wheelchair and has difficulty ambulating at present secondary to severe hip pain. Her blood thinner has been stopped for surgery. She does have a history of prior spine fusion, so I think dual mobility from an anterior approach gives her less risk of dislocation. This will likely be with a general anesthetic. She will be on Eliquis postoperatively. The patient is hoping to go to rehab.  Surgical Risks:  The nature of the condition and the proposed procedure has been reviewed in detail with the patient. Surgical versus non-surgical options and prognosis for recovery have been reviewed and the inherent risks and benefits of each have been discussed including the risks of infection, bleeding, injury to nerves/blood vessels/tendons, incomplete relief of symptoms, persisting pain and/or stiffness, loss of function, complex regional pain syndrome, failure of the procedure, as appropriate.  Teeth: Normal  Attestation: I, Dawn Royse, am documenting for [facility], MD utilizing [place].    Electronically signed by Lauris Poag, MD at 07/10/2021 12:37 PM EDT  Reviewed  H+P. No changes noted.

## 2021-07-11 NOTE — Anesthesia Postprocedure Evaluation (Signed)
Anesthesia Post Note  Patient: Teresa [name]  Procedure(s) Performed: TOTAL HIP ARTHROPLASTY ANTERIOR APPROACH (Right: Hip)  Patient location during evaluation: PACU Anesthesia Type: General Level of consciousness: awake and alert Pain management: pain level controlled Vital Signs Assessment: post-procedure vital signs reviewed and stable Respiratory status: spontaneous breathing, nonlabored ventilation, respiratory function stable and patient connected to nasal cannula oxygen Cardiovascular status: blood pressure returned to baseline and stable Postop Assessment: no apparent nausea or vomiting Anesthetic complications: no   No notable events documented.   Last Vitals:  Vitals:   07/11/21 1331 07/11/21 1435  BP: 129/64 (!) 121/45  Pulse: (!) 58 (!) 57  Resp: 16 16  Temp: 36.6 C 36.8 C  SpO2: 99% 97%    Last Pain:  Vitals:   07/11/21 1500  TempSrc:   PainSc: 5                  Martha Clan

## 2021-07-11 NOTE — Transfer of Care (Signed)
Immediate Anesthesia Transfer of Care Note  Patient: Teresa [name]  Procedure(s) Performed: TOTAL HIP ARTHROPLASTY ANTERIOR APPROACH (Right: Hip)  Patient Location: PACU  Anesthesia Type:General  Level of Consciousness: drowsy  Airway & Oxygen Therapy: Patient Spontanous Breathing and Patient connected to face mask oxygen  Post-op Assessment: Report given to RN  Post vital signs: stable  Last Vitals:  Vitals Value Taken Time  BP 147/53 07/11/21 1155  Temp    Pulse 67 07/11/21 1157  Resp 19 07/11/21 1157  SpO2 100 % 07/11/21 1157  Vitals shown include unvalidated device data.  Last Pain:  Vitals:   07/11/21 0752  TempSrc: Oral  PainSc: 9          Complications: No notable events documented.

## 2021-07-11 NOTE — Evaluation (Signed)
Physical Therapy Evaluation Patient Details Name: Teresa [name] MRN: [id number] DOB: Feb 16, 1941 Today's Date: 07/11/2021   History of Present Illness  Teresa [name] is a 89yoF who comes [facility] on 07/11/21 for elective Rt THA, direct anterior with Dr. Rudene Christians. PMH: MI, [place] cardioversion x2 2019, CAD s/p CABG 1994, combined CHF HTN, , Rt foot 1st ray arthrodesis 2017, Left, THA, Left TKA, Left BrCA s/p lumpectomy, Cervical fusion, RA, psoraiasis, vertigo, PONV.  Clinical Impression  Pt admitted with above diagnosis. Pt currently with functional limitations due to the deficits listed below (see "PT Problem List"). Upon entry, pt in bed, awake and finished her lunch; DTR at bedside. The pt is awake, alert, pleasant, interactive, and able to provide info regarding prior level of function, both in tolerance and independence. Pt has 10/10 pain that does not limit session more than ROM limitations and additional need for assist moving operative limb. Commenced educated on HEP and precautions. ModI required to EOB, pt has no PONV sitting up. Min-modA for STS/SPT without device, then minGuardA to stand form elevated BSC with RW. Pt able to balance with LUE while pericaring on the Right. [facility] again no presyncope issues (DTR reports past issues with PONV). Pt left up in recliner at [place] Patient's performance this date reveals decreased ability, independence, and tolerance in performing all basic mobility required for performance of activities of daily living. Pt requires additional DME, close physical assistance, and cues for safe participate in mobility. Pt will benefit from skilled PT intervention to increase independence and safety with basic mobility in preparation for discharge to the venue listed below.       Follow Up Recommendations Supervision for mobility/OOB;Home health PT;Supervision - Intermittent    Equipment Recommendations  Rolling walker with 5" wheels  (Pt has rollator, but would benefit more from RW at DC)    Recommendations for Other Services       Precautions / Restrictions Precautions Precautions: None;Fall Restrictions Weight Bearing Restrictions: Yes RLE Weight Bearing: Weight bearing as tolerated      Mobility  Bed Mobility Overal bed mobility: Needs Assistance Bed Mobility: Supine to Sit     Supine to sit: Mod assist     General bed mobility comments: supine to Rt EOB to simulate typical exit strategy at home. Unable to move operative leg without minA, then pulls self into trunk flexion with hand from author.    Transfers Overall transfer level: Needs assistance Equipment used: Rolling walker (2 wheeled);1 person hand held assist Transfers: Sit to/from SOmnicareSit to Stand: Min assist Stand pivot transfers: Min guard       General transfer comment: minA from EOBx2; minGuard from elevated BSC.  Ambulation/Gait   Gait Distance (Feet): 8 Feet Assistive device: Rolling walker (2 wheeled) Gait Pattern/deviations: Antalgic     General Gait Details: step-to 3-point gait  Stairs            Wheelchair Mobility    Modified Rankin (Stroke Patients Only)       Balance                                             Pertinent Vitals/Pain Pain Assessment: 0-10 Pain Score: 10-Worst pain ever Pain Location: operative hip Pain Intervention(s): Limited activity within patient's tolerance;Monitored during session    Home  Living Family/patient expects to be discharged to:: Private residence Living Arrangements: Alone Available Help at Discharge: Family Type of Home: House Home Access: [place]: Multi-level;Able to live on main level with bedroom/bathroom Home Equipment: Walker - 4 wheels;Bedside commode;Toilet riser;Cane - single point      Prior Function Level of Independence: Needs assistance   Gait / Transfers Assistance Needed:  limited community distances with use of buggy, balance was compromised by hip OA  ADL's / Homemaking Assistance Needed: modI        Hand Dominance        Extremity/Trunk Assessment   Upper Extremity Assessment Upper Extremity Assessment: Generalized weakness;Overall [facility] for tasks assessed    Lower Extremity Assessment Lower Extremity Assessment: Generalized weakness;Overall [facility] for tasks assessed    Cervical / Trunk Assessment Cervical / Trunk Assessment: Normal  Communication      Cognition Arousal/Alertness: Awake/alert Behavior During Therapy: WFL for tasks assessed/performed Overall Cognitive Status: Within Functional Limits for tasks assessed                                        General Comments      Exercises Total Joint Exercises Ankle Circles/Pumps: AROM;Both;15 reps Heel Slides: AAROM;Right;10 reps;Limitations Heel Slides Limitations: poorly tolerated due to pain Hip ABduction/ADduction: AAROM;Right;10 reps;Limitations Hip Abduction/Adduction Limitations: poorly tolerated due to pain   Assessment/Plan    PT Assessment Patient needs continued PT services  PT Problem List Decreased strength;Decreased range of motion;Decreased activity tolerance;Decreased balance;Decreased mobility;Decreased knowledge of precautions       PT Treatment Interventions DME instruction;Gait training;Stair training;Functional mobility training;Therapeutic activities;Therapeutic exercise;Balance training;Patient/family education    PT Goals (Current goals can be found in the Care Plan section)  Acute Rehab PT Goals Patient Stated Goal: DC to home, then progress to OPPT PT Goal Formulation: With patient Time For Goal Achievement: 07/25/21 Potential to Achieve Goals: Good    Frequency BID   Barriers to discharge        Co-evaluation               AM-PAC PT "6 Clicks" Mobility  Outcome Measure Help needed turning from your back to your side while  in a flat bed without using bedrails?: A Lot Help needed moving from lying on your back to sitting on the side of a flat bed without using bedrails?: A Lot Help needed moving to and from a bed to a chair (including a wheelchair)?: A Lot Help needed standing up from a chair using your arms (e.g., wheelchair or bedside chair)?: A Lot Help needed to walk in hospital room?: A Lot Help needed climbing 3-5 steps with a railing? : A Lot 6 Click Score: 12    End of Session   Activity Tolerance: Patient tolerated treatment well;Patient limited by pain Patient left: in chair;with family/visitor present;with call bell/phone within reach Nurse Communication: Mobility status PT Visit Diagnosis: Unsteadiness on feet (R26.81);Other abnormalities of gait and mobility (R26.89);Muscle weakness (generalized) (M62.81);Difficulty in walking, not elsewhere classified (R26.2)    Time: 2706-2376 PT Time Calculation (min) (ACUTE ONLY): 48 min   Charges:   PT Evaluation $PT Eval Moderate Complexity: 1 Mod PT Treatments $Therapeutic Exercise: 23-37 mins       4:07 PM, 07/11/21 Etta Grandchild, PT, DPT Physical Therapist - [facility]  [phone] [facility])  [place] 07/11/2021, 4:03 PM

## 2021-07-11 NOTE — Anesthesia Procedure Notes (Signed)
Procedure Name: Intubation Date/Time: 07/11/2021 10:34 AM Performed by: Lerry Liner, CRNA Pre-anesthesia Checklist: Patient identified, Emergency Drugs available, Suction available and Patient being monitored Patient Re-evaluated:Patient Re-evaluated prior to induction Oxygen Delivery Method: Circle system utilized Preoxygenation: Pre-oxygenation with 100% oxygen Induction Type: IV induction Ventilation: Mask ventilation without difficulty Laryngoscope Size: McGraph and 3 Grade View: Grade I Tube type: Oral Tube size: 7.0 mm Number of attempts: 1 Airway Equipment and Method: Stylet and Oral airway Placement Confirmation: ETT inserted through vocal cords under direct vision, positive ETCO2 and breath sounds checked- equal and bilateral Secured at: 21 cm Tube secured with: Tape Dental Injury: Teeth and Oropharynx as per pre-operative assessment

## 2021-07-11 NOTE — Op Note (Signed)
07/11/2021  11:56 AM  PATIENT:  Teresa [name]  80 y.o. female  PRE-OPERATIVE DIAGNOSIS:  Primary osteoarthritis of right hip M16.11  POST-OPERATIVE DIAGNOSIS:  Primary osteoarthritis of right hip M16.11  PROCEDURE:  Procedure(s): TOTAL HIP ARTHROPLASTY ANTERIOR APPROACH (Right)  SURGEON: Laurene Footman, MD  ASSISTANTS: none  ANESTHESIA:   general  EBL:  Total I/O In: 500 [I.V.:500] Out: 50 [Blood:50]  BLOOD ADMINISTERED:none  DRAINS:  Incisional wound VAC    LOCAL MEDICATIONS USED:  MARCAINE    and OTHER Exparel  SPECIMEN:  Source of Specimen:    femoral head  DISPOSITION OF SPECIMEN:  PATHOLOGY  COUNTS:  YES  TOURNIQUET:  * No tourniquets in log *  IMPLANTS: Medacta AMIS 3 standard stem with metal S 28 mm head,Mpact 52 mm DM cup and liner  DICTATION: .Dragon Dictation   The patient was brought to the operating room and after general anesthesia was obtained patient was placed on the operative table with the ipsilateral foot into the Medacta attachment, contralateral leg on a well-padded table. C-arm was brought in and preop template x-ray taken. After prepping and draping in usual sterile fashion appropriate patient identification and timeout procedures were completed. Anterior approach to the hip was obtained and centered over the greater trochanter and TFL muscle. The subcutaneous tissue was incised hemostasis being achieved by electrocautery. TFL fascia was incised and the muscle retracted laterally deep retractor placed. The lateral femoral circumflex vessels were identified and ligated. The anterior capsule was exposed and a capsulotomy performed. The neck was identified and a femoral neck cut carried out with a saw. The head was removed without difficulty and showed sclerotic femoral head and acetabulum. Reaming was carried out to 52 mm and a 52 mm cup trial gave appropriate tightness to the acetabular component a 52 DM cup was impacted into position. The leg was then  externally rotated and ischiofemoral and pubofemoral releases carried out. The femur was sequentially broached to a size 3, size 3 standard with S head trials were placed and the final components chosen. The 3 standard stem was inserted along with a metal S 28 mm head and 52 mm liner. The hip was reduced and was stable the wound was thoroughly irrigated with fibrillar placed along the posterior capsule and medial neck. The deep fascia ws closed using a heavy Quill after infiltration of 30 cc of quarter percent Sensorcaine with epinephrine diluted with Exparel throughout the case .3-0 V-loc to close the skin with skin staples.  Incisional wound VAC applied and patient was sent to recovery in stable condition.   PLAN OF CARE: Admit to inpatient

## 2021-07-11 NOTE — Anesthesia Preprocedure Evaluation (Signed)
Anesthesia Evaluation  Patient identified by MRN, date of birth, ID band Patient awake    Reviewed: Allergy & Precautions, NPO status , Patient's Chart, lab work & pertinent test results  History of Anesthesia Complications (+) PONV and history of anesthetic complications  Airway Mallampati: II  TM Distance: >3 FB Neck ROM: Full    Dental  (+) Implants, Dental Advidsory Given   Pulmonary neg shortness of breath, asthma (several years since she's had any problems) , neg sleep apnea, neg COPD, neg recent URI, former smoker,    breath sounds clear to auscultation- rhonchi (-) wheezing      Cardiovascular hypertension, pulmonary hypertension(-) angina+ CAD, + Past MI, + CABG (1994) and +CHF  + dysrhythmias Atrial Fibrillation (-) Valvular Problems/Murmurs Rhythm:Regular Rate:Normal - Systolic murmurs and - Diastolic murmurs L heart cath 01/14/17: 1.  Mild nonobstructive coronary artery disease.  There is 20-30% stenosis in the LAD.  Atretic LIMA to LAD 2.  Mildly elevated left ventricular end-diastolic pressure.  Echo 12/04/17: - Left ventricle: The cavity size was normal. Systolic function was   moderately reduced. The estimated ejection fraction was in the   range of 35% to 40%. Hypokinesis of the anteroseptal myocardium.   Hypokinesis of the apical myocardium. Left ventricular diastolic   function parameters were normal. - Mitral valve: There was mild to moderate regurgitation. - Left atrium: The atrium was moderately dilated. - Right ventricle: Systolic function was normal. - Pulmonary arteries: Systolic pressure was moderately elevated. PA   peak pressure: 53 mm Hg (S).   Neuro/Psych neg Seizures TIAnegative psych ROS   GI/Hepatic Neg liver ROS, GERD  ,  Endo/Other  negative endocrine ROSneg diabetes  Renal/GU negative Renal ROS     Musculoskeletal  (+) Arthritis ,   Abdominal (+) + obese,   Peds   Hematology negative hematology ROS (+)   Anesthesia Other Findings Past Medical History: No date: Arthritis No date: Asthma No date: Atrial fibrillation ([place])     Comment:  a. diagnosed 01/13/2018; b. CHADS2VASc => 6 (CHF, HTN,               age x 2, vascular disease, female) No date: Back pain 2009: Breast cancer ([place])     Comment:  left breast  No date: CAD (coronary artery disease)     Comment:  a. 1994 s/p CABG; b. 03/2015 MV: No ischemia; c. MV               11/18: small fixed apical defect likely secondary to               breast attenuation, EF of 42%, frequent PVCs No date: Dental crowns present     Comment:  caps- left back top, right back bottom No date: Gastroesophageal reflux disease No date: Hypertension No date: Ischemic cardiomyopathy     Comment:  a. 2013 EF 40%;  b. 03/2015 Echo: EF 55-60%, mild MR, mod              dil LA, nl RV fxn; c. TTE 12/18: EF of 35-40%,               hypokinesis of the anteroseptal, and apical myocardium,               normal LV diastolic function parameters, mild to moderate              mitral regurgitation, moderately dilated left atrium  measuring 48 mm, normal RV systolic function, moderately               elevated pulmonary arterial pressure measuring 53 mmHg 1994: MI (myocardial infarction) ([place]) No date: Personal history of radiation therapy No date: Psoriasis No date: PSVT (paroxysmal supraventricular tachycardia) ([place])     Comment:  a. 02/2015 Holter: short runs of SVT and NSVT. No date: Pulmonary hypertension ([facility]) No date: PVC's (premature ventricular contractions) No date: Rheumatoid arthritis ([place])     Comment:  feet, hands No date: Vertigo     Comment:  approx 2x/yr   Reproductive/Obstetrics                             Anesthesia Physical  Anesthesia Plan  ASA: 3  Anesthesia Plan: General   Post-op Pain Management:    Induction: Intravenous  PONV Risk Score and Plan: 4 or  greater and Ondansetron, Dexamethasone, Aprepitant and Treatment may vary due to age or medical condition  Airway Management Planned: Oral ETT  Additional Equipment:   Intra-op Plan:   Post-operative Plan: Extubation in OR  Informed Consent: I have reviewed the patients History and Physical, chart, labs and discussed the procedure including the risks, benefits and alternatives for the proposed anesthesia with the patient or authorized representative who has indicated his/her understanding and acceptance.     Dental advisory given  Plan Discussed with: CRNA and Anesthesiologist  Anesthesia Plan Comments:         Anesthesia Quick Evaluation

## 2021-07-12 ENCOUNTER — Encounter: Payer: Self-pay | Admitting: Orthopedic Surgery

## 2021-07-12 LAB — CBC
HCT: 38.2 % (ref 36.0–46.0)
Hemoglobin: 12.9 g/dL (ref 12.0–15.0)
MCH: 32.3 pg (ref 26.0–34.0)
MCHC: 33.8 g/dL (ref 30.0–36.0)
MCV: 95.5 fL (ref 80.0–100.0)
Platelets: 225 10*3/uL (ref 150–400)
RBC: 4 MIL/uL (ref 3.87–5.11)
RDW: 13.2 % (ref 11.5–15.5)
WBC: 6.8 10*3/uL (ref 4.0–10.5)
nRBC: 0 % (ref 0.0–0.2)

## 2021-07-12 LAB — BASIC METABOLIC PANEL
Anion gap: 4 — ABNORMAL LOW (ref 5–15)
BUN: 17 mg/dL (ref 8–23)
CO2: 27 mmol/L (ref 22–32)
Calcium: 8.8 mg/dL — ABNORMAL LOW (ref 8.9–10.3)
Chloride: 108 mmol/L (ref 98–111)
Creatinine, Ser: 0.81 mg/dL (ref 0.44–1.00)
GFR, Estimated: >60 mL/min (ref >60)
Glucose, Bld: 134 mg/dL — ABNORMAL HIGH (ref 70–99)
Potassium: 4.7 mmol/L (ref 3.5–5.1)
Sodium: 139 mmol/L (ref 135–145)

## 2021-07-12 MED ORDER — SODIUM CHLORIDE 0.9 % IV BOLUS
1000.0000 mL | Freq: Once | INTRAVENOUS | Status: AC
  Administered 2021-07-12: 1000 mL via INTRAVENOUS

## 2021-07-12 MED ORDER — METOPROLOL SUCCINATE ER 25 MG PO TB24
25.0000 mg | ORAL_TABLET | Freq: Every day | ORAL | Status: DC
  Administered 2021-07-14: 25 mg via ORAL
  Filled 2021-07-12 (×2): qty 1

## 2021-07-12 MED ORDER — SODIUM CHLORIDE 0.9 % IV BOLUS
500.0000 mL | Freq: Once | INTRAVENOUS | Status: AC
  Administered 2021-07-12: 500 mL via INTRAVENOUS

## 2021-07-12 NOTE — Progress Notes (Signed)
Subjective: 1 Day Post-Op Procedure(s) (LRB): TOTAL HIP ARTHROPLASTY ANTERIOR APPROACH (Right) Patient reports pain as mild.   Patient is well, and has had no acute complaints or problems Denies any CP, SOB, ABD pain. We will continue therapy today.  Plan is to go Skilled nursing facility after hospital stay.  Objective: Vital signs in last 24 hours: Temp:  [97 F (36.1 C)-98.4 F (36.9 C)] 98 F (36.7 C) (07/22 0737) Pulse Rate:  [56-68] 63 (07/22 0737) Resp:  [10-22] 19 (07/22 0737) BP: (107-147)/(44-64) 119/47 (07/22 0737) SpO2:  [90 %-100 %] 97 % (07/22 0737)  Intake/Output from previous day: 07/21 0701 - 07/22 0700 In: 500 [I.V.:500] Out: 50 [Blood:50] Intake/Output this shift: No intake/output data recorded.  Recent Labs    07/12/21 0431  HGB 12.9   Recent Labs    07/12/21 0431  WBC 6.8  RBC 4.00  HCT 38.2  PLT 225   Recent Labs    07/12/21 0431  NA 139  K 4.7  CL 108  CO2 27  BUN 17  CREATININE 0.81  GLUCOSE 134*  CALCIUM 8.8*   No results for input(s): LABPT, INR in the last 72 hours.  EXAM General - Patient is Alert, Appropriate, and Oriented Extremity - Neurovascular intact Sensation intact distally Intact pulses distally Dorsiflexion/Plantar flexion intact Dressing - dressing C/D/I and no drainage Motor Function - intact, moving foot and toes well on exam.   Past Medical History:  Diagnosis Date   Arthritis    Asthma    Back pain    Breast cancer, left ([place]) 2009   CAD (coronary artery disease)    a. 1994 s/p CABG x 1 (LIMA->LAD); b. 03/2015 MV: No ischemia; c. MV 11/18: small fixed apical defect likely secondary to breast attenuation, EF of 42%, frequent PVCs; d. 12/2017 Cath: LM nl, LAD 20p, D1/2/3 nl, LCX min irregs, OM1/2/3 min irregs, RCA nl, RPDA nl, RPL1/2 nl, LIMA->LAD atretic.   Chronic anticoagulation    Apixaban   Chronic combined systolic (congestive) and diastolic (congestive) heart failure ([place])    a. 2013 EF 40%;   b. 03/2015 Echo: EF 55-60%; c. 12/18 Echo: EF of 35-40%; d. 12/2017 TEE: EF 35-40%; e. 03/2018 Echo: EF 40-45%; f. 09/2018 Echo: EF 35%.   DDD (degenerative disc disease), lumbar    Dental crowns present    caps- left back top, right back bottom   Gastroesophageal reflux disease    Hyperlipidemia    Hypertension    LBBB (left bundle branch block)    MI (myocardial infarction) ([place]) 1994   Mixed Ischemic & Nonischemic cardiomyopathy    a. 2013 EF 40%;  b. 03/2015 Echo: EF 55-60%; c. 12/18 Echo: EF of 35-40%, hypokinesis of the anteroseptal, and apical myocardium, mild to mod MR, mod dil LA, nl RV fxn, PASP 53 mmHg; d. 12/2017 TEE: EF 35-40%, diff HK, mild to mod MR. small PFO. No LAA/RAA thrombus; e. 03/2018 Echo: EF 40-45%, antsept/inf HK, Gr2 DD, mild MR, mod idl LA, mild to mod TR, PASP 35-23mHg; f. 09/2018 Echo: EF 35%.   Osteoarthritis    left hip and knee   Persistent atrial fibrillation ([place]    a. diagnosed 01/13/2018; b. CHADS2VASc = 6 --> Eliquis; c. 12/2017 s/p TEE/DCCV. Amio started but d/c'd 01/2018 2/2 n/anorexia; d. 10/2018 DCCV-->recurrent Afib w/in days; e. 10/2018 DCCV x 2 in ED->persistent Afib.   Personal history of radiation therapy    PONV (postoperative nausea and vomiting)    Psoriasis  PSVT (paroxysmal supraventricular tachycardia) ([place])    a. 02/2015 Holter: short runs of SVT and NSVT.   Pulmonary hypertension (HCC)    PVC's (premature ventricular contractions)    a. 03/2018 24h Holter: Freq PVC's with a total of 421 beats in 24 hrs. 7 short runs of SVT likely representing Afib.   Rheumatoid arthritis (HCC)    feet, hands   Vertigo    approx 2x/yr    Assessment/Plan:   1 Day Post-Op Procedure(s) (LRB): TOTAL HIP ARTHROPLASTY ANTERIOR APPROACH (Right) Active Problems:   Status post total hip replacement, right  Estimated body mass index is 30.21 kg/m as calculated from the following:   Height as of this encounter: '5\' 4"'$  (1.626 m).   Weight as of this encounter:  79.8 kg. Advance diet Up with therapy Work on bowel movement Labs and vital signs are stable Pain well controlled Care management to assist with discharge to skilled nursing facility.  DVT Prophylaxis - TED hose and Eliquis, SCDs Weight-Bearing as tolerated to right leg   T. Rachelle Hora, PA-C [place] 07/12/2021, 8:07 AM

## 2021-07-12 NOTE — Progress Notes (Addendum)
Physical Therapy Treatment Patient Details Name: Teresa [name] MRN: [id number] DOB: 1941-06-12 Today's Date: 07/12/2021    History of Present Illness Pt. was admitted to [facility] for elective Right THA, direct anterior approach.  PMH: MI, DC cardioversion x2 2019, CAD s/p CABG 1994, combined CHF HTN, , Rt foot 1st ray arthrodesis 2017, Left, THA, Left TKA, Left BrCA s/p lumpectomy, Cervical fusion, RA, psoraiasis, vertigo, PONV.    PT Comments    Pt was long sitting in bed just finishing OT upon arriving. She agrees to PT session and is cooperative and motivated throughout. Supportive daughter at bedside and concerned with pt possibly dcing to home. The patient does require assistance to exit bed, stand, and ambulate. DC recs being updated to SNF. Pt will need rehab to improve independence with ADLs prior to returning home. CM/RN informed. Will return this afternoon for PM session.    Follow Up Recommendations  SNF;Supervision/Assistance - 24 hour;Supervision for mobility/OOB (Pt lives alone and does not have 24/7 assistance at home.)     Equipment Recommendations  Rolling walker with 5" wheels       Precautions / Restrictions Precautions Precautions: Fall Restrictions Weight Bearing Restrictions: Yes RLE Weight Bearing: Weight bearing as tolerated    Mobility  Bed Mobility Overal bed mobility: Needs Assistance Bed Mobility: Supine to Sit     Supine to sit: Mod assist Sit to supine: Mod assist;Max assist   General bed mobility comments: Pt was able to exit R side of bed with increased time and mod assist. HOB was elevated + pt did use bedrail    Transfers Overall transfer level: Needs assistance Equipment used: Rolling walker (2 wheeled) Transfers: Sit to/from Stand Sit to Stand: Min assist         General transfer comment: Pt was able to stand from EOB to RW with min assist + vcs for hand placement, fwd wt shift, and overall technique  improvements.  Ambulation/Gait Ambulation/Gait assistance: Min assist Gait Distance (Feet): 45 Feet Assistive device: Rolling walker (2 wheeled) Gait Pattern/deviations: Step-to pattern;Antalgic Gait velocity: decreased   General Gait Details: step-to 3-point gait. slow but steady.     Balance Overall balance assessment: Needs assistance Sitting-balance support: Bilateral upper extremity supported;Feet supported Sitting balance-Leahy Scale: Good     Standing balance support: During functional activity;Bilateral upper extremity supported Standing balance-Leahy Scale: Fair Standing balance comment: reliant on UE support during dynamic activity      Cognition Arousal/Alertness: Awake/alert Behavior During Therapy: WFL for tasks assessed/performed Overall Cognitive Status: Within Functional Limits for tasks assessed      General Comments: Pt A and O x 4             Pertinent Vitals/Pain Pain Assessment: 0-10 Pain Score: 7  Pain Location: operative hip Pain Descriptors / Indicators: Aching Pain Intervention(s): Limited activity within patient's tolerance;Monitored during session;Premedicated before session;Repositioned    Home Living Family/patient expects to be discharged to:: Private residence Living Arrangements: Alone Available Help at Discharge: Family Type of Home: House Home Access: Ramped entrance   Home Layout: Multi-level;Able to live on main level with bedroom/bathroom Home Equipment: Walker - 4 wheels;Bedside commode;Toilet riser;Cane - single point      Prior Function Level of Independence: Needs assistance  Gait / Transfers Assistance Needed: limited community distances with use of buggy, balance was compromised by hip OA ADL's / Homemaking Assistance Needed: ModI     PT Goals (current goals can now be found in the care plan section) Acute  Rehab PT Goals Patient Stated Goal: rehab then home Progress towards PT goals: Progressing toward goals     Frequency    BID      PT Plan Discharge plan needs to be updated       AM-PAC PT "6 Clicks" Mobility   Outcome Measure  Help needed turning from your back to your side while in a flat bed without using bedrails?: A Lot Help needed moving from lying on your back to sitting on the side of a flat bed without using bedrails?: A Lot Help needed moving to and from a bed to a chair (including a wheelchair)?: A Lot Help needed standing up from a chair using your arms (e.g., wheelchair or bedside chair)?: A Lot Help needed to walk in hospital room?: A Lot Help needed climbing 3-5 steps with a railing? : A Lot 6 Click Score: 12    End of Session Equipment Utilized During Treatment: Gait belt Activity Tolerance: Patient tolerated treatment well;Patient limited by pain Patient left: in chair;with family/visitor present;with call bell/phone within reach Nurse Communication: Mobility status PT Visit Diagnosis: Unsteadiness on feet (R26.81);Other abnormalities of gait and mobility (R26.89);Muscle weakness (generalized) (M62.81);Difficulty in walking, not elsewhere classified (R26.2)     Time: 1027-1100 PT Time Calculation (min) (ACUTE ONLY): 33 min  Charges:  $Gait Training: 8-22 mins $Therapeutic Activity: 8-22 mins                     Julaine Fusi PTA 07/12/21, 12:54 PM

## 2021-07-12 NOTE — Evaluation (Addendum)
Occupational Therapy Evaluation Patient Details Name: Teresa [name] MRN: [id number] DOB: 1941-08-31 Today's Date: 07/12/2021    History of Present Illness Pt. was admitted to [facility] for elective Right THA, direct anterior approach.  PMH: MI, DC cardioversion x2 2019, CAD s/p CABG 1994, combined CHF HTN, , Rt foot 1st ray arthrodesis 2017, Left, THA, Left TKA, Left BrCA s/p lumpectomy, Cervical fusion, RA, psoraiasis, vertigo, PONV.   Clinical Impression   Pt. Presents with weakness, limited activity tolerance, and limited functional mobility which hinders her ability to complete basic ADL and IADL functioning.  Pt. Resides at home alone. Pt. was independent with  basic ADLs, and light IADL functioning. Pt. 's dtr assists with medication management, meals, and driving. Pt. Education was provided about A/E use for LE ADLs.  Pt. education was provided about safety with standing to perform clothing negotiation skills which pt. as assisted with. Pt. Requires MaxA donning LE clothing over her feet, minA clothing negotiation skills in standing. Pt. Could benefit from OT services for ADL training, A/E training, and pt. Education about home modification, and DME. Pt. would benefit from SNF level of care upon discharge, with follow-up OT services.     Follow Up Recommendations  SNF    Equipment Recommendations  None recommended by OT    Recommendations for Other Services Rehab consult     Precautions / Restrictions Precautions Precautions: None;Fall Restrictions Weight Bearing Restrictions: Yes RLE Weight Bearing: Weight bearing as tolerated      Mobility Bed Mobility   Bed Mobility: Supine to Sit;Sit to Supine     Supine to sit: Max assist;Mod assist Sit to supine: Mod assist;Max assist        Transfers   Equipment used: Rolling walker (2 wheeled) Transfers: Sit to/from [facility] Sit to Stand: Min assist              Balance Overall balance assessment:  Needs assistanceFair dynamic standing balance with RW   Sitting balance-Leahy Scale: Good       Standing balance-Leahy Scale: Fair Standing balance comment: Dynamic Standing                           ADL either performed or assessed with clinical judgement   ADL Overall ADL's : Needs assistance/impaired Eating/Feeding: Set up;Independent   Grooming: Set up;Minimal assistance   Upper Body Bathing: Set up;Minimal assistance   Lower Body Bathing: Set up   Upper Body Dressing : Set up;Minimal assistance   Lower Body Dressing: Set up;Maximal assistance;Minimal assistance Lower Body Dressing Details (indicate cue type and reason): MinA hiking underwear. Increased assist required to donn underwear over her feet             Functional mobility during ADLs: Moderate assistance;Minimal assistance       Vision Baseline Vision/History: Wears glasses Wears Glasses: At all times Patient Visual Report: No change from baseline       Perception     Praxis      Pertinent Vitals/Pain Pain Assessment: 0-10 Pain Score: 10-Worst pain ever Pain Descriptors / Indicators: Aching Pain Intervention(s): Limited activity within patient's tolerance;Monitored during session;Repositioned     Hand Dominance Right   Extremity/Trunk Assessment Upper Extremity Assessment Upper Extremity Assessment: Generalized weakness;Overall WFL for tasks assessed           Communication Communication Communication: No difficulties   Cognition Arousal/Alertness: Awake/alert Behavior During Therapy: WFL for tasks assessed/performed Overall Cognitive Status: Within  Functional Limits for tasks assessed                                     General Comments       Exercises     Shoulder Instructions      Home Living Family/patient expects to be discharged to:: Private residence Living Arrangements: Alone Available Help at Discharge: Family Type of Home: House Home  Access: Ramped entrance     Home Layout: Multi-level;Able to live on main level with bedroom/bathroom               Home Equipment: Walker - 4 wheels;Bedside commode;Toilet riser;Cane - single point          Prior Functioning/Environment Level of Independence: Needs assistance  Gait / Transfers Assistance Needed: limited community distances with use of buggy, balance was compromised by hip OA ADL's / Homemaking Assistance Needed: ModI            OT Problem List: Decreased strength;Impaired UE functional use;Decreased knowledge of use of DME or AE;Decreased activity tolerance;Pain;Decreased cognition      OT Treatment/Interventions: Self-care/ADL training;Therapeutic activities;DME and/or AE instruction;Patient/family education;Therapeutic exercise    OT Goals(Current goals can be found in the care plan section) Acute Rehab OT Goals Patient Stated Goal: To regain independence, Go visiting without pain OT Goal Formulation: With patient Time For Goal Achievement: 08/01/21 Potential to Achieve Goals: Good ADL Goals Pt Will Perform Upper Body Dressing: Independently Pt Will Perform Lower Body Dressing: with min assist Pt Will Transfer to Toilet: with min assist  OT Frequency: Min 2X/week   Barriers to D/C:            Co-evaluation              AM-PAC OT "6 Clicks" Daily Activity     Outcome Measure Help from another person eating meals?: None Help from another person taking care of personal grooming?: A Little Help from another person toileting, which includes using toliet, bedpan, or urinal?: A Lot Help from another person bathing (including washing, rinsing, drying)?: A Lot Help from another person to put on and taking off regular upper body clothing?: A Little Help from another person to put on and taking off regular lower body clothing?: A Lot 6 Click Score: 16   End of Session Equipment Utilized During Treatment: Gait belt  Activity Tolerance: Patient  tolerated treatment well;Patient limited by pain Patient left: in bed  OT Visit Diagnosis: Unsteadiness on feet (R26.81);Muscle weakness (generalized) (M62.81);Pain Pain - Right/Left: Right Pain - part of body: Hip                Time: 0950-1030 OT Time Calculation (min): 40 min Charges:  OT General Charges $OT Visit: 1 Visit OT Evaluation $OT Eval Moderate Complexity: 1 Mod Harrel Carina, MS, OTR/L Harrel Carina 07/12/2021, 12:23 PM

## 2021-07-12 NOTE — TOC Progression Note (Signed)
Transition of Care [facility]) - Progression Note    Patient Details  Name: Teresa [name] MRN: [id number] Date of Birth: 07/12/1941  Transition of Care [facility]) CM/SW [place], RN Phone Number: 07/12/2021, 11:09 AM  Clinical Narrative:     Spoke with Tammy at [facility], they have made a bed offer and the patient accepted it, [facility] will start ins auth process       Expected Discharge Plan and Services                                                 Social Determinants of Health (SDOH) Interventions    Readmission Risk Interventions No flowsheet data found.

## 2021-07-12 NOTE — Progress Notes (Signed)
Chaplain Maggie responded to a page to the patient's room. Patient's daughter was not happy and a team was gathered to offer support. Chaplain waited for nurse team to speak with the family and followed up after 8 pm call for visitor's to depart from the hospital. An invitation to accompany the patient's daughter as she exited. She asked for a moment to speak to the mother alone. Chaplain waited in the hallway. In a few minutes a physician arrived to speak with the family. Continued support available per on call Chaplain.

## 2021-07-12 NOTE — Progress Notes (Signed)
PT Cancellation Note  Patient Details Name: Teresa [name] MRN: [id number] DOB: May 18, 1941   Cancelled Treatment:     PT attempt. Pt current BP 89/36. Will hold session. MD/RN are aware. Will continue to follow and progress per current POC resuming tomorrow.    Willette Pa 07/12/2021, 3:45 PM

## 2021-07-12 NOTE — TOC Progression Note (Signed)
Transition of Care [facility]) - Progression Note    Patient Details  Name: Teresa [name] MRN: [id number] Date of Birth: 04/14/1941  Transition of Care [facility]) CM/SW [place], RN Phone Number: 07/12/2021, 9:32 AM  Clinical Narrative:     Met with the patient and her daughter in the room at the bedside, she stated she has already notified [facility] that she wants to go there for STR, I contacted Tammy at [facility] and gave the heads up that I was sending her request thru the [facility], the patient has had 2 covid vaccines and 2 boosters, She stated that she lives alone and needs to go to [facility] for strengthening and rehabilitation prior to going home.        Expected Discharge Plan and Services                                                 Social Determinants of Health (SDOH) Interventions    Readmission Risk Interventions No flowsheet data found.

## 2021-07-12 NOTE — NC FL2 (Signed)
[place] LEVEL OF CARE SCREENING TOOL     IDENTIFICATION  Patient Name: Teresa [name] Birthdate: [birth year] Sex: female Admission Date (Current Location): 07/11/2021  [facility] and [place] Number:  Engineering geologist and Address:  [facility], [address] [place], [place] [address]      Provider Number: [id number]  Attending Physician Name and Address:  Hessie Knows, MD  Relative Name and Phone Number:  Erskin Burnet, [phone]    Current Level of Care: Hospital Recommended Level of Care: [place] Prior Approval Number:    Date Approved/Denied:   PASRR Number: [id number]  Discharge Plan: SNF    Current Diagnoses: Patient Active Problem List   Diagnosis Date Noted   Status post total hip replacement, right 07/11/2021   Need for prophylactic vaccination against rabies 10/11/2020   Puncture wound 10/11/2020   Need for Tdap vaccination 10/11/2020   Dog bite of right lower leg with infection 10/11/2020   Neuropathy 05/02/2020   Chronic obstructive pulmonary disease ([place]) 03/11/2019   Atrial fibrillation ([place]) 11/11/2018   A-fib ([place]) 10/12/2018   Rheumatoid arthritis ([place]) 10/10/2018   Atrial fibrillation with RVR ([place]) 10/10/2018   Psoriatic arthritis ([place]) 03/04/2018   Unstable angina (HCC)    Atrial fibrillation with rapid ventricular response ([place]) 01/13/2018   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   Asthma 05/09/2017   Primary osteoarthritis of both first carpometacarpal joints 01/31/2016   Arthralgia of both hands 01/31/2016   Ventricular premature depolarization 09/11/2015   Psoriasis 07/16/2015   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   Hyperlipidemia 03/29/2015   Pre-syncope 03/04/2015   CAD (coronary artery disease)    PVC's (premature ventricular contractions)    Ischemic cardiomyopathy    DDD (degenerative disc disease), lumbar 01/18/2015   Intervertebral disc disorder with  radiculopathy of lumbar region 12/26/2014   L-S radiculopathy 07/03/2014   Breast CA ([place]) 06/28/2014   Arthritis, degenerative 06/28/2014   Acid reflux 03/08/2014   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    Orientation RESPIRATION BLADDER Height & Weight     Self, Time, Situation, Place  Normal Continent Weight: 79.8 kg Height:  '5\' 4"'$  (162.6 cm)  BEHAVIORAL SYMPTOMS/MOOD NEUROLOGICAL BOWEL NUTRITION STATUS      Continent Diet (regular)  AMBULATORY STATUS COMMUNICATION OF NEEDS Skin   Extensive Assist Verbally Surgical wounds                       Personal Care Assistance Level of Assistance  Bathing, Dressing Bathing Assistance: Limited assistance   Dressing Assistance: Limited assistance     Functional Limitations Info  Sight Sight Info: Impaired (wears glasses)        SPECIAL CARE FACTORS FREQUENCY  OT (By licensed OT), PT (By licensed PT)     PT Frequency: 5 times per week OT Frequency: 3 times per week            Contractures Contractures Info: Not present    Additional Factors Info  Code Status, Allergies Code Status Info: Full code Allergies Info: Sulfa Antibiotics, Ivp Dye Iodinated Diagnostic Agents, Amiodarone, Codeine           Current Medications (07/12/2021):  This is the current hospital active medication list Current Facility-Administered Medications  Medication Dose Route Frequency Provider Last Rate Last Admin   0.9 %  sodium chloride infusion   Intravenous Continuous Hessie Knows, MD 100 mL/hr at 07/11/21 1350 New Bag at 07/11/21 1350  acetaminophen (TYLENOL) tablet 325-650 mg  325-650 mg Oral Q6H PRN Hessie Knows, MD       alum & mag hydroxide-simeth (MAALOX/MYLANTA) 200-200-20 MG/5ML suspension 30 mL  30 mL Oral Q4H PRN Hessie Knows, MD       apixaban Arne [place]) tablet 5 mg  5 mg Oral BID Hessie Knows, MD   5 mg at 07/12/21 0912   Apremilast TABS 30 mg  30 mg Oral Daily Hessie Knows, MD   30 mg at 07/12/21 [id number]    bisacodyl (DULCOLAX) suppository 10 mg  10 mg Rectal Daily PRN Hessie Knows, MD       docusate sodium (COLACE) capsule 100 mg  100 mg Oral BID Hessie Knows, MD   100 mg at 07/12/21 0911   donepezil (ARICEPT) tablet 5 mg  5 mg Oral QHS Hessie Knows, MD   5 mg at 07/11/21 2140   fluticasone (FLONASE) 50 MCG/ACT nasal spray 2 spray  2 spray Each Nare Daily PRN Hessie Knows, MD       gabapentin (NEURONTIN) capsule 600 mg  600 mg Oral BID Hessie Knows, MD   600 mg at 07/12/21 0911   HYDROcodone-acetaminophen (NORCO) 7.5-325 MG per tablet 1-2 tablet  1-2 tablet Oral Q4H PRN Hessie Knows, MD       HYDROcodone-acetaminophen (NORCO/VICODIN) 5-325 MG per tablet 1-2 tablet  1-2 tablet Oral Q4H PRN Hessie Knows, MD   2 tablet at 07/12/21 0911   loratadine (CLARITIN) tablet 10 mg  10 mg Oral Daily Hessie Knows, MD   10 mg at 07/12/21 [id number]   menthol-cetylpyridinium (CEPACOL) lozenge 3 mg  1 lozenge Oral PRN Hessie Knows, MD       Or   phenol (CHLORASEPTIC) mouth spray 1 spray  1 spray Mouth/Throat PRN Hessie Knows, MD       methocarbamol (ROBAXIN) tablet 500 mg  500 mg Oral Q6H PRN Hessie Knows, MD       Or   methocarbamol (ROBAXIN) 500 mg in dextrose 5 % 50 mL IVPB  500 mg Intravenous Q6H PRN Hessie Knows, MD       metoCLOPramide (REGLAN) tablet 5-10 mg  5-10 mg Oral Q8H PRN Hessie Knows, MD       Or   metoCLOPramide (REGLAN) injection 5-10 mg  5-10 mg Intravenous Q8H PRN Hessie Knows, MD       metoprolol succinate (TOPROL-XL) 24 hr tablet 25 mg  25 mg Oral BID Hessie Knows, MD   25 mg at 07/12/21 0911   mometasone-formoterol (DULERA) 200-5 MCG/ACT inhaler 2 puff  2 puff Inhalation BID Hessie Knows, MD   2 puff at 07/12/21 0912   morphine 2 MG/ML injection 0.5-1 mg  0.5-1 mg Intravenous Q2H PRN Hessie Knows, MD       ondansetron [facility]) tablet 4 mg  4 mg Oral Q6H PRN Hessie Knows, MD   4 mg at 07/12/21 [id number]   Or   ondansetron (ZOFRAN) injection 4 mg  4 mg Intravenous Q6H PRN Hessie Knows, MD       polyethylene glycol (MIRALAX / GLYCOLAX) packet 17 g  17 g Oral Daily PRN Hessie Knows, MD       rosuvastatin (CRESTOR) tablet 5 mg  5 mg Oral Daily Hessie Knows, MD       sacubitril-valsartan (ENTRESTO) 24-26 mg per tablet  1 tablet Oral BID Hessie Knows, MD   1 tablet at 07/12/21 [id number]   traMADol (ULTRAM) tablet 50 mg  50 mg Oral Q6H Hessie Knows, MD  50 mg at 07/12/21 0555   venlafaxine (EFFEXOR) tablet 75 mg  75 mg Oral QHS Hessie Knows, MD   75 mg at 07/11/21 2139   zolpidem (AMBIEN) tablet 5 mg  5 mg Oral QHS PRN Hessie Knows, MD   5 mg at 07/11/21 2140     Discharge Medications: Please see discharge summary for a list of discharge medications.  Relevant Imaging Results:  Relevant Lab Results:   Additional Information SS# [id number]  Su Hilt, RN

## 2021-07-13 DIAGNOSIS — I959 Hypotension, unspecified: Secondary | ICD-10-CM

## 2021-07-13 DIAGNOSIS — I5022 Chronic systolic (congestive) heart failure: Secondary | ICD-10-CM

## 2021-07-13 LAB — CBC
HCT: 31.8 % — ABNORMAL LOW (ref 36.0–46.0)
Hemoglobin: 10.2 g/dL — ABNORMAL LOW (ref 12.0–15.0)
MCH: 31.8 pg (ref 26.0–34.0)
MCHC: 32.1 g/dL (ref 30.0–36.0)
MCV: 99.1 fL (ref 80.0–100.0)
Platelets: 173 10*3/uL (ref 150–400)
RBC: 3.21 MIL/uL — ABNORMAL LOW (ref 3.87–5.11)
RDW: 13.6 % (ref 11.5–15.5)
WBC: 6.6 10*3/uL (ref 4.0–10.5)
nRBC: 0 % (ref 0.0–0.2)

## 2021-07-13 MED ORDER — SACUBITRIL-VALSARTAN 24-26 MG PO TABS
1.0000 | ORAL_TABLET | Freq: Two times a day (BID) | ORAL | Status: DC
  Administered 2021-07-14: 1 via ORAL
  Filled 2021-07-13: qty 1

## 2021-07-13 MED ORDER — MAGNESIUM HYDROXIDE 400 MG/5ML PO SUSP
30.0000 mL | Freq: Once | ORAL | Status: AC
  Administered 2021-07-13: 30 mL via ORAL
  Filled 2021-07-13: qty 30

## 2021-07-13 MED ORDER — FUROSEMIDE 10 MG/ML IJ SOLN
40.0000 mg | Freq: Once | INTRAMUSCULAR | Status: AC
  Administered 2021-07-13: 40 mg via INTRAVENOUS
  Filled 2021-07-13: qty 4

## 2021-07-13 NOTE — Progress Notes (Signed)
Subjective: 2 Days Post-Op Procedure(s) (LRB): TOTAL HIP ARTHROPLASTY ANTERIOR APPROACH (Right) Patient reports pain as mild.   Patient with hypotension and lightheadedness yesterday.  This seems to have resolved with fluids and holding blood pressure medications.  No reports of chest pain, shortness of breath.  No abdominal pain nausea or vomiting.  She is eating and drinking.  She is passing gas.  She has not had a bowel movement. We will continue therapy today.  Plan is to go Skilled nursing facility after hospital stay.  Objective: Vital signs in last 24 hours: Temp:  [97.7 F (36.5 C)-98.9 F (37.2 C)] 98.7 F (37.1 C) (07/23 0738) Pulse Rate:  [59-68] 65 (07/23 0738) Resp:  [12-19] 16 (07/23 0738) BP: (86-117)/(29-50) 102/42 (07/23 0738) SpO2:  [94 %-100 %] 95 % (07/23 0738)  Intake/Output from previous day: 07/22 0701 - 07/23 0700 In: 2340.1 [P.O.:480; I.V.:551; IV Piggyback:1309.1] Out: 1000 [Urine:1000] Intake/Output this shift: Total I/O In: 288.3 [I.V.:288.3] Out: -   Recent Labs    07/12/21 0431 07/13/21 0550  HGB 12.9 10.2*   Recent Labs    07/12/21 0431 07/13/21 0550  WBC 6.8 6.6  RBC 4.00 3.21*  HCT 38.2 31.8*  PLT 225 173   Recent Labs    07/12/21 0431  NA 139  K 4.7  CL 108  CO2 27  BUN 17  CREATININE 0.81  GLUCOSE 134*  CALCIUM 8.8*   No results for input(s): LABPT, INR in the last 72 hours.  EXAM General - Patient is Alert, Appropriate, and Oriented Extremity - Neurovascular intact Sensation intact distally Intact pulses distally Dorsiflexion/Plantar flexion intact Dressing - dressing C/D/I and no drainage, Praveena intact without drainage Motor Function - intact, moving foot and toes well on exam.   Past Medical History:  Diagnosis Date   Arthritis    Asthma    Back pain    Breast cancer, left ([place]) 2009   CAD (coronary artery disease)    a. 1994 s/p CABG x 1 (LIMA->LAD); b. 03/2015 MV: No ischemia; c. MV 11/18: small  fixed apical defect likely secondary to breast attenuation, EF of 42%, frequent PVCs; d. 12/2017 Cath: LM nl, LAD 20p, D1/2/3 nl, LCX min irregs, OM1/2/3 min irregs, RCA nl, RPDA nl, RPL1/2 nl, LIMA->LAD atretic.   Chronic anticoagulation    Apixaban   Chronic combined systolic (congestive) and diastolic (congestive) heart failure ([place])    a. 2013 EF 40%;  b. 03/2015 Echo: EF 55-60%; c. 12/18 Echo: EF of 35-40%; d. 12/2017 TEE: EF 35-40%; e. 03/2018 Echo: EF 40-45%; f. 09/2018 Echo: EF 35%.   DDD (degenerative disc disease), lumbar    Dental crowns present    caps- left back top, right back bottom   Gastroesophageal reflux disease    Hyperlipidemia    Hypertension    LBBB (left bundle branch block)    MI (myocardial infarction) ([place]) 1994   Mixed Ischemic & Nonischemic cardiomyopathy    a. 2013 EF 40%;  b. 03/2015 Echo: EF 55-60%; c. 12/18 Echo: EF of 35-40%, hypokinesis of the anteroseptal, and apical myocardium, mild to mod MR, mod dil LA, nl RV fxn, PASP 53 mmHg; d. 12/2017 TEE: EF 35-40%, diff HK, mild to mod MR. small PFO. No LAA/RAA thrombus; e. 03/2018 Echo: EF 40-45%, antsept/inf HK, Gr2 DD, mild MR, mod idl LA, mild to mod TR, PASP 35-10mHg; f. 09/2018 Echo: EF 35%.   Osteoarthritis    left hip and knee   Persistent atrial fibrillation (  [place])    a. diagnosed 01/13/2018; b. CHADS2VASc = 6 --> Eliquis; c. 12/2017 s/p TEE/DCCV. Amio started but d/c'd 01/2018 2/2 n/anorexia; d. 10/2018 DCCV-->recurrent Afib w/in days; e. 10/2018 DCCV x 2 in ED->persistent Afib.   Personal history of radiation therapy    PONV (postoperative nausea and vomiting)    Psoriasis    PSVT (paroxysmal supraventricular tachycardia) ([place])    a. 02/2015 Holter: short runs of SVT and NSVT.   Pulmonary hypertension (HCC)    PVC's (premature ventricular contractions)    a. 03/2018 24h Holter: Freq PVC's with a total of 421 beats in 24 hrs. 7 short runs of SVT likely representing Afib.   Rheumatoid arthritis (HCC)    feet,  hands   Vertigo    approx 2x/yr    Assessment/Plan:   2 Days Post-Op Procedure(s) (LRB): TOTAL HIP ARTHROPLASTY ANTERIOR APPROACH (Right) Active Problems:   Status post total hip replacement, right  Estimated body mass index is 30.21 kg/m as calculated from the following:   Height as of this encounter: '5\' 4"'$  (1.626 m).   Weight as of this encounter: 79.8 kg. Advance diet Up with therapy Work on bowel movement Labs are stable.  Hemoglobin 10.2 Vital signs are stable.  Patient asymptomatic in bed.  We will monitor vitals with PT.  Hold metoprolol and Entresto today. Pain well controlled.  Care management to assist with discharge to skilled nursing facility.  DVT Prophylaxis - TED hose and Eliquis, SCDs Weight-Bearing as tolerated to right leg   T. Rachelle Hora, PA-C [place] 07/13/2021, 8:31 AM

## 2021-07-13 NOTE — Progress Notes (Signed)
Physical Therapy Treatment Patient Details Name: Teresa [name] MRN: [id number] DOB: 09-Aug-1941 Today's Date: 07/13/2021    History of Present Illness Pt. was admitted to [facility] for elective Right THA, direct anterior approach.  PMH: MI, DC cardioversion x2 2019, CAD s/p CABG 1994, combined CHF HTN, , Rt foot 1st ray arthrodesis 2017, Left, THA, Left TKA, Left BrCA s/p lumpectomy, Cervical fusion, RA, psoraiasis, vertigo, PONV.    PT Comments    Pt was supine in bed asleep upon arriving. She agrees to session and is cooperative throughout. Endorsing 7/10 pain at rest. BP in supine 106/46 however pt is asymptomatic and eager for OOB activity. Upon sitting up EOB BP elevated 111/62. Patient did require mod-max assist to exit bed. Stood from elevated bed height with min assist before ambulating ~ 40 ft with slow antalgic step to pattern. At conclusion of session, BP was 107/47. Cardiology has been consulted. Will continue to follow and progress as able per current POC.   Follow Up Recommendations  SNF;Supervision/Assistance - 24 hour;Supervision for mobility/OOB     Equipment Recommendations  Other (comment) (defer to next level of care)       Precautions / Restrictions Precautions Precautions: Fall Restrictions Weight Bearing Restrictions: No RLE Weight Bearing: Weight bearing as tolerated    Mobility  Bed Mobility Overal bed mobility: Needs Assistance Bed Mobility: Supine to Sit     Supine to sit: Mod assist;Max assist     General bed mobility comments: increased time to perform with vcs for hand placement and technique improvements. BP in supine 106/46, elevated to 111/ 62 sititng EOB    Transfers Overall transfer level: Needs assistance Equipment used: Rolling walker (2 wheeled) Transfers: Sit to/from Stand Sit to Stand: Min assist Stand pivot transfers: From elevated surface       General transfer comment: min assist to stand form elevated bed height. vcs for hand  placement, fwd wt shift, and overall twechnique improvements. pt is very slow moving 2/2 to pain  Ambulation/Gait Ambulation/Gait assistance: Min assist Gait Distance (Feet): 40 Feet Assistive device: Rolling walker (2 wheeled) Gait Pattern/deviations: Step-to pattern;Antalgic;Trunk flexed;Narrow base of support Gait velocity: decreased   General Gait Details: pt was able to ambulate ~ 40 ft with very slow, antalgic, step to pattern. vcs throughout for posture correction, shoulder depression, and improved foot alignment during stance phase. 3 point gait        Balance Overall balance assessment: Needs assistance Sitting-balance support: Bilateral upper extremity supported;Feet supported Sitting balance-Leahy Scale: Good     Standing balance support: During functional activity;Bilateral upper extremity supported Standing balance-Leahy Scale: Fair Standing balance comment: reliant on UE support during dynamic activity          Cognition Arousal/Alertness: Awake/alert Behavior During Therapy: WFL for tasks assessed/performed Overall Cognitive Status: Within Functional Limits for tasks assessed        General Comments: Pt A and O x 4             Pertinent Vitals/Pain Pain Assessment: 0-10 Pain Score: 7  Pain Location: operative hip Pain Descriptors / Indicators: Aching Pain Intervention(s): Limited activity within patient's tolerance;Monitored during session;Premedicated before session;Repositioned     PT Goals (current goals can now be found in the care plan section) Acute Rehab PT Goals Patient Stated Goal: rehab then home Progress towards PT goals: Progressing toward goals    Frequency    BID      PT Plan Current plan remains appropriate  AM-PAC PT "6 Clicks" Mobility   Outcome Measure  Help needed turning from your back to your side while in a flat bed without using bedrails?: A Lot Help needed moving from lying on your back to sitting on the  side of a flat bed without using bedrails?: A Lot Help needed moving to and from a bed to a chair (including a wheelchair)?: A Lot Help needed standing up from a chair using your arms (e.g., wheelchair or bedside chair)?: A Lot Help needed to walk in hospital room?: A Lot Help needed climbing 3-5 steps with a railing? : A Lot 6 Click Score: 12    End of Session Equipment Utilized During Treatment: Gait belt Activity Tolerance: Patient tolerated treatment well;Patient limited by pain Patient left: in chair;with family/visitor present;with call bell/phone within reach Nurse Communication: Mobility status PT Visit Diagnosis: Unsteadiness on feet (R26.81);Other abnormalities of gait and mobility (R26.89);Muscle weakness (generalized) (M62.81);Difficulty in walking, not elsewhere classified (R26.2)     Time: 6349-4944 PT Time Calculation (min) (ACUTE ONLY): 28 min  Charges:  $Gait Training: 8-22 mins $Therapeutic Activity: 8-22 mins                    Julaine Fusi PTA 07/13/21, 11:17 AM

## 2021-07-13 NOTE — Consult Note (Signed)
Cardiology Consultation:   Patient ID: Teresa [name] MRN: [id number]; DOB: 25-Nov-1941  Admit date: 07/11/2021 Date of Consult: 07/13/2021  PCP:  Jerrol Banana., MD   [facility] Providers Cardiologist:  Kathlyn Sacramento, MD        Patient Profile:   Teresa [name] is a 80 y.o. female with a hx of Ischemic cardiomyopathy who is being seen 07/13/2021 for the evaluation of hypotension at the request of Rudene Christians.  History of Present Illness:   Ms. Schubel with hx of CAD (s/p MI in 1994 with CABG (LIMA to LAD; LVEF 40 to 45%), AFib (s/o PVI 6/20)  Followed at [facility] in June in [place] clinic by Veatrice Bourbon.   Per his note CHF was well compensated until Jan 2019 with developed afib after cervical surgery   Underwent DCCV x2 and eventual PVI ablation in June 2020  In SR since    Followed in CHF clinci for medical Rx   In June feeling pretty good     Pt admitted to [facility] on 07/11/21 for R hip replacmeent   {rpcedire wemt we;;  50 cc blood loss    yeseterday BP ws a little low  Cardiology asked to see    Wilburn Mylar she was given several boluses of IV fluid and her toprol and Entresto were held    Currently she is not dizzy  Deneis CP  no SOB    Past Medical History:  Diagnosis Date   Arthritis    Asthma    Back pain    Breast cancer, left ([place]) 2009   CAD (coronary artery disease)    a. 1994 s/p CABG x 1 (LIMA->LAD); b. 03/2015 MV: No ischemia; c. MV 11/18: small fixed apical defect likely secondary to breast attenuation, EF of 42%, frequent PVCs; d. 12/2017 Cath: LM nl, LAD 20p, D1/2/3 nl, LCX min irregs, OM1/2/3 min irregs, RCA nl, RPDA nl, RPL1/2 nl, LIMA->LAD atretic.   Chronic anticoagulation    Apixaban   Chronic combined systolic (congestive) and diastolic (congestive) heart failure ([place])    a. 2013 EF 40%;  b. 03/2015 Echo: EF 55-60%; c. 12/18 Echo: EF of 35-40%; d. 12/2017 TEE: EF 35-40%; e. 03/2018 Echo: EF 40-45%; f. 09/2018 Echo: EF 35%.   DDD (degenerative disc disease), lumbar     Dental crowns present    caps- left back top, right back bottom   Gastroesophageal reflux disease    Hyperlipidemia    Hypertension    LBBB (left bundle branch block)    MI (myocardial infarction) ([place]) 1994   Mixed Ischemic & Nonischemic cardiomyopathy    a. 2013 EF 40%;  b. 03/2015 Echo: EF 55-60%; c. 12/18 Echo: EF of 35-40%, hypokinesis of the anteroseptal, and apical myocardium, mild to mod MR, mod dil LA, nl RV fxn, PASP 53 mmHg; d. 12/2017 TEE: EF 35-40%, diff HK, mild to mod MR. small PFO. No LAA/RAA thrombus; e. 03/2018 Echo: EF 40-45%, antsept/inf HK, Gr2 DD, mild MR, mod idl LA, mild to mod TR, PASP 35-52mHg; f. 09/2018 Echo: EF 35%.   Osteoarthritis    left hip and knee   Persistent atrial fibrillation ([place]    a. diagnosed 01/13/2018; b. CHADS2VASc = 6 --> Eliquis; c. 12/2017 s/p TEE/DCCV. Amio started but d/c'd 01/2018 2/2 n/anorexia; d. 10/2018 DCCV-->recurrent Afib w/in days; e. 10/2018 DCCV x 2 in ED->persistent Afib.   Personal history of radiation therapy    PONV (postoperative nausea and vomiting)  Psoriasis    PSVT (paroxysmal supraventricular tachycardia) ([place])    a. 02/2015 Holter: short runs of SVT and NSVT.   Pulmonary hypertension (HCC)    PVC's (premature ventricular contractions)    a. 03/2018 24h Holter: Freq PVC's with a total of 421 beats in 24 hrs. 7 short runs of SVT likely representing Afib.   Rheumatoid arthritis ([place])    feet, hands   Vertigo    approx 2x/yr    Past Surgical History:  Procedure Laterality Date   ABDOMINAL HYSTERECTOMY     APPENDECTOMY     BREAST BIOPSY Right 1991   negative   BREAST EXCISIONAL BIOPSY Left 2009   positive   BREAST LUMPECTOMY Left 2009   CARDIAC CATHETERIZATION N/A 05/27/1988   Location: [facility]; Surgeon: Doristine Bosworth, MD   CARDIOVERSION N/A 01/15/2018   Procedure: CARDIOVERSION;  Surgeon: Wellington Hampshire, MD;  Location: [facility];  Service: Cardiovascular;  Laterality: N/A;   CARDIOVERSION N/A 10/29/2018    Procedure: CARDIOVERSION;  Surgeon: Minna Merritts, MD;  Location: [facility];  Service: Cardiovascular;  Laterality: N/A;   CARDIOVERSION N/A 11/11/2018   DCCV x 2 attempts at 120 J; Location: [facility] [facility]   CERVICAL FUSION  2019   CHOLECYSTECTOMY     COLONOSCOPY     CORONARY ARTERY BYPASS GRAFT  04/11/1993   1v; LIMA-LAD; Location: [facility]; Surgeon: Merrily Pew, MD   FOOT ARTHRODESIS Right 07/24/2016   Procedure: FUSION FIRST METATARSAL CUNEIFORM JOINT RIGHT FOOT, FUSION SECOND METATARSAL CUNEFORM JOINT BUNION REPAIR RIGHT FOOT;  Surgeon: Albertine Patricia, DPM;  Location: [facility];  Service: Podiatry;  Laterality: Right;   HARDWARE REMOVAL Right 07/24/2016   Procedure: REMOVAL HARDWARE LATERAL MALLEOUS RIGHT ANKLE;  Surgeon: Albertine Patricia, DPM;  Location: [place] [facility];  Service: Podiatry;  Laterality: Right;  REMOVAL OF PIN WHICH WAS INTACT   HERNIA REPAIR     ventral   KNEE ARTHROSCOPY Left    LUMBAR FUSION     Pulmonary vein isolation (cardiac ablation) N/A 05/27/2019   Location: [facility]; Surgeon: Mikel Cella, MD   RIGHT/LEFT HEART CATH AND CORONARY/GRAFT ANGIOGRAPHY N/A 01/14/2018   Procedure: LEFT HEART CATH AND CORONARY ANGIOGRAPHY;  Surgeon: Wellington Hampshire, MD;  Location: [facility];  Service: Cardiovascular;  Laterality: N/A;   TEE WITHOUT CARDIOVERSION N/A 01/15/2018   Procedure: TRANSESOPHAGEAL ECHOCARDIOGRAM (TEE);  Surgeon: Wellington Hampshire, MD;  Location: [facility];  Service: Cardiovascular;  Laterality: N/A;   TONSILLECTOMY     TOTAL HIP ARTHROPLASTY Left    TOTAL HIP ARTHROPLASTY Right 07/11/2021   Procedure: TOTAL HIP ARTHROPLASTY ANTERIOR APPROACH;  Surgeon: Hessie Knows, MD;  Location: [facility];  Service: Orthopedics;  Laterality: Right;   TOTAL KNEE ARTHROPLASTY Left 12/19/2013   TUBAL LIGATION         Inpatient Medications: Scheduled Meds:  apixaban  5 mg Oral BID   Apremilast  30 mg Oral Daily   docusate sodium  100 mg Oral BID    donepezil  5 mg Oral QHS   gabapentin  600 mg Oral BID   loratadine  10 mg Oral Daily   metoprolol succinate  25 mg Oral Daily   mometasone-formoterol  2 puff Inhalation BID   rosuvastatin  5 mg Oral Daily   [START ON 07/14/2021] sacubitril-valsartan  1 tablet Oral BID   traMADol  50 mg Oral Q6H   venlafaxine  75 mg Oral QHS   Continuous Infusions:  sodium chloride 100 mL/hr at 07/13/21 [phone]  methocarbamol (ROBAXIN) IV     PRN Meds: acetaminophen, alum & mag hydroxide-simeth, bisacodyl, fluticasone, HYDROcodone-acetaminophen, HYDROcodone-acetaminophen, menthol-cetylpyridinium **OR** phenol, methocarbamol **OR** methocarbamol (ROBAXIN) IV, metoCLOPramide **OR** metoCLOPramide (REGLAN) injection, morphine injection, ondansetron **OR** ondansetron (ZOFRAN) IV, polyethylene glycol, zolpidem  Allergies:    Allergies  Allergen Reactions   Sulfa Antibiotics Hives   Ivp Dye [Iodinated Diagnostic Agents] Swelling   Amiodarone Nausea And Vomiting and Cough   Codeine Other (See Comments)    ALTERED MENTAL STATUS    Social History:   Social History   Socioeconomic History   Marital status: Divorced    Spouse name: Not on file   Number of children: 2   Years of education: Not on file   Highest education level: 11th grade  Occupational History   Occupation: retired  Tobacco Use   Smoking status: Former    Packs/day: 0.50    Years: 35.00    Pack years: 17.50    Types: Cigarettes    Quit date: 1991    Years since quitting: 31.5   Smokeless tobacco: Never   Tobacco comments:    Quit approx Engineer, manufacturing systems Use: Never used  Substance and Sexual Activity   Alcohol use: No   Drug use: No   Sexual activity: Not on file  Other Topics Concern   Not on file  Social History Narrative   Not on file   Social Determinants of Health   Financial Resource Strain: Low Risk    Difficulty of Paying Living Expenses: Not hard at all  Food Insecurity: No Food Insecurity   Worried  About Charity fundraiser in the Last Year: Never true   Arboriculturist in the Last Year: Never true  Transportation Needs: No Transportation Needs   Lack of Transportation (Medical): No   Lack of Transportation (Non-Medical): No  Physical Activity: Inactive   Days of Exercise per Week: 0 days   Minutes of Exercise per Session: 0 min  Stress: No Stress Concern Present   Feeling of Stress : Not at all  Social Connections: Moderately Isolated   Frequency of Communication with Friends and Family: More than three times a week   Frequency of Social Gatherings with Friends and Family: More than three times a week   Attends Religious Services: More than 4 times per year   Active Member of [employer] or Organizations: No   Attends Archivist Meetings: Never   Marital Status: Divorced  Human resources officer Violence: Not At Risk   Fear of Current or Ex-Partner: No   Emotionally Abused: No   Physically Abused: No   Sexually Abused: No    Family History:    Family History  Problem Relation Age of Onset   Heart attack Father    Congestive Heart Failure Mother    Breast cancer Mother 15   Kidney failure Sister    Prostate cancer Brother    Bladder Cancer Brother    Lung cancer Sister    Arthritis Sister    Breast cancer Sister    Arthritis Sister    Breast cancer Sister    Lung cancer Sister    Lung cancer Brother    Arthritis Brother      ROS:  Please see the history of present illness.   All other ROS reviewed and negative.     Physical Exam/Data:   Vitals:   07/13/21 0022 07/13/21 0300 07/13/21 0738 07/13/21 1254  BP: (!) 112/44 Marland Kitchen)  100/40 (!) 102/42 (!) 107/46  Pulse: 67 64 65 (!) 57  Resp: '17  16 18  '$ Temp: 98.3 F (36.8 C) 98.9 F (37.2 C) 98.7 F (37.1 C) 97.8 F (36.6 C)  TempSrc:      SpO2: 96% 94% 95% 100%  Weight:      Height:        Intake/Output Summary (Last 24 hours) at 07/13/2021 1315 Last data filed at 07/13/2021 1018 Gross per 24 hour  Intake  2388.47 ml  Output 1000 ml  Net 1388.47 ml   Last 3 Weights 07/11/2021 07/02/2021 02/22/2021  Weight (lbs) 176 lb 175 lb 171 lb 1.2 oz  Weight (kg) 79.833 kg 79.379 kg 77.6 kg     Body mass index is 30.21 kg/m.  General:  Well nourished, well developed, in no acute distress HEENT: normal Lymph: no adenopathy Neck: JVP is elevated   Endocrine:  No thryomegaly Vascular: No carotid bruits; FA pulses 2+ bilaterally without bruits  Cardiac:  normal S1, S2; RRR; no murmur  Lungs:  clear to auscultation bilaterally, no wheezing, rhonchi or rales  Abd: soft, nontender, no hepatomegaly  Ext: no edema Musculoskeletal:  No deformities, BUE and BLE strength normal and equal Skin: warm and dry  Neuro:  CNs 2-12 intact, no focal abnormalities noted Psych:  Normal affect   EKG:  The EKG was personally reviewed and demonstrates: SR  64   First degree AV block  PR 238   Nonspecific IVCD Telemetry:  Telemetry was personally reviewed and demonstrates:  Not on tele  Relevant CV Studies:  Cath/PCI:  1994 s/p CABG x 1 (LIMA->LAD)  12/2017 Cath: LM nl, pLAD 20%, D1/2/3 nl, LCX min irregs, OM1/2/3 min irregs, RCA nl, RPDA nl, RPL1/2 nl, LIMA->LAD atretic.   Echocardiography:  2013 EF 40%  03/2015 EF 55-60%  12/18 EF of 35-40%, hypokinesis of the anteroseptal, and apical myocardium, mild to mod MR, mod dil LA, nl RV fxn, PASP 53 mmHg  12/2017 TEE: EF 35-40%, diff HK, mild to mod MR. small PFO. No LAA/RAA thrombus; e. 03/2018 Echo: EF 40-45%, antsept/inf HK, Gr2 DD, mild MR, mod idl LA, mild to mod TR, PASP 35-40 mmHg  07/2019 TTE - LVIDd 4.3cm, aortic sclerosis, LVEF 40%, grade 3 DD, mod enlarged LA, mildly enlarged RA, normal IVC, mild MR/TR  08/24/20 TTE: LVEF 45%, mild LVH, severely dilated LA, mod RV enlargement with mild RVSD, normal CVP, mild TR, mild MR.  Laboratory Data:  High Sensitivity Troponin:  No results for input(s): TROPONINIHS in the last 720 hours.   Chemistry Recent Labs  Lab  07/12/21 0431  NA 139  K 4.7  CL 108  CO2 27  GLUCOSE 134*  BUN 17  CREATININE 0.81  CALCIUM 8.8*  GFRNONAA >60  ANIONGAP 4*    No results for input(s): PROT, ALBUMIN, AST, ALT, ALKPHOS, BILITOT in the last 168 hours. Hematology Recent Labs  Lab 07/12/21 0431 07/13/21 0550  WBC 6.8 6.6  RBC 4.00 3.21*  HGB 12.9 10.2*  HCT 38.2 31.8*  MCV 95.5 99.1  MCH 32.3 31.8  MCHC 33.8 32.1  RDW 13.2 13.6  PLT 225 173   BNPNo results for input(s): BNP, PROBNP in the last 168 hours.  DDimer No results for input(s): DDIMER in the last 168 hours.   Radiology/Studies:  DG HIP OPERATIVE UNILAT W OR W/O PELVIS RIGHT  Result Date: 07/11/2021 CLINICAL DATA:  Total right hip replacement EXAM: OPERATIVE right HIP (WITH PELVIS IF PERFORMED) 1  VIEWS TECHNIQUE: Fluoroscopic spot image(s) were submitted for interpretation post-operatively. COMPARISON:  Radiograph 02/22/2021 FINDINGS: Intraoperative radiograph right hip arthroplasty. Normal alignment without evidence of complication on this limited single frontal view. IMPRESSION: Intraoperative radiograph for right hip arthroplasty. Normal alignment without complication on limited single frontal view. Electronically Signed   By: Maurine Simmering   On: 07/11/2021 14:04   DG HIP UNILAT W OR W/O PELVIS 2-3 VIEWS RIGHT  Result Date: 07/11/2021 CLINICAL DATA:  Right hip arthroplasty EXAM: DG HIP (WITH OR WITHOUT PELVIS) 2-3V RIGHT COMPARISON:  02/22/2021 FINDINGS: Interval postsurgical changes from right hip arthroplasty. Arthroplasty components are in their expected alignment. No periprosthetic fracture or evidence of other complication. Expected postoperative changes within the overlying soft tissues. Incompletely visualized left hip arthroplasty appears intact. IMPRESSION: Satisfactory postoperative appearance status post right hip arthroplasty. Electronically Signed   By: Davina Poke D.O.   On: 07/11/2021 13:27     Assessment and Plan:   1   Hypotension   Pt with transient hypotension yesterday  Meds held   Give IV boluses   BP improved today  Exam with some mild volume increase  I would give lasix x1    If BP OK would start low dose toprol XL 25 mg    Follow      2  Chronic systolic CHF  Echo just done at [facility] in June   Previously LVEF 40s    Had been on medical Rx    Class II CHF    Currently with very mild volume increase   Give lasix x 1  Follow    Holld meds for now given BP issues  If OK will resume slowly    3  Hx afib  / PAF   s/p ablation in 2020  Per cardiology assessment expect some PAF   Followed by Esmond Harps, EP at [facility]   Visit in 10/22 On Eliquis     4 Hx CAD   Remote CABG  No symtpoms of angina    5  HL  Continue Crestor    For questions or updates, please contact [facility] Please consult [email or web address] for contact info under    Signed, Dorris Carnes, MD  07/13/2021 1:15 PM

## 2021-07-13 NOTE — Progress Notes (Signed)
Physical Therapy Treatment Patient Details Name: Teresa [name] MRN: [id number] DOB: 10/24/1941 Today's Date: 07/13/2021    History of Present Illness Pt. was admitted to [facility] for elective Right THA, direct anterior approach.  PMH: MI, DC cardioversion x2 2019, CAD s/p CABG 1994, combined CHF HTN, , Rt foot 1st ray arthrodesis 2017, Left, THA, Left TKA, Left BrCA s/p lumpectomy, Cervical fusion, RA, psoraiasis, vertigo, PONV.    PT Comments    Pt was in bed upon arriving. Agrees to performing exercises requesting not to ambulate. HEP handout issued and performed. At conclusion, pt requested to have BM. Was able to exit bed, stand, and take a few steps to [facility]. RN staff aware. See exercises listed below. Pt will greatly benefit from SNF at DC to address deficits while maximizing independence with ADLs.    Follow Up Recommendations  SNF;Supervision/Assistance - 24 hour;Supervision for mobility/OOB     Equipment Recommendations  Other (comment) (defer to next level of care)       Precautions / Restrictions Precautions Precautions: Fall Restrictions Weight Bearing Restrictions: No RLE Weight Bearing: Weight bearing as tolerated    Mobility  Bed Mobility Overal bed mobility: Needs Assistance Bed Mobility: Supine to Sit     Supine to sit: Mod assist     General bed mobility comments: After performing ther ex pt needed to use BR. mod assist to exit bed.    Transfers Overall transfer level: Needs assistance Equipment used: Rolling walker (2 wheeled) Transfers: Sit to/from [facility] Sit to Stand: Min assist Stand pivot transfers: From elevated surface       General transfer comment: Pt stood EOB and took ~ 3 steps to [facility] to have BM/urinate. actively having both during transfer. requested to stay on [facility] until RN tech returned.  Ambulation/Gait Ambulation/Gait assistance: Min assist Gait Distance (Feet): 40 Feet Assistive device: Rolling walker (2  wheeled) Gait Pattern/deviations: Step-to pattern;Antalgic;Trunk flexed;Narrow base of support Gait velocity: decreased   General Gait Details: pt was able to ambulate ~ 40 ft with very slow, antalgic, step to pattern. vcs throughout for posture correction, shoulder depression, and improved foot alignment during stance phase. 3 point gait      Balance Overall balance assessment: Needs assistance Sitting-balance support: Bilateral upper extremity supported;Feet supported Sitting balance-Leahy Scale: Good     Standing balance support: During functional activity;Bilateral upper extremity supported Standing balance-Leahy Scale: Fair Standing balance comment: reliant on UE support during dynamic activity        Cognition Arousal/Alertness: Awake/alert Behavior During Therapy: WFL for tasks assessed/performed Overall Cognitive Status: Within Functional Limits for tasks assessed      General Comments: Pt A and O x 4      Exercises Total Joint Exercises Ankle Circles/Pumps: AROM;10 reps Quad Sets: AROM;10 reps Gluteal Sets: AROM;10 reps Towel Squeeze: AROM;10 reps Short Arc Quad: AROM;10 reps Heel Slides: AROM;10 reps Hip ABduction/ADduction: AAROM;Right;10 reps;Limitations Straight Leg Raises: AAROM;10 reps        Pertinent Vitals/Pain Pain Assessment: 0-10 Pain Score: 6  Pain Location: operative hip Pain Descriptors / Indicators: Aching Pain Intervention(s): Limited activity within patient's tolerance;Monitored during session;Premedicated before session;Repositioned     PT Goals (current goals can now be found in the care plan section) Acute Rehab PT Goals Patient Stated Goal: rehab then home Progress towards PT goals: Progressing toward goals    Frequency    BID      PT Plan Current plan remains appropriate  AM-PAC PT "6 Clicks" Mobility   Outcome Measure  Help needed turning from your back to your side while in a flat bed without using bedrails?:  A Lot Help needed moving from lying on your back to sitting on the side of a flat bed without using bedrails?: A Lot Help needed moving to and from a bed to a chair (including a wheelchair)?: A Little Help needed standing up from a chair using your arms (e.g., wheelchair or bedside chair)?: A Little Help needed to walk in hospital room?: A Little Help needed climbing 3-5 steps with a railing? : A Lot 6 Click Score: 15    End of Session Equipment Utilized During Treatment: Gait belt Activity Tolerance: Patient tolerated treatment well Patient left: Other (comment) (on [facility] with RN staff aware and daughter present) Nurse Communication: Mobility status PT Visit Diagnosis: Unsteadiness on feet (R26.81);Other abnormalities of gait and mobility (R26.89);Muscle weakness (generalized) (M62.81);Difficulty in walking, not elsewhere classified (R26.2)     Time: 6986-1483 PT Time Calculation (min) (ACUTE ONLY): 25 min  Charges:  $Gait Training: 8-22 mins $Therapeutic Exercise: 8-22 mins $Therapeutic Activity: 8-22 mins                     Julaine Fusi PTA 07/13/21, 2:53 PM

## 2021-07-14 DIAGNOSIS — I4891 Unspecified atrial fibrillation: Secondary | ICD-10-CM

## 2021-07-14 LAB — RESP PANEL BY RT-PCR (FLU A&B, COVID) ARPGX2
Influenza A by PCR: NEGATIVE
Influenza B by PCR: NEGATIVE
SARS Coronavirus 2 by RT PCR: NEGATIVE

## 2021-07-14 MED ORDER — HYDROCODONE-ACETAMINOPHEN 5-325 MG PO TABS: 1.0000 | ORAL_TABLET | ORAL | 0 refills | Status: DC | PRN

## 2021-07-14 MED ORDER — TRAMADOL HCL 50 MG PO TABS: 50.0000 mg | ORAL_TABLET | Freq: Four times a day (QID) | ORAL | 0 refills | Status: DC | PRN

## 2021-07-14 MED ORDER — METHOCARBAMOL 500 MG PO TABS: 500.0000 mg | ORAL_TABLET | Freq: Four times a day (QID) | ORAL | Status: DC | PRN

## 2021-07-14 MED ORDER — DOCUSATE SODIUM 100 MG PO CAPS: 100.0000 mg | ORAL_CAPSULE | Freq: Two times a day (BID) | ORAL | 0 refills | Status: DC

## 2021-07-14 MED ORDER — METOPROLOL SUCCINATE ER 25 MG PO TB24: 25.0000 mg | ORAL_TABLET | Freq: Every day | ORAL | Status: DC

## 2021-07-14 MED ORDER — GABAPENTIN 300 MG PO CAPS: 600.0000 mg | ORAL_CAPSULE | Freq: Two times a day (BID) | ORAL | Status: DC

## 2021-07-14 MED ORDER — ROSUVASTATIN CALCIUM 5 MG PO TABS
5.0000 mg | ORAL_TABLET | Freq: Every day | ORAL | Status: DC
Start: 2021-07-14 — End: 2021-08-14

## 2021-07-14 NOTE — Progress Notes (Signed)
Subjective: 3 Days Post-Op Procedure(s) (LRB): TOTAL HIP ARTHROPLASTY ANTERIOR APPROACH (Right) Patient reports pain as mild.   Hypertension improved.  Cardiology consulted recommended 1 dose of Lasix and continuing with metoprolol.  Patient states she is feeling better this morning. We will continue therapy today.  Slow progress of physical therapy Plan is to go Skilled nursing facility after hospital stay.  Objective: Vital signs in last 24 hours: Temp:  [97.8 F (36.6 C)-99.5 F (37.5 C)] 98.9 F (37.2 C) (07/24 0821) Pulse Rate:  [57-80] 80 (07/24 0821) Resp:  [14-18] 17 (07/24 0821) BP: (107-132)/(41-55) 132/53 (07/24 0821) SpO2:  [95 %-100 %] 95 % (07/24 0821)  Intake/Output from previous day: 07/23 0701 - 07/24 0700 In: 1130.4 [P.O.:240; I.V.:890.4] Out: 2300 [Urine:2300] Intake/Output this shift: No intake/output data recorded.  Recent Labs    07/12/21 0431 07/13/21 0550  HGB 12.9 10.2*   Recent Labs    07/12/21 0431 07/13/21 0550  WBC 6.8 6.6  RBC 4.00 3.21*  HCT 38.2 31.8*  PLT 225 173   Recent Labs    07/12/21 0431  NA 139  K 4.7  CL 108  CO2 27  BUN 17  CREATININE 0.81  GLUCOSE 134*  CALCIUM 8.8*   No results for input(s): LABPT, INR in the last 72 hours.  EXAM General - Patient is Alert, Appropriate, and Oriented Extremity - Neurovascular intact Sensation intact distally Intact pulses distally Dorsiflexion/Plantar flexion intact Dressing - dressing C/D/I and no drainage, Praveena intact without drainage Motor Function - intact, moving foot and toes well on exam.   Past Medical History:  Diagnosis Date   Arthritis    Asthma    Back pain    Breast cancer, left ([place]) 2009   CAD (coronary artery disease)    a. 1994 s/p CABG x 1 (LIMA->LAD); b. 03/2015 MV: No ischemia; c. MV 11/18: small fixed apical defect likely secondary to breast attenuation, EF of 42%, frequent PVCs; d. 12/2017 Cath: LM nl, LAD 20p, D1/2/3 nl, LCX min irregs,  OM1/2/3 min irregs, RCA nl, RPDA nl, RPL1/2 nl, LIMA->LAD atretic.   Chronic anticoagulation    Apixaban   Chronic combined systolic (congestive) and diastolic (congestive) heart failure ([place])    a. 2013 EF 40%;  b. 03/2015 Echo: EF 55-60%; c. 12/18 Echo: EF of 35-40%; d. 12/2017 TEE: EF 35-40%; e. 03/2018 Echo: EF 40-45%; f. 09/2018 Echo: EF 35%.   DDD (degenerative disc disease), lumbar    Dental crowns present    caps- left back top, right back bottom   Gastroesophageal reflux disease    Hyperlipidemia    Hypertension    LBBB (left bundle branch block)    MI (myocardial infarction) ([place]) 1994   Mixed Ischemic & Nonischemic cardiomyopathy    a. 2013 EF 40%;  b. 03/2015 Echo: EF 55-60%; c. 12/18 Echo: EF of 35-40%, hypokinesis of the anteroseptal, and apical myocardium, mild to mod MR, mod dil LA, nl RV fxn, PASP 53 mmHg; d. 12/2017 TEE: EF 35-40%, diff HK, mild to mod MR. small PFO. No LAA/RAA thrombus; e. 03/2018 Echo: EF 40-45%, antsept/inf HK, Gr2 DD, mild MR, mod idl LA, mild to mod TR, PASP 35-56mHg; f. 09/2018 Echo: EF 35%.   Osteoarthritis    left hip and knee   Persistent atrial fibrillation ([place]    a. diagnosed 01/13/2018; b. CHADS2VASc = 6 --> Eliquis; c. 12/2017 s/p TEE/DCCV. Amio started but d/c'd 01/2018 2/2 n/anorexia; d. 10/2018 DCCV-->recurrent Afib w/in days; e. 10/2018  DCCV x 2 in ED->persistent Afib.   Personal history of radiation therapy    PONV (postoperative nausea and vomiting)    Psoriasis    PSVT (paroxysmal supraventricular tachycardia) ([place])    a. 02/2015 Holter: short runs of SVT and NSVT.   Pulmonary hypertension (HCC)    PVC's (premature ventricular contractions)    a. 03/2018 24h Holter: Freq PVC's with a total of 421 beats in 24 hrs. 7 short runs of SVT likely representing Afib.   Rheumatoid arthritis (HCC)    feet, hands   Vertigo    approx 2x/yr    Assessment/Plan:   3 Days Post-Op Procedure(s) (LRB): TOTAL HIP ARTHROPLASTY ANTERIOR APPROACH  (Right) Active Problems:   Status post total hip replacement, right  Estimated body mass index is 30.21 kg/m as calculated from the following:   Height as of this encounter: '5\' 4"'$  (1.626 m).   Weight as of this encounter: 79.8 kg. Advance diet Up with therapy Work on bowel movement Labs are stable.  Hemoglobin 10.2 Vital signs are stable.  Blood pressure improved. Pain well controlled.  Care management to assist with discharge to skilled nursing facility.  DVT Prophylaxis - TED hose and Eliquis, SCDs Weight-Bearing as tolerated to right leg   T. Rachelle Hora, PA-C [place] 07/14/2021, 9:41 AM

## 2021-07-14 NOTE — Progress Notes (Signed)
Physical Therapy Treatment Patient Details Name: Teresa [name] MRN: [id number] DOB: Nov 30, 1941 Today's Date: 07/14/2021    History of Present Illness Pt. was admitted to [facility] for elective Right THA, direct anterior approach.  PMH: MI, DC cardioversion x2 2019, CAD s/p CABG 1994, combined CHF HTN, , Rt foot 1st ray arthrodesis 2017, Left, THA, Left TKA, Left BrCA s/p lumpectomy, Cervical fusion, RA, psoraiasis, vertigo, PONV.    PT Comments    Pt ready for session.  Participated in exercises as described below.  To EOB with min a x 1 and increased time and cues.  Steady in sitting.  Stood with min a x 1 and transferred to commode at bedside to void.  She stood and was able to progress gait 54' with slow but steady gait.  Overall good progression today.  SNF remains appropriate for discharge.   Follow Up Recommendations  SNF;Supervision/Assistance - 24 hour;Supervision for mobility/OOB     Equipment Recommendations  Other (comment) (defer to next level of care)    Recommendations for Other Services       Precautions / Restrictions Precautions Precautions: Fall Restrictions Weight Bearing Restrictions: Yes RLE Weight Bearing: Weight bearing as tolerated    Mobility  Bed Mobility Overal bed mobility: Needs Assistance Bed Mobility: Supine to Sit     Supine to sit: Min assist          Transfers Overall transfer level: Needs assistance Equipment used: Rolling walker (2 wheeled) Transfers: Sit to/from Stand Sit to Stand: Min assist            Ambulation/Gait Ambulation/Gait assistance: Min Web designer (Feet): 50 Feet Assistive device: Rolling walker (2 wheeled) Gait Pattern/deviations: Step-to pattern;Antalgic;Trunk flexed;Narrow base of support Gait velocity: decreased   General Gait Details: slow gait but improving overall   Stairs             Wheelchair Mobility    Modified Rankin (Stroke Patients Only)       Balance Overall balance  assessment: Needs assistance   Sitting balance-Leahy Scale: Good     Standing balance support: Bilateral upper extremity supported Standing balance-Leahy Scale: Fair Standing balance comment: reliant on UE support during dynamic activity                            Cognition Arousal/Alertness: Awake/alert Behavior During Therapy: WFL for tasks assessed/performed Overall Cognitive Status: Within Functional Limits for tasks assessed                                        Exercises Total Joint Exercises Ankle Circles/Pumps: AROM;10 reps Quad Sets: AROM;10 reps Gluteal Sets: AROM;10 reps Towel Squeeze: AROM;10 reps Short Arc Quad: AROM;10 reps Heel Slides: AROM;10 reps Hip ABduction/ADduction: AAROM;Right;10 reps;Limitations Straight Leg Raises: AAROM;10 reps    General Comments        Pertinent Vitals/Pain Pain Assessment: Faces Faces Pain Scale: Hurts even more Pain Location: operative hip Pain Descriptors / Indicators: Aching;Sore;Grimacing;Guarding Pain Intervention(s): Limited activity within patient's tolerance;Monitored during session;Premedicated before session;Repositioned    Home Living                      Prior Function            PT Goals (current goals can now be found in the care plan section) Progress towards PT goals:  Progressing toward goals    Frequency    BID      PT Plan Current plan remains appropriate    Co-evaluation              AM-PAC PT "6 Clicks" Mobility   Outcome Measure  Help needed turning from your back to your side while in a flat bed without using bedrails?: A Lot Help needed moving from lying on your back to sitting on the side of a flat bed without using bedrails?: A Lot Help needed moving to and from a bed to a chair (including a wheelchair)?: A Little Help needed standing up from a chair using your arms (e.g., wheelchair or bedside chair)?: A Little Help needed to walk in  hospital room?: A Little Help needed climbing 3-5 steps with a railing? : A Lot 6 Click Score: 15    End of Session Equipment Utilized During Treatment: Gait belt Activity Tolerance: Patient tolerated treatment well Patient left: Other (comment) (on [facility] with RN staff aware and daughter present) Nurse Communication: Mobility status PT Visit Diagnosis: Unsteadiness on feet (R26.81);Other abnormalities of gait and mobility (R26.89);Muscle weakness (generalized) (M62.81);Difficulty in walking, not elsewhere classified (R26.2)     Time: 9295-7473 PT Time Calculation (min) (ACUTE ONLY): 25 min  Charges:  $Gait Training: 8-22 mins $Therapeutic Exercise: 8-22 mins                    Miquela Costabile, PTA 07/14/21, 1:07 PM , 1:04 PM

## 2021-07-14 NOTE — Discharge Instructions (Signed)

## 2021-07-14 NOTE — Discharge Summary (Addendum)
Physician Discharge Summary  Patient ID: MAIARA GRANER MRN: [id number] DOB/AGE: [birth year] 80 y.o.  Admit date: 07/11/2021 Discharge date: 07/16/2021  Admission Diagnoses:  Status post total hip replacement, right [Z96.641]   Discharge Diagnoses: Patient Active Problem List   Diagnosis Date Noted   Status post total hip replacement, right 07/11/2021   Need for prophylactic vaccination against rabies 10/11/2020   Puncture wound 10/11/2020   Need for Tdap vaccination 10/11/2020   Dog bite of right lower leg with infection 10/11/2020   Neuropathy 05/02/2020   Chronic obstructive pulmonary disease ([place]) 03/11/2019   Atrial fibrillation ([place]) 11/11/2018   A-fib ([place]) 10/12/2018   Rheumatoid arthritis ([place]) 10/10/2018   Atrial fibrillation with RVR ([place]) 10/10/2018   Psoriatic arthritis ([place]) 03/04/2018   Unstable angina (HCC)    Atrial fibrillation with rapid ventricular response ([place]) 01/13/2018   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   Asthma 05/09/2017   Primary osteoarthritis of both first carpometacarpal joints 01/31/2016   Arthralgia of both hands 01/31/2016   Ventricular premature depolarization 09/11/2015   Psoriasis 07/16/2015   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   Hyperlipidemia 03/29/2015   Pre-syncope 03/04/2015   CAD (coronary artery disease)    PVC's (premature ventricular contractions)    Ischemic cardiomyopathy    DDD (degenerative disc disease), lumbar 01/18/2015   Intervertebral disc disorder with radiculopathy of lumbar region 12/26/2014   L-S radiculopathy 07/03/2014   Breast CA ([place]) 06/28/2014   Arthritis, degenerative 06/28/2014   Acid reflux 03/08/2014   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 1994 s/p CABG x 1 (LIMA->LAD); b. 03/2015 MV: No ischemia; c. MV 11/18: small  fixed apical defect likely secondary to breast attenuation, EF of 42%, frequent PVCs; d. 12/2017 Cath: LM nl, LAD 20p, D1/2/3 nl, LCX min irregs, OM1/2/3 min irregs, RCA nl, RPDA nl, RPL1/2 nl, LIMA->LAD atretic.   Chronic anticoagulation    Apixaban   Chronic combined systolic (congestive) and diastolic (congestive) heart failure ([place])    a. 2013 EF 40%;  b. 03/2015 Echo: EF 55-60%; c. 12/18 Echo: EF of 35-40%; d. 12/2017 TEE: EF 35-40%; e. 03/2018 Echo: EF 40-45%; f. 09/2018 Echo: EF 35%.   DDD (degenerative disc disease), lumbar    Dental crowns present    caps- left back top, right back bottom   Gastroesophageal reflux disease    Hyperlipidemia    Hypertension    LBBB (left bundle branch block)    MI (myocardial infarction) ([place]) 1994   Mixed Ischemic & Nonischemic cardiomyopathy    a. 2013 EF 40%;  b. 03/2015 Echo: EF 55-60%; c. 12/18 Echo: EF of 35-40%, hypokinesis of the anteroseptal, and apical myocardium, mild to mod MR, mod dil LA, nl RV fxn, PASP 53 mmHg; d. 12/2017 TEE: EF 35-40%, diff HK, mild to mod MR. small PFO. No LAA/RAA thrombus; e. 03/2018 Echo: EF 40-45%, antsept/inf HK, Gr2 DD, mild MR, mod idl LA, mild to mod TR, PASP 35-26mHg; f. 09/2018 Echo: EF 35%.   Osteoarthritis    left hip and knee   Persistent atrial fibrillation ([place]    a. diagnosed 01/13/2018; b. CHADS2VASc = 6 --> Eliquis; c. 12/2017 s/p TEE/DCCV. Amio started but d/c'd 01/2018 2/2 n/anorexia; d. 10/2018 DCCV-->recurrent Afib w/in days; e. 10/2018 DCCV x  2 in ED->persistent Afib.   Personal history of radiation therapy    PONV (postoperative nausea and vomiting)    Psoriasis    PSVT (paroxysmal supraventricular tachycardia) ([place])    a. 02/2015 Holter: short runs of SVT and NSVT.   Pulmonary hypertension (HCC)    PVC's (premature ventricular contractions)    a. 03/2018 24h Holter: Freq PVC's with a total of 421 beats in 24 hrs. 7 short runs of SVT likely representing Afib.   Rheumatoid arthritis ([place])    feet,  hands   Vertigo    approx 2x/yr     Transfusion: none   Consultants (if any): Treatment Team:  Nelva Bush, MD  Discharged Condition: Improved  Hospital Course: Teresa [name] is an 80 y.o. female who was admitted 07/11/2021 with a diagnosis of right hip osteoarthritis and went to the operating room on 07/11/2021 and underwent the above named procedures.    Surgeries: Procedure(s): TOTAL HIP ARTHROPLASTY ANTERIOR APPROACH on 07/11/2021 Patient tolerated the surgery well. Taken to PACU where she was stabilized and then transferred to the orthopedic floor.  Started on Eliquis.  Foot pumps applied bilaterally at 80 mm. Heels elevated on bed with rolled towels. No evidence of DVT. Negative Homan. Physical therapy started on day #1 for gait training and transfer. OT started day #1 for ADL and assisted devices.  Patient's foley was d/c on day #1. Patient's IV  was d/c on day #2.  On postop day 2, noted to be hypotensive.  Entresto held and metoprolol 50 mg XL decreased to 25 mg XL.  Cardiologist consulted, 1 dose of IV Lasix recommended and recommended to restart metoprolol 25 mg XL daily.  On post op day #3 patient BP back to baseline.  Vital signs stable.   On postop day 4 patient medically stable.  Slow progress with physical therapy.  Patient stable and ready for discharge to skilled nursing facility.  Patient blood pressures were soft, cardiology recommended discontinuing metoprolol.  Patient will follow-up with PCP/cardiologist at [facility] to discuss restarting metoprolol and/or Entresto  On postop day 5.  Patient stable and ready for discharge to skilled nursing facility.      ANTERIOR APPROACH TOTAL HIP REPLACEMENT POSTOPERATIVE DIRECTIONS   Hip Rehabilitation, Guidelines Following Surgery  The results of a hip operation are greatly improved after range of motion and muscle strengthening exercises. Follow all safety measures which are given to protect your hip. If any of these  exercises cause increased pain or swelling in your joint, decrease the amount until you are comfortable again. Then slowly increase the exercises. Call your caregiver if you have problems or questions.   HOME CARE INSTRUCTIONS  Remove items at home which could result in a fall. This includes throw rugs or furniture in walking pathways.  ICE to the affected hip every three hours for 30 minutes at a time and then as needed for pain and swelling.  Continue to use ice on the hip for pain and swelling from surgery. You may notice swelling that will progress down to the foot and ankle.  This is normal after surgery.  Elevate the leg when you are not up walking on it.   Continue to use the breathing machine which will help keep your temperature down.  It is common for your temperature to cycle up and down following surgery, especially at night when you are not up moving around and exerting yourself.  The breathing machine keeps your lungs expanded and your temperature  down. Do not place pillow under knee, focus on keeping the knee straight while resting  DIET You may resume your previous home diet once your are discharged from the hospital.  DRESSING / WOUND CARE / SHOWERING Please remove provena negative pressure dressing on 07/22/2021 and apply honey comb dressing. Keep dressing clean and dry at all times.   ACTIVITY Walk with your walker as instructed. Use walker as long as suggested by your caregivers. Avoid periods of inactivity such as sitting longer than an hour when not asleep. This helps prevent blood clots.  You may resume a sexual relationship in one month or when given the OK by your doctor.  You may return to work once you are cleared by your doctor.  Do not drive a car for 6 weeks or until released by you surgeon.  Do not drive while taking narcotics.  WEIGHT BEARING Weight bearing as tolerated. Use walker/cane as needed for at least 4 weeks post op.  POSTOPERATIVE CONSTIPATION  PROTOCOL Constipation - defined medically as fewer than three stools per week and severe constipation as less than one stool per week.  One of the most common issues patients have following surgery is constipation.  Even if you have a regular bowel pattern at home, your normal regimen is likely to be disrupted due to multiple reasons following surgery.  Combination of anesthesia, postoperative narcotics, change in appetite and fluid intake all can affect your bowels.  In order to avoid complications following surgery, here are some recommendations in order to help you during your recovery period.  Colace (docusate) - Pick up an over-the-counter form of Colace or another stool softener and take twice a day as long as you are requiring postoperative pain medications.  Take with a full glass of water daily.  If you experience loose stools or diarrhea, hold the colace until you stool forms back up.  If your symptoms do not get better within 1 week or if they get worse, check with your doctor.  Dulcolax (bisacodyl) - Pick up over-the-counter and take as directed by the product packaging as needed to assist with the movement of your bowels.  Take with a full glass of water.  Use this product as needed if not relieved by Colace only.   MiraLax (polyethylene glycol) - Pick up over-the-counter to have on hand.  MiraLax is a solution that will increase the amount of water in your bowels to assist with bowel movements.  Take as directed and can mix with a glass of water, juice, soda, coffee, or tea.  Take if you go more than two days without a movement. Do not use MiraLax more than once per day. Call your doctor if you are still constipated or irregular after using this medication for 7 days in a row.  If you continue to have problems with postoperative constipation, please contact the office for further assistance and recommendations.  If you experience "the worst abdominal pain ever" or develop nausea or  vomiting, please contact the office immediatly for further recommendations for treatment.  ITCHING  If you experience itching with your medications, try taking only a single pain pill, or even half a pain pill at a time.  You can also use Benadryl over the counter for itching or also to help with sleep.   TED HOSE STOCKINGS Wear the elastic stockings on both legs for six weeks following surgery during the day but you may remove then at night for sleeping.  MEDICATIONS See your  medication summary on the "After Visit Summary" that the nursing staff will review with you prior to discharge.  You may have some home medications which will be placed on hold until you complete the course of blood thinner medication.  It is important for you to complete the blood thinner medication as prescribed by your surgeon.  Continue your approved medications as instructed at time of discharge.  PRECAUTIONS If you experience chest pain or shortness of breath - call 911 immediately for transfer to the hospital emergency department.  If you develop a fever greater that 101 F, purulent drainage from wound, increased redness or drainage from wound, foul odor from the wound/dressing, or calf pain - CONTACT YOUR SURGEON.                                                   FOLLOW-UP APPOINTMENTS Make sure you keep all of your appointments after your operation with your surgeon and caregivers. You should call the office at the above phone number and make an appointment for approximately two weeks after the date of your surgery or on the date instructed by your surgeon outlined in the "After Visit Summary".  RANGE OF MOTION AND STRENGTHENING EXERCISES  These exercises are designed to help you keep full movement of your hip joint. Follow your caregiver's or physical therapist's instructions. Perform all exercises about fifteen times, three times per day or as directed. Exercise both hips, even if you have had only one joint  replacement. These exercises can be done on a training (exercise) mat, on the floor, on a table or on a bed. Use whatever works the best and is most comfortable for you. Use music or television while you are exercising so that the exercises are a pleasant break in your day. This will make your life better with the exercises acting as a break in routine you can look forward to.  Lying on your back, slowly slide your foot toward your buttocks, raising your knee up off the floor. Then slowly slide your foot back down until your leg is straight again.  Lying on your back spread your legs as far apart as you can without causing discomfort.  Lying on your side, raise your upper leg and foot straight up from the floor as far as is comfortable. Slowly lower the leg and repeat.  Lying on your back, tighten up the muscle in the front of your thigh (quadriceps muscles). You can do this by keeping your leg straight and trying to raise your heel off the floor. This helps strengthen the largest muscle supporting your knee.  Lying on your back, tighten up the muscles of your buttocks both with the legs straight and with the knee bent at a comfortable angle while keeping your heel on the floor.   IF YOU ARE TRANSFERRED TO A SKILLED REHAB FACILITY If the patient is transferred to a skilled rehab facility following release from the hospital, a list of the current medications will be sent to the facility for the patient to continue.  When discharged from the skilled rehab facility, please have the facility set up the patient's [place] prior to being released. Also, the skilled facility will be responsible for providing the patient with their medications at time of release from the facility to include their pain medication, the  muscle relaxants, and their blood thinner medication. If the patient is still at the rehab facility at time of the two week follow up appointment, the skilled rehab facility will  also need to assist the patient in arranging follow up appointment in our office and any transportation needs.  MAKE SURE YOU:  Understand these instructions.  Get help right away if you are not doing well or get worse.    Pick up stool softner and laxative for home use following surgery while on pain medications. Continue to use ice for pain and swelling after surgery. Do not use any lotions or creams on the incision until instructed by your surgeon.            She was given perioperative antibiotics:  Anti-infectives (From admission, onward)    Start     Dose/Rate Route Frequency Ordered Stop   07/11/21 1600  ceFAZolin (ANCEF) IVPB 2g/100 mL premix        2 g 200 mL/hr over 30 Minutes Intravenous Every 6 hours 07/11/21 1341 07/11/21 2230   07/11/21 0739  ceFAZolin (ANCEF) 2-4 GM/100ML-% IVPB       Note to Pharmacy: Maryagnes Amos   : cabinet override      07/11/21 0739 07/11/21 1039   07/11/21 0600  ceFAZolin (ANCEF) IVPB 2g/100 mL premix        2 g 200 mL/hr over 30 Minutes Intravenous On call to O.R. 07/10/21 2300 07/11/21 1039     .  She was given sequential compression devices, early ambulation, and Eliquis, teds for DVT prophylaxis.  She benefited maximally from the hospital stay and there were no complications.    Recent vital signs:  Vitals:   07/16/21 0541 07/16/21 0716  BP: 140/65 (!) 140/59  Pulse: 71 69  Resp: 20 15  Temp: 97.9 F (36.6 C) 98.3 F (36.8 C)  SpO2: 95% 96%    Recent laboratory studies:  Lab Results  Component Value Date   HGB 10.2 (L) 07/13/2021   HGB 12.9 07/12/2021   HGB 13.4 07/02/2021   Lab Results  Component Value Date   WBC 6.6 07/13/2021   PLT 173 07/13/2021   Lab Results  Component Value Date   INR 0.99 01/13/2018   Lab Results  Component Value Date   NA 139 07/12/2021   K 4.7 07/12/2021   CL 108 07/12/2021   CO2 27 07/12/2021   BUN 17 07/12/2021   CREATININE 0.81 07/12/2021   GLUCOSE 134 (H)  07/12/2021    Discharge Medications:   Allergies as of 07/16/2021       Reactions   Sulfa Antibiotics Hives   Ivp Dye [iodinated Diagnostic Agents] Swelling   Amiodarone Nausea And Vomiting, Cough   Codeine Other (See Comments)   ALTERED MENTAL STATUS        Medication List     STOP taking these medications    Entresto 24-26 MG Generic drug: sacubitril-valsartan   metoprolol succinate 50 MG 24 hr tablet Commonly known as: TOPROL-XL       TAKE these medications    acetaminophen 650 MG CR tablet Commonly known as: TYLENOL Take 650 mg by mouth every 8 (eight) hours as needed for pain.   apixaban 5 MG Tabs tablet Commonly known as: ELIQUIS Take 1 tablet (5 mg total) by mouth 2 (two) times daily.   budesonide-formoterol 160-4.5 MCG/ACT inhaler Commonly known as: SYMBICORT Inhale 2 puffs into the lungs 2 (two) times daily. What changed:  when to take  this reasons to take this   cetirizine 10 MG tablet Commonly known as: ZYRTEC Take 1 tablet (10 mg total) by mouth daily. What changed:  when to take this reasons to take this   docusate sodium 100 MG capsule Commonly known as: COLACE Take 1 capsule (100 mg total) by mouth 2 (two) times daily.   donepezil 5 MG tablet Commonly known as: ARICEPT TAKE 1 TABLET BY MOUTH EVERYDAY AT BEDTIME What changed: See the new instructions.   fluticasone 50 MCG/ACT nasal spray Commonly known as: FLONASE Place 2 sprays into both nostrils daily as needed for allergies.   furosemide 20 MG tablet Commonly known as: LASIX TAKE 1 TABLET (20 MG TOTAL) BY MOUTH DAILY AS NEEDED (SHORTNESS OF BREATH).   gabapentin 300 MG capsule Commonly known as: NEURONTIN Take 2 capsules (600 mg total) by mouth 2 (two) times daily.   HYDROcodone-acetaminophen 5-325 MG tablet Commonly known as: NORCO/VICODIN Take 1-2 tablets by mouth every 4 (four) hours as needed for moderate pain (pain score 4-6).   methocarbamol 500 MG tablet Commonly  known as: ROBAXIN Take 1 tablet (500 mg total) by mouth every 6 (six) hours as needed for muscle spasms.   MULTIVITAMIN PO Take 1 tablet by mouth daily.   Otezla 30 MG Tabs Generic drug: Apremilast Take 30 mg by mouth in the morning and at bedtime.   rosuvastatin 5 MG tablet Commonly known as: CRESTOR Take 1 tablet (5 mg total) by mouth daily.   traMADol 50 MG tablet Commonly known as: ULTRAM Take 1 tablet (50 mg total) by mouth every 6 (six) hours as needed. What changed: reasons to take this   venlafaxine 75 MG tablet Commonly known as: EFFEXOR TAKE 2 TABLETS (150 MG TOTAL) BY MOUTH DAILY. What changed: See the new instructions.        Diagnostic Studies: DG HIP OPERATIVE UNILAT W OR W/O PELVIS RIGHT  Result Date: 07/11/2021 CLINICAL DATA:  Total right hip replacement EXAM: OPERATIVE right HIP (WITH PELVIS IF PERFORMED) 1 VIEWS TECHNIQUE: Fluoroscopic spot image(s) were submitted for interpretation post-operatively. COMPARISON:  Radiograph 02/22/2021 FINDINGS: Intraoperative radiograph right hip arthroplasty. Normal alignment without evidence of complication on this limited single frontal view. IMPRESSION: Intraoperative radiograph for right hip arthroplasty. Normal alignment without complication on limited single frontal view. Electronically Signed   By: Maurine Simmering   On: 07/11/2021 14:04   DG HIP UNILAT W OR W/O PELVIS 2-3 VIEWS RIGHT  Result Date: 07/11/2021 CLINICAL DATA:  Right hip arthroplasty EXAM: DG HIP (WITH OR WITHOUT PELVIS) 2-3V RIGHT COMPARISON:  02/22/2021 FINDINGS: Interval postsurgical changes from right hip arthroplasty. Arthroplasty components are in their expected alignment. No periprosthetic fracture or evidence of other complication. Expected postoperative changes within the overlying soft tissues. Incompletely visualized left hip arthroplasty appears intact. IMPRESSION: Satisfactory postoperative appearance status post right hip arthroplasty. Electronically  Signed   By: Davina Poke D.O.   On: 07/11/2021 13:27    Disposition: Discharge disposition: 03-Skilled [place] information for follow-up providers     Duanne Guess, PA-C Follow up on 07/26/2021.   Specialties: Orthopedic Surgery, Emergency Medicine Why: at 2pm Contact information: [place] [address] [phone]              Contact information for after-discharge care     Destination     [facility] Preferred SNF .   Service: Skilled Nursing Contact information: [id number]  [place] [phone]                      Signed: Feliberto Gottron 07/16/2021, 9:51 AM

## 2021-07-14 NOTE — Progress Notes (Signed)
Progress Note  Patient Name: OLUWATENIOLA LUTMAN Date of Encounter: 07/14/2021  [facility] Cardiologist:[facility]   Subjective   Patient denies SOB  No CP  Daughter says yesterday when pt got up for PT BP went low   Inpatient Medications    Scheduled Meds:  apixaban  5 mg Oral BID   Apremilast  30 mg Oral Daily   docusate sodium  100 mg Oral BID   donepezil  5 mg Oral QHS   gabapentin  600 mg Oral BID   loratadine  10 mg Oral Daily   metoprolol succinate  25 mg Oral Daily   mometasone-formoterol  2 puff Inhalation BID   rosuvastatin  5 mg Oral Daily   sacubitril-valsartan  1 tablet Oral BID   traMADol  50 mg Oral Q6H   venlafaxine  75 mg Oral QHS   Continuous Infusions:  methocarbamol (ROBAXIN) IV     PRN Meds: acetaminophen, alum & mag hydroxide-simeth, bisacodyl, fluticasone, HYDROcodone-acetaminophen, HYDROcodone-acetaminophen, menthol-cetylpyridinium **OR** phenol, methocarbamol **OR** methocarbamol (ROBAXIN) IV, metoCLOPramide **OR** metoCLOPramide (REGLAN) injection, morphine injection, ondansetron **OR** ondansetron (ZOFRAN) IV, polyethylene glycol, zolpidem   Vital Signs    Vitals:   07/13/21 1640 07/13/21 2055 07/14/21 0511 07/14/21 0821  BP: (!) 132/50 (!) 130/41 (!) 128/55 (!) 132/53  Pulse: 68 69 75 80  Resp: '17 16 14 17  '$ Temp: 98.6 F (37 C) 98.5 F (36.9 C) 99.5 F (37.5 C) 98.9 F (37.2 C)  TempSrc:  Oral Oral Oral  SpO2: 99% 100% 98% 95%  Weight:      Height:        Intake/Output Summary (Last 24 hours) at 07/14/2021 0940 Last data filed at 07/14/2021 0448 Gross per 24 hour  Intake 842.02 ml  Output 2300 ml  Net -1457.98 ml   Last 3 Weights 07/11/2021 07/02/2021 02/22/2021  Weight (lbs) 176 lb 175 lb 171 lb 1.2 oz  Weight (kg) 79.833 kg 79.379 kg 77.6 kg      Telemetry     Not on - Personally Reviewed  ECG     - Personally Reviewed  Physical Exam   GEN: No acute distress.   Neck: No JVD Cardiac: RRR, no murmurs, Respiratory: Clear to  auscultation bilaterally. GI: Soft, nontender, non-distended  MS: No edema; No deformity. Neuro:  Nonfocal  Psych: Normal affect   Labs    High Sensitivity Troponin:  No results for input(s): TROPONINIHS in the last 720 hours.    Chemistry Recent Labs  Lab 07/12/21 0431  NA 139  K 4.7  CL 108  CO2 27  GLUCOSE 134*  BUN 17  CREATININE 0.81  CALCIUM 8.8*  GFRNONAA >60  ANIONGAP 4*     Hematology Recent Labs  Lab 07/12/21 0431 07/13/21 0550  WBC 6.8 6.6  RBC 4.00 3.21*  HGB 12.9 10.2*  HCT 38.2 31.8*  MCV 95.5 99.1  MCH 32.3 31.8  MCHC 33.8 32.1  RDW 13.2 13.6  PLT 225 173    BNPNo results for input(s): BNP, PROBNP in the last 168 hours.   DDimer No results for input(s): DDIMER in the last 168 hours.   Radiology    No results found.  Cardiac Studies   None   Patient Profile      MAIDAH SHORB is a 80 y.o. female with a hx of Ischemic cardiomyopathy who is being seen 07/13/2021 for the evaluation of hypotension at the request of Rudene Christians.  Assessment & Plan    1  Hypotension  BP is OK    She is about to get PT   I would follow   Got Entresto and toprol this AM   I have held PM entresto   Again, want to avoid hypotension    OK in short term   Reviewed with daughter and pt  2  Chronic systolic CHF   Followed at [facility]   was on toprol XL 25 bid and Entresto 24/26 bid    I have lowered doses due to low BP   Follow   Can slowly increase if toelrates PT /changes in positoin  She diuresed approx 1 L yesterday  Volume looks better   I would not add any diuretic back  3  Afib     s/p ablation   Followed at [facility] on eliquis  4  CAD  Remote cabg  No angina  5  HL   Contnue crestor       Will sign off   Call with questions   Again, pt will follow up at [place] MD    For questions or updates, please contact [place] Please consult [email or web address] for contact info under        Signed, Dorris Carnes, MD  07/14/2021, 9:40 AM

## 2021-07-15 DIAGNOSIS — I9581 Postprocedural hypotension: Secondary | ICD-10-CM

## 2021-07-15 LAB — SURGICAL PATHOLOGY

## 2021-07-15 NOTE — Progress Notes (Signed)
Physical Therapy Treatment Patient Details Name: Teresa [name] MRN: [id number] DOB: 29-Apr-1941 Today's Date: 07/15/2021    History of Present Illness Pt. was admitted to [facility] for elective Right THA, direct anterior approach.  PMH: MI, DC cardioversion x2 2019, CAD s/p CABG 1994, combined CHF HTN, , Rt foot 1st ray arthrodesis 2017, Left, THA, Left TKA, Left BrCA s/p lumpectomy, Cervical fusion, RA, psoraiasis, vertigo, PONV.    PT Comments    Pt ready for session.  To EOB with mod a x 1 - increased physical and verbal cues for mobility today.  Stood and transferred to commode with min a x 1 to void.  She stands and takes about 3 steps before stopping stating she is dizzy.  It does not clear and she takes steps backwards to sit in chair.  Pt c/o continued dizziness and diaphoretic.  BP monitor obtained and BP 143/57 P 74.  She reports some improvement.  Stood and she is unable to remain standing for orthostatic BP's but sitting 116/53 P 71.  LE's are elevated and she remains in recliner stating she is comfortable and feeling better.  Discussed response to PT session with RN.  Stated she had Ambien last night to sleep and could be contributing to issues this am.  Pt stated and RN confirmed some hallucinations this am - thought she was in bed in stairwell causing anxiety.     Follow Up Recommendations  SNF;Supervision/Assistance - 24 hour;Supervision for mobility/OOB     Equipment Recommendations  Other (comment) (defer to next level of care)    Recommendations for Other Services       Precautions / Restrictions Precautions Precautions: Fall Restrictions Weight Bearing Restrictions: Yes RLE Weight Bearing: Weight bearing as tolerated    Mobility  Bed Mobility Overal bed mobility: Needs Assistance Bed Mobility: Supine to Sit     Supine to sit: Mod assist     General bed mobility comments: increased assist today    Transfers Overall transfer level: Needs  assistance Equipment used: Rolling walker (2 wheeled) Transfers: Sit to/from Stand Sit to Stand: Min assist            Ambulation/Gait Ambulation/Gait assistance: Min assist Gait Distance (Feet): 3 Feet Assistive device: Rolling walker (2 wheeled) Gait Pattern/deviations: Step-to pattern;Antalgic;Trunk flexed;Narrow base of support Gait velocity: decreased   General Gait Details: limied by dizziness and diaphoretic   Stairs             Wheelchair Mobility    Modified Rankin (Stroke Patients Only)       Balance Overall balance assessment: Needs assistance   Sitting balance-Leahy Scale: Good     Standing balance support: Bilateral upper extremity supported Standing balance-Leahy Scale: Fair Standing balance comment: reliant on UE support during dynamic activity                            Cognition Arousal/Alertness: Awake/alert Behavior During Therapy: WFL for tasks assessed/performed Overall Cognitive Status: Within Functional Limits for tasks assessed                                        Exercises Other Exercises Other Exercises: to commode to void    General Comments        Pertinent Vitals/Pain Pain Assessment: Faces Faces Pain Scale: Hurts little more Pain Location: operative hip Pain Descriptors /  Indicators: Aching;Sore;Grimacing;Guarding Pain Intervention(s): Limited activity within patient's tolerance;Monitored during session;Premedicated before session;Repositioned    Home Living                      Prior Function            PT Goals (current goals can now be found in the care plan section) Progress towards PT goals: Progressing toward goals    Frequency    BID      PT Plan Current plan remains appropriate    Co-evaluation              AM-PAC PT "6 Clicks" Mobility   Outcome Measure  Help needed turning from your back to your side while in a flat bed without using  bedrails?: A Lot Help needed moving from lying on your back to sitting on the side of a flat bed without using bedrails?: A Lot Help needed moving to and from a bed to a chair (including a wheelchair)?: A Little Help needed standing up from a chair using your arms (e.g., wheelchair or bedside chair)?: A Little Help needed to walk in hospital room?: A Little Help needed climbing 3-5 steps with a railing? : A Lot 6 Click Score: 15    End of Session Equipment Utilized During Treatment: Gait belt Activity Tolerance: Patient tolerated treatment well Patient left: Other (comment) (on [facility] with RN staff aware and daughter present) Nurse Communication: Mobility status PT Visit Diagnosis: Unsteadiness on feet (R26.81);Other abnormalities of gait and mobility (R26.89);Muscle weakness (generalized) (M62.81);Difficulty in walking, not elsewhere classified (R26.2)     Time: 8022-1798 PT Time Calculation (min) (ACUTE ONLY): 23 min  Charges:  $Gait Training: 8-22 mins $Therapeutic Activity: 8-22 mins                    Gail Vendetti, PTA 07/15/21, 10:00 AM , 9:55 AM

## 2021-07-15 NOTE — Care Management Important Message (Signed)
Important Message  Patient Details  Name: Teresa [name] MRN: [id number] Date of Birth: 1941-01-22   Medicare Important Message Given:  Yes     Dannette Barbara 07/15/2021, 11:56 AM

## 2021-07-15 NOTE — Progress Notes (Addendum)
Subjective: 4 Days Post-Op Procedure(s) (LRB): TOTAL HIP ARTHROPLASTY ANTERIOR APPROACH (Right) Patient reports pain as mild.   Vital signs are stable.  Blood pressure within normal limits.  Pain well controlled.  No complaints of chest pain, shortness of breath, abdominal pain, nausea or vomiting.  Overall right hip pain/swelling improving. Plan is to go Skilled nursing facility after hospital stay.  Objective: Vital signs in last 24 hours: Temp:  [98 F (36.7 C)-98.9 F (37.2 C)] 98.4 F (36.9 C) (07/24 2055) Pulse Rate:  [68-80] 70 (07/24 2055) Resp:  [16-18] 16 (07/24 2055) BP: (131-151)/(53-56) 138/53 (07/24 2055) SpO2:  [95 %-100 %] 100 % (07/24 2055)  Intake/Output from previous day: 07/24 0701 - 07/25 0700 In: 480 [P.O.:480] Out: 650 [Urine:650] Intake/Output this shift: No intake/output data recorded.  Recent Labs    07/13/21 0550  HGB 10.2*   Recent Labs    07/13/21 0550  WBC 6.6  RBC 3.21*  HCT 31.8*  PLT 173   No results for input(s): NA, K, CL, CO2, BUN, CREATININE, GLUCOSE, CALCIUM in the last 72 hours.  No results for input(s): LABPT, INR in the last 72 hours.  EXAM General - Patient is Alert, Appropriate, and Oriented Extremity - Neurovascular intact Sensation intact distally Intact pulses distally Dorsiflexion/Plantar flexion intact Dressing - dressing C/D/I and no drainage, Praveena intact without drainage Motor Function - intact, moving foot and toes well on exam.   Past Medical History:  Diagnosis Date   Arthritis    Asthma    Back pain    Breast cancer, left ([place]) 2009   CAD (coronary artery disease)    a. 1994 s/p CABG x 1 (LIMA->LAD); b. 03/2015 MV: No ischemia; c. MV 11/18: small fixed apical defect likely secondary to breast attenuation, EF of 42%, frequent PVCs; d. 12/2017 Cath: LM nl, LAD 20p, D1/2/3 nl, LCX min irregs, OM1/2/3 min irregs, RCA nl, RPDA nl, RPL1/2 nl, LIMA->LAD atretic.   Chronic anticoagulation    Apixaban    Chronic combined systolic (congestive) and diastolic (congestive) heart failure ([place])    a. 2013 EF 40%;  b. 03/2015 Echo: EF 55-60%; c. 12/18 Echo: EF of 35-40%; d. 12/2017 TEE: EF 35-40%; e. 03/2018 Echo: EF 40-45%; f. 09/2018 Echo: EF 35%.   DDD (degenerative disc disease), lumbar    Dental crowns present    caps- left back top, right back bottom   Gastroesophageal reflux disease    Hyperlipidemia    Hypertension    LBBB (left bundle branch block)    MI (myocardial infarction) ([place]) 1994   Mixed Ischemic & Nonischemic cardiomyopathy    a. 2013 EF 40%;  b. 03/2015 Echo: EF 55-60%; c. 12/18 Echo: EF of 35-40%, hypokinesis of the anteroseptal, and apical myocardium, mild to mod MR, mod dil LA, nl RV fxn, PASP 53 mmHg; d. 12/2017 TEE: EF 35-40%, diff HK, mild to mod MR. small PFO. No LAA/RAA thrombus; e. 03/2018 Echo: EF 40-45%, antsept/inf HK, Gr2 DD, mild MR, mod idl LA, mild to mod TR, PASP 35-11mHg; f. 09/2018 Echo: EF 35%.   Osteoarthritis    left hip and knee   Persistent atrial fibrillation ([place]    a. diagnosed 01/13/2018; b. CHADS2VASc = 6 --> Eliquis; c. 12/2017 s/p TEE/DCCV. Amio started but d/c'd 01/2018 2/2 n/anorexia; d. 10/2018 DCCV-->recurrent Afib w/in days; e. 10/2018 DCCV x 2 in ED->persistent Afib.   Personal history of radiation therapy    PONV (postoperative nausea and vomiting)    Psoriasis  PSVT (paroxysmal supraventricular tachycardia) ([place])    a. 02/2015 Holter: short runs of SVT and NSVT.   Pulmonary hypertension (HCC)    PVC's (premature ventricular contractions)    a. 03/2018 24h Holter: Freq PVC's with a total of 421 beats in 24 hrs. 7 short runs of SVT likely representing Afib.   Rheumatoid arthritis (HCC)    feet, hands   Vertigo    approx 2x/yr    Assessment/Plan:   4 Days Post-Op Procedure(s) (LRB): TOTAL HIP ARTHROPLASTY ANTERIOR APPROACH (Right) Active Problems:   Status post total hip replacement, right  Estimated body mass index is 30.21 kg/m as  calculated from the following:   Height as of this encounter: '5\' 4"'$  (1.626 m).   Weight as of this encounter: 79.8 kg. Advance diet Up with therapy Work on bowel movement Vital signs are stable.  Blood pressure improved. Pain well controlled.  Care management to assist with discharge to skilled nursing facility.  Patient stable and ready for discharge to SNF today.  DVT Prophylaxis - TED hose and Eliquis, SCDs Weight-Bearing as tolerated to right leg   T. Rachelle Hora, PA-C [place] 07/15/2021, 7:22 AM

## 2021-07-15 NOTE — Progress Notes (Signed)
Progress Note  Patient Name: Teresa [name] Date of Encounter: 07/15/2021  Primary Cardiologist: [facility]  Subjective   No chest pain, dyspnea, or palpitations. She did note an episode of dizziness with ambulation earlier. BP soft this morning in the 123XX123 systolic, currently improved to the low 123XX123 mmHg systolic. Last dose of Entresto 7/24 at 0831. Last dose of Toprol XL 7/24 at 0832.  Inpatient Medications    Scheduled Meds:  apixaban  5 mg Oral BID   Apremilast  30 mg Oral Daily   docusate sodium  100 mg Oral BID   donepezil  5 mg Oral QHS   gabapentin  600 mg Oral BID   loratadine  10 mg Oral Daily   mometasone-formoterol  2 puff Inhalation BID   rosuvastatin  5 mg Oral Daily   traMADol  50 mg Oral Q6H   venlafaxine  75 mg Oral QHS   Continuous Infusions:  methocarbamol (ROBAXIN) IV     PRN Meds: acetaminophen, alum & mag hydroxide-simeth, bisacodyl, fluticasone, HYDROcodone-acetaminophen, HYDROcodone-acetaminophen, menthol-cetylpyridinium **OR** phenol, methocarbamol **OR** methocarbamol (ROBAXIN) IV, metoCLOPramide **OR** metoCLOPramide (REGLAN) injection, morphine injection, ondansetron **OR** ondansetron (ZOFRAN) IV, polyethylene glycol, zolpidem   Vital Signs    Vitals:   07/14/21 2016 07/14/21 2055 07/15/21 0809 07/15/21 0934  BP: (!) 151/53 (!) 138/53 (!) 81/65 (!) 107/44  Pulse: 73 70 72 70  Resp: '17 16 15   '$ Temp: 98.2 F (36.8 C) 98.4 F (36.9 C) 98 F (36.7 C)   TempSrc:      SpO2: 100% 100% 96%   Weight:      Height:        Intake/Output Summary (Last 24 hours) at 07/15/2021 1108 Last data filed at 07/15/2021 1013 Gross per 24 hour  Intake 360 ml  Output 650 ml  Net -290 ml   Filed Weights   07/11/21 0752  Weight: 79.8 kg    Telemetry    Not on telemetry - Personally Reviewed  ECG    No new tracings - Personally Reviewed  Physical Exam   GEN: No acute distress.   Neck: No JVD. Cardiac: RRR, no murmurs, rubs, or gallops.   Respiratory: Clear to auscultation bilaterally.  GI: Soft, nontender, non-distended.   MS: No edema; No deformity. Anterior aspect of L lower extremity TTP.  Neuro:  Alert and oriented x 3; Nonfocal.  Psych: Normal affect.  Labs    Chemistry Recent Labs  Lab 07/12/21 0431  NA 139  K 4.7  CL 108  CO2 27  GLUCOSE 134*  BUN 17  CREATININE 0.81  CALCIUM 8.8*  GFRNONAA >60  ANIONGAP 4*     Hematology Recent Labs  Lab 07/12/21 0431 07/13/21 0550  WBC 6.8 6.6  RBC 4.00 3.21*  HGB 12.9 10.2*  HCT 38.2 31.8*  MCV 95.5 99.1  MCH 32.3 31.8  MCHC 33.8 32.1  RDW 13.2 13.6  PLT 225 173    Cardiac EnzymesNo results for input(s): TROPONINI in the last 168 hours. No results for input(s): TROPIPOC in the last 168 hours.   BNPNo results for input(s): BNP, PROBNP in the last 168 hours.   DDimer No results for input(s): DDIMER in the last 168 hours.   Radiology    No results found.  Cardiac Studies   Echo 05/2021 ([facility]): INTERPRETATION ---------------------------------------------------------------    MILD LV DYSFUNCTION (See above) WITH MILD LVH    ELEVATED LA PRESSURES WITH DIASTOLIC DYSFUNCTION    NORMAL RIGHT VENTRICULAR SYSTOLIC FUNCTION  VALVULAR REGURGITATION: MILD MR, TRIVIAL PR, MILD TR    NO VALVULAR STENOSIS    R>L IAFC    RIGHT TO LEFT IAFC SEEN BY COLOR AND DOPPLER, MAX VELOCITY 1.80ms (131mg)    UNABLE TO OBTAIN IV ACCESS FOR USE OF DEFINITY    3D acquisition and reconstructions were performed as part of this    examination to more accurately quantify the effects of reduced left    ventricular ejection fraction. (post-processing on an Independent    workstation).     Compared with prior Echo study on 05/31/2021: ESTIMATED RVSP HAS INCREASED    RV HAS IMPROVED IN SIZE AND FUNCTION    NO SIGNIFICANT CHANGES IN LV FUNCTION  __________  Echo 08/2020 ([facility]): ECHOCARDIOGRAPHIC DESCRIPTIONS -----------------------------------------------  AORTIC ROOT           Size: Normal    Dissection: INDETERM FOR DISSECTION   AORTIC VALVE      Leaflets: Tricuspid             Morphology: Normal      Mobility: Fully Mobile   LEFT VENTRICLE                                      Anterior: HYPOCONTRACTILE          Size: Normal                                 Lateral: HYPOCONTRACTILE   Contraction: MILD GLOBAL DECREASE                    Septal: HYPOCONTRACTILE    Closest EF: 45% (Estimated)  Calc.EF: 46% (3D)      Apical: HYPOCONTRACTILE     LV masses: No Masses                             Inferior: HYPOCONTRACTILE           LVH: MILD LVH                             Posterior: HYPOCONTRACTILE  Dias.FxClass: RELAXATION ABNORMALITY (GRADE 2) CORRESPONDS TO PSEUDONORMAL   MITRAL VALVE      Leaflets: Normal                  Mobility: Fully mobile    Morphology: Normal   LEFT ATRIUM          Size: SEVERELY ENLARGED     LA masses: No masses                Normal IAS   MAIN PA          Size: DILATED       PA Note:  PAAT = 81 msec   PULMONIC VALVE    Morphology: Normal      Mobility: Fully Mobile   RIGHT VENTRICLE          Size: MODERATELY ENLARGED       Free wall: HYPOCONTRACTILE   Contraction: MILD GLOBAL DECREASE      RV masses: No Masses       RV Note: SYSTOLIC ANNULAR VELOCITY = 10.6 cm/sec   TRICUSPID VALVE      Leaflets: Normal  Mobility: Fully mobile    Morphology: Normal   RIGHT ATRIUM          Size: MILDLY ENLARGED            RA Other: None     RA masses: EUSTACHIAN VALVE   PERICARDIUM         Fluid: No effusion   INFERIOR VENACAVA          Size: Normal     Normal respiratory collapse   DOPPLER ECHO and OTHER SPECIAL PROCEDURES ------------------------------------     Aortic: No AR                  No AS      Mitral: MILD MR                No MS     MV Inflow E Vel.= 74.0 cm/s  MV Annulus E'Vel.= 7.5 cm/s  E/E'Ratio= 10   Tricuspid: MILD TR                No TS             3.0 m/s [facility] TR vel   40 mmHg peak RV  pressure   Pulmonary: No PR                  No PS       Other:             DEFINITY CONTRAST SHOWS ENHANCED LV BORDERS   INTERPRETATION ---------------------------------------------------------------    MILD LV DYSFUNCTION (See above) WITH MILD LVH    ELEVATED LA PRESSURES WITH DIASTOLIC DYSFUNCTION    MILD RV SYSTOLIC DYSFUNCTION (See above)    VALVULAR REGURGITATION: MILD MR, MILD TR    NO VALVULAR STENOSIS    MODERATE IAFC    FREQUENT ECTOPY THROUGHOUT EXAM.    RIGHT TO LEFT IAFC NOTED BY COLOR AND DOPPLER, MAX VELOCITY 1.8 m/sec (13    mmHg).    PROMINENT EUSTACHIAN VALVE IN RA.    3D acquisition and reconstructions were performed as part of this    examination to more accurately quantify the effects of reduced left    ventricular ejection fraction.     Compared with prior Echo study on 08/12/2019: LV FUNCTION SLIGHTLY BETTER  Patient Profile     80 y.o. female with history of CAD with MI in 1994 s/p 1-vessel CABG with LIMA to LAD, HFrEF secondary to ICM, and Afib s/p PVI in 05/2019 on Eliquis who we are seeing for postoperative hypotension.   Assessment & Plan    1. Post operative hypotension: -BP currently soft, though stable, cannot exclude some degree of dehydration following IV Lasix -Continue to hold Entresto -We will hold Toprol XL as well given soft BP persists -Resumption of GDMT per primary cardiology group -Maintain adequate oral intake/hydration  -Work with PT  2. Chronic HFrEF/ICM: -She appears euvolemic and well compensated s/p one dose of IV Lasix on 7/23 -GDMT on hold as above, resume as able -Would not place on standing diuretic   3. Afib: -Status post PVI in 2020 -Toprol held as above -Eliquis  -CHADS2VASc 5 (CHF, age x 2, vascular disease, sex category)  4. CAD s/p remote 1-vessel CABG: -No symptoms of angina -On Eliquis in place of ASA to minimize bleeding risk  5. HLD: -PTA Crestor  6. Anemia: -Likely in the postoperative setting -No  obvious bleeding   For questions or updates, please contact [place] Please consult  [email or web address] for contact info under Cardiology/STEMI.    Signed, Christell Faith, PA-C [place] Pager: [phone] 07/15/2021, 11:08 AM

## 2021-07-15 NOTE — TOC Progression Note (Addendum)
Transition of Care [facility]) - Progression Note    Patient Details  Name: Teresa [name] MRN: [id number] Date of Birth: May 26, 1941  Transition of Care [facility]) CM/SW [place], RN Phone Number: 07/15/2021, 8:34 AM  Clinical Narrative:     Reached out to [facility] to inquire about Insurance approval, awaiting a response  Update, Per Tammy at [facility], Auth still pending   Checked back with Tammy again, the Josem Kaufmann is still pending    Expected Discharge Plan and Services           Expected Discharge Date: 07/15/21                                     Social Determinants of Health (SDOH) Interventions    Readmission Risk Interventions No flowsheet data found.

## 2021-07-15 NOTE — Progress Notes (Signed)
Physical Therapy Treatment Patient Details Name: Teresa [name] MRN: [id number] DOB: [birth year] Today's Date: 07/15/2021    History of Present Illness Pt. was admitted to [facility] for elective Right THA, direct anterior approach.  PMH: MI, DC cardioversion x2 2019, CAD s/p CABG 1994, combined CHF HTN, , Rt foot 1st ray arthrodesis 2017, Left, THA, Left TKA, Left BrCA s/p lumpectomy, Cervical fusion, RA, psoraiasis, vertigo, PONV.    PT Comments    Feeling better.  Is able to stand and walk to door and back with slow but steady gait.  No dizziness noted this session.  Follow Up Recommendations  SNF;Supervision/Assistance - 24 hour;Supervision for mobility/OOB     Equipment Recommendations  Other (comment) (defer to next level of care)    Recommendations for Other Services       Precautions / Restrictions Precautions Precautions: Fall Restrictions Weight Bearing Restrictions: Yes RLE Weight Bearing: Weight bearing as tolerated    Mobility  Bed Mobility                    Transfers Overall transfer level: Needs assistance Equipment used: Rolling walker (2 wheeled) Transfers: Sit to/from Stand Sit to Stand: Min assist            Ambulation/Gait Ambulation/Gait assistance: Min assist Gait Distance (Feet): 25 Feet Assistive device: Rolling walker (2 wheeled) Gait Pattern/deviations: Step-to pattern;Antalgic;Trunk flexed;Narrow base of support Gait velocity: decreased   General Gait Details: no dizziness this session   Stairs             Wheelchair Mobility    Modified Rankin (Stroke Patients Only)       Balance Overall balance assessment: Needs assistance Sitting-balance support: Bilateral upper extremity supported;Feet supported Sitting balance-Leahy Scale: Good     Standing balance support: Bilateral upper extremity supported Standing balance-Leahy Scale: Fair Standing balance comment: reliant on UE support during dynamic activity                             Cognition Arousal/Alertness: Awake/alert Behavior During Therapy: WFL for tasks assessed/performed Overall Cognitive Status: Within Functional Limits for tasks assessed                                        Exercises      General Comments        Pertinent Vitals/Pain Pain Assessment: Faces Faces Pain Scale: Hurts little more Pain Location: operative hip Pain Descriptors / Indicators: Burning Pain Intervention(s): Limited activity within patient's tolerance;Monitored during session;Premedicated before session;Repositioned    Home Living                      Prior Function            PT Goals (current goals can now be found in the care plan section) Progress towards PT goals: Progressing toward goals    Frequency    BID      PT Plan Current plan remains appropriate    Co-evaluation              AM-PAC PT "6 Clicks" Mobility   Outcome Measure  Help needed turning from your back to your side while in a flat bed without using bedrails?: A Lot Help needed moving from lying on your back to sitting on the side of a flat bed without  using bedrails?: A Lot Help needed moving to and from a bed to a chair (including a wheelchair)?: A Little Help needed standing up from a chair using your arms (e.g., wheelchair or bedside chair)?: A Little Help needed to walk in hospital room?: A Little Help needed climbing 3-5 steps with a railing? : A Lot 6 Click Score: 15    End of Session Equipment Utilized During Treatment: Gait belt Activity Tolerance: Patient tolerated treatment well Patient left: Other (comment) (on [facility] with RN staff aware and daughter present) Nurse Communication: Mobility status PT Visit Diagnosis: Unsteadiness on feet (R26.81);Other abnormalities of gait and mobility (R26.89);Muscle weakness (generalized) (M62.81);Difficulty in walking, not elsewhere classified (R26.2)     Time: 1350-1406 PT  Time Calculation (min) (ACUTE ONLY): 16 min  Charges:  $Gait Training: 8-22 mins                     {Analyssa Roney Youtz, PTA 07/15/21, 3:08 PM

## 2021-07-16 DIAGNOSIS — Z471 Aftercare following joint replacement surgery: Secondary | ICD-10-CM | POA: Diagnosis not present

## 2021-07-16 DIAGNOSIS — R2681 Unsteadiness on feet: Secondary | ICD-10-CM | POA: Diagnosis not present

## 2021-07-16 DIAGNOSIS — Z741 Need for assistance with personal care: Secondary | ICD-10-CM | POA: Diagnosis not present

## 2021-07-16 DIAGNOSIS — M1611 Unilateral primary osteoarthritis, right hip: Secondary | ICD-10-CM | POA: Diagnosis not present

## 2021-07-16 DIAGNOSIS — K5900 Constipation, unspecified: Secondary | ICD-10-CM | POA: Diagnosis not present

## 2021-07-16 DIAGNOSIS — I5022 Chronic systolic (congestive) heart failure: Secondary | ICD-10-CM | POA: Diagnosis not present

## 2021-07-16 DIAGNOSIS — E785 Hyperlipidemia, unspecified: Secondary | ICD-10-CM | POA: Diagnosis not present

## 2021-07-16 DIAGNOSIS — I2510 Atherosclerotic heart disease of native coronary artery without angina pectoris: Secondary | ICD-10-CM | POA: Diagnosis not present

## 2021-07-16 DIAGNOSIS — D649 Anemia, unspecified: Secondary | ICD-10-CM | POA: Diagnosis not present

## 2021-07-16 DIAGNOSIS — I4891 Unspecified atrial fibrillation: Secondary | ICD-10-CM | POA: Diagnosis not present

## 2021-07-16 DIAGNOSIS — Z96641 Presence of right artificial hip joint: Secondary | ICD-10-CM | POA: Diagnosis not present

## 2021-07-16 DIAGNOSIS — R69 Illness, unspecified: Secondary | ICD-10-CM | POA: Diagnosis not present

## 2021-07-16 DIAGNOSIS — J441 Chronic obstructive pulmonary disease with (acute) exacerbation: Secondary | ICD-10-CM | POA: Diagnosis not present

## 2021-07-16 DIAGNOSIS — I9581 Postprocedural hypotension: Secondary | ICD-10-CM | POA: Diagnosis not present

## 2021-07-16 DIAGNOSIS — R5381 Other malaise: Secondary | ICD-10-CM | POA: Diagnosis not present

## 2021-07-16 DIAGNOSIS — I5040 Unspecified combined systolic (congestive) and diastolic (congestive) heart failure: Secondary | ICD-10-CM | POA: Diagnosis not present

## 2021-07-16 DIAGNOSIS — R41841 Cognitive communication deficit: Secondary | ICD-10-CM | POA: Diagnosis not present

## 2021-07-16 DIAGNOSIS — I10 Essential (primary) hypertension: Secondary | ICD-10-CM | POA: Diagnosis not present

## 2021-07-16 DIAGNOSIS — M1990 Unspecified osteoarthritis, unspecified site: Secondary | ICD-10-CM | POA: Diagnosis not present

## 2021-07-16 DIAGNOSIS — M6281 Muscle weakness (generalized): Secondary | ICD-10-CM | POA: Diagnosis not present

## 2021-07-16 DIAGNOSIS — M0560 Rheumatoid arthritis of unspecified site with involvement of other organs and systems: Secondary | ICD-10-CM | POA: Diagnosis not present

## 2021-07-16 DIAGNOSIS — R279 Unspecified lack of coordination: Secondary | ICD-10-CM | POA: Diagnosis not present

## 2021-07-16 MED ORDER — BISACODYL 10 MG RE SUPP
10.0000 mg | Freq: Once | RECTAL | Status: DC
  Filled 2021-07-16: qty 1

## 2021-07-16 MED ORDER — MAGNESIUM HYDROXIDE 400 MG/5ML PO SUSP
30.0000 mL | Freq: Once | ORAL | Status: AC
  Administered 2021-07-16: 30 mL via ORAL
  Filled 2021-07-16: qty 30

## 2021-07-16 NOTE — TOC Progression Note (Signed)
Transition of Care [facility]) - Progression Note    Patient Details  Name: Teresa [name] MRN: [id number] Date of Birth: 10-27-1941  Transition of Care [facility]) CM/SW [place], RN Phone Number: 07/16/2021, 9:54 AM  Clinical Narrative:     [facility]ed [facility] to arrange Transport to [facility], The earliest available slot is 1230, I notified her daughter that the patient will be picked up at 1230, she stated understanding.  The nurse is to call Report to [facility]       Expected Discharge Plan and Services           Expected Discharge Date: 07/16/21                                     Social Determinants of Health (SDOH) Interventions    Readmission Risk Interventions No flowsheet data found.

## 2021-07-16 NOTE — Progress Notes (Signed)
Progress Note  Patient Name: Teresa [name] Date of Encounter: 07/16/2021  Primary Cardiologist: Rob Hickman  Subjective   She is doing well this morning.  No chest pain, dyspnea, or palpitations.  No further dizziness with positional changes or ambulation.  BP has improved to the [id number] to 0000000 systolic.  Planning for discharge today.  Inpatient Medications    Scheduled Meds:  apixaban  5 mg Oral BID   Apremilast  30 mg Oral Daily   bisacodyl  10 mg Rectal Once   docusate sodium  100 mg Oral BID   donepezil  5 mg Oral QHS   gabapentin  600 mg Oral BID   loratadine  10 mg Oral Daily   mometasone-formoterol  2 puff Inhalation BID   rosuvastatin  5 mg Oral Daily   traMADol  50 mg Oral Q6H   venlafaxine  75 mg Oral QHS   Continuous Infusions:  methocarbamol (ROBAXIN) IV     PRN Meds: acetaminophen, alum & mag hydroxide-simeth, bisacodyl, fluticasone, HYDROcodone-acetaminophen, HYDROcodone-acetaminophen, menthol-cetylpyridinium **OR** phenol, methocarbamol **OR** methocarbamol (ROBAXIN) IV, metoCLOPramide **OR** metoCLOPramide (REGLAN) injection, morphine injection, ondansetron **OR** ondansetron (ZOFRAN) IV, polyethylene glycol, zolpidem   Vital Signs    Vitals:   07/15/21 1639 07/15/21 2025 07/16/21 0541 07/16/21 0716  BP: (!) 134/56 (!) 138/53 140/65 (!) 140/59  Pulse: 67 73 71 69  Resp: '16 20 20 15  '$ Temp: 98 F (36.7 C) 98.5 F (36.9 C) 97.9 F (36.6 C) 98.3 F (36.8 C)  TempSrc:  Oral Oral   SpO2: 100% 98% 95% 96%  Weight:      Height:        Intake/Output Summary (Last 24 hours) at 07/16/2021 0953 Last data filed at 07/16/2021 0400 Gross per 24 hour  Intake 320 ml  Output 1100 ml  Net -780 ml    Filed Weights   07/11/21 0752  Weight: 79.8 kg    Telemetry    Not on telemetry - Personally Reviewed  ECG    No new tracings - Personally Reviewed  Physical Exam   GEN: No acute distress.   Neck: No JVD. Cardiac: RRR, no murmurs, rubs, or gallops.   Respiratory: Clear to auscultation bilaterally.  GI: Soft, nontender, non-distended.   MS: No edema; No deformity.  Improved tenderness to palpation of the anterior aspect of the left lower extremity.    Neuro:  Alert and oriented x 3; Nonfocal.  Psych: Normal affect.  Labs    Chemistry Recent Labs  Lab 07/12/21 0431  NA 139  K 4.7  CL 108  CO2 27  GLUCOSE 134*  BUN 17  CREATININE 0.81  CALCIUM 8.8*  GFRNONAA >60  ANIONGAP 4*      Hematology Recent Labs  Lab 07/12/21 0431 07/13/21 0550  WBC 6.8 6.6  RBC 4.00 3.21*  HGB 12.9 10.2*  HCT 38.2 31.8*  MCV 95.5 99.1  MCH 32.3 31.8  MCHC 33.8 32.1  RDW 13.2 13.6  PLT 225 173     Cardiac EnzymesNo results for input(s): TROPONINI in the last 168 hours. No results for input(s): TROPIPOC in the last 168 hours.   BNPNo results for input(s): BNP, PROBNP in the last 168 hours.   DDimer No results for input(s): DDIMER in the last 168 hours.   Radiology    No results found.  Cardiac Studies   Echo 05/2021 ([facility]): INTERPRETATION ---------------------------------------------------------------    MILD LV DYSFUNCTION (See above) WITH MILD LVH    ELEVATED LA PRESSURES  WITH DIASTOLIC DYSFUNCTION    NORMAL RIGHT VENTRICULAR SYSTOLIC FUNCTION    VALVULAR REGURGITATION: MILD MR, TRIVIAL PR, MILD TR    NO VALVULAR STENOSIS    R>L IAFC    RIGHT TO LEFT IAFC SEEN BY COLOR AND DOPPLER, MAX VELOCITY 1.14ms (161mg)    UNABLE TO OBTAIN IV ACCESS FOR USE OF DEFINITY    3D acquisition and reconstructions were performed as part of this    examination to more accurately quantify the effects of reduced left    ventricular ejection fraction. (post-processing on an Independent    workstation).     Compared with prior Echo study on 05/31/2021: ESTIMATED RVSP HAS INCREASED    RV HAS IMPROVED IN SIZE AND FUNCTION    NO SIGNIFICANT CHANGES IN LV FUNCTION  __________  Echo 08/2020 ([facility]): ECHOCARDIOGRAPHIC DESCRIPTIONS  -----------------------------------------------  AORTIC ROOT          Size: Normal    Dissection: INDETERM FOR DISSECTION   AORTIC VALVE      Leaflets: Tricuspid             Morphology: Normal      Mobility: Fully Mobile   LEFT VENTRICLE                                      Anterior: HYPOCONTRACTILE          Size: Normal                                 Lateral: HYPOCONTRACTILE   Contraction: MILD GLOBAL DECREASE                    Septal: HYPOCONTRACTILE    Closest EF: 45% (Estimated)  Calc.EF: 46% (3D)      Apical: HYPOCONTRACTILE     LV masses: No Masses                             Inferior: HYPOCONTRACTILE           LVH: MILD LVH                             Posterior: HYPOCONTRACTILE  Dias.FxClass: RELAXATION ABNORMALITY (GRADE 2) CORRESPONDS TO PSEUDONORMAL   MITRAL VALVE      Leaflets: Normal                  Mobility: Fully mobile    Morphology: Normal   LEFT ATRIUM          Size: SEVERELY ENLARGED     LA masses: No masses                Normal IAS   MAIN PA          Size: DILATED       PA Note:  PAAT = 81 msec   PULMONIC VALVE    Morphology: Normal      Mobility: Fully Mobile   RIGHT VENTRICLE          Size: MODERATELY ENLARGED       Free wall: HYPOCONTRACTILE   Contraction: MILD GLOBAL DECREASE      RV masses: No Masses       RV Note: SYSTOLIC ANNULAR VELOCITY = 10.6 cm/sec  TRICUSPID VALVE      Leaflets: Normal                  Mobility: Fully mobile    Morphology: Normal   RIGHT ATRIUM          Size: MILDLY ENLARGED            RA Other: None     RA masses: EUSTACHIAN VALVE   PERICARDIUM         Fluid: No effusion   INFERIOR VENACAVA          Size: Normal     Normal respiratory collapse   DOPPLER ECHO and OTHER SPECIAL PROCEDURES ------------------------------------     Aortic: No AR                  No AS      Mitral: MILD MR                No MS     MV Inflow E Vel.= 74.0 cm/s  MV Annulus E'Vel.= 7.5 cm/s  E/E'Ratio= 10   Tricuspid: MILD TR                 No TS             3.0 m/s peak TR vel   40 mmHg peak RV pressure   Pulmonary: No PR                  No PS       Other:             DEFINITY CONTRAST SHOWS ENHANCED LV BORDERS   INTERPRETATION ---------------------------------------------------------------    MILD LV DYSFUNCTION (See above) WITH MILD LVH    ELEVATED LA PRESSURES WITH DIASTOLIC DYSFUNCTION    MILD RV SYSTOLIC DYSFUNCTION (See above)    VALVULAR REGURGITATION: MILD MR, MILD TR    NO VALVULAR STENOSIS    MODERATE IAFC    FREQUENT ECTOPY THROUGHOUT EXAM.    RIGHT TO LEFT IAFC NOTED BY COLOR AND DOPPLER, MAX VELOCITY 1.8 m/sec (13    mmHg).    PROMINENT EUSTACHIAN VALVE IN RA.    3D acquisition and reconstructions were performed as part of this    examination to more accurately quantify the effects of reduced left    ventricular ejection fraction.     Compared with prior Echo study on 08/12/2019: LV FUNCTION SLIGHTLY BETTER  Patient Profile     80 y.o. female with history of CAD with MI in 1994 s/p 1-vessel CABG with LIMA to LAD, HFrEF secondary to ICM, and Afib s/p PVI in 05/2019 on Eliquis who we are seeing for postoperative hypotension.   Assessment & Plan    1. Post operative hypotension: -BP improved -Continue to hold Entresto and Toprol-XL -Resumption of GDMT per primary cardiology group -Maintain adequate oral intake/hydration  -Working with PT  2. Chronic HFrEF/ICM: -She appears euvolemic and well compensated s/p one dose of IV Lasix on 7/23 -GDMT on hold as above, resume as able per primary cardiologist -Would not place on standing diuretic   3. Afib: -Status post PVI in 2020 -Toprol held as above -Eliquis  -CHADS2VASc 5 (CHF, age x 2, vascular disease, sex category)  4. CAD s/p remote 1-vessel CABG: -No symptoms of angina -On Eliquis in place of ASA to minimize bleeding risk  5. HLD: -PTA Crestor  6. Anemia: -Likely in the postoperative setting -No obvious bleeding   For  questions  or updates, please contact [place] Please consult [email or web address] for contact info under Cardiology/STEMI.    Signed, Christell Faith, PA-C [place] Pager: [phone] 07/16/2021, 9:53 AM

## 2021-07-16 NOTE — TOC Progression Note (Signed)
Transition of Care [facility]) - Progression Note    Patient Details  Name: ADAJAH MAIETTA MRN: [id number] Date of Birth: 1941/09/22  Transition of [facility]) CM/SW [place], RN Phone Number: 07/16/2021, 8:35 AM  Clinical Narrative:     Josem Kaufmann approved will DC today to [facility]       Expected Discharge Plan and Services           Expected Discharge Date: 07/15/21                                     Social Determinants of Health (SDOH) Interventions    Readmission Risk Interventions No flowsheet data found.

## 2021-07-16 NOTE — Progress Notes (Signed)
Patient discharged to [facility], report called to [facility], LPN. Non-emergency transport provided transportation to facility. All belongings sent with the patient, IV removed, no tele

## 2021-07-16 NOTE — Progress Notes (Signed)
Subjective: 5 Days Post-Op Procedure(s) (LRB): TOTAL HIP ARTHROPLASTY ANTERIOR APPROACH (Right) Patient reports pain as mild.   Pain well controlled.  No complaints of chest pain, shortness of breath, abdominal pain, nausea or vomiting.  Overall right hip pain/swelling improving.  Cardiology following, blood pressures soft yesterday.  Recommended that she discontinue the metoprolol. Plan is to go Skilled nursing facility after hospital stay.  Objective: Vital signs in last 24 hours: Temp:  [97.9 F (36.6 C)-98.5 F (36.9 C)] 97.9 F (36.6 C) (07/26 0541) Pulse Rate:  [67-73] 71 (07/26 0541) Resp:  [15-20] 20 (07/26 0541) BP: (81-140)/(44-65) 140/65 (07/26 0541) SpO2:  [95 %-100 %] 95 % (07/26 0541)  Intake/Output from previous day: 07/25 0701 - 07/26 0700 In: 320 [P.O.:320] Out: 1100 [Urine:1100] Intake/Output this shift: No intake/output data recorded.  No results for input(s): HGB in the last 72 hours.  No results for input(s): WBC, RBC, HCT, PLT in the last 72 hours.  No results for input(s): NA, K, CL, CO2, BUN, CREATININE, GLUCOSE, CALCIUM in the last 72 hours.  No results for input(s): LABPT, INR in the last 72 hours.  EXAM General - Patient is Alert, Appropriate, and Oriented Extremity - Neurovascular intact Sensation intact distally Intact pulses distally Dorsiflexion/Plantar flexion intact Dressing - dressing C/D/I and no drainage, Praveena intact without drainage Motor Function - intact, moving foot and toes well on exam.   Past Medical History:  Diagnosis Date   Arthritis    Asthma    Back pain    Breast cancer, left ([place]) 2009   CAD (coronary artery disease)    a. 1994 s/p CABG x 1 (LIMA->LAD); b. 03/2015 MV: No ischemia; c. MV 11/18: small fixed apical defect likely secondary to breast attenuation, EF of 42%, frequent PVCs; d. 12/2017 Cath: LM nl, LAD 20p, D1/2/3 nl, LCX min irregs, OM1/2/3 min irregs, RCA nl, RPDA nl, RPL1/2 nl, LIMA->LAD atretic.    Chronic anticoagulation    Apixaban   Chronic combined systolic (congestive) and diastolic (congestive) heart failure ([place])    a. 2013 EF 40%;  b. 03/2015 Echo: EF 55-60%; c. 12/18 Echo: EF of 35-40%; d. 12/2017 TEE: EF 35-40%; e. 03/2018 Echo: EF 40-45%; f. 09/2018 Echo: EF 35%.   DDD (degenerative disc disease), lumbar    Dental crowns present    caps- left back top, right back bottom   Gastroesophageal reflux disease    Hyperlipidemia    Hypertension    LBBB (left bundle branch block)    MI (myocardial infarction) ([place]) 1994   Mixed Ischemic & Nonischemic cardiomyopathy    a. 2013 EF 40%;  b. 03/2015 Echo: EF 55-60%; c. 12/18 Echo: EF of 35-40%, hypokinesis of the anteroseptal, and apical myocardium, mild to mod MR, mod dil LA, nl RV fxn, PASP 53 mmHg; d. 12/2017 TEE: EF 35-40%, diff HK, mild to mod MR. small PFO. No LAA/RAA thrombus; e. 03/2018 Echo: EF 40-45%, antsept/inf HK, Gr2 DD, mild MR, mod idl LA, mild to mod TR, PASP 35-33mHg; f. 09/2018 Echo: EF 35%.   Osteoarthritis    left hip and knee   Persistent atrial fibrillation ([place]    a. diagnosed 01/13/2018; b. CHADS2VASc = 6 --> Eliquis; c. 12/2017 s/p TEE/DCCV. Amio started but d/c'd 01/2018 2/2 n/anorexia; d. 10/2018 DCCV-->recurrent Afib w/in days; e. 10/2018 DCCV x 2 in ED->persistent Afib.   Personal history of radiation therapy    PONV (postoperative nausea and vomiting)    Psoriasis    PSVT (paroxysmal  supraventricular tachycardia) ([place])    a. 02/2015 Holter: short runs of SVT and NSVT.   Pulmonary hypertension (HCC)    PVC's (premature ventricular contractions)    a. 03/2018 24h Holter: Freq PVC's with a total of 421 beats in 24 hrs. 7 short runs of SVT likely representing Afib.   Rheumatoid arthritis (HCC)    feet, hands   Vertigo    approx 2x/yr    Assessment/Plan:   5 Days Post-Op Procedure(s) (LRB): TOTAL HIP ARTHROPLASTY ANTERIOR APPROACH (Right) Active Problems:   Status post total hip replacement, right  Estimated  body mass index is 30.21 kg/m as calculated from the following:   Height as of this encounter: '5\' 4"'$  (1.626 m).   Weight as of this encounter: 79.8 kg. Advance diet Up with therapy Work on bowel movement, suppository ordered Vital signs are stable.  Blood pressure improved.  DC metoprolol/Entresto Pain well controlled.  Care management to assist with discharge to skilled nursing facility.  Patient stable and ready for discharge to SNF today.  DVT Prophylaxis - TED hose and Eliquis, SCDs Weight-Bearing as tolerated to right leg   T. Rachelle Hora, PA-C [place] 07/16/2021, 7:06 AM

## 2021-07-17 DIAGNOSIS — I4891 Unspecified atrial fibrillation: Secondary | ICD-10-CM | POA: Diagnosis not present

## 2021-07-17 DIAGNOSIS — R69 Illness, unspecified: Secondary | ICD-10-CM | POA: Diagnosis not present

## 2021-07-17 DIAGNOSIS — J441 Chronic obstructive pulmonary disease with (acute) exacerbation: Secondary | ICD-10-CM | POA: Diagnosis not present

## 2021-07-17 DIAGNOSIS — E785 Hyperlipidemia, unspecified: Secondary | ICD-10-CM | POA: Diagnosis not present

## 2021-07-17 DIAGNOSIS — I10 Essential (primary) hypertension: Secondary | ICD-10-CM | POA: Diagnosis not present

## 2021-07-17 DIAGNOSIS — I5040 Unspecified combined systolic (congestive) and diastolic (congestive) heart failure: Secondary | ICD-10-CM | POA: Diagnosis not present

## 2021-07-17 DIAGNOSIS — I2510 Atherosclerotic heart disease of native coronary artery without angina pectoris: Secondary | ICD-10-CM | POA: Diagnosis not present

## 2021-07-17 DIAGNOSIS — M1611 Unilateral primary osteoarthritis, right hip: Secondary | ICD-10-CM | POA: Diagnosis not present

## 2021-07-17 DIAGNOSIS — M0560 Rheumatoid arthritis of unspecified site with involvement of other organs and systems: Secondary | ICD-10-CM | POA: Diagnosis not present

## 2021-07-22 DIAGNOSIS — M1611 Unilateral primary osteoarthritis, right hip: Secondary | ICD-10-CM | POA: Diagnosis not present

## 2021-07-22 DIAGNOSIS — I5040 Unspecified combined systolic (congestive) and diastolic (congestive) heart failure: Secondary | ICD-10-CM | POA: Diagnosis not present

## 2021-07-22 DIAGNOSIS — J441 Chronic obstructive pulmonary disease with (acute) exacerbation: Secondary | ICD-10-CM | POA: Diagnosis not present

## 2021-07-22 DIAGNOSIS — R69 Illness, unspecified: Secondary | ICD-10-CM | POA: Diagnosis not present

## 2021-07-22 DIAGNOSIS — I4891 Unspecified atrial fibrillation: Secondary | ICD-10-CM | POA: Diagnosis not present

## 2021-07-22 DIAGNOSIS — K5900 Constipation, unspecified: Secondary | ICD-10-CM | POA: Diagnosis not present

## 2021-07-22 DIAGNOSIS — I10 Essential (primary) hypertension: Secondary | ICD-10-CM | POA: Diagnosis not present

## 2021-07-23 ENCOUNTER — Telehealth: Payer: Self-pay | Admitting: *Deleted

## 2021-07-23 NOTE — Chronic Care Management (AMB) (Signed)
  Care Management   Note  07/23/2021 Name: Teresa [name] MRN: [id number] DOB: 1941-11-16  CLO ALDI is a 80 y.o. year old female who is a primary care patient of Jerrol Banana., MD and is actively engaged with the care management team. I reached out to Otho Bellows by phone today to assist with re-scheduling a follow up visit with the Pharmacist  Follow up plan: A telephone outreach attempt made. The care management team will reach out to the patient again over the next 7 days. If patient returns call to provider office, please advise to call [place] at [phone].  [place] Management  Direct Dial: [phone]

## 2021-07-25 DIAGNOSIS — I4891 Unspecified atrial fibrillation: Secondary | ICD-10-CM | POA: Diagnosis not present

## 2021-07-25 DIAGNOSIS — M1611 Unilateral primary osteoarthritis, right hip: Secondary | ICD-10-CM | POA: Diagnosis not present

## 2021-07-25 DIAGNOSIS — J441 Chronic obstructive pulmonary disease with (acute) exacerbation: Secondary | ICD-10-CM | POA: Diagnosis not present

## 2021-07-25 NOTE — Chronic Care Management (AMB) (Signed)
  Care Management   Note  07/25/2021 Name: Teresa [name] MRN: [id number] DOB: 08-Mar-1941  [name] is a 80 y.o. year old female who is a primary care patient of Jerrol Banana., MD and is actively engaged with the care management team. I reached out to Otho Bellows by phone today to assist with re-scheduling an initial visit with the Pharmacist  Follow up plan: Telephone appointment with care management team member scheduled for: 07/31/21  [place] Management  Direct Dial: [phone]

## 2021-07-26 DIAGNOSIS — J441 Chronic obstructive pulmonary disease with (acute) exacerbation: Secondary | ICD-10-CM | POA: Diagnosis not present

## 2021-07-26 DIAGNOSIS — M1611 Unilateral primary osteoarthritis, right hip: Secondary | ICD-10-CM | POA: Diagnosis not present

## 2021-07-26 DIAGNOSIS — I10 Essential (primary) hypertension: Secondary | ICD-10-CM | POA: Diagnosis not present

## 2021-07-26 DIAGNOSIS — I4891 Unspecified atrial fibrillation: Secondary | ICD-10-CM | POA: Diagnosis not present

## 2021-07-26 DIAGNOSIS — M0560 Rheumatoid arthritis of unspecified site with involvement of other organs and systems: Secondary | ICD-10-CM | POA: Diagnosis not present

## 2021-07-26 DIAGNOSIS — R69 Illness, unspecified: Secondary | ICD-10-CM | POA: Diagnosis not present

## 2021-07-26 DIAGNOSIS — I2510 Atherosclerotic heart disease of native coronary artery without angina pectoris: Secondary | ICD-10-CM | POA: Diagnosis not present

## 2021-07-26 DIAGNOSIS — I5040 Unspecified combined systolic (congestive) and diastolic (congestive) heart failure: Secondary | ICD-10-CM | POA: Diagnosis not present

## 2021-07-30 DIAGNOSIS — Z471 Aftercare following joint replacement surgery: Secondary | ICD-10-CM | POA: Diagnosis not present

## 2021-07-30 DIAGNOSIS — I2510 Atherosclerotic heart disease of native coronary artery without angina pectoris: Secondary | ICD-10-CM | POA: Diagnosis not present

## 2021-07-30 DIAGNOSIS — I10 Essential (primary) hypertension: Secondary | ICD-10-CM | POA: Diagnosis not present

## 2021-07-30 DIAGNOSIS — M5136 Other intervertebral disc degeneration, lumbar region: Secondary | ICD-10-CM | POA: Diagnosis not present

## 2021-07-30 DIAGNOSIS — Z96641 Presence of right artificial hip joint: Secondary | ICD-10-CM | POA: Diagnosis not present

## 2021-07-30 DIAGNOSIS — M069 Rheumatoid arthritis, unspecified: Secondary | ICD-10-CM | POA: Diagnosis not present

## 2021-07-30 DIAGNOSIS — J441 Chronic obstructive pulmonary disease with (acute) exacerbation: Secondary | ICD-10-CM | POA: Diagnosis not present

## 2021-07-30 DIAGNOSIS — E785 Hyperlipidemia, unspecified: Secondary | ICD-10-CM | POA: Diagnosis not present

## 2021-07-30 DIAGNOSIS — K219 Gastro-esophageal reflux disease without esophagitis: Secondary | ICD-10-CM | POA: Diagnosis not present

## 2021-07-30 DIAGNOSIS — I4891 Unspecified atrial fibrillation: Secondary | ICD-10-CM | POA: Diagnosis not present

## 2021-07-31 ENCOUNTER — Telehealth: Payer: Self-pay | Admitting: Family Medicine

## 2021-07-31 ENCOUNTER — Telehealth: Payer: Self-pay

## 2021-07-31 DIAGNOSIS — K219 Gastro-esophageal reflux disease without esophagitis: Secondary | ICD-10-CM | POA: Diagnosis not present

## 2021-07-31 DIAGNOSIS — I4891 Unspecified atrial fibrillation: Secondary | ICD-10-CM | POA: Diagnosis not present

## 2021-07-31 DIAGNOSIS — I10 Essential (primary) hypertension: Secondary | ICD-10-CM | POA: Diagnosis not present

## 2021-07-31 DIAGNOSIS — J441 Chronic obstructive pulmonary disease with (acute) exacerbation: Secondary | ICD-10-CM | POA: Diagnosis not present

## 2021-07-31 DIAGNOSIS — E785 Hyperlipidemia, unspecified: Secondary | ICD-10-CM | POA: Diagnosis not present

## 2021-07-31 DIAGNOSIS — M5136 Other intervertebral disc degeneration, lumbar region: Secondary | ICD-10-CM | POA: Diagnosis not present

## 2021-07-31 DIAGNOSIS — Z96641 Presence of right artificial hip joint: Secondary | ICD-10-CM | POA: Diagnosis not present

## 2021-07-31 DIAGNOSIS — Z471 Aftercare following joint replacement surgery: Secondary | ICD-10-CM | POA: Diagnosis not present

## 2021-07-31 DIAGNOSIS — M069 Rheumatoid arthritis, unspecified: Secondary | ICD-10-CM | POA: Diagnosis not present

## 2021-07-31 DIAGNOSIS — I2510 Atherosclerotic heart disease of native coronary artery without angina pectoris: Secondary | ICD-10-CM | POA: Diagnosis not present

## 2021-07-31 NOTE — Telephone Encounter (Signed)
Home Health Verbal Orders - Caller/Agency: Glenna Durand PT  Callback Number: [id number] Requesting OT/PT/Skilled Nursing/Social Work/Speech Therapy: PT  Frequency:  2w3 1w4

## 2021-07-31 NOTE — Progress Notes (Deleted)
Chronic Care Management Pharmacy Note  07/31/2021 Name:  Teresa [name] MRN:  [id number] DOB:  August 03, 1941  Summary: Today's visit was conducted entirely with patient's daughter, Latronda Spink. She reports her mother has been doing well, and is nervous regarding an upcoming surgery in 3-4 weeks. They had a bad experience at their neurologist, and were interested in a potential new neurology referral. She was also interested in trying to reduce her pill burden when possible.  Recommendations/Changes made from today's visit: Continue current medications  Plan: CPP follow-up in 2 months   Subjective: Teresa [name] is an 80 y.o. year old female who is a primary patient of Jerrol Banana., MD.  The CCM team was consulted for assistance with disease management and care coordination needs.    Engaged with patient by telephone for follow up visit in response to provider referral for pharmacy case management and/or care coordination services.   Consent to Services:  The patient was given information about Chronic Care Management services, agreed to services, and gave verbal consent prior to initiation of services.  Please see initial visit note for detailed documentation.   Patient Care Team: Jerrol Banana., MD as PCP - General (Family Medicine) Wellington Hampshire, MD as PCP - Cardiology (Cardiology) Dasher, Rayvon Char, MD as Consulting Physician (Dermatology) Blanche East, MD as Consulting Physician (Neurosurgery) Estevan Ryder, MD as Referring Physician (Cardiology) Troxler, Adele Schilder as Attending Physician (Podiatry) Germaine Pomfret, [facility] as Pharmacist (Pharmacist) Quintin Alto, MD as Consulting Physician (Rheumatology) Diamond Nickel, DO as Consulting Physician (Sports Medicine) Marchia Meiers, MD as Consulting Physician (Ophthalmology)  Recent office visits: 11/26/2020   Recent consult visits: 05/31/21: Patient presented to Dr. Ron Parker (Cardiology) for  follow-up. No medication chagnes made.  04/09/21: Patient presented to Dr. Posey Pronto (Rheumatology) for follow-up.  Hospital visits: None in previous 6 months   Objective:  Lab Results  Component Value Date   CREATININE 0.81 07/12/2021   BUN 17 07/12/2021   GFRNONAA >60 07/12/2021   GFRAA 69 02/16/2019   NA 139 07/12/2021   K 4.7 07/12/2021   CALCIUM 8.8 (L) 07/12/2021   CO2 27 07/12/2021   GLUCOSE 134 (H) 07/12/2021    Lab Results  Component Value Date/Time   HGBA1C 5.8 (A) 11/26/2020 01:33 PM   HGBA1C 5.8 (H) 05/30/2020 03:18 PM    Last diabetic Eye exam: No results found for: HMDIABEYEEXA  Last diabetic Foot exam: No results found for: HMDIABFOOTEX   Lab Results  Component Value Date   CHOL 132 02/16/2019   HDL 57 02/16/2019   LDLCALC 62 02/16/2019   TRIG 65 02/16/2019   CHOLHDL 2.3 02/16/2019    Hepatic Function Latest Ref Rng & Units 07/02/2021 02/16/2019 10/21/2017  Total Protein 6.5 - 8.1 g/dL 6.7 6.7 7.0  Albumin 3.5 - 5.0 g/dL 3.8 4.3 -  AST 15 - 41 U/L _0 ALT 0 - 44 U/L _1 Alk Phosphatase 38 - 126 U/L 64 103 -  Total Bilirubin 0.3 - 1.2 mg/dL 0.7 0.6 0.6  Bilirubin, Direct 0.1 - 0.5 mg/dL - - -    Lab Results  Component Value Date/Time   TSH 2.130 05/30/2020 03:18 PM   TSH 1.340 02/16/2019 03:30 PM   FREET4 0.93 01/13/2018 11:40 AM    CBC Latest Ref Rng & Units 07/13/2021 07/12/2021 07/02/2021  WBC 4.0 - 10.5 K/uL 6.6 6.8 5.1  Hemoglobin 12.0 - 15.0 g/dL 10.2(L) 12.9  13.4  Hematocrit 36.0 - 46.0 % 31.8(L) 38.2 40.7  Platelets 150 - 400 K/uL 173 225 202    No results found for: VD25OH  Clinical ASCVD: Yes  The ASCVD Risk score Mikey Bussing DC Jr., et al., 2013) failed to calculate for the following reasons:   The patient has a prior MI or stroke diagnosis    Depression screen [facility] 2/9 01/10/2021 01/10/2020 01/10/2020  Decreased Interest 0 0 0  Down, Depressed, Hopeless 0 0 0  PHQ - 2 Score 0 0 0  Altered sleeping - - -  Tired, decreased  energy - - -  Change in appetite - - -  Feeling bad or failure about yourself  - - -  Trouble concentrating - - -  Moving slowly or fidgety/restless - - -  Suicidal thoughts - - -  PHQ-9 Score - - -  Difficult doing work/chores - - -  Some recent data might be hidden    Social History   Tobacco Use  Smoking Status Former   Packs/day: 0.50   Years: 35.00   Pack years: 17.50   Types: Cigarettes   Quit date: 1991   Years since quitting: 31.6  Smokeless Tobacco Never  Tobacco Comments   Quit approx 1990   BP Readings from Last 3 Encounters:  07/16/21 (!) 119/37  07/02/21 (!) 119/51  02/22/21 112/64   Pulse Readings from Last 3 Encounters:  07/16/21 66  07/02/21 60  02/22/21 (S) (!) 50   Wt Readings from Last 3 Encounters:  07/11/21 176 lb (79.8 kg)  07/02/21 175 lb (79.4 kg)  02/22/21 171 lb 1.2 oz (77.6 kg)   BMI Readings from Last 3 Encounters:  07/11/21 30.21 kg/m  07/02/21 30.04 kg/m  02/22/21 29.37 kg/m    Assessment/Interventions: Review of patient past medical history, allergies, medications, health status, including review of consultants reports, laboratory and other test data, was performed as part of comprehensive evaluation and provision of chronic care management services.   SDOH:  (Social Determinants of Health) assessments and interventions performed: Yes  SDOH Screenings   Alcohol Screen: Low Risk    Last Alcohol Screening Score (AUDIT): 0  Depression (PHQ2-9): Low Risk    PHQ-2 Score: 0  Financial Resource Strain: Low Risk    Difficulty of Paying Living Expenses: Not hard at all  Food Insecurity: No Food Insecurity   Worried About Charity fundraiser in the Last Year: Never true   Ran Out of Food in the Last Year: Never true  Housing: Low Risk    Last Housing Risk Score: 0  Physical Activity: Inactive   Days of Exercise per Week: 0 days   Minutes of Exercise per Session: 0 min  Social Connections: Moderately Isolated   Frequency of  Communication with Friends and Family: More than three times a week   Frequency of Social Gatherings with Friends and Family: More than three times a week   Attends Religious Services: More than 4 times per year   Active Member of [employer] or Organizations: No   Attends Archivist Meetings: Never   Marital Status: Divorced  Stress: No Stress Concern Present   Feeling of Stress : Not at all  Tobacco Use: Medium Risk   Smoking Tobacco Use: Former   Smokeless Tobacco Use: Never  Transportation Needs: No Data processing manager (Medical): No   Lack of Transportation (Non-Medical): No    CCM Care Plan  Allergies  Allergen Reactions  Sulfa Antibiotics Hives   Ivp Dye [Iodinated Diagnostic Agents] Swelling   Amiodarone Nausea And Vomiting and Cough   Codeine Other (See Comments)    ALTERED MENTAL STATUS    Medications Reviewed Today     Reviewed by Martha Clan, MD (Physician) on 07/11/21 at [phone]  Med List Status: Complete   Medication Order Taking? Sig Documenting Provider Last Dose Status Informant  acetaminophen (TYLENOL) 650 MG CR tablet [id number] Yes Take 650 mg by mouth every 8 (eight) hours as needed for pain. [provider] Past Week Active Self  apixaban (ELIQUIS) 5 MG TABS tablet [id number] Yes Take 1 tablet (5 mg total) by mouth 2 (two) times daily. Wellington Hampshire, MD 07/07/2021 Active Self  budesonide-formoterol (SYMBICORT) 160-4.5 MCG/ACT inhaler [id number] Yes Inhale 2 puffs into the lungs 2 (two) times daily.  Patient taking differently: Inhale 2 puffs into the lungs 2 (two) times daily as needed (shortness of breath or wheezing).   Jerrol Banana., MD 07/09/2021 Active            Med Note Kellie Simmering, BROOKE C   Fri Apr 12, 2021 10:26 AM)    cetirizine (ZYRTEC) 10 MG tablet [id number] Yes Take 1 tablet (10 mg total) by mouth daily.  Patient taking differently: Take 10 mg by mouth daily as needed for allergies.   Jerrol Banana., MD Past Week Active Family Member           Med Note Kellie Simmering, Jenene Slicker   Fri Apr 12, 2021 10:26 AM)    donepezil (ARICEPT) 5 MG tablet [id number] Yes TAKE 1 TABLET BY MOUTH EVERYDAY AT BEDTIME  Patient taking differently: Take 5 mg by mouth at bedtime.   Jerrol Banana., MD 07/10/2021 Active   ENTRESTO 24-26 MG [id number] Yes Take 1 tablet by mouth 2 (two) times daily. [provider] 07/10/2021 Active Self  fluticasone (FLONASE) 50 MCG/ACT nasal spray [id number] Yes Place 2 sprays into both nostrils daily as needed for allergies. Jerrol Banana., MD Past Month Active Self  furosemide (LASIX) 20 MG tablet [id number] No TAKE 1 TABLET (20 MG TOTAL) BY MOUTH DAILY AS NEEDED (SHORTNESS OF BREATH).  Patient not taking: Reported on 07/11/2021   Minna Merritts, MD Not Taking Active Self           Med Note Ivor Reining Jun 27, 2021  1:42 PM)    gabapentin (NEURONTIN) 300 MG capsule [id number] Yes Take two tablets by mouth in the AM, two tablets by mouth at bedtime, and 1 tablet at dinner as needed.  Patient taking differently: Take 600 mg by mouth 2 (two) times daily.   Jerrol Banana., MD 07/10/2021 Active   metoprolol succinate (TOPROL-XL) 50 MG 24 hr tablet [id number] Yes TAKE 1 TABLET (50 MG TOTAL) BY MOUTH 2 (TWO) TIMES DAILY  Patient taking differently: Take 25 mg by mouth in the morning and at bedtime.   Jerrol Banana., MD 07/11/2021 0500 Active   Multiple Vitamins-Minerals (MULTIVITAMIN PO) [id number] Yes Take 1 tablet by mouth daily. [provider] Past Week Active Self  OTEZLA 30 MG TABS [id number] Yes Take 30 mg by mouth in the morning and at bedtime. [provider] 07/10/2021 Active Self  rosuvastatin (CRESTOR) 5 MG tablet [id number] Yes TAKE 1 TABLET BY MOUTH EVERY DAY  Patient taking differently: Take 5 mg by mouth daily.   Jerrol Banana., MD 07/11/2021 0500 Active  traMADol (ULTRAM) 50 MG tablet [id number]  Yes Take 50 mg by mouth every 6 (six) hours as needed for moderate pain. [provider] 07/10/2021 Active Self  venlafaxine (EFFEXOR) 75 MG tablet [id number] Yes TAKE 2 TABLETS (150 MG TOTAL) BY MOUTH DAILY.  Patient taking differently: Take 75 mg by mouth at bedtime.   Jerrol Banana., MD 07/10/2021 Active             Patient Active Problem List   Diagnosis Date Noted   Status post total hip replacement, right 07/11/2021   Need for prophylactic vaccination against rabies 10/11/2020   Puncture wound 10/11/2020   Need for Tdap vaccination 10/11/2020   Dog bite of right lower leg with infection 10/11/2020   Neuropathy 05/02/2020   Chronic obstructive pulmonary disease ([place]) 03/11/2019   Atrial fibrillation ([place]) 11/11/2018   A-fib ([place]) 10/12/2018   Rheumatoid arthritis ([place]) 10/10/2018   Atrial fibrillation with RVR ([place]) 10/10/2018   Psoriatic arthritis ([place]) 03/04/2018   Unstable angina (H[facility])    Atrial fibrillation with rapid ventricular response ([place]) 01/13/2018   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   Asthma 05/09/2017   Primary osteoarthritis of both first carpometacarpal joints 01/31/2016   Arthralgia of both hands 01/31/2016   Ventricular premature depolarization 09/11/2015   Psoriasis 07/16/2015   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   Hyperlipidemia 03/29/2015   Pre-syncope 03/04/2015   CAD (coronary artery disease)    PVC's (premature ventricular contractions)    Ischemic cardiomyopathy    DDD (degenerative disc disease), lumbar 01/18/2015   Intervertebral disc disorder with radiculopathy of lumbar region 12/26/2014   L-S radiculopathy 07/03/2014   Breast CA ([place]) 06/28/2014   Arthritis, degenerative 06/28/2014   Acid reflux 03/08/2014   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    Immunization History  Administered Date(s) Administered   Fluad Quad(high Dose 65+) 09/12/2020   Influenza Whole 08/22/2018   Influenza, High  Dose Seasonal PF 10/04/2015, 10/06/2016, 09/16/2017, 08/31/2018, 10/07/2019   Influenza-Unspecified 07/22/2014   Moderna Sars-Covid-2 Vaccination 02/02/2020, 03/01/2020, 11/09/2020   PFIZER(Purple Top)SARS-COV-2 Vaccination 05/31/2021   Pneumococcal Conjugate-13 10/06/2016   Pneumococcal Polysaccharide-23 11/13/2009   Tdap 10/11/2020    Conditions to be addressed/monitored:  Hypertension, Atrial Fibrillation, Heart Failure, Coronary Artery Disease, GERD, COPD, Asthma, Osteoarthritis, and Psoriatic Arthritis and Insomnia  There are no care plans that you recently modified to display for this patient.    Medication Assistance: None required.  Patient affirms current coverage meets needs.  Compliance/Adherence/Medication fill history: Care Gaps: Shingrix  Star-Rating Drugs: Rosuvastatin 5 mg last filled on 05/19/2021 [facility] day supply [facility].  Patient's preferred [facility]  [id number] [address]: [phone]: [phone] [id number] [place] [address]: [phone]: [id number]one: [phone]: [phone] pill box? Yes Pt endorses 100% compliance  We discussed: Current pharmacy is preferred with insurance plan and patient is satisfied with pharmacy services Patient decided to: Continue current medication management strategy  Care Plan and Follow Up Patient Decision:  Patient agrees to Care Plan and Follow-up.  Plan: Telephone follow up appointment with care management team member scheduled for:  07/31/2021 at 12:30 PM   AleJunius ArgyleharmD, [phone]  Patient Care Plan: General Pharmacy (Adult)     Problem Identified: Hyperlipidemia, Atrial  Fibrillation, Heart Failure, Coronary Artery Disease, GERD, COPD, Asthma, Osteoarthritis and Psoriatic Arthritis and Insomnia   Priority: High     Goal: Patient-Specific Goal   Start Date: 02/11/2021  Expected End Date: 08/11/2021  Recent Progress: On track  Priority: High  Note:       Current Barriers:  No barriers noted  Pharmacist Clinical Goal(s):  Patient will maintain control of heart failure as evidenced by stable weight; no heart failure exacerbations  through collaboration with PharmD and provider.   Interventions: 1:1 collaboration with Jerrol Banana., MD regarding development and update of comprehensive plan of care as evidenced by provider attestation and co-signature Inter-disciplinary care team collaboration (see longitudinal plan of care) Comprehensive medication review performed; medication list updated in electronic medical record  Atrial Fibrillation (Goal: prevent stroke and major bleeding) -Controlled -Current treatment: Rate control: Metoprolol XL 50 mg 1/2 tablet twice daily  Anticoagulation: Eliquis 5 mg twice daily  -Medications previously tried: NA -Counseled on avoidance of NSAIDs due to increased bleeding risk with anticoagulants; -Recommended to continue current medication  Heart Failure (Goal: manage symptoms and prevent exacerbations) -Controlled -Last ejection fraction: 08% -HF type: Systolic -NYHA Class: II (slight limitation of activity) -AHA HF Stage: C (Heart disease and symptoms present) -Current treatment: Furosemide 20 mg daily as needed  Metoprolol XL 50 mg 1/2 tablet twice daily  Entresto 24-26 mg twice daily  -Medications previously tried: NA  -Educated on Importance of weighing daily; if you gain more than 3 pounds in one day or 5 pounds in one week, contact provider  -Recommended to continue current medication  Hyperlipidemia: (LDL goal < 70) -{US controlled/uncontrolled:25276} -Current treatment: Rosuvastatin 5 mg  daily  -Medications previously tried: ***  -Current dietary patterns: *** -Current exercise habits: *** -Educated on {CCM HLD Counseling:25126} -{CCMPHARMDINTERVENTION:25122}   Asthma (Goal: control symptoms and prevent exacerbations) -Controlled -Current treatment  Symbicort 2 puffs twice daily  Montelukast 10 mg daily as needed  -Current allergy treatment  Cetirizine 10 mg daily as needed  Flonase 50 mcg/act 2 sprays daily as needed  -Medications previously tried: NA  -Asthma allergy driven, seaosonal symptoms improved -Exacerbations requiring treatment in last 6 months: None -Patient reports consistent use of maintenance inhaler -Frequency of rescue inhaler use: Sporadic -Counseled on Benefits of consistent maintenance inhaler use -Recommended to continue current medication  *** (Goal: ***) -{US controlled/uncontrolled:25276} -Current treatment  Donepezil 5 mg nightly -Medications previously tried: ***  -{CCMPHARMDINTERVENTION:25122}  Chronic Pain (Goal: ***) -{US controlled/uncontrolled:25276} -Current treatment  APAP Gabapentin  Hydrocodone-APAP Methocarbamol  Otezla 30 mg twice daily  Tramadol -Medications previously tried: ***  -{CCMPHARMDINTERVENTION:25122}   Patient Goals/Self-Care Activities Patient will:  - check blood pressure 2-3 times weekly, document, and provide at future appointments weigh daily, and contact provider if weight gain of greater than 2 pounds  Follow Up Plan: ***

## 2021-08-01 NOTE — Telephone Encounter (Signed)
Please advise verbal orders? 

## 2021-08-02 NOTE — Telephone Encounter (Signed)
Verbal okay given.  

## 2021-08-06 DIAGNOSIS — Z471 Aftercare following joint replacement surgery: Secondary | ICD-10-CM | POA: Diagnosis not present

## 2021-08-06 DIAGNOSIS — I4891 Unspecified atrial fibrillation: Secondary | ICD-10-CM | POA: Diagnosis not present

## 2021-08-06 DIAGNOSIS — J441 Chronic obstructive pulmonary disease with (acute) exacerbation: Secondary | ICD-10-CM | POA: Diagnosis not present

## 2021-08-06 DIAGNOSIS — K219 Gastro-esophageal reflux disease without esophagitis: Secondary | ICD-10-CM | POA: Diagnosis not present

## 2021-08-06 DIAGNOSIS — I2510 Atherosclerotic heart disease of native coronary artery without angina pectoris: Secondary | ICD-10-CM | POA: Diagnosis not present

## 2021-08-06 DIAGNOSIS — Z96641 Presence of right artificial hip joint: Secondary | ICD-10-CM | POA: Diagnosis not present

## 2021-08-06 DIAGNOSIS — I10 Essential (primary) hypertension: Secondary | ICD-10-CM | POA: Diagnosis not present

## 2021-08-06 DIAGNOSIS — M069 Rheumatoid arthritis, unspecified: Secondary | ICD-10-CM | POA: Diagnosis not present

## 2021-08-06 DIAGNOSIS — E785 Hyperlipidemia, unspecified: Secondary | ICD-10-CM | POA: Diagnosis not present

## 2021-08-06 DIAGNOSIS — M5136 Other intervertebral disc degeneration, lumbar region: Secondary | ICD-10-CM | POA: Diagnosis not present

## 2021-08-09 ENCOUNTER — Telehealth: Payer: Self-pay

## 2021-08-09 DIAGNOSIS — J441 Chronic obstructive pulmonary disease with (acute) exacerbation: Secondary | ICD-10-CM | POA: Diagnosis not present

## 2021-08-09 DIAGNOSIS — Z96641 Presence of right artificial hip joint: Secondary | ICD-10-CM | POA: Diagnosis not present

## 2021-08-09 DIAGNOSIS — I2510 Atherosclerotic heart disease of native coronary artery without angina pectoris: Secondary | ICD-10-CM | POA: Diagnosis not present

## 2021-08-09 DIAGNOSIS — I4891 Unspecified atrial fibrillation: Secondary | ICD-10-CM | POA: Diagnosis not present

## 2021-08-09 DIAGNOSIS — E785 Hyperlipidemia, unspecified: Secondary | ICD-10-CM | POA: Diagnosis not present

## 2021-08-09 DIAGNOSIS — I10 Essential (primary) hypertension: Secondary | ICD-10-CM | POA: Diagnosis not present

## 2021-08-09 DIAGNOSIS — M5136 Other intervertebral disc degeneration, lumbar region: Secondary | ICD-10-CM | POA: Diagnosis not present

## 2021-08-09 DIAGNOSIS — K219 Gastro-esophageal reflux disease without esophagitis: Secondary | ICD-10-CM | POA: Diagnosis not present

## 2021-08-09 DIAGNOSIS — Z471 Aftercare following joint replacement surgery: Secondary | ICD-10-CM | POA: Diagnosis not present

## 2021-08-09 DIAGNOSIS — M069 Rheumatoid arthritis, unspecified: Secondary | ICD-10-CM | POA: Diagnosis not present

## 2021-08-09 NOTE — Telephone Encounter (Signed)
Copied from [place] [id number]. Topic: General - Other >> Aug 09, 2021  3:20 PM Yvette Rack wrote: Reason for CRM: Bess Harvest with [facility] requests new orders for speech therapy be faxed to them at fax# [phone]. Micah stated they had an order but pt had refused service however pt is now requesting that they come out. Cb# [phone]

## 2021-08-12 NOTE — Telephone Encounter (Signed)
Ok to order 

## 2021-08-12 NOTE — Telephone Encounter (Signed)
Faxed as below.

## 2021-08-13 ENCOUNTER — Other Ambulatory Visit: Payer: Self-pay | Admitting: Family Medicine

## 2021-08-13 DIAGNOSIS — J441 Chronic obstructive pulmonary disease with (acute) exacerbation: Secondary | ICD-10-CM | POA: Diagnosis not present

## 2021-08-13 DIAGNOSIS — I4891 Unspecified atrial fibrillation: Secondary | ICD-10-CM | POA: Diagnosis not present

## 2021-08-13 DIAGNOSIS — Z471 Aftercare following joint replacement surgery: Secondary | ICD-10-CM | POA: Diagnosis not present

## 2021-08-13 DIAGNOSIS — Z96641 Presence of right artificial hip joint: Secondary | ICD-10-CM | POA: Diagnosis not present

## 2021-08-13 DIAGNOSIS — I10 Essential (primary) hypertension: Secondary | ICD-10-CM | POA: Diagnosis not present

## 2021-08-13 DIAGNOSIS — M5136 Other intervertebral disc degeneration, lumbar region: Secondary | ICD-10-CM | POA: Diagnosis not present

## 2021-08-13 DIAGNOSIS — I2510 Atherosclerotic heart disease of native coronary artery without angina pectoris: Secondary | ICD-10-CM | POA: Diagnosis not present

## 2021-08-13 DIAGNOSIS — E785 Hyperlipidemia, unspecified: Secondary | ICD-10-CM | POA: Diagnosis not present

## 2021-08-13 DIAGNOSIS — K219 Gastro-esophageal reflux disease without esophagitis: Secondary | ICD-10-CM | POA: Diagnosis not present

## 2021-08-13 DIAGNOSIS — M069 Rheumatoid arthritis, unspecified: Secondary | ICD-10-CM | POA: Diagnosis not present

## 2021-08-13 NOTE — Telephone Encounter (Signed)
Courtesy refill. Future visit in 3 weeks

## 2021-08-13 NOTE — Telephone Encounter (Signed)
Requested medication (s) are due for refill today: last ordered by T. Arvella Nigh, PA  Requested medication (s) are on the active medication list: yes   Last refill:  07/14/21  Future visit scheduled: yes in 3 weeks   Notes to clinic:  no print . Last ordered by Damita Lack, PA. Last labs 02/16/2019. Do you want to renew Rx?     Requested Prescriptions  Pending Prescriptions Disp Refills   rosuvastatin (CRESTOR) 5 MG tablet [Pharmacy Med Name: ROSUVASTATIN CALCIUM 5 MG TAB] 90 tablet 1    Sig: TAKE 1 TABLET BY MOUTH EVERY DAY     Cardiovascular:  Antilipid - Statins Failed - 08/13/2021  1:14 PM      Failed - Total Cholesterol in normal range and within 360 days    Cholesterol, Total  Date Value Ref Range Status  02/16/2019 132 100 - 199 mg/dL Final          Failed - LDL in normal range and within 360 days    LDL Calculated  Date Value Ref Range Status  02/16/2019 62 0 - 99 mg/dL Final          Failed - HDL in normal range and within 360 days    HDL  Date Value Ref Range Status  02/16/2019 57 >39 mg/dL Final          Failed - Triglycerides in normal range and within 360 days    Triglycerides  Date Value Ref Range Status  02/16/2019 65 0 - 149 mg/dL Final          Passed - Patient is not pregnant      Passed - Valid encounter within last 12 months    Recent Outpatient Visits           8 months ago Hyperglycemia   [facility] Jerrol Banana., MD   10 months ago Dog bite of right lower leg with infection, initial encounter   [facility] Flinchum, Kelby Aline, FNP   11 months ago Encounter for annual physical exam   [facility] Jerrol Banana., MD   1 year ago Neuropathy   [facility] Jerrol Banana., MD   1 year ago Neuropathy   [facility] Jerrol Banana., MD       Future Appointments             In 3 weeks Jerrol Banana., MD [employer],  PEC            Signed Prescriptions Disp Refills   donepezil (ARICEPT) 5 MG tablet 90 tablet 0    Sig: TAKE 1 TABLET BY MOUTH EVERYDAY AT BEDTIME     Neurology:  Alzheimer's Agents Failed - 08/13/2021  1:14 PM      Failed - Valid encounter within last 6 months    Recent Outpatient Visits           8 months ago Hyperglycemia   [facility] Jerrol Banana., MD   10 months ago Dog bite of right lower leg with infection, initial encounter   [facility] Flinchum, Kelby Aline, FNP   11 months ago Encounter for annual physical exam   [facility] Jerrol Banana., MD   1 year ago Neuropathy   [facility] Jerrol Banana., MD   1 year ago Neuropathy   [facility] Jerrol Banana., MD  Future Appointments             In 3 weeks Jerrol Banana., MD [facility] [place]

## 2021-08-14 ENCOUNTER — Ambulatory Visit: Payer: Medicare HMO | Admitting: Family Medicine

## 2021-08-15 DIAGNOSIS — I10 Essential (primary) hypertension: Secondary | ICD-10-CM | POA: Diagnosis not present

## 2021-08-15 DIAGNOSIS — K219 Gastro-esophageal reflux disease without esophagitis: Secondary | ICD-10-CM | POA: Diagnosis not present

## 2021-08-15 DIAGNOSIS — I2510 Atherosclerotic heart disease of native coronary artery without angina pectoris: Secondary | ICD-10-CM | POA: Diagnosis not present

## 2021-08-15 DIAGNOSIS — M069 Rheumatoid arthritis, unspecified: Secondary | ICD-10-CM | POA: Diagnosis not present

## 2021-08-15 DIAGNOSIS — E785 Hyperlipidemia, unspecified: Secondary | ICD-10-CM | POA: Diagnosis not present

## 2021-08-15 DIAGNOSIS — M5136 Other intervertebral disc degeneration, lumbar region: Secondary | ICD-10-CM | POA: Diagnosis not present

## 2021-08-15 DIAGNOSIS — Z96641 Presence of right artificial hip joint: Secondary | ICD-10-CM | POA: Diagnosis not present

## 2021-08-15 DIAGNOSIS — I4891 Unspecified atrial fibrillation: Secondary | ICD-10-CM | POA: Diagnosis not present

## 2021-08-15 DIAGNOSIS — Z471 Aftercare following joint replacement surgery: Secondary | ICD-10-CM | POA: Diagnosis not present

## 2021-08-15 DIAGNOSIS — J441 Chronic obstructive pulmonary disease with (acute) exacerbation: Secondary | ICD-10-CM | POA: Diagnosis not present

## 2021-08-19 DIAGNOSIS — Z96641 Presence of right artificial hip joint: Secondary | ICD-10-CM | POA: Diagnosis not present

## 2021-08-19 DIAGNOSIS — J441 Chronic obstructive pulmonary disease with (acute) exacerbation: Secondary | ICD-10-CM | POA: Diagnosis not present

## 2021-08-19 DIAGNOSIS — M069 Rheumatoid arthritis, unspecified: Secondary | ICD-10-CM | POA: Diagnosis not present

## 2021-08-19 DIAGNOSIS — E785 Hyperlipidemia, unspecified: Secondary | ICD-10-CM | POA: Diagnosis not present

## 2021-08-19 DIAGNOSIS — Z471 Aftercare following joint replacement surgery: Secondary | ICD-10-CM | POA: Diagnosis not present

## 2021-08-19 DIAGNOSIS — I2510 Atherosclerotic heart disease of native coronary artery without angina pectoris: Secondary | ICD-10-CM | POA: Diagnosis not present

## 2021-08-19 DIAGNOSIS — K219 Gastro-esophageal reflux disease without esophagitis: Secondary | ICD-10-CM | POA: Diagnosis not present

## 2021-08-19 DIAGNOSIS — M5136 Other intervertebral disc degeneration, lumbar region: Secondary | ICD-10-CM | POA: Diagnosis not present

## 2021-08-19 DIAGNOSIS — I10 Essential (primary) hypertension: Secondary | ICD-10-CM | POA: Diagnosis not present

## 2021-08-19 DIAGNOSIS — I4891 Unspecified atrial fibrillation: Secondary | ICD-10-CM | POA: Diagnosis not present

## 2021-08-21 DIAGNOSIS — Z96641 Presence of right artificial hip joint: Secondary | ICD-10-CM | POA: Diagnosis not present

## 2021-08-21 DIAGNOSIS — I2510 Atherosclerotic heart disease of native coronary artery without angina pectoris: Secondary | ICD-10-CM | POA: Diagnosis not present

## 2021-08-21 DIAGNOSIS — I4891 Unspecified atrial fibrillation: Secondary | ICD-10-CM | POA: Diagnosis not present

## 2021-08-21 DIAGNOSIS — K219 Gastro-esophageal reflux disease without esophagitis: Secondary | ICD-10-CM | POA: Diagnosis not present

## 2021-08-21 DIAGNOSIS — Z471 Aftercare following joint replacement surgery: Secondary | ICD-10-CM | POA: Diagnosis not present

## 2021-08-21 DIAGNOSIS — M069 Rheumatoid arthritis, unspecified: Secondary | ICD-10-CM | POA: Diagnosis not present

## 2021-08-21 DIAGNOSIS — I10 Essential (primary) hypertension: Secondary | ICD-10-CM | POA: Diagnosis not present

## 2021-08-21 DIAGNOSIS — J441 Chronic obstructive pulmonary disease with (acute) exacerbation: Secondary | ICD-10-CM | POA: Diagnosis not present

## 2021-08-21 DIAGNOSIS — M5136 Other intervertebral disc degeneration, lumbar region: Secondary | ICD-10-CM | POA: Diagnosis not present

## 2021-08-21 DIAGNOSIS — E785 Hyperlipidemia, unspecified: Secondary | ICD-10-CM | POA: Diagnosis not present

## 2021-08-28 ENCOUNTER — Telehealth: Payer: Self-pay

## 2021-08-28 ENCOUNTER — Ambulatory Visit: Payer: Medicare HMO | Admitting: Family Medicine

## 2021-08-28 DIAGNOSIS — R350 Frequency of micturition: Secondary | ICD-10-CM | POA: Diagnosis not present

## 2021-08-28 DIAGNOSIS — M1611 Unilateral primary osteoarthritis, right hip: Secondary | ICD-10-CM | POA: Diagnosis not present

## 2021-08-28 DIAGNOSIS — N3000 Acute cystitis without hematuria: Secondary | ICD-10-CM | POA: Diagnosis not present

## 2021-08-28 DIAGNOSIS — Z96641 Presence of right artificial hip joint: Secondary | ICD-10-CM | POA: Diagnosis not present

## 2021-08-28 NOTE — Telephone Encounter (Signed)
Pt needs an appointment.  Advised Ms. Teresa [name] of her apt with Simona Huh 08/29/2021 at 11am.   Thanks,   -Mickel Baas

## 2021-08-28 NOTE — Telephone Encounter (Signed)
Copied from [place] [id number]. Topic: General - Other >> Aug 28, 2021  1:54 PM Wynetta Emery, [place] C wrote: Reason for CRM: pt called in to ask if provider will send in a Rx for a UTI. Pt says that she is experiencing frequent urination. Pt says that she is experiencing some back pain. Pt says that her stomach feel full. Pt says if need be she can drop off a sample to the office.   [id number] [place]  Phone:  [phone]:  [phone]  Phone: [phone](cell)   Please assist pt further.

## 2021-08-29 ENCOUNTER — Ambulatory Visit: Payer: Medicare HMO | Admitting: Family Medicine

## 2021-08-30 ENCOUNTER — Telehealth: Payer: Self-pay | Admitting: Family Medicine

## 2021-08-30 NOTE — Telephone Encounter (Signed)
FYI. Thanks.

## 2021-08-30 NOTE — Telephone Encounter (Signed)
Teresa [name] called to Report a declined PT appt for today and she is being discharged next week and is done with her PT sessions with [facility] /

## 2021-09-04 ENCOUNTER — Ambulatory Visit (INDEPENDENT_AMBULATORY_CARE_PROVIDER_SITE_OTHER): Payer: Medicare HMO | Admitting: Family Medicine

## 2021-09-04 ENCOUNTER — Encounter: Payer: Self-pay | Admitting: Family Medicine

## 2021-09-04 ENCOUNTER — Other Ambulatory Visit: Payer: Self-pay

## 2021-09-04 VITALS — BP 124/74 | HR 78 | Resp 18 | Ht 64.0 in | Wt 171.0 lb

## 2021-09-04 DIAGNOSIS — G3184 Mild cognitive impairment, so stated: Secondary | ICD-10-CM | POA: Diagnosis not present

## 2021-09-04 DIAGNOSIS — E785 Hyperlipidemia, unspecified: Secondary | ICD-10-CM

## 2021-09-04 DIAGNOSIS — R739 Hyperglycemia, unspecified: Secondary | ICD-10-CM | POA: Diagnosis not present

## 2021-09-04 DIAGNOSIS — I4891 Unspecified atrial fibrillation: Secondary | ICD-10-CM | POA: Diagnosis not present

## 2021-09-04 DIAGNOSIS — Z23 Encounter for immunization: Secondary | ICD-10-CM

## 2021-09-04 DIAGNOSIS — I5022 Chronic systolic (congestive) heart failure: Secondary | ICD-10-CM

## 2021-09-04 NOTE — Patient Instructions (Addendum)
Decrease Gabapentin to one tablet twice a day. Then decrease to

## 2021-09-04 NOTE — Progress Notes (Signed)
I,[name],acting as a scribe for [name], MD.,have documented all relevant documentation on the behalf of [name], MD,as directed by  [name], MD while in the presence of [name], MD.   Established patient visit   Patient: Teresa [name]   DOB: 1941/03/26   80 y.o. Female  MRN: [id number] Visit Date: 09/04/2021  Today's healthcare provider: [name], MD   No chief complaint on file.  Subjective    HPI  All patient is feeling well.  She is recovering slowly from back surgery.  She is having less nerve pain and is interested in possibly coming off the gabapentin.  She is feeling older and her MCI does not appear to be progressive. Lipid/Cholesterol, Follow-up  Last lipid panel Other pertinent labs  Lab Results  Component Value Date   CHOL 132 02/16/2019   HDL 57 02/16/2019   LDLCALC 62 02/16/2019   TRIG 65 02/16/2019   CHOLHDL 2.3 02/16/2019   Lab Results  Component Value Date   ALT 15 07/02/2021   AST 22 07/02/2021   PLT 173 07/13/2021   TSH 2.130 05/30/2020     She was last seen for this 9 months ago.  Management since that visit includes no medication changes.  She reports fair compliance with treatment. She is not having side effects. none  Current exercise: none  The ASCVD Risk score (Arnett DK, et al., 2019) failed to calculate for the following reasons:   The patient has a prior MI or stroke diagnosis  ---------------------------------------------------------------------------------------------------     Medications: Outpatient Medications Prior to Visit  Medication Sig   acetaminophen (TYLENOL) 650 MG CR tablet Take 650 mg by mouth every 8 (eight) hours as needed for pain.   apixaban (ELIQUIS) 5 MG TABS tablet Take 1 tablet (5 mg total) by mouth 2 (two) times daily.   budesonide-formoterol (SYMBICORT) 160-4.5 MCG/ACT inhaler Inhale 2 puffs into the lungs 2 (two) times daily.   cetirizine  (ZYRTEC) 10 MG tablet Take 1 tablet (10 mg total) by mouth daily.   docusate sodium (COLACE) 100 MG capsule Take 1 capsule (100 mg total) by mouth 2 (two) times daily.   donepezil (ARICEPT) 5 MG tablet TAKE 1 TABLET BY MOUTH EVERYDAY AT BEDTIME   fluticasone (FLONASE) 50 MCG/ACT nasal spray Place 2 sprays into both nostrils daily as needed for allergies.   furosemide (LASIX) 20 MG tablet TAKE 1 TABLET (20 MG TOTAL) BY MOUTH DAILY AS NEEDED (SHORTNESS OF BREATH).   gabapentin (NEURONTIN) 300 MG capsule Take 2 capsules (600 mg total) by mouth 2 (two) times daily.   HYDROcodone-acetaminophen (NORCO/VICODIN) 5-325 MG tablet Take 1-2 tablets by mouth every 4 (four) hours as needed for moderate pain (pain score 4-6).   methocarbamol (ROBAXIN) 500 MG tablet Take 1 tablet (500 mg total) by mouth every 6 (six) hours as needed for muscle spasms.   Multiple Vitamins-Minerals (MULTIVITAMIN PO) Take 1 tablet by mouth daily.   OTEZLA 30 MG TABS Take 30 mg by mouth in the morning and at bedtime.   rosuvastatin (CRESTOR) 5 MG tablet TAKE 1 TABLET BY MOUTH EVERY DAY   traMADol (ULTRAM) 50 MG tablet Take 1 tablet (50 mg total) by mouth every 6 (six) hours as needed.   venlafaxine (EFFEXOR) 75 MG tablet TAKE 2 TABLETS (150 MG TOTAL) BY MOUTH DAILY.   No facility-administered medications prior to visit.    Review of Systems  Constitutional:  Negative for  activity change and fatigue.  Respiratory:  Negative for cough and shortness of breath.   Cardiovascular:  Negative for chest pain, palpitations and leg swelling.  Neurological:  Negative for dizziness, light-headedness and headaches.  Psychiatric/Behavioral:  Negative for agitation, decreased concentration, self-injury, sleep disturbance and suicidal ideas. The patient is not nervous/anxious.        Objective    There were no vitals taken for this visit. BP Readings from Last 3 Encounters:  09/04/21 124/74  07/16/21 (!) 119/37  07/02/21 (!) 119/51   Wt  Readings from Last 3 Encounters:  09/04/21 171 lb (77.6 kg)  07/11/21 176 lb (79.8 kg)  07/02/21 175 lb (79.4 kg)      Physical Exam Vitals reviewed.  Constitutional:      Appearance: Normal appearance. She is well-developed.  HENT:     Head: Normocephalic and atraumatic.     Right Ear: External ear normal.     Left Ear: External ear normal.     Nose: Nose normal.     Mouth/Throat:     Pharynx: Oropharynx is clear.  Eyes:     General: No scleral icterus.    Conjunctiva/sclera: Conjunctivae normal.  Neck:     Thyroid: No thyromegaly.  Cardiovascular:     Rate and Rhythm: Normal rate and regular rhythm.     Heart sounds: Normal heart sounds.  Pulmonary:     Effort: Pulmonary effort is normal.     Breath sounds: Normal breath sounds.  Chest:  Breasts:    Right: Normal.     Left: Normal.  Abdominal:     Palpations: Abdomen is soft.  Skin:    General: Skin is warm and dry.  Neurological:     General: No focal deficit present.     Mental Status: She is alert and oriented to person, place, and time. Mental status is at baseline.  Psychiatric:        Mood and Affect: Mood normal.        Behavior: Behavior normal.        Thought Content: Thought content normal.        Judgment: Judgment normal.      No results found for any visits on 09/04/21.  Assessment & Plan     1. Hyperglycemia  - Hemoglobin A1c  2. Hyperlipidemia, unspecified hyperlipidemia type On Crestor 5 - Comprehensive metabolic panel - Lipid panel - TSH  3. MCI (mild cognitive impairment) MMSE on next visit.  4. Atrial fibrillation, unspecified type (HCC) On Eliquis - TSH  5. Chronic systolic heart failure (HCC)  - CBC with Differential/Platelet  6. Need for influenza vaccination  - Flu Vaccine QUAD High Dose(Fluad) 7.Neuropathy Wean gabapentin.  No follow-ups on file.      I, [name], MD, have reviewed all documentation for this visit. The documentation on 09/08/21 for  the exam, diagnosis, procedures, and orders are all accurate and complete.    Eulah Walkup Cranford Mon, MD  [facility] [phone] (phone) [phone] (fax)  [place]

## 2021-09-05 DIAGNOSIS — M069 Rheumatoid arthritis, unspecified: Secondary | ICD-10-CM | POA: Diagnosis not present

## 2021-09-05 DIAGNOSIS — Z471 Aftercare following joint replacement surgery: Secondary | ICD-10-CM | POA: Diagnosis not present

## 2021-09-05 DIAGNOSIS — I10 Essential (primary) hypertension: Secondary | ICD-10-CM | POA: Diagnosis not present

## 2021-09-05 DIAGNOSIS — J441 Chronic obstructive pulmonary disease with (acute) exacerbation: Secondary | ICD-10-CM | POA: Diagnosis not present

## 2021-09-05 DIAGNOSIS — K219 Gastro-esophageal reflux disease without esophagitis: Secondary | ICD-10-CM | POA: Diagnosis not present

## 2021-09-05 DIAGNOSIS — Z96641 Presence of right artificial hip joint: Secondary | ICD-10-CM | POA: Diagnosis not present

## 2021-09-05 DIAGNOSIS — M5136 Other intervertebral disc degeneration, lumbar region: Secondary | ICD-10-CM | POA: Diagnosis not present

## 2021-09-05 DIAGNOSIS — I4891 Unspecified atrial fibrillation: Secondary | ICD-10-CM | POA: Diagnosis not present

## 2021-09-05 DIAGNOSIS — E785 Hyperlipidemia, unspecified: Secondary | ICD-10-CM | POA: Diagnosis not present

## 2021-09-05 DIAGNOSIS — I2510 Atherosclerotic heart disease of native coronary artery without angina pectoris: Secondary | ICD-10-CM | POA: Diagnosis not present

## 2021-09-06 DIAGNOSIS — I4891 Unspecified atrial fibrillation: Secondary | ICD-10-CM | POA: Diagnosis not present

## 2021-09-06 DIAGNOSIS — R739 Hyperglycemia, unspecified: Secondary | ICD-10-CM | POA: Diagnosis not present

## 2021-09-06 DIAGNOSIS — I5022 Chronic systolic (congestive) heart failure: Secondary | ICD-10-CM | POA: Diagnosis not present

## 2021-09-06 DIAGNOSIS — E785 Hyperlipidemia, unspecified: Secondary | ICD-10-CM | POA: Diagnosis not present

## 2021-09-07 LAB — COMPREHENSIVE METABOLIC PANEL
ALT: 10 IU/L (ref 0–32)
AST: 20 IU/L (ref 0–40)
Albumin/Globulin Ratio: 2 (ref 1.2–2.2)
Albumin: 4.3 g/dL (ref 3.7–4.7)
Alkaline Phosphatase: 98 IU/L (ref 44–121)
BUN/Creatinine Ratio: 10 — ABNORMAL LOW (ref 12–28)
BUN: 9 mg/dL (ref 8–27)
Bilirubin Total: 0.3 mg/dL (ref 0.0–1.2)
CO2: 23 mmol/L (ref 20–29)
Calcium: 9.8 mg/dL (ref 8.7–10.3)
Chloride: 106 mmol/L (ref 96–106)
Creatinine, Ser: 0.89 mg/dL (ref 0.57–1.00)
Globulin, Total: 2.2 g/dL (ref 1.5–4.5)
Glucose: 98 mg/dL (ref 65–99)
Potassium: 5 mmol/L (ref 3.5–5.2)
Sodium: 145 mmol/L — ABNORMAL HIGH (ref 134–144)
Total Protein: 6.5 g/dL (ref 6.0–8.5)
eGFR: 66 mL/min/{1.73_m2} (ref >59)

## 2021-09-07 LAB — TSH: TSH: 1.25 u[IU]/mL (ref 0.450–4.500)

## 2021-09-07 LAB — CBC WITH DIFFERENTIAL/PLATELET
Basophils Absolute: 0.1 10*3/uL (ref 0.0–0.2)
Basos: 1 %
EOS (ABSOLUTE): 0.4 10*3/uL (ref 0.0–0.4)
Eos: 7 %
Hematocrit: 41.7 % (ref 34.0–46.6)
Hemoglobin: 13.7 g/dL (ref 11.1–15.9)
Immature Grans (Abs): 0 10*3/uL (ref 0.0–0.1)
Immature Granulocytes: 0 %
Lymphocytes Absolute: 1.7 10*3/uL (ref 0.7–3.1)
Lymphs: 30 %
MCH: 29.1 pg (ref 26.6–33.0)
MCHC: 32.9 g/dL (ref 31.5–35.7)
MCV: 89 fL (ref 79–97)
Monocytes Absolute: 0.7 10*3/uL (ref 0.1–0.9)
Monocytes: 13 %
Neutrophils Absolute: 2.8 10*3/uL (ref 1.4–7.0)
Neutrophils: 49 %
Platelets: 270 10*3/uL (ref 150–450)
RBC: 4.7 x10E6/uL (ref 3.77–5.28)
RDW: 12.3 % (ref 11.7–15.4)
WBC: 5.7 10*3/uL (ref 3.4–10.8)

## 2021-09-07 LAB — LIPID PANEL
Chol/HDL Ratio: 2.3 ratio (ref 0.0–4.4)
Cholesterol, Total: 162 mg/dL (ref 100–199)
HDL: 71 mg/dL (ref >39)
LDL Chol Calc (NIH): 79 mg/dL (ref 0–99)
Triglycerides: 59 mg/dL (ref 0–149)
VLDL Cholesterol Cal: 12 mg/dL (ref 5–40)

## 2021-09-07 LAB — HEMOGLOBIN A1C
Est. average glucose Bld gHb Est-mCnc: 111 mg/dL
Hgb A1c MFr Bld: 5.5 % (ref 4.8–5.6)

## 2021-09-11 ENCOUNTER — Other Ambulatory Visit: Payer: Self-pay | Admitting: Family Medicine

## 2021-09-11 DIAGNOSIS — Z1231 Encounter for screening mammogram for malignant neoplasm of breast: Secondary | ICD-10-CM

## 2021-09-26 ENCOUNTER — Ambulatory Visit: Payer: Medicare HMO

## 2021-10-01 DIAGNOSIS — Z961 Presence of intraocular lens: Secondary | ICD-10-CM | POA: Diagnosis not present

## 2021-10-10 ENCOUNTER — Other Ambulatory Visit: Payer: Self-pay | Admitting: Family Medicine

## 2021-10-11 NOTE — Telephone Encounter (Signed)
Requested medications are due for refill today Too early for Aricept, dose/signature inconsistent with current Gabapentin order  Requested medications are on the active medication list Aricept is, Gabapentin at a different dose  Last refill 08/13/21 for Aricept for 90 day supply  Last visit 09/04/21  Future visit scheduled 01/07/22  Notes to clinic please assess.  Requested Prescriptions  Pending Prescriptions Disp Refills   gabapentin (NEURONTIN) 300 MG capsule [Pharmacy Med Name: GABAPENTIN 300 MG CAPSULE] 150 capsule 5    Sig: TAKE TWO TABS BY MOUTH IN THE AM, TWO TABLETS BY MOUTH AT BEDTIME, AND 1 TABLET AT DINNER AS NEEDED.     Neurology: Anticonvulsants - gabapentin Passed - 10/10/2021  2:00 PM      Passed - Valid encounter within last 12 months    Recent Outpatient Visits           1 month ago Hyperglycemia   [facility] Jerrol Banana., MD   10 months ago Hyperglycemia   [facility] Jerrol Banana., MD   1 year ago Dog bite of right lower leg with infection, initial encounter   [facility] Flinchum, Kelby Aline, FNP   1 year ago Encounter for annual physical exam   [facility] Jerrol Banana., MD   1 year ago Neuropathy   [facility] Jerrol Banana., MD       Future Appointments             In 2 months Jerrol Banana., MD [facility]             donepezil (ARICEPT) 5 MG tablet [Pharmacy Med Name: DONEPEZIL HCL 5 MG TABLET] 90 tablet 0    Sig: TAKE 1 TABLET BY MOUTH EVERYDAY AT BEDTIME     Neurology:  Alzheimer's Agents Passed - 10/10/2021  2:00 PM      Passed - Valid encounter within last 6 months    Recent Outpatient Visits           1 month ago Hyperglycemia   [facility] Jerrol Banana., MD   10 months ago Hyperglycemia   [facility] Jerrol Banana., MD   1 year ago Dog bite of  right lower leg with infection, initial encounter   [facility] Flinchum, Kelby Aline, FNP   1 year ago Encounter for annual physical exam   [facility] Jerrol Banana., MD   1 year ago Neuropathy   [facility] Jerrol Banana., MD       Future Appointments             In 2 months Jerrol Banana., MD [facility], [place]

## 2021-10-24 DIAGNOSIS — Z79899 Other long term (current) drug therapy: Secondary | ICD-10-CM | POA: Diagnosis not present

## 2021-10-24 DIAGNOSIS — M539 Dorsopathy, unspecified: Secondary | ICD-10-CM | POA: Diagnosis not present

## 2021-10-24 DIAGNOSIS — M159 Polyosteoarthritis, unspecified: Secondary | ICD-10-CM | POA: Diagnosis not present

## 2021-10-24 DIAGNOSIS — L4050 Arthropathic psoriasis, unspecified: Secondary | ICD-10-CM | POA: Diagnosis not present

## 2021-10-24 DIAGNOSIS — L409 Psoriasis, unspecified: Secondary | ICD-10-CM | POA: Diagnosis not present

## 2021-10-25 DIAGNOSIS — G309 Alzheimer's disease, unspecified: Secondary | ICD-10-CM | POA: Diagnosis not present

## 2021-10-25 DIAGNOSIS — R69 Illness, unspecified: Secondary | ICD-10-CM | POA: Diagnosis not present

## 2021-10-25 DIAGNOSIS — F0280 Dementia in other diseases classified elsewhere without behavioral disturbance: Secondary | ICD-10-CM | POA: Diagnosis not present

## 2021-10-25 DIAGNOSIS — Z9181 History of falling: Secondary | ICD-10-CM | POA: Diagnosis not present

## 2021-10-28 NOTE — Progress Notes (Signed)
 ELECTROPHYSIOLOGY -  RETURN VISIT   PRIMARY CARE PROVIDER:  Bertrum Charlie Raring, MD PRIMARY ELECTROPHYSIOLOGIST: Dr. Ubaldo Mace                 Earlene, Bjelland 10/29/2021  DOB: 05/11/1941 Age: 80 y.o.   HPI   PATIENT PROFILE: Teresa [name]  is a 80 y.o. female who presents for follow up of:   Atrial fibrillation PVI 05/2019  CLINICAL SUMMARY: Paroxysmal atrial fibrillation status post PVI 05/2019 CHA2DS2-VASc 5 (CHF, HTN, age >65, vascular disease, female) OAC with Eliquis   PVCs  CAD s/p CABG 1994  Ischemic cardiomyopathy, LVEF 45% by echocardiogram 05/31/2021 HTN Hyperlipidemia COPD Breast cancer  INTERIM HISTORY: Since her last visit with Dr. Ubaldo Mace 01/25/2021, she underwent a right hip replacement 06/2020 without complication.  She denies chest discomfort, shortness of breath, palpitations, lightheadedness, near-syncope, or syncope.  Pertinent details of the medical, social and family history were reviewed and are unchanged except as noted.  HISTORY   PROBLEM LIST: Patient Active Problem List  Diagnosis  . S/P CABG (coronary artery bypass graft)  . GERD (gastroesophageal reflux disease)  . Breast CA (CMS-HCC)  . Coronary disease  . Breast cancer (GWK)  . Osteoarthritis(GWK)  . Lumbosacral radiculopathy  . Intervertebral disc disorder with radiculopathy of lumbar region  . DDD (degenerative disc disease), lumbar  . Status post total left knee replacement  . Gastro-esophageal reflux disease without esophagitis  . Atherosclerotic heart disease of native coronary artery without angina pectoris  . Hyperlipidemia  . Ischemic cardiomyopathy  . Radiculopathy of lumbosacral region  . Syncope and collapse  . Psoriasis, unspecified  . Ventricular premature depolarization  . Primary osteoarthritis of both first carpometacarpal joints  . Heberden's nodes  . Acid reflux  . Arthritis, degenerative  . Malignant neoplasm of female breast (CMS-HCC)  . CAD  (coronary artery disease)  . L-S radiculopathy  . Pre-syncope  . PVC's (premature ventricular contractions)  . Pain syndrome, chronic  . Lumbar polyradiculopathy  . Psoriatic arthritis (CMS-HCC)  . Encounter for long-term (current) use of high-risk medication  . Paroxysmal atrial fibrillation (CMS-HCC)  . Fusion of spine of cervical region  . Anticoagulated  . Status post catheter ablation of atrial fibrillation  . Asthma  . Chronic obstructive pulmonary disease (CMS-HCC)  . Rheumatoid arthritis (CMS-HCC)  . Unstable angina (CMS-HCC)  . Dog bite of right lower leg with infection  . Need for Tdap vaccination  . Neuropathy  . Puncture wound  . Status post total hip replacement, right   PROBLEMS ADDRESSED AT THIS VISIT: Atrial fibrillation with rapid ventricular response (CMS-HCC)   CURRENT MEDICATIONS AND ALLERGIES   Current Outpatient Medications  Medication Sig Dispense Refill  . acetaminophen  (TYLENOL ) 650 MG ER tablet 1 tablet every 8-12 hours as needed for joint pain, 90 days 270 tablet 1  . apixaban  (ELIQUIS ) 5 mg tablet Take 1 tablet (5 mg total) by mouth 2 (two) times daily 180 tablet 4  . apremilast  (OTEZLA ) 30 mg tablet Take 1 tablet (30 mg total) by mouth 2 (two) times daily 180 tablet 3  . budesonide -formoteroL  (SYMBICORT ) 160-4.5 mcg/actuation inhaler Inhale 2 inhalations into the lungs as needed    . cetirizine  (ZYRTEC ) 10 MG tablet Take 10 mg by mouth once daily    . cholecalciferol (VITAMIN D3) 1000 unit capsule Take 1,000 Units by mouth once daily       . docusate (COLACE) 100 MG capsule Take 100 mg by mouth 2 (  two) times daily as needed    . donepeziL  (ARICEPT ) 5 MG tablet Take 5 mg by mouth nightly    . fluticasone  (FLONASE ) 50 mcg/actuation nasal spray Place 2 sprays into both nostrils once daily as needed    12  . gabapentin  (NEURONTIN ) 300 MG capsule Take 300 mg by mouth 2 (two) times daily for neuropathic pain. Do not take with other neuroleptics (Lyrica ,  cymbalta, amitriptyline, etc). Pt. States she is taking 3 times a day currently 05/11/2020    . HYDROcodone -acetaminophen  (NORCO) 5-325 mg tablet Take by mouth    . memantine  (NAMENDA ) 10 MG tablet Take one pill twice a day after finishing 5 mg pill titration 180 tablet 4  . memantine  (NAMENDA ) 5 MG tablet One at night for one week then one twice a day for two weeks then one in the morning and two at night for two weeks then two twice a day 100 tablet 0  . methocarbamoL  (ROBAXIN ) 500 MG tablet Take by mouth    . metoprolol  succinate (TOPROL -XL) 50 MG XL tablet Take 1 tablet (50 mg total) by mouth once daily 90 tablet 3  . multivitamin capsule Take 1 capsule by mouth once daily    . rosuvastatin  (CRESTOR ) 5 MG tablet Take 5 mg by mouth once daily    . sacubitriL -valsartan  (ENTRESTO ) 24-26 mg tablet Take 1 tablet by mouth 2 (two) times daily 180 tablet 3  . traMADoL  (ULTRAM ) 50 mg tablet Take 1 tablet (50 mg total) by mouth every 6 (six) hours as needed for Pain TAKE 1 TABLET BY MOUTH EVERY DAY AS NEEDED FOR JOINT PAIN 40 tablet 2  . venlafaxine  (EFFEXOR ) 75 MG tablet Take 75 mg by mouth once daily Pt. States she takes 2 tablets daily now 05/11/2020       No current facility-administered medications for this visit.    Allergies  Allergen Reactions  . Codeine Other (See Comments)    ALTERED MENTAL STATUS  . Iodinated Contrast Media Swelling  . Sulfa (Sulfonamide Antibiotics) Hives  . Amiodarone  Nausea And Vomiting   An updated, reconciled list of all medications, including OTC and herbal, was reviewed and a copy of the updated list was provided to the patient. Treatment plan and medications were discussed with the patient who voiced understanding. No barriers to learning.   REVIEW OF SYSTEMS   Comprehensive review of systems is completed and is negative except as indicated in the HPI.  PHYSICAL EXAM   Vitals:   10/29/21 1431  BP: (!) 147/80  Pulse: 80  Resp: 18  rechecked  138/68  Ht:162.6 cm (5' 4) Wt:76.2 kg (168 lb) ADJ:Anib surface area is 1.85 meters squared.   Physical Exam Constitutional:      General: She is not in acute distress. HENT:     Head: Normocephalic and atraumatic.  Eyes:     Extraocular Movements: Extraocular movements intact.  Neck:     Vascular: No carotid bruit or JVD.  Cardiovascular:     Rate and Rhythm: Normal rate and regular rhythm.  No extrasystoles are present.    Pulses: Intact distal pulses.     Heart sounds: Normal heart sounds.  Pulmonary:     Breath sounds: Normal breath sounds. No wheezing, rhonchi or rales.  Abdominal:     Palpations: Abdomen is soft.     Tenderness: There is no abdominal tenderness.  Musculoskeletal:        General: No swelling.  Skin:    General: Skin is  warm and dry.  Neurological:     General: No focal deficit present.     Mental Status: She is alert and oriented to person, place, and time.  Psychiatric:        Behavior: Behavior normal.     DATA/RESULTS   Labs: Hematology Lab Results  Component Value Date   WBC 5.5 03/01/2021   HGB 13.7 03/01/2021   HCT 44.1 03/01/2021   MCV 97 03/01/2021   PLT 223 03/01/2021   Chemistries Lab Results  Component Value Date   CREATININE 0.9 03/01/2021   BUN 17 03/01/2021   NA 141 03/01/2021   K  03/01/2021     Comment:     SPECIMEN GROSSLY HEMOLYZED. UNABLE TO REPORT DUE TO INTERFERENCE CAUSED BY THIS DEGREE OF HEMOLYSIS.   CL 103 03/01/2021   CO2 28 03/01/2021   Liver Function Studies Lab Results  Component Value Date   ALT 20 03/01/2021   AST 33 03/01/2021   ALKPHOS 72 03/01/2021   Thyroid  Function Studies Lab Results  Component Value Date   TSH 0.42 09/26/2018   INR Lab Results  Component Value Date   INR 1.3 (H) 10/07/2019   INR 1.8 (H) 05/25/2019   EKG: 10/29/2021 personally reviewed by me: Sinus rhythm, sinus arrhythmia, first-degree AV block.  Ventricular rate 64 bpm.  PR interval 260 ms, QRS 146 ms, QTC 491 ms.   Left bundle branch block pattern.  ASSESSMENT AND PLAN   Paroxysmal atrial fibrillation status post ablation with no recurrences reported Continue OAC with Eliquis  for CHA2DS2-VASc 5  No orders of the defined types were placed in this encounter.   DISPOSITION   Return in about 1 year (around 10/29/2022).  I spent a total of 30-39 minutes in both face-to-face and non-face-to-face activities for this visit on the date of this encounter.   I personally performed the service, non-incident to. (WP)   Leita LITTIE Douglass ALPHONSA, PA-C Division of Cardiology / Electrophysiology [facility]   Phone: [phone] Fax: [phone]

## 2021-10-29 DIAGNOSIS — I480 Paroxysmal atrial fibrillation: Secondary | ICD-10-CM | POA: Diagnosis not present

## 2021-10-29 DIAGNOSIS — I4891 Unspecified atrial fibrillation: Secondary | ICD-10-CM | POA: Diagnosis not present

## 2021-11-13 ENCOUNTER — Ambulatory Visit: Payer: Self-pay | Admitting: *Deleted

## 2021-11-13 NOTE — Telephone Encounter (Signed)
Please advise 

## 2021-11-13 NOTE — Telephone Encounter (Signed)
Pt's daughter calling initially, pt not present. Pt conferenced in. Reports "Dry heaving" onset this AM "But haven't had any in about 1 -1/2 hours. Denies vomiting, Mild nausea "A little sometimes." Reports headache 5/10, has taken Tramadol and tylenol today, "Helps." Denies any diarrhea, states is staying hydrated. Checked BP during call, 147/80  HR 60. Denies any CP, no palpitations. States has been eating, last ate cheese sandwich. Daughter states pt had C/O nausea about a week or two ago. Pt started Namenda at that time per daughters report, did not see on current profile. Assured pt and daughter NT would route to practice for PCPs review. Home care advise given. Advised any worsening symptoms call back. Pt and daughter verbalize understanding.      Reason for Disposition  Unexplained nausea  Answer Assessment - Initial Assessment Questions 1. NAUSEA SEVERITY: "How bad is the nausea?" (e.g., mild, moderate, severe; dehydration, weight loss)   - MILD: loss of appetite without change in eating habits   - MODERATE: decreased oral intake without significant weight loss, dehydration, or malnutrition   - SEVERE: inadequate caloric or fluid intake, significant weight loss, symptoms of dehydration     Mild nausea, headache, 5/10, constant, behind eyes 2. ONSET: "When did the nausea begin?"     This afternoon 3. VOMITING: "Any vomiting?" If Yes, ask: "How many times today?"     no 4. RECURRENT SYMPTOM: "Have you had nausea before?" If Yes, ask: "When was the last time?" "What happened that time?"     One month ago reported nausea 5. CAUSE: "What do you think is causing the nausea?"     Unsure  Protocols used: Nausea-A-AH

## 2021-11-25 DIAGNOSIS — Z96641 Presence of right artificial hip joint: Secondary | ICD-10-CM | POA: Diagnosis not present

## 2021-11-25 DIAGNOSIS — M4807 Spinal stenosis, lumbosacral region: Secondary | ICD-10-CM | POA: Diagnosis not present

## 2021-11-25 DIAGNOSIS — M1611 Unilateral primary osteoarthritis, right hip: Secondary | ICD-10-CM | POA: Diagnosis not present

## 2021-11-27 ENCOUNTER — Other Ambulatory Visit: Payer: Self-pay | Admitting: Orthopedic Surgery

## 2021-11-27 DIAGNOSIS — Z96641 Presence of right artificial hip joint: Secondary | ICD-10-CM

## 2021-11-27 DIAGNOSIS — M4807 Spinal stenosis, lumbosacral region: Secondary | ICD-10-CM

## 2021-12-03 ENCOUNTER — Other Ambulatory Visit: Payer: Medicare HMO

## 2021-12-03 ENCOUNTER — Ambulatory Visit: Admission: RE | Admit: 2021-12-03 | Payer: Medicare HMO | Source: Ambulatory Visit

## 2021-12-25 ENCOUNTER — Other Ambulatory Visit: Payer: Self-pay | Admitting: Family Medicine

## 2021-12-25 DIAGNOSIS — J309 Allergic rhinitis, unspecified: Secondary | ICD-10-CM

## 2021-12-26 ENCOUNTER — Other Ambulatory Visit: Payer: Self-pay

## 2021-12-26 ENCOUNTER — Ambulatory Visit
Admission: RE | Admit: 2021-12-26 | Discharge: 2021-12-26 | Disposition: A | Discharge status: Home / Self Care | Payer: Medicare HMO | Source: Ambulatory Visit | Attending: Family Medicine | Admitting: Family Medicine

## 2021-12-26 DIAGNOSIS — Z1231 Encounter for screening mammogram for malignant neoplasm of breast: Secondary | ICD-10-CM | POA: Insufficient documentation

## 2021-12-30 ENCOUNTER — Telehealth: Payer: Self-pay | Admitting: Family Medicine

## 2021-12-30 NOTE — Telephone Encounter (Signed)
Patient was called to be advised that for [place] to change the order to diagnostic we would need more information and some documentation in the chart.  After speaking with the patient she revealed that it is very possible that this is only scar tissue as it is at the site of previous surgery.  She was given the option of making an appointment sooner, or waiting until her existing appointment with later this month.  She has chosen to wait until Jan 17 for her appointment with Dr Ky Barban.

## 2021-12-30 NOTE — Telephone Encounter (Signed)
Referral Request - Has patient seen PCP for this complaint? No. *If NO, is insurance requiring patient see PCP for this issue before PCP can refer them? Referral for which specialty: mammogram Preferred provider/office: [place]  Reason for referral: pt went to get her yearly mammogram and pt showed the technician a place on her breast.  The tech said she felt better if pt would call hre dr and get order for a diagnostic mammagram

## 2022-01-07 ENCOUNTER — Ambulatory Visit: Payer: Medicare HMO | Admitting: Family Medicine

## 2022-01-13 NOTE — Telephone Encounter (Signed)
Patient states she did not keep her 01/07/2022 appointment with Dr. Ky Barban stating it does not make sense for her to come in and have PCP feel on it. Please send diagnostic mammo order to imaging location. Patient would like a follow up call when orders has been placed.

## 2022-01-13 NOTE — Telephone Encounter (Signed)
Please advise 

## 2022-01-14 ENCOUNTER — Other Ambulatory Visit: Payer: Self-pay | Admitting: Family Medicine

## 2022-01-14 DIAGNOSIS — N63 Unspecified lump in unspecified breast: Secondary | ICD-10-CM

## 2022-01-15 ENCOUNTER — Telehealth: Payer: Self-pay

## 2022-01-15 NOTE — Progress Notes (Signed)
Chronic Care Management Pharmacy Assistant   Name: Teresa [name]  MRN: [id number] DOB: 1941-11-13  Reason for Encounter: Medication Review/General Adherence Call.   Recent office visits:  09/04/2021 Dr. Caryn Section MD (PCP) No medication changes noted.  Recent consult visits:  11/25/2021 Dr. Rudene Christians MD (Orthopedic Surgery) Unable to see note 10/25/2021 Dr. Werner Lean MD (Neurology) Unable to see note 09/02/2021 Dr, Glennon Mac MD (Cardiology) restart Metoprolol 25 mg BID, restart Entresto. 07/10/2021 Dr. Rudene Christians MD (Orthopedic Surgery) No medication changes noted.  Hospital visits:  Medication Reconciliation was completed by comparing discharge summary, patients EMR and Pharmacy list, and upon discussion with patient.  Admitted to the hospital on 08/28/2021 due to Acute Cystitis without hematuria. Discharge date was 08/28/2021. Discharged from [facility].   New?Medications Started at [facility] Discharge:?? -started cephalexin 500 MG   Medication Changes at Hospital Discharge: -Changed None  Medications Discontinued at Hospital Discharge: -Stopped none  Medications that remain the same after Hospital Discharge:??  -All other medications will remain the same.    Admitted to the hospital on 07/11/2021 due to Total hip replacement. Discharge date was 07/16/2021. Discharged from [place]?Medications Started at [facility] Discharge:?? -started None  Medication Changes at Hospital Discharge: -Changed None  Medications Discontinued at Hospital Discharge: -Stopped Entresto - Stopped Metoprolol  Medications that remain the same after Hospital Discharge:??  -All other medications will remain the same.    Medications: Outpatient Encounter Medications as of 01/15/2022  Medication Sig   donepezil (ARICEPT) 5 MG tablet TAKE 1 TABLET BY MOUTH EVERYDAY AT BEDTIME   gabapentin (NEURONTIN) 300 MG capsule TAKE TWO TABS BY MOUTH IN THE AM, TWO TABLETS BY  MOUTH AT BEDTIME, AND 1 TABLET AT DINNER AS NEEDED.   acetaminophen (TYLENOL) 650 MG CR tablet Take 650 mg by mouth every 8 (eight) hours as needed for pain.   apixaban (ELIQUIS) 5 MG TABS tablet Take 1 tablet (5 mg total) by mouth 2 (two) times daily.   budesonide-formoterol (SYMBICORT) 160-4.5 MCG/ACT inhaler Inhale 2 puffs into the lungs 2 (two) times daily.   cetirizine (ZYRTEC) 10 MG tablet TAKE 1 TABLET BY MOUTH EVERY DAY   docusate sodium (COLACE) 100 MG capsule Take 1 capsule (100 mg total) by mouth 2 (two) times daily. (Patient not taking: Reported on 09/04/2021)   fluticasone (FLONASE) 50 MCG/ACT nasal spray Place 2 sprays into both nostrils daily as needed for allergies.   furosemide (LASIX) 20 MG tablet TAKE 1 TABLET (20 MG TOTAL) BY MOUTH DAILY AS NEEDED (SHORTNESS OF BREATH).   HYDROcodone-acetaminophen (NORCO/VICODIN) 5-325 MG tablet Take 1-2 tablets by mouth every 4 (four) hours as needed for moderate pain (pain score 4-6). (Patient not taking: Reported on 09/04/2021)   methocarbamol (ROBAXIN) 500 MG tablet Take 1 tablet (500 mg total) by mouth every 6 (six) hours as needed for muscle spasms. (Patient not taking: Reported on 09/04/2021)   metoprolol succinate (TOPROL-XL) 50 MG 24 hr tablet Take by mouth.   OTEZLA 30 MG TABS Take 30 mg by mouth in the morning and at bedtime.   rosuvastatin (CRESTOR) 5 MG tablet TAKE 1 TABLET BY MOUTH EVERY DAY   traMADol (ULTRAM) 50 MG tablet Take 1 tablet (50 mg total) by mouth every 6 (six) hours as needed.   venlafaxine (EFFEXOR) 75 MG tablet TAKE 2 TABLETS (150 MG TOTAL) BY MOUTH DAILY.   No facility-administered encounter medications on file as of 01/15/2022.    Care Gaps: Shingrix Vaccine  COVID-19 Vaccine Star Rating Drugs: Rosuvastatin 5 mg last filled [facility]10[facility]  Medication Fill Gaps: Apixaban 5 MG tablet last filled 11/08/2020 60 day supply  Called patient and discussed medication adherence  with  patient, no issues at this time with current medication.   Patient daughter Denies ED visit since her last CPP follow up.  Patient daughter Denies  any side effects with her medication. Patient daughter Denies  any problems with hercurrent pharmacy  Patient daughter states patient was started on a new medication since the last time she spoken to the clinical pharmacist, but denies any issue at this time.  Patient daughter denies any concerns/questions for the clinical pharmacist, but would like to schedule a follow up soon with him.  Patient daughter schedule a telephone follow up with the clinical pharmacist on 02/04/2022 at 8:30 am.Patient daughter states to call her at [phone].  [place] Pharmacist Assistant [phone]

## 2022-01-20 NOTE — Telephone Encounter (Signed)
Pt called in stating she is still waiting for an update on the referral, and requested a call back to get some information, and if she cant be reached if a vm could be left, please advise.

## 2022-01-21 NOTE — Telephone Encounter (Signed)
Patient was called and advised that the order was paced on 01/14/22 for the diagnostic Mammogram.

## 2022-01-23 DIAGNOSIS — L570 Actinic keratosis: Secondary | ICD-10-CM | POA: Diagnosis not present

## 2022-01-23 DIAGNOSIS — D0471 Carcinoma in situ of skin of right lower limb, including hip: Secondary | ICD-10-CM | POA: Diagnosis not present

## 2022-01-24 ENCOUNTER — Other Ambulatory Visit: Payer: Self-pay | Admitting: Family Medicine

## 2022-01-24 DIAGNOSIS — J309 Allergic rhinitis, unspecified: Secondary | ICD-10-CM

## 2022-01-24 NOTE — Telephone Encounter (Signed)
Requested medications are due for refill today.  yes  Requested medications are on the active medications list.  yes  Last refill. 11/26/2020  Future visit scheduled.   no  Notes to clinic.  Medication not delegated    Requested Prescriptions  Pending Prescriptions Disp Refills   fluticasone (FLONASE) 50 MCG/ACT nasal spray [Pharmacy Med Name: FLUTICASONE PROP 50 MCG SPRAY] 48 mL 4    Sig: Place 2 sprays into both nostrils daily as needed for allergies.     Not Delegated - Ear, Nose, and Throat: Nasal Preparations - Corticosteroids Failed - 01/24/2022  1:52 AM      Failed - This refill cannot be delegated      Passed - Valid encounter within last 12 months    Recent Outpatient Visits           4 months ago Hyperglycemia   [facility] Jerrol Banana., MD   1 year ago Hyperglycemia   [facility] Jerrol Banana., MD   1 year ago Dog bite of right lower leg with infection, initial encounter   [facility] Fl[facility]um, Kelby Aline, FNP   1 year ago Encounter for annual physical exam   [facility] Jerrol Banana., MD   1 year ago Neuropathy   [facility] Jerrol Banana., MD

## 2022-01-29 ENCOUNTER — Other Ambulatory Visit: Payer: Self-pay | Admitting: *Deleted

## 2022-01-29 ENCOUNTER — Telehealth: Payer: Self-pay

## 2022-01-29 DIAGNOSIS — N63 Unspecified lump in unspecified breast: Secondary | ICD-10-CM

## 2022-01-29 NOTE — Telephone Encounter (Signed)
Order in epic. 

## 2022-01-29 NOTE — Telephone Encounter (Signed)
Copied from [place] [id number]. Topic: General - Other >> Jan 29, 2022 11:04 AM Parke Poisson wrote: Reason for CRM: Per York Cerise at [place] they will need an order for diagnostic bilateral mammogram TOMO [id number]. We also need to know clock position They also need left/right breast limited ultrasound [id number]  & [id number]

## 2022-02-03 ENCOUNTER — Telehealth: Payer: Self-pay

## 2022-02-03 NOTE — Progress Notes (Signed)
Chronic Care Management APPOINTMENT REMINDER   Called HUBERTA TOMPKINS, No answer, unable to leave a voice message to remind patient of appointment on 02/04/2022 at 8:30 am via telephone visit with [name] , Pharm D. Notified to have all medications, supplements, blood pressure and/or blood sugar logs available during appointment and to return call if need to reschedule.  [place] Pharmacist Assistant [phone]

## 2022-02-04 ENCOUNTER — Ambulatory Visit (INDEPENDENT_AMBULATORY_CARE_PROVIDER_SITE_OTHER): Payer: Medicare HMO

## 2022-02-04 DIAGNOSIS — E785 Hyperlipidemia, unspecified: Secondary | ICD-10-CM

## 2022-02-04 DIAGNOSIS — I4891 Unspecified atrial fibrillation: Secondary | ICD-10-CM

## 2022-02-04 NOTE — Progress Notes (Deleted)
Chronic Care Management Pharmacy Note  02/04/2022 Name:  Teresa [name] MRN:  [id number] DOB:  [birth year]  Summary: Today's visit was conducted entirely with patient's daughter, Teresa [name]. She reports her mother has been doing well, and is nervous regarding an upcoming surgery in 3-4 weeks. They had a bad experience at their neurologist, and were interested in a potential new neurology referral. She was also interested in trying to reduce her pill burden when possible.  Recommendations/Changes made from today's visit: Continue current medications  Plan: CPP follow-up in 2 months   Subjective: Teresa [name] is an 81 y.o. year old female who is a primary patient of Jerrol Banana., MD.  The CCM team was consulted for assistance with disease management and care coordination needs.    Engaged with patient by telephone for follow up visit in response to provider referral for pharmacy case management and/or care coordination services.   Consent to Services:  The patient was given information about Chronic Care Management services, agreed to services, and gave verbal consent prior to initiation of services.  Please see initial visit note for detailed documentation.   Patient Care Team: Jerrol Banana., MD as PCP - General (Family Medicine) Wellington Hampshire, MD as PCP - Cardiology (Cardiology) Dasher, Rayvon Char, MD as Consulting Physician (Dermatology) Blanche East, MD as Consulting Physician (Neurosurgery) Estevan Ryder, MD as Referring Physician (Cardiology) Troxler, Rodman Key, [place] (Inactive) as Attending Physician (Podiatry) Germaine Pomfret, [facility] as Pharmacist (Pharmacist) Quintin Alto, MD as Consulting Physician (Rheumatology) Diamond Nickel, DO as Consulting Physician (Sports Medicine) Marchia Meiers, MD as Consulting Physician (Ophthalmology)  Recent office visits: 09/04/21: patient presented to Dr. [name] for follow-up.   Recent consult  visits: 10/29/21: Patient presented to Lynann Bologna, PA (Cardiology) for follow-up.  10/25/21: Patient presented to Dr. Werner Lean (Neurology) for Alzheimer consuklt. Memantinestarted, titrate up to 10 mg twice daily.  05/31/21: Patient presented to Dr. Ron Parker (Cardiology) for follow-up. No medication chagnes made.  04/09/21: Patient presented to Dr. Posey Pronto (Rheumatology) for follow-up.  Hospital visits: None in previous 6 months   Objective:  Lab Results  Component Value Date   CREATININE 0.89 09/06/2021   BUN 9 09/06/2021   GFRNONAA >60 07/12/2021   GFRAA 69 02/16/2019   NA 145 (H) 09/06/2021   K 5.0 09/06/2021   CALCIUM 9.8 09/06/2021   CO2 23 09/06/2021   GLUCOSE 98 09/06/2021    Lab Results  Component Value Date/Time   HGBA1C 5.5 09/06/2021 10:02 AM   HGBA1C 5.8 (A) 11/26/2020 01:33 PM   HGBA1C 5.8 (H) 05/30/2020 03:18 PM    Last diabetic Eye exam: No results found for: HMDIABEYEEXA  Last diabetic Foot exam: No results found for: HMDIABFOOTEX   Lab Results  Component Value Date   CHOL 162 09/06/2021   HDL 71 09/06/2021   LDLCALC 79 09/06/2021   TRIG 59 09/06/2021   CHOLHDL 2.3 09/06/2021    Hepatic Function Latest Ref Rng & Units 09/06/2021 07/02/2021 02/16/2019  Total Protein 6.0 - 8.5 g/dL 6.5 6.7 6.7  Albumin 3.7 - 4.7 g/dL 4.3 3.8 4.3  AST 0 - 40 IU/L _0 ALT 0 - 32 IU/L _1 Alk Phosphatase 44 - 121 IU/L 98 64 103  Total Bilirubin 0.0 - 1.2 mg/dL 0.3 0.7 0.6  Bilirubin, Direct 0.1 - 0.5 mg/dL - - -    Lab Results  Component Value Date/Time   TSH 1.250 09/06/2021 10:02  AM   TSH 2.130 05/30/2020 03:18 PM   FREET4 0.93 01/13/2018 11:40 AM    CBC Latest Ref Rng & Units 09/06/2021 07/13/2021 07/12/2021  WBC 3.4 - 10.8 x10E3/uL 5.7 6.6 6.8  Hemoglobin 11.1 - 15.9 g/dL 13.7 10.2(L) 12.9  Hematocrit 34.0 - 46.6 % 41.7 31.8(L) 38.2  Platelets 150 - 450 x10E3/uL 270 173 225    No results found for: VD25OH  Clinical ASCVD: Yes  The ASCVD Risk score  (Arnett DK, et al., 2019) failed to calculate for the following reasons:   The 2019 ASCVD risk score is only valid for ages 79 to 9   The patient has a prior MI or stroke diagnosis    Depression screen [facility] 2/9 01/10/2021 01/10/2020 01/10/2020  Decreased Interest 0 0 0  Down, Depressed, Hopeless 0 0 0  PHQ - 2 Score 0 0 0  Altered sleeping - - -  Tired, decreased energy - - -  Change in appetite - - -  Feeling bad or failure about yourself  - - -  Trouble concentrating - - -  Moving slowly or fidgety/restless - - -  Suicidal thoughts - - -  PHQ-9 Score - - -  Difficult doing work/chores - - -  Some recent data might be hidden    Social History   Tobacco Use  Smoking Status Former   Packs/day: 0.50   Years: 35.00   Pack years: 17.50   Types: Cigarettes   Quit date: 1991   Years since quitting: 32.1  Smokeless Tobacco Never  Tobacco Comments   Quit approx 1990   BP Readings from Last 3 Encounters:  09/04/21 124/74  07/16/21 (!) 119/37  07/02/21 (!) 119/51   Pulse Readings from Last 3 Encounters:  09/04/21 78  07/16/21 66  07/02/21 60   Wt Readings from Last 3 Encounters:  09/04/21 171 lb (77.6 kg)  07/11/21 176 lb (79.8 kg)  07/02/21 175 lb (79.4 kg)   BMI Readings from Last 3 Encounters:  09/04/21 29.35 kg/m  07/11/21 30.21 kg/m  07/02/21 30.04 kg/m    Assessment/Interventions: Review of patient past medical history, allergies, medications, health status, including review of consultants reports, laboratory and other test data, was performed as part of comprehensive evaluation and provision of chronic care management services.   SDOH:  (Social Determinants of Health) assessments and interventions performed: Yes  SDOH Screenings   Alcohol Screen: Not on file  Depression (PHQ2-9): Not on file  Financial Resource Strain: Low Risk    Difficulty of Paying Living Expenses: Not hard at all  Food Insecurity: Not on file  Housing: Not on file  Physical Activity:  Not on file  Social Connections: Not on file  Stress: Not on file  Tobacco Use: Medium Risk   Smoking Tobacco Use: Former   Smokeless Tobacco Use: Never   Passive Exposure: Not on file  Transportation Needs: No Transportation Needs   Lack of Transportation (Medical): No   Lack of Transportation (Non-Medical): No    CCM Care Plan  Allergies  Allergen Reactions   Sulfa Antibiotics Hives   Ivp Dye [Iodinated Contrast Media] Swelling   Amiodarone Nausea And Vomiting and Cough   Codeine Other (See Comments)    ALTERED MENTAL STATUS    Medications Reviewed Today     Reviewed by Julieta Bellini, CMA (Certified Medical Assistant) on 09/04/21 at 1557  Med List Status: <None>   Medication Order Taking? Sig Documenting Provider Last Dose Status Informant  acetaminophen (  TYLENOL) 650 MG CR tablet [id number] Yes Take 650 mg by mouth every 8 (eight) hours as needed for pain. [provider] Taking Active Self  apixaban (ELIQUIS) 5 MG TABS tablet [id number] Yes Take 1 tablet (5 mg total) by mouth 2 (two) times daily. Wellington Hampshire, MD Taking Active Self  budesonide-formoterol [facility]) 160-4.5 MCG/ACT inhaler [id number] Yes Inhale 2 puffs into the lungs 2 (two) times daily. Jerrol Banana., MD Taking Active            Med Note Kellie Simmering, Jenene Slicker   Fri Apr 12, 2021 10:26 AM)    cetirizine (ZYRTEC) 10 MG tablet [id number] Yes Take 1 tablet (10 mg total) by mouth daily. Jerrol Banana., MD Taking Active Family Member           Med Note Kellie Simmering, Jenene Slicker   Fri Apr 12, 2021 10:26 AM)    docusate sodium (COLACE) 100 MG capsule [id number] No Take 1 capsule (100 mg total) by mouth 2 (two) times daily.  Patient not taking: Reported on 09/04/2021   Duanne Guess, PA-C Not Taking Active   donepezil (ARICEPT) 5 MG tablet [id number] Yes TAKE 1 TABLET BY MOUTH EVERYDAY AT BEDTIME Jerrol Banana., MD Taking Active   fluticasone [facility]) 50 MCG/ACT nasal spray [id number] Yes  Place 2 sprays into both nostrils daily as needed for allergies. Jerrol Banana., MD Taking Active Self  furosemide (LASIX) 20 MG tablet [id number] Yes TAKE 1 TABLET (20 MG TOTAL) BY MOUTH DAILY AS NEEDED (SHORTNESS OF BREATH). Minna Merritts, MD Taking Active Self           Med Note Ivor Reining Jun 27, 2021  1:42 PM)    gabapentin (NEURONTIN) 300 MG capsule [id number] Yes Take 2 capsules (600 mg total) by mouth 2 (two) times daily. Duanne Guess, PA-C Taking Active   HYDROcodone-acetaminophen (NORCO/VICODIN) 5-325 MG tablet [id number] No Take 1-2 tablets by mouth every 4 (four) hours as needed for moderate pain (pain score 4-6).  Patient not taking: Reported on 09/04/2021   Duanne Guess, PA-C Not Taking Active   methocarbamol (ROBAXIN) 500 MG tablet [id number] No Take 1 tablet (500 mg total) by mouth every 6 (six) hours as needed for muscle spasms.  Patient not taking: Reported on 09/04/2021   Duanne Guess, PA-C Not Taking Active   metoprolol succinate (TOPROL-XL) 50 MG 24 hr tablet [id number] Yes Take by mouth. [provider] Taking Active   OTEZLA 30 MG TABS [id number] Yes Take 30 mg by mouth in the morning and at bedtime. [provider] Taking Active Self  rosuvastatin (CRESTOR) 5 MG tablet [id number] Yes TAKE 1 TABLET BY MOUTH EVERY DAY Jerrol Banana., MD Taking Active   traMADol [facility]at[facility]) 50 MG tablet [id number] Yes Take 1 tablet (50 mg total) by mouth every 6 (six) hours as needed. Duanne Guess, PA-C Taking Active   venlafaxine [facility]) 75 MG tablet [id number] Yes TAKE 2 TABLETS (150 MG TOTAL) BY MOUTH DAILY. Jerrol Banana., MD Taking Active             Patient Active Problem List   Diagnosis Date Noted   Status post total hip replacement, right 07/11/2021   Need for prophylactic vaccination against rabies 10/11/2020   Puncture wound 10/11/2020   Need for Tdap vaccination 10/11/2020   Dog bite of right lower leg with  infection 10/11/2020   Neuropathy 05/02/2020  Chronic obstructive pulmonary disease (HCC) 03/11/2019   Atrial fibrillation ([place]) 11/11/2018   A-fib ([place]) 10/12/2018   Rheumatoid arthritis ([place]) 10/10/2018   Atrial fibrillation with RVR ([place]) 10/10/2018   Psoriatic arthritis ([place]) 03/04/2018   Unstable angina (HCC)    Atrial fibrillation with rapid ventricular response ([place]) 01/13/2018   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   Asthma 05/09/2017   Primary osteoarthritis of both first carpometacarpal joints 01/31/2016   Arthralgia of both hands 01/31/2016   Ventricular premature depolarization 09/11/2015   Psoriasis 07/16/2015   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   Hyperlipidemia 03/29/2015   Pre-syncope 03/04/2015   CAD (coronary artery disease)    PVC's (premature ventricular contractions)    Ischemic cardiomyopathy    DDD (degenerative disc disease), lumbar 01/18/2015   Intervertebral disc disorder with radiculopathy of lumbar region 12/26/2014   L-S radiculopathy 07/03/2014   Breast CA ([place]) 06/28/2014   Arthritis, degenerative 06/28/2014   Acid reflux 03/08/2014   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    Immunization History  Administered Date(s) Administered   Fluad Quad(high Dose 65+) 09/12/2020, 09/04/2021   Influenza Whole 08/22/2018   Influenza, High Dose Seasonal PF 10/04/2015, 10/06/2016, 09/16/2017, 08/31/2018, 10/07/2019   Influenza-Unspecified 07/22/2014   Moderna Sars-Covid-2 Vaccination 02/02/2020, 03/01/2020, 11/09/2020   Pneumococcal Conjugate-13 10/06/2016   Pneumococcal Polysaccharide-23 11/13/2009   Tdap 10/11/2020    Conditions to be addressed/monitored:  Hypertension, Atrial Fibrillation, Heart Failure, Coronary Artery Disease, GERD, COPD, Asthma, Osteoarthritis, and Psoriatic Arthritis and Insomnia  There are no care plans that you recently modified to display for this patient.    Medication Assistance: None required.   Patient affirms current coverage meets needs.  Compliance/Adherence/Medication fill history: Care Gaps: Shingrix  Star-Rating Drugs: Rosuvastatin 5 mg last filled on 05/19/2021 [facility]  [facility]  [id number] [address]Phone: [phone]: [phone] [id number] [place] [address]one: [phone]: [id number] [place] [place]one: [phone]: [phone] pill box? Yes Pt endorses 100% compliance  We discussed: Current pharmacy is preferred with insurance plan and patient is satisfied with pharmacy services Patient decided to: Continue current medication management strategy  Care Plan and Follow Up Patient Decision:  Patient agrees to Care Plan and Follow-up.  Plan: Telephone follow up appointment with care management team member scheduled for:  07/31/2021 at 12:30 PM   AleJunius ArgyleharmD, [phone] Barriers:  No barriers noted  Pharmacist Clinical Goal(s):  Patient will maintain control of heart failure as evidenced by stable weight; no heart failure exacerbations  through collaboration with PharmD and provider.   Interventions: 1:1 collaboration with GilJerrol BananaMD regarding development and update of comprehensive plan of care as evidenced by provider attestation and co-signature Inter-disciplinary care team collaboration (see longitudinal plan of care) Comprehensive medication review performed; medication list updated in electronic medical record  Atrial Fibrillation (Goal: prevent stroke and major bleeding) -Controlled -Current treatment: Rate control: Metoprolol XL 50 mg 1/2 tablet twice daily   Anticoagulation: Eliquis 5 mg twice daily  -Medications previously tried: NA -Counseled on avoidance of NSAIDs due to increased bleeding risk with anticoagulants; -Recommended to continue current medication  Heart Failure (Goal:  manage symptoms and prevent exacerbations) -Controlled -Last ejection fraction: 12% -HF type: Systolic -NYHA Class: II (slight limitation of activity) -AHA HF Stage: C (Heart disease and symptoms present) -Current treatment: Furosemide 20 mg daily as needed  Metoprolol XL 50 mg 1/2 tablet twice daily  -Medications previously tried: Ship broker  -Educated on Importance of weighing daily; if you gain more than 3 pounds in one day or 5 pounds in one week, contact provider  -Recommended to continue current medication  Hyperlipidemia: (LDL goal < ***) -{US controlled/uncontrolled:25276} -Current treatment: Rosuvastatin 5 mg daily  -Medications previously tried: ***  -Current dietary patterns: *** -Current exercise habits: *** -Educated on {CCM HLD Counseling:25126} -{CCMPHARMDINTERVENTION:25122}   Asthma (Goal: control symptoms and prevent exacerbations) -Controlled -Current treatment  Symbicort 2 puffs twice daily  Montelukast 10 mg daily as needed  -Current allergy treatment  Cetirizine 10 mg daily as needed  Flonase 50 mcg/act 2 sprays daily as needed  -Medications previously tried: NA  -Asthma allergy driven, seaosonal symptoms improved -Exacerbations requiring treatment in last 6 months: None -Patient reports consistent use of maintenance inhaler -Frequency of rescue inhaler use: Sporadic -Counseled on Benefits of consistent maintenance inhaler use -Recommended to continue current medication  Alzheimer's Disease (Goal: ***) -{US controlled/uncontrolled:25276} -Current treatment  Donepezil 5 mg nightly  Memantine  -Medications previously tried: ***  -{CCMPHARMDINTERVENTION:25122}   Patient Goals/Self-Care Activities Patient will:  - check  blood pressure 2-3 times weekly, document, and provide at future appointments weigh daily, and contact provider if weight gain of greater than 2 pounds  Follow Up Plan: Telephone follow up appointment with care management team member scheduled for:  07/31/2021 at 12:30 PM

## 2022-02-04 NOTE — Patient Instructions (Addendum)
Visit Information It was great speaking with you today!  Please let me know if you have any questions about our visit.   Goals Addressed             This Visit's Progress    Track and Manage Fluids and Swelling-Heart Failure   On track    Timeframe:  Long-Range Goal Priority:  High Start Date:  02/11/2021                           Expected End Date: 08/11/2021                      Follow Up Date 07/31/2021    - call office if I gain more than 2 pounds in one day or 5 pounds in one week    Why is this important?   It is important to check your weight daily and watch how much salt and liquids you have.  It will help you to manage your heart failure.    Notes:         Patient Care Plan: General Pharmacy (Adult)     Problem Identified: Hyperlipidemia, Atrial Fibrillation, Heart Failure, Coronary Artery Disease, GERD, COPD, Asthma, Osteoarthritis and Psoriatic Arthritis and Insomnia   Priority: High     Long-Range Goal: Patient-Specific Goal   Start Date: 02/11/2021  Expected End Date: 02/04/2023  This Visit's Progress: On track  Recent Progress: On track  Priority: High  Note:   Current Barriers:  No barriers noted  Pharmacist Clinical Goal(s):  Patient will maintain control of heart failure as evidenced by stable weight; no heart failure exacerbations  through collaboration with PharmD and provider.   Interventions: 1:1 collaboration with Jerrol Banana., MD regarding development and update of comprehensive plan of care as evidenced by provider attestation and co-signature Inter-disciplinary care team collaboration (see longitudinal plan of care) Comprehensive medication review performed; medication list updated in electronic medical record  Atrial Fibrillation (Goal: prevent stroke and major bleeding) -Controlled -Current treatment: Rate control: Metoprolol XL 50 mg 1/2 tablet twice daily  Anticoagulation: Eliquis 5 mg twice daily  -Medications previously  tried: NA -Counseled on avoidance of NSAIDs due to increased bleeding risk with anticoagulants; -Recommended to continue current medication  Heart Failure (Goal: manage symptoms and prevent exacerbations) -Controlled -Last ejection fraction: 10% -HF type: Systolic -NYHA Class: II (slight limitation of activity) -AHA HF Stage: C (Heart disease and symptoms present) -Current treatment: Entresto 24-26 mg twice daily: Appropriate, Effective, Safe, Accessible Furosemide 20 mg daily as needed: Appropriate, Effective, Safe, Accessible  Metoprolol XL 50 mg 1/2 tablet twice daily: Appropriate, Effective, Safe, Accessible  -Medications previously tried: NA -Entresto was stopped after surgery due to hypotension, Helene Kelp reports she was instructed to have the patient resume taking this medication. Denies further instances of hypotension in that time.  -Recommended to continue current medication  Asthma (Goal: control symptoms and prevent exacerbations) -Controlled -Current treatment  Symbicort 2 puffs twice daily  Montelukast 10 mg daily as needed  -Current allergy treatment  Cetirizine 10 mg daily as needed  Flonase 50 mcg/act 2 sprays daily as needed  -Medications previously tried: NA  -Asthma allergy driven, seaosonal symptoms improved -Exacerbations requiring treatment in last 6 months: None -Patient reports consistent use of maintenance inhaler -Frequency of rescue inhaler use: Sporadic -Counseled on Benefits of consistent maintenance inhaler use -Recommended to continue current medication  Alzheimer's Disease (Goal: prevent worsening memory) -  Not ideally controlled -managed by Dr. Werner Lean -Current treatment  Donepezil 5 mg nightly: Appropriate, Effective, Safe, Accessible Memantine 10 mg twice daily: Appropriate, Effective, Safe, Accessible  -Medications previously tried: NA  -Patient outbursts worsened over the holiday. Helene Kelp reports frustration with her mother and not knowing if  she is telling the truth or not when they speak. She also feels there was an "incident" at [facility] where someone was telling her mother information that they should not have which was very upsetting. Encouraged her to speak with practice manager if she would like.   -Does endorse some mild nausea since starting Memantine. -Felt significant improvement after donepezil, and feels more clarity with Namenda  -Recommended to continue current medication    Patient Goals/Self-Care Activities Patient will:  - check blood pressure 2-3 times weekly, document, and provide at future appointments weigh daily, and contact provider if weight gain of greater than 2 pounds  Follow Up Plan: Telephone follow up appointment with care management team member scheduled for:  07/31/2021 at 12:30 PM     Patient agreed to services and verbal consent obtained.   Patient verbalizes understanding of instructions and care plan provided today and agrees to view in [place]. Active MyChart status confirmed with patient.    [name], PharmD, Para [place], CPP  Clinical Pharmacist Practitioner  [facility] [phone]

## 2022-02-04 NOTE — Progress Notes (Signed)
Chronic Care Management Pharmacy Note  02/04/2022 Name:  Teresa [name] MRN:  [id number] DOB:  1941/10/12  Summary: Patient presents for CCM follow-up. Today's visit was conducted entirely with patient's daughter, Teresa [name]. She was started on Namenda and resumed Entresto.   Recommendations/Changes made from today's visit: Continue current medications  Plan: CPP follow-up in 3 months   Recommended Problem List Changes:  Add:  Alzheimer disease  Chronic systolic heart failure  Subjective: Teresa [name] is an 81 y.o. year old female who is a primary patient of Jerrol Banana., MD.  The CCM team was consulted for assistance with disease management and care coordination needs.    Engaged with patient by telephone for follow up visit in response to provider referral for pharmacy case management and/or care coordination services.   Consent to Services:  The patient was given information about Chronic Care Management services, agreed to services, and gave verbal consent prior to initiation of services.  Please see initial visit note for detailed documentation.   Patient Care Team: Jerrol Banana., MD as PCP - General (Family Medicine) Wellington Hampshire, MD as PCP - Cardiology (Cardiology) Dasher, Rayvon Char, MD as Consulting Physician (Dermatology) Blanche East, MD as Consulting Physician (Neurosurgery) Estevan Ryder, MD as Referring Physician (Cardiology) Troxler, Rodman [place], [place] (Inactive) as Attending Physician (Podiatry) Germaine Pomfret, [facility] as Pharmacist (Pharmacist) Quintin Alto, MD as Consulting Physician (Rheumatology) Diamond Nickel, DO as Consulting Physician (Sports Medicine) Marchia Meiers, MD as Consulting Physician (Ophthalmology)  Recent office visits: 09/04/21: patient presented to Dr. [name] for follow-up.   Recent consult visits: 10/29/21: Patient presented to Lynann Bologna, PA (Cardiology) for follow-up.  10/25/21: Patient  presented to Dr. Werner Lean (Neurology) for Alzheimer consuklt. Memantinestarted, titrate up to 10 mg twice daily.  05/31/21: Patient presented to Dr. Ron Parker (Cardiology) for follow-up. No medication chagnes made.  04/09/21: Patient presented to Dr. Posey Pronto (Rheumatology) for follow-up.  Hospital visits: None in previous 6 months   Objective:  Lab Results  Component Value Date   CREATININE 0.89 09/06/2021   BUN 9 09/06/2021   GFRNONAA >60 07/12/2021   GFRAA 69 02/16/2019   NA 145 (H) 09/06/2021   K 5.0 09/06/2021   CALCIUM 9.8 09/06/2021   CO2 23 09/06/2021   GLUCOSE 98 09/06/2021    Lab Results  Component Value Date/Time   HGBA1C 5.5 09/06/2021 10:02 AM   HGBA1C 5.8 (A) 11/26/2020 01:33 PM   HGBA1C 5.8 (H) 05/30/2020 03:18 PM    Last diabetic Eye exam: No results found for: HMDIABEYEEXA  Last diabetic Foot exam: No results found for: HMDIABFOOTEX   Lab Results  Component Value Date   CHOL 162 09/06/2021   HDL 71 09/06/2021   LDLCALC 79 09/06/2021   TRIG 59 09/06/2021   CHOLHDL 2.3 09/06/2021    Hepatic Function Latest Ref Rng & Units 09/06/2021 07/02/2021 02/16/2019  Total Protein 6.0 - 8.5 g/dL 6.5 6.7 6.7  Albumin 3.7 - 4.7 g/dL 4.3 3.8 4.3  AST 0 - 40 IU/L _0 ALT 0 - 32 IU/L _1 Alk Phosphatase 44 - 121 IU/L 98 64 103  Total Bilirubin 0.0 - 1.2 mg/dL 0.3 0.7 0.6  Bilirubin, Direct 0.1 - 0.5 mg/dL - - -    Lab Results  Component Value Date/Time   TSH 1.250 09/06/2021 10:02 AM   TSH 2.130 05/30/2020 03:18 PM   FREET4 0.93 01/13/2018 11:40 AM    CBC  Latest Ref Rng & Units 09/06/2021 07/13/2021 07/12/2021  WBC 3.4 - 10.8 x10E3/uL 5.7 6.6 6.8  Hemoglobin 11.1 - 15.9 g/dL 13.7 10.2(L) 12.9  Hematocrit 34.0 - 46.6 % 41.7 31.8(L) 38.2  Platelets 150 - 450 x10E3/uL 270 173 225    No results found for: VD25OH  Clinical ASCVD: Yes  The ASCVD Risk score (Arnett DK, et al., 2019) failed to calculate for the following reasons:   The 2019 ASCVD risk score is  only valid for ages 89 to 22   The patient has a prior MI or stroke diagnosis    Depression screen [facility] 2/9 01/10/2021 01/10/2020 01/10/2020  Decreased Interest 0 0 0  Down, Depressed, Hopeless 0 0 0  PHQ - 2 Score 0 0 0  Altered sleeping - - -  Tired, decreased energy - - -  Change in appetite - - -  Feeling bad or failure about yourself  - - -  Trouble concentrating - - -  Moving slowly or fidgety/restless - - -  Suicidal thoughts - - -  PHQ-9 Score - - -  Difficult doing work/chores - - -  Some recent data might be hidden    Social History   Tobacco Use  Smoking Status Former   Packs/day: 0.50   Years: 35.00   Pack years: 17.50   Types: Cigarettes   Quit date: 1991   Years since quitting: 32.1  Smokeless Tobacco Never  Tobacco Comments   Quit approx 1990   BP Readings from Last 3 Encounters:  09/04/21 124/74  07/16/21 (!) 119/37  07/02/21 (!) 119/51   Pulse Readings from Last 3 Encounters:  09/04/21 78  07/16/21 66  07/02/21 60   Wt Readings from Last 3 Encounters:  09/04/21 171 lb (77.6 kg)  07/11/21 176 lb (79.8 kg)  07/02/21 175 lb (79.4 kg)   BMI Readings from Last 3 Encounters:  09/04/21 29.35 kg/m  07/11/21 30.21 kg/m  07/02/21 30.04 kg/m    Assessment/Interventions: Review of patient past medical history, allergies, medications, health status, including review of consultants reports, laboratory and other test data, was performed as part of comprehensive evaluation and provision of chronic care management services.   SDOH:  (Social Determinants of Health) assessments and interventions performed: Yes  SDOH Screenings   Alcohol Screen: Not on file  Depression (PHQ2-9): Not on file  Financial Resource Strain: Low Risk    Difficulty of Paying Living Expenses: Not hard at all  Food Insecurity: Not on file  Housing: Not on file  Physical Activity: Not on file  Social Connections: Not on file  Stress: Not on file  Tobacco Use: Medium Risk    Smoking Tobacco Use: Former   Smokeless Tobacco Use: Never   Passive Exposure: Not on file  Transportation Needs: No Transportation Needs   Lack of Transportation (Medical): No   Lack of Transportation (Non-Medical): No    CCM Care Plan  Allergies  Allergen Reactions   Sulfa Antibiotics Hives   Ivp Dye [Iodinated Contrast Media] Swelling   Amiodarone Nausea And Vomiting and Cough   Codeine Other (See Comments)    ALTERED MENTAL STATUS    Medications Reviewed Today     Reviewed by Julieta Bellini, CMA (Certified Medical Assistant) on 09/04/21 at 1557  Med List Status: <None>   Medication Order Taking? Sig Documenting Provider Last Dose Status Informant  acetaminophen (TYLENOL) 650 MG CR tablet [id number] Yes Take 650 mg by mouth every 8 (eight) hours as needed for  pain. [provider] Taking Active Self  apixaban (ELIQUIS) 5 MG TABS tablet [id number] Yes Take 1 tablet (5 mg total) by mouth 2 (two) times daily. Wellington Hampshire, MD Taking Active Self  budesonide-formoterol [facility]) 160-4.5 MCG/ACT inhaler [id number] Yes Inhale 2 puffs into the lungs 2 (two) times daily. Jerrol Banana., MD Taking Active            Med Note Kellie Simmering, Jenene Slicker   Fri Apr 12, 2021 10:26 AM)    cetirizine (ZYRTEC) 10 MG tablet [id number] Yes Take 1 tablet (10 mg total) by mouth daily. Jerrol Banana., MD Taking Active Family Member           Med Note Kellie Simmering, Jenene Slicker   Fri Apr 12, 2021 10:26 AM)    docusate sodium (COLACE) 100 MG capsule [id number] No Take 1 capsule (100 mg total) by mouth 2 (two) times daily.  Patient not taking: Reported on 09/04/2021   Duanne Guess, PA-C Not Taking Active   donepezil (ARICEPT) 5 MG tablet [id number] Yes TAKE 1 TABLET BY MOUTH EVERYDAY AT BEDTIME Jerrol Banana., MD Taking Active   fluticasone [facility]) 50 MCG/ACT nasal spray [id number] Yes Place 2 sprays into both nostrils daily as needed for allergies. Jerrol Banana., MD Taking  Active Self  furosemide (LASIX) 20 MG tablet [id number] Yes TAKE 1 TABLET (20 MG TOTAL) BY MOUTH DAILY AS NEEDED (SHORTNESS OF BREATH). Minna Merritts, MD Taking Active Self           Med Note Ivor Reining Jun 27, 2021  1:42 PM)    gabapentin (NEURONTIN) 300 MG capsule [id number] Yes Take 2 capsules (600 mg total) by mouth 2 (two) times daily. Duanne Guess, PA-C Taking Active   HYDROcodone-acetaminophen (NORCO/VICODIN) 5-325 MG tablet [id number] No Take 1-2 tablets by mouth every 4 (four) hours as needed for moderate pain (pain score 4-6).  Patient not taking: Reported on 09/04/2021   Duanne Guess, PA-C Not Taking Active   methocarbamol (ROBAXIN) 500 MG tablet [id number] No Take 1 tablet (500 mg total) by mouth every 6 (six) hours as needed for muscle spasms.  Patient not taking: Reported on 09/04/2021   Duanne Guess, PA-C Not Taking Active   metoprolol succinate (TOPROL-XL) 50 MG 24 hr tablet [id number] Yes Take by mouth. [provider] Taking Active   OTEZLA 30 MG TABS [id number] Yes Take 30 mg by mouth in the morning and at bedtime. [provider] Taking Active Self  rosuvastatin (CRESTOR) 5 MG tablet [id number] Yes TAKE 1 TABLET BY MOUTH EVERY DAY Jerrol Banana., MD Taking Active   traMADol [facility]) 50 MG tablet [id number] Yes Take 1 tablet (50 mg total) by mouth every 6 (six) hours as needed. Duanne Guess, PA-C Taking Active   venlafaxine [facility]) 75 MG tablet [id number] Yes TAKE 2 TABLETS (150 MG TOTAL) BY MOUTH DAILY. Jerrol Banana., MD Taking Active             Patient Active Problem List   Diagnosis Date Noted   Status post total hip replacement, right 07/11/2021   Need for prophylactic vaccination against rabies 10/11/2020   Puncture wound 10/11/2020   Need for Tdap vaccination 10/11/2020   Dog bite of right lower leg with infection 10/11/2020   Neuropathy 05/02/2020   Chronic obstructive pulmonary disease ([place])  03/11/2019   Atrial fibrillation ([place]) 11/11/2018   A-fib ([place]) 10/12/2018  Rheumatoid arthritis ([place]) 10/10/2018   Atrial fibrillation with RVR ([place]) 10/10/2018   Psoriatic arthritis ([place]) 03/04/2018   Unstable angina (H[facility])    Atrial fibrillation with rapid ventricular response ([place]) 01/13/2018   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   Asthma 05/09/2017   Primary osteoarthritis of both first carpometacarpal joints 01/31/2016   Arthralgia of both hands 01/31/2016   Ventricular premature depolarization 09/11/2015   Psoriasis 07/16/2015   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   Hyperlipidemia 03/29/2015   Pre-syncope 03/04/2015   CAD (coronary artery disease)    PVC's (premature ventricular contractions)    Ischemic cardiomyopathy    DDD (degenerative disc disease), lumbar 01/18/2015   Intervertebral disc disorder with radiculopathy of lumbar region 12/26/2014   L-S radiculopathy 07/03/2014   Breast CA ([place]) 06/28/2014   Arthritis, degenerative 06/28/2014   Acid reflux 03/08/2014   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    Immunization History  Administered Date(s) Administered   Fluad Quad(high Dose 65+) 09/12/2020, 09/04/2021   Influenza Whole 08/22/2018   Influenza, High Dose Seasonal PF 10/04/2015, 10/06/2016, 09/16/2017, 08/31/2018, 10/07/2019   Influenza-Unspecified 07/22/2014   Moderna Sars-Covid-2 Vaccination 02/02/2020, 03/01/2020, 11/09/2020   Pneumococcal Conjugate-13 10/06/2016   Pneumococcal Polysaccharide-23 11/13/2009   Tdap 10/11/2020    Conditions to be addressed/monitored:  Hypertension, Atrial Fibrillation, Heart Failure, Coronary Artery Disease, GERD, COPD, Asthma, Osteoarthritis, and Psoriatic Arthritis and Insomnia  There are no care plans that you recently modified to display for this patient.    Medication Assistance: None required.  Patient affirms current coverage meets needs.  Compliance/Adherence/Medication fill  history: Care Gaps: Shingrix  Star-Rating Drugs: Rosuvastatin 5 mg last filled on 05/19/2021 for 90 day supply at [facility].  Patient's preferred pharmacy [facility]  [id number] [address] [place] [place]: [phone]: [phone] [id number] [place] [address]: [phone]: [id number] [address] [place]one: [phone]: [phone] pill box? Yes Pt endorses 100% compliance  We discussed: Current pharmacy is preferred with insurance plan and patient is satisfied with pharmacy services Patient decided to: Continue current medication management strategy  Care Plan and Follow Up Patient Decision:  Patient agrees to Care Plan and Follow-up.  Plan: Telephone follow up appointment with care management team member scheduled for:  07/31/2021 at 12:30 PM   AleJunius ArgyleharmD, [id number]

## 2022-02-18 DIAGNOSIS — E785 Hyperlipidemia, unspecified: Secondary | ICD-10-CM

## 2022-02-18 DIAGNOSIS — I4891 Unspecified atrial fibrillation: Secondary | ICD-10-CM

## 2022-03-13 ENCOUNTER — Telehealth: Payer: Self-pay

## 2022-03-13 NOTE — Progress Notes (Signed)
? ? ?  Chronic Care Management ?Pharmacy Assistant  ? ?Name: Teresa [name]  MRN: [id number] DOB: 09-07-1941 ? ?Reason for Encounter: Medication Review/General Adherence Call. ?  ?Recent office visits:  ?No recent office visit ? ?Recent consult visits:  ?No recent consult visit ? ?Hospital visits:  ?None in previous 6 months ? ?Medications: ?Outpatient Encounter Medications as of 03/13/2022  ?Medication Sig  ? donepezil (ARICEPT) 5 MG tablet TAKE 1 TABLET BY MOUTH EVERYDAY AT BEDTIME  ? acetaminophen (TYLENOL) 650 MG CR tablet Take 650 mg by mouth every 8 (eight) hours as needed for pain.  ? apixaban (ELIQUIS) 5 MG TABS tablet Take 1 tablet (5 mg total) by mouth 2 (two) times daily.  ? budesonide-formoterol (SYMBICORT) 160-4.5 MCG/ACT inhaler Inhale 2 puffs into the lungs 2 (two) times daily.  ? cetirizine (ZYRTEC) 10 MG tablet TAKE 1 TABLET BY MOUTH EVERY DAY  ? docusate sodium (COLACE) 100 MG capsule Take 1 capsule (100 mg total) by mouth 2 (two) times daily. (Patient not taking: Reported on 09/04/2021)  ? fluticasone (FLONASE) 50 MCG/ACT nasal spray PLACE 2 SPRAYS INTO BOTH NOSTRILS DAILY AS NEEDED FOR ALLERGIES.  ? furosemide (LASIX) 20 MG tablet TAKE 1 TABLET (20 MG TOTAL) BY MOUTH DAILY AS NEEDED (SHORTNESS OF BREATH).  ? gabapentin (NEURONTIN) 300 MG capsule TAKE TWO TABS BY MOUTH IN THE AM, TWO TABLETS BY MOUTH AT BEDTIME, AND 1 TABLET AT DINNER AS NEEDED. (Patient taking differently: 300 mg. 1 capsule in the morning, 2 capsules in the evening)  ? HYDROcodone-acetaminophen (NORCO/VICODIN) 5-325 MG tablet Take 1-2 tablets by mouth every 4 (four) hours as needed for moderate pain (pain score 4-6). (Patient not taking: Reported on 09/04/2021)  ? memantine (NAMENDA) 10 MG tablet Take 10 mg by mouth in the morning and at bedtime.  ? methocarbamol (ROBAXIN) 500 MG tablet Take 1 tablet (500 mg total) by mouth every 6 (six) hours as needed for muscle spasms. (Patient not taking: Reported on 09/04/2021)  ? metoprolol  succinate (TOPROL-XL) 50 MG 24 hr tablet Take by mouth.  ? OTEZLA 30 MG TABS Take 30 mg by mouth in the morning and at bedtime.  ? rosuvastatin (CRESTOR) 5 MG tablet TAKE 1 TABLET BY MOUTH EVERY DAY  ? sacubitril-valsartan (ENTRESTO) 24-26 MG Take 1 tablet by mouth 2 (two) times daily.  ? traMADol (ULTRAM) 50 MG tablet Take 1 tablet (50 mg total) by mouth every 6 (six) hours as needed.  ? venlafaxine (EFFEXOR) 75 MG tablet TAKE 2 TABLETS (150 MG TOTAL) BY MOUTH DAILY.  ? ?No facility-administered encounter medications on file as of 03/13/2022.  ? ? ?Care Gaps: ?Shingrix Vaccine ?COVID-19 Vaccine ? ?Star Rating Drugs: ?Rosuvastatin 5 mg last filled on 11/10/20[facility] ?  ?Medication Fill Gaps: ?Apixaban 5 MG tablet last filled 11/08/2020 60 day supply ? ?Called patient and discussed medication adherence  with patient, no issues at this time with current medication. ? ? Patient Denies ED visit since her last CPP follow up.  ?Patient Denies  any side effects with her medication. ?Patient Denies  any problems with her current pharmacy ? ?Telephone follow up appointment with Care management team member scheduled for : 04/22/2022 at 3:00 pm. ? ?Anderson Malta ?Clinical Pharmacist Assistant ?[phone]  ? ?

## 2022-03-25 ENCOUNTER — Other Ambulatory Visit: Payer: Self-pay | Admitting: Family Medicine

## 2022-03-25 DIAGNOSIS — N63 Unspecified lump in unspecified breast: Secondary | ICD-10-CM

## 2022-03-25 DIAGNOSIS — Z1231 Encounter for screening mammogram for malignant neoplasm of breast: Secondary | ICD-10-CM

## 2022-03-26 NOTE — Telephone Encounter (Signed)
I still need clock position of breast mass,Thanks ?

## 2022-03-26 NOTE — Telephone Encounter (Signed)
LMOVM for pt to return call 

## 2022-04-01 NOTE — Telephone Encounter (Signed)
Knot is on left breast at 9:00. ?

## 2022-04-11 ENCOUNTER — Other Ambulatory Visit: Payer: Medicare HMO

## 2022-04-18 ENCOUNTER — Ambulatory Visit
Admission: RE | Admit: 2022-04-18 | Discharge: 2022-04-18 | Disposition: A | Discharge status: Home / Self Care | Payer: Medicare HMO | Source: Ambulatory Visit | Attending: Family Medicine | Admitting: Family Medicine

## 2022-04-18 DIAGNOSIS — N63 Unspecified lump in unspecified breast: Secondary | ICD-10-CM | POA: Diagnosis not present

## 2022-04-18 DIAGNOSIS — R928 Other abnormal and inconclusive findings on diagnostic imaging of breast: Secondary | ICD-10-CM | POA: Diagnosis not present

## 2022-04-18 DIAGNOSIS — Z9889 Other specified postprocedural states: Secondary | ICD-10-CM | POA: Insufficient documentation

## 2022-04-18 DIAGNOSIS — N6321 Unspecified lump in the left breast, upper outer quadrant: Secondary | ICD-10-CM | POA: Diagnosis not present

## 2022-04-18 DIAGNOSIS — Z923 Personal history of irradiation: Secondary | ICD-10-CM | POA: Insufficient documentation

## 2022-04-18 DIAGNOSIS — Z1231 Encounter for screening mammogram for malignant neoplasm of breast: Secondary | ICD-10-CM | POA: Diagnosis not present

## 2022-04-18 DIAGNOSIS — N6489 Other specified disorders of breast: Secondary | ICD-10-CM | POA: Diagnosis not present

## 2022-04-18 DIAGNOSIS — Z853 Personal history of malignant neoplasm of breast: Secondary | ICD-10-CM | POA: Insufficient documentation

## 2022-04-21 ENCOUNTER — Telehealth: Payer: Self-pay

## 2022-04-21 NOTE — Progress Notes (Signed)
Chronic Care Management  APPOINTMENT REMINDER ? ? ?Called TERINA MCELHINNY, No answer, unable to leave a voice message of appointment on 04/22/2022 at 3:00 pm via telephone visit with [name] , Pharm D.  ? ?Anderson Malta ?Clinical Pharmacist Assistant ?[phone]  ? ? ?

## 2022-04-22 ENCOUNTER — Ambulatory Visit (INDEPENDENT_AMBULATORY_CARE_PROVIDER_SITE_OTHER): Payer: Medicare HMO

## 2022-04-22 DIAGNOSIS — E785 Hyperlipidemia, unspecified: Secondary | ICD-10-CM

## 2022-04-22 DIAGNOSIS — G3184 Mild cognitive impairment, so stated: Secondary | ICD-10-CM

## 2022-04-22 NOTE — Progress Notes (Signed)
Chronic Care Management Pharmacy Note  04/24/2022 Name:  Teresa [name] MRN:  [id number] DOB:  August 09, 1941  Summary: Patient presents for CCM follow-up. Today's visit was conducted entirely with patient's daughter, Dillon Mcreynolds.   Recommendations/Changes made from today's visit: Recommend increasing memantine to 10 mg twice daily and could consider starting donepezil 5 mg daily.   Plan: CPP follow-up in 3 months   Recommended Problem List Changes:  Add:  Alzheimer disease  Chronic systolic heart failure  Subjective: Teresa [name] is an 81 y.o. year old female who is a primary patient of Jerrol Banana., MD.  The CCM team was consulted for assistance with disease management and care coordination needs.    Engaged with patient by telephone for follow up visit in response to provider referral for pharmacy case management and/or care coordination services.   Consent to Services:  The patient was given information about Chronic Care Management services, agreed to services, and gave verbal consent prior to initiation of services.  Please see initial visit note for detailed documentation.   Patient Care Team: Jerrol Banana., MD as PCP - General (Family Medicine) Wellington Hampshire, MD as PCP - Cardiology (Cardiology) Dasher, Rayvon Char, MD as Consulting Physician (Dermatology) Blanche East, MD as Consulting Physician (Neurosurgery) Estevan Ryder, MD as Referring Physician (Cardiology) Troxler, [place]man [place], [place] (Inactive) as Attending Physician (Podiatry) Germaine Pomfret, [facility] as Pharmacist (Pharmacist) Quintin Alto, MD as Consulting Physician (Rheumatology) Diamond Nickel, DO as Consulting Physician (Sports Medicine) Marchia Meiers, MD as Consulting Physician (Ophthalmology)  Recent office visits: 09/04/21: patient presented to Dr. [name] for follow-up.   Recent consult visits: 10/29/21: Patient presented to Lynann Bologna, PA (Cardiology) for follow-up.   10/25/21: Patient presented to Dr. Werner Lean (Neurology) for Alzheimer consuklt. Memantinestarted, titrate up to 10 mg twice daily.  05/31/21: Patient presented to Dr. Ron Parker (Cardiology) for follow-up. No medication chagnes made.  04/09/21: Patient presented to Dr. Posey Pronto (Rheumatology) for follow-up.  Hospital visits: None in previous 6 months   Objective:  Lab Results  Component Value Date   CREATININE 0.89 09/06/2021   BUN 9 09/06/2021   GFRNONAA >60 07/12/2021   GFRAA 69 02/16/2019   NA 145 (H) 09/06/2021   K 5.0 09/06/2021   CALCIUM 9.8 09/06/2021   CO2 23 09/06/2021   GLUCOSE 98 09/06/2021    Lab Results  Component Value Date/Time   HGBA1C 5.5 09/06/2021 10:02 AM   HGBA1C 5.8 (A) 11/26/2020 01:33 PM   HGBA1C 5.8 (H) 05/30/2020 03:18 PM    Last diabetic Eye exam: No results found for: HMDIABEYEEXA  Last diabetic Foot exam: No results found for: HMDIABFOOTEX   Lab Results  Component Value Date   CHOL 162 09/06/2021   HDL 71 09/06/2021   LDLCALC 79 09/06/2021   TRIG 59 09/06/2021   CHOLHDL 2.3 09/06/2021       Latest Ref Rng & Units 09/06/2021   10:02 AM 07/02/2021    2:47 PM 02/16/2019    3:30 PM  Hepatic Function  Total Protein 6.0 - 8.5 g/dL 6.5   6.7   6.7    Albumin 3.7 - 4.7 g/dL 4.3   3.8   4.3    AST 0 - 40 IU/L '20   22   19    ' ALT 0 - 32 IU/L '10   15   17    ' Alk Phosphatase 44 - 121 IU/L 98   64   103  Total Bilirubin 0.0 - 1.2 mg/dL 0.3   0.7   0.6      Lab Results  Component Value Date/Time   TSH 1.250 09/06/2021 10:02 AM   TSH 2.130 05/30/2020 03:18 PM   FREET4 0.93 01/13/2018 11:40 AM       Latest Ref Rng & Units 09/06/2021   10:02 AM 07/13/2021    5:50 AM 07/12/2021    4:31 AM  CBC  WBC 3.4 - 10.8 x10E3/uL 5.7   6.6   6.8    Hemoglobin 11.1 - 15.9 g/dL 13.7   10.2   12.9    Hematocrit 34.0 - 46.6 % 41.7   31.8   38.2    Platelets 150 - 450 x10E3/uL 270   173   225      No results found for: VD25OH  Clinical ASCVD: Yes  The ASCVD  Risk score (Arnett DK, et al., 2019) failed to calculate for the following reasons:   The 2019 ASCVD risk score is only valid for ages 85 to 17   The patient has a prior MI or stroke diagnosis       04/23/2022    3:01 PM 01/10/2021    2:42 PM 01/10/2020    2:55 PM  Depression screen PHQ 2/9  Decreased Interest 0 0 0  Down, Depressed, Hopeless 0 0 0  PHQ - 2 Score 0 0 0  Altered sleeping 0    Tired, decreased energy 0    Change in appetite 0    Feeling bad or failure about yourself  0    Trouble concentrating 0    Moving slowly or fidgety/restless 0    Suicidal thoughts 0    PHQ-9 Score 0    Difficult doing work/chores Not difficult at all      Social History   Tobacco Use  Smoking Status Former   Packs/day: 0.50   Years: 35.00   Pack years: 17.50   Types: Cigarettes   Quit date: 1991   Years since quitting: 32.3  Smokeless Tobacco Never  Tobacco Comments   Quit approx 1990   BP Readings from Last 3 Encounters:  04/23/22 (!) 101/50  09/04/21 124/74  07/16/21 (!) 119/37   Pulse Readings from Last 3 Encounters:  04/23/22 (!) 53  09/04/21 78  07/16/21 66   Wt Readings from Last 3 Encounters:  04/23/22 175 lb 4.8 oz (79.5 kg)  09/04/21 171 lb (77.6 kg)  07/11/21 176 lb (79.8 kg)   BMI Readings from Last 3 Encounters:  04/23/22 30.09 kg/m  09/04/21 29.35 kg/m  07/11/21 30.21 kg/m    Assessment/Interventions: Review of patient past medical history, allergies, medications, health status, including review of consultants reports, laboratory and other test data, was performed as part of comprehensive evaluation and provision of chronic care management services.   SDOH:  (Social Determinants of Health) assessments and interventions performed: Yes  SDOH Screenings   Alcohol Screen: Low Risk    Last Alcohol Screening Score (AUDIT): 0  Depression (PHQ2-9): Low Risk    PHQ-2 Score: 0  Financial Resource Strain: Low Risk    Difficulty of Paying Living Expenses: Not  hard at all  Food Insecurity: Not on file  Housing: Not on file  Physical Activity: Not on file  Social Connections: Not on file  Stress: Not on file  Tobacco Use: Medium Risk   Smoking Tobacco Use: Former   Smokeless Tobacco Use: Never   Passive Exposure: Not on file  Transportation Needs:  Not on file    CCM Care Plan  Allergies  Allergen Reactions   Sulfa Antibiotics Hives   Ivp Dye [Iodinated Contrast Media] Swelling   Amiodarone Nausea And Vomiting and Cough   Codeine Other (See Comments)    ALTERED MENTAL STATUS    Medications Reviewed Today     Reviewed by Alanson Puls, CMA (Certified Medical Assistant) on 04/23/22 at 1455  Med List Status: <None>   Medication Order Taking? Sig Documenting Provider Last Dose Status Informant  acetaminophen (TYLENOL) 650 MG CR tablet [id number] Yes Take 650 mg by mouth every 8 (eight) hours as needed for pain. [provider] Taking Active Self  apixaban (ELIQUIS) 5 MG TABS tablet [id number] Yes Take 1 tablet (5 mg total) by mouth 2 (two) times daily. Wellington Hampshire, MD Taking Active Self  budesonide-formoterol [facility]) 160-4.5 MCG/ACT inhaler [id number] Yes Inhale 2 puffs into the lungs 2 (two) times daily. Jerrol Banana., MD Taking Active            Med Note Kellie Simmering, Jenene Slicker   Fri Apr 12, 2021 10:26 AM)    cetirizine (ZYRTEC) 10 MG tablet [id number] Yes TAKE 1 TABLET BY MOUTH EVERY DAY Myles Gip, DO Taking Active   docusate sodium (COLACE) 100 MG capsule [id number] Yes Take 1 capsule (100 mg total) by mouth 2 (two) times daily. Duanne Guess, PA-C Taking Active   donepezil (ARICEPT) 5 MG tablet [id number] Yes TAKE 1 TABLET BY MOUTH EVERYDAY AT BEDTIME Jerrol Banana., MD Taking Active   fluticasone [facility]) 50 MCG/ACT nasal spray [id number] Yes PLACE 2 SPRAYS INTO BOTH NOSTRILS DAILY AS NEEDED FOR ALLERGIES. Jerrol Banana., MD Taking Active   furosemide (LASIX) 20 MG tablet [id number] Yes  TAKE 1 TABLET (20 MG TOTAL) BY MOUTH DAILY AS NEEDED (SHORTNESS OF BREATH). Minna Merritts, MD Taking Active Self           Med Note Ivor Reining Jun 27, 2021  1:42 PM)    gabapentin (NEURONTIN) 300 MG capsule [id number] Yes TAKE TWO TABS BY MOUTH IN THE AM, TWO TABLETS BY MOUTH AT BEDTIME, AND 1 TABLET AT DINNER AS NEEDED.  Patient taking differently: 300 mg. 1 capsule in the morning, 2 capsules in the evening   Jerrol Banana., MD Taking Active   HYDROcodone-acetaminophen (NORCO/VICODIN) 5-325 MG tablet [id number] Yes Take 1-2 tablets by mouth every 4 (four) hours as needed for moderate pain (pain score 4-6). Duanne Guess, PA-C Taking Active   memantine [facility]) 10 MG tablet [id number] Yes Take 10 mg by mouth in the morning and at bedtime. [provider] Taking Active   methocarbamol (ROBAXIN) 500 MG tablet [id number] Yes Take 1 tablet (500 mg total) by mouth every 6 (six) hours as needed for muscle spasms. Duanne Guess, PA-C Taking Active   metoprolol succinate (TOPROL-XL) 50 MG 24 hr tablet [id number] Yes Take by mouth. [provider] Taking Active   Multiple Vitamin (MULTIVITAMIN WITH MINERALS) TABS tablet [id number] Yes Take 1 tablet by mouth daily. [provider] Taking Active   OTEZLA 30 MG TABS [id number] Yes Take 30 mg by mouth in the morning and at bedtime. [provider] Taking Active Self  rosuvastatin (CRESTOR) 5 MG tablet [id number] Yes TAKE 1 TABLET BY MOUTH EVERY DAY Jerrol Banana., MD Taking Active   sacubitril-valsartan [facility]) 24-26 MG [id number] Yes Take 1 tablet by mouth 2 (two) times  daily. [provider] Taking Active   traMADol (ULTRAM) 50 MG tablet [id number] Yes Take 1 tablet (50 mg total) by mouth every 6 (six) hours as needed. Duanne Guess, PA-C Taking Active   venlafaxine [facility]) 75 MG tablet [id number] Yes TAKE 2 TABLETS (150 MG TOTAL) BY MOUTH DAILY. Jerrol Banana., MD Taking  Active   vitamin B-12 (CYANOCOBALAMIN) 1000 MCG tablet [id number] Yes Take 1,000 mcg by mouth daily. [provider] Taking Active             Patient Active Problem List   Diagnosis Date Noted   Status post total hip replacement, right 07/11/2021   Need for prophylactic vaccination against rabies 10/11/2020   Puncture wound 10/11/2020   Need for Tdap vaccination 10/11/2020   Dog bite of right lower leg with infection 10/11/2020   Neuropathy 05/02/2020   Chronic obstructive pulmonary disease ([place]) 03/11/2019   Atrial fibrillation ([place]) 11/11/2018   A-fib ([place]) 10/12/2018   Rheumatoid arthritis ([place]) 10/10/2018   Atrial fibrillation with RVR ([employer]) 10/10/2018   Psoriatic arthritis ([place]) 03/04/2018   Unstable angina (H[facility])    Atrial fibrillation with rapid ventricular response ([place]) 01/13/2018   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   Asthma 05/09/2017   Primary osteoarthritis of both first carpometacarpal joints 01/31/2016   Arthralgia of both hands 01/31/2016   Ventricular premature depolarization 09/11/2015   Psoriasis 07/16/2015   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   Hyperlipidemia 03/29/2015   Pre-syncope 03/04/2015   CAD (coronary artery disease)    PVC's (premature ventricular contractions)    Ischemic cardiomyopathy    DDD (degenerative disc disease), lumbar 01/18/2015   Intervertebral disc disorder with radiculopathy of lumbar region 12/26/2014   L-S radiculopathy 07/03/2014   Breast CA ([place]) 06/28/2014   Arthritis, degenerative 06/28/2014   Acid reflux 03/08/2014   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    Immunization History  Administered Date(s) Administered   Fluad Quad(high Dose 65+) 09/12/2020, 09/04/2021   Influenza Whole 08/22/2018   Influenza, High Dose Seasonal PF 10/04/2015, 10/06/2016, 09/16/2017, 08/31/2018, 10/07/2019   Influenza-Unspecified 07/22/2014   Moderna Sars-Covid-2 Vaccination 02/02/2020, 03/01/2020,  11/09/2020   Pneumococcal Conjugate-13 10/06/2016   Pneumococcal Polysaccharide-23 11/13/2009   Tdap 10/11/2020    Conditions to be addressed/monitored:  Hypertension, Atrial Fibrillation, Heart Failure, Coronary Artery Disease, GERD, COPD, Asthma, Osteoarthritis, and Psoriatic Arthritis and Insomnia  Care Plan : General Pharmacy (Adult)  Updates made by Germaine Pomfret, RPH since 04/24/2022 12:00 AM     Problem: Hyperlipidemia, Atrial Fibrillation, Heart Failure, Coronary Artery Disease, GERD, COPD, Asthma, Osteoarthritis and Psoriatic Arthritis and Insomnia   Priority: High     Long-Range Goal: Patient-Specific Goal   Start Date: 02/11/2021  Expected End Date: 02/04/2023  This Visit's Progress: On track  Recent Progress: On track  Priority: High  Note:   Current Barriers:  No barriers noted  Pharmacist Clinical Goal(s):  Patient will maintain control of heart failure as evidenced by stable weight; no heart failure exacerbations  through collaboration with PharmD and provider.   Interventions: 1:1 collaboration with Jerrol Banana., MD regarding development and update of comprehensive plan of care as evidenced by provider attestation and co-signature Inter-disciplinary care team collaboration (see longitudinal plan of care) Comprehensive medication review performed; medication list updated in electronic medical record  Atrial Fibrillation (Goal: prevent stroke and major bleeding) -Controlled -Current treatment: Rate control: Metoprolol XL 50 mg 1/2 tablet twice daily  Anticoagulation: Eliquis 5 mg twice  daily  -Medications previously tried: NA -Counseled on avoidance of NSAIDs due to increased bleeding risk with anticoagulants; -Recommended to continue current medication  Heart Failure (Goal: manage symptoms and prevent exacerbations) -Controlled -Last ejection fraction: 67% -HF type: Systolic -NYHA Class: II (slight limitation of activity) -AHA HF Stage: C  (Heart disease and symptoms present) -Current treatment: Entresto 24-26 mg twice daily: Appropriate, Effective, Safe, Accessible Furosemide 20 mg daily as needed: Appropriate, Effective, Safe, Accessible  Metoprolol XL 50 mg 1/2 tablet twice daily: Appropriate, Effective, Safe, Accessible  -Medications previously tried: NA -Entresto was stopped after surgery due to hypotension, Helene Kelp reports she was instructed to have the patient resume taking this medication. Denies further instances of hypotension in that time.  -Recommended to continue current medication  Hyperlipidemia: (LDL goal < 100) -Controlled -Current treatment: Rosuvastatin 5 mg daily  -Medications previously tried: NA  -Recommended to continue current medication  Asthma (Goal: control symptoms and prevent exacerbations) -Controlled -Current treatment  Symbicort 2 puffs twice daily  Montelukast 10 mg daily as needed  -Current allergy treatment  Cetirizine 10 mg daily as needed  Flonase 50 mcg/act 2 sprays daily as needed  -Medications previously tried: NA  -Asthma allergy driven, seaosonal symptoms improved -Exacerbations requiring treatment in last 6 months: None -Patient reports consistent use of maintenance inhaler -Frequency of rescue inhaler use: Sporadic -Counseled on Benefits of consistent maintenance inhaler use -Recommended to continue current medication  Alzheimer's Disease (Goal: prevent worsening memory) -Not ideally controlled -managed by Dr. Werner Lean -Current treatment  Donepezil 5 mg nightly: Appropriate, Effective, Safe, Accessible Memantine 10 mg twice daily: Appropriate, Effective, Safe, Accessible  -Medications previously tried: NA  -Recommended to continue current medication  Goals/Self-Care Activities Patient will:  - check blood pressure 2-3 times weekly, document, and provide at future appointments weigh daily, and contact provider if weight gain of greater than 2 pounds  Follow Up Plan:  Telephone follow up appointment with care management team member scheduled for:  08/05/2022 at 3:45 PM       Medication Assistance: None required.  Patient affirms current coverage meets needs.  Compliance/Adherence/Medication fill history: Care Gaps: Shingrix  Star-Rating Drugs: Rosuvastatin 5 mg last filled on 03/22/22 for 90 day supply at [facility].  [facility]  [id number] [address]Phone: [phone]: [phone] [id number] [place] [address]one: [phone]: [id number] [place]one: [phone]: [phone] pill box? Yes Pt endorses 100% compliance  We discussed: Current pharmacy is preferred with insurance plan and patient is satisfied with pharmacy services Patient decided to: Continue current medication management strategy  Care Plan and Follow Up Patient Decision:  Patient agrees to Care Plan and Follow-up.  Plan: Telephone follow up appointment with care management team member scheduled for:  08/05/2022 at 3:45 PM   AleJunius ArgyleharmD, [phone]

## 2022-04-23 ENCOUNTER — Ambulatory Visit (INDEPENDENT_AMBULATORY_CARE_PROVIDER_SITE_OTHER): Payer: Medicare HMO | Admitting: Family Medicine

## 2022-04-23 ENCOUNTER — Encounter: Payer: Self-pay | Admitting: Family Medicine

## 2022-04-23 VITALS — BP 101/50 | HR 53 | Temp 97.7°F | Resp 16 | Wt 175.3 lb

## 2022-04-23 DIAGNOSIS — E7800 Pure hypercholesterolemia, unspecified: Secondary | ICD-10-CM | POA: Diagnosis not present

## 2022-04-23 DIAGNOSIS — I5022 Chronic systolic (congestive) heart failure: Secondary | ICD-10-CM

## 2022-04-23 DIAGNOSIS — L4050 Arthropathic psoriasis, unspecified: Secondary | ICD-10-CM | POA: Diagnosis not present

## 2022-04-23 DIAGNOSIS — G3184 Mild cognitive impairment, so stated: Secondary | ICD-10-CM | POA: Diagnosis not present

## 2022-04-23 DIAGNOSIS — M5136 Other intervertebral disc degeneration, lumbar region: Secondary | ICD-10-CM | POA: Diagnosis not present

## 2022-04-23 DIAGNOSIS — I4891 Unspecified atrial fibrillation: Secondary | ICD-10-CM

## 2022-04-23 DIAGNOSIS — Z96652 Presence of left artificial knee joint: Secondary | ICD-10-CM | POA: Diagnosis not present

## 2022-04-23 DIAGNOSIS — G629 Polyneuropathy, unspecified: Secondary | ICD-10-CM | POA: Diagnosis not present

## 2022-04-23 DIAGNOSIS — Z96641 Presence of right artificial hip joint: Secondary | ICD-10-CM | POA: Diagnosis not present

## 2022-04-23 DIAGNOSIS — Z951 Presence of aortocoronary bypass graft: Secondary | ICD-10-CM | POA: Diagnosis not present

## 2022-04-23 NOTE — Progress Notes (Signed)
?  ? ? ?I,Jana Robinson,acting as a scribe for [name], MD.,have documented all relevant documentation on the behalf of [name], MD,as directed by  [name], MD while in the presence of [name], MD. ?e ?Established patient visit ? ? ?Patient: Teresa [name]   DOB: 09/11/1941   81 y.o. Female  MRN: [id number] ?Visit Date: 04/23/2022 ? ?Today's healthcare provider: [name], MD  ? ?Chief Complaint  ?Patient presents with  ? Foot Problem  ? ?Subjective  ?  ?Patient presents today for bilateral foot burning/bottoms with the right being the worst.  Feels like walking on marshmallows x a few months.  Patient takes Tylenol with no noticeable relief. Thinks it may be neuropathy or coming from her back.  She is tolerating gabapentin 300 mg dosing and it helps some. ?She has no other complaints and overall feels fairly well.  She states she is taking her medications as prescribed. ?She states her cognitive impairment is slowly getting worse but she is tolerating it well and learning to cope. ?Medications: ?Outpatient Medications Prior to Visit  ?Medication Sig  ? acetaminophen (TYLENOL) 650 MG CR tablet Take 650 mg by mouth every 8 (eight) hours as needed for pain.  ? apixaban (ELIQUIS) 5 MG TABS tablet Take 1 tablet (5 mg total) by mouth 2 (two) times daily.  ? budesonide-formoterol (SYMBICORT) 160-4.5 MCG/ACT inhaler Inhale 2 puffs into the lungs 2 (two) times daily.  ? cetirizine (ZYRTEC) 10 MG tablet TAKE 1 TABLET BY MOUTH EVERY DAY  ? docusate sodium (COLACE) 100 MG capsule Take 1 capsule (100 mg total) by mouth 2 (two) times daily.  ? donepezil (ARICEPT) 5 MG tablet TAKE 1 TABLET BY MOUTH EVERYDAY AT BEDTIME  ? fluticasone (FLONASE) 50 MCG/ACT nasal spray PLACE 2 SPRAYS INTO BOTH NOSTRILS DAILY AS NEEDED FOR ALLERGIES.  ? furosemide (LASIX) 20 MG tablet TAKE 1 TABLET (20 MG TOTAL) BY MOUTH DAILY AS NEEDED (SHORTNESS OF BREATH).  ? gabapentin (NEURONTIN) 300 MG capsule  TAKE TWO TABS BY MOUTH IN THE AM, TWO TABLETS BY MOUTH AT BEDTIME, AND 1 TABLET AT DINNER AS NEEDED. (Patient taking differently: 300 mg. 1 capsule in the morning, 2 capsules in the evening)  ? HYDROcodone-acetaminophen (NORCO/VICODIN) 5-325 MG tablet Take 1-2 tablets by mouth every 4 (four) hours as needed for moderate pain (pain score 4-6).  ? memantine (NAMENDA) 10 MG tablet Take 10 mg by mouth in the morning and at bedtime.  ? methocarbamol (ROBAXIN) 500 MG tablet Take 1 tablet (500 mg total) by mouth every 6 (six) hours as needed for muscle spasms.  ? metoprolol succinate (TOPROL-XL) 50 MG 24 hr tablet Take by mouth.  ? Multiple Vitamin (MULTIVITAMIN WITH MINERALS) TABS tablet Take 1 tablet by mouth daily.  ? OTEZLA 30 MG TABS Take 30 mg by mouth in the morning and at bedtime.  ? rosuvastatin (CRESTOR) 5 MG tablet TAKE 1 TABLET BY MOUTH EVERY DAY  ? sacubitril-valsartan (ENTRESTO) 24-26 MG Take 1 tablet by mouth 2 (two) times daily.  ? traMADol (ULTRAM) 50 MG tablet Take 1 tablet (50 mg total) by mouth every 6 (six) hours as needed.  ? venlafaxine (EFFEXOR) 75 MG tablet TAKE 2 TABLETS (150 MG TOTAL) BY MOUTH DAILY.  ? vitamin B-12 (CYANOCOBALAMIN) 1000 MCG tablet Take 1,000 mcg by mouth daily.  ? ?No facility-administered medications prior to visit.  ? ? ?Review of Systems ? ?Last vitamin B12 and Folate ?Lab Results  ?Component  Value Date  ? [id number] 05/30/2020  ? ?  ?  Objective  ?  ?BP (!) 101/50 (BP Location: Right Arm, Patient Position: Sitting, Cuff Size: Normal)   Pulse (!) 53   Temp 97.7 ?F (36.5 ?C) (Oral)   Resp 16   Wt 175 lb 4.8 oz (79.5 kg)   SpO2 96%   BMI 30.09 kg/m?  ?BP Readings from Last 3 Encounters:  ?04/23/22 (!) 101/50  ?09/04/21 124/74  ?07/16/21 (!) 119/37  ? ?Wt Readings from Last 3 Encounters:  ?04/23/22 175 lb 4.8 oz (79.5 kg)  ?09/04/21 171 lb (77.6 kg)  ?07/11/21 176 lb (79.8 kg)  ? ?  ? ?Physical Exam ?Vitals reviewed.  ?Constitutional:   ?   Appearance: Normal  appearance. She is well-developed.  ?HENT:  ?   Head: Normocephalic and atraumatic.  ?   Right Ear: External ear normal.  ?   Left Ear: External ear normal.  ?   Nose: Nose normal.  ?   Mouth/Throat:  ?   Pharynx: Oropharynx is clear.  ?Eyes:  ?   General: No scleral icterus. ?   Conjunctiva/sclera: Conjunctivae normal.  ?Neck:  ?   Thyroid: No thyromegaly.  ?Cardiovascular:  ?   Rate and Rhythm: Normal rate and regular rhythm.  ?   Heart sounds: Normal heart sounds.  ?Pulmonary:  ?   Effort: Pulmonary effort is normal.  ?   Breath sounds: Normal breath sounds.  ?Chest:  ?Breasts: ?   Right: Normal.  ?   Left: Normal.  ?Abdominal:  ?   Palpations: Abdomen is soft.  ?Skin: ?   General: Skin is warm and dry.  ?Neurological:  ?   General: No focal deficit present.  ?   Mental Status: She is alert and oriented to person, place, and time.  ?   Comments: Normal monofilament exam of the feet  ?Psychiatric:     ?   Mood and Affect: Mood normal.     ?   Behavior: Behavior normal.     ?   Thought Content: Thought content normal.     ?   Judgment: Judgment normal.  ?  ? ? ?No results found for any visits on 04/23/22. ? Assessment & Plan  ?  ? ?1. Neuropathy ?Patient is already on Effexor. ?At this time increase gabapentin to 400 mg 3 times daily. ? ?2. MCI (mild cognitive impairment) ?MMSE on next visit. ?Clinically stable ? ?3. S/P CABG (coronary artery bypass graft) ?All risk factors treated ? ?4. Atrial fibrillation with RVR ([place]) ?On Eliquis ? ?5. Chronic systolic heart failure ([place]) ?Followed by cardiology and on Entresto ? ?6. Atrial fibrillation with rapid ventricular response ([place]) ? ? ?7. Psoriatic arthritis ([place]) ? ? ?8. DDD (degenerative disc disease), lumbar ? ? ?9. Status post total left knee replacement ? ? ?10. Status post total hip replacement, right ? ? ?11. Pure hypercholesterolemia ?On rosuvastatin at low-dose for tolerance. ? ? ?No follow-ups on file.  ?   ? ?I, [name], MD, have reviewed all  documentation for this visit. The documentation on 04/29/22 for the exam, diagnosis, procedures, and orders are all accurate and complete. ? ? ? ?Lillieanna Tuohy Cranford Mon, MD  ?[facility] ?[phone] (phone) ?[phone] (fax) ? ?[facility] ?

## 2022-04-24 DIAGNOSIS — D2262 Melanocytic nevi of left upper limb, including shoulder: Secondary | ICD-10-CM | POA: Diagnosis not present

## 2022-04-24 DIAGNOSIS — D0471 Carcinoma in situ of skin of right lower limb, including hip: Secondary | ICD-10-CM | POA: Diagnosis not present

## 2022-04-24 DIAGNOSIS — D485 Neoplasm of uncertain behavior of skin: Secondary | ICD-10-CM | POA: Diagnosis not present

## 2022-04-24 DIAGNOSIS — D225 Melanocytic nevi of trunk: Secondary | ICD-10-CM | POA: Diagnosis not present

## 2022-04-24 DIAGNOSIS — D0461 Carcinoma in situ of skin of right upper limb, including shoulder: Secondary | ICD-10-CM | POA: Diagnosis not present

## 2022-04-24 DIAGNOSIS — D2261 Melanocytic nevi of right upper limb, including shoulder: Secondary | ICD-10-CM | POA: Diagnosis not present

## 2022-04-24 DIAGNOSIS — L570 Actinic keratosis: Secondary | ICD-10-CM | POA: Diagnosis not present

## 2022-04-24 DIAGNOSIS — Z85828 Personal history of other malignant neoplasm of skin: Secondary | ICD-10-CM | POA: Diagnosis not present

## 2022-04-24 NOTE — Patient Instructions (Signed)
Visit Information ?It was great speaking with you today!  Please let me know if you have any questions about our visit. ? ? Goals Addressed   ? ?  ?  ?  ?  ? This Visit's Progress  ?  Track and Manage Fluids and Swelling-Heart Failure   On track  ?  Timeframe:  Long-Range Goal ?Priority:  High ?Start Date:  02/11/2021                           ?Expected End Date: 08/11/2021                     ? ?Follow Up Date 07/31/2021  ?  ?- call office if I gain more than 2 pounds in one day or 5 pounds in one week  ?  ?Why is this important?   ?It is important to check your weight daily and watch how much salt and liquids you have.  ?It will help you to manage your heart failure.  ?  ?Notes:  ?  ? ?  ? ? ?Patient Care Plan: General Pharmacy (Adult)  ?  ? ?Problem Identified: Hyperlipidemia, Atrial Fibrillation, Heart Failure, Coronary Artery Disease, GERD, COPD, Asthma, Osteoarthritis and Psoriatic Arthritis and Insomnia   ?Priority: High  ?  ? ?Long-Range Goal: Patient-Specific Goal   ?Start Date: 02/11/2021  ?Expected End Date: 02/04/2023  ?This Visit's Progress: On track  ?Recent Progress: On track  ?Priority: High  ?Note:   ?Current Barriers:  ?No barriers noted ? ?Pharmacist Clinical Goal(s):  ?Patient will maintain control of heart failure as evidenced by stable weight; no heart failure exacerbations  through collaboration with PharmD and provider.  ? ?Interventions: ?1:1 collaboration with Jerrol Banana., MD regarding development and update of comprehensive plan of care as evidenced by provider attestation and co-signature ?Inter-disciplinary care team collaboration (see longitudinal plan of care) ?Comprehensive medication review performed; medication list updated in electronic medical record ? ?Atrial Fibrillation (Goal: prevent stroke and major bleeding) ?-Controlled ?-Current treatment: ?Rate control: Metoprolol XL 50 mg 1/2 tablet twice daily  ?Anticoagulation: Eliquis 5 mg twice daily  ?-Medications previously  tried: NA ?-Counseled on avoidance of NSAIDs due to increased bleeding risk with anticoagulants; ?-Recommended to continue current medication ? ?Heart Failure (Goal: manage symptoms and prevent exacerbations) ?-Controlled ?-Last ejection fraction: 45% ?-HF type: Systolic ?-NYHA Class: II (slight limitation of activity) ?-AHA HF Stage: C (Heart disease and symptoms present) ?-Current treatment: ?Entresto 24-26 mg twice daily: Appropriate, Effective, Safe, Accessible ?Furosemide 20 mg daily as needed: Appropriate, Effective, Safe, Accessible  ?Metoprolol XL 50 mg 1/2 tablet twice daily: Appropriate, Effective, Safe, Accessible  ?-Medications previously tried: NA ?-Entresto was stopped after surgery due to hypotension, Helene Kelp reports she was instructed to have the patient resume taking this medication. Denies further instances of hypotension in that time.  ?-Recommended to continue current medication ? ?Hyperlipidemia: (LDL goal < 100) ?-Controlled ?-Current treatment: ?Rosuvastatin 5 mg daily  ?-Medications previously tried: NA  ?-Recommended to continue current medication ? ?Asthma (Goal: control symptoms and prevent exacerbations) ?-Controlled ?-Current treatment  ?Symbicort 2 puffs twice daily  ?Montelukast 10 mg daily as needed  ?-Current allergy treatment  ?Cetirizine 10 mg daily as needed  ?Flonase 50 mcg/act 2 sprays daily as needed  ?-Medications previously tried: NA  ?-Asthma allergy driven, seaosonal symptoms improved ?-Exacerbations requiring treatment in last 6 months: None ?-Patient reports consistent use of maintenance inhaler ?-Frequency of  rescue inhaler use: Sporadic ?-Counseled on Benefits of consistent maintenance inhaler use ?-Recommended to continue current medication ? ?Alzheimer's Disease (Goal: prevent worsening memory) ?-Not ideally controlled ?-managed by Dr. Werner Lean ?-Current treatment  ?Donepezil 5 mg nightly: Appropriate, Effective, Safe, Accessible ?Memantine 10 mg twice daily:  Appropriate, Effective, Safe, Accessible  ?-Medications previously tried: NA  ?-Recommended to continue current medication ? ?Goals/Self-Care Activities ?Patient will:  ?- check blood pressure 2-3 times weekly, document, and provide at future appointments ?weigh daily, and contact provider if weight gain of greater than 2 pounds ? ?Follow Up Plan: Telephone follow up appointment with care management team member scheduled for:  08/05/2022 at 3:45 PM  ?  ? ? ? ?Patient agreed to services and verbal consent obtained.  ? ?Patient verbalizes understanding of instructions and care plan provided today and agrees to view in [place]. Active MyChart status confirmed with patient.   ? ?[name], PharmD, BCACP, CPP  ?Clinical Pharmacist Practitioner  ?[place] ?[phone]  ?

## 2022-04-30 ENCOUNTER — Telehealth: Payer: Self-pay | Admitting: Family Medicine

## 2022-04-30 NOTE — Telephone Encounter (Signed)
Copied from [place]. Topic: Medicare AWV ?>> Apr 30, 2022 10:59 AM Cher Nakai R wrote: ?Reason for CRM:  ?Left message for patient to call back and schedule Medicare Annual Wellness Visit (AWV) in office.  ? ?If unable to come into the office for AWV,  please offer to do virtually or by telephone. ? ?Last AWV: 01/10/2021 ? ?Please schedule at anytime with [facility] Advisor. ? ?30 minute appointment for Virtual or phone ?45 minute appointment for in office or Initial virtual/phone ? ?Any questions, please contact me at [phone] ?

## 2022-05-21 DIAGNOSIS — E785 Hyperlipidemia, unspecified: Secondary | ICD-10-CM

## 2022-05-21 DIAGNOSIS — I4891 Unspecified atrial fibrillation: Secondary | ICD-10-CM

## 2022-05-21 DIAGNOSIS — J449 Chronic obstructive pulmonary disease, unspecified: Secondary | ICD-10-CM

## 2022-05-21 DIAGNOSIS — I5020 Unspecified systolic (congestive) heart failure: Secondary | ICD-10-CM

## 2022-05-21 DIAGNOSIS — J45909 Unspecified asthma, uncomplicated: Secondary | ICD-10-CM

## 2022-05-21 DIAGNOSIS — G309 Alzheimer's disease, unspecified: Secondary | ICD-10-CM | POA: Diagnosis not present

## 2022-05-21 DIAGNOSIS — Z87891 Personal history of nicotine dependence: Secondary | ICD-10-CM

## 2022-05-26 ENCOUNTER — Telehealth: Payer: Self-pay

## 2022-05-26 NOTE — Progress Notes (Signed)
Chronic Care Management Pharmacy Assistant   Name: KUSHI KUN  MRN: [id number] DOB: 11-17-1941  Reason for Encounter: Medication Review/General Adherence Call.  Recent office visits:  04/23/2022 Dr. [name] MD (PCP) Increase gabapentin to 400 mg 3 times daily  Recent consult visits:  No Recent consult visit  Hospital visits:  None in previous 6 months  Medications: Outpatient Encounter Medications as of 05/26/2022  Medication Sig   acetaminophen (TYLENOL) 650 MG CR tablet Take 650 mg by mouth every 8 (eight) hours as needed for pain.   apixaban (ELIQUIS) 5 MG TABS tablet Take 1 tablet (5 mg total) by mouth 2 (two) times daily.   budesonide-formoterol (SYMBICORT) 160-4.5 MCG/ACT inhaler Inhale 2 puffs into the lungs 2 (two) times daily.   cetirizine (ZYRTEC) 10 MG tablet TAKE 1 TABLET BY MOUTH EVERY DAY   docusate sodium (COLACE) 100 MG capsule Take 1 capsule (100 mg total) by mouth 2 (two) times daily.   donepezil (ARICEPT) 5 MG tablet TAKE 1 TABLET BY MOUTH EVERYDAY AT BEDTIME   fluticasone (FLONASE) 50 MCG/ACT nasal spray PLACE 2 SPRAYS INTO BOTH NOSTRILS DAILY AS NEEDED FOR ALLERGIES.   furosemide (LASIX) 20 MG tablet TAKE 1 TABLET (20 MG TOTAL) BY MOUTH DAILY AS NEEDED (SHORTNESS OF BREATH).   gabapentin (NEURONTIN) 300 MG capsule TAKE TWO TABS BY MOUTH IN THE AM, TWO TABLETS BY MOUTH AT BEDTIME, AND 1 TABLET AT DINNER AS NEEDED. (Patient taking differently: 300 mg. 1 capsule in the morning, 2 capsules in the evening)   HYDROcodone-acetaminophen (NORCO/VICODIN) 5-325 MG tablet Take 1-2 tablets by mouth every 4 (four) hours as needed for moderate pain (pain score 4-6).   memantine (NAMENDA) 10 MG tablet Take 10 mg by mouth in the morning and at bedtime.   methocarbamol (ROBAXIN) 500 MG tablet Take 1 tablet (500 mg total) by mouth every 6 (six) hours as needed for muscle spasms.   metoprolol succinate (TOPROL-XL) 50 MG 24 hr tablet Take by mouth.   Multiple Vitamin  (MULTIVITAMIN WITH MINERALS) TABS tablet Take 1 tablet by mouth daily.   OTEZLA 30 MG TABS Take 30 mg by mouth in the morning and at bedtime.   rosuvastatin (CRESTOR) 5 MG tablet TAKE 1 TABLET BY MOUTH EVERY DAY   sacubitril-valsartan (ENTRESTO) 24-26 MG Take 1 tablet by mouth 2 (two) times daily.   traMADol (ULTRAM) 50 MG tablet Take 1 tablet (50 mg total) by mouth every 6 (six) hours as needed.   venlafaxine (EFFEXOR) 75 MG tablet TAKE 2 TABLETS (150 MG TOTAL) BY MOUTH DAILY.   vitamin B-12 (CYANOCOBALAMIN) 1000 MCG tablet Take 1,000 mcg by mouth daily.   No facility-administered encounter medications on file as of 05/26/2022.    Care Gaps: Shingrix Vaccine COVID-19 Vaccine   Star Rating Drugs: Rosuvastatin 5 mg last filled on 03/22/2022 90 day supply at [facility]. Entresto 24-26 mg last filled 04/23/2022 for [facility] day supply at [facility].   Medication Fill Gaps: None ID  Called patient and discussed medication adherence  with patient, No issues at this time with current medication.   Patient daughter Denies ED visit since her last CPP follow up.  Patient daughter Denies  any side effects with her medication. Patient daughter Denies  any problems with hercurrent pharmacy  Patient daughter denies any questions/concerns for the clinical pharmacist.Patient daughter denies any needs for refill for the patient medications.  Telephone follow up appointment with Care management team member scheduled for : 08/05/2022 at 3:00 pm.  [place] Pharmacist Assistant [phone]

## 2022-06-10 IMAGING — MG DIGITAL SCREENING BILAT W/ TOMO W/ CAD
6 of 10 series · 6 of 30 positions shown · non-contrast
Comparison: Previous exam(s).

CLINICAL DATA: Screening.

EXAM:
DIGITAL SCREENING BILATERAL MAMMOGRAM WITH TOMO AND CAD

[L CC synth-2D]
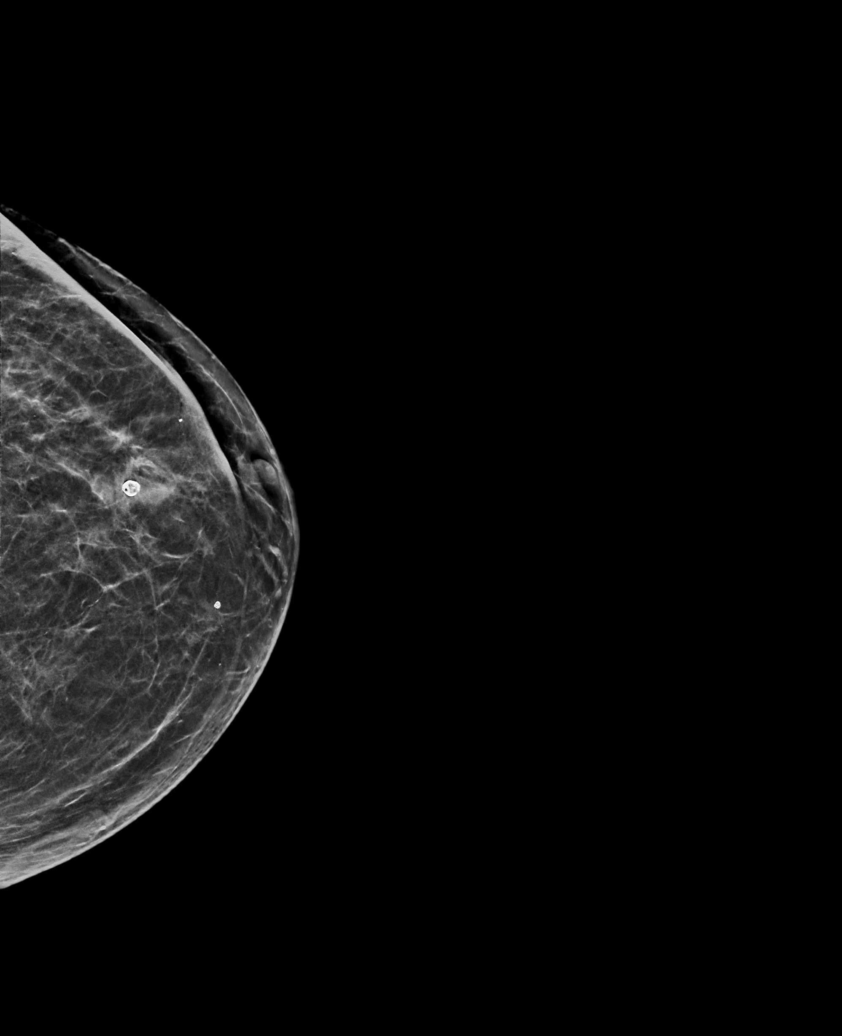

[L MLO synth-2D]
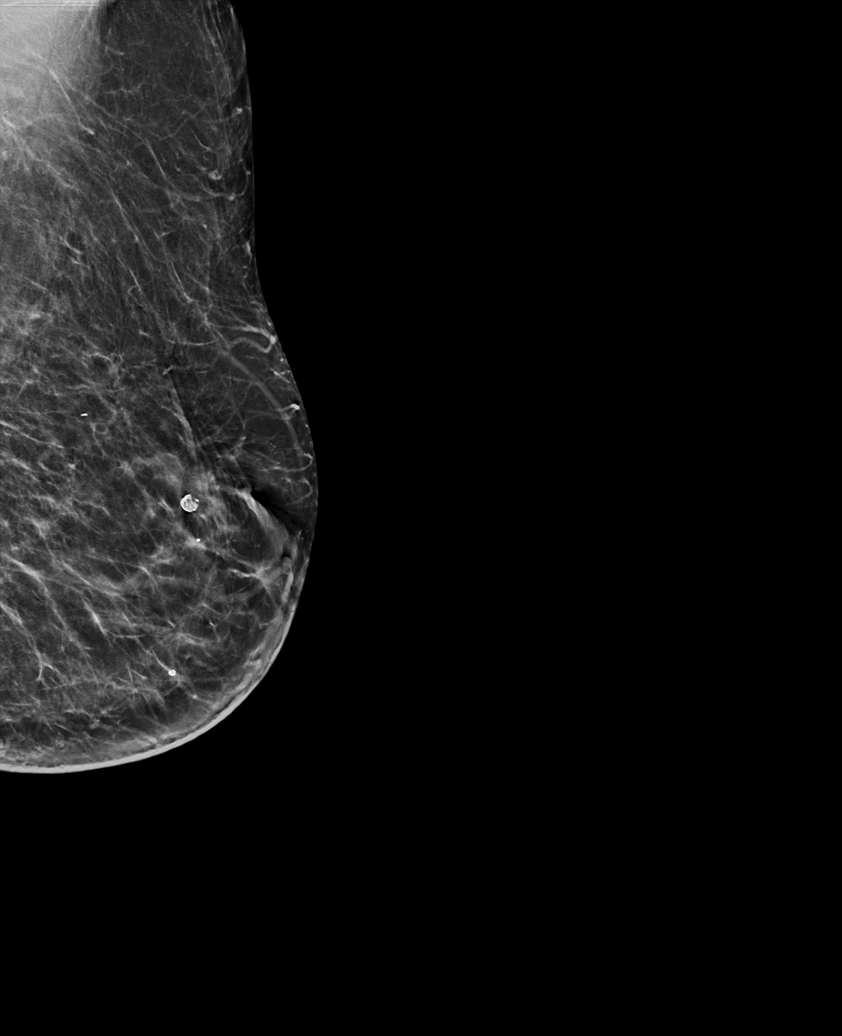

[R MLO synth-2D (1 of 2)]
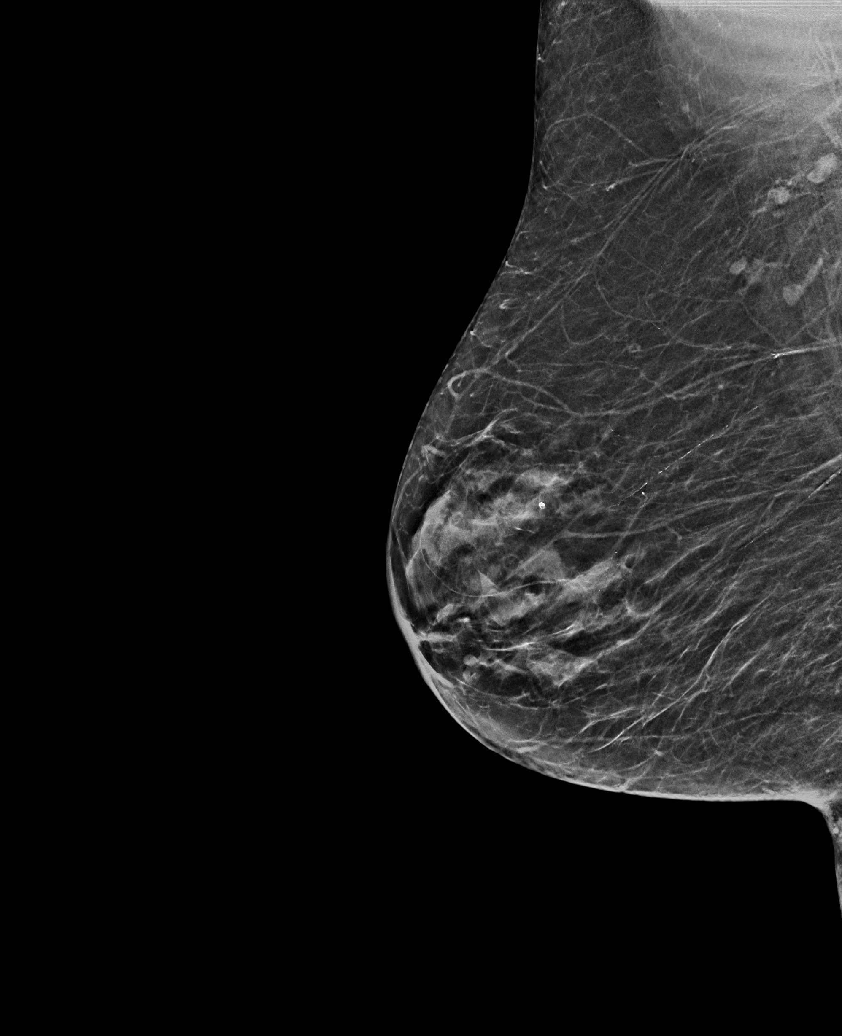

[R MLO synth-2D (2 of 2)]
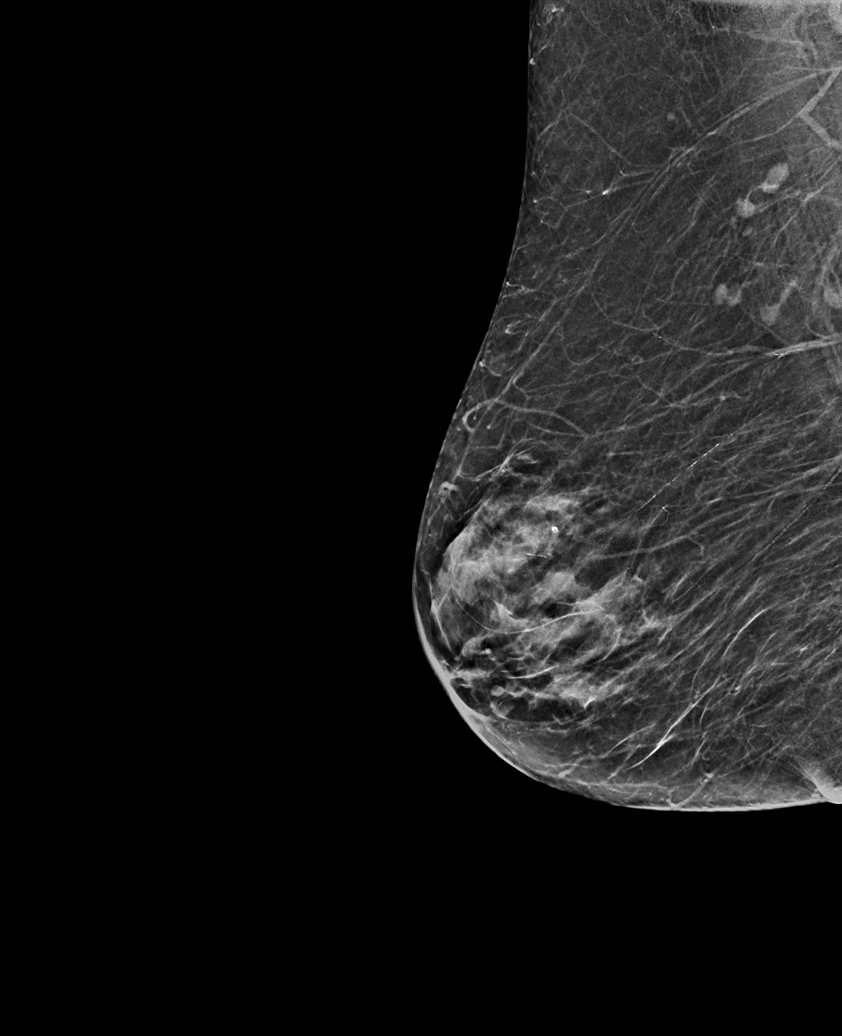

[R CC synth-2D]
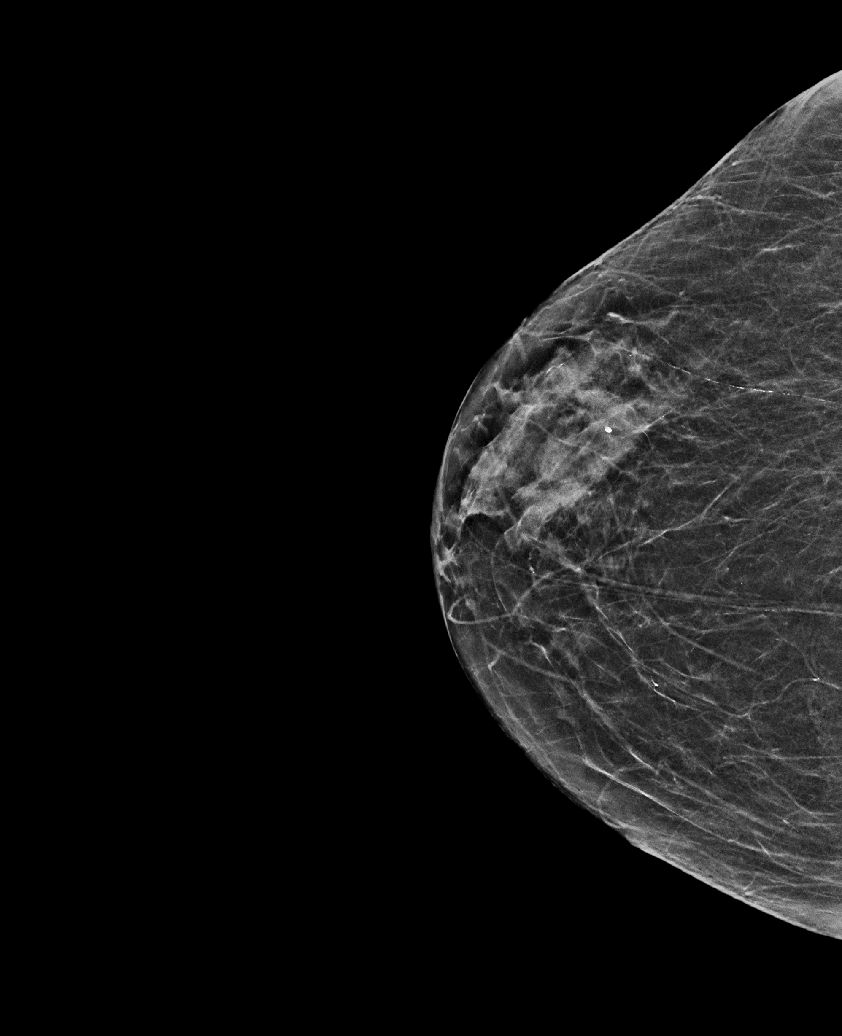

[R CC tomo · tomo slice 25/48.0]
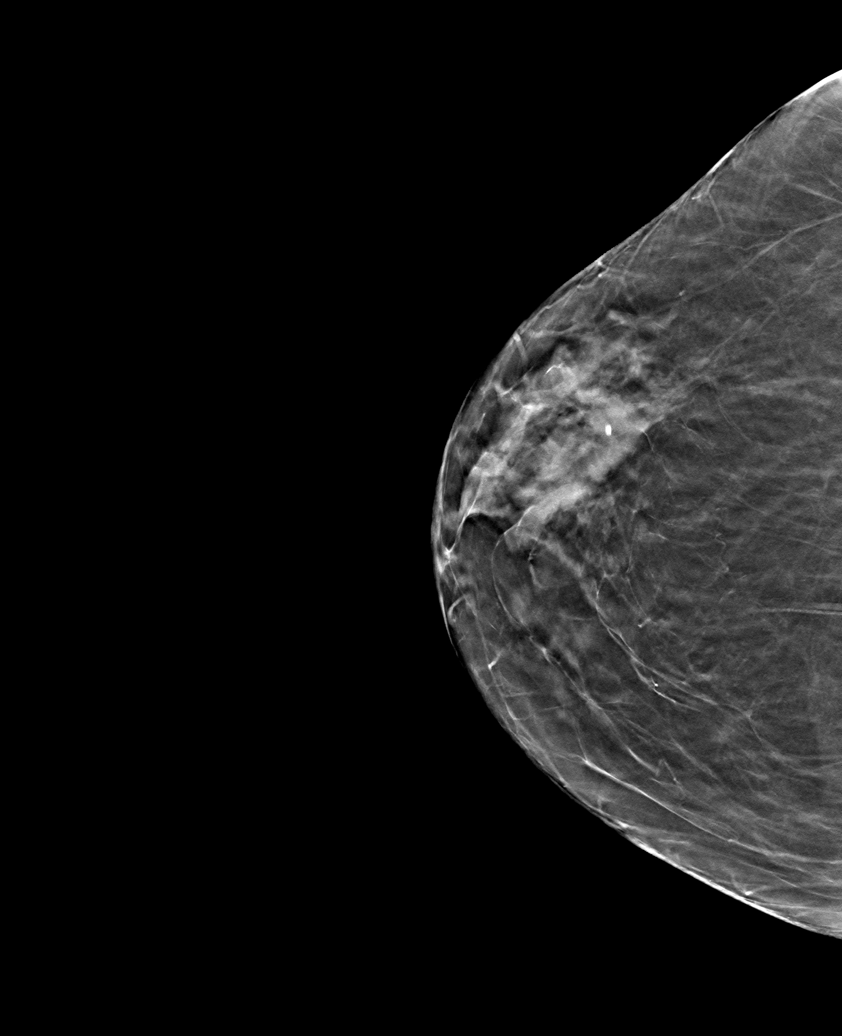

[6 of 30 positions shown; findings below may reference images not displayed]

ACR Breast Density Category b: There are scattered areas of
fibroglandular density.
FINDINGS: There are no findings suspicious for malignancy. Images were
processed with CAD.
IMPRESSION: No mammographic evidence of malignancy. A result letter of this
screening mammogram will be mailed directly to the patient.

RECOMMENDATION:
Screening mammogram in one year. (Code:[id number])

BI-RADS CATEGORY  1: Negative.

## 2022-06-14 ENCOUNTER — Other Ambulatory Visit: Payer: Self-pay | Admitting: Family Medicine

## 2022-07-01 DIAGNOSIS — Z9889 Other specified postprocedural states: Secondary | ICD-10-CM | POA: Insufficient documentation

## 2022-07-01 DIAGNOSIS — Z7901 Long term (current) use of anticoagulants: Secondary | ICD-10-CM | POA: Insufficient documentation

## 2022-07-01 DIAGNOSIS — I480 Paroxysmal atrial fibrillation: Secondary | ICD-10-CM | POA: Insufficient documentation

## 2022-07-02 ENCOUNTER — Ambulatory Visit (INDEPENDENT_AMBULATORY_CARE_PROVIDER_SITE_OTHER): Payer: Medicare HMO

## 2022-07-02 VITALS — Wt 175.0 lb

## 2022-07-02 DIAGNOSIS — Z0000 Encounter for general adult medical examination without abnormal findings: Secondary | ICD-10-CM

## 2022-07-02 NOTE — Progress Notes (Signed)
Virtual Visit via Telephone Note  I connected with  Teresa [name] on 07/02/22 at  1:30 PM EDT by telephone and verified that I am speaking with the correct person using two identifiers.  Location: Patient: home Provider: [facility] Persons participating in the virtual visit: [place]   I discussed the limitations, risks, security and privacy concerns of performing an evaluation and management service by telephone and the availability of in person appointments. The patient expressed understanding and agreed to proceed.  Interactive audio and video telecommunications were attempted between this nurse and patient, however failed, due to patient having technical difficulties OR patient did not have access to video capability.  We continued and completed visit with audio only.  Some vital signs may be absent or patient reported.   Dionisio David, LPN  Subjective:   Teresa [name] is a 81 y.o. female who presents for Medicare Annual (Subsequent) preventive examination.  Review of Systems           Objective:    There were no vitals filed for this visit. There is no height or weight on file to calculate BMI.     07/11/2021    1:23 PM 07/02/2021    2:18 PM 02/22/2021   12:24 PM 01/10/2021    2:44 PM 01/10/2020    2:54 PM 01/05/2019    2:52 PM 11/11/2018    8:10 PM  Advanced Directives  Does Patient Have a Medical Advance Directive? Yes Yes No Yes Yes Yes Yes  Type of Paramedic of [place];Living will [place];Living will  [place];Living will [place];Living will Living will;Healthcare Power of [place];Living will  Does patient want to make changes to medical advance directive? Yes (ED - Information included in AVS) No - Patient declined No - Patient declined    No - Patient declined  Copy of [place] in Chart? Yes - validated most  recent copy scanned in chart (See row information) Yes - validated most recent copy scanned in chart (See row information)  Yes - validated most recent copy scanned in chart (See row information) Yes - validated most recent copy scanned in chart (See row information) No - copy requested No - copy requested  Would patient like information on creating a medical advance directive?   No - Patient declined        Current Medications (verified) Outpatient Encounter Medications as of 07/02/2022  Medication Sig   acetaminophen (TYLENOL) 650 MG CR tablet Take 650 mg by mouth every 8 (eight) hours as needed for pain.   amoxicillin (AMOXIL) 500 MG capsule SMARTSIG:4 Capsule(s) By Mouth   apixaban (ELIQUIS) 5 MG TABS tablet Take 1 tablet (5 mg total) by mouth 2 (two) times daily.   budesonide-formoterol (SYMBICORT) 160-4.5 MCG/ACT inhaler Inhale 2 puffs into the lungs 2 (two) times daily.   cetirizine (ZYRTEC) 10 MG tablet TAKE 1 TABLET BY MOUTH EVERY DAY   docusate sodium (COLACE) 100 MG capsule Take 1 capsule (100 mg total) by mouth 2 (two) times daily.   donepezil (ARICEPT) 5 MG tablet TAKE 1 TABLET BY MOUTH EVERYDAY AT BEDTIME   fluticasone (FLONASE) 50 MCG/ACT nasal spray PLACE 2 SPRAYS INTO BOTH NOSTRILS DAILY AS NEEDED FOR ALLERGIES.   furosemide (LASIX) 20 MG tablet TAKE 1 TABLET (20 MG TOTAL) BY MOUTH DAILY AS NEEDED (SHORTNESS OF BREATH).   gabapentin (NEURONTIN) 300 MG capsule TAKE TWO TABS  BY MOUTH IN THE AM, TWO TABLETS BY MOUTH AT BEDTIME, AND 1 TABLET AT DINNER AS NEEDED. (Patient taking differently: 300 mg. 1 capsule in the morning, 2 capsules in the evening)   HYDROcodone-acetaminophen (NORCO/VICODIN) 5-325 MG tablet Take 1-2 tablets by mouth every 4 (four) hours as needed for moderate pain (pain score 4-6).   memantine (NAMENDA) 10 MG tablet Take 10 mg by mouth in the morning and at bedtime.   methocarbamol (ROBAXIN) 500 MG tablet Take 1 tablet (500 mg total) by mouth every 6 (six) hours as  needed for muscle spasms.   metoprolol succinate (TOPROL-XL) 50 MG 24 hr tablet Take by mouth.   MODERNA COVID-19 BIVALENT 50 MCG/0.5ML injection    Multiple Vitamin (MULTIVITAMIN WITH MINERALS) TABS tablet Take 1 tablet by mouth daily.   OTEZLA 30 MG TABS Take 30 mg by mouth in the morning and at bedtime.   rosuvastatin (CRESTOR) 5 MG tablet TAKE 1 TABLET BY MOUTH EVERY DAY   sacubitril-valsartan (ENTRESTO) 24-26 MG Take 1 tablet by mouth 2 (two) times daily.   traMADol (ULTRAM) 50 MG tablet Take 1 tablet (50 mg total) by mouth every 6 (six) hours as needed.   venlafaxine (EFFEXOR) 75 MG tablet TAKE 2 TABLETS (150 MG TOTAL) BY MOUTH DAILY.   vitamin B-12 (CYANOCOBALAMIN) 1000 MCG tablet Take 1,000 mcg by mouth daily.   No facility-administered encounter medications on file as of 07/02/2022.    Allergies (verified) Sulfa antibiotics, Ivp dye [iodinated contrast media], Amiodarone, and Codeine   History: Past Medical History:  Diagnosis Date   Arthritis    Asthma    Back pain    Breast cancer, left ([place]) 2009   CAD (coronary artery disease)    a. 1994 s/p CABG x 1 (LIMA->LAD); b. 03/2015 MV: No ischemia; c. MV 11/18: small fixed apical defect likely secondary to breast attenuation, EF of 42%, frequent PVCs; d. 12/2017 Cath: LM nl, LAD 20p, D1/2/3 nl, LCX min irregs, OM1/2/3 min irregs, RCA nl, RPDA nl, RPL1/2 nl, LIMA->LAD atretic.   Chronic anticoagulation    Apixaban   Chronic combined systolic (congestive) and diastolic (congestive) heart failure ([place])    a. 2013 EF 40%;  b. 03/2015 Echo: EF 55-60%; c. 12/18 Echo: EF of 35-40%; d. 12/2017 TEE: EF 35-40%; e. 03/2018 Echo: EF 40-45%; f. 09/2018 Echo: EF 35%.   DDD (degenerative disc disease), lumbar    Dental crowns present    caps- left back top, right back bottom   Gastroesophageal reflux disease    Hyperlipidemia    Hypertension    LBBB (left bundle branch block)    MI (myocardial infarction) ([place]) 1994   Mixed Ischemic &  Nonischemic cardiomyopathy    a. 2013 EF 40%;  b. 03/2015 Echo: EF 55-60%; c. 12/18 Echo: EF of 35-40%, hypokinesis of the anteroseptal, and apical myocardium, mild to mod MR, mod dil LA, nl RV fxn, PASP 53 mmHg; d. 12/2017 TEE: EF 35-40%, diff HK, mild to mod MR. small PFO. No LAA/RAA thrombus; e. 03/2018 Echo: EF 40-45%, antsept/inf HK, Gr2 DD, mild MR, mod idl LA, mild to mod TR, PASP 35-46mHg; f. 09/2018 Echo: EF 35%.   Osteoarthritis    left hip and knee   Persistent atrial fibrillation ([place]    a. diagnosed 01/13/2018; b. CHADS2VASc = 6 --> Eliquis; c. 12/2017 s/p TEE/DCCV. Amio started but d/c'd 01/2018 2/2 n/anorexia; d. 10/2018 DCCV-->recurrent Afib w/in days; e. 10/2018 DCCV x 2 in ED->persistent Afib.   Personal  history of radiation therapy    PONV (postoperative nausea and vomiting)    Psoriasis    PSVT (paroxysmal supraventricular tachycardia) ([place])    a. 02/2015 Holter: short runs of SVT and NSVT.   Pulmonary hypertension (HCC)    PVC's (premature ventricular contractions)    a. 03/2018 24h Holter: Freq PVC's with a total of 421 beats in 24 hrs. 7 short runs of SVT likely representing Afib.   Rheumatoid arthritis ([place])    feet, hands   Vertigo    approx 2x/yr   Past Surgical History:  Procedure Laterality Date   ABDOMINAL HYSTERECTOMY     APPENDECTOMY     BREAST BIOPSY Right 1991   negative   BREAST EXCISIONAL BIOPSY Left 2009   positive   BREAST LUMPECTOMY Left 2009   CARDIAC CATHETERIZATION N/A 05/27/1988   Location: [facility]; Surgeon: Doristine Bosworth, MD   CARDIOVERSION N/A 01/15/2018   Procedure: CARDIOVERSION;  Surgeon: Wellington Hampshire, MD;  Location: [facility];  Service: Cardiovascular;  Laterality: N/A;   CARDIOVERSION N/A 10/29/2018   Procedure: CARDIOVERSION;  Surgeon: Minna Merritts, MD;  Location: [facility];  Service: Cardiovascular;  Laterality: N/A;   CARDIOVERSION N/A 11/11/2018   DCCV x 2 attempts at 120 J; Location: [facility]   CERVICAL FUSION  2019    CHOLECYSTECTOMY     COLONOSCOPY     CORONARY ARTERY BYPASS GRAFT  04/11/1993   1v; LIMA-LAD; Location: [facility]; Surgeon: Merrily Pew, MD   FOOT ARTHRODESIS Right 07/24/2016   Procedure: FUSION FIRST METATARSAL CUNEIFORM JOINT RIGHT FOOT, FUSION SECOND METATARSAL CUNEFORM JOINT BUNION REPAIR RIGHT FOOT;  Surgeon: Albertine Patricia, DPM;  Location: [place] [facility];  Service: Podiatry;  Laterality: Right;   HARDWARE REMOVAL Right 07/24/2016   Procedure: REMOVAL HARDWARE LATERAL MALLEOUS RIGHT ANKLE;  Surgeon: Albertine Patricia, DPM;  Location: [facility];  Service: Podiatry;  Laterality: Right;  REMOVAL OF PIN WHICH WAS INTACT   HERNIA REPAIR     ventral   KNEE ARTHROSCOPY Left    LUMBAR FUSION     Pulmonary vein isolation (cardiac ablation) N/A 05/27/2019   Location: [facility]; Surgeon: Mikel Cella, MD   RIGHT/LEFT HEART CATH AND CORONARY/GRAFT ANGIOGRAPHY N/A 01/14/2018   Procedure: LEFT HEART CATH AND CORONARY ANGIOGRAPHY;  Surgeon: Wellington Hampshire, MD;  Location: [facility];  Service: Cardiovascular;  Laterality: N/A;   TEE WITHOUT CARDIOVERSION N/A 01/15/2018   Procedure: TRANSESOPHAGEAL ECHOCARDIOGRAM (TEE);  Surgeon: Wellington Hampshire, MD;  Location: [facility];  Service: Cardiovascular;  Laterality: N/A;   TONSILLECTOMY     TOTAL HIP ARTHROPLASTY Left    TOTAL HIP ARTHROPLASTY Right 07/11/2021   Procedure: TOTAL HIP ARTHROPLASTY ANTERIOR APPROACH;  Surgeon: Hessie Knows, MD;  Location: [facility];  Service: Orthopedics;  Laterality: Right;   TOTAL KNEE ARTHROPLASTY Left 12/19/2013   TUBAL LIGATION     Family History  Problem Relation Age of Onset   Heart attack Father    Congestive Heart Failure Mother    Breast cancer Mother 29   Kidney failure Sister    Prostate cancer Brother    Bladder Cancer Brother    Lung cancer Sister    Arthritis Sister    Breast cancer Sister    Arthritis Sister    Breast cancer Sister    Lung cancer Sister    Lung cancer Brother     Arthritis Brother    Social History   Socioeconomic History   Marital status: Divorced  Spouse name: Not on file   Number of children: 2   Years of education: Not on file   Highest education level: 11th grade  Occupational History   Occupation: retired  Tobacco Use   Smoking status: Former    Packs/day: 0.50    Years: 35.00    Total pack years: 17.50    Types: Cigarettes    Quit date: 1991    Years since quitting: 32.5   Smokeless tobacco: Never   Tobacco comments:    Quit approx 1990  Vaping Use   Vaping Use: Never used  Substance and Sexual Activity   Alcohol use: No   Drug use: No   Sexual activity: Not on file  Other Topics Concern   Not on file  Social History Narrative   Not on file   Social Determinants of Health   Financial Resource Strain: Low Risk  (02/04/2022)   Overall Financial Resource Strain (CARDIA)    Difficulty of Paying Living Expenses: Not hard at all  Food Insecurity: No Food Insecurity (01/10/2021)   Hunger Vital Sign    Worried About Running Out of Food in the Last Year: Never true    Ran Out of Food in the Last Year: Never true  Transportation Needs: No Transportation Needs (02/11/2021)   PRAPARE - Hydrologist (Medical): No    Lack of Transportation (Non-Medical): No  Physical Activity: Inactive (01/10/2021)   Exercise Vital Sign    Days of Exercise per Week: 0 days    Minutes of Exercise per Session: 0 min  Stress: No Stress Concern Present (01/10/2021)   [place]    Feeling of Stress : Not at all  Social Connections: Moderately Isolated (01/10/2021)   Social Connection and Isolation Panel [NHANES]    Frequency of Communication with Friends and Family: More than three times a week    Frequency of Social Gatherings with Friends and Family: More than three times a week    Attends Religious Services: More than 4 times per year    Active Member  of [employer] or Organizations: No    Attends Music therapist: Never    Marital Status: Divorced    Tobacco Counseling Counseling given: Not Answered Tobacco comments: Quit approx 1990   Clinical Intake:  Pre-visit preparation completed: Yes  Pain : No/denies pain     Nutritional Risks: None Diabetes: No  How often do you need to have someone help you when you read instructions, pamphlets, or other written materials from your doctor or pharmacy?: 1 - Never  Diabetic?no  Interpreter Needed?: No  Information entered by :: Kirke Shaggy, LPN   Activities of Daily Living    04/23/2022    3:01 PM 07/11/2021    1:28 PM  In your present state of health, do you have any difficulty performing the following activities:  Hearing? 0   Vision? 1   Difficulty concentrating or making decisions? 1   Walking or climbing stairs? 1   Dressing or bathing? 0   Doing errands, shopping? 0 0    Patient Care Team: Jerrol Banana., MD as PCP - General (Family Medicine) Wellington Hampshire, MD as PCP - Cardiology (Cardiology) Dasher, Rayvon Char, MD as Consulting Physician (Dermatology) Abd-El-Barr, Rogue Jury, MD as Consulting Physician (Neurosurgery) Estevan Ryder, MD as Referring Physician (Cardiology) Troxler, Adele Schilder (Inactive) as Attending Physician (Podiatry) Germaine Pomfret, [facility] as Pharmacist (Pharmacist)  Quintin Alto, MD as Consulting Physician (Rheumatology) Diamond Nickel, DO as Consulting Physician (Sports Medicine) Marchia Meiers, MD (Inactive) as Consulting Physician (Ophthalmology)  Indicate any recent Medical Services you may have received from other than [facility] providers in the past year (date may be approximate).     Assessment:   This is a routine wellness examination for Teresa [name].  Hearing/Vision screen No results found.  Dietary issues and exercise activities discussed:     Goals Addressed   None    Depression Screen    04/23/2022     3:01 PM 01/10/2021    2:42 PM 01/10/2020    2:55 PM 01/10/2020    2:52 PM 01/05/2019    3:01 PM 01/05/2019    2:52 PM 10/22/2017   11:02 AM  PHQ 2/9 Scores  PHQ - 2 Score 0 0 0 0 0 0 1  PHQ- 9 Score 0    4  7    Fall Risk    04/23/2022    3:01 PM 01/10/2021    2:44 PM 01/10/2020    2:55 PM 01/05/2019    2:52 PM 09/16/2017    3:31 PM  [place] in the past year? 0 1 0 0 No  Number falls in past yr: 0 0 0    Injury with Fall? 0 0 0    Follow up Falls evaluation completed Falls prevention discussed       FALL RISK PREVENTION PERTAINING TO THE HOME:  Any stairs in or around the home? No  If so, are there any without handrails? No  Home free of loose throw rugs in walkways, pet beds, electrical cords, etc? Yes  Adequate lighting in your home to reduce risk of falls? Yes   ASSISTIVE DEVICES UTILIZED TO PREVENT FALLS:  Life alert? No  Use of a cane, walker or w/c? No  Grab bars in the bathroom? Yes  Shower chair or bench in shower? No  Elevated toilet seat or a handicapped toilet? Yes    Cognitive Function:declined to test 2023        01/10/2020    3:01 PM 01/05/2019    3:02 PM 09/16/2017    3:36 PM  6CIT Screen  What Year? 0 points 0 points 0 points  What month? 0 points 0 points 0 points  What time? 0 points 0 points 0 points  Count back from 20 0 points 0 points 0 points  Months in reverse 0 points 0 points 0 points  Repeat phrase 0 points 0 points 0 points  Total Score 0 points 0 points 0 points    Immunizations Immunization History  Administered Date(s) Administered   Fluad Quad(high Dose 65+) 09/12/2020, 09/04/2021   Influenza Whole 08/22/2018   Influenza, High Dose Seasonal PF 10/04/2015, 10/06/2016, 09/16/2017, 08/31/2018, 10/07/2019   Influenza-Unspecified 07/22/2014   Moderna Sars-Covid-2 Vaccination 02/02/2020, 03/01/2020, 11/09/2020   PFIZER Comirnaty(Gray Top)Covid-19 Tri-Sucrose Vaccine 05/31/2021   Pneumococcal Conjugate-13 10/06/2016    Pneumococcal Polysaccharide-23 11/13/2009   Tdap 10/11/2020    TDAP status: Up to date  Flu Vaccine status: Up to date  Pneumococcal vaccine status: Up to date  Covid-19 vaccine status: Completed vaccines  Qualifies for Shingles Vaccine? Yes   Zostavax completed No   Shingrix Completed?: No.    Education has been provided regarding the importance of this vaccine. Patient has been advised to call insurance company to determine out of pocket expense if they have not yet received this vaccine. Advised may  also receive vaccine at local pharmacy or Health Dept. Verbalized acceptance and understanding.  Screening Tests Health Maintenance  Topic Date Due   Zoster Vaccines- Shingrix (1 of 2) Never done   COVID-19 Vaccine (5 - Booster for Moderna series) 07/26/2021   INFLUENZA VACCINE  07/22/2022   DEXA SCAN  09/30/2022   TETANUS/TDAP  10/11/2030   Pneumonia Vaccine 39+ Years old  Completed   HPV VACCINES  Aged Out    Health Maintenance  Health Maintenance Due  Topic Date Due   Zoster Vaccines- Shingrix (1 of 2) Never done   COVID-19 Vaccine (5 - Booster for Moderna series) 07/26/2021    Colorectal cancer screening: Type of screening: Colonoscopy. Completed 08/07/17. Repeat every 10 years  Mammogram status: Completed 04/18/22. Repeat every year  Bone Density status: Completed 09/30/17. Results reflect: Bone density results: NORMAL. Repeat every 5 years.  Lung Cancer Screening: (Low Dose CT Chest recommended if Age 33-80 years, 30 pack-year currently smoking OR have quit w/in 15years.) does not qualify.    Additional Screening:  Hepatitis C Screening: does not qualify; Completed no  Vision Screening: Recommended annual ophthalmology exams for early detection of glaucoma and other disorders of the eye. Is the patient up to date with their annual eye exam?  Yes  Who is the provider or what is the name of the office in which the patient attends annual eye exams? Dr.Woodard If pt  is not established with a provider, would they like to be referred to a provider to establish care? No .   Dental Screening: Recommended annual dental exams for proper oral hygiene  Community Resource Referral / Chronic Care Management: CRR required this visit?  No   CCM required this visit?  No      Plan:     I have personally reviewed and noted the following in the patient's chart:   Medical and social history Use of alcohol, tobacco or illicit drugs  Current medications and supplements including opioid prescriptions.  Functional ability and status Nutritional status Physical activity Advanced directives List of other physicians Hospitalizations, surgeries, and ER visits in previous 12 months Vitals Screenings to include cognitive, depression, and falls Referrals and appointments  In addition, I have reviewed and discussed with patient certain preventive protocols, quality metrics, and best practice recommendations. A written personalized care plan for preventive services as well as general preventive health recommendations were provided to patient.     Dionisio David, LPN   [id number]/[id number]   Nurse Notes: none

## 2022-07-02 NOTE — Patient Instructions (Signed)
Teresa [name] , Thank you for taking time to come for your Medicare Wellness Visit. I appreciate your ongoing commitment to your health goals. Please review the following plan we discussed and let me know if I can assist you in the future.   Screening recommendations/referrals: Colonoscopy: 08/07/17 Mammogram: 04/18/22 Bone Density: 09/30/17 Recommended yearly ophthalmology/optometry visit for glaucoma screening and checkup Recommended yearly dental visit for hygiene and checkup  Vaccinations: Influenza vaccine: 09/04/21 Pneumococcal vaccine: 10/06/16 Tdap vaccine: 10/11/20 Shingles vaccine: n/d   Covid-19:02/02/20, 03/01/20, 11/09/20, 05/31/21  Advanced directives: yes  Conditions/risks identified: no  Next appointment: Follow up in one year for your annual wellness visit 07/06/23 @ 2 pm by phone   Preventive Care 65 Years and Older, Female Preventive care refers to lifestyle choices and visits with your health care provider that can promote health and wellness. What does preventive care include? A yearly physical exam. This is also called an annual well check. Dental exams once or twice a year. Routine eye exams. Ask your health care provider how often you should have your eyes checked. Personal lifestyle choices, including: Daily care of your teeth and gums. Regular physical activity. Eating a healthy diet. Avoiding tobacco and drug use. Limiting alcohol use. Practicing safe sex. Taking low-dose aspirin every day. Taking vitamin and mineral supplements as recommended by your health care provider. What happens during an annual well check? The services and screenings done by your health care provider during your annual well check will depend on your age, overall health, lifestyle risk factors, and family history of disease. Counseling  Your health care provider may ask you questions about your: Alcohol use. Tobacco use. Drug use. Emotional well-being. Home and relationship  well-being. Sexual activity. Eating habits. History of falls. Memory and ability to understand (cognition). Work and work Statistician. Reproductive health. Screening  You may have the following tests or measurements: Height, weight, and BMI. Blood pressure. Lipid and cholesterol levels. These may be checked every 5 years, or more frequently if you are over 61 years old. Skin check. Lung cancer screening. You may have this screening every year starting at age 31 if you have a 30-pack-year history of smoking and currently smoke or have quit within the past 15 years. Fecal occult blood test (FOBT) of the stool. You may have this test every year starting at age 76. Flexible sigmoidoscopy or colonoscopy. You may have a sigmoidoscopy every 5 years or a colonoscopy every 10 years starting at age 39. Hepatitis C blood test. Hepatitis B blood test. Sexually transmitted disease (STD) testing. Diabetes screening. This is done by checking your blood sugar (glucose) after you have not eaten for a while (fasting). You may have this done every 1-3 years. Bone density scan. This is done to screen for osteoporosis. You may have this done starting at age 47. Mammogram. This may be done every 1-2 years. Talk to your health care provider about how often you should have regular mammograms. Talk with your health care provider about your test results, treatment options, and if necessary, the need for more tests. Vaccines  Your health care provider may recommend certain vaccines, such as: Influenza vaccine. This is recommended every year. Tetanus, diphtheria, and acellular pertussis (Tdap, Td) vaccine. You may need a Td booster every 10 years. Zoster vaccine. You may need this after age 38. Pneumococcal 13-valent conjugate (PCV13) vaccine. One dose is recommended after age 64. Pneumococcal polysaccharide (PPSV23) vaccine. One dose is recommended after age 57. Talk to your health  care provider about which  screenings and vaccines you need and how often you need them. This information is not intended to replace advice given to you by your health care provider. Make sure you discuss any questions you have with your health care provider. Document Released: 01/04/2016 Document Revised: 08/27/2016 Document Reviewed: 10/09/2015 Elsevier Interactive Patient Education  2017 [place] Prevention in the Home Falls can cause injuries. They can happen to people of all ages. There are many things you can do to make your home safe and to help prevent falls. What can I do on the outside of my home? Regularly fix the edges of walkways and driveways and fix any cracks. Remove anything that might make you trip as you walk through a door, such as a raised step or threshold. Trim any bushes or trees on the path to your home. Use bright outdoor lighting. Clear any walking paths of anything that might make someone trip, such as rocks or tools. Regularly check to see if handrails are loose or broken. Make sure that both sides of any steps have handrails. Any raised decks and porches should have guardrails on the edges. Have any leaves, snow, or ice cleared regularly. Use sand or salt on walking paths during winter. Clean up any spills in your garage right away. This includes oil or grease spills. What can I do in the bathroom? Use night lights. Install grab bars by the toilet and in the tub and shower. Do not use towel bars as grab bars. Use non-skid mats or decals in the tub or shower. If you need to sit down in the shower, use a plastic, non-slip stool. Keep the floor dry. Clean up any water that spills on the floor as soon as it happens. Remove soap buildup in the tub or shower regularly. Attach bath mats securely with double-sided non-slip rug tape. Do not have throw rugs and other things on the floor that can make you trip. What can I do in the bedroom? Use night lights. Make sure that you have a  light by your bed that is easy to reach. Do not use any sheets or blankets that are too big for your bed. They should not hang down onto the floor. Have a firm chair that has side arms. You can use this for support while you get dressed. Do not have throw rugs and other things on the floor that can make you trip. What can I do in the kitchen? Clean up any spills right away. Avoid walking on wet floors. Keep items that you use a lot in easy-to-reach places. If you need to reach something above you, use a strong step stool that has a grab bar. Keep electrical cords out of the way. Do not use floor polish or wax that makes floors slippery. If you must use wax, use non-skid floor wax. Do not have throw rugs and other things on the floor that can make you trip. What can I do with my stairs? Do not leave any items on the stairs. Make sure that there are handrails on both sides of the stairs and use them. Fix handrails that are broken or loose. Make sure that handrails are as long as the stairways. Check any carpeting to make sure that it is firmly attached to the stairs. Fix any carpet that is loose or worn. Avoid having throw rugs at the top or bottom of the stairs. If you do have throw rugs, attach them to  the floor with carpet tape. Make sure that you have a light switch at the top of the stairs and the bottom of the stairs. If you do not have them, ask someone to add them for you. What else can I do to help prevent falls? Wear shoes that: Do not have high heels. Have rubber bottoms. Are comfortable and fit you well. Are closed at the toe. Do not wear sandals. If you use a stepladder: Make sure that it is fully opened. Do not climb a closed stepladder. Make sure that both sides of the stepladder are locked into place. Ask someone to hold it for you, if possible. Clearly mark and make sure that you can see: Any grab bars or handrails. First and last steps. Where the edge of each step  is. Use tools that help you move around (mobility aids) if they are needed. These include: Canes. Walkers. Scooters. Crutches. Turn on the lights when you go into a dark area. Replace any light bulbs as soon as they burn out. Set up your furniture so you have a clear path. Avoid moving your furniture around. If any of your floors are uneven, fix them. If there are any pets around you, be aware of where they are. Review your medicines with your doctor. Some medicines can make you feel dizzy. This can increase your chance of falling. Ask your doctor what other things that you can do to help prevent falls. This information is not intended to replace advice given to you by your health care provider. Make sure you discuss any questions you have with your health care provider. Document Released: 10/04/2009 Document Revised: 05/15/2016 Document Reviewed: 01/12/2015 Elsevier Interactive Patient Education  2017 [employer].

## 2022-07-28 ENCOUNTER — Telehealth: Payer: Self-pay

## 2022-07-28 NOTE — Progress Notes (Signed)
Per Clinical pharmacist, please reschedule patient appointment on 08/05/2022.  Telephone follow up appointment with Care management team member scheduled for : 08/12/2022 at 11:00 am.  [facility] Pharmacist Assistant [phone]

## 2022-08-05 ENCOUNTER — Telehealth: Payer: Medicare HMO

## 2022-08-11 ENCOUNTER — Telehealth: Payer: Self-pay

## 2022-08-11 NOTE — Progress Notes (Signed)
Chronic Care Management  APPOINTMENT REMINDER   Teresa [name] was reminded to have all medications, supplements and any blood glucose and blood pressure readings available for review with [name], Pharm. D, at her telephone visit on 08/12/2022 at 11:00 am.   Patient daughter confirm appointment.  [facility] Pharmacist Assistant [phone]

## 2022-08-12 ENCOUNTER — Telehealth: Payer: Self-pay

## 2022-08-12 ENCOUNTER — Telehealth: Payer: Medicare HMO

## 2022-08-12 NOTE — Progress Notes (Signed)
Patient daughter states she needs to reschedule patient appointment today.  Telephone follow up appointment with Care management team member scheduled for : 08/19/2022 at 11:00 am.  [facility] Pharmacist Assistant [phone]

## 2022-08-19 ENCOUNTER — Telehealth: Payer: Self-pay

## 2022-08-19 ENCOUNTER — Telehealth: Payer: Medicare HMO

## 2022-08-19 NOTE — Progress Notes (Deleted)
Chronic Care Management Pharmacy Note  08/19/2022 Name:  Teresa [name] MRN:  [id number] DOB:  09-May-1941  Summary: Patient presents for CCM follow-up. Today's visit was conducted entirely with patient's daughter, Teresa [name].   Recommendations/Changes made from today's visit: Recommend increasing memantine to 10 mg twice daily and could consider starting donepezil 5 mg daily.   Plan: CPP follow-up in 3 months   Recommended Problem List Changes:  Add:  Alzheimer disease  Chronic systolic heart failure  Subjective: Teresa [name] is an 81 y.o. year old female who is a primary patient of Jerrol Banana., MD.  The CCM team was consulted for assistance with disease management and care coordination needs.    Engaged with patient by telephone for follow up visit in response to provider referral for pharmacy case management and/or care coordination services.   Consent to Services:  The patient was given information about Chronic Care Management services, agreed to services, and gave verbal consent prior to initiation of services.  Please see initial visit note for detailed documentation.   Patient Care Team: Jerrol Banana., MD as PCP - General (Family Medicine) Wellington Hampshire, MD as PCP - Cardiology (Cardiology) Dasher, Rayvon Char, MD as Consulting Physician (Dermatology) Blanche East, MD as Consulting Physician (Neurosurgery) Estevan Ryder, MD as Referring Physician (Cardiology) Troxler, [place]man [place], [place] (Inactive) as Attending Physician (Podiatry) Germaine Pomfret, [facility] as Pharmacist (Pharmacist) Quintin Alto, MD as Consulting Physician (Rheumatology) Diamond Nickel, DO as Consulting Physician (Sports Medicine) Marchia Meiers, MD (Inactive) as Consulting Physician (Ophthalmology)  Recent office visits: 09/04/21: patient presented to Dr. [name] for follow-up.   Recent consult visits: 10/29/21: Patient presented to Lynann Bologna, PA (Cardiology) for  follow-up.  10/25/21: Patient presented to Dr. Werner Lean (Neurology) for Alzheimer consuklt. Memantinestarted, titrate up to 10 mg twice daily.  05/31/21: Patient presented to Dr. Ron Parker (Cardiology) for follow-up. No medication chagnes made.  04/09/21: Patient presented to Dr. Posey Pronto (Rheumatology) for follow-up.  Hospital visits: None in previous 6 months   Objective:  Lab Results  Component Value Date   CREATININE 0.89 09/06/2021   BUN 9 09/06/2021   GFRNONAA >60 07/12/2021   GFRAA 69 02/16/2019   NA 145 (H) 09/06/2021   K 5.0 09/06/2021   CALCIUM 9.8 09/06/2021   CO2 23 09/06/2021   GLUCOSE 98 09/06/2021    Lab Results  Component Value Date/Time   HGBA1C 5.5 09/06/2021 10:02 AM   HGBA1C 5.8 (A) 11/26/2020 01:33 PM   HGBA1C 5.8 (H) 05/30/2020 03:18 PM    Last diabetic Eye exam: No results found for: "HMDIABEYEEXA"  Last diabetic Foot exam: No results found for: "HMDIABFOOTEX"   Lab Results  Component Value Date   CHOL 162 09/06/2021   HDL 71 09/06/2021   LDLCALC 79 09/06/2021   TRIG 59 09/06/2021   CHOLHDL 2.3 09/06/2021       Latest Ref Rng & Units 09/06/2021   10:02 AM 07/02/2021    2:47 PM 02/16/2019    3:30 PM  Hepatic Function  Total Protein 6.0 - 8.5 g/dL 6.5  6.7  6.7   Albumin 3.7 - 4.7 g/dL 4.3  3.8  4.3   AST 0 - 40 IU/L _0 ALT 0 - 32 IU/L _1 Alk Phosphatase 44 - 121 IU/L 98  64  103   Total Bilirubin 0.0 - 1.2 mg/dL 0.3  0.7  0.6  Lab Results  Component Value Date/Time   TSH 1.250 09/06/2021 10:02 AM   TSH 2.130 05/30/2020 03:18 PM   FREET4 0.93 01/13/2018 11:40 AM       Latest Ref Rng & Units 09/06/2021   10:02 AM 07/13/2021    5:50 AM 07/12/2021    4:31 AM  CBC  WBC 3.4 - 10.8 x10E3/uL 5.7  6.6  6.8   Hemoglobin 11.1 - 15.9 g/dL 13.7  10.2  12.9   Hematocrit 34.0 - 46.6 % 41.7  31.8  38.2   Platelets 150 - 450 x10E3/uL 270  173  225     No results found for: "VD25OH"  Clinical ASCVD: Yes  The ASCVD Risk score  (Arnett DK, et al., 2019) failed to calculate for the following reasons:   The 2019 ASCVD risk score is only valid for ages 64 to 36   The patient has a prior MI or stroke diagnosis       07/02/2022    1:33 PM 04/23/2022    3:01 PM 01/10/2021    2:42 PM  Depression screen PHQ 2/9  Decreased Interest 0 0 0  Down, Depressed, Hopeless 0 0 0  PHQ - 2 Score 0 0 0  Altered sleeping  0   Tired, decreased energy  0   Change in appetite  0   Feeling bad or failure about yourself   0   Trouble concentrating  0   Moving slowly or fidgety/restless  0   Suicidal thoughts  0   PHQ-9 Score  0   Difficult doing work/chores  Not difficult at all     Social History   Tobacco Use  Smoking Status Former   Packs/day: 0.50   Years: 35.00   Total pack years: 17.50   Types: Cigarettes   Quit date: 1991   Years since quitting: 32.6  Smokeless Tobacco Never  Tobacco Comments   Quit approx 1990   BP Readings from Last 3 Encounters:  04/23/22 (!) 101/50  09/04/21 124/74  07/16/21 (!) 119/37   Pulse Readings from Last 3 Encounters:  04/23/22 (!) 53  09/04/21 78  07/16/21 66   Wt Readings from Last 3 Encounters:  07/02/22 175 lb (79.4 kg)  04/23/22 175 lb 4.8 oz (79.5 kg)  09/04/21 171 lb (77.6 kg)   BMI Readings from Last 3 Encounters:  07/02/22 30.04 kg/m  04/23/22 30.09 kg/m  09/04/21 29.35 kg/m    Assessment/Interventions: Review of patient past medical history, allergies, medications, health status, including review of consultants reports, laboratory and other test data, was performed as part of comprehensive evaluation and provision of chronic care management services.   SDOH:  (Social Determinants of Health) assessments and interventions performed: Yes  SDOH Screenings   Alcohol Screen: Low Risk  (07/02/2022)   Alcohol Screen    Last Alcohol Screening Score (AUDIT): 0  Depression (PHQ2-9): Low Risk  (07/02/2022)   Depression (PHQ2-9)    PHQ-2 Score: 0  Financial Resource  Strain: Low Risk  (07/02/2022)   Overall Financial Resource Strain (CARDIA)    Difficulty of Paying Living Expenses: Not hard at all  Food Insecurity: No Food Insecurity (07/02/2022)   Hunger Vital Sign    Worried About Running Out of Food in the Last Year: Never true    Ran Out of Food in the Last Year: Never true  Housing: Low Risk  (07/02/2022)   Housing    Last Housing Risk Score: 0  Physical Activity: Unknown (07/02/2022)  Exercise Vital Sign    Days of Exercise per Week: Patient refused    Minutes of Exercise per Session: Patient refused  Social Connections: Moderately Isolated (07/02/2022)   Social Connection and Isolation Panel [NHANES]    Frequency of Communication with Friends and Family: More than three times a week    Frequency of Social Gatherings with Friends and Family: Once a week    Attends Religious Services: More than 4 times per year    Active Member of [employer] or Organizations: No    Attends Archivist Meetings: Never    Marital Status: Divorced  Stress: No Stress Concern Present (07/02/2022)   [employer]    Feeling of Stress : Not at all  Tobacco Use: Medium Risk (07/02/2022)   Patient History    Smoking Tobacco Use: Former    Smokeless Tobacco Use: Never    Passive Exposure: Not on file  Transportation Needs: No Transportation Needs (07/02/2022)   PRAPARE - Hydrologist (Medical): No    Lack of Transportation (Non-Medical): No    CCM Care Plan  Allergies  Allergen Reactions   Sulfa Antibiotics Hives   Ivp Dye [Iodinated Contrast Media] Swelling   Amiodarone Nausea And Vomiting and Cough   Codeine Other (See Comments)    ALTERED MENTAL STATUS    Medications Reviewed Today     Reviewed by Dionisio David, LPN (Licensed Practical Nurse) on 07/02/22 at 5  Med List Status: <None>   Medication Order Taking? Sig Documenting Provider Last Dose Status  Informant  acetaminophen (TYLENOL) 650 MG CR tablet [id number] Yes Take 650 mg by mouth every 8 (eight) hours as needed for pain. [provider] Taking Active Self  amoxicillin (AMOXIL) 500 MG capsule [id number] No SMARTSIG:4 Capsule(s) By Mouth  Patient not taking: Reported on 07/02/2022   [provider] Not Taking Active   apixaban (ELIQUIS) 5 MG TABS tablet [id number] Yes Take 1 tablet (5 mg total) by mouth 2 (two) times daily. Wellington Hampshire, MD Taking Active Self  budesonide-formoterol [facility]) 160-4.5 MCG/ACT inhaler [id number] Yes Inhale 2 puffs into the lungs 2 (two) times daily. Jerrol Banana., MD Taking Active            Med Note Kellie Simmering, Jenene Slicker   Fri Apr 12, 2021 10:26 AM)    cetirizine (ZYRTEC) 10 MG tablet [id number] Yes TAKE 1 TABLET BY MOUTH EVERY DAY Myles Gip, DO Taking Active   docusate sodium (COLACE) 100 MG capsule [id number] Yes Take 1 capsule (100 mg total) by mouth 2 (two) times daily. Duanne Guess, PA-C Taking Active   donepezil (ARICEPT) 5 MG tablet [id number] Yes TAKE 1 TABLET BY MOUTH EVERYDAY AT BEDTIME Jerrol Banana., MD Taking Active   fluticasone [facility]) 50 MCG/ACT nasal spray [id number] Yes PLACE 2 SPRAYS INTO BOTH NOSTRILS DAILY AS NEEDED FOR ALLERGIES. Jerrol Banana., MD Taking Active   furosemide (LASIX) 20 MG tablet [id number] Yes TAKE 1 TABLET (20 MG TOTAL) BY MOUTH DAILY AS NEEDED (SHORTNESS OF BREATH). Minna Merritts, MD Taking Active Self           Med Note Ivor Reining Jun 27, 2021  1:42 PM)    gabapentin (NEURONTIN) 300 MG capsule [id number] Yes TAKE TWO TABS BY MOUTH IN THE AM, TWO TABLETS BY MOUTH AT BEDTIME, AND 1 TABLET AT DINNER AS NEEDED.  Patient  taking differently: 300 mg. 1 capsule in the morning, 2 capsules in the evening   Jerrol Banana., MD Taking Active   HYDROcodone-acetaminophen (NORCO/VICODIN) 5-325 MG tablet [id number] Yes Take 1-2 tablets by mouth every 4 (four)  hours as needed for moderate pain (pain score 4-6). Duanne Guess, PA-C Taking Active   memantine [facility]) 10 MG tablet [id number] Yes Take 10 mg by mouth in the morning and at bedtime. [provider] Taking Active   methocarbamol (ROBAXIN) 500 MG tablet [id number] Yes Take 1 tablet (500 mg total) by mouth every 6 (six) hours as needed for muscle spasms. Duanne Guess, PA-C Taking Active   metoprolol succinate (TOPROL-XL) 50 MG 24 hr tablet [id number] Yes Take by mouth. [provider] Taking Active   MODERNA COVID-19 BIVALENT 50 MCG/0.5ML injection [id number] Yes  [provider] Taking Active   Multiple Vitamin (MULTIVITAMIN WITH MINERALS) TABS tablet [id number] Yes Take 1 tablet by mouth daily. [provider] Taking Active   OTEZLA 30 MG TABS [id number] Yes Take 30 mg by mouth in the morning and at bedtime. [provider] Taking Active Self  rosuvastatin (CRESTOR) 5 MG tablet [id number] Yes TAKE 1 TABLET BY MOUTH EVERY DAY Jerrol Banana., MD Taking Active   sacubitril-valsartan [facility]) 24-26 MG [id number] Yes Take 1 tablet by mouth 2 (two) times daily. [provider] Taking Active   traMADol (ULTRAM) 50 MG tablet [id number] Yes Take 1 tablet (50 mg total) by mouth every 6 (six) hours as needed. Duanne Guess, PA-C Taking Active   venlafaxine [facility]) 75 MG tablet [id number] Yes TAKE 2 TABLETS (150 MG TOTAL) BY MOUTH DAILY. Jerrol Banana., MD Taking Active   vitamin B-12 (CYANOCOBALAMIN) 1000 MCG tablet [id number] Yes Take 1,000 mcg by mouth daily. [provider] Taking Active             Patient Active Problem List   Diagnosis Date Noted   Anticoagulated 07/01/2022   Paroxysmal atrial fibrillation ([place]) 07/01/2022   Status post catheter ablation of atrial fibrillation 07/01/2022   Status post total hip replacement, right 07/11/2021   Need for prophylactic vaccination against rabies 10/11/2020    Puncture wound 10/11/2020   Need for Tdap vaccination 10/11/2020   Dog bite of right lower leg with infection 10/11/2020   Neuropathy 05/02/2020   Chronic obstructive pulmonary disease ([place]) 03/11/2019   Atrial fibrillation ([place]) 11/11/2018   A-fib ([place]) 10/12/2018   Rheumatoid arthritis ([place]) 10/10/2018   Atrial fibrillation with RVR ([place]) 10/10/2018   Fusion of spine of cervical region 09/25/2018   Psoriatic arthritis ([place]) 03/04/2018   Unstable angina (H[facility])    Atrial fibrillation with rapid ventricular response ([place]) 01/13/2018   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   Asthma 05/09/2017   Primary osteoarthritis of both first carpometacarpal joints 01/31/2016   Arthralgia of both hands 01/31/2016   Ventricular premature depolarization 09/11/2015   Psoriasis 07/16/2015   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   Hyperlipidemia 03/29/2015   Pre-syncope 03/04/2015   Syncope and collapse 03/04/2015   CAD (coronary artery disease)    PVC's (premature ventricular contractions)    Ischemic cardiomyopathy    DDD (degenerative disc disease), lumbar 01/18/2015   Intervertebral disc disorder with radiculopathy of lumbar region 12/26/2014   L-S radiculopathy 07/03/2014   Breast CA ([place]) 06/28/2014   Arthritis, degenerative 06/28/2014   Unspecified osteoarthritis, unspecified site 06/28/2014   Atherosclerotic heart disease of native coronary artery without angina pectoris 06/28/2014  Acid reflux 03/08/2014   Malignant neoplasm of female breast ([place]) 03/08/2014   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    Immunization History  Administered Date(s) Administered   Fluad Quad(high Dose 65+) 09/12/2020, 09/04/2021   Influenza Whole 08/22/2018   Influenza, High Dose Seasonal PF 10/04/2015, 10/06/2016, 09/16/2017, 08/31/2018, 10/07/2019   Influenza-Unspecified 07/22/2014   Moderna Sars-Covid-2 Vaccination 02/02/2020, 03/01/2020, 11/09/2020   PFIZER Comirnaty(Gray  Top)Covid-19 Tri-Sucrose Vaccine 05/31/2021   Pneumococcal Conjugate-13 10/06/2016   Pneumococcal Polysaccharide-23 11/13/2009   Tdap 10/11/2020    Conditions to be addressed/monitored:  Hypertension, Atrial Fibrillation, Heart Failure, Coronary Artery Disease, GERD, COPD, Asthma, Osteoarthritis, and Psoriatic Arthritis and Insomnia  There are no care plans that you recently modified to display for this patient.     Medication Assistance: None required.  Patient affirms current coverage meets needs.  Compliance/Adherence/Medication fill history: Care Gaps: Shingrix  Star-Rating Drugs: Rosuvastatin 5 mg last filled on 03/22/22 for [facility] day supply at [facility].  Patient's preferred [facility]  [id number] [place] [address] [place]Phone: [phone]: [phone] [id number] [place] [address]: [phone]: [id number]: [phone]: [phone] pill box? Yes Pt endorses 100% compliance  We discussed: Current pharmacy is preferred with insurance plan and patient is satisfied with pharmacy services Patient decided to: Continue current medication management strategy  Care Plan and Follow Up Patient Decision:  Patient agrees to Care Plan and Follow-up.  Plan: Telephone follow up appointment with care management team member scheduled for:  08/05/2022 at 3:45 PM   AleJunius ArgyleharmD, [phone] Barriers:  No barriers noted  Pharmacist Clinical Goal(s):  Patient will maintain control of heart failure as evidenced by stable weight; no heart failure exacerbations  through collaboration with PharmD and provider.   Interventions: 1:1 collaboration with GilJerrol Banana MD regarding development and update of comprehensive plan of care as evidenced by provider attestation and co-signature Inter-disciplinary care team collaboration (see longitudinal plan of care) Comprehensive medication review performed; medication list updated in electronic medical record  Atrial Fibrillation (Goal: prevent stroke and major bleeding) -Controlled -Current treatment: Rate control: Metoprolol XL 50 mg 1/2 tablet twice daily  Anticoagulation: Eliquis 5 mg twice daily  -Medications previously tried: NA -Counseled on avoidance of NSAIDs due to increased bleeding risk with anticoagulants; -Recommended to continue current medication  Heart Failure (Goal: manage symptoms and prevent exacerbations) -Controlled -Last ejection fraction: 45%56%F type: Systolic -NYHA Class: II (slight limitation of activity) -AHA HF Stage: C (Heart disease and symptoms present) -Current treatment: Entresto 24-26 mg twice daily: Appropriate, Effective, Safe, Accessible Furosemide 20 mg daily as needed: Appropriate, Effective, Safe, Accessible  Metoprolol XL 50 mg 1/2 tablet twice daily: Appropriate, Effective, Safe, Accessible  -Medications previously tried: NA -Entresto was stopped after surgery due to hypotension, TerHelene Kelpports she was instructed to have the patient resume taking this medication. Denies further instances of hypotension in that time.  -Recommended to continue current medication  Hyperlipidemia: (LDL goal < 100) -Controlled -Current treatment: Rosuvastatin 5 mg daily  -Medications previously tried: NA  -Recommended to continue current medication  COPD/Asthma (Goal: control symptoms and prevent exacerbations) -Controlled -Current treatment  Symbicort 2 puffs twice daily  Montelukast 10 mg daily as needed  -Current allergy treatment  Cetirizine 10 mg daily as needed  Flonase 50 mcg/act 2 sprays daily as needed  -Medications previously tried: NA  In the past four weeks,  has the patient had:  Daytime asthma symptoms more than twice weekly?   {yes/no:20286::"No"} Any instance of night waking due to asthma?   {yes/no:20286::"No"} Utilized albuterol reliever for symptoms more than twice weekly? {yes/no:20286::"No"} Any activity limitation due to asthma?    {yes/no:20286::"No"} -Recommended to continue current medication  Alzheimer's Disease (Goal: prevent worsening memory) -Not ideally controlled -managed by Dr. Werner Lean -Current treatment  Donepezil 5 mg nightly: Appropriate, Effective, Safe, Accessible Memantine 10 mg twice daily: Appropriate, Effective, Safe, Accessible Venlafaxine 75 mg 2 tablets daily  -Medications previously tried: NA  -Recommended to continue current medication  *** (Goal: ***) -{US controlled/uncontrolled:25276} -Psoriatic arthritis, lumbar radiculopathy, neuropathy  -Current treatment  Acetaminophen 650 mg CR  Gabapentin 300 mg 2 caps AM, 1 tab PM, 2 tab HS  Hydrocodone-APAP Methocarbamol 500 mg every 6 hours as needed  Tramadol 50 mg every 6 hours as needed  Otezla 30 mg twice daily  -Medications previously tried: ***  -{CCMPHARMDINTERVENTION:25122}   Goals/Self-Care Activities Patient will:  - check blood pressure 2-3 times weekly, document, and provide at future appointments weigh daily, and contact provider if weight gain of greater than 2 pounds  Follow Up Plan: ***

## 2022-08-19 NOTE — Progress Notes (Signed)
Patient daughter states she needs reschedule appointment with the clinical pharmacist on 08/19/2022.  Telephone follow up appointment with Care management team member scheduled for : 09/16/2022 at 3:45 pm.  [place] Pharmacist Assistant [phone]

## 2022-08-27 ENCOUNTER — Telehealth: Payer: Self-pay

## 2022-08-27 NOTE — Patient Outreach (Signed)
  Care Coordination   08/27/2022 Name: Teresa [name] MRN: [id number] DOB: 1941-07-07   Care Coordination Outreach Attempts:  An unsuccessful telephone outreach was attempted today to offer the patient information about available care coordination services as a benefit of their health plan.   Follow Up Plan:  Additional outreach attempts will be made to offer the patient care coordination information and services.   Encounter Outcome:  No Answer  Care Coordination Interventions Activated:  No   Care Coordination Interventions:  No, not indicated    Noreene Larsson RN, MSN, CCM Community Care Coordinator [place] [facility] Mobile: [phone]

## 2022-08-28 DIAGNOSIS — M4807 Spinal stenosis, lumbosacral region: Secondary | ICD-10-CM | POA: Diagnosis not present

## 2022-08-29 ENCOUNTER — Ambulatory Visit: Payer: Self-pay

## 2022-08-29 NOTE — Patient Outreach (Signed)
  Care Coordination   Initial Visit Note   08/29/2022 Name: Teresa [name] MRN: [id number] DOB: [birth year]  Teresa [name] is a 81 y.o. year old female who sees Jerrol Banana., MD for primary care. I spoke with  Otho Bellows by phone today.  What matters to the patients health and wellness today?  Why she is having so much back pain and discomfort    Goals Addressed   None     SDOH assessments and interventions completed:  Yes  SDOH Interventions Today    Flowsheet Row Most Recent Value  SDOH Interventions   Utilities Interventions Intervention Not Indicated  Physical Activity Interventions Other (Comments)  [patient unable to activity/exercise due to back pain and discomfort]        Care Coordination Interventions Activated:  Yes  Care Coordination Interventions:  Yes, provided   Follow up plan: Follow up call scheduled for 10-31-2022 at 1 pm    Encounter Outcome:  Pt. Visit Completed   Noreene Larsson RN, MSN, [place] [facility] Mobile: [phone]

## 2022-08-29 NOTE — Patient Instructions (Signed)
Visit Information  Thank you for taking time to visit with me today. Please don't hesitate to contact me if I can be of assistance to you.   Following are the goals we discussed today:   Goals Addressed             This Visit's Progress    RNCM: Effective Management of pain       Care Coordination Interventions: Rates her back pain at an 8 today on a scale of 0-10 Reviewed provider established plan for pain management. The patient saw specialist yesterday and they are scheduling her for an MRI. She waiting for them to call and schedule her MRI. The patient had back surgery when she was in her 24's. She is hopeful they will be able to tell her what is going on with her back Discussed importance of adherence to all scheduled medical appointments. Pcp appointment on 09-03-2022 at 120 pm Counseled on the importance of reporting any/all new or changed pain symptoms or management strategies to pain management provider Advised patient to report to care team affect of pain on daily activities. Unable to do a lot of activity due to back pain, review of being safe and preventing falls. Will continue to monitor Discussed use of relaxation techniques and/or diversional activities to assist with pain reduction (distraction, imagery, relaxation, massage, acupressure, TENS, heat, and cold application Reviewed with patient prescribed pharmacological and nonpharmacological pain relief strategies Advised patient to discuss unresolved pain, changes in level or intensity of pain with provider Screening for signs and symptoms of depression related to chronic disease state  Assessed social determinant of health barriers 08-29-2022: The patient agrees to regular outreaches from the [facility]. The patient has the [facility] contact information.  Review of the goals of the care coordination program.  Active listening / Reflection utilized  Emotional Support Provided         Our next appointment is by telephone on  10-31-2022  at 1 pm  Please call the care guide team at [phone] if you need to cancel or reschedule your appointment.   If you are experiencing a Mental Health or [place] or need someone to talk to, please call the Suicide and Crisis Lifeline: 988 call the [place] National Suicide Prevention Lifeline: (209)326-7967 or TTY: (551) 884-8323 TTY 763-433-6266) to talk to a trained counselor call 1-800-273-TALK (toll free, 24 hour hotline)  Patient verbalizes understanding of instructions and care plan provided today and agrees to view in [place]. Active MyChart status and patient understanding of how to access instructions and care plan via MyChart confirmed with patient.     Telephone follow up appointment with care management team member scheduled for: 10-31-2022 at 1 pm  [place], MSN, [place] [facility] Mobile: [phone]

## 2022-09-02 ENCOUNTER — Other Ambulatory Visit: Payer: Self-pay | Admitting: Orthopedic Surgery

## 2022-09-02 DIAGNOSIS — M4807 Spinal stenosis, lumbosacral region: Secondary | ICD-10-CM

## 2022-09-03 ENCOUNTER — Encounter: Payer: Self-pay | Admitting: Family Medicine

## 2022-09-03 ENCOUNTER — Ambulatory Visit (INDEPENDENT_AMBULATORY_CARE_PROVIDER_SITE_OTHER): Payer: Medicare HMO | Admitting: Family Medicine

## 2022-09-03 VITALS — BP 143/52 | HR 60 | Resp 16 | Wt 165.0 lb

## 2022-09-03 DIAGNOSIS — E785 Hyperlipidemia, unspecified: Secondary | ICD-10-CM | POA: Diagnosis not present

## 2022-09-03 DIAGNOSIS — I5022 Chronic systolic (congestive) heart failure: Secondary | ICD-10-CM | POA: Diagnosis not present

## 2022-09-03 DIAGNOSIS — I10 Essential (primary) hypertension: Secondary | ICD-10-CM

## 2022-09-03 DIAGNOSIS — Z23 Encounter for immunization: Secondary | ICD-10-CM | POA: Diagnosis not present

## 2022-09-03 DIAGNOSIS — G3184 Mild cognitive impairment, so stated: Secondary | ICD-10-CM | POA: Diagnosis not present

## 2022-09-03 DIAGNOSIS — I255 Ischemic cardiomyopathy: Secondary | ICD-10-CM

## 2022-09-03 DIAGNOSIS — Z853 Personal history of malignant neoplasm of breast: Secondary | ICD-10-CM

## 2022-09-03 DIAGNOSIS — I4891 Unspecified atrial fibrillation: Secondary | ICD-10-CM | POA: Diagnosis not present

## 2022-09-03 DIAGNOSIS — R739 Hyperglycemia, unspecified: Secondary | ICD-10-CM

## 2022-09-03 DIAGNOSIS — M5136 Other intervertebral disc degeneration, lumbar region: Secondary | ICD-10-CM | POA: Diagnosis not present

## 2022-09-03 DIAGNOSIS — M51369 Other intervertebral disc degeneration, lumbar region without mention of lumbar back pain or lower extremity pain: Secondary | ICD-10-CM

## 2022-09-03 NOTE — Progress Notes (Signed)
Established patient visit  I,Teresa [name],acting as a scribe for Teresa Durie, MD.,have documented all relevant documentation on the behalf of Teresa Durie, MD,as directed by  Teresa Durie, MD while in the presence of Teresa Durie, MD.   Patient: Teresa [name]   DOB: [birth year]   81 y.o. Female  MRN: [id number] Visit Date: 09/03/2022  Today's healthcare provider: [name], MD   Chief Complaint  Patient presents with   MCI   Subjective    HPI  She comes in today for follow-up.  She feels well and has no complaints. She feels her memory is slowly worsening but she scores 30/30 on MMSE today. Overall her health is stable and she is taking medications as prescribed. Chronic Back pain which is stable.  She is followed by drip for spinal stenosis  Follow up for MCI (mild cognitive impairment):  The patient was last seen for this 4 months ago. Changes made at last visit include; Clinically stable. MMSE on next visit.  She reports good compliance with treatment. She feels that condition is Unchanged. She is not having side effects.   -----------------------------------------------------------------------------------------   Medications: Outpatient Medications Prior to Visit  Medication Sig   acetaminophen (TYLENOL) 650 MG CR tablet Take 650 mg by mouth every 8 (eight) hours as needed for pain.   apixaban (ELIQUIS) 5 MG TABS tablet Take 1 tablet (5 mg total) by mouth 2 (two) times daily.   budesonide-formoterol (SYMBICORT) 160-4.5 MCG/ACT inhaler Inhale 2 puffs into the lungs 2 (two) times daily.   cetirizine (ZYRTEC) 10 MG tablet TAKE 1 TABLET BY MOUTH EVERY DAY   docusate sodium (COLACE) 100 MG capsule Take 1 capsule (100 mg total) by mouth 2 (two) times daily.   donepezil (ARICEPT) 5 MG tablet TAKE 1 TABLET BY MOUTH EVERYDAY AT BEDTIME   fluticasone (FLONASE) 50 MCG/ACT nasal spray PLACE 2 SPRAYS INTO BOTH NOSTRILS DAILY AS NEEDED FOR  ALLERGIES.   furosemide (LASIX) 20 MG tablet TAKE 1 TABLET (20 MG TOTAL) BY MOUTH DAILY AS NEEDED (SHORTNESS OF BREATH).   gabapentin (NEURONTIN) 300 MG capsule TAKE TWO TABS BY MOUTH IN THE AM, TWO TABLETS BY MOUTH AT BEDTIME, AND 1 TABLET AT DINNER AS NEEDED. (Patient taking differently: 300 mg. 1 capsule in the morning, 2 capsules in the evening)   memantine (NAMENDA) 10 MG tablet Take 10 mg by mouth in the morning and at bedtime.   methocarbamol (ROBAXIN) 500 MG tablet Take 1 tablet (500 mg total) by mouth every 6 (six) hours as needed for muscle spasms.   metoprolol succinate (TOPROL-XL) 50 MG 24 hr tablet Take by mouth.   MODERNA COVID-19 BIVALENT 50 MCG/0.5ML injection    Multiple Vitamin (MULTIVITAMIN WITH MINERALS) TABS tablet Take 1 tablet by mouth daily.   OTEZLA 30 MG TABS Take 30 mg by mouth in the morning and at bedtime.   rosuvastatin (CRESTOR) 5 MG tablet TAKE 1 TABLET BY MOUTH EVERY DAY   sacubitril-valsartan (ENTRESTO) 24-26 MG Take 1 tablet by mouth 2 (two) times daily.   traMADol (ULTRAM) 50 MG tablet Take 1 tablet (50 mg total) by mouth every 6 (six) hours as needed.   venlafaxine (EFFEXOR) 75 MG tablet TAKE 2 TABLETS (150 MG TOTAL) BY MOUTH DAILY.   vitamin B-12 (CYANOCOBALAMIN) 1000 MCG tablet Take 1,000 mcg by mouth daily.   [DISCONTINUED] amoxicillin (AMOXIL) 500 MG capsule SMARTSIG:4 Capsule(s) By Mouth (Patient not taking: Reported on 07/02/2022)   [  DISCONTINUED] HYDROcodone-acetaminophen (NORCO/VICODIN) 5-325 MG tablet Take 1-2 tablets by mouth every 4 (four) hours as needed for moderate pain (pain score 4-6). (Patient not taking: Reported on 09/03/2022)   No facility-administered medications prior to visit.    Review of Systems  Constitutional:  Negative for appetite change, chills, fatigue and fever.  Respiratory:  Negative for chest tightness and shortness of breath.   Cardiovascular:  Negative for chest pain and palpitations.  Gastrointestinal:  Negative for  abdominal pain, nausea and vomiting.  Neurological:  Negative for dizziness and weakness.    Last hemoglobin A1c Lab Results  Component Value Date   HGBA1C 6.0 (H) 09/03/2022       Objective    BP (!) 143/52 (BP Location: Right Arm, Patient Position: Sitting, Cuff Size: Normal)   Pulse 60   Resp 16   Wt 165 lb (74.8 kg)   SpO2 100%   BMI 28.32 kg/m  BP Readings from Last 3 Encounters:  09/03/22 (!) 143/52  04/23/22 (!) 101/50  09/04/21 124/74   Wt Readings from Last 3 Encounters:  09/03/22 165 lb (74.8 kg)  07/02/22 175 lb (79.4 kg)  04/23/22 175 lb 4.8 oz (79.5 kg)      Physical Exam Vitals reviewed.  Constitutional:      General: She is not in acute distress.    Appearance: She is well-developed.  HENT:     Head: Normocephalic and atraumatic.     Right Ear: Hearing normal.     Left Ear: Hearing normal.     Nose: Nose normal.  Eyes:     General: Lids are normal. No scleral icterus.       Right eye: No discharge.        Left eye: No discharge.     Conjunctiva/sclera: Conjunctivae normal.  Cardiovascular:     Rate and Rhythm: Normal rate and regular rhythm.     Heart sounds: Normal heart sounds.  Pulmonary:     Effort: Pulmonary effort is normal. No respiratory distress.  Skin:    Findings: No lesion or rash.  Neurological:     General: No focal deficit present.     Mental Status: She is alert and oriented to person, place, and time.  Psychiatric:        Mood and Affect: Mood normal.        Speech: Speech normal.        Behavior: Behavior normal.        Thought Content: Thought content normal.        Judgment: Judgment normal.       No results found for any visits on 09/03/22.  Assessment & Plan     1. MCI (mild cognitive impairment) MMSE today is 30/30. - Lipid panel - TSH - CBC w/Diff/Platelet - Comprehensive Metabolic Panel (CMET) - Hemoglobin A1c  2. Atrial fibrillation with RVR (HCC)  - Lipid panel - TSH - CBC w/Diff/Platelet -  Comprehensive Metabolic Panel (CMET) - Hemoglobin A1c  3. Chronic systolic heart failure (HCC) On Entresto - Lipid panel - TSH - CBC w/Diff/Platelet - Comprehensive Metabolic Panel (CMET) - Hemoglobin A1c  4. DDD (degenerative disc disease), lumbar/spinal stenosis  - Lipid panel - TSH - CBC w/Diff/Platelet - Comprehensive Metabolic Panel (CMET) - Hemoglobin A1c  5. Hyperlipidemia, unspecified hyperlipidemia type On rosuvastatin 5 - Lipid panel - TSH - CBC w/Diff/Platelet - Comprehensive Metabolic Panel (CMET) - Hemoglobin A1c  6. Hyperglycemia  - Lipid panel - TSH - CBC w/Diff/Platelet - Comprehensive  Metabolic Panel (CMET) - Hemoglobin A1c  7. Essential hypertension  - Lipid panel - TSH - CBC w/Diff/Platelet - Comprehensive Metabolic Panel (CMET) - Hemoglobin A1c  8. Need for immunization against influenza  - Flu Vaccine QUAD High Dose(Fluad)   No follow-ups on file.      I, Teresa Durie, MD, have reviewed all documentation for this visit. The documentation on 09/06/22 for the exam, diagnosis, procedures, and orders are all accurate and complete.     Tyler Cubit Cranford Mon, MD  [facility] [phone] (phone) [phone] (fax)  [facility]

## 2022-09-04 LAB — LIPID PANEL
Chol/HDL Ratio: 2 ratio (ref 0.0–4.4)
Cholesterol, Total: 154 mg/dL (ref 100–199)
HDL: 77 mg/dL (ref >39)
LDL Chol Calc (NIH): 64 mg/dL (ref 0–99)
Triglycerides: 64 mg/dL (ref 0–149)
VLDL Cholesterol Cal: 13 mg/dL (ref 5–40)

## 2022-09-04 LAB — CBC WITH DIFFERENTIAL/PLATELET
Basophils Absolute: 0.1 10*3/uL (ref 0.0–0.2)
Basos: 1 %
EOS (ABSOLUTE): 0.3 10*3/uL (ref 0.0–0.4)
Eos: 4 %
Hematocrit: 43.4 % (ref 34.0–46.6)
Hemoglobin: 13.9 g/dL (ref 11.1–15.9)
Immature Grans (Abs): 0 10*3/uL (ref 0.0–0.1)
Immature Granulocytes: 0 %
Lymphocytes Absolute: 1.9 10*3/uL (ref 0.7–3.1)
Lymphs: 28 %
MCH: 30.6 pg (ref 26.6–33.0)
MCHC: 32 g/dL (ref 31.5–35.7)
MCV: 96 fL (ref 79–97)
Monocytes Absolute: 0.8 10*3/uL (ref 0.1–0.9)
Monocytes: 11 %
Neutrophils Absolute: 3.8 10*3/uL (ref 1.4–7.0)
Neutrophils: 56 %
Platelets: 226 10*3/uL (ref 150–450)
RBC: 4.54 x10E6/uL (ref 3.77–5.28)
RDW: 13.1 % (ref 11.7–15.4)
WBC: 6.8 10*3/uL (ref 3.4–10.8)

## 2022-09-04 LAB — COMPREHENSIVE METABOLIC PANEL
ALT: 11 IU/L (ref 0–32)
AST: 19 IU/L (ref 0–40)
Albumin/Globulin Ratio: 2.2 (ref 1.2–2.2)
Albumin: 4.4 g/dL (ref 3.8–4.8)
Alkaline Phosphatase: 72 IU/L (ref 44–121)
BUN/Creatinine Ratio: 16 (ref 12–28)
BUN: 17 mg/dL (ref 8–27)
Bilirubin Total: 0.4 mg/dL (ref 0.0–1.2)
CO2: 26 mmol/L (ref 20–29)
Calcium: 9.2 mg/dL (ref 8.7–10.3)
Chloride: 104 mmol/L (ref 96–106)
Creatinine, Ser: 1.07 mg/dL — ABNORMAL HIGH (ref 0.57–1.00)
Globulin, Total: 2 g/dL (ref 1.5–4.5)
Glucose: 92 mg/dL (ref 70–99)
Potassium: 5.1 mmol/L (ref 3.5–5.2)
Sodium: 143 mmol/L (ref 134–144)
Total Protein: 6.4 g/dL (ref 6.0–8.5)
eGFR: 53 mL/min/{1.73_m2} — ABNORMAL LOW (ref >59)

## 2022-09-04 LAB — TSH: TSH: 0.896 u[IU]/mL (ref 0.450–4.500)

## 2022-09-04 LAB — HEMOGLOBIN A1C
Est. average glucose Bld gHb Est-mCnc: 126 mg/dL
Hgb A1c MFr Bld: 6 % — ABNORMAL HIGH (ref 4.8–5.6)

## 2022-09-15 ENCOUNTER — Other Ambulatory Visit: Payer: Self-pay | Admitting: Family Medicine

## 2022-09-15 ENCOUNTER — Ambulatory Visit
Admission: RE | Admit: 2022-09-15 | Discharge: 2022-09-15 | Disposition: A | Discharge status: Home / Self Care | Payer: Medicare HMO | Source: Ambulatory Visit | Attending: Orthopedic Surgery | Admitting: Orthopedic Surgery

## 2022-09-15 DIAGNOSIS — M545 Low back pain, unspecified: Secondary | ICD-10-CM | POA: Diagnosis not present

## 2022-09-15 DIAGNOSIS — M79604 Pain in right leg: Secondary | ICD-10-CM | POA: Diagnosis not present

## 2022-09-15 DIAGNOSIS — M48061 Spinal stenosis, lumbar region without neurogenic claudication: Secondary | ICD-10-CM | POA: Diagnosis not present

## 2022-09-15 DIAGNOSIS — M4807 Spinal stenosis, lumbosacral region: Secondary | ICD-10-CM

## 2022-09-16 ENCOUNTER — Telehealth: Payer: Medicare HMO

## 2022-09-16 NOTE — Progress Notes (Deleted)
Chronic Care Management Pharmacy Note  09/16/2022 Name:  Teresa [name] MRN:  [id number] DOB:  01-Jun-1941  Summary: Patient presents for CCM follow-up. Today's visit was conducted entirely with patient's daughter, Jenai Scaletta.   Recommendations/Changes made from today's visit:   Plan: CPP follow-up in 3 months   Recommended Problem List Changes:  Add:  Alzheimer disease  Chronic systolic heart failure  Subjective: [name] is an 81 y.o. year old female who is a primary patient of Jerrol Banana., MD.  The CCM team was consulted for assistance with disease management and care coordination needs.    Engaged with patient by telephone for follow up visit in response to provider referral for pharmacy case management and/or care coordination services.   Consent to Services:  The patient was given information about Chronic Care Management services, agreed to services, and gave verbal consent prior to initiation of services.  Please see initial visit note for detailed documentation.   Patient Care Team: Jerrol Banana., MD as PCP - General (Family Medicine) Wellington Hampshire, MD as PCP - Cardiology (Cardiology) Dasher, Rayvon Char, MD as Consulting Physician (Dermatology) Blanche East, MD as Consulting Physician (Neurosurgery) Estevan Ryder, MD as Referring Physician (Cardiology) Troxler, [place]man [place], [place] (Inactive) as Attending Physician (Podiatry) Germaine Pomfret, [facility] as Pharmacist (Pharmacist) Quintin Alto, MD as Consulting Physician (Rheumatology) Diamond Nickel, DO as Consulting Physician (Sports Medicine) Marchia Meiers, MD (Inactive) as Consulting Physician (Ophthalmology) Vanita Ingles, RN as Case Manager (General Practice)  Recent office visits: 09/04/21: patient presented to Dr. [name] for follow-up.   Recent consult visits: 10/29/21: Patient presented to Lynann Bologna, PA (Cardiology) for follow-up.  10/25/21: Patient presented to Dr.  Werner Lean (Neurology) for Alzheimer consuklt. Memantinestarted, titrate up to 10 mg twice daily.  05/31/21: Patient presented to Dr. Ron Parker (Cardiology) for follow-up. No medication chagnes made.  04/09/21: Patient presented to Dr. Posey Pronto (Rheumatology) for follow-up.  Hospital visits: None in previous 6 months   Objective:  Lab Results  Component Value Date   CREATININE 1.07 (H) 09/03/2022   BUN 17 09/03/2022   GFRNONAA >60 07/12/2021   GFRAA 69 02/16/2019   NA 143 09/03/2022   K 5.1 09/03/2022   CALCIUM 9.2 09/03/2022   CO2 26 09/03/2022   GLUCOSE 92 09/03/2022    Lab Results  Component Value Date/Time   HGBA1C 6.0 (H) 09/03/2022 02:13 PM   HGBA1C 5.5 09/06/2021 10:02 AM    Last diabetic Eye exam: No results found for: "HMDIABEYEEXA"  Last diabetic Foot exam: No results found for: "HMDIABFOOTEX"   Lab Results  Component Value Date   CHOL 154 09/03/2022   HDL 77 09/03/2022   LDLCALC 64 09/03/2022   TRIG 64 09/03/2022   CHOLHDL 2.0 09/03/2022       Latest Ref Rng & Units 09/03/2022    2:13 PM 09/06/2021   10:02 AM 07/02/2021    2:47 PM  Hepatic Function  Total Protein 6.0 - 8.5 g/dL 6.4  6.5  6.7   Albumin 3.8 - 4.8 g/dL 4.4  4.3  3.8   AST 0 - 40 IU/L '19  20  22   ' ALT 0 - 32 IU/L '11  10  15   ' Alk Phosphatase 44 - 121 IU/L 72  98  64   Total Bilirubin 0.0 - 1.2 mg/dL 0.4  0.3  0.7     Lab Results  Component Value Date/Time   TSH 0.896 09/03/2022 02:13 PM  TSH 1.250 09/06/2021 10:02 AM   FREET4 0.93 01/13/2018 11:40 AM       Latest Ref Rng & Units 09/03/2022    2:13 PM 09/06/2021   10:02 AM 07/13/2021    5:50 AM  CBC  WBC 3.4 - 10.8 x10E3/uL 6.8  5.7  6.6   Hemoglobin 11.1 - 15.9 g/dL 13.9  13.7  10.2   Hematocrit 34.0 - 46.6 % 43.4  41.7  31.8   Platelets 150 - 450 x10E3/uL 226  270  173     No results found for: "VD25OH"  Clinical ASCVD: Yes  The ASCVD Risk score (Arnett DK, et al., 2019) failed to calculate for the following reasons:   The 2019 ASCVD  risk score is only valid for ages 65 to 44   The patient has a prior MI or stroke diagnosis       07/02/2022    1:33 PM 04/23/2022    3:01 PM 01/10/2021    2:42 PM  Depression screen PHQ 2/9  Decreased Interest 0 0 0  Down, Depressed, Hopeless 0 0 0  PHQ - 2 Score 0 0 0  Altered sleeping  0   Tired, decreased energy  0   Change in appetite  0   Feeling bad or failure about yourself   0   Trouble concentrating  0   Moving slowly or fidgety/restless  0   Suicidal thoughts  0   PHQ-9 Score  0   Difficult doing work/chores  Not difficult at all     Social History   Tobacco Use  Smoking Status Former   Packs/day: 0.50   Years: 35.00   Total pack years: 17.50   Types: Cigarettes   Quit date: 1991   Years since quitting: 32.7  Smokeless Tobacco Never  Tobacco Comments   Quit approx 1990   BP Readings from Last 3 Encounters:  09/03/22 (!) 143/52  04/23/22 (!) 101/50  09/04/21 124/74   Pulse Readings from Last 3 Encounters:  09/03/22 60  04/23/22 (!) 53  09/04/21 78   Wt Readings from Last 3 Encounters:  09/03/22 165 lb (74.8 kg)  07/02/22 175 lb (79.4 kg)  04/23/22 175 lb 4.8 oz (79.5 kg)   BMI Readings from Last 3 Encounters:  09/03/22 28.32 kg/m  07/02/22 30.04 kg/m  04/23/22 30.09 kg/m    Assessment/Interventions: Review of patient past medical history, allergies, medications, health status, including review of consultants reports, laboratory and other test data, was performed as part of comprehensive evaluation and provision of chronic care management services.   SDOH:  (Social Determinants of Health) assessments and interventions performed: Yes SDOH Interventions    Flowsheet Row Care Coordination from 08/29/2022 in [place] from 07/02/2022 in [place] Management from 02/04/2022 in [place] Management from 06/04/2021 in [place] Management from 02/05/2021 in [place] from 01/10/2021 in [place] Interventions -- Intervention Not Indicated -- -- -- --  Housing Interventions -- Intervention Not Indicated -- -- -- --  Transportation Interventions -- Intervention Not Indicated -- -- Intervention Not Indicated --  Utilities Interventions Intervention Not Indicated -- -- -- -- --  Financial Strain Interventions -- Intervention Not Indicated Intervention Not Indicated Intervention Not Indicated Intervention Not Indicated --  Physical Activity Interventions Other (Comments)  [patient unable to activity/exercise  due to back pain and discomfort] Patient Refused -- -- -- Patient Refused  Stress Interventions -- Intervention Not Indicated -- -- -- --  Social Connections Interventions -- Intervention Not Indicated -- -- -- --      SDOH Screenings   Food Insecurity: No Food Insecurity (07/02/2022)  Housing: Low Risk  (07/02/2022)  Transportation Needs: No Transportation Needs (07/02/2022)  Utilities: Not At Risk (08/29/2022)  Alcohol Screen: Low Risk  (07/02/2022)  Depression (PHQ2-9): Low Risk  (07/02/2022)  Financial Resource Strain: Low Risk  (07/02/2022)  Physical Activity: Inactive (08/29/2022)  Social Connections: Moderately Isolated (07/02/2022)  Stress: No Stress Concern Present (07/02/2022)  Tobacco Use: Medium Risk (09/03/2022)    CCM Care Plan  Allergies  Allergen Reactions   Sulfa Antibiotics Hives   Ivp Dye [Iodinated Contrast Media] Swelling   Amiodarone Nausea And Vomiting and Cough   Codeine Other (See Comments)    ALTERED MENTAL STATUS    Medications Reviewed Today     Reviewed by Julieta Bellini, CMA (Certified Medical Assistant) on 09/03/22 at 1332  Med List Status: <None>   Medication Order Taking? Sig Documenting Provider Last Dose Status Informant  acetaminophen (TYLENOL) 650 MG CR tablet  [id number] Yes Take 650 mg by mouth every 8 (eight) hours as needed for pain. [provider] Taking Active Self  apixaban (ELIQUIS) 5 MG TABS tablet [id number] Yes Take 1 tablet (5 mg total) by mouth 2 (two) times daily. Wellington Hampshire, MD Taking Active Self  budesonide-formoterol [facility]) 160-4.5 MCG/ACT inhaler [id number] Yes Inhale 2 puffs into the lungs 2 (two) times daily. Jerrol Banana., MD Taking Active            Med Note Kellie Simmering, Jenene Slicker   Fri Apr 12, 2021 10:26 AM)    cetirizine (ZYRTEC) 10 MG tablet [id number] Yes TAKE 1 TABLET BY MOUTH EVERY DAY Myles Gip, DO Taking Active   docusate sodium (COLACE) 100 MG capsule [id number] Yes Take 1 capsule (100 mg total) by mouth 2 (two) times daily. Duanne Guess, PA-C Taking Active   donepezil (ARICEPT) 5 MG tablet [id number] Yes TAKE 1 TABLET BY MOUTH EVERYDAY AT BEDTIME Jerrol Banana., MD Taking Active   fluticasone [facility]) 50 MCG/ACT nasal spray [id number] Yes PLACE 2 SPRAYS INTO BOTH NOSTRILS DAILY AS NEEDED FOR ALLERGIES. Jerrol Banana., MD Taking Active   furosemide (LASIX) 20 MG tablet [id number] Yes TAKE 1 TABLET (20 MG TOTAL) BY MOUTH DAILY AS NEEDED (SHORTNESS OF BREATH). Minna Merritts, MD Taking Active Self           Med Note Ivor Reining Jun 27, 2021  1:42 PM)    gabapentin (NEURONTIN) 300 MG capsule [id number] Yes TAKE TWO TABS BY MOUTH IN THE AM, TWO TABLETS BY MOUTH AT BEDTIME, AND 1 TABLET AT DINNER AS NEEDED.  Patient taking differently: 300 mg. 1 capsule in the morning, 2 capsules in the evening   Jerrol Banana., MD Taking Active   memantine [facility]) 10 MG tablet [id number] Yes Take 10 mg by mouth in the morning and at bedtime. [provider] Taking Active   methocarbamol (ROBAXIN) 500 MG tablet [id number] Yes Take 1 tablet (500 mg total) by mouth every 6 (six) hours as needed for muscle spasms. Duanne Guess, PA-C Taking Active   metoprolol succinate  (TOPROL-XL) 50 MG 24 hr tablet [id number] Yes Take by mouth. [provider] Taking [place]  COVID-19 BIVALENT 50 MCG/0.5ML injection [id number] Yes  [provider] Taking Active   Multiple Vitamin (MULTIVITAMIN WITH MINERALS) TABS tablet [id number] Yes Take 1 tablet by mouth daily. [provider] Taking Active   OTEZLA 30 MG TABS [id number] Yes Take 30 mg by mouth in the morning and at bedtime. [provider] Taking Active Self  rosuvastatin (CRESTOR) 5 MG tablet [id number] Yes TAKE 1 TABLET BY MOUTH EVERY DAY Jerrol Banana., MD Taking Active   sacubitril-valsartan [facility]) 24-26 MG [id number] Yes Take 1 tablet by mouth 2 (two) times daily. [provider] Taking Active   traMADol (ULTRAM) 50 MG tablet [id number] Yes Take 1 tablet (50 mg total) by mouth every 6 (six) hours as needed. Duanne Guess, PA-C Taking Active   venlafaxine [facility]) 75 MG tablet [id number] Yes TAKE 2 TABLETS (150 MG TOTAL) BY MOUTH DAILY. Jerrol Banana., MD Taking Active   vitamin B-12 (CYANOCOBALAMIN) 1000 MCG tablet [id number] Yes Take 1,000 mcg by mouth daily. [provider] Taking Active             Patient Active Problem List   Diagnosis Date Noted   Anticoagulated 07/01/2022   Paroxysmal atrial fibrillation ([place]) 07/01/2022   Status post catheter ablation of atrial fibrillation 07/01/2022   Status post total hip replacement, right 07/11/2021   Need for prophylactic vaccination against rabies 10/11/2020   Puncture wound 10/11/2020   Need for Tdap vaccination 10/11/2020   Dog bite of right lower leg with infection 10/11/2020   Neuropathy 05/02/2020   Chronic obstructive pulmonary disease ([place]) 03/11/2019   Atrial fibrillation ([place]) 11/11/2018   A-fib ([place]) 10/12/2018   Rheumatoid arthritis ([place]) 10/10/2018   Atrial fibrillation with RVR ([place]) 10/10/2018   Fusion of spine of cervical region 09/25/2018   Psoriatic arthritis ([place])  03/04/2018   Unstable angina (HCC)    Atrial fibrillation with rapid ventricular response ([place]) 01/13/2018   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   Asthma 05/09/2017   Primary osteoarthritis of both first carpometacarpal joints 01/31/2016   Arthralgia of both hands 01/31/2016   Ventricular premature depolarization 09/11/2015   Psoriasis 07/16/2015   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   Hyperlipidemia 03/29/2015   Pre-syncope 03/04/2015   Syncope and collapse 03/04/2015   CAD (coronary artery disease)    PVC's (premature ventricular contractions)    Ischemic cardiomyopathy    DDD (degenerative disc disease), lumbar 01/18/2015   Intervertebral disc disorder with radiculopathy of lumbar region 12/26/2014   L-S radiculopathy 07/03/2014   Breast CA ([place]) 06/28/2014   Arthritis, degenerative 06/28/2014   Unspecified osteoarthritis, unspecified site 06/28/2014   Atherosclerotic heart disease of native coronary artery without angina pectoris 06/28/2014   Acid reflux 03/08/2014   Malignant neoplasm of female breast ([place]) 03/08/2014   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    Immunization History  Administered Date(s) Administered   Fluad Quad(high Dose 65+) 09/12/2020, 09/04/2021, 09/03/2022   Influenza Whole 08/22/2018   Influenza, High Dose Seasonal PF 10/04/2015, 10/06/2016, 09/16/2017, 08/31/2018, 10/07/2019   Influenza-Unspecified 07/22/2014   Moderna Sars-Covid-2 Vaccination 02/02/2020, 03/01/2020, 11/09/2020   PFIZER Comirnaty([place])Covid-19 Tri-Sucrose Vaccine 05/31/2021   Pneumococcal Conjugate-13 10/06/2016   Pneumococcal Polysaccharide-23 11/13/2009   Tdap 10/11/2020    Conditions to be addressed/monitored:  Hypertension, Atrial Fibrillation, Heart Failure, Coronary Artery Disease, GERD, COPD, Asthma, Osteoarthritis, and Psoriatic Arthritis and Insomnia  There are no care plans that you recently modified to display for this patient.  Medication Assistance: None required.  Patient affirms current coverage meets needs.  Compliance/Adherence/Medication fill history: Care Gaps: Shingrix  Star-Rating Drugs: Rosuvastatin 5 mg last filled on 03/22/22 [facility] day supply [facility].  Patient's [facility]  [id number] [place] [address] [place]Phone: [phone]: [phone] [id number] [place] [address]one: [phone]: [id number] [address] [place]one: [phone]: [phone] pill box? Yes Pt endorses 100% compliance  We discussed: Current pharmacy is preferred with insurance plan and patient is satisfied with pharmacy services Patient decided to: Continue current medication management strategy  Care Plan and Follow Up Patient Decision:  Patient agrees to Care Plan and Follow-up.  Plan: Telephone follow up appointment with care management team member scheduled for:  08/05/2022 at 3:45 PM   AleJunius ArgyleharmD, [phone] Barriers:  No barriers noted  Pharmacist Clinical Goal(s):  Patient will maintain control of heart failure as evidenced by stable weight; no heart failure exacerbations  through collaboration with PharmD and provider.   Interventions: 1:1 collaboration with GilJerrol BananaMD regarding development and update of comprehensive plan of care as evidenced by provider attestation and co-signature Inter-disciplinary care team collaboration (see longitudinal plan of care) Comprehensive medication review performed; medication list updated in electronic medical record  Atrial Fibrillation (Goal: prevent stroke and major bleeding) -Controlled -Current treatment: Rate control: Metoprolol XL 50 mg  1/2 tablet twice daily  Anticoagulation: Eliquis 5 mg twice daily  -Medications previously tried: NA -Counseled on avoidance of NSAIDs due to increased bleeding risk with anticoagulants; -Recommended to continue current medication  Heart Failure (Goal: manage symptoms and prevent exacerbations) -Controlled -Last ejection fraction: 45%36%F type: Systolic -NYHA Class: II (slight limitation of activity) -AHA HF Stage: C (Heart disease and symptoms present) -Current treatment: Entresto 24-26 mg twice daily: Appropriate, Effective, Safe, Accessible Furosemide 20 mg daily as needed: Appropriate, Effective, Safe, Accessible  Metoprolol XL 50 mg 1/2 tablet twice daily: Appropriate, Effective, Safe, Accessible  -Medications previously tried: NA -Entresto was stopped after surgery due to hypotension, TerHelene Kelpports she was instructed to have the patient resume taking this medication. Denies further instances of hypotension in that time.  -Recommended to continue current medication  Hyperlipidemia: (LDL goal < 100) -Controlled -Current treatment: Rosuvastatin 5 mg daily  -Medications previously tried: NA  -Recommended to continue current medication  COPD/Asthma (Goal: control symptoms and prevent exacerbations) -Controlled -Current treatment  Symbicort 2 puffs twice daily  Montelukast 10 mg daily as needed  -Current allergy treatment  Cetirizine 10 mg daily as needed  Flonase 50 mcg/act 2 sprays daily as needed  -Medications previously tried: NA  In the past four weeks, has the patient had:  Daytime asthma symptoms more than twice weekly?   {yes/no:20286::"No"} Any instance of night waking due to asthma?   {yes/no:20286::"No"} Utilized albuterol reliever for symptoms more than twice weekly? {yes/no:20286::"No"} Any activity limitation due to asthma?    {yes/no:20286::"No"} -Recommended to continue current medication  Alzheimer's Disease (Goal: prevent worsening memory) -Not ideally  controlled -managed by Dr. O'BWerner Leanurrent treatment  Donepezil 5 mg nightly: Appropriate, Effective, Safe, Accessible Memantine 10 mg twice daily: Appropriate, Effective, Safe, Accessible Venlafaxine 75 mg 2 tablets daily  -Medications previously tried:  NA  -Recommended to continue current medication  *** (Goal: ***) -{US controlled/uncontrolled:25276} -Psoriatic arthritis, lumbar radiculopathy, neuropathy  -Current treatment  Acetaminophen 650 mg CR  Gabapentin 300 mg 2 caps AM, 1 tab PM, 2 tab HS  Hydrocodone-APAP Methocarbamol 500 mg every 6 hours as needed  Tramadol 50 mg every 6 hours as needed  Otezla 30 mg twice daily  -Medications previously tried: ***  -{CCMPHARMDINTERVENTION:25122}   Goals/Self-Care Activities Patient will:  - check blood pressure 2-3 times weekly, document, and provide at future appointments weigh daily, and contact provider if weight gain of greater than 2 pounds  Follow Up Plan: ***

## 2022-09-25 ENCOUNTER — Telehealth: Payer: Self-pay

## 2022-09-25 NOTE — Progress Notes (Signed)
    Chronic Care Management Pharmacy Assistant   Name: Teresa [name]  MRN: [id number] DOB: 1941-08-01  Reason for Encounter: Medication Review/General Adherence call.   Recent office visits:  09/03/2022 Dr. [name] MD (PCP) No Medication Changes noted 07/02/2022 Kirke Shaggy LPN (PCP Office) Medicare wellness completed, no medication Changes noted  Recent consult visits:  08/28/2022 Gwenlyn Fudge PA (Ortho Surgery) Start Tramadol 50 mg PRN  Hospital visits:  None in previous 6 months  Medications: Outpatient Encounter Medications as of 09/25/2022  Medication Sig   acetaminophen (TYLENOL) 650 MG CR tablet Take 650 mg by mouth every 8 (eight) hours as needed for pain.   apixaban (ELIQUIS) 5 MG TABS tablet Take 1 tablet (5 mg total) by mouth 2 (two) times daily.   budesonide-formoterol (SYMBICORT) 160-4.5 MCG/ACT inhaler Inhale 2 puffs into the lungs 2 (two) times daily.   cetirizine (ZYRTEC) 10 MG tablet TAKE 1 TABLET BY MOUTH EVERY DAY   docusate sodium (COLACE) 100 MG capsule Take 1 capsule (100 mg total) by mouth 2 (two) times daily.   donepezil (ARICEPT) 5 MG tablet TAKE 1 TABLET BY MOUTH EVERYDAY AT BEDTIME   fluticasone (FLONASE) 50 MCG/ACT nasal spray PLACE 2 SPRAYS INTO BOTH NOSTRILS DAILY AS NEEDED FOR ALLERGIES.   furosemide (LASIX) 20 MG tablet TAKE 1 TABLET (20 MG TOTAL) BY MOUTH DAILY AS NEEDED (SHORTNESS OF BREATH).   gabapentin (NEURONTIN) 300 MG capsule TAKE TWO TABS BY MOUTH IN THE AM, TWO TABLETS BY MOUTH AT BEDTIME, AND 1 TABLET AT DINNER AS NEEDED. (Patient taking differently: 300 mg. 1 capsule in the morning, 2 capsules in the evening)   memantine (NAMENDA) 10 MG tablet Take 10 mg by mouth in the morning and at bedtime.   methocarbamol (ROBAXIN) 500 MG tablet Take 1 tablet (500 mg total) by mouth every 6 (six) hours as needed for muscle spasms.   metoprolol succinate (TOPROL-XL) 50 MG 24 hr tablet Take by mouth.   MODERNA COVID-19 BIVALENT 50 MCG/0.5ML injection     Multiple Vitamin (MULTIVITAMIN WITH MINERALS) TABS tablet Take 1 tablet by mouth daily.   OTEZLA 30 MG TABS Take 30 mg by mouth in the morning and at bedtime.   rosuvastatin (CRESTOR) 5 MG tablet TAKE 1 TABLET BY MOUTH EVERY DAY   sacubitril-valsartan (ENTRESTO) 24-26 MG Take 1 tablet by mouth 2 (two) times daily.   traMADol (ULTRAM) 50 MG tablet Take 1 tablet (50 mg total) by mouth every 6 (six) hours as needed.   venlafaxine (EFFEXOR) 75 MG tablet TAKE 2 TABLETS (150 MG TOTAL) BY MOUTH DAILY.   vitamin B-12 (CYANOCOBALAMIN) 1000 MCG tablet Take 1,000 mcg by mouth daily.   No facility-administered encounter medications on file as of 09/25/2022.    Care Gaps: Shingrix Vaccine COVID-19 Vaccine HTN: 143/52 - 09/03/2022  Star Rating Drugs: Rosuvastatin 5 mg last filled [facility]. Entresto 24-26 mg last filled 07/25/2022 [facility].   Medication Fill Gaps: None ID   I have attempted without success to contact this patient by phone three times to do general adherence  call. I left a Voice message for patient to return my call.   [place] Pharmacist Assistant [phone]

## 2022-10-12 ENCOUNTER — Other Ambulatory Visit: Payer: Self-pay | Admitting: Family Medicine

## 2022-10-13 ENCOUNTER — Other Ambulatory Visit: Payer: Self-pay | Admitting: Family Medicine

## 2022-10-13 MED ORDER — GABAPENTIN 300 MG PO CAPS: ORAL_CAPSULE | ORAL | 0 refills | Status: DC

## 2022-10-14 DIAGNOSIS — Z961 Presence of intraocular lens: Secondary | ICD-10-CM | POA: Diagnosis not present

## 2022-10-28 ENCOUNTER — Ambulatory Visit: Payer: Self-pay

## 2022-10-28 ENCOUNTER — Ambulatory Visit: Payer: Medicare HMO | Admitting: Physician Assistant

## 2022-10-28 NOTE — Telephone Encounter (Signed)
pt called, left VM to return the call to the office to discuss symptoms with a nurse.  Summary: cough / rx req   The patient shares that they have had a cough since yesterday afternoon  The patient shares that they are coughing up mucus and would like to be prescribed something for their discomfort  Please contact the patient further when possible

## 2022-10-28 NOTE — Telephone Encounter (Signed)
  Chief Complaint: productive cough, green sputum Symptoms: coughing  worse than yesterday. Chills. Has not checked temp. Coughing up green mucus.  Frequency: yesterday  Pertinent Negatives: Patient denies chest pain no difficulty breathing. No runny nose no other sx reported  Disposition: '[]'$ ED /'[]'$ Urgent Care (no appt availability in office) / '[x]'$ Appointment(In office/virtual)/ '[]'$  [place] Virtual [facility]/ '[]'$ Home Care/ '[]'$ Refused Recommended Disposition /'[]'$  Mobile Bus/ '[]'$  Follow-up with PCP Additional Notes:   Appt today .    Reason for Disposition  [1] Continuous (nonstop) coughing interferes with work or school AND [2] no improvement using cough treatment per Care Advice  Answer Assessment - Initial Assessment Questions 1. ONSET: "When did the cough begin?"      Yesterday afternoon  2. SEVERITY: "How bad is the cough today?"      Worse today than yesterday  3. SPUTUM: "Describe the color of your sputum" (none, dry cough; clear, white, yellow, green)     Green  4. HEMOPTYSIS: "Are you coughing up any blood?" If so ask: "How much?" (flecks, streaks, tablespoons, etc.)     Na  5. DIFFICULTY BREATHING: "Are you having difficulty breathing?" If Yes, ask: "How bad is it?" (e.g., mild, moderate, severe)    - MILD: No SOB at rest, mild SOB with walking, speaks normally in sentences, can lie down, no retractions, pulse < 100.    - MODERATE: SOB at rest, SOB with minimal exertion and prefers to sit, cannot lie down flat, speaks in phrases, mild retractions, audible wheezing, pulse 100-120.    - SEVERE: Very SOB at rest, speaks in single words, struggling to breathe, sitting hunched forward, retractions, pulse > 120      na 6. FEVER: "Do you have a fever?" If Yes, ask: "What is your temperature, how was it measured, and when did it start?"     na 7. CARDIAC HISTORY: "Do you have any history of heart disease?" (e.g., heart attack, congestive heart failure)      Na  8. LUNG HISTORY: "Do  you have any history of lung disease?"  (e.g., pulmonary embolus, asthma, emphysema)     na 9. PE RISK FACTORS: "Do you have a history of blood clots?" (or: recent major surgery, recent prolonged travel, bedridden)     Na  10. OTHER SYMPTOMS: "Do you have any other symptoms?" (e.g., runny nose, wheezing, chest pain)       Chills cough up green sputum  11. PREGNANCY: "Is there any chance you are pregnant?" "When was your last menstrual period?"       na 12. TRAVEL: "Have you traveled out of the country in the last month?" (e.g., travel history, exposures)       na  Protocols used: Cough - Acute Productive-A-AH

## 2022-10-29 ENCOUNTER — Encounter: Payer: Self-pay | Admitting: Physician Assistant

## 2022-10-29 ENCOUNTER — Ambulatory Visit: Payer: Self-pay | Admitting: *Deleted

## 2022-10-29 ENCOUNTER — Telehealth (INDEPENDENT_AMBULATORY_CARE_PROVIDER_SITE_OTHER): Payer: Medicare HMO | Admitting: Physician Assistant

## 2022-10-29 DIAGNOSIS — U071 COVID-19: Secondary | ICD-10-CM

## 2022-10-29 DIAGNOSIS — E755 Other lipid storage disorders: Secondary | ICD-10-CM | POA: Diagnosis not present

## 2022-10-29 MED ORDER — MOLNUPIRAVIR EUA 200MG CAPSULE: 4.0000 | ORAL_CAPSULE | Freq: Two times a day (BID) | ORAL | 0 refills | Status: AC

## 2022-10-29 NOTE — Telephone Encounter (Signed)
  Chief Complaint: + COVID Symptoms: cough, fatigue, wheezing Frequency: symptoms started Monday Pertinent Negatives: Patient denies SOB Disposition: '[]'$ ED /'[]'$ Urgent Care (no appt availability in office) / '[x]'$ Appointment(In office/virtual)/ '[]'$  [place] [facility]/ '[]'$ Home Care/ '[]'$ Refused Recommended Disposition /'[]'$ [facility]/ '[]'$  Follow-up with PCP Additional Notes: COVID protocol reviewed: treatment/isolation, virtual appointment scheduled   Reason for Disposition  [1] HIGH RISK patient (e.g., weak immune system, age > 41 years, obesity with BMI 30 or higher, pregnant, chronic lung disease or other chronic medical condition) AND [2] COVID symptoms (e.g., cough, fever)  (Exceptions: Already seen by PCP and no new or worsening symptoms.)  Protocols used: Coronavirus (COVID-19) Diagnosed or Suspected-A-AH

## 2022-10-29 NOTE — Progress Notes (Signed)
MyChart Telephone Visit    Virtual Visit via Telephone Note   This visit type was conducted due to national recommendations for restrictions regarding the COVID-19 Pandemic (e.g. social distancing) in an effort to limit this patient's exposure and mitigate transmission in our community. This patient is at least at moderate risk for complications without adequate follow up. This format is felt to be most appropriate for this patient at this time. Physical exam was limited by quality of the audio technology used for the visit.   Initially visit was scheduled as a video visit but I was unable to hear pt or daughter on video. We transitioned to a phone visit.  Patient location: home Provider location: [facility]  I discussed the limitations of evaluation and management by telemedicine and the availability of in person appointments. The patient expressed understanding and agreed to proceed.  Patient: Teresa [name]   DOB: Jan 12, 1941   81 y.o. Female  MRN: [id number] Visit Date: 10/29/2022  Today's healthcare provider: Mikey Kirschner, PA-C   Chief Complaint  Patient presents with   Cough   Subjective    Cough This is a new problem. Episode onset: 2 days ago. Associated symptoms include shortness of breath and wheezing. Pertinent negatives include no chest pain, chills or fever. Treatments tried: OTC cold flu medication. The treatment provided no relief.    Home COVID test was positive 2 days ago. Medications: Outpatient Medications Prior to Visit  Medication Sig   acetaminophen (TYLENOL) 650 MG CR tablet Take 650 mg by mouth every 8 (eight) hours as needed for pain.   apixaban (ELIQUIS) 5 MG TABS tablet Take 1 tablet (5 mg total) by mouth 2 (two) times daily.   budesonide-formoterol (SYMBICORT) 160-4.5 MCG/ACT inhaler Inhale 2 puffs into the lungs 2 (two) times daily.   cetirizine (ZYRTEC) 10 MG tablet TAKE 1 TABLET BY MOUTH EVERY DAY   docusate sodium (COLACE) 100 MG capsule Take 1  capsule (100 mg total) by mouth 2 (two) times daily.   donepezil (ARICEPT) 5 MG tablet TAKE 1 TABLET BY MOUTH EVERYDAY AT BEDTIME   fluticasone (FLONASE) 50 MCG/ACT nasal spray PLACE 2 SPRAYS INTO BOTH NOSTRILS DAILY AS NEEDED FOR ALLERGIES.   furosemide (LASIX) 20 MG tablet TAKE 1 TABLET (20 MG TOTAL) BY MOUTH DAILY AS NEEDED (SHORTNESS OF BREATH).   gabapentin (NEURONTIN) 300 MG capsule 1 capsule in the morning, 2 capsules in the evening   memantine (NAMENDA) 10 MG tablet Take 10 mg by mouth in the morning and at bedtime.   methocarbamol (ROBAXIN) 500 MG tablet Take 1 tablet (500 mg total) by mouth every 6 (six) hours as needed for muscle spasms.   metoprolol succinate (TOPROL-XL) 50 MG 24 hr tablet Take by mouth.   Multiple Vitamin (MULTIVITAMIN WITH MINERALS) TABS tablet Take 1 tablet by mouth daily.   OTEZLA 30 MG TABS Take 30 mg by mouth in the morning and at bedtime.   rosuvastatin (CRESTOR) 5 MG tablet TAKE 1 TABLET BY MOUTH EVERY DAY   sacubitril-valsartan (ENTRESTO) 24-26 MG Take 1 tablet by mouth 2 (two) times daily.   traMADol (ULTRAM) 50 MG tablet Take 1 tablet (50 mg total) by mouth every 6 (six) hours as needed.   venlafaxine (EFFEXOR) 75 MG tablet TAKE 2 TABLETS (150 MG TOTAL) BY MOUTH DAILY.   vitamin B-12 (CYANOCOBALAMIN) 1000 MCG tablet Take 1,000 mcg by mouth daily.   [DISCONTINUED] MODERNA COVID-19 BIVALENT 50 MCG/0.5ML injection  (Patient not taking: Reported on 10/29/2022)  No facility-administered medications prior to visit.    Review of Systems  Constitutional:  Positive for fatigue. Negative for appetite change, chills and fever.  Respiratory:  Positive for cough, shortness of breath and wheezing. Negative for chest tightness.   Cardiovascular:  Negative for chest pain and palpitations.  Gastrointestinal:  Negative for abdominal pain, nausea and vomiting.  Neurological:  Negative for dizziness and weakness.     Objective    There were no vitals taken for this  visit.   Physical Exam    Assessment & Plan     COVID 19 URI Candidate for antiviral, d/t eliquis sent in [place] corcidin products during the day, nyquil is okay at night For SOB-- use inhaler symbicort BID  If any worsening of symptoms, increased SOB, wheezing, chest pain, fevers, call office; can send in prednisone if not improved. Difficult to fully evaluate SOB through telephone visit  Return if symptoms worsen or fail to improve.    I discussed the assessment and treatment plan with the patient. The patient was provided an opportunity to ask questions and all were answered. The patient agreed with the plan and demonstrated an understanding of the instructions.   The patient was advised to call back or seek an in-person evaluation if the symptoms worsen or if the condition fails to improve as anticipated.  I provided 7 minutes of non-face-to-face time during this encounter.  I, Mikey Kirschner, PA-C have reviewed all documentation for this visit. The documentation on  10/29/2022  for the exam, diagnosis, procedures, and orders are all accurate and complete.   [place]

## 2022-10-29 NOTE — Telephone Encounter (Signed)
Summary: positive covoid/cough and tired   Patient's daughter, called in states patient tested positive for covid with home test last night, She has is coughing and tired. She is asking for plaxovid to be sent to pharmacy.  [id number] [place] [facility] [address] Phone: [phone]: [phone]    Answer Assessment - Initial Assessment Questions 1. COVID-19 DIAGNOSIS: "How do you know that you have COVID?" (e.g., positive lab test or self-test, diagnosed by doctor or NP/PA, symptoms after exposure).     Home test last night 2. COVID-19 EXPOSURE: "Was there any known exposure to COVID before the symptoms began?" CDC Definition of close contact: within 6 feet (2 meters) for a total of 15 minutes or more over a 24-hour period.      No known exposure 3. ONSET: "When did the COVID-19 symptoms start?"      Monday 4. WORST SYMPTOM: "What is your worst symptom?" (e.g., cough, fever, shortness of breath, muscle aches)     Cough,wheezing 5. COUGH: "Do you have a cough?" If Yes, ask: "How bad is the cough?"       Yes- productive- light green 6. FEVER: "Do you have a fever?" If Yes, ask: "What is your temperature, how was it measured, and when did it start?"     no 7. RESPIRATORY STATUS: "Describe your breathing?" (e.g., normal; shortness of breath, wheezing, unable to speak)      wheezing 8. BETTER-SAME-WORSE: "Are you getting better, staying the same or getting worse compared to yesterday?"  If getting worse, ask, "In what way?"     same 9. OTHER SYMPTOMS: "Do you have any other symptoms?"  (e.g., chills, fatigue, headache, loss of smell or taste, muscle pain, sore throat)     fatigue 10. HIGH RISK DISEASE: "Do you have any chronic medical problems?" (e.g., asthma, heart or lung disease, weak immune system, obesity, etc.)       Age, autoimmune disease, heart disease  11. VACCINE: "Have you had the COVID-19 vaccine?" If Yes, ask: "Which one, how many shots, when did you get it?"        Not recent  Protocols used: Coronavirus (COVID-19) Diagnosed or Suspected-A-AH

## 2022-10-31 ENCOUNTER — Ambulatory Visit: Payer: Self-pay | Admitting: *Deleted

## 2022-10-31 NOTE — Patient Instructions (Signed)
Visit Information  Thank you for taking time to visit with me today. Please don't hesitate to contact me if I can be of assistance to you before our next scheduled telephone appointment.  Following are the goals we discussed today:  Continue to use Covid Precautions.   Seek emergency medical attention if you feel worse or not getting better.   Our next appointment is by telephone on 11/28  Please call the care guide team at [phone] if you need to cancel or reschedule your appointment.   Please call the Suicide and Crisis Lifeline: 988 call the [place] National Suicide Prevention Lifeline: 936-186-5339 or TTY: 778-785-7338 TTY 4452601747) to talk to a trained counselor call 1-800-273-TALK (toll free, 24 hour hotline) call 911 if you are experiencing a Mental Health or [place] or need someone to talk to.  Patient verbalizes understanding of instructions and care plan provided today and agrees to view in [place]. Active MyChart status and patient understanding of how to access instructions and care plan via MyChart confirmed with patient.     The patient has been provided with contact information for the care management team and has been advised to call with any health related questions or concerns.   Valente David, RN, MSN, [place] Care Management Care Management Coordinator [phone]

## 2022-10-31 NOTE — Patient Outreach (Signed)
  Care Coordination   Follow Up Visit Note   10/31/2022 Name: STELLAROSE CERNY MRN: [id number] DOB: 05-Mar-1941  Teresa [name] is a 81 y.o. year old female who sees Jerrol Banana., MD for primary care. I spoke with  Otho Bellows by phone today.  What matters to the patients health and wellness today?  Tested positive for Covid on Tuesday, virtual visit with PCP on Wednesday.  Starting to feel better, still has cough.  Denies any urgent concerns, encouraged to contact this care manager with questions.      Goals Addressed             This Visit's Progress    RNCM: Effective Management of pain   On track    Care Coordination Interventions: Rates her back pain at an 4 today on a scale of 0-10 Reviewed provider established plan for pain management. The patient saw specialist, MRI completed, no issues identified.  Discussed importance of adherence to all scheduled medical appointments. Counseled on the importance of reporting any/all new or changed pain symptoms or management strategies to pain management provider Advised patient to report to care team affect of pain on daily activities. Unable to do a lot of activity due to back pain, review of being safe and preventing falls. Will continue to monitor Discussed use of relaxation techniques and/or diversional activities to assist with pain reduction (distraction, imagery, relaxation, massage, acupressure, TENS, heat, and cold application Reviewed with patient prescribed pharmacological and nonpharmacological pain relief strategies Advised patient to discuss unresolved pain, changes in level or intensity of pain with provider The patient agrees to regular outreaches from the [facility]. The patient has the [facility] contact information.  Review of the goals of the care coordination program.  Active listening / Reflection utilized  Emotional Support Provided   Care Coordination Interventions: Provided education to patient to enhance basic  understanding of COVID-19 as a viral disease, measures to prevent exposure, signs and symptoms, recommended vaccine schedule, when to contact provider Discussed effects, symptoms, and management of "long COVID"' Discussed medication, currently taking Paxlovid, state she is feeling a little better         SDOH assessments and interventions completed:  No     Care Coordination Interventions Activated:  Yes  Care Coordination Interventions:  Yes, provided   Follow up plan: Follow up call scheduled for 11/28    Encounter Outcome:  Pt. Visit Completed   Valente David, RN, MSN, [place] Care Management Care Management Coordinator [phone]

## 2022-11-18 ENCOUNTER — Ambulatory Visit: Payer: Self-pay | Admitting: *Deleted

## 2022-11-18 NOTE — Patient Outreach (Signed)
  Care Coordination   Follow Up Visit Note   11/18/2022 Name: Teresa [name] MRN: [id number] DOB: Sep 22, 1941  Teresa [name] is a 81 y.o. year old female who sees Jerrol Banana., MD for primary care. I spoke with  Otho Bellows by phone today.  What matters to the patients health and wellness today?  Ongoing overall health maintenance.  Denies any urgent concerns, encouraged to contact this care manager with questions.      Goals Addressed             This Visit's Progress    COMPLETED: RNCM: Effective Management of pain   On track    Care Coordination Interventions: Rates her back pain at an 4 today on a scale of 0-10 Reviewed provider established plan for pain management. The patient saw specialist, MRI completed, no issues identified.  Discussed importance of adherence to all scheduled medical appointments. Counseled on the importance of reporting any/all new or changed pain symptoms or management strategies to pain management provider Advised patient to report to care team affect of pain on daily activities. Unable to do a lot of activity due to back pain, review of being safe and preventing falls. Will continue to monitor Discussed use of relaxation techniques and/or diversional activities to assist with pain reduction (distraction, imagery, relaxation, massage, acupressure, TENS, heat, and cold application Reviewed with patient prescribed pharmacological and nonpharmacological pain relief strategies Advised patient to discuss unresolved pain, changes in level or intensity of pain with provider The patient agrees to regular outreaches from the [facility]. The patient has the [facility] contact information.  Review of the goals of the care coordination program.  Active listening / Reflection utilized  Emotional Support Provided   Care Coordination Interventions: Provided education to patient to enhance basic understanding of COVID-19 as a viral disease, measures to prevent  exposure, signs and symptoms, recommended vaccine schedule, when to contact provider Discussed effects, symptoms, and management of "long COVID"' Discussed medication, currently taking Paxlovid, state she is feeling a little better   Update 11/28 - State pain is good today, taking over the counter pain relief.  Has recovered from Covid and report feeling much better.         SDOH assessments and interventions completed:  No     Care Coordination Interventions:  Yes, provided   Follow up plan: No further intervention required.   Encounter Outcome:  Pt. Visit Completed   Valente David, RN, MSN [facility] Care Management Care Management Coordinator [phone]

## 2022-11-18 NOTE — Patient Instructions (Signed)
Visit Information  Thank you for taking time to visit with me today. Please don't hesitate to contact me if I can be of assistance to you.  Following are the goals we discussed today:  Contact this RNCM if you have questions.  Please call the Suicide and Crisis Lifeline: 988 call the [place] National Suicide Prevention Lifeline: 802 329 0029 or TTY: 276-007-1670 TTY 518-275-3745) to talk to a trained counselor call 1-800-273-TALK (toll free, 24 hour hotline) call 911 if you are experiencing a Mental Health or [place] or need someone to talk to.  Patient verbalizes understanding of instructions and care plan provided today and agrees to view in [place]. Active MyChart status and patient understanding of how to access instructions and care plan via MyChart confirmed with patient.     The patient has been provided with contact information for the care management team and has been advised to call with any health related questions or concerns.   Valente David, RN, MSN, [place] Care Management Care Management Coordinator [phone]

## 2022-12-13 ENCOUNTER — Other Ambulatory Visit: Payer: Self-pay | Admitting: Family Medicine

## 2023-01-05 ENCOUNTER — Encounter: Payer: Medicare HMO | Admitting: Family Medicine

## 2023-01-10 ENCOUNTER — Other Ambulatory Visit: Payer: Self-pay | Admitting: Family Medicine

## 2023-01-17 ENCOUNTER — Other Ambulatory Visit: Payer: Self-pay | Admitting: Family Medicine

## 2023-01-19 ENCOUNTER — Other Ambulatory Visit: Payer: Self-pay | Admitting: Family Medicine

## 2023-01-21 ENCOUNTER — Other Ambulatory Visit: Payer: Self-pay | Admitting: Family Medicine

## 2023-01-23 ENCOUNTER — Other Ambulatory Visit: Payer: Self-pay | Admitting: Family Medicine

## 2023-01-23 NOTE — Telephone Encounter (Addendum)
[phone] and spoke to [facility] about the refill(s) Gabapentin requested. Advised it was sent on 01/11/23 #90/0 refill(s). She states it was received and filled on 01/12/23, pt picked up on 01/14/23. Rx was only for 30 DS with no refills. Pt should have enough medication to last until 02/11/23. Pt will need OV with Dr. Quentin Cornwall for prior refills since Dr. [name] no longer with practice. Pt called, unable to LVM at this time.

## 2023-01-23 NOTE — Telephone Encounter (Signed)
Medication Refill - Medication: gabapentin (NEURONTIN) 300 MG capsule   Has the patient contacted their pharmacy? Yes.    (Agent: If yes, when and what did the pharmacy advise?) Contact provider   Preferred Pharmacy (with phone number or street name):  [id number] [place] [id number] Phone: [phone]: [phone]  Has the patient been seen for an appointment in the last year OR does the patient have an upcoming appointment? Yes.    Agent: Please be advised that RX refills may take up to 3 business days. We ask that you follow-up with your pharmacy.

## 2023-01-24 ENCOUNTER — Other Ambulatory Visit: Payer: Self-pay | Admitting: Family Medicine

## 2023-02-10 ENCOUNTER — Other Ambulatory Visit: Payer: Self-pay | Admitting: Family Medicine

## 2023-02-20 DIAGNOSIS — R051 Acute cough: Secondary | ICD-10-CM | POA: Diagnosis not present

## 2023-02-20 DIAGNOSIS — E7800 Pure hypercholesterolemia, unspecified: Secondary | ICD-10-CM | POA: Diagnosis not present

## 2023-02-20 DIAGNOSIS — J449 Chronic obstructive pulmonary disease, unspecified: Secondary | ICD-10-CM | POA: Diagnosis not present

## 2023-02-20 DIAGNOSIS — J188 Other pneumonia, unspecified organism: Secondary | ICD-10-CM | POA: Diagnosis not present

## 2023-02-20 DIAGNOSIS — R059 Cough, unspecified: Secondary | ICD-10-CM | POA: Diagnosis not present

## 2023-02-20 DIAGNOSIS — J189 Pneumonia, unspecified organism: Secondary | ICD-10-CM | POA: Diagnosis not present

## 2023-02-20 DIAGNOSIS — Z951 Presence of aortocoronary bypass graft: Secondary | ICD-10-CM | POA: Diagnosis not present

## 2023-02-20 DIAGNOSIS — I255 Ischemic cardiomyopathy: Secondary | ICD-10-CM | POA: Diagnosis not present

## 2023-02-20 DIAGNOSIS — Z9889 Other specified postprocedural states: Secondary | ICD-10-CM | POA: Diagnosis not present

## 2023-03-24 ENCOUNTER — Other Ambulatory Visit: Payer: Self-pay | Admitting: Family Medicine

## 2023-03-24 DIAGNOSIS — Z1231 Encounter for screening mammogram for malignant neoplasm of breast: Secondary | ICD-10-CM

## 2023-04-21 ENCOUNTER — Ambulatory Visit
Admission: RE | Admit: 2023-04-21 | Discharge: 2023-04-21 | Disposition: A | Discharge status: Home / Self Care | Payer: Medicare HMO | Source: Ambulatory Visit | Attending: Family Medicine | Admitting: Family Medicine

## 2023-04-21 DIAGNOSIS — Z1231 Encounter for screening mammogram for malignant neoplasm of breast: Secondary | ICD-10-CM | POA: Diagnosis not present

## 2023-05-18 ENCOUNTER — Other Ambulatory Visit: Payer: Self-pay | Admitting: Family Medicine

## 2023-05-20 NOTE — Telephone Encounter (Signed)
Unable to refill per protocol, PCP no longer at this practice, patient's PCP is Dr. Sullivan Lone.  Requested Prescriptions  Pending Prescriptions Disp Refills   donepezil (ARICEPT) 5 MG tablet [Pharmacy Med Name: DONEPEZIL HCL 5 MG TABLET] 90 tablet 1    Sig: TAKE 1 TABLET BY MOUTH EVERYDAY AT BEDTIME     Neurology:  Alzheimer's Agents Failed - 05/18/2023  6:22 PM      Failed - Valid encounter within last 6 months    Recent Outpatient Visits           6 months ago COVID-19   [facility] Edwards, [place], PA-C   8 months ago MCI (mild cognitive impairment)   [facility] Bosie Clos, MD   1 year ago Neuropathy   [facility] Bosie Clos, MD   1 year ago Hyperglycemia   [name]ie Clos, [facility]   2 years ago Hyperglycemia    [facility] Bosie Clos, MD               gabapentin (NEURONTIN) 300 MG capsule [Pharmacy Med Name: GABAPENTIN 300 MG CAPSULE] 270 capsule 0    Sig: TAKE ONE BY MOUTH CAPSULE IN THE MORNING, 2 CAPSULES IN THE EVENING     Neurology: Anticonvulsants - gabapentin Failed - 05/18/2023  6:22 PM      Failed - Cr in normal range and within 360 days    Creat  Date Value Ref Range Status  10/21/2017 0.70 0.60 - 0.93 mg/dL Final    Comment:    For patients >1 years of age, the reference limit for Creatinine is approximately 13% higher for people identified as African-American. .    Creatinine, Ser  Date Value Ref Range Status  09/03/2022 1.07 (H) 0.57 - 1.00 mg/dL Final         Passed - Completed PHQ-2 or PHQ-9 in the last 360 days      Passed - Valid encounter within last 12 months    Recent Outpatient Visits           6 months ago COVID-19   [facility] Ok Edwards, [place], PA-C   8 months ago MCI (mild cognitive impairment)   [facility]  Bosie Clos, MD   1 year ago Neuropathy   [facility] Bosie Clos, MD   1 year ago Hyperglycemia   [facility] Bosie Clos, MD   2 years ago Hyperglycemia   [facility] Bosie Clos, MD

## 2023-05-22 ENCOUNTER — Other Ambulatory Visit: Payer: Self-pay | Admitting: Family Medicine

## 2023-05-22 NOTE — Telephone Encounter (Signed)
Medication Refill - Medication: donepezil (ARICEPT) 5 MG tablet /donepezil (ARICEPT) 5 MG tablet Meds denied , says unable to fill protocol, Dr Sherrie Mustache isnt here, but Dr Sharol Harness can fill correct?  Has the patient contacted their pharmacy? yes (Agent: If no, request that the patient contact the pharmacy for the refill. If patient does not wish to contact the pharmacy document the reason why and proceed with request.) (Agent: If yes, when and what did the pharmacy advise?)contact pcp  Preferred Pharmacy (with phone number or street name):  Has the patient been seen for an appointment in the last year OR does the patient have an upcoming appointment? yes  Agent: Please be advised that RX refills may take up to 3 business days. We ask that you follow-up with your pharmacy.

## 2023-05-22 NOTE — Telephone Encounter (Signed)
Requested medications are due for refill today.  Yes   Requested medications are on the active medications list.  yes  Last refill. 10/13/2022 #90 1 rf  Future visit scheduled.   Yes - but also going to Dr. Sullivan Lone  Notes to clinic.  Please review for refill. Dr. Sullivan Lone listed as PCP.    Requested Prescriptions  Pending Prescriptions Disp Refills   donepezil (ARICEPT) 5 MG tablet 90 tablet 1     Neurology:  Alzheimer's Agents Failed - 05/22/2023  4:59 PM      Failed - Valid encounter within last 6 months    Recent Outpatient Visits           6 months ago COVID-19   [facility] Edwards, [place], PA-C   8 months ago MCI (mild cognitive impairment)   [facility] Bosie Clos, MD   1 year ago Neuropathy   [facility] Bosie Clos, MD   1 year ago Hyperglycemia   [facility] Bosie Clos, MD   2 years ago Hyperglycemia   [facility] Bosie Clos, MD       Future Appointments             In 3 weeks Simmons-Robinson, Tawanna Cooler, MD [facility]

## 2023-05-22 NOTE — Telephone Encounter (Signed)
Pt's daughter called in checking status of refill on Gabapetin she is all out of. I let her know we are working on it and of 48-72 hr turnaround

## 2023-05-22 NOTE — Telephone Encounter (Signed)
Unable to refill per protocol, last refill by another provider no longer at this practice.  Requested Prescriptions  Pending Prescriptions Disp Refills   gabapentin (NEURONTIN) 300 MG capsule [Pharmacy Med Name: GABAPENTIN 300 MG CAPSULE] 270 capsule 0    Sig: TAKE ONE BY MOUTH CAPSULE IN THE MORNING, 2 CAPSULES IN THE EVENING     Neurology: Anticonvulsants - gabapentin Failed - 05/22/2023  2:58 PM      Failed - Cr in normal range and within 360 days    Creat  Date Value Ref Range Status  10/21/2017 0.70 0.60 - 0.93 mg/dL Final    Comment:    For patients >24 years of age, the reference limit for Creatinine is approximately 13% higher for people identified as African-American. .    Creatinine, Ser  Date Value Ref Range Status  09/03/2022 1.07 (H) 0.57 - 1.00 mg/dL Final         Passed - Completed PHQ-2 or PHQ-9 in the last 360 days      Passed - Valid encounter within last 12 months    Recent Outpatient Visits           6 months ago COVID-19   [facility] Ok Edwards, [place], PA-C   8 months ago MCI (mild cognitive impairment)   [facility] Bosie Clos, MD   1 year ago Neuropathy   [facility] Bosie Clos, MD   1 year ago Hyperglycemia   [facility] Bosie Clos[facility]   2 years ago Hyperglycemia   [facility] Bosie Clos, MD               donepezil (ARICEPT) 5 MG tablet [Pharmacy Med Name: DONEPEZIL HCL 5 MG TABLET] 90 tablet 1    Sig: TAKE 1 TABLET BY MOUTH EVERYDAY AT BEDTIME     Neurology:  Alzheimer's Agents Failed - 05/22/2023  2:58 PM      Failed - Valid encounter within last 6 months    Recent Outpatient Visits           6 months ago COVID-19   [facility] [place], PA-C   8 months ago MCI (mild cognitive impairment)   [facility] Bosie Clos, MD   1 year ago Neuropathy   [facility] Bosie Clos, MD   1 year ago Hyperglycemia   [facility] Bosie Clos, MD   2 years ago Hyperglycemia   [facility] Bosie Clos, MD

## 2023-05-25 ENCOUNTER — Other Ambulatory Visit: Payer: Self-pay | Admitting: Family Medicine

## 2023-05-25 ENCOUNTER — Telehealth: Payer: Self-pay | Admitting: Family Medicine

## 2023-05-25 NOTE — Telephone Encounter (Signed)
Called patient to inquire about her current PCP. Patient states that she has followed Dr. Sullivan Lone to his new office in [place]. Patient states that she would like to keep Dr. Sullivan Lone as her PCP. Advised patient to call Dr. Elisabeth Cara office at [facility] for additional refills for medications. Please advise patient if additional information is needed to transfer her information to [facility].

## 2023-05-25 NOTE — Telephone Encounter (Signed)
Medication Refill - Medication: gabapentin (NEURONTIN) 300 MG capsule  Med, denied because , Dr Sullivan Lone isnt at practice anymore, but this can be filled by Dr Sharol Harness correct?  Has the patient contacted their pharmacy? yes (Agent: If no, request that the patient contact the pharmacy for the refill. If patient does not wish to contact the pharmacy document the reason why and proceed with request.) (Agent: If yes, when and what did the pharmacy advise?)contact pcp  Preferred Pharmacy (with phone number or street name):  [id number] - [place],  - [address] Phone: [phone]  Fax: [phone]     Has the patient been seen for an appointment in the last year OR does the patient have an upcoming appointment? yes  Agent: Please be advised that RX refills may take up to 3 business days. We ask that you follow-up with your pharmacy.

## 2023-05-26 NOTE — Telephone Encounter (Signed)
Requested Prescriptions  Refused Prescriptions Disp Refills   gabapentin (NEURONTIN) 300 MG capsule [Pharmacy Med Name: GABAPENTIN 300 MG CAPSULE] 270 capsule 0    Sig: TAKE ONE BY MOUTH CAPSULE IN THE MORNING, 2 CAPSULES IN THE EVENING     Neurology: Anticonvulsants - gabapentin Failed - 05/25/2023  2:13 PM      Failed - Cr in normal range and within 360 days    Creat  Date Value Ref Range Status  10/21/2017 0.70 0.60 - 0.93 mg/dL Final    Comment:    For patients >51 years of age, the reference limit for Creatinine is approximately 13% higher for people identified as African-American. .    Creatinine, Ser  Date Value Ref Range Status  09/03/2022 1.07 (H) 0.57 - 1.00 mg/dL Final         Passed - Completed PHQ-2 or PHQ-9 in the last 360 days      Passed - Valid encounter within last 12 months    Recent Outpatient Visits           6 months ago COVID-19   [facility] [place], [place], PA-C   8 months ago MCI (mild cognitive impairment)   [facility] Bosie Clos, MD   1 year ago Neuropathy   [facility] Bosie Clos, MD   1 year ago Hyperglycemia   [facility] Bosie Clos, MD   2 years ago Hyperglycemia   [facility] Bosie Clos, MD       Future Appointments             In 3 weeks Simmons-Robinson, Tawanna Cooler, MD [facility]

## 2023-06-03 DIAGNOSIS — Z0000 Encounter for general adult medical examination without abnormal findings: Secondary | ICD-10-CM | POA: Diagnosis not present

## 2023-06-03 DIAGNOSIS — G629 Polyneuropathy, unspecified: Secondary | ICD-10-CM | POA: Diagnosis not present

## 2023-06-03 DIAGNOSIS — E7800 Pure hypercholesterolemia, unspecified: Secondary | ICD-10-CM | POA: Diagnosis not present

## 2023-06-03 DIAGNOSIS — I255 Ischemic cardiomyopathy: Secondary | ICD-10-CM | POA: Diagnosis not present

## 2023-06-03 DIAGNOSIS — R739 Hyperglycemia, unspecified: Secondary | ICD-10-CM | POA: Diagnosis not present

## 2023-06-03 DIAGNOSIS — Z951 Presence of aortocoronary bypass graft: Secondary | ICD-10-CM | POA: Diagnosis not present

## 2023-06-03 DIAGNOSIS — I5022 Chronic systolic (congestive) heart failure: Secondary | ICD-10-CM | POA: Diagnosis not present

## 2023-06-04 DIAGNOSIS — D0471 Carcinoma in situ of skin of right lower limb, including hip: Secondary | ICD-10-CM | POA: Diagnosis not present

## 2023-06-04 DIAGNOSIS — D0461 Carcinoma in situ of skin of right upper limb, including shoulder: Secondary | ICD-10-CM | POA: Diagnosis not present

## 2023-06-04 DIAGNOSIS — L570 Actinic keratosis: Secondary | ICD-10-CM | POA: Diagnosis not present

## 2023-06-17 ENCOUNTER — Encounter: Payer: Medicare HMO | Admitting: Family Medicine

## 2023-06-17 DIAGNOSIS — Z961 Presence of intraocular lens: Secondary | ICD-10-CM | POA: Diagnosis not present

## 2023-06-17 DIAGNOSIS — H26493 Other secondary cataract, bilateral: Secondary | ICD-10-CM | POA: Diagnosis not present

## 2023-07-20 ENCOUNTER — Other Ambulatory Visit: Payer: Self-pay

## 2023-07-20 ENCOUNTER — Emergency Department: Payer: Medicare HMO

## 2023-07-20 ENCOUNTER — Emergency Department
Admission: EM | Admit: 2023-07-20 | Discharge: 2023-07-20 | Disposition: A | Discharge status: Home / Self Care | Payer: Medicare HMO | Attending: Emergency Medicine | Admitting: Emergency Medicine

## 2023-07-20 DIAGNOSIS — I480 Paroxysmal atrial fibrillation: Secondary | ICD-10-CM

## 2023-07-20 DIAGNOSIS — R0989 Other specified symptoms and signs involving the circulatory and respiratory systems: Secondary | ICD-10-CM | POA: Diagnosis not present

## 2023-07-20 DIAGNOSIS — R079 Chest pain, unspecified: Secondary | ICD-10-CM | POA: Diagnosis not present

## 2023-07-20 DIAGNOSIS — R002 Palpitations: Secondary | ICD-10-CM | POA: Diagnosis not present

## 2023-07-20 LAB — BASIC METABOLIC PANEL
Anion gap: 10 (ref 5–15)
BUN: 17 mg/dL (ref 8–23)
CO2: 25 mmol/L (ref 22–32)
Calcium: 9 mg/dL (ref 8.9–10.3)
Chloride: 106 mmol/L (ref 98–111)
Creatinine, Ser: 1.02 mg/dL — ABNORMAL HIGH (ref 0.44–1.00)
GFR, Estimated: 55 mL/min — ABNORMAL LOW (ref >60)
Glucose, Bld: 99 mg/dL (ref 70–99)
Potassium: 4.3 mmol/L (ref 3.5–5.1)
Sodium: 141 mmol/L (ref 135–145)

## 2023-07-20 LAB — CBC
HCT: 46 % (ref 36.0–46.0)
Hemoglobin: 14.6 g/dL (ref 12.0–15.0)
MCH: 30.7 pg (ref 26.0–34.0)
MCHC: 31.7 g/dL (ref 30.0–36.0)
MCV: 96.8 fL (ref 80.0–100.0)
Platelets: 201 10*3/uL (ref 150–400)
RBC: 4.75 MIL/uL (ref 3.87–5.11)
RDW: 14.1 % (ref 11.5–15.5)
WBC: 5.2 10*3/uL (ref 4.0–10.5)
nRBC: 0 % (ref 0.0–0.2)

## 2023-07-20 LAB — TROPONIN I (HIGH SENSITIVITY)
Troponin I (High Sensitivity): 25 ng/L — ABNORMAL HIGH (ref <18)
Troponin I (High Sensitivity): 24 ng/L — ABNORMAL HIGH (ref <18)

## 2023-07-20 MED ORDER — SODIUM CHLORIDE 0.9 % IV BOLUS
500.0000 mL | Freq: Once | INTRAVENOUS | Status: AC
  Administered 2023-07-20: 500 mL via INTRAVENOUS

## 2023-07-20 MED ORDER — DILTIAZEM HCL 25 MG/5ML IV SOLN
15.0000 mg | Freq: Once | INTRAVENOUS | Status: AC
  Administered 2023-07-20: 15 mg via INTRAVENOUS
  Filled 2023-07-20: qty 5

## 2023-07-20 NOTE — Discharge Instructions (Signed)
Please seek medical attention for any high fevers, chest pain, shortness of breath, change in behavior, persistent vomiting, bloody stool or any other new or concerning symptoms.  

## 2023-07-20 NOTE — ED Provider Notes (Signed)
[facility] Provider Note    Event Date/Time   First MD Initiated Contact with Patient 07/20/23 1125     (approximate)   History   Weakness   HPI  Teresa [name] is a 82 y.o. female who presents to the emergency department today because of concerns for palpitations and weakness.  Patient noticed the symptoms starting yesterday.  Today when she woke up he continued to have the palpitations.  Does have history of A-fib and had an ablation performed many years ago.  States that she does sometimes feel it go in and out.  Patient is on metoprolol.  Denies missing any recent doses.  She denies any recent fevers or illness.     Physical Exam   Triage Vital Signs: ED Triage Vitals  Encounter Vitals Group     BP 07/20/23 1110 (!) 120/94     Systolic BP Percentile --      Diastolic BP Percentile --      Pulse Rate 07/20/23 1110 (!) 150     Resp 07/20/23 1110 18     Temp 07/20/23 1112 98 F (36.7 C)     Temp Source 07/20/23 1112 Oral     SpO2 07/20/23 1110 98 %     Weight 07/20/23 1112 164 lb 14.5 oz (74.8 kg)     Height 07/20/23 1112 5\' 4"  (1.626 m)     Head Circumference --      Peak Flow --      Pain Score 07/20/23 1111 0     Pain Loc --      Pain Education --      Exclude from Growth Chart --     Most recent vital signs: Vitals:   07/20/23 1430 07/20/23 1450  BP:  122/65  Pulse: 73 (!) 42  Resp: (!) 22 (!) 22  Temp:    SpO2: 100% 95%   General: Awake, alert, oriented. CV:  Good peripheral perfusion. Irregularly irregular rhythm. Resp:  Normal effort. Lungs clear. Abd:  No distention.    ED Results / Procedures / Treatments   Labs (all labs ordered are listed, but only abnormal results are displayed) Labs Reviewed  BASIC METABOLIC PANEL - Abnormal; Notable for the following components:      Result Value   Creatinine, Ser 1.02 (*)    GFR, Estimated 55 (*)    All other components within normal limits  TROPONIN I (HIGH SENSITIVITY) -  Abnormal; Notable for the following components:   Troponin I (High Sensitivity) 25 (*)    All other components within normal limits  TROPONIN I (HIGH SENSITIVITY) - Abnormal; Notable for the following components:   Troponin I (High Sensitivity) 24 (*)    All other components within normal limits  CBC     EKG  I, Phineas Semen, attending physician, personally viewed and interpreted this EKG  EKG Time: 1114 Rate: 155 Rhythm: afib with rvr Axis: left axis deviation Intervals: qtc 562 QRS: IVCD ST changes: no st elevation Impression: abnormal ekg    RADIOLOGY I independently interpreted and visualized the CXR. My interpretation: No pneumonia Radiology interpretation:  IMPRESSION:  Low volume film with pulmonary vascular congestion.      PROCEDURES:  Critical Care performed: No   MEDICATIONS ORDERED IN ED: Medications  diltiazem (CARDIZEM) injection 15 mg (15 mg Intravenous Given 07/20/23 1132)  sodium chloride 0.9 % bolus 500 mL (0 mLs Intravenous Stopped 07/20/23 1316)     IMPRESSION / MDM /  ASSESSMENT AND PLAN / ED COURSE  I reviewed the triage vital signs and the nursing notes.                              Differential diagnosis includes, but is not limited to, atrial fibrillation, infection, anemia, dehydration.  Patient's presentation is most consistent with acute presentation with potential threat to life or bodily function.   The patient is on the cardiac monitor to evaluate for evidence of arrhythmia and/or significant heart rate changes.  Patient presented to the emergency department today because of concerns for weakness and tachycardia.  Patient has history of A-fib.  Initial EKG is concerning for A-fib but with aberrant conduction.  Patient has history of IVCD.  Patient was given diltiazem which did slow down her heart rate.  After that she started having intermittent episodes of A-fib.  She was then converted back into normal sinus rhythm.  She did  feel improved with the improvement of her heart rate.  Blood work with initially slightly elevated troponin, repeat is improved.  At this time I have low suspicion for ACS.  Will plan on discharging.  Discussed the patient importance of following up with cardiology.      FINAL CLINICAL IMPRESSION(S) / ED DIAGNOSES   Final diagnoses:  Paroxysmal atrial fibrillation (HCC)     Rx / DC Orders   ED Discharge Orders     None        Note:  This document was prepared using Dragon voice recognition software and may include unintentional dictation errors.    Phineas Semen, MD 07/20/23 [phone]

## 2023-07-20 NOTE — ED Triage Notes (Signed)
Pt here with weakness since yesterday. Pt states her bp was low and her HR was low but woke up today feeling the same. HR in the 150s on arrival to ED.

## 2023-07-21 DIAGNOSIS — I480 Paroxysmal atrial fibrillation: Secondary | ICD-10-CM | POA: Diagnosis not present

## 2023-07-21 DIAGNOSIS — I509 Heart failure, unspecified: Secondary | ICD-10-CM | POA: Diagnosis not present

## 2023-07-21 NOTE — Progress Notes (Signed)
 ELECTROPHYSIOLOGY -  RETURN VISIT   PRIMARY CARE PROVIDER:  Bertrum Charlie Raring, MD PRIMARY ELECTROPHYSIOLOGIST: Dr. Ubaldo Mace                 Maigen, Mozingo 07/21/2023  DOB: August 24, 1941 Age: 82 y.o.   HPI   PATIENT PROFILE: Teresa [name]  is a 82 y.o. female who presents for follow up of:   Atrial fibrillation PVI 05/2019 [facility] and could not tolerate medication  CLINICAL SUMMARY: Paroxysmal atrial fibrillation status post PVI 05/2019 CHA2DS2-VASc 5 (CHF, HTN, age >47, vascular disease, female) OAC with Eliquis   PVCs  CAD s/p CABG 1994  Ischemic cardiomyopathy, LVEF 45% by echocardiogram 05/31/2021 HTN Hyperlipidemia COPD Breast cancer  INTERIM HISTORY: [name] was last seen by [facility] on 10/29/2021 with Leita Roys, PA. At that visit, she had denied any known recurrence of atrial fibrillation after her ablation with Dr. Ubaldo Mace on 05/27/2019. She has not been seen since that time, but was maintained on Toprol  XL 50mg  and Eliquis  5mg  BID.   She presents to clinic today with her daughter. Starting about a week ago, she noticed an increase in SOB, weakness, lightheadedness/dizziness, and palpitations. Bert is quite low. She was without her Entresto  for a few months after Dr. Micky left the practice and missed her Toprol  XL for about a week. She was supposed to reestablish with cardiology with Dr. Antonieta recently, but had to cancel the appointment because her daughter hurt her back. She felt so poorly yesterday that she was seen at [facility] for palpitations and weakness. She was found to be in AF with RVR and was given diltiazem  which slowed her HR and ultimately converted back to NSR. She presents to EP clinic today for follow-up atrial fibrillation care.   Pertinent details of the medical, social and family history were reviewed and are unchanged except as noted.  HISTORY   PROBLEM LIST: Patient Active Problem List  Diagnosis   . S/P CABG (coronary artery bypass graft)  . GERD (gastroesophageal reflux disease)  . Breast CA (CMS/HHS-HCC)  . Coronary disease  . Breast cancer (GWK)  . Osteoarthritis(GWK)  . Lumbosacral radiculopathy  . Intervertebral disc disorder with radiculopathy of lumbar region  . DDD (degenerative disc disease), lumbar  . Status post total left knee replacement  . Gastro-esophageal reflux disease without esophagitis  . Atherosclerotic heart disease of native coronary artery without angina pectoris  . Hyperlipidemia  . Ischemic cardiomyopathy  . Radiculopathy of lumbosacral region  . Syncope and collapse  . Psoriasis, unspecified  . Ventricular premature depolarization  . Primary osteoarthritis of both first carpometacarpal joints  . Heberden's nodes  . Acid reflux  . Arthritis, degenerative  . Malignant neoplasm of female breast (CMS/HHS-HCC)  . CAD (coronary artery disease)  . L-S radiculopathy  . Pre-syncope  . PVC's (premature ventricular contractions)  . Pain syndrome, chronic  . Lumbar polyradiculopathy  . Psoriatic arthritis (CMS/HHS-HCC)  . Encounter for long-term (current) use of high-risk medication  . Paroxysmal atrial fibrillation (CMS/HHS-HCC)  . Fusion of spine of cervical region  . Anticoagulated  . Status post catheter ablation of atrial fibrillation  . Asthma (HHS-HCC)  . Chronic obstructive pulmonary disease (CMS/HHS-HCC)  . Rheumatoid arthritis (CMS/HHS-HCC)  . Unstable angina (CMS/HHS-HCC)  . Dog bite of right lower leg with infection  . Need for Tdap vaccination  . Neuropathy  . Puncture wound  . Status post total hip replacement, right  . Prediabetes  PROBLEMS ADDRESSED AT THIS VISIT: Paroxysmal atrial fibrillation (CMS/HHS-HCC)  (primary encounter diagnosis) Chronic systolic CHF (congestive heart failure) (CMS/HHS-HCC) Atrial fibrillation with rapid ventricular response (CMS/HHS-HCC)   CURRENT MEDICATIONS AND ALLERGIES   Current Outpatient  Medications  Medication Sig Dispense Refill  . acetaminophen  (TYLENOL ) 650 MG ER tablet 1 tablet every 8-12 hours as needed for joint pain, 90 days 270 tablet 1  . apixaban  (ELIQUIS ) 5 mg tablet Take 1 tablet (5 mg total) by mouth 2 (two) times daily 180 tablet 3  . apremilast  (OTEZLA ) 30 mg tablet Take 1 tablet (30 mg total) by mouth 2 (two) times daily 180 tablet 3  . cetirizine  (ZYRTEC ) 10 MG tablet Take 10 mg by mouth once daily    . cholecalciferol (VITAMIN D3) 1000 unit capsule Take 1,000 Units by mouth once daily       . donepeziL  (ARICEPT ) 5 MG tablet Take 1 tablet (5 mg total) by mouth at bedtime 90 tablet 1  . fluticasone  (FLONASE ) 50 mcg/actuation nasal spray Place 2 sprays into both nostrils once daily as needed    12  . gabapentin  (NEURONTIN ) 300 MG capsule Take 1 capsule (300 mg total) by mouth 2 (two) times daily Do not take with other neuroleptics (Lyrica , cymbalta, amitriptyline, etc). 180 capsule 1  . memantine  (NAMENDA ) 10 MG tablet take 1 tablet by mouth twice a day 180 tablet 0  . metoprolol  succinate (TOPROL -XL) 50 MG XL tablet Take 1 tablet (50 mg total) by mouth once daily 30 tablet 0  . multivitamin capsule Take 1 capsule by mouth once daily    . rosuvastatin  (CRESTOR ) 5 MG tablet Take 5 mg by mouth once daily    . sacubitriL -valsartan  (ENTRESTO ) 24-26 mg tablet Take 1 tablet by mouth 2 (two) times daily 180 tablet 3  . traMADoL  (ULTRAM ) 50 mg tablet Take 1 tablet (50 mg total) by mouth every 6 (six) hours as needed for Pain 60 tablet 1   No current facility-administered medications for this visit.    Allergies  Allergen Reactions  . Codeine Other (See Comments)    ALTERED MENTAL STATUS  . Iodinated Contrast Media Swelling  . Sulfa (Sulfonamide Antibiotics) Hives  . Amiodarone  Nausea And Vomiting   An updated, reconciled list of all medications, including OTC and herbal, was reviewed and a copy of the updated list was provided to the patient. Treatment plan and  medications were discussed with the patient who voiced understanding. No barriers to learning.   REVIEW OF SYSTEMS   Comprehensive review of systems is completed and is negative except as indicated in the HPI.  PHYSICAL EXAM   Vitals:   07/21/23 1317  BP: 117/60  Pulse: (!) 142  Resp: 20   rechecked 138/68  Ht:162.6 cm (5' 4) Wt:78.2 kg (172 lb 6.4 oz) ADJ:Anib surface area is 1.88 meters squared.   Physical Exam Constitutional:      General: She is not in acute distress. HENT:     Head: Normocephalic and atraumatic.  Eyes:     Extraocular Movements: Extraocular movements intact.  Neck:     Vascular: No carotid bruit or JVD.  Cardiovascular:     Rate and Rhythm: Tachycardia present. Rhythm irregular. No extrasystoles are present.    Pulses: Intact distal pulses.     Heart sounds: Normal heart sounds.  Pulmonary:     Effort: No respiratory distress.     Breath sounds: Normal breath sounds. No wheezing, rhonchi or rales.  Abdominal:  Palpations: Abdomen is soft.     Tenderness: There is no abdominal tenderness.  Musculoskeletal:        General: No swelling.     Right lower leg: Edema (+1) present.     Left lower leg: Edema (+1) present.  Skin:    General: Skin is warm and dry.  Neurological:     General: No focal deficit present.     Mental Status: She is alert and oriented to person, place, and time.  Psychiatric:        Behavior: Behavior normal.     DATA/RESULTS   Labs: Hematology Lab Results  Component Value Date   WBC 5.5 03/01/2021   HGB 13.7 03/01/2021   HCT 44.1 03/01/2021   MCV 97 03/01/2021   PLT 223 03/01/2021   Chemistries Lab Results  Component Value Date   CREATININE 1.1 06/03/2023   BUN 19 06/03/2023   NA 145 06/03/2023   K 5.5 (H) 06/03/2023   CL 107 06/03/2023   CO2 31.7 06/03/2023   Liver Function Studies Lab Results  Component Value Date   ALT 10 06/03/2023   AST 18 06/03/2023   ALKPHOS 72 06/03/2023   Thyroid   Function Studies Lab Results  Component Value Date   TSH 0.42 09/26/2018   INR Lab Results  Component Value Date   INR 1.3 (H) 10/07/2019   INR 1.8 (H) 05/25/2019   EKG: 07/21/2023 personally reviewed by me: Atrial fibrillation @ 108bpm.   ASSESSMENT AND PLAN   Paroxysmal atrial fibrillation status post ablation with known recurrence Order DCCV for AF recurrence Order ECHO to assess EF. Known CHF but has been off Entresto  for a few months. Reorder Entresto  and Toprol  XL for patient Reestablish patient with Dr. Antonieta in Cardiology Speak with Dr. Leonce about how to manage AF recurrence.  With her known recurrence and symptoms of atrial fibrillation, it is pertinent that we try and maintain rhythm control for Ms. Laurich. Would like to speak with Dr. Leonce to determine management for ongoing atrial fibrillation. Has a history of CHF with reduced EF ~ 45% in 2022. She did not tolerate amiodarone  when she had previously trialed it in the past. With her sinus rate in the low 60s, I do not know if Dr. Leonce would want to consider treatment with AAD.    Orders Placed This Encounter  Procedures  . EP procedure request to EP lab  . ECG 12-lead  . Echo complete    DISPOSITION   No follow-ups on file.  I spent a total of 40-54 minutes in both face-to-face and non-face-to-face activities for this visit on the date of this encounter.    Attestation Statement:   I personally performed the service, non-incident to. (WP)   BRIANA NAT KEEPERS, NP

## 2023-07-23 DIAGNOSIS — I255 Ischemic cardiomyopathy: Secondary | ICD-10-CM | POA: Diagnosis not present

## 2023-07-23 DIAGNOSIS — I440 Atrioventricular block, first degree: Secondary | ICD-10-CM | POA: Diagnosis not present

## 2023-07-23 DIAGNOSIS — R9431 Abnormal electrocardiogram [ECG] [EKG]: Secondary | ICD-10-CM | POA: Diagnosis not present

## 2023-07-23 DIAGNOSIS — Z7901 Long term (current) use of anticoagulants: Secondary | ICD-10-CM | POA: Diagnosis not present

## 2023-07-23 DIAGNOSIS — I480 Paroxysmal atrial fibrillation: Secondary | ICD-10-CM | POA: Diagnosis not present

## 2023-07-23 DIAGNOSIS — I447 Left bundle-branch block, unspecified: Secondary | ICD-10-CM | POA: Diagnosis not present

## 2023-07-23 DIAGNOSIS — I493 Ventricular premature depolarization: Secondary | ICD-10-CM | POA: Diagnosis not present

## 2023-07-23 DIAGNOSIS — Z01810 Encounter for preprocedural cardiovascular examination: Secondary | ICD-10-CM | POA: Diagnosis not present

## 2023-07-27 ENCOUNTER — Telehealth: Payer: Self-pay

## 2023-07-27 NOTE — Telephone Encounter (Signed)
Transition Care Management Follow-up Telephone Call Date of discharge and from where: 07/20/2023 [facility] How have you been since you were released from the hospital? Patient stated she is feeling better. Any questions or concerns? No  Items Reviewed: Did the pt receive and understand the discharge instructions provided? Yes  Medications obtained and verified?  No medication prescribed. Other? No  Any new allergies since your discharge? No  Dietary orders reviewed? Yes Do you have support at home? Yes   Follow up appointments reviewed:  PCP Hospital f/u appt confirmed? Yes  Scheduled to see Julieanne Manson, MD on 07/28/2023 @ [facility]. Specialist Hospital f/u appt confirmed? Yes  Scheduled to see Glennis Brink, MD on 07/23/2023 @ [facility]. Are transportation arrangements needed? No  If their condition worsens, is the pt aware to call PCP or go to the Emergency Dept.? Yes Was the patient provided with contact information for the PCP's office or ED? Yes Was to pt encouraged to call back with questions or concerns? Yes   [facility]  [facility] Population Health Community Resource Care Guide   [email or web address]  ?? [phone]   Website: [email or web address]  [email or web address]

## 2023-07-28 DIAGNOSIS — F0280 Dementia in other diseases classified elsewhere without behavioral disturbance: Secondary | ICD-10-CM | POA: Diagnosis not present

## 2023-07-28 DIAGNOSIS — I480 Paroxysmal atrial fibrillation: Secondary | ICD-10-CM | POA: Diagnosis not present

## 2023-07-28 DIAGNOSIS — I5022 Chronic systolic (congestive) heart failure: Secondary | ICD-10-CM | POA: Diagnosis not present

## 2023-07-28 DIAGNOSIS — Z951 Presence of aortocoronary bypass graft: Secondary | ICD-10-CM | POA: Diagnosis not present

## 2023-07-28 DIAGNOSIS — G309 Alzheimer's disease, unspecified: Secondary | ICD-10-CM | POA: Diagnosis not present

## 2023-07-28 DIAGNOSIS — I255 Ischemic cardiomyopathy: Secondary | ICD-10-CM | POA: Diagnosis not present

## 2023-08-04 DIAGNOSIS — I4891 Unspecified atrial fibrillation: Secondary | ICD-10-CM | POA: Diagnosis not present

## 2023-08-04 DIAGNOSIS — D6869 Other thrombophilia: Secondary | ICD-10-CM | POA: Diagnosis not present

## 2023-08-04 DIAGNOSIS — G309 Alzheimer's disease, unspecified: Secondary | ICD-10-CM | POA: Diagnosis not present

## 2023-08-04 DIAGNOSIS — M1990 Unspecified osteoarthritis, unspecified site: Secondary | ICD-10-CM | POA: Diagnosis not present

## 2023-08-04 DIAGNOSIS — I10 Essential (primary) hypertension: Secondary | ICD-10-CM | POA: Diagnosis not present

## 2023-08-04 DIAGNOSIS — J309 Allergic rhinitis, unspecified: Secondary | ICD-10-CM | POA: Diagnosis not present

## 2023-08-04 DIAGNOSIS — E785 Hyperlipidemia, unspecified: Secondary | ICD-10-CM | POA: Diagnosis not present

## 2023-08-04 DIAGNOSIS — I2510 Atherosclerotic heart disease of native coronary artery without angina pectoris: Secondary | ICD-10-CM | POA: Diagnosis not present

## 2023-08-04 DIAGNOSIS — Z8249 Family history of ischemic heart disease and other diseases of the circulatory system: Secondary | ICD-10-CM | POA: Diagnosis not present

## 2023-08-04 DIAGNOSIS — Z008 Encounter for other general examination: Secondary | ICD-10-CM | POA: Diagnosis not present

## 2023-08-04 DIAGNOSIS — M545 Low back pain, unspecified: Secondary | ICD-10-CM | POA: Diagnosis not present

## 2023-08-04 DIAGNOSIS — F325 Major depressive disorder, single episode, in full remission: Secondary | ICD-10-CM | POA: Diagnosis not present

## 2023-08-04 DIAGNOSIS — M810 Age-related osteoporosis without current pathological fracture: Secondary | ICD-10-CM | POA: Diagnosis not present

## 2023-08-10 DIAGNOSIS — I5022 Chronic systolic (congestive) heart failure: Secondary | ICD-10-CM | POA: Diagnosis not present

## 2023-08-18 DIAGNOSIS — L4050 Arthropathic psoriasis, unspecified: Secondary | ICD-10-CM | POA: Diagnosis not present

## 2023-08-18 DIAGNOSIS — M159 Polyosteoarthritis, unspecified: Secondary | ICD-10-CM | POA: Diagnosis not present

## 2023-08-18 DIAGNOSIS — L409 Psoriasis, unspecified: Secondary | ICD-10-CM | POA: Diagnosis not present

## 2023-08-18 DIAGNOSIS — Z7960 Long term (current) use of unspecified immunomodulators and immunosuppressants: Secondary | ICD-10-CM | POA: Diagnosis not present

## 2023-09-14 DIAGNOSIS — I110 Hypertensive heart disease with heart failure: Secondary | ICD-10-CM | POA: Diagnosis not present

## 2023-09-14 DIAGNOSIS — I452 Bifascicular block: Secondary | ICD-10-CM | POA: Diagnosis not present

## 2023-09-14 DIAGNOSIS — Z7901 Long term (current) use of anticoagulants: Secondary | ICD-10-CM | POA: Diagnosis not present

## 2023-09-14 DIAGNOSIS — I5022 Chronic systolic (congestive) heart failure: Secondary | ICD-10-CM | POA: Diagnosis not present

## 2023-09-14 DIAGNOSIS — R001 Bradycardia, unspecified: Secondary | ICD-10-CM | POA: Diagnosis not present

## 2023-09-14 DIAGNOSIS — I2510 Atherosclerotic heart disease of native coronary artery without angina pectoris: Secondary | ICD-10-CM | POA: Diagnosis not present

## 2023-09-14 DIAGNOSIS — I255 Ischemic cardiomyopathy: Secondary | ICD-10-CM | POA: Diagnosis not present

## 2023-09-14 DIAGNOSIS — Z79899 Other long term (current) drug therapy: Secondary | ICD-10-CM | POA: Diagnosis not present

## 2023-09-14 DIAGNOSIS — I480 Paroxysmal atrial fibrillation: Secondary | ICD-10-CM | POA: Diagnosis not present

## 2023-09-14 DIAGNOSIS — R9431 Abnormal electrocardiogram [ECG] [EKG]: Secondary | ICD-10-CM | POA: Diagnosis not present

## 2023-09-14 DIAGNOSIS — Z951 Presence of aortocoronary bypass graft: Secondary | ICD-10-CM | POA: Diagnosis not present

## 2023-09-14 DIAGNOSIS — Z87891 Personal history of nicotine dependence: Secondary | ICD-10-CM | POA: Diagnosis not present

## 2023-10-06 DIAGNOSIS — Z853 Personal history of malignant neoplasm of breast: Secondary | ICD-10-CM | POA: Diagnosis not present

## 2023-10-06 DIAGNOSIS — F0280 Dementia in other diseases classified elsewhere without behavioral disturbance: Secondary | ICD-10-CM | POA: Diagnosis not present

## 2023-10-06 DIAGNOSIS — L4050 Arthropathic psoriasis, unspecified: Secondary | ICD-10-CM | POA: Diagnosis not present

## 2023-10-06 DIAGNOSIS — I5022 Chronic systolic (congestive) heart failure: Secondary | ICD-10-CM | POA: Diagnosis not present

## 2023-10-06 DIAGNOSIS — G309 Alzheimer's disease, unspecified: Secondary | ICD-10-CM | POA: Diagnosis not present

## 2023-10-06 DIAGNOSIS — J432 Centrilobular emphysema: Secondary | ICD-10-CM | POA: Diagnosis not present

## 2023-10-06 DIAGNOSIS — Z951 Presence of aortocoronary bypass graft: Secondary | ICD-10-CM | POA: Diagnosis not present

## 2023-10-06 DIAGNOSIS — R634 Abnormal weight loss: Secondary | ICD-10-CM | POA: Diagnosis not present

## 2023-10-20 DIAGNOSIS — I498 Other specified cardiac arrhythmias: Secondary | ICD-10-CM | POA: Diagnosis not present

## 2023-10-20 DIAGNOSIS — I440 Atrioventricular block, first degree: Secondary | ICD-10-CM | POA: Diagnosis not present

## 2023-10-20 DIAGNOSIS — I447 Left bundle-branch block, unspecified: Secondary | ICD-10-CM | POA: Diagnosis not present

## 2023-10-20 DIAGNOSIS — Z9889 Other specified postprocedural states: Secondary | ICD-10-CM | POA: Diagnosis not present

## 2023-10-20 DIAGNOSIS — I480 Paroxysmal atrial fibrillation: Secondary | ICD-10-CM | POA: Diagnosis not present

## 2023-10-20 DIAGNOSIS — Z7901 Long term (current) use of anticoagulants: Secondary | ICD-10-CM | POA: Diagnosis not present

## 2023-10-28 DIAGNOSIS — Z23 Encounter for immunization: Secondary | ICD-10-CM | POA: Diagnosis not present

## 2023-10-28 DIAGNOSIS — M545 Low back pain, unspecified: Secondary | ICD-10-CM | POA: Diagnosis not present

## 2023-10-28 DIAGNOSIS — G8929 Other chronic pain: Secondary | ICD-10-CM | POA: Diagnosis not present

## 2023-11-17 DIAGNOSIS — M6281 Muscle weakness (generalized): Secondary | ICD-10-CM | POA: Diagnosis not present

## 2023-11-17 DIAGNOSIS — M5441 Lumbago with sciatica, right side: Secondary | ICD-10-CM | POA: Diagnosis not present

## 2023-11-24 DIAGNOSIS — M6281 Muscle weakness (generalized): Secondary | ICD-10-CM | POA: Diagnosis not present

## 2023-11-24 DIAGNOSIS — M5441 Lumbago with sciatica, right side: Secondary | ICD-10-CM | POA: Diagnosis not present

## 2023-11-26 DIAGNOSIS — M5441 Lumbago with sciatica, right side: Secondary | ICD-10-CM | POA: Diagnosis not present

## 2023-11-26 DIAGNOSIS — M6281 Muscle weakness (generalized): Secondary | ICD-10-CM | POA: Diagnosis not present

## 2023-12-01 DIAGNOSIS — M5441 Lumbago with sciatica, right side: Secondary | ICD-10-CM | POA: Diagnosis not present

## 2023-12-01 DIAGNOSIS — M6281 Muscle weakness (generalized): Secondary | ICD-10-CM | POA: Diagnosis not present

## 2023-12-30 DIAGNOSIS — G629 Polyneuropathy, unspecified: Secondary | ICD-10-CM | POA: Diagnosis not present

## 2023-12-30 DIAGNOSIS — I255 Ischemic cardiomyopathy: Secondary | ICD-10-CM | POA: Diagnosis not present

## 2023-12-30 DIAGNOSIS — I480 Paroxysmal atrial fibrillation: Secondary | ICD-10-CM | POA: Diagnosis not present

## 2023-12-30 DIAGNOSIS — F0390 Unspecified dementia without behavioral disturbance: Secondary | ICD-10-CM | POA: Diagnosis not present

## 2023-12-30 DIAGNOSIS — M150 Primary generalized (osteo)arthritis: Secondary | ICD-10-CM | POA: Diagnosis not present

## 2023-12-30 DIAGNOSIS — Z951 Presence of aortocoronary bypass graft: Secondary | ICD-10-CM | POA: Diagnosis not present

## 2024-01-14 NOTE — Progress Notes (Signed)
 Rheumatology Follow Up Note  Chief Complaint  Patient presents with  . Psoriatic Arthritis      Subjective:Hip Pain  Pertinent negatives include no numbness.    Teresa [name] is a 83 y.o. female is here today for follow up of Psoriatic arthritis. The patient's allergies, current medications, past family history, past medical history, past social history, past surgical history and problem list were reviewed and updated as appropriate.   She is taking the Otezla . She is tolerating this well. The psoriasis is stable on her legs, arms and scalp this is well controlled. She has no swelling of the hand joints. She is able to form a fist. The grip strength is better. She has DIP and PIP enlargement. She has no fever or infection.   She does take Tramadol  for pains. This helps her aching pains.   Review of Systems:   Review of Systems  Constitutional:  Negative for fatigue and fever.  HENT:  Negative for mouth sores and trouble swallowing.        Neg: Dry Mouth  Eyes:  Negative for redness.       Neg: Dry Eyes  Respiratory:  Negative for cough and shortness of breath.   Cardiovascular:  Negative for chest pain and leg swelling.  Gastrointestinal:  Negative for constipation, diarrhea and nausea.  Endocrine: Negative for cold intolerance and heat intolerance.  Genitourinary:  Negative for hematuria.  Musculoskeletal:        Per HPI  Skin:  Positive for rash. Negative for color change.  Neurological:  Negative for dizziness, weakness, numbness and headaches.  Hematological:  Does not bruise/bleed easily.  Psychiatric/Behavioral:  Negative for dysphoric mood and sleep disturbance. The patient is not nervous/anxious.   All other systems reviewed and are negative.  Objective:  Vitals:   01/14/24 1307  BP: 120/68  Temp: 36.6 C (97.9 F)  TempSrc: Temporal  Weight: 72.1 kg (158 lb 15.2 oz)  Height: 163.8 cm (5' 4.5)  PainSc:   2     Length of Stiffness: 30-40 minutes  GEN -  Pleasant, No Apparent Distress  HEENT - normocephalic and atraumatic. Conjunctiva Clear.  Neck - supple with no adenopathy or thyromegaly.   C spine with limited range of motion. Heart - regular rate and rhythm, No murmurs/gallops/rub, Nml S1S2 Lungs - clear to auscultation in all fields. Extremities - there is no cyanosis; Trace edema.  Neurological - alert and oriented.  Spine - no paraspinal tenderness; T spine and L Spine with tenderness; no SI joint tenderness Skin - Psoriasis mild of the lower leg, as well as on the face  MSK - The following joints were examined bilaterally: Hands, Wrists, Elbows, Shoulders, Metatarsals, Ankles, Knees and Hips; they were normal apart from what is noted.   100% Fist Formation DIP and PIP Enlargement, CMC Squaring No Synovitis or Dactylitis No T[place] Gait in wheelchair today  Labs/Imaging Reviewed in EMR Cr 1.02, AST 18, Alt 10  Normal CBC ESR 12; CRP 2 Pos: AntiCCP 26 Neg: Hep B, C, Quantiferon    Hand Xray: OA changes  Right His Xray: Mild to moderate degenerative changes of the right hip.    Assessment and Plan   1. Psoriasis with Psoriatic Arthritis: Stable -- Psoriasis present of the legs; no active synovitis on exam -- She is followed by Dermatology.  -- Continue Otezla  (Started 2018)   2) Osteoarthritis of multiple joints: most of the cause of her pains -- Cervical spine status post  C3 C7 posterior fusion October 2019.  -- Lumbar spine, prior epidural injection with Dr. Avanell  -- Both Hip Replacement -- Bilateral hands, knees -- Continue Tylenol  and Tramadol  for pain control     3) Long term use of high risk medication -- Otezla  is an immunosuppressive medication that requires drug toxicity monitoring  -- Check Labs  Diagnoses and all orders for this visit:  Psoriatic arthritis (CMS/HHS-HCC) -     CBC w/auto Differential (5 Part) -     Aspartate Aminotransferase (AST) -     Albumin -     Alanine Aminotransferase  (ALT) -     Creatinine -     Sedimentation Rate-Automated  Psoriasis  Primary osteoarthritis involving multiple joints  Encounter for long-term (current) use of high-risk medication -     CBC w/auto Differential (5 Part) -     Aspartate Aminotransferase (AST) -     Albumin -     Alanine Aminotransferase (ALT) -     Creatinine -     Sedimentation Rate-Automated     Return in about 6 months (around 07/13/2024) for Routine Follow Up.   All new prescription medications, changes in current prescription dosages, and sample medications were discussed with the patient, including patient education, medication name, use, dosage, potential side effects, drug interactions, consequences of not using/taking, and special instructions.  Patient expressed understanding.  No barriers to adherence.   I appreciate the opportunity to participate in the care of Teresa [name]. Please do not hesitate to contact me with any questions or concerns that may arise in regards to the patient's rheumatologic disease.   I personally performed the service. (TP)  MAYUR LOREE BLANCH, MD

## 2024-02-18 ENCOUNTER — Other Ambulatory Visit: Payer: Self-pay | Admitting: Family Medicine

## 2024-02-18 DIAGNOSIS — Z1231 Encounter for screening mammogram for malignant neoplasm of breast: Secondary | ICD-10-CM

## 2024-04-19 DIAGNOSIS — I440 Atrioventricular block, first degree: Secondary | ICD-10-CM | POA: Diagnosis not present

## 2024-04-19 DIAGNOSIS — I447 Left bundle-branch block, unspecified: Secondary | ICD-10-CM | POA: Diagnosis not present

## 2024-04-19 DIAGNOSIS — R001 Bradycardia, unspecified: Secondary | ICD-10-CM | POA: Diagnosis not present

## 2024-04-19 DIAGNOSIS — I491 Atrial premature depolarization: Secondary | ICD-10-CM | POA: Diagnosis not present

## 2024-04-19 DIAGNOSIS — Z9889 Other specified postprocedural states: Secondary | ICD-10-CM | POA: Diagnosis not present

## 2024-04-19 DIAGNOSIS — Z7901 Long term (current) use of anticoagulants: Secondary | ICD-10-CM | POA: Diagnosis not present

## 2024-04-19 DIAGNOSIS — I480 Paroxysmal atrial fibrillation: Secondary | ICD-10-CM | POA: Diagnosis not present

## 2024-04-19 NOTE — Progress Notes (Addendum)
 ELECTROPHYSIOLOGY -  RETURN VISIT   PRIMARY CARE PROVIDER:  Bertrum Charlie Raring, MD PRIMARY ELECTROPHYSIOLOGIST: Dr. Ubaldo Mace                 Veera, Stapleton 04/19/2024  DOB: 1941-06-30 Age: 83 y.o.   HPI   PATIENT PROFILE: Teresa [name]  is a 83 y.o. female who presents for follow up of:   Atrial fibrillation PVI 05/2019 Trialed Amio and could not tolerate medication DCCV 07/23/2023  CLINICAL SUMMARY: Paroxysmal atrial fibrillation status post PVI 05/2019 CHA2DS2-VASc 5 (CHF, HTN, age >29, vascular disease, female) OAC with Eliquis   PVCs  CAD s/p CABG 1994  Ischemic cardiomyopathy, LVEF 35% by echocardiogram 08/10/23 HTN Hyperlipidemia COPD Breast cancer  INTERIM HISTORY: Teresa [name] presents to clinic today with her daughter today. She reports that she has been doing quite since I last saw her on 10/20/2023.  She is unaware of any known recurrence of atrial fibrillation and checks through her blood pressure machine at home. She denies chest pain, chest pressure, syncope or near syncope. Her activities revolve around her dog and she is tolerating walks well. She is currently managed on Apix 5mg  BID and Toprol  XL 12.5mg  daily for rate control.  Does not qualify for reduced dose Eliquis  (84 years old, 71.8 kg, and creatinine of 1.1).  Reports good medication adherence and states she is tolerating these meds well.   Pertinent details of the medical, social and family history were reviewed and are unchanged except as noted.  HISTORY   PROBLEM LIST: Patient Active Problem List  Diagnosis  . S/P CABG (coronary artery bypass graft)  . GERD (gastroesophageal reflux disease)  . Breast CA (CMS/HHS-HCC)  . Coronary disease  . Breast cancer (GWK)  . Osteoarthritis(GWK)  . Lumbosacral radiculopathy  . Intervertebral disc disorder with radiculopathy of lumbar region  . DDD (degenerative disc disease), lumbar  . Status post total left knee replacement  . Gastro-esophageal  reflux disease without esophagitis  . Atherosclerotic heart disease of native coronary artery without angina pectoris  . Hyperlipidemia  . Ischemic cardiomyopathy  . Radiculopathy of lumbosacral region  . Syncope and collapse  . Psoriasis, unspecified  . Ventricular premature depolarization  . Primary osteoarthritis of both first carpometacarpal joints  . Heberden's nodes  . Acid reflux  . Arthritis, degenerative  . Malignant neoplasm of female breast (CMS/HHS-HCC)  . CAD (coronary artery disease)  . L-S radiculopathy  . Pre-syncope  . PVC's (premature ventricular contractions)  . Pain syndrome, chronic  . Lumbar polyradiculopathy  . Psoriatic arthritis (CMS/HHS-HCC)  . Encounter for long-term (current) use of high-risk medication  . Paroxysmal atrial fibrillation (CMS/HHS-HCC)  . Fusion of spine of cervical region  . Anticoagulated  . Status post catheter ablation of atrial fibrillation  . Asthma (HHS-HCC)  . Rheumatoid arthritis (CMS/HHS-HCC)  . Unstable angina (CMS/HHS-HCC)  . Dog bite of right lower leg with infection  . Need for Tdap vaccination  . Neuropathy  . Puncture wound  . Status post total hip replacement, right  . Prediabetes   PROBLEMS ADDRESSED AT THIS VISIT: No diagnosis found.   CURRENT MEDICATIONS AND ALLERGIES   Current Outpatient Medications  Medication Sig Dispense Refill  . acetaminophen  (TYLENOL ) 500 MG tablet Take 2 tablets (1,000 mg total) by mouth every 8 (eight) hours for 360 days 540 tablet 3  . apixaban  (ELIQUIS ) 5 mg tablet Take 1 tablet (5 mg total) by mouth 2 (two) times daily 180 tablet 3  .  apremilast  (OTEZLA ) 30 mg tablet Take 1 tablet (30 mg total) by mouth 2 (two) times daily 180 tablet 1  . cetirizine  (ZYRTEC ) 10 MG tablet Take 10 mg by mouth once daily    . cholecalciferol (VITAMIN D3) 1000 unit capsule Take 1,000 Units by mouth once daily       . donepeziL  (ARICEPT ) 5 MG tablet Take 1 tablet (5 mg total) by mouth at bedtime 90  tablet 1  . fluticasone  (FLONASE ) 50 mcg/actuation nasal spray Place 2 sprays into both nostrils once daily as needed    12  . gabapentin  (NEURONTIN ) 300 MG capsule Do not take with other neuroleptics (Lyrica , cymbalta, amitriptyline, etc).1 capsule in the morning, 2 tablets in the evening 270 capsule 1  . lidocaine  (LIDODERM ) 5 % patch APPLY 1 PATCH TO THE MOST PAINFUL AREA ON THE SKIN DAILY FOR 30 DAYS FOR UP TO 12 HOURS PER 24 HRS 30 patch 0  . memantine  (NAMENDA ) 10 MG tablet take 1 tablet by mouth twice a day 180 tablet 3  . metoprolol  succinate (TOPROL -XL) 25 MG XL tablet Take 0.5 tablets (12.5 mg total) by mouth once daily Take only if heart rate is above 50 beats per minute 180 tablet 0  . multivitamin capsule Take 1 capsule by mouth once daily    . rosuvastatin  (CRESTOR ) 5 MG tablet Take 1 tablet (5 mg total) by mouth once daily 90 tablet 3  . sacubitriL -valsartan  (ENTRESTO ) 24-26 mg tablet Take 1 tablet by mouth 2 (two) times daily 180 tablet 3  . traMADoL  (ULTRAM ) 50 mg tablet Take 1 tablet (50 mg total) by mouth every 12 (twelve) hours as needed for Pain 180 tablet 1   No current facility-administered medications for this visit.    Allergies  Allergen Reactions  . Codeine Other (See Comments)    ALTERED MENTAL STATUS  . Iodinated Contrast Media Swelling  . Sulfa (Sulfonamide Antibiotics) Hives  . Amiodarone  Nausea And Vomiting   An updated, reconciled list of all medications, including OTC and herbal, was reviewed and a copy of the updated list was provided to the patient. Treatment plan and medications were discussed with the patient who voiced understanding. No barriers to learning.   REVIEW OF SYSTEMS   Comprehensive review of systems is completed and is negative except as indicated in the HPI.  PHYSICAL EXAM   Vitals:   04/19/24 1519  BP: 131/69  Pulse: 73  Resp: 16     Ht:163.8 cm (5' 4.5) Wt:71.8 kg (158 lb 6.4 oz) ADJ:Anib surface area is 1.81 meters squared.    Physical Exam Constitutional:      General: She is not in acute distress. HENT:     Head: Normocephalic and atraumatic.  Eyes:     Extraocular Movements: Extraocular movements intact.  Neck:     Vascular: No carotid bruit or JVD.  Cardiovascular:     Rate and Rhythm: Normal rate and regular rhythm. No extrasystoles are present.    Pulses: Intact distal pulses.     Heart sounds: Normal heart sounds.  Pulmonary:     Effort: No respiratory distress.     Breath sounds: Normal breath sounds. No wheezing, rhonchi or rales.  Abdominal:     Palpations: Abdomen is soft.     Tenderness: There is no abdominal tenderness.  Musculoskeletal:        General: No swelling.     Right lower leg: No edema.     Left lower leg: No edema.  Skin:    General: Skin is warm and dry.  Neurological:     General: No focal deficit present.     Mental Status: She is alert and oriented to person, place, and time.  Psychiatric:        Behavior: Behavior normal.     DATA/RESULTS   Labs: Hematology Lab Results  Component Value Date   WBC 5.6 01/14/2024   HGB 15.3 (H) 01/14/2024   HCT 45.5 01/14/2024   MCV 97.4 01/14/2024   PLT 238 01/14/2024   Chemistries Lab Results  Component Value Date   CREATININE 1.1 01/14/2024   BUN 19 06/03/2023   NA 145 06/03/2023   K 5.5 (H) 06/03/2023   CL 107 06/03/2023   CO2 31.7 06/03/2023   Liver Function Studies Lab Results  Component Value Date   ALT 15 01/14/2024   AST 21 01/14/2024   ALKPHOS 72 06/03/2023   Thyroid  Function Studies Lab Results  Component Value Date   TSH 0.42 09/26/2018   INR Lab Results  Component Value Date   INR 1.3 (H) 10/07/2019   INR 1.8 (H) 05/25/2019   EKG: 04/19/2024 personally reviewed by me: sinus rhythm with PACs at 58bpm; 1st degree AV block; LBBB present and not new  Echo (08/10/23) -    MODERATE LV DYSFUNCTION (See above) WITH MILD LVH   ELEVATED LA PRESSURES WITH DIASTOLIC DYSFUNCTION   NORMAL RIGHT  VENTRICULAR SYSTOLIC FUNCTION   VALVULAR REGURGITATION: MILD MR, MILD TR   NO VALVULAR STENOSIS   POST CARDIOVERSION ON Jul 23, 2023.   EF NOW APPROX 35%   LEFT TO RIGHT SMALL ATRIAL SHUNT IS CONSISTENT W. PRIOR ECHO     Compared with prior Echo study on 05/31/2021: LEFT VENTRICLE SYSTOLIC   FUNCTION HAS DECREASED TO 35-40% EF.  PVI with Dr. Ubaldo Mace (05/27/2019) - Summary 1. Successful PVI and roof line  ASSESSMENT AND PLAN   Paroxysmal atrial fibrillation status post ablation with known recurrence Continue Toprol  XL 12.5 mg daily Continue Apixiban 5mg  BID Spoke with Dr. Mace- should the patient have recurrence, it is reasonable to consider possible re-do ablation.  May be a candidate for Dofetilide should we need to consider AAD Consider Kardia mobile for atrial fibrillation monitoring  2. LBBB        1. Will order stress testing. Last completed in 2018 with known LBBB.    She is agreeable with this plan moving forward. All of her questions were answered to her stated satisfaction. We will plan for follow-up in 1 year, sooner if needed.     Orders Placed This Encounter  Procedures  . Echo stress test    DISPOSITION   No follow-ups on file.  I spent a total of 30-39 minutes in both face-to-face and non-face-to-face activities for this visit on the date of this encounter.    Attestation Statement:   I personally performed the service, non-incident to. (WP)   BRIANA NAT KEEPERS, NP

## 2024-04-21 ENCOUNTER — Ambulatory Visit

## 2024-04-25 ENCOUNTER — Inpatient Hospital Stay: Admission: RE | Admit: 2024-04-25 | Source: Ambulatory Visit

## 2024-05-12 ENCOUNTER — Ambulatory Visit
Admission: RE | Admit: 2024-05-12 | Discharge: 2024-05-12 | Disposition: A | Discharge status: Home / Self Care | Source: Ambulatory Visit | Attending: Family Medicine | Admitting: Family Medicine

## 2024-05-12 DIAGNOSIS — Z1231 Encounter for screening mammogram for malignant neoplasm of breast: Secondary | ICD-10-CM | POA: Insufficient documentation

## 2024-05-25 DIAGNOSIS — M79672 Pain in left foot: Secondary | ICD-10-CM | POA: Diagnosis not present

## 2024-05-25 DIAGNOSIS — L03116 Cellulitis of left lower limb: Secondary | ICD-10-CM | POA: Diagnosis not present

## 2024-05-25 NOTE — Progress Notes (Signed)
 [facility] - [facility]  Chief complaint: Foot Injury (Left X 3-4 Days)   Subjective: Teresa [name] is a 83 y.o. female here for acute visit. History of Present Illness Teresa [name] is an 83 year old female who presents with left foot pain after dropping a cell phone on it.  Three days ago, she dropped her cell phone on her left foot, resulting in localized pain and swelling at the site of impact. The pain does not radiate, and she does not experience discomfort while walking. She denies any significant pain that would necessitate medication and is managing without pain relief. Swelling is present at the site of impact.  She also reports experiencing chest pain following a recent mammogram. The pain is localized to the chest /breast area. No palpable abnormalities were noted during the breast exam.   Current Outpatient Medications: .  acetaminophen  (TYLENOL ) 500 MG tablet, Take 2 tablets (1,000 mg total) by mouth every 8 (eight) hours for 360 days, Disp: 540 tablet, Rfl: 3.  apixaban  (ELIQUIS ) 5 mg tablet, Take 1 tablet (5 mg total) by mouth 2 (two) times daily, Disp: 180 tablet, Rfl: 3.  apremilast  (OTEZLA ) 30 mg tablet, Take 1 tablet (30 mg total) by mouth 2 (two) times daily, Disp: 180 tablet, Rfl: 1.  cetirizine  (ZYRTEC ) 10 MG tablet, Take 10 mg by mouth once daily, Disp: , Rfl: .  cholecalciferol (VITAMIN D3) 1000 unit capsule, Take 1,000 Units by mouth once daily   , Disp: , Rfl: .  donepeziL  (ARICEPT ) 5 MG tablet, TAKE 1 TABLET BY MOUTH EVERYDAY AT BEDTIME, Disp: 90 tablet, Rfl: 1.  ENTRESTO  24-26 mg tablet, TAKE 1 TABLET BY MOUTH TWICE A DAY, Disp: 180 tablet, Rfl: 3.  fluticasone  (FLONASE ) 50 mcg/actuation nasal spray, Place 2 sprays into both nostrils once daily as needed  , Disp: , Rfl: 12.  gabapentin  (NEURONTIN ) 300 MG capsule, Do not take with other neuroleptics (Lyrica , cymbalta, amitriptyline, etc).1 capsule in the morning, 2 tablets in the evening, Disp: 270 capsule, Rfl: 1.   lidocaine  (LIDODERM ) 5 % patch, APPLY 1 PATCH TO THE MOST PAINFUL AREA ON THE SKIN DAILY FOR 30 DAYS FOR UP TO 12 HOURS PER 24 HRS, Disp: 30 patch, Rfl: 0.  memantine  (NAMENDA ) 10 MG tablet, take 1 tablet by mouth twice a day, Disp: 180 tablet, Rfl: 3.  metoprolol  succinate (TOPROL -XL) 25 MG XL tablet, Take 0.5 tablets (12.5 mg total) by mouth once daily Take only if heart rate is above 50 beats per minute, Disp: 180 tablet, Rfl: 0.  multivitamin capsule, Take 1 capsule by mouth once daily, Disp: , Rfl: .  rosuvastatin  (CRESTOR ) 5 MG tablet, Take 1 tablet (5 mg total) by mouth once daily, Disp: 90 tablet, Rfl: 3.  traMADoL  (ULTRAM ) 50 mg tablet, Take 1 tablet (50 mg total) by mouth every 12 (twelve) hours as needed for Pain, Disp: 180 tablet, Rfl: 1  ROS reviewed: General ROS:  Negative for  - fatigue, fevers, chills HEENT ROS: negative for seasonal allergies, congestion, cough Respiratory ROS: negative for - cough, SOB, wheezing Cardiovascular ROS: no chest pain or dyspnea on exertion Gastrointestinal ROS: negative for constipation, N/V,D Genito-Urinary ROS: no dysuria, trouble voiding, or hematuria Musculoskeletal ROS: negative for joint pain, swelling Neurological ROS: negative for headaches, dizzinesss Dermatological ROS: negative for rash or skin lesion    Psychological ROS: negative for depression or anxiety  Allergies  Allergen Reactions  . Codeine Other (See Comments)    ALTERED MENTAL STATUS  .  Iodinated Contrast Media Swelling  . Sulfa (Sulfonamide Antibiotics) Hives  . Amiodarone  Nausea And Vomiting    Social History   Tobacco Use  . Smoking status: Former    Current packs/day: 0.00    Average packs/day: 1 pack/day for 35.0 years (35.0 ttl pk-yrs)    Types: Cigarettes    Start date: 37    Quit date: 1990    Years since quitting: 35.4    Passive exposure: Past  . Smokeless tobacco: Never  Vaping Use  . Vaping status: Never Used  Substance Use Topics  . Alcohol  use:  Not Currently    Alcohol /week: 0.0 standard drinks of alcohol   . Drug use: No      Objective:  BP 138/70 (BP Location: Left upper arm, Patient Position: Sitting, BP Cuff Size: Adult)   Pulse 80   Ht 163.8 cm (5' 4.5)   Wt 71.7 kg (158 lb)   LMP  (LMP Unknown)   SpO2 97%   BMI 26.70 kg/m  reviewed. Gen: AAOx3. Well-developed and well-nourished. NAD.  HEENT:    HEAD NORMOCEPHALIC.  PERRLA, EOM intact.  No thyromegaly present. No lymphadpathy. Cardiovascular: Normal rate, regular rhythm. Normal S1 and S2 without murmus, rubs or gallops.  Pulmonary/Chest: CTAB. Effort normal and breath sounds normal. No respiratory distress. No wheezes or rales. Abdomen: Soft. Bowel sounds are normal. No distension or tenderness.  Neuro: Cranial nervess II-XII intact.Nonfocal exam. Skin: Skin is warm and dry.  Musculoskeletal: Normal range of motion. No edema, no tenderness.  Psychiatric: Normal mood and affect. Her behavior is normal. Judgment and thought content normal Breast--normal exam  Assessment and Plan: Assessment & Plan Left foot contusion with mild cellulitis Mild cellulitis from trauma-induced skin breakage. Swelling due to impact. Minimal pain, no significant walking difficulty. - Order x-ray of left foot to rule out fracture. - Prescribe weak antibiotic for mild cellulitis. - Advise ice application for pain and swelling. - Recommend foot elevation during the day. - Suggest heating pad use as condition improves.  Chest pain Localized chest pain likely due to chest wall discomfort post-mammogram. No breast abnormalities detected. - Advise heating pad on chest for pain relief.  Prediabetes Prediabetic with borderline high blood glucose levels.  Left foot pain  (primary encounter diagnosis) Plan: X-ray foot left 3 plus views  Cellulitis of left lower extremity  Neuropathy      This note has been created using automated tools and reviewed for accuracy by RICHARD LESLIE  GILBERT.

## 2024-06-13 DIAGNOSIS — I110 Hypertensive heart disease with heart failure: Secondary | ICD-10-CM | POA: Diagnosis not present

## 2024-06-13 DIAGNOSIS — I509 Heart failure, unspecified: Secondary | ICD-10-CM | POA: Diagnosis not present

## 2024-06-13 DIAGNOSIS — I272 Pulmonary hypertension, unspecified: Secondary | ICD-10-CM | POA: Diagnosis not present

## 2024-06-13 DIAGNOSIS — K219 Gastro-esophageal reflux disease without esophagitis: Secondary | ICD-10-CM | POA: Diagnosis not present

## 2024-06-13 DIAGNOSIS — M1990 Unspecified osteoarthritis, unspecified site: Secondary | ICD-10-CM | POA: Diagnosis not present

## 2024-06-13 DIAGNOSIS — Z8249 Family history of ischemic heart disease and other diseases of the circulatory system: Secondary | ICD-10-CM | POA: Diagnosis not present

## 2024-06-13 DIAGNOSIS — G309 Alzheimer's disease, unspecified: Secondary | ICD-10-CM | POA: Diagnosis not present

## 2024-06-13 DIAGNOSIS — E785 Hyperlipidemia, unspecified: Secondary | ICD-10-CM | POA: Diagnosis not present

## 2024-06-13 DIAGNOSIS — I4891 Unspecified atrial fibrillation: Secondary | ICD-10-CM | POA: Diagnosis not present

## 2024-06-13 DIAGNOSIS — Z008 Encounter for other general examination: Secondary | ICD-10-CM | POA: Diagnosis not present

## 2024-06-13 DIAGNOSIS — D6869 Other thrombophilia: Secondary | ICD-10-CM | POA: Diagnosis not present

## 2024-06-13 DIAGNOSIS — J4489 Other specified chronic obstructive pulmonary disease: Secondary | ICD-10-CM | POA: Diagnosis not present

## 2024-06-13 DIAGNOSIS — L4050 Arthropathic psoriasis, unspecified: Secondary | ICD-10-CM | POA: Diagnosis not present

## 2024-06-26 ENCOUNTER — Other Ambulatory Visit: Payer: Self-pay

## 2024-06-26 ENCOUNTER — Inpatient Hospital Stay
Admission: EM | Admit: 2024-06-26 | Discharge: 2024-06-30 | DRG: 309 | Disposition: A | Discharge status: Home Health | Attending: Internal Medicine | Admitting: Internal Medicine

## 2024-06-26 ENCOUNTER — Emergency Department

## 2024-06-26 DIAGNOSIS — Z951 Presence of aortocoronary bypass graft: Secondary | ICD-10-CM

## 2024-06-26 DIAGNOSIS — I255 Ischemic cardiomyopathy: Secondary | ICD-10-CM | POA: Diagnosis present

## 2024-06-26 DIAGNOSIS — Z801 Family history of malignant neoplasm of trachea, bronchus and lung: Secondary | ICD-10-CM

## 2024-06-26 DIAGNOSIS — G309 Alzheimer's disease, unspecified: Secondary | ICD-10-CM | POA: Diagnosis present

## 2024-06-26 DIAGNOSIS — I4891 Unspecified atrial fibrillation: Secondary | ICD-10-CM | POA: Diagnosis not present

## 2024-06-26 DIAGNOSIS — Z981 Arthrodesis status: Secondary | ICD-10-CM

## 2024-06-26 DIAGNOSIS — L4050 Arthropathic psoriasis, unspecified: Secondary | ICD-10-CM | POA: Diagnosis present

## 2024-06-26 DIAGNOSIS — F02A0 Dementia in other diseases classified elsewhere, mild, without behavioral disturbance, psychotic disturbance, mood disturbance, and anxiety: Secondary | ICD-10-CM | POA: Diagnosis present

## 2024-06-26 DIAGNOSIS — J811 Chronic pulmonary edema: Secondary | ICD-10-CM | POA: Diagnosis not present

## 2024-06-26 DIAGNOSIS — Z853 Personal history of malignant neoplasm of breast: Secondary | ICD-10-CM

## 2024-06-26 DIAGNOSIS — R0689 Other abnormalities of breathing: Secondary | ICD-10-CM | POA: Diagnosis not present

## 2024-06-26 DIAGNOSIS — Z87891 Personal history of nicotine dependence: Secondary | ICD-10-CM

## 2024-06-26 DIAGNOSIS — I2510 Atherosclerotic heart disease of native coronary artery without angina pectoris: Secondary | ICD-10-CM | POA: Diagnosis present

## 2024-06-26 DIAGNOSIS — Z79899 Other long term (current) drug therapy: Secondary | ICD-10-CM

## 2024-06-26 DIAGNOSIS — M069 Rheumatoid arthritis, unspecified: Secondary | ICD-10-CM | POA: Diagnosis present

## 2024-06-26 DIAGNOSIS — Z96643 Presence of artificial hip joint, bilateral: Secondary | ICD-10-CM | POA: Diagnosis present

## 2024-06-26 DIAGNOSIS — I110 Hypertensive heart disease with heart failure: Secondary | ICD-10-CM | POA: Diagnosis present

## 2024-06-26 DIAGNOSIS — J4489 Other specified chronic obstructive pulmonary disease: Secondary | ICD-10-CM | POA: Diagnosis present

## 2024-06-26 DIAGNOSIS — Z8261 Family history of arthritis: Secondary | ICD-10-CM

## 2024-06-26 DIAGNOSIS — Z743 Need for continuous supervision: Secondary | ICD-10-CM | POA: Diagnosis not present

## 2024-06-26 DIAGNOSIS — K219 Gastro-esophageal reflux disease without esophagitis: Secondary | ICD-10-CM | POA: Diagnosis present

## 2024-06-26 DIAGNOSIS — Z8052 Family history of malignant neoplasm of bladder: Secondary | ICD-10-CM

## 2024-06-26 DIAGNOSIS — Z555 Less than a high school diploma: Secondary | ICD-10-CM

## 2024-06-26 DIAGNOSIS — Z7951 Long term (current) use of inhaled steroids: Secondary | ICD-10-CM

## 2024-06-26 DIAGNOSIS — Z91041 Radiographic dye allergy status: Secondary | ICD-10-CM

## 2024-06-26 DIAGNOSIS — Z923 Personal history of irradiation: Secondary | ICD-10-CM

## 2024-06-26 DIAGNOSIS — Z888 Allergy status to other drugs, medicaments and biological substances status: Secondary | ICD-10-CM

## 2024-06-26 DIAGNOSIS — I2489 Other forms of acute ischemic heart disease: Secondary | ICD-10-CM | POA: Diagnosis present

## 2024-06-26 DIAGNOSIS — E785 Hyperlipidemia, unspecified: Secondary | ICD-10-CM | POA: Diagnosis present

## 2024-06-26 DIAGNOSIS — I447 Left bundle-branch block, unspecified: Secondary | ICD-10-CM | POA: Diagnosis present

## 2024-06-26 DIAGNOSIS — Z8249 Family history of ischemic heart disease and other diseases of the circulatory system: Secondary | ICD-10-CM

## 2024-06-26 DIAGNOSIS — I700 Atherosclerosis of aorta: Secondary | ICD-10-CM | POA: Diagnosis not present

## 2024-06-26 DIAGNOSIS — I252 Old myocardial infarction: Secondary | ICD-10-CM

## 2024-06-26 DIAGNOSIS — Z885 Allergy status to narcotic agent status: Secondary | ICD-10-CM

## 2024-06-26 DIAGNOSIS — I5042 Chronic combined systolic (congestive) and diastolic (congestive) heart failure: Secondary | ICD-10-CM | POA: Diagnosis present

## 2024-06-26 DIAGNOSIS — Z882 Allergy status to sulfonamides status: Secondary | ICD-10-CM

## 2024-06-26 DIAGNOSIS — I4819 Other persistent atrial fibrillation: Secondary | ICD-10-CM | POA: Diagnosis not present

## 2024-06-26 DIAGNOSIS — Z803 Family history of malignant neoplasm of breast: Secondary | ICD-10-CM

## 2024-06-26 DIAGNOSIS — Z841 Family history of disorders of kidney and ureter: Secondary | ICD-10-CM

## 2024-06-26 DIAGNOSIS — I428 Other cardiomyopathies: Secondary | ICD-10-CM | POA: Diagnosis present

## 2024-06-26 DIAGNOSIS — Z7901 Long term (current) use of anticoagulants: Secondary | ICD-10-CM

## 2024-06-26 DIAGNOSIS — I499 Cardiac arrhythmia, unspecified: Secondary | ICD-10-CM | POA: Diagnosis not present

## 2024-06-26 DIAGNOSIS — Z96652 Presence of left artificial knee joint: Secondary | ICD-10-CM | POA: Diagnosis present

## 2024-06-26 DIAGNOSIS — I272 Pulmonary hypertension, unspecified: Secondary | ICD-10-CM | POA: Diagnosis present

## 2024-06-26 DIAGNOSIS — R079 Chest pain, unspecified: Secondary | ICD-10-CM | POA: Diagnosis not present

## 2024-06-26 LAB — TROPONIN I (HIGH SENSITIVITY)
Troponin I (High Sensitivity): 30 ng/L — ABNORMAL HIGH (ref <18)
Troponin I (High Sensitivity): 35 ng/L — ABNORMAL HIGH (ref <18)

## 2024-06-26 LAB — BASIC METABOLIC PANEL WITH GFR
Anion gap: 9 (ref 5–15)
BUN: 16 mg/dL (ref 8–23)
CO2: 23 mmol/L (ref 22–32)
Calcium: 9.6 mg/dL (ref 8.9–10.3)
Chloride: 109 mmol/L (ref 98–111)
Creatinine, Ser: 0.89 mg/dL (ref 0.44–1.00)
GFR, Estimated: >60 mL/min (ref >60)
Glucose, Bld: 101 mg/dL — ABNORMAL HIGH (ref 70–99)
Potassium: 4.1 mmol/L (ref 3.5–5.1)
Sodium: 141 mmol/L (ref 135–145)

## 2024-06-26 LAB — CBC
HCT: 46.2 % — ABNORMAL HIGH (ref 36.0–46.0)
Hemoglobin: 14.9 g/dL (ref 12.0–15.0)
MCH: 30.5 pg (ref 26.0–34.0)
MCHC: 32.3 g/dL (ref 30.0–36.0)
MCV: 94.7 fL (ref 80.0–100.0)
Platelets: 208 K/uL (ref 150–400)
RBC: 4.88 MIL/uL (ref 3.87–5.11)
RDW: 13.2 % (ref 11.5–15.5)
WBC: 5.7 K/uL (ref 4.0–10.5)
nRBC: 0 % (ref 0.0–0.2)

## 2024-06-26 LAB — MAGNESIUM: Magnesium: 2.1 mg/dL (ref 1.7–2.4)

## 2024-06-26 LAB — TSH: TSH: 1.463 u[IU]/mL (ref 0.350–4.500)

## 2024-06-26 MED ORDER — MEMANTINE HCL 10 MG PO TABS
10.0000 mg | ORAL_TABLET | Freq: Every day | ORAL | Status: DC
  Administered 2024-06-27 – 2024-06-30 (×4): 10 mg via ORAL
  Filled 2024-06-26: qty 1
  Filled 2024-06-26: qty 2
  Filled 2024-06-26 (×2): qty 1

## 2024-06-26 MED ORDER — METOPROLOL SUCCINATE ER 25 MG PO TB24
25.0000 mg | ORAL_TABLET | Freq: Every day | ORAL | Status: DC
  Administered 2024-06-26 – 2024-06-27 (×2): 25 mg via ORAL
  Filled 2024-06-26 (×2): qty 1

## 2024-06-26 MED ORDER — ACETAMINOPHEN 650 MG RE SUPP: 650.0000 mg | Freq: Four times a day (QID) | RECTAL | Status: DC | PRN

## 2024-06-26 MED ORDER — DONEPEZIL HCL 5 MG PO TABS
5.0000 mg | ORAL_TABLET | Freq: Every day | ORAL | Status: DC
  Administered 2024-06-26 – 2024-06-29 (×4): 5 mg via ORAL
  Filled 2024-06-26 (×4): qty 1

## 2024-06-26 MED ORDER — FLUTICASONE FUROATE-VILANTEROL 200-25 MCG/ACT IN AEPB: 1.0000 | INHALATION_SPRAY | Freq: Every day | RESPIRATORY_TRACT | Status: DC

## 2024-06-26 MED ORDER — VITAMIN B-12 1000 MCG PO TABS
1000.0000 ug | ORAL_TABLET | Freq: Every day | ORAL | Status: DC
  Administered 2024-06-26 – 2024-06-30 (×5): 1000 ug via ORAL
  Filled 2024-06-26: qty 1
  Filled 2024-06-26: qty 2
  Filled 2024-06-26 (×2): qty 1
  Filled 2024-06-26: qty 2

## 2024-06-26 MED ORDER — DILTIAZEM HCL-DEXTROSE 125-5 MG/125ML-% IV SOLN (PREMIX)
5.0000 mg/h | INTRAVENOUS | Status: DC
  Administered 2024-06-26: 5 mg/h via INTRAVENOUS
  Filled 2024-06-26: qty 125

## 2024-06-26 MED ORDER — MELATONIN 5 MG PO TABS
5.0000 mg | ORAL_TABLET | Freq: Every evening | ORAL | Status: DC | PRN
  Administered 2024-06-26 – 2024-06-29 (×3): 5 mg via ORAL
  Filled 2024-06-26 (×3): qty 1

## 2024-06-26 MED ORDER — ALPRAZOLAM 0.25 MG PO TABS
0.2500 mg | ORAL_TABLET | Freq: Once | ORAL | Status: AC
  Administered 2024-06-26: 0.25 mg via ORAL
  Filled 2024-06-26: qty 1

## 2024-06-26 MED ORDER — FLUTICASONE PROPIONATE 50 MCG/ACT NA SUSP: 2.0000 | Freq: Every day | NASAL | Status: DC | PRN

## 2024-06-26 MED ORDER — APIXABAN 5 MG PO TABS
5.0000 mg | ORAL_TABLET | Freq: Two times a day (BID) | ORAL | Status: DC
  Administered 2024-06-26 – 2024-06-30 (×8): 5 mg via ORAL
  Filled 2024-06-26 (×8): qty 1

## 2024-06-26 MED ORDER — ACETAMINOPHEN 325 MG PO TABS
650.0000 mg | ORAL_TABLET | Freq: Four times a day (QID) | ORAL | Status: DC | PRN
  Administered 2024-06-29 (×2): 650 mg via ORAL
  Filled 2024-06-26 (×2): qty 2

## 2024-06-26 MED ORDER — ROSUVASTATIN CALCIUM 5 MG PO TABS
5.0000 mg | ORAL_TABLET | Freq: Every day | ORAL | Status: DC
  Administered 2024-06-27 – 2024-06-30 (×4): 5 mg via ORAL
  Filled 2024-06-26 (×4): qty 1

## 2024-06-26 NOTE — ED Notes (Signed)
 Dilt drip received at this time.

## 2024-06-26 NOTE — ED Triage Notes (Signed)
 Pt in via [facility] from home for Afib RVR. Vitals WNL except for HR that is ranging from 50-150bpm.

## 2024-06-26 NOTE — ED Provider Notes (Signed)
 [facility] Provider Note    Event Date/Time   First MD Initiated Contact with Patient 06/26/24 1517     (approximate)   History   No chief complaint on file.   HPI  Teresa [name] is a 83 y.o. female with a history of CAD, atrial fibrillation on Eliquis , hyperlipidemia, and GERD who presents with concern for atrial fibrillation over the last 2-3 days.  The patient states that she has been compliant with her medications.  She reports feeling palpitations described as a sensation of her heart racing.  She has associated shortness of breath and lightheadedness.  She has some chest discomfort as well.  She denies any leg swelling.  I reviewed the past medical records.  The patient's most recent outpatient count was on 6/4 with family medicine for foot pain after a minor injury.   Physical Exam   Triage Vital Signs: ED Triage Vitals  Encounter Vitals Group     BP 06/26/24 1419 (!) 144/100     Girls Systolic BP Percentile --      Girls Diastolic BP Percentile --      Boys Systolic BP Percentile --      Boys Diastolic BP Percentile --      Pulse Rate 06/26/24 1419 (!) 131     Resp 06/26/24 1419 12     Temp 06/26/24 1419 97.9 F (36.6 C)     Temp Source 06/26/24 1419 Oral     SpO2 06/26/24 1419 99 %     Weight 06/26/24 1415 154 lb 11.2 oz (70.2 kg)     Height 06/26/24 1415 5' 4 (1.626 m)     Head Circumference --      Peak Flow --      Pain Score 06/26/24 1419 0     Pain Loc --      Pain Education --      Exclude from Growth Chart --     Most recent vital signs: Vitals:   06/26/24 1800 06/26/24 1836  BP: 115/65   Pulse: 94   Resp: 15   Temp:  97.7 F (36.5 C)  SpO2: 100%      General: Awake, no distress.  CV:  Good peripheral perfusion.  Tachycardic, regular rhythm. Resp:  Normal effort.  Lungs CTAB Abd:  No distention.  Other:  No peripheral edema.   ED Results / Procedures / Treatments   Labs (all labs ordered are listed, but  only abnormal results are displayed) Labs Reviewed  BASIC METABOLIC PANEL WITH GFR - Abnormal; Notable for the following components:      Result Value   Glucose, Bld 101 (*)    All other components within normal limits  CBC - Abnormal; Notable for the following components:   HCT 46.2 (*)    All other components within normal limits  TROPONIN I (HIGH SENSITIVITY) - Abnormal; Notable for the following components:   Troponin I (High Sensitivity) 30 (*)    All other components within normal limits  TROPONIN I (HIGH SENSITIVITY) - Abnormal; Notable for the following components:   Troponin I (High Sensitivity) 35 (*)    All other components within normal limits  TSH  BASIC METABOLIC PANEL WITH GFR  CBC  MAGNESIUM      EKG  ED ECG REPORT I, Waylon Cassis, the attending physician, personally viewed and interpreted this ECG.  Date: 06/26/2024 EKG Time: 1419 Rate: 133 Rhythm: Atrial flutter QRS Axis: normal Intervals: normal ST/T Wave abnormalities:  Nonspecific ST abnormalities Narrative Interpretation: no evidence of acute ischemia    RADIOLOGY  Chest x-ray: I independently viewed and interpreted the images; there is mild edema with no focal consolidation   PROCEDURES:  Critical Care performed: No  Procedures   MEDICATIONS ORDERED IN ED: Medications  diltiazem  (CARDIZEM ) 125 mg in dextrose  5% 125 mL (1 mg/mL) infusion (10 mg/hr Intravenous Rate/Dose Change 06/26/24 1711)  fluticasone  furoate-vilanterol (BREO ELLIPTA ) 200-25 MCG/ACT 1 puff (has no administration in time range)  fluticasone  (FLONASE ) 50 MCG/ACT nasal spray 2 spray (has no administration in time range)  rosuvastatin  (CRESTOR ) tablet 5 mg (has no administration in time range)  donepezil  (ARICEPT ) tablet 5 mg (has no administration in time range)  memantine  (NAMENDA ) tablet 10 mg (has no administration in time range)  apixaban  (ELIQUIS ) tablet 5 mg (has no administration in time range)  cyanocobalamin   (VITAMIN B12) tablet 1,000 mcg (has no administration in time range)  acetaminophen  (TYLENOL ) tablet 650 mg (has no administration in time range)    Or  acetaminophen  (TYLENOL ) suppository 650 mg (has no administration in time range)  ALPRAZolam  (XANAX ) tablet 0.25 mg (0.25 mg Oral Given 06/26/24 1711)     IMPRESSION / MDM / ASSESSMENT AND PLAN / ED COURSE  I reviewed the triage vital signs and the nursing notes.  83 year old female with PMH as noted above presents with palpitations for the last couple of days associated with shortness of breath, lightheadedness, chest discomfort, similar to prior episodes of atrial fibrillation.  On exam the patient's heart rate is in the 140s consistent atrial fibrillation/flutter.  EKG shows the same.  Differential diagnosis includes, but is not limited to, atrial fibrillation/flutter with RVR, other tachydysrhythmia.  The patient does not appear to be in acute CHF.  Will obtain basic labs, cardiac enzymes, chest x-ray, start her on a Cardizem  infusion, and reassess.  Patient's presentation is most consistent with acute presentation with potential threat to life or bodily function.  The patient is on the cardiac monitor to evaluate for evidence of arrhythmia and/or significant heart rate changes.  ----------------------------------------- 6:47 PM on 06/26/2024 -----------------------------------------  Heart rate is improved to the 90s with the Cardizem  infusion.  Lab workup is unremarkable.  Troponin is minimally elevated.  CBC and BMP show no acute findings.  Chest x-ray shows mild edema.  The patient states she is feeling somewhat better.  She will need admission for further management.  I consulted Dr. Sherlon from the hospitalist service; based on our discussion he agreed to evaluate the patient for admission.   FINAL CLINICAL IMPRESSION(S) / ED DIAGNOSES   Final diagnoses:  Atrial fibrillation with RVR (HCC)     Rx / DC Orders   ED  Discharge Orders     None        Note:  This document was prepared using Dragon voice recognition software and may include unintentional dictation errors.    Jacolyn Pae, MD 06/26/24 [phone]

## 2024-06-26 NOTE — ED Notes (Signed)
 Repeat trop sent

## 2024-06-26 NOTE — H&P (Signed)
 [facility] H&P   Patient Demographics:    Teresa [name], is a 83 y.o. female  MRN: [id number]   DOB - 1941-03-31  Admit Date - 06/26/2024  Outpatient Primary MD for the patient is Bertrum Charlie CROME, MD  Referring MD/NP/PA: Dr. Jacolyn  Outpatient Specialists: [facility]  Patient coming from: Home  No chief complaint on file.     HPI:    Teresa [name]  is a 83 y.o. female,with ICM, LVEF (35-40%), CAD s/p CABG 1994 with LIMA-LAD, HTN, Hyperlipidemia, AF s/p PVI.  - Presents to ED secondary to complaints of palpitation, weakness and fatigue over the last 2 to 3 days, patient reports some history of heart racing/palpitation, she does report some shortness of breath, lightheadedness, and some chest discomfort, reports compliance with medication, but she did not take any of her meds today but otherwise she is strictly compliant with all meds, denies any orthopnea, worsening lower extremity edema. - In ED patient was noted to have heart rate 148 on presentation, A-fib with RVR, she was started on Cardizem  drip, her workup significant for elevated at bedtime troponins 30> 35, [facility] consulted to admit.    Review of systems:      A full 10 point Review of Systems was done, except as stated above, all other Review of Systems were negative.   With Past History of the following :    Past Medical History:  Diagnosis Date   Arthritis    Asthma    Back pain    Breast cancer, left (HCC) 2009   CAD (coronary artery disease)    a. 1994 s/p CABG x 1 (LIMA->LAD); b. 03/2015 MV: No ischemia; c. MV 11/18: small fixed apical defect likely secondary to breast attenuation, EF of 42%, frequent PVCs; d. 12/2017 Cath: LM nl, LAD 20p, D1/2/3 nl, LCX min irregs, OM1/2/3 min irregs, RCA nl, RPDA nl, RPL1/2 nl, LIMA->LAD atretic.   Chronic anticoagulation    Apixaban    Chronic combined  systolic (congestive) and diastolic (congestive) heart failure (HCC)    a. 2013 EF 40%;  b. 03/2015 Echo: EF 55-60%; c. 12/18 Echo: EF of 35-40%; d. 12/2017 TEE: EF 35-40%; e. 03/2018 Echo: EF 40-45%; f. 09/2018 Echo: EF 35%.   DDD (degenerative disc disease), lumbar    Dental crowns present    caps- left back top, right back bottom   Gastroesophageal reflux disease    Hyperlipidemia    Hypertension    LBBB (left bundle branch block)    MI (myocardial infarction) (HCC) 1994   Mixed Ischemic & Nonischemic cardiomyopathy    a. 2013 EF 40%;  b. 03/2015 Echo: EF 55-60%; c. 12/18 Echo: EF of 35-40%, hypokinesis of the anteroseptal, and apical myocardium, mild to mod MR, mod dil LA, nl RV fxn, PASP 53 mmHg; d. 12/2017 TEE: EF 35-40%, diff HK, mild to mod MR. small PFO. No LAA/RAA  thrombus; e. 03/2018 Echo: EF 40-45%, antsept/inf HK, Gr2 DD, mild MR, mod idl LA, mild to mod TR, PASP 35-6mmHg; f. 09/2018 Echo: EF 35%.   Osteoarthritis    left hip and knee   Persistent atrial fibrillation (HCC)    a. diagnosed 01/13/2018; b. CHADS2VASc = 6 --> Eliquis ; c. 12/2017 s/p TEE/DCCV. Amio started but d/c'd 01/2018 2/2 n/anorexia; d. 10/2018 DCCV-->recurrent Afib w/in days; e. 10/2018 DCCV x 2 in ED->persistent Afib.   Personal history of radiation therapy    PONV (postoperative nausea and vomiting)    Psoriasis    PSVT (paroxysmal supraventricular tachycardia) (HCC)    a. 02/2015 Holter: short runs of SVT and NSVT.   Pulmonary hypertension (HCC)    PVC's (premature ventricular contractions)    a. 03/2018 24h Holter: Freq PVC's with a total of 421 beats in 24 hrs. 7 short runs of SVT likely representing Afib.   Rheumatoid arthritis (HCC)    feet, hands   Vertigo    approx 2x/yr      Past Surgical History:  Procedure Laterality Date   ABDOMINAL HYSTERECTOMY     APPENDECTOMY     BREAST BIOPSY Right 1991   negative   BREAST EXCISIONAL BIOPSY Left 2009   positive   BREAST LUMPECTOMY Left 2009   CARDIAC  CATHETERIZATION N/A 05/27/1988   Location: [facility]; Surgeon: Miquel Ellen, MD   CARDIOVERSION N/A 01/15/2018   Procedure: CARDIOVERSION;  Surgeon: Darron Deatrice LABOR, MD;  Location: [facility];  Service: Cardiovascular;  Laterality: N/A;   CARDIOVERSION N/A 10/29/2018   Procedure: CARDIOVERSION;  Surgeon: Perla Evalene PARAS, MD;  Location: [facility];  Service: Cardiovascular;  Laterality: N/A;   CARDIOVERSION N/A 11/11/2018   DCCV x 2 attempts at 120 J; Location: [facility] ED   CERVICAL FUSION  2019   CHOLECYSTECTOMY     COLONOSCOPY     CORONARY ARTERY BYPASS GRAFT  04/11/1993   1v; LIMA-LAD; Location: [facility]; Surgeon: Lynwood Berg, MD   FOOT ARTHRODESIS Right 07/24/2016   Procedure: FUSION FIRST METATARSAL CUNEIFORM JOINT RIGHT FOOT, FUSION SECOND METATARSAL CUNEFORM JOINT BUNION REPAIR RIGHT FOOT;  Surgeon: Donnice Cory, DPM;  Location: [facility];  Service: Podiatry;  Laterality: Right;   HARDWARE REMOVAL Right 07/24/2016   Procedure: REMOVAL HARDWARE LATERAL MALLEOUS RIGHT ANKLE;  Surgeon: Donnice Cory, DPM;  Location: [facility];  Service: Podiatry;  Laterality: Right;  REMOVAL OF PIN WHICH WAS INTACT   HERNIA REPAIR     ventral   KNEE ARTHROSCOPY Left    LUMBAR FUSION     Pulmonary vein isolation (cardiac ablation) N/A 05/27/2019   Location: [facility]; Surgeon: Lamar Kerns, MD   RIGHT/LEFT HEART CATH AND CORONARY/GRAFT ANGIOGRAPHY N/A 01/14/2018   Procedure: LEFT HEART CATH AND CORONARY ANGIOGRAPHY;  Surgeon: Darron Deatrice LABOR, MD;  Location: [facility];  Service: Cardiovascular;  Laterality: N/A;   TEE WITHOUT CARDIOVERSION N/A 01/15/2018   Procedure: TRANSESOPHAGEAL ECHOCARDIOGRAM (TEE);  Surgeon: Darron Deatrice LABOR, MD;  Location: [facility];  Service: Cardiovascular;  Laterality: N/A;   TONSILLECTOMY     TOTAL HIP ARTHROPLASTY Left    TOTAL HIP ARTHROPLASTY Right 07/11/2021   Procedure: TOTAL HIP ARTHROPLASTY ANTERIOR APPROACH;  Surgeon: Kathlynn Sharper, MD;   Location: [facility];  Service: Orthopedics;  Laterality: Right;   TOTAL KNEE ARTHROPLASTY Left 12/19/2013   TUBAL LIGATION        Social History:     Social History   Tobacco Use   Smoking status: Former  Current packs/day: 0.00    Average packs/day: 0.5 packs/day for 35.0 years (17.5 ttl pk-yrs)    Types: Cigarettes    Start date: 32    Quit date: 66    Years since quitting: 34.5   Smokeless tobacco: Never   Tobacco comments:    Quit approx 1990  Substance Use Topics   Alcohol  use: No        Family History :     Family History  Problem Relation Age of Onset   Heart attack Father    Congestive Heart Failure Mother    Breast cancer Mother 50   Kidney failure Sister    Prostate cancer Brother    Bladder Cancer Brother    Lung cancer Sister    Arthritis Sister    Breast cancer Sister    Arthritis Sister    Breast cancer Sister    Lung cancer Sister    Lung cancer Brother    Arthritis Brother       Home Medications:   Prior to Admission medications   Medication Sig Start Date End Date Taking? Authorizing Provider  acetaminophen  (TYLENOL ) 650 MG CR tablet Take 650 mg by mouth every 8 (eight) hours as needed for pain. 01/03/20   [provider]  apixaban  (ELIQUIS ) 5 MG TABS tablet Take 1 tablet (5 mg total) by mouth 2 (two) times daily. 11/16/18   Darron Deatrice LABOR, MD  budesonide -formoterol  (SYMBICORT ) 160-4.5 MCG/ACT inhaler Inhale 2 puffs into the lungs 2 (two) times daily. 10/06/16   Bertrum Charlie CROME, MD  cetirizine  (ZYRTEC ) 10 MG tablet TAKE 1 TABLET BY MOUTH EVERY DAY 12/25/21   Rumball, Alison M, DO  docusate sodium  (COLACE) 100 MG capsule Take 1 capsule (100 mg total) by mouth 2 (two) times daily. 07/14/21   Charlene Debby BROCKS, PA-C  donepezil  (ARICEPT ) 5 MG tablet TAKE 1 TABLET BY MOUTH EVERYDAY AT BEDTIME 10/13/22   Simmons-Robinson, Makiera, MD  fluticasone  (FLONASE ) 50 MCG/ACT nasal spray PLACE 2 SPRAYS INTO BOTH NOSTRILS DAILY AS NEEDED FOR  ALLERGIES. 01/24/22   Bertrum Charlie CROME, MD  furosemide  (LASIX ) 20 MG tablet TAKE 1 TABLET (20 MG TOTAL) BY MOUTH DAILY AS NEEDED (SHORTNESS OF BREATH). 12/06/18   Gollan, Timothy J, MD  gabapentin  (NEURONTIN ) 300 MG capsule 1 CAPSULE IN THE MORNING, 2 CAPSULES IN THE EVENING 02/10/23   Simmons-Robinson, Rockie, MD  memantine  (NAMENDA ) 10 MG tablet Take 10 mg by mouth in the morning and at bedtime. 01/22/22   [provider]  methocarbamol  (ROBAXIN ) 500 MG tablet Take 1 tablet (500 mg total) by mouth every 6 (six) hours as needed for muscle spasms. 07/14/21   Charlene Debby BROCKS, PA-C  metoprolol  succinate (TOPROL -XL) 50 MG 24 hr tablet Take by mouth. 05/22/21   [provider]  Multiple Vitamin (MULTIVITAMIN WITH MINERALS) TABS tablet Take 1 tablet by mouth daily.    [provider]  OTEZLA  30 MG TABS Take 30 mg by mouth in the morning and at bedtime.    [provider]  rosuvastatin  (CRESTOR ) 5 MG tablet TAKE 1 TABLET BY MOUTH EVERY DAY 09/15/22   Bertrum Charlie CROME, MD  sacubitril -valsartan  (ENTRESTO ) 24-26 MG Take 1 tablet by mouth 2 (two) times daily.    [provider]  traMADol  (ULTRAM ) 50 MG tablet Take 1 tablet (50 mg total) by mouth every 6 (six) hours as needed. 07/14/21   Charlene Debby BROCKS, PA-C  venlafaxine  (EFFEXOR ) 75 MG tablet TAKE 2 TABLETS (150 MG TOTAL)  BY MOUTH DAILY. 02/19/21   Bertrum Charlie CROME, MD  vitamin B-12 (CYANOCOBALAMIN ) 1000 MCG tablet Take 1,000 mcg by mouth daily.    [provider]     Allergies:     Allergies  Allergen Reactions   Sulfa Antibiotics Hives   Ivp Dye [Iodinated Contrast Media] Swelling   Amiodarone  Nausea And Vomiting and Cough   Codeine Other (See Comments)    ALTERED MENTAL STATUS     Physical Exam:   Vitals  Blood pressure (!) 123/45, pulse 92, temperature 97.7 F (36.5 C), temperature source Oral, resp. rate 15, height 5' 4 (1.626 m), weight 70.2 kg, SpO2 96%.   1. General Frail, elderly  female, laying in bed, no apparent distress  2.  She is oriented x 3, she is aware she is on [place], aware of the year and the month, but overall appears to be with some poor history slow to respond, but answering questions appropriately.    3. No F.N deficits, ALL C.Nerves Intact, Strength 5/5 all 4 extremities, Sensation intact all 4 extremities, Plantars down going.  4. Ears and Eyes appear Normal, Conjunctivae clear, PERRLA. Moist Oral Mucosa.  5. Supple Neck, No JVD, No cervical lymphadenopathy appriciated, No Carotid Bruits.  6. Symmetrical Chest wall movement, Good air movement bilaterally, CTAB.  7.  Irregular irregular, No Gallops, Rubs or Murmurs, No Parasternal Heave.  8. Positive Bowel Sounds, Abdomen Soft, No tenderness, No organomegaly appriciated,No rebound -guarding or rigidity.  9.  No Cyanosis, Normal Skin Turgor, No Skin Rash or Bruise.  10. Good muscle tone,  joints appear normal , no effusions, Normal ROM.    Data Review:    CBC Recent Labs  Lab 06/26/24 1424  WBC 5.7  HGB 14.9  HCT 46.2*  PLT 208  MCV 94.7  MCH 30.5  MCHC 32.3  RDW 13.2   ------------------------------------------------------------------------------------------------------------------  Chemistries  Recent Labs  Lab 06/26/24 1424  NA 141  K 4.1  CL 109  CO2 23  GLUCOSE 101*  BUN 16  CREATININE 0.89  CALCIUM  9.6   ------------------------------------------------------------------------------------------------------------------ estimated creatinine clearance is 46.9 mL/min (by C-G formula based on SCr of 0.89 mg/dL). ------------------------------------------------------------------------------------------------------------------ No results for input(s): TSH, T4TOTAL, T3FREE, THYROIDAB in the last 72 hours.  Invalid input(s): FREET3  Coagulation profile No results for input(s): INR, PROTIME in the last 168  hours. ------------------------------------------------------------------------------------------------------------------- No results for input(s): DDIMER in the last 72 hours. -------------------------------------------------------------------------------------------------------------------  Cardiac Enzymes No results for input(s): CKMB, TROPONINI, MYOGLOBIN in the last 168 hours.  Invalid input(s): CK ------------------------------------------------------------------------------------------------------------------ No results found for: BNP   ---------------------------------------------------------------------------------------------------------------  Urinalysis    Component Value Date/Time   COLORURINE YELLOW (A) 07/02/2021 1447   APPEARANCEUR CLEAR (A) 07/02/2021 1447   APPEARANCEUR Hazy (A) 02/24/2017 1432   LABSPEC 1.012 07/02/2021 1447   LABSPEC 1.006 12/06/2013 0954   PHURINE 5.0 07/02/2021 1447   GLUCOSEU NEGATIVE 07/02/2021 1447   GLUCOSEU Negative 12/06/2013 0954   HGBUR SMALL (A) 07/02/2021 1447   BILIRUBINUR NEGATIVE 07/02/2021 1447   BILIRUBINUR Negative 02/24/2017 1432   BILIRUBINUR Negative 12/06/2013 0954   KETONESUR NEGATIVE 07/02/2021 1447   PROTEINUR NEGATIVE 07/02/2021 1447   UROBILINOGEN 0.2 12/25/2016 1106   NITRITE NEGATIVE 07/02/2021 1447   LEUKOCYTESUR NEGATIVE 07/02/2021 1447   LEUKOCYTESUR Negative 12/06/2013 0954    ----------------------------------------------------------------------------------------------------------------   Imaging Results:    DG Chest Port 1 View Result Date: 06/26/2024 CLINICAL DATA:  [id number] A-fib (HCC) [id number] EXAM: PORTABLE CHEST 1 VIEW COMPARISON:  Chest x-ray 07/20/2023 FINDINGS: The heart and mediastinal contours are unchanged. Atherosclerotic plaque. No focal consolidation. Mild pulmonary edema. No pleural effusion. No pneumothorax. No acute osseous abnormality.  Sternotomy wires are intact. IMPRESSION:  1. Mild pulmonary edema. 2.  Aortic Atherosclerosis (ICD10-I70.0). Electronically Signed   By: Morgane  Naveau M.D.   On: 06/26/2024 16:09     EKG:  Vent. rate 133 BPM PR interval * ms QRS duration 170 ms QT/QTcB 376/560 ms P-R-T axes * 236 58 Atrial flutter with predominant 2:1 AV block Nonspecific intraventricular conduction delay Abnormal lateral Q waves Probable anteroseptal infarct, recent  Assessment & Plan:    Principal Problem:   Atrial fibrillation with RVR (HCC) Active Problems:   Rheumatoid arthritis (HCC)   S/P CABG (coronary artery bypass graft)   Psoriatic arthritis (HCC)    A-fib with RVR -Heart rate in the 140s upon presentation, she reports palpitation and fatigue, she does report compliance with medications, no clear why she went into with RVR. - She was started on Cardizem  drip, will continue, we will resume on Toprol -XL, will increase home dose from 12.5 mg to 25 mg.  Will try to wean off Cardizem  drip in the setting of her known low EF. - Check TSH -Will repeat 2D echo as last 1 in the system appears to be in 2024. - Placed routine cardiology consult to [facility] - Continue with Eliquis  for anticoagulation, reports compliance, but she missed her morning dose, so we will give her first dose now - Target potassium> 4, magnesium > 2  CAD status post CABG 1994 Ischemic cardiomyopathy with most recent EF 35% by echo in August 2024 -Does not appear to be in decompensated CHF currently. - Will hold Entresto  for now to allow more room to uptitrate her beta-blockers as well she is on Cardizem  drip - Will repeat 2D echo. - Hold on diuresis for now given no evidence of volume overload  Elevated troponins - Her troponins are borderline elevated, this is likely in setting of demand ischemia from her A-fib with RVR, continue with beta-blockers, Eliquis  and Crestor , will obtain 2D echo in a.m. Hypertension -please see above discussion, hold Entresto , increase  Toprol -XL  Hyperlipidemia - Continue with statin  COPD - No active wheezing, continue with home meds  Rheumatoid arthritis/Psoriatic arthritis  arthritis - Resume Otezla  when stable  History of dementia - Continue with Aricept  and Namenda      DVT Prophylaxis >> resume Eliquis   AM Labs Ordered, also please review Full Orders  Family Communication: Admission, patients condition and plan of care including tests being ordered have been discussed with the patient (I have tried to reach both numbers for son and daughter, no availability, left voice message for son) who indicate understanding and agree with the plan and Code Status.  Code Status full code as discussed with the patient  Likely DC to home  Consults called: Cardiology consult requested in epic  Admission status: Observation  Time spent in minutes : 70 minutes   Brayton Lye M.D on 06/26/2024 at 6:37 PM   [facility] - Office  [phone]

## 2024-06-27 ENCOUNTER — Observation Stay
Admit: 2024-06-27 | Discharge: 2024-06-27 | Disposition: A | Discharge status: Home / Self Care | Attending: Internal Medicine | Admitting: Internal Medicine

## 2024-06-27 ENCOUNTER — Telehealth (HOSPITAL_COMMUNITY): Payer: Self-pay | Admitting: Pharmacy Technician

## 2024-06-27 ENCOUNTER — Other Ambulatory Visit (HOSPITAL_COMMUNITY): Payer: Self-pay

## 2024-06-27 DIAGNOSIS — I2489 Other forms of acute ischemic heart disease: Secondary | ICD-10-CM | POA: Diagnosis not present

## 2024-06-27 DIAGNOSIS — I447 Left bundle-branch block, unspecified: Secondary | ICD-10-CM | POA: Diagnosis not present

## 2024-06-27 DIAGNOSIS — I10 Essential (primary) hypertension: Secondary | ICD-10-CM | POA: Diagnosis not present

## 2024-06-27 DIAGNOSIS — Z96652 Presence of left artificial knee joint: Secondary | ICD-10-CM | POA: Diagnosis not present

## 2024-06-27 DIAGNOSIS — I4819 Other persistent atrial fibrillation: Secondary | ICD-10-CM | POA: Diagnosis not present

## 2024-06-27 DIAGNOSIS — Z96643 Presence of artificial hip joint, bilateral: Secondary | ICD-10-CM | POA: Diagnosis not present

## 2024-06-27 DIAGNOSIS — Z79899 Other long term (current) drug therapy: Secondary | ICD-10-CM | POA: Diagnosis not present

## 2024-06-27 DIAGNOSIS — L4050 Arthropathic psoriasis, unspecified: Secondary | ICD-10-CM | POA: Diagnosis not present

## 2024-06-27 DIAGNOSIS — I2510 Atherosclerotic heart disease of native coronary artery without angina pectoris: Secondary | ICD-10-CM | POA: Diagnosis not present

## 2024-06-27 DIAGNOSIS — I272 Pulmonary hypertension, unspecified: Secondary | ICD-10-CM | POA: Diagnosis not present

## 2024-06-27 DIAGNOSIS — Z951 Presence of aortocoronary bypass graft: Secondary | ICD-10-CM | POA: Diagnosis not present

## 2024-06-27 DIAGNOSIS — I255 Ischemic cardiomyopathy: Secondary | ICD-10-CM | POA: Diagnosis not present

## 2024-06-27 DIAGNOSIS — I4891 Unspecified atrial fibrillation: Secondary | ICD-10-CM | POA: Diagnosis not present

## 2024-06-27 DIAGNOSIS — Z7951 Long term (current) use of inhaled steroids: Secondary | ICD-10-CM | POA: Diagnosis not present

## 2024-06-27 DIAGNOSIS — I5023 Acute on chronic systolic (congestive) heart failure: Secondary | ICD-10-CM | POA: Diagnosis not present

## 2024-06-27 DIAGNOSIS — Z8249 Family history of ischemic heart disease and other diseases of the circulatory system: Secondary | ICD-10-CM | POA: Diagnosis not present

## 2024-06-27 DIAGNOSIS — J4489 Other specified chronic obstructive pulmonary disease: Secondary | ICD-10-CM | POA: Diagnosis not present

## 2024-06-27 DIAGNOSIS — Z923 Personal history of irradiation: Secondary | ICD-10-CM | POA: Diagnosis not present

## 2024-06-27 DIAGNOSIS — G309 Alzheimer's disease, unspecified: Secondary | ICD-10-CM | POA: Diagnosis not present

## 2024-06-27 DIAGNOSIS — I428 Other cardiomyopathies: Secondary | ICD-10-CM | POA: Diagnosis not present

## 2024-06-27 DIAGNOSIS — F02A0 Dementia in other diseases classified elsewhere, mild, without behavioral disturbance, psychotic disturbance, mood disturbance, and anxiety: Secondary | ICD-10-CM | POA: Diagnosis not present

## 2024-06-27 DIAGNOSIS — Z7901 Long term (current) use of anticoagulants: Secondary | ICD-10-CM | POA: Diagnosis not present

## 2024-06-27 DIAGNOSIS — I110 Hypertensive heart disease with heart failure: Secondary | ICD-10-CM | POA: Diagnosis not present

## 2024-06-27 DIAGNOSIS — I480 Paroxysmal atrial fibrillation: Secondary | ICD-10-CM | POA: Diagnosis not present

## 2024-06-27 DIAGNOSIS — M069 Rheumatoid arthritis, unspecified: Secondary | ICD-10-CM | POA: Diagnosis not present

## 2024-06-27 DIAGNOSIS — I25810 Atherosclerosis of coronary artery bypass graft(s) without angina pectoris: Secondary | ICD-10-CM | POA: Diagnosis not present

## 2024-06-27 DIAGNOSIS — E785 Hyperlipidemia, unspecified: Secondary | ICD-10-CM | POA: Diagnosis not present

## 2024-06-27 DIAGNOSIS — I5042 Chronic combined systolic (congestive) and diastolic (congestive) heart failure: Secondary | ICD-10-CM | POA: Diagnosis not present

## 2024-06-27 DIAGNOSIS — Z87891 Personal history of nicotine dependence: Secondary | ICD-10-CM | POA: Diagnosis not present

## 2024-06-27 LAB — ECHOCARDIOGRAM COMPLETE
AR max vel: 2.01 cm2
AV Area VTI: 1.84 cm2
AV Area mean vel: 1.89 cm2
AV Mean grad: 3 mmHg
AV Peak grad: 5.6 mmHg
Ao pk vel: 1.18 m/s
Area-P 1/2: 3.37 cm2
Calc EF: 41.1 %
Height: 64 in
MV VTI: 1.95 cm2
S' Lateral: 4.1 cm
Single Plane A2C EF: 40.6 %
Single Plane A4C EF: 39.7 %
Weight: 2475.2 [oz_av]

## 2024-06-27 LAB — CBC
HCT: 44.3 % (ref 36.0–46.0)
Hemoglobin: 14.4 g/dL (ref 12.0–15.0)
MCH: 30.7 pg (ref 26.0–34.0)
MCHC: 32.5 g/dL (ref 30.0–36.0)
MCV: 94.5 fL (ref 80.0–100.0)
Platelets: 189 K/uL (ref 150–400)
RBC: 4.69 MIL/uL (ref 3.87–5.11)
RDW: 13.2 % (ref 11.5–15.5)
WBC: 7.3 K/uL (ref 4.0–10.5)
nRBC: 0 % (ref 0.0–0.2)

## 2024-06-27 LAB — BASIC METABOLIC PANEL WITH GFR
Anion gap: 14 (ref 5–15)
BUN: 16 mg/dL (ref 8–23)
CO2: 18 mmol/L — ABNORMAL LOW (ref 22–32)
Calcium: 9 mg/dL (ref 8.9–10.3)
Chloride: 108 mmol/L (ref 98–111)
Creatinine, Ser: 0.53 mg/dL (ref 0.44–1.00)
GFR, Estimated: >60 mL/min (ref >60)
Glucose, Bld: 105 mg/dL — ABNORMAL HIGH (ref 70–99)
Potassium: 4.1 mmol/L (ref 3.5–5.1)
Sodium: 140 mmol/L (ref 135–145)

## 2024-06-27 LAB — MAGNESIUM: Magnesium: 2.2 mg/dL (ref 1.7–2.4)

## 2024-06-27 LAB — BRAIN NATRIURETIC PEPTIDE: B Natriuretic Peptide: 983.4 pg/mL — ABNORMAL HIGH (ref 0.0–100.0)

## 2024-06-27 MED ORDER — DAPAGLIFLOZIN PROPANEDIOL 10 MG PO TABS
10.0000 mg | ORAL_TABLET | Freq: Every day | ORAL | Status: DC
  Administered 2024-06-27 – 2024-06-30 (×4): 10 mg via ORAL
  Filled 2024-06-27 (×4): qty 1

## 2024-06-27 MED ORDER — LOSARTAN POTASSIUM 25 MG PO TABS
12.5000 mg | ORAL_TABLET | Freq: Every day | ORAL | Status: DC
  Administered 2024-06-27: 12.5 mg via ORAL
  Filled 2024-06-27: qty 0.5

## 2024-06-27 MED ORDER — DILTIAZEM HCL-DEXTROSE 125-5 MG/125ML-% IV SOLN (PREMIX)
5.0000 mg/h | INTRAVENOUS | Status: DC
  Administered 2024-06-28 (×2): 5 mg/h via INTRAVENOUS
  Administered 2024-06-28 (×2): 7.5 mg/h via INTRAVENOUS
  Administered 2024-06-28: 10 mg/h via INTRAVENOUS
  Filled 2024-06-27: qty 125

## 2024-06-27 MED ORDER — PERFLUTREN LIPID MICROSPHERE
1.0000 mL | INTRAVENOUS | Status: AC | PRN
  Administered 2024-06-27: 2 mL via INTRAVENOUS

## 2024-06-27 MED ORDER — METOPROLOL TARTRATE 50 MG PO TABS
50.0000 mg | ORAL_TABLET | Freq: Two times a day (BID) | ORAL | Status: DC
  Administered 2024-06-27 – 2024-06-30 (×6): 50 mg via ORAL
  Filled 2024-06-27 (×6): qty 1

## 2024-06-27 MED ORDER — LOSARTAN POTASSIUM 25 MG PO TABS
25.0000 mg | ORAL_TABLET | Freq: Every day | ORAL | Status: DC
  Administered 2024-06-28 – 2024-06-30 (×3): 25 mg via ORAL
  Filled 2024-06-27 (×3): qty 1

## 2024-06-27 MED ORDER — METOPROLOL TARTRATE 5 MG/5ML IV SOLN
5.0000 mg | Freq: Once | INTRAVENOUS | Status: AC
  Administered 2024-06-27: 5 mg via INTRAVENOUS
  Filled 2024-06-27: qty 5

## 2024-06-27 NOTE — Progress Notes (Signed)
  PROGRESS NOTE    Teresa [name]  FMW:[id number] DOB: 04-Jun-1941 DOA: 06/26/2024 PCP: Bertrum Charlie CROME, MD  [id number]  LOS: 0 days   Brief hospital course:   Assessment & Plan:  Teresa [name]  is a 83 y.o. female,with ICM, LVEF (35-40%), CAD s/p CABG 1994 with LIMA-LAD, HTN, Hyperlipidemia, AF s/p PVI.  - Presents to ED secondary to complaints of palpitation, weakness and fatigue over the last 2 to 3 days, patient reports some history of heart racing/palpitation  - In ED patient was noted to have heart rate 148 on presentation, A-fib with RVR, she was started on Cardizem  drip    A-fib with RVR -Heart rate in the 140s upon presentation, she reports palpitation and fatigue, she does report compliance with medications, no clear why she went into with RVR. - She was started on Cardizem  drip, and home dose Toprol  increased from 12.5 mg to 25 mg.  Cardio consulted on admission. --weaned off dilt gtt overnight, however, HR went up again in the evening. --IV metop 5  --increase metop to lopressor  50 BID --cont Eliquis    CAD status post CABG 1994 Ischemic cardiomyopathy  --cont statin  Chronic combined CHF EF 35% by echo in August 2024.  Current Echo with improved LVEF 40-45%. -Does not appear to be in decompensated CHF currently. --hold diuretic   Elevated troponins - Her troponins are borderline elevated, this is likely in setting of demand ischemia from her A-fib with RVR   Hyperlipidemia - Continue with statin   COPD --stable   Rheumatoid arthritis/Psoriatic arthritis  arthritis - Resume Otezla  after discharge   History of dementia - Continue with Aricept  and Namenda    DVT prophylaxis: On:Eliquis  Code Status: Full code  Family Communication:  Level of care: Telemetry Medical Dispo:   The patient is from: home Anticipated d/c is to: home Anticipated d/c date is: 1-2 days   Subjective and Interval History:  Pt reported feeling of palpitation resolved.  No CP,  no dyspnea.   Objective: Vitals:   06/27/24 1100 06/27/24 1113 06/27/24 1428 06/27/24 1522  BP: 118/60   (!) 132/91  Pulse: (!) 46   99  Resp: (!) 22   14  Temp:   98.3 F (36.8 C)   TempSrc:      SpO2: 97% 100%  98%  Weight:      Height:       No intake or output data in the 24 hours ending 06/27/24 1914 Filed Weights   06/26/24 1415  Weight: 70.2 kg    Examination:   Constitutional: NAD, AAOx3 HEENT: conjunctivae and lids normal, EOMI CV: No cyanosis.   RESP: normal respiratory effort, on RA Neuro: II - XII grossly intact.   Psych: Normal mood and affect.  Appropriate judgement and reason   Data Reviewed: I have personally reviewed labs and imaging studies  Time spent: 50 minutes  Teresa Haber, MD [facility] If 7PM-7AM, please contact night-coverage 06/27/2024, 7:14 PM

## 2024-06-27 NOTE — Progress Notes (Addendum)
 Pt still afib rvr 138. Pt asymptomatic. MD Elgergawy made aware.  Update 2332: See new orders.  Update 0535: Cardizem  gtt pause d/t loss of IV accessed. Awaiting IV team to place a new one. Incoming shift made aware.

## 2024-06-27 NOTE — Consult Note (Addendum)
 Teresa [name]       Patient ID: Teresa [name] MRN: [id number] DOB/AGE: August 13, 1941 82 y.o.  Admit date: 06/26/2024 Referring Physician Dr. Sherlon Primary Physician Teresa Charlie CROME, MD Primary Cardiologist [facility]) Reason for Consultation AF RVR  HPI: Teresa [name] is a 83 y.o. female  with a past medical history of Alzheimer's dementia, paroxsymal atrial fibrillation s/p PVI (05/2019) s/p DCCV (07/2023), ischemic cardiomyopathy, chronic HFrEF (35%), coronary artery disease s/p CABG (1994), hypertension, hyperlipidemia, COPD who presented to the ED on 06/26/2024 for palpitations, weakness and fatigue for past 3 days. EKG in ED with AF RVR rate 130s. Cardiology was consulted for further evaluation.   Majority of patient's history obtained from chart review due to patients baseline dementia/ poor historian. Patient presented to the ED with palpitations, weakness and fatigue for past 3 days. Work up in the ED notable for Na 141, K 4.1, Cr 0.89, Mg 2.1, Hgb 14.9, plts 208. TSH within normal limits. CXR with mild pulmonary edema. Trops 30 > 35. EKG in ED with atrial fibrillation, LBBB rate 133 bpm. Patient started on IV Cardizem  gtt and discontinued early next morning. Patient resumed on home Eliquis  and metoprolol .   At the time of my evaluation this AM, patient was resting comfortably in ED stretcher.  Discussed patient symptoms in further detail.  Patient unable to provide detailed history due to baseline dementia.  Patient states her sister checked up on her at her home and noticed that her heart rate was elevated and was then sent to ED.  Patient states she did not feel any symptoms.  Patient denies any chest pain, shortness of breath, lightheadedness or palpitations.  Patient has known history of atrial fibrillation s/p PVI in 2020 and s/p DCCV and 07/2023.  Patient states she does not remember having these procedures.   Review of systems  complete and found to be negative unless listed above    Past Medical History:  Diagnosis Date   Arthritis    Asthma    Back pain    Breast cancer, left (HCC) 2009   CAD (coronary artery disease)    a. 1994 s/p CABG x 1 (LIMA->LAD); b. 03/2015 MV: No ischemia; c. MV 11/18: small fixed apical defect likely secondary to breast attenuation, EF of 42%, frequent PVCs; d. 12/2017 Cath: LM nl, LAD 20p, D1/2/3 nl, LCX min irregs, OM1/2/3 min irregs, RCA nl, RPDA nl, RPL1/2 nl, LIMA->LAD atretic.   Chronic anticoagulation    Apixaban    Chronic combined systolic (congestive) and diastolic (congestive) heart failure (HCC)    a. 2013 EF 40%;  b. 03/2015 Echo: EF 55-60%; c. 12/18 Echo: EF of 35-40%; d. 12/2017 TEE: EF 35-40%; e. 03/2018 Echo: EF 40-45%; f. 09/2018 Echo: EF 35%.   DDD (degenerative disc disease), lumbar    Dental crowns present    caps- left back top, right back bottom   Gastroesophageal reflux disease    Hyperlipidemia    Hypertension    LBBB (left bundle branch block)    MI (myocardial infarction) (HCC) 1994   Mixed Ischemic & Nonischemic cardiomyopathy    a. 2013 EF 40%;  b. 03/2015 Echo: EF 55-60%; c. 12/18 Echo: EF of 35-40%, hypokinesis of the anteroseptal, and apical myocardium, mild to mod MR, mod dil LA, nl RV fxn, PASP 53 mmHg; d. 12/2017 TEE: EF 35-40%, diff HK, mild to mod MR. small PFO. No LAA/RAA thrombus; e. 03/2018 Echo: EF 40-45%, antsept/inf HK, Gr2  DD, mild MR, mod idl LA, mild to mod TR, PASP 35-58mmHg; f. 09/2018 Echo: EF 35%.   Osteoarthritis    left hip and knee   Persistent atrial fibrillation (HCC)    a. diagnosed 01/13/2018; b. CHADS2VASc = 6 --> Eliquis ; c. 12/2017 s/p TEE/DCCV. Amio started but d/c'd 01/2018 2/2 n/anorexia; d. 10/2018 DCCV-->recurrent Afib w/in days; e. 10/2018 DCCV x 2 in ED->persistent Afib.   Personal history of radiation therapy    PONV (postoperative nausea and vomiting)    Psoriasis    PSVT (paroxysmal supraventricular tachycardia) (HCC)     a. 02/2015 Holter: short runs of SVT and NSVT.   Pulmonary hypertension (HCC)    PVC's (premature ventricular contractions)    a. 03/2018 24h Holter: Freq PVC's with a total of 421 beats in 24 hrs. 7 short runs of SVT likely representing Afib.   Rheumatoid arthritis (HCC)    feet, hands   Vertigo    approx 2x/yr    Past Surgical History:  Procedure Laterality Date   ABDOMINAL HYSTERECTOMY     APPENDECTOMY     BREAST BIOPSY Right 1991   negative   BREAST EXCISIONAL BIOPSY Left 2009   positive   BREAST LUMPECTOMY Left 2009   CARDIAC CATHETERIZATION N/A 05/27/1988   Location: [facility]; Surgeon: Miquel Ellen, MD   CARDIOVERSION N/A 01/15/2018   Procedure: CARDIOVERSION;  Surgeon: Darron Deatrice LABOR, MD;  Location: [facility];  Service: Cardiovascular;  Laterality: N/A;   CARDIOVERSION N/A 10/29/2018   Procedure: CARDIOVERSION;  Surgeon: Perla Evalene PARAS, MD;  Location: [facility];  Service: Cardiovascular;  Laterality: N/A;   CARDIOVERSION N/A 11/11/2018   DCCV x 2 attempts at 120 J; Location: [facility] ED   CERVICAL FUSION  2019   CHOLECYSTECTOMY     COLONOSCOPY     CORONARY ARTERY BYPASS GRAFT  04/11/1993   1v; LIMA-LAD; Location: [facility]; Surgeon: Lynwood Berg, MD   FOOT ARTHRODESIS Right 07/24/2016   Procedure: FUSION FIRST METATARSAL CUNEIFORM JOINT RIGHT FOOT, FUSION SECOND METATARSAL CUNEFORM JOINT BUNION REPAIR RIGHT FOOT;  Surgeon: Donnice Cory, DPM;  Location: [facility];  Service: Podiatry;  Laterality: Right;   HARDWARE REMOVAL Right 07/24/2016   Procedure: REMOVAL HARDWARE LATERAL MALLEOUS RIGHT ANKLE;  Surgeon: Donnice Cory, DPM;  Location: [facility];  Service: Podiatry;  Laterality: Right;  REMOVAL OF PIN WHICH WAS INTACT   HERNIA REPAIR     ventral   KNEE ARTHROSCOPY Left    LUMBAR FUSION     Pulmonary vein isolation (cardiac ablation) N/A 05/27/2019   Location: [facility]; Surgeon: Lamar Kerns, MD   RIGHT/LEFT HEART CATH AND CORONARY/GRAFT ANGIOGRAPHY N/A  01/14/2018   Procedure: LEFT HEART CATH AND CORONARY ANGIOGRAPHY;  Surgeon: Darron Deatrice LABOR, MD;  Location: [facility];  Service: Cardiovascular;  Laterality: N/A;   TEE WITHOUT CARDIOVERSION N/A 01/15/2018   Procedure: TRANSESOPHAGEAL ECHOCARDIOGRAM (TEE);  Surgeon: Darron Deatrice LABOR, MD;  Location: [facility];  Service: Cardiovascular;  Laterality: N/A;   TONSILLECTOMY     TOTAL HIP ARTHROPLASTY Left    TOTAL HIP ARTHROPLASTY Right 07/11/2021   Procedure: TOTAL HIP ARTHROPLASTY ANTERIOR APPROACH;  Surgeon: Kathlynn Sharper, MD;  Location: [facility];  Service: Orthopedics;  Laterality: Right;   TOTAL KNEE ARTHROPLASTY Left 12/19/2013   TUBAL LIGATION      (Not in a hospital admission)  Social History   Socioeconomic History   Marital status: Divorced    Spouse name: Not on file   Number of children: 2  Years of education: Not on file   Highest education level: 11th grade  Occupational History   Occupation: retired  Tobacco Use   Smoking status: Former    Current packs/day: 0.00    Average packs/day: 0.5 packs/day for 35.0 years (17.5 ttl pk-yrs)    Types: Cigarettes    Start date: 32    Quit date: 1991    Years since quitting: 34.5   Smokeless tobacco: Never   Tobacco comments:    Quit approx 1990  Vaping Use   Vaping status: Never Used  Substance and Sexual Activity   Alcohol  use: No   Drug use: No   Sexual activity: Not on file  Other Topics Concern   Not on file  Social History Narrative   Not on file   Social Drivers of Health   Financial Resource Strain: Low Risk  (06/03/2023)   Received from [facility]   Overall Financial Resource Strain (CARDIA)    Difficulty of Paying Living Expenses: Not hard at all  Food Insecurity: No Food Insecurity (06/03/2023)   Received from [facility]   Hunger Vital Sign    Within the past 12 months, you worried that your food would run out before you got the money to buy more.: Never  true    Within the past 12 months, the food you bought just didn't last and you didn't have money to get more.: Never true  Transportation Needs: No Transportation Needs (06/03/2023)   Received from [facility] - Transportation    In the past 12 months, has lack of transportation kept you from medical appointments or from getting medications?: No    Lack of Transportation (Non-Medical): No  Physical Activity: Insufficiently Active (06/03/2023)   Received from [facility]   Exercise Vital Sign    On average, how many days per week do you engage in moderate to strenuous exercise (like a brisk walk)?: 3 days    On average, how many minutes do you engage in exercise at this level?: 20 min  Stress: No Stress Concern Present (06/03/2023)   Received from [facility] - Occupational Stress Questionnaire    Feeling of Stress : Only a little  Social Connections: Moderately Isolated (07/02/2022)   Social Connection and Isolation Panel    Frequency of Communication with Friends and Family: More than three times a week    Frequency of Social Gatherings with Friends and Family: Once a week    Attends Religious Services: More than 4 times per year    Active Member of [employer] or Organizations: No    Attends Banker Meetings: Never    Marital Status: Divorced  Catering manager Violence: Not At Risk (07/02/2022)   Humiliation, Afraid, Rape, and Kick questionnaire    Fear of Current or Ex-Partner: No    Emotionally Abused: No    Physically Abused: No    Sexually Abused: No    Family History  Problem Relation Age of Onset   Heart attack Father    Congestive Heart Failure Mother    Breast cancer Mother 52   Kidney failure Sister    Prostate cancer Brother    Bladder Cancer Brother    Lung cancer Sister    Arthritis Sister    Breast cancer Sister    Arthritis Sister    Breast cancer  Sister    Lung cancer  Sister    Lung cancer Brother    Arthritis Brother      Vitals:   06/27/24 0452 06/27/24 0500 06/27/24 0600 06/27/24 0626  BP:  102/61 (!) 107/47   Pulse: 98  99 86  Resp: 18 18 14 16   Temp:      TempSrc:      SpO2: 94% 100% 96% 96%  Weight:      Height:        PHYSICAL EXAM General: Chronically ill-appearing elderly female, well nourished, in no acute distress. HEENT: Normocephalic and atraumatic. Neck: No JVD.   Lungs: Normal respiratory effort on air. Clear bilaterally to auscultation. No wheezes, crackles, rhonchi.  Heart: Irregularly irregular, controlled rates. Normal S1 and S2 without gallops or murmurs.  Abdomen: Non-distended appearing.  Msk: Normal strength and tone for age. Extremities: Warm and well perfused. No clubbing, cyanosis, edema.  Neuro: Alert and oriented X 3. Psych: Answers questions appropriately, slow to respond.  Labs: Basic Metabolic Panel: Recent Labs    06/26/24 1424 06/26/24 1624  NA 141  --   K 4.1  --   CL 109  --   CO2 23  --   GLUCOSE 101*  --   BUN 16  --   CREATININE 0.89  --   CALCIUM  9.6  --   MG  --  2.1   Liver Function Tests: No results for input(s): AST, ALT, ALKPHOS, BILITOT, PROT, ALBUMIN in the last 72 hours. No results for input(s): LIPASE, AMYLASE in the last 72 hours. CBC: Recent Labs    06/26/24 1424  WBC 5.7  HGB 14.9  HCT 46.2*  MCV 94.7  PLT 208   Cardiac Enzymes: Recent Labs    06/26/24 1424 06/26/24 1624  TROPONINIHS 30* 35*   BNP: No results for input(s): BNP in the last 72 hours. D-Dimer: No results for input(s): DDIMER in the last 72 hours. Hemoglobin A1C: No results for input(s): HGBA1C in the last 72 hours. Fasting Lipid Panel: No results for input(s): CHOL, HDL, LDLCALC, TRIG, CHOLHDL, LDLDIRECT in the last 72 hours. Thyroid  Function Tests: Recent Labs    06/26/24 1424  TSH 1.463   Anemia Panel: No results for input(s):  VITAMINB12, FOLATE, FERRITIN, TIBC, IRON, RETICCTPCT in the last 72 hours.   Radiology: [facility] Chest Port 1 View Result Date: 06/26/2024 CLINICAL DATA:  [id number] A-fib [facility]) [id number] EXAM: PORTABLE CHEST 1 VIEW COMPARISON:  Chest x-ray 07/20/2023 FINDINGS: The heart and mediastinal contours are unchanged. Atherosclerotic plaque. No focal consolidation. Mild pulmonary edema. No pleural effusion. No pneumothorax. No acute osseous abnormality.  Sternotomy wires are intact. IMPRESSION: 1. Mild pulmonary edema. 2.  Aortic Atherosclerosis (ICD10-I70.0). Electronically Signed   By: Morgane  Naveau M.D.   On: 06/26/2024 16:09    ECHO pending  TELEMETRY reviewed by me 06/27/2024: Atrial fibrillation, rate 80s to 90s  EKG reviewed by me: atrial fibrillation, LBBB rate 133 bpm  Data reviewed by me 06/27/2024: last 24h vitals tele labs imaging I/O ED provider [name], admission H&P.  Principal Problem:   Atrial fibrillation with RVR (HCC) Active Problems:   S/P CABG (coronary artery bypass graft)   Psoriatic arthritis (HCC)   Rheumatoid arthritis (HCC)    ASSESSMENT AND PLAN:  Teresa [name] is a 84 y.o. female  with a past medical history of paroxsymal atrial fibrillation s/p PVI (05/2019) s/p DCCV (07/2023), ischemic cardiomyopathy, chronic HFrEF (35%), coronary artery disease s/p CABG (1994), hypertension, hyperlipidemia, COPD who presented to the ED on  06/26/2024 for palpitations, weakness and fatigue for past 3 days. EKG in ED with AF RVR rate 130s. Cardiology was consulted for further evaluation.   # Atrial fibrillation RVR  # Paroxsymal AF s/p PVI (2020) s/p DCCV (07/2023) Patient presents with weakness, fatigue.  Patient denies any palpitations.  EKG in ED with AF RVR rate 130s.  Heart rate improved s/p Cardizem  drip, d/c due to low EF.  Patient with known history of AF s/p PVI and DCCV. Discussions outpatient about redoing PVI due to recurrence of AF. Patient has documented allergy to Amiodarone .  Per tele atrial fibrillation with rates 80s to 90s. -Monitor and replenish electrolytes for a goal K >4, Mag >2  -Continue home Eliquis  5 mg BID for stroke risk reduction. -Continue home metoprolol  succinate 25 mg daily. May uptitrate as BP allows.  -Patient has documented allergy to amiodarone .  Will optimize rate control and defer using rhythm management at this time.  # Ischemic Cardiomyopathy  # Chronic HFrEF Patient without SOB, LEE.  Patient appears near euvolemic on exam. CXR with mild pulmonary edema in setting of AF RVR. -Echo pending.  Further recommendations pending results. -Continue metoprolol  as stated above.  -Ordered low dose losartan  12.5 mg daily for now as BP is borderline. Will plan to resume home Entresto  24-26 mg BID as BP allows.  -Ordered dapagliflozin  10 mg daily. (Copay $0) -Plan to start spironolactone  tomorrow. -Will plan to optimize GDMT as BP and renal function allow.  # Coronary artery disease s/p CABG (1994) # Hypertension # Hyperlipidemia  Patient without chest pain. Trops 30 > 35. EKG in ED with atrial fibrillation, LBBB rate 133 bpm (known LBBB).  -Continue home rosuvastatin  5 mg daily.  -Continue metoprolol  as stated above. -Minimally elevated and flat trops in setting of AF RVR is most consistent with demand/supply mismatch and not ACS   This patient's plan of care was discussed and created with Dr. Florencio and he is in agreement.  Signed: Dorene Comfort, PA-C  06/27/2024, 6:57 AM [facility]

## 2024-06-27 NOTE — Care Management Obs Status (Signed)
 MEDICARE OBSERVATION STATUS NOTIFICATION   Patient Details  Name: Teresa [name] MRN: [id number] Date of Birth: [birth year]   Medicare Observation Status Notification Given:  Chaney BRANDY CHRISTIANE LELON, CMA 06/27/2024, 1:28 PM

## 2024-06-27 NOTE — Telephone Encounter (Signed)
 Patient Product/process development scientist completed.    The patient is insured through [employer]. Patient has Medicare and is not eligible for a copay card, but may be able to apply for patient assistance or Medicare RX Payment Plan (Patient Must reach out to their plan, if eligible for payment plan), if available.    Ran test claim for Farxiga 10 mg and the current 30 day co-pay is $0.00.   This test claim was processed through [facility]- copay amounts may vary at other pharmacies due to pharmacy/plan contracts, or as the patient moves through the different stages of their insurance plan.     Morgan Arab, CPHT Pharmacy Technician III Certified Patient Advocate [facility] Patient Advocate Team Direct Number: [phone]  Fax: [phone]

## 2024-06-27 NOTE — Evaluation (Signed)
 Physical Therapy Evaluation Patient Details Name: Teresa [name] MRN: [id number] DOB: 06/03/1941 Today's Date: 06/27/2024  History of Present Illness  Teresa [name] is a 83 y.o. female with a history of CAD, atrial fibrillation on Eliquis , hyperlipidemia, and GERD who presents with concern for atrial fibrillation over the last 2-3 days.  Clinical Impression  Pt admitted with above diagnosis. Pt currently with functional limitations due to the deficits listed below (see PT Problem List). Pt received inclined in bed agreeable to PT. Reports PTA being fully independent and still driving.   To date, HR in a-fib ranging from 75-103 BPM at rest and asymptomatic. Pt exits bed mod-I with bed features and increased time. Pt able to half way don sock in L foot but needs assist to complete task and dependent for R foot. X1 STS CGA ambulating around room needing consistent CGA and either HHA+1 Or SUE on bed features to ambulate. Pt reports R hip pain/stiffness due to being in bed leading to step to gait and minimal foot clearance. Encouraged return to EOB to provide RW. With RW pt still reliant on CGA for STS but able to ambulate at supervision level in room with step through gait pattern. Max HR with gait upwards to 130 BPM. Pt endorses need for BM thus seated on commode and independent with pericare and mod-I for STS and hand hygiene returning to supine in bed at CGA. Pt appearing visibly fatigued reports greater weakness than baseline. Discussed on use of AD for energy conservation and reduced falls risk and benefit of 24/7 supervision at home for assistance. Pt with all needs in reach. Pt will benefit from skilled PT services to address acute deficits in strength and gait to maximize return to PLOF.       If plan is discharge home, recommend the following: A little help with walking and/or transfers;A little help with bathing/dressing/bathroom;Assistance with cooking/housework;Assist for transportation;Help  with stairs or ramp for entrance   Can travel by private vehicle        Equipment Recommendations None recommended by PT  Recommendations for Other Services       Functional Status Assessment Patient has had a recent decline in their functional status and demonstrates the ability to make significant improvements in function in a reasonable and predictable amount of time.     Precautions / Restrictions Precautions Precautions: Fall Precaution/Restrictions Comments: monitor HR Restrictions Weight Bearing Restrictions Per Provider Order: No      Mobility  Bed Mobility Overal bed mobility: Modified Independent             General bed mobility comments: increased time Patient Response: Cooperative  Transfers Overall transfer level: Needs assistance Equipment used: None, Rolling walker (2 wheels) Transfers: Sit to/from Stand Sit to Stand: Contact guard assist           General transfer comment: x2 no AD, x1 to RW    Ambulation/Gait Ambulation/Gait assistance: Contact guard assist, Supervision Gait Distance (Feet): 40 Feet Assistive device: Rolling walker (2 wheels), None Gait Pattern/deviations: Step-to pattern, Step-through pattern, Decreased step length - right, Decreased step length - left       General Gait Details: close to shuffling gait without AD needing HHA or SUE support on surfaces to ambulate. Improved but minimal step through gait using RW but able to complete at supervision level.  Stairs            Wheelchair Mobility     Tilt Bed Tilt Bed Patient Response: Cooperative  Modified Rankin (Stroke Patients Only)       Balance Overall balance assessment: Needs assistance Sitting-balance support: No upper extremity supported, Feet supported Sitting balance-Leahy Scale: Good Sitting balance - Comments: able to perform anterior trunk lean to feet to attempt to don socks     Standing balance-Leahy Scale: Poor Standing balance comment:  Reliant on at least SUE support for standing                             Pertinent Vitals/Pain Pain Assessment Pain Assessment: Faces Faces Pain Scale: Hurts a little bit Pain Location: R hip Pain Descriptors / Indicators: Aching, Grimacing, Discomfort Pain Intervention(s): Limited activity within patient's tolerance, Monitored during session, Repositioned    Home Living Family/patient expects to be discharged to:: Private residence Living Arrangements: Alone Available Help at Discharge: Family;Available PRN/intermittently Type of Home: House Home Access: Stairs to enter Entrance Stairs-Rails: Right;Left;Can reach both Entrance Stairs-Number of Steps: 5   Home Layout: Multi-level;Able to live on main level with bedroom/bathroom Home Equipment: Rollator (4 wheels);BSC/3in1;Cane - single point      Prior Function Prior Level of Function : Independent/Modified Independent               ADLs Comments: still driving     Extremity/Trunk Assessment   Upper Extremity Assessment Upper Extremity Assessment: Overall WFL for tasks assessed    Lower Extremity Assessment Lower Extremity Assessment: Generalized weakness       Communication   Communication Communication: No apparent difficulties    Cognition Arousal: Alert Behavior During Therapy: WFL for tasks assessed/performed   PT - Cognitive impairments: No apparent impairments                         Following commands: Intact       Cueing Cueing Techniques: Verbal cues     General Comments General comments (skin integrity, edema, etc.): HR ranging from 75 BPM at rest upwards to 138 BPM with functional mobility.    Exercises Other Exercises Other Exercises: Role of PT in acute setting, d/c recs, safe use of AD to reduce falls risk, graded mobility due to heart arrythmia   Assessment/Plan    PT Assessment Patient needs continued PT services  PT Problem List Decreased  strength;Decreased mobility;Decreased activity tolerance;Decreased balance;Cardiopulmonary status limiting activity       PT Treatment Interventions DME instruction;Therapeutic exercise;Gait training;Balance training;Stair training;Neuromuscular re-education;Functional mobility training;Therapeutic activities;Patient/family education    PT Goals (Current goals can be found in the Care Plan section)  Acute Rehab PT Goals Patient Stated Goal: to feel better PT Goal Formulation: With patient Time For Goal Achievement: 07/11/24 Potential to Achieve Goals: Good    Frequency Min 3X/week     Co-evaluation               AM-PAC PT 6 Clicks Mobility  Outcome Measure Help needed turning from your back to your side while in a flat bed without using bedrails?: A Little Help needed moving from lying on your back to sitting on the side of a flat bed without using bedrails?: A Little Help needed moving to and from a bed to a chair (including a wheelchair)?: A Little Help needed standing up from a chair using your arms (e.g., wheelchair or bedside chair)?: A Little Help needed to walk in hospital room?: A Little Help needed climbing 3-5 steps with a railing? : A Lot 6 Click  Score: 17    End of Session Equipment Utilized During Treatment: Gait belt Activity Tolerance: Patient tolerated treatment well Patient left: in bed;with call bell/phone within reach;with bed alarm set Nurse Communication: Mobility status PT Visit Diagnosis: Difficulty in walking, not elsewhere classified (R26.2);History of falling (Z91.81);Muscle weakness (generalized) (M62.81)    Time: 8987-8955 PT Time Calculation (min) (ACUTE ONLY): 32 min   Charges:   PT Evaluation $PT Eval Moderate Complexity: 1 Mod PT Treatments $Therapeutic Activity: 8-22 mins PT General Charges $$ ACUTE PT VISIT: 1 Visit         Dorina HERO. Fairly IV, PT, DPT Physical Therapist- [place]  [facility] 06/27/2024, 12:31 PM

## 2024-06-28 DIAGNOSIS — I4891 Unspecified atrial fibrillation: Secondary | ICD-10-CM | POA: Diagnosis not present

## 2024-06-28 LAB — DIGOXIN LEVEL: Digoxin Level: 0.8 ng/mL (ref 0.8–2.0)

## 2024-06-28 MED ORDER — DIGOXIN 0.25 MG/ML IJ SOLN
0.2500 mg | Freq: Once | INTRAMUSCULAR | Status: AC
  Administered 2024-06-28: 0.25 mg via INTRAVENOUS
  Filled 2024-06-28: qty 2

## 2024-06-28 MED ORDER — DIGOXIN 0.25 MG/ML IJ SOLN: 0.2500 mg | Freq: Once | INTRAMUSCULAR | Status: DC

## 2024-06-28 MED ORDER — DIGOXIN 0.25 MG/ML IJ SOLN: 0.1250 mg | Freq: Every day | INTRAMUSCULAR | Status: DC

## 2024-06-28 MED ORDER — DIGOXIN 0.25 MG/ML IJ SOLN: 0.5000 mg | Freq: Once | INTRAMUSCULAR | Status: DC

## 2024-06-28 MED ORDER — ORAL CARE MOUTH RINSE: 15.0000 mL | OROMUCOSAL | Status: DC | PRN

## 2024-06-28 MED ORDER — DIGOXIN 125 MCG PO TABS
0.0625 mg | ORAL_TABLET | Freq: Every day | ORAL | Status: DC
  Administered 2024-06-29 – 2024-06-30 (×2): 0.0625 mg via ORAL
  Filled 2024-06-28 (×2): qty 0.5

## 2024-06-28 MED ORDER — FUROSEMIDE 10 MG/ML IJ SOLN
40.0000 mg | Freq: Once | INTRAMUSCULAR | Status: AC
  Administered 2024-06-28: 40 mg via INTRAVENOUS
  Filled 2024-06-28: qty 4

## 2024-06-28 NOTE — Progress Notes (Signed)
  PROGRESS NOTE    Teresa [name]  FMW:[id number] DOB: 1941/05/10 DOA: 06/26/2024 PCP: Bertrum Charlie CROME, MD  [id number]/[id number]-[id number]  LOS: 1 day   Brief hospital course:   Assessment & Plan:  Teresa [name]  is a 83 y.o. female,with ICM, LVEF (35-40%), CAD s/p CABG 1994 with LIMA-LAD, HTN, Hyperlipidemia, AF s/p PVI.  - Presents to ED secondary to complaints of palpitation, weakness and fatigue over the last 2 to 3 days, patient reports some history of heart racing/palpitation  - In ED patient was noted to have heart rate 148 on presentation, A-fib with RVR, she was started on Cardizem  drip    A-fib with RVR -Heart rate in the 140s upon presentation, she reports palpitation and fatigue, she does report compliance with medications, no clear why she went into with RVR. - She was started on Cardizem  drip, and home dose Toprol  increased from 12.5 mg to 25 mg.  Cardio consulted on admission. --weaned off dilt gtt overnight, however, HR went up again in the evening. 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  Current Echo with improved LVEF 40-45%. --IV lasix  40 today --cont losartan  --cont Farxiga    Elevated troponins - Her troponins are borderline elevated, this is likely in setting of demand ischemia from her A-fib with RVR   Hyperlipidemia - Continue with statin   COPD --stable   Rheumatoid arthritis/Psoriatic arthritis  arthritis - Resume Otezla  after discharge   History of dementia - cont home Aricept  and Namenda    DVT prophylaxis: On:Eliquis  Code Status: Full code  Family Communication:  Level of care: Telemetry Medical Dispo:   The patient is from: home Anticipated d/c is to: home Anticipated d/c date is: 1-2 days   Subjective and Interval History:  HR went up to 130's  again overnight.   Pt reported no dyspnea at rest.   Objective: Vitals:   06/28/24 1400 06/28/24 1500 06/28/24 1600 06/28/24 1700  BP:   (!) 119/102 129/86  Pulse: (!) 57 (!) 36 63 (!) 123  Resp: 14 17 20 14   Temp:   97.8 F (36.6 C)   TempSrc:   Axillary   SpO2: 97% 97% 98% 99%  Weight:      Height:        Intake/Output Summary (Last 24 hours) at 06/28/2024 1733 Last data filed at 06/28/2024 1700 Gross per 24 hour  Intake 468.7 ml  Output 850 ml  Net -381.3 ml   Filed Weights   06/26/24 1415  Weight: 70.2 kg    Examination:   Constitutional: NAD, alert, oriented to person and place HEENT: conjunctivae and lids normal, EOMI CV: No cyanosis.   RESP: normal respiratory effort, on RA Neuro: II - XII grossly intact.   Psych: Normal mood and affect.  Appropriate judgement and reason   Data Reviewed: I have personally reviewed labs and imaging studies  Time spent: 35 minutes  Ellouise Haber, MD [facility] If 7PM-7AM, please contact night-coverage 06/28/2024, 5:33 PM

## 2024-06-28 NOTE — Plan of Care (Signed)
  Problem: Health Behavior/Discharge Planning: Goal: Ability to manage health-related needs will improve Outcome: Progressing   Problem: Clinical Measurements: Goal: Respiratory complications will improve Outcome: Progressing   Problem: Clinical Measurements: Goal: Cardiovascular complication will be avoided Outcome: Progressing   Problem: Activity: Goal: Risk for activity intolerance will decrease Outcome: Progressing   Problem: Pain Managment: Goal: General experience of comfort will improve and/or be controlled Outcome: Progressing   Problem: Safety: Goal: Ability to remain free from injury will improve Outcome: Progressing

## 2024-06-28 NOTE — Progress Notes (Signed)
   06/27/24 1923  Assess: MEWS Score  Temp 97.6 F (36.4 C)  BP (!) 143/81  MAP (mmHg) 98  Pulse Rate (!) 141  Resp (!) 22  SpO2 100 %  O2 Device Room Air  Assess: MEWS Score  MEWS Temp 0  MEWS Systolic 0  MEWS Pulse 3  MEWS RR 1  MEWS LOC 0  MEWS Score 4  MEWS Score Color Red  Assess: if the MEWS score is Yellow or Red  Were vital signs accurate and taken at a resting state? Yes  Does the patient meet 2 or more of the SIRS criteria? Yes  Does the patient have a confirmed or suspected source of infection? No  MEWS guidelines implemented  Yes, red  Treat  MEWS Interventions Considered administering scheduled or prn medications/treatments as ordered  Take Vital Signs  Increase Vital Sign Frequency  Red: Q1hr x2, continue Q4hrs until patient remains green for 12hrs  Escalate  MEWS: Escalate Red: Discuss with charge nurse and notify provider. Consider notifying RRT. If remains red for 2 hours consider need for higher level of care  Notify: Charge Nurse/RN  Name of Charge Nurse/RN Notified Dickey, RN  Provider Notification  Provider Name/Title MD Elgergawy  Date Provider Notified 06/27/24  Time Provider Notified 2250  Method of Notification Page  Notification Reason  (Afib RVR)  Provider response See new orders  Date of Provider Response 06/27/24  Time of Provider Response 2332  Assess: SIRS CRITERIA  SIRS Temperature  0  SIRS Respirations  1  SIRS Pulse 1  SIRS WBC 0  SIRS Score Sum  2

## 2024-06-28 NOTE — TOC Initial Note (Addendum)
 Transition of Care [facility]) - Initial/Assessment Note    Patient Details  Name: Teresa [name] MRN: [id number] Date of Birth: 1941-03-29  [facility] CM/SW Contact:    Lauraine JAYSON Carpen, LCSW Phone Number: 06/28/2024, 1:41 PM  Clinical Narrative:  CSW met with patient. No family at bedside. CSW introduced role and explained that PT recommendations would be discussed. Patient is not interested in home health at this time. CSW encouraged her to notify [facility] team if she changes her mind prior to discharge or her PCP if she changes her mind after discharge. No further concerns. CSW will continue to follow patient for support and facilitate return home once stable. Her daughter will transport her home at discharge.                Expected Discharge Plan: Home/Self Care Barriers to Discharge: Continued Medical Work up   Patient Goals and CMS Choice            Expected Discharge Plan and Services     Post Acute Care Choice: NA Living arrangements for the past 2 months: Single Family Home                                      Prior Living Arrangements/Services Living arrangements for the past 2 months: Single Family Home Lives with:: Self Patient language and need for interpreter reviewed:: Yes Do you feel safe going back to the place where you live?: Yes      Need for Family Participation in Patient Care: Yes (Comment) Care giver support system in place?: Yes (comment)   Criminal Activity/Legal Involvement Pertinent to Current Situation/Hospitalization: No - Comment as needed  Activities of Daily Living      Permission Sought/Granted                  Emotional Assessment Appearance:: Appears stated age Attitude/Demeanor/Rapport: Engaged Affect (typically observed): Appropriate, Calm, Pleasant Orientation: : Oriented to Self, Oriented to Place, Oriented to  Time, Oriented to Situation Alcohol  / Substance Use: Not Applicable Psych Involvement: No  (comment)  Admission diagnosis:  Atrial fibrillation with rapid ventricular response (HCC) [I48.91] Atrial fibrillation with RVR (HCC) [I48.91] Patient Active Problem List   Diagnosis Date Noted   Anticoagulated 07/01/2022   Paroxysmal atrial fibrillation (HCC) 07/01/2022   Status post catheter ablation of atrial fibrillation 07/01/2022   Status post total hip replacement, right 07/11/2021   Need for prophylactic vaccination against rabies 10/11/2020   Puncture wound 10/11/2020   Need for Tdap vaccination 10/11/2020   Dog bite of right lower leg with infection 10/11/2020   Neuropathy 05/02/2020   Chronic obstructive pulmonary disease (HCC) 03/11/2019   Atrial fibrillation (HCC) 11/11/2018   A-fib (HCC) 10/12/2018   Rheumatoid arthritis (HCC) 10/10/2018   Atrial fibrillation with RVR (HCC) 10/10/2018   Fusion of spine of cervical region 09/25/2018   Psoriatic arthritis (HCC) 03/04/2018   Unstable angina (HCC)    Atrial fibrillation with rapid ventricular response (HCC) 01/13/2018   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   Asthma 05/09/2017   Primary osteoarthritis of both first carpometacarpal joints 01/31/2016   Arthralgia of both hands 01/31/2016   Ventricular premature depolarization 09/11/2015   Psoriasis 07/16/2015   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   Hyperlipidemia 03/29/2015   Pre-syncope 03/04/2015   Syncope and collapse 03/04/2015   CAD (coronary artery disease)    PVC's (premature  ventricular contractions)    Ischemic cardiomyopathy    DDD (degenerative disc disease), lumbar 01/18/2015   Intervertebral disc disorder with radiculopathy of lumbar region 12/26/2014   L-S radiculopathy 07/03/2014   Breast CA (HCC) 06/28/2014   Arthritis, degenerative 06/28/2014   Unspecified osteoarthritis, unspecified site 06/28/2014   Atherosclerotic heart disease of native coronary artery without angina pectoris 06/28/2014   Acid reflux 03/08/2014   Malignant  neoplasm of female breast (HCC) 03/08/2014   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   PCP:  Bertrum Charlie CROME, MD Pharmacy:   [phone] - [place], [place] - [address] [place] [address] Phone: [phone] Fax: [phone]  [id number] [place], [place] - [address] [place] [address] Phone: [phone] Fax: [phone]  [id number] [place] [facility] - [address] [place] [address] Phone: [phone] Fax: [phone]     Social Drivers of Health (SDOH) Social History: SDOH Screenings   Food Insecurity: No Food Insecurity (06/03/2023)   Received from [facility]  Housing: Unknown (01/14/2024)   Received from [facility]  Transportation Needs: No Transportation Needs (06/03/2023)   Received from [facility]  Utilities: Not At Risk (06/03/2023)   Received from [facility]  Alcohol  Screen: Low Risk  (07/02/2022)  Depression (PHQ2-9): Low Risk  (07/02/2022)  Financial Resource Strain: Low Risk  (06/03/2023)   Received from [facility]  Physical Activity: Insufficiently Active (06/03/2023)   Received from [facility]  Social Connections: Moderately Isolated (07/02/2022)  Stress: No Stress Concern Present (06/03/2023)   Received from [facility]  Tobacco Use: Medium Risk (06/26/2024)  Health Literacy: Adequate Health Literacy (06/03/2023)   Received from [facility]   SDOH Interventions:     Readmission Risk Interventions     No data to display

## 2024-06-28 NOTE — Plan of Care (Signed)
  Problem: Clinical Measurements: Goal: Ability to maintain clinical measurements within normal limits will improve Outcome: Progressing   Problem: Clinical Measurements: Goal: Respiratory complications will improve Outcome: Progressing   Problem: Clinical Measurements: Goal: Cardiovascular complication will be avoided Outcome: Progressing   Problem: Activity: Goal: Risk for activity intolerance will decrease Outcome: Progressing   Problem: Elimination: Goal: Will not experience complications related to urinary retention Outcome: Progressing   Problem: Pain Managment: Goal: General experience of comfort will improve and/or be controlled Outcome: Progressing   Problem: Safety: Goal: Ability to remain free from injury will improve Outcome: Progressing

## 2024-06-28 NOTE — Progress Notes (Signed)
 Physical Therapy Treatment Patient Details Name: Teresa [name] Today's Date: 06/28/2024   History of Present Illness Teresa [name] is a 83 y.o. female with a history of CAD, atrial fibrillation on Eliquis , hyperlipidemia, and GERD who presents with concern for atrial fibrillation over the last 2-3 days.    PT Comments  Pt received upright on BSC agreeable to PT. Pt indep with toileting. Pt progressing gait to ~80' at supervision using RW. Improved foot clearance and step lengths. X1 standing rest breaks needed due to reduced endurance from baseline due to acute illness. Pt returns to recliner with all needs in reach. Discussed with pt and pt's daughter on need for AD at this time to reduce falls risk and benefits of increased supervision and/or follow up recs due to living alone and lack of 24/7 support. Pt's HR remains 60's to 70's BPM throughout session with no reported cardiac symptoms. Pt with all needs in reach with d/c recs remaining appropriate.    If plan is discharge home, recommend the following: A little help with walking and/or transfers;A little help with bathing/dressing/bathroom;Assistance with cooking/housework;Assist for transportation;Help with stairs or ramp for entrance   Can travel by private vehicle        Equipment Recommendations  None recommended by PT    Recommendations for Other Services       Precautions / Restrictions Precautions Precautions: Fall Precaution/Restrictions Comments: monitor HR Restrictions Weight Bearing Restrictions Per Provider Order: No     Mobility  Bed Mobility               General bed mobility comments: NT. found on BSC then placed in recliner post session. Patient Response: Cooperative  Transfers Overall transfer level: Needs assistance Equipment used: Rolling walker (2 wheels) Transfers: Sit to/from Stand Sit to Stand: Supervision                 Ambulation/Gait Ambulation/Gait assistance: Contact guard assist, Supervision Gait Distance (Feet): 80 Feet Assistive device: Rolling walker (2 wheels) Gait Pattern/deviations: Step-through pattern, Decreased step length - right, Decreased step length - left       General Gait Details: progressing gait distances but pt reliant on RW due to limited endurance.   Stairs             Wheelchair Mobility     Tilt Bed Tilt Bed Patient Response: Cooperative  Modified Rankin (Stroke Patients Only)       Balance Overall balance assessment: Needs assistance Sitting-balance support: No upper extremity supported, Feet supported Sitting balance-Leahy Scale: Good       Standing balance-Leahy Scale: Good Standing balance comment: standing without UE support                            Communication Communication Communication: No apparent difficulties  Cognition Arousal: Alert Behavior During Therapy: WFL for tasks assessed/performed   PT - Cognitive impairments: No apparent impairments                         Following commands: Intact      Cueing Cueing Techniques: Verbal cues  Exercises      General Comments General comments (skin integrity, edema, etc.): HR 60's-70 BPM with mobility this date.      Pertinent Vitals/Pain Pain Assessment Pain Assessment: No/denies pain    Home Living  Prior Function            PT Goals (current goals can now be found in the care plan section) Acute Rehab PT Goals Patient Stated Goal: to feel better PT Goal Formulation: With patient Time For Goal Achievement: 07/11/24 Potential to Achieve Goals: Good Progress towards PT goals: Progressing toward goals    Frequency    Min 3X/week      PT Plan      Co-evaluation              AM-PAC PT 6 Clicks Mobility   Outcome Measure  Help needed turning from your back to your side while in a flat bed  without using bedrails?: A Little Help needed moving from lying on your back to sitting on the side of a flat bed without using bedrails?: A Little Help needed moving to and from a bed to a chair (including a wheelchair)?: A Little Help needed standing up from a chair using your arms (e.g., wheelchair or bedside chair)?: A Little Help needed to walk in hospital room?: A Little Help needed climbing 3-5 steps with a railing? : A Little 6 Click Score: 18    End of Session Equipment Utilized During Treatment: Gait belt Activity Tolerance: Patient tolerated treatment well Patient left: in chair;with call bell/phone within reach;with chair alarm set Nurse Communication: Mobility status PT Visit Diagnosis: Difficulty in walking, not elsewhere classified (R26.2);History of falling (Z91.81);Muscle weakness (generalized) (M62.81)     Time: 8480-8467 PT Time Calculation (min) (ACUTE ONLY): 13 min  Charges:    $Therapeutic Activity: 8-22 mins PT General Charges $$ ACUTE PT VISIT: 1 Visit                     Dorina HERO. Fairly IV, PT, DPT Physical Therapist- [place]  [facility] 06/28/2024, 4:02 PM

## 2024-06-28 NOTE — Progress Notes (Signed)
 Heart Failure Navigator Progress Note  Assessed for Heart & Vascular TOC clinic readiness.  Patient does not meet criteria due to current [facility] patient.   Navigator will sign off at this time.  Roxy Horseman, RN, BSN [facility] Heart Failure Navigator Secure Chat Only

## 2024-06-28 NOTE — Progress Notes (Signed)
 Patient in A-fib with RVR despite receiving her Toprol -XL earlier today, so she was resumed on Cardizem  drip. Brayton Lye MD

## 2024-06-28 NOTE — Progress Notes (Addendum)
 [facility] CLINIC CARDIOLOGY PROGRESS NOTE       Patient ID: FRANCEE SETZER MRN: [id number] DOB/AGE: 01-06-1941 83 y.o.  Admit date: 06/26/2024 Referring Physician Dr. Sherlon Primary Physician Bertrum Charlie CROME, MD Primary Cardiologist [facility]) Reason for Consultation AF RVR  HPI: CORRENE LALANI is a 83 y.o. female  with a past medical history of Alzheimer's dementia, paroxsymal atrial fibrillation s/p PVI (05/2019) s/p DCCV (07/2023), ischemic cardiomyopathy, chronic HFrEF (35%), coronary artery disease s/p CABG (1994), hypertension, hyperlipidemia, COPD who presented to the ED on 06/26/2024 for palpitations, weakness and fatigue for past 3 days. EKG in ED with AF RVR rate 130s. Cardiology was consulted for further evaluation.   Interval History: -Patient seen and examined this AM and laying comfortably in hospital bed. Patient states she feels okay and denies chest pain, palpitations or SOB.  -Patients BP borderline, improving and HR elevated 90-100s in AF this AM. Overnight Tele showed no significant events.  -Patient remains on room air with stable SpO2.  -Discussed patient's atrial fibrillation with EP. Recommended outpatient follow-up and possible ablation.   Review of systems complete and found to be negative unless listed above    Past Medical History:  Diagnosis Date   Arthritis    Asthma    Back pain    Breast cancer, left (HCC) 2009   CAD (coronary artery disease)    a. 1994 s/p CABG x 1 (LIMA->LAD); b. 03/2015 MV: No ischemia; c. MV 11/18: small fixed apical defect likely secondary to breast attenuation, EF of 42%, frequent PVCs; d. 12/2017 Cath: LM nl, LAD 20p, D1/2/3 nl, LCX min irregs, OM1/2/3 min irregs, RCA nl, RPDA nl, RPL1/2 nl, LIMA->LAD atretic.   Chronic anticoagulation    Apixaban    Chronic combined systolic (congestive) and diastolic (congestive) heart failure (HCC)    a. 2013 EF 40%;  b. 03/2015 Echo: EF 55-60%; c. 12/18 Echo: EF of  35-40%; d. 12/2017 TEE: EF 35-40%; e. 03/2018 Echo: EF 40-45%; f. 09/2018 Echo: EF 35%.   DDD (degenerative disc disease), lumbar    Dental crowns present    caps- left back top, right back bottom   Gastroesophageal reflux disease    Hyperlipidemia    Hypertension    LBBB (left bundle branch block)    MI (myocardial infarction) (HCC) 1994   Mixed Ischemic & Nonischemic cardiomyopathy    a. 2013 EF 40%;  b. 03/2015 Echo: EF 55-60%; c. 12/18 Echo: EF of 35-40%, hypokinesis of the anteroseptal, and apical myocardium, mild to mod MR, mod dil LA, nl RV fxn, PASP 53 mmHg; d. 12/2017 TEE: EF 35-40%, diff HK, mild to mod MR. small PFO. No LAA/RAA thrombus; e. 03/2018 Echo: EF 40-45%, antsept/inf HK, Gr2 DD, mild MR, mod idl LA, mild to mod TR, PASP 35-40mmHg; f. 09/2018 Echo: EF 35%.   Osteoarthritis    left hip and knee   Persistent atrial fibrillation (HCC)    a. diagnosed 01/13/2018; b. CHADS2VASc = 6 --> Eliquis ; c. 12/2017 s/p TEE/DCCV. Amio started but d/c'd 01/2018 2/2 n/anorexia; d. 10/2018 DCCV-->recurrent Afib w/in days; e. 10/2018 DCCV x 2 in ED->persistent Afib.   Personal history of radiation therapy    PONV (postoperative nausea and vomiting)    Psoriasis    PSVT (paroxysmal supraventricular tachycardia) (HCC)    a. 02/2015 Holter: short runs of SVT and NSVT.   Pulmonary hypertension (HCC)    PVC's (premature ventricular contractions)    a. 03/2018 24h Holter: Freq PVC's with  a total of 421 beats in 24 hrs. 7 short runs of SVT likely representing Afib.   Rheumatoid arthritis (HCC)    feet, hands   Vertigo    approx 2x/yr    Past Surgical History:  Procedure Laterality Date   ABDOMINAL HYSTERECTOMY     APPENDECTOMY     BREAST BIOPSY Right 1991   negative   BREAST EXCISIONAL BIOPSY Left 2009   positive   BREAST LUMPECTOMY Left 2009   CARDIAC CATHETERIZATION N/A 05/27/1988   Location: [facility]; Surgeon: Miquel Ellen, MD   CARDIOVERSION N/A 01/15/2018   Procedure: CARDIOVERSION;   Surgeon: Darron Deatrice LABOR, MD;  Location: [facility] ORS;  Service: Cardiovascular;  Laterality: N/A;   CARDIOVERSION N/A 10/29/2018   Procedure: CARDIOVERSION;  Surgeon: Perla Evalene PARAS, MD;  Location: [facility] ORS;  Service: Cardiovascular;  Laterality: N/A;   CARDIOVERSION N/A 11/11/2018   DCCV x 2 attempts at 120 J; Location: [facility] ED   CERVICAL FUSION  2019   CHOLECYSTECTOMY     COLONOSCOPY     CORONARY ARTERY BYPASS GRAFT  04/11/1993   1v; LIMA-LAD; Location: [facility]; Surgeon: Lynwood Berg, MD   FOOT ARTHRODESIS Right 07/24/2016   Procedure: FUSION FIRST METATARSAL CUNEIFORM JOINT RIGHT FOOT, FUSION SECOND METATARSAL CUNEFORM JOINT BUNION REPAIR RIGHT FOOT;  Surgeon: Donnice Cory, DPM;  Location: [facility];  Service: Podiatry;  Laterality: Right;   HARDWARE REMOVAL Right 07/24/2016   Procedure: REMOVAL HARDWARE LATERAL MALLEOUS RIGHT ANKLE;  Surgeon: Donnice Cory, DPM;  Location: [facility];  Service: Podiatry;  Laterality: Right;  REMOVAL OF PIN WHICH WAS INTACT   HERNIA REPAIR     ventral   KNEE ARTHROSCOPY Left    LUMBAR FUSION     Pulmonary vein isolation (cardiac ablation) N/A 05/27/2019   Location: [facility]; Surgeon: Lamar Kerns, MD   RIGHT/LEFT HEART CATH AND CORONARY/GRAFT ANGIOGRAPHY N/A 01/14/2018   Procedure: LEFT HEART CATH AND CORONARY ANGIOGRAPHY;  Surgeon: Darron Deatrice LABOR, MD;  Location: [facility] INVASIVE CV LAB;  Service: Cardiovascular;  Laterality: N/A;   TEE WITHOUT CARDIOVERSION N/A 01/15/2018   Procedure: TRANSESOPHAGEAL ECHOCARDIOGRAM (TEE);  Surgeon: Darron Deatrice LABOR, MD;  Location: [facility];  Service: Cardiovascular;  Laterality: N/A;   TONSILLECTOMY     TOTAL HIP ARTHROPLASTY Left    TOTAL HIP ARTHROPLASTY Right 07/11/2021   Procedure: TOTAL HIP ARTHROPLASTY ANTERIOR APPROACH;  Surgeon: Kathlynn Sharper, MD;  Location: [facility] OR[facility];  Service: Orthopedics;  Laterality: Right;   TOTAL KNEE ARTHROPLASTY Left 12/19/2013   TUBAL LIGATION      Medications  Prior to Admission  Medication Sig Dispense Refill Last Dose/Taking   apixaban  (ELIQUIS ) 5 MG TABS tablet Take 1 tablet (5 mg total) by mouth 2 (two) times daily. 180 tablet 3 06/25/2024   cetirizine  (ZYRTEC ) 10 MG tablet TAKE 1 TABLET BY MOUTH EVERY DAY 90 tablet 3 Unknown   donepezil  (ARICEPT ) 5 MG tablet TAKE 1 TABLET BY MOUTH EVERYDAY AT BEDTIME 90 tablet 1 06/25/2024   fluticasone  (FLONASE ) 50 MCG/ACT nasal spray PLACE 2 SPRAYS INTO BOTH NOSTRILS DAILY AS NEEDED FOR ALLERGIES. 48 mL 4 Unknown   gabapentin  (NEURONTIN ) 300 MG capsule 1 CAPSULE IN THE MORNING, 2 CAPSULES IN THE EVENING 270 capsule 0 06/25/2024   memantine  (NAMENDA ) 10 MG tablet Take 10 mg by mouth in the morning and at bedtime.   06/25/2024   metoprolol  succinate (TOPROL -XL) 25 MG 24 hr tablet Take 12.5 mg by mouth daily as needed (ONLY IF HEART RATE IS ABOVE 50  BEATS PER MINUTE).   Unknown   Multiple Vitamin (MULTIVITAMIN WITH MINERALS) TABS tablet Take 1 tablet by mouth daily.   06/25/2024   OTEZLA  30 MG TABS Take 30 mg by mouth in the morning and at bedtime.   06/25/2024   rosuvastatin  (CRESTOR ) 5 MG tablet TAKE 1 TABLET BY MOUTH EVERY DAY 90 tablet 3 06/25/2024   sacubitril -valsartan  (ENTRESTO ) 24-26 MG Take 1 tablet by mouth 2 (two) times daily.   06/25/2024   vitamin B-12 (CYANOCOBALAMIN ) 1000 MCG tablet Take 1,000 mcg by mouth daily.   06/25/2024   Social History   Socioeconomic History   Marital status: Divorced    Spouse name: Not on file   Number of children: 2   Years of education: Not on file   Highest education level: 11th grade  Occupational History   Occupation: retired  Tobacco Use   Smoking status: Former    Current packs/day: 0.00    Average packs/day: 0.5 packs/day for 35.0 years (17.5 ttl pk-yrs)    Types: Cigarettes    Start date: 58    Quit date: 1991    Years since quitting: 34.5   Smokeless tobacco: Never   Tobacco comments:    Quit approx 1990  Vaping Use   Vaping status: Never Used  Substance and  Sexual Activity   Alcohol  use: No   Drug use: No   Sexual activity: Not on file  Other Topics Concern   Not on file  Social History Narrative   Not on file   Social Drivers of Health   Financial Resource Strain: Low Risk  (06/03/2023)   Received from [facility]   Overall Financial Resource Strain (CARDIA)    Difficulty of Paying Living Expenses: Not hard at all  Food Insecurity: No Food Insecurity (06/03/2023)   Received from [facility]   Hunger Vital Sign    Within the past 12 months, you worried that your food would run out before you got the money to buy more.: Never true    Within the past 12 months, the food you bought just didn't last and you didn't have money to get more.: Never true  Transportation Needs: No Transportation Needs (06/03/2023)   Received from [facility] - Transportation    In the past 12 months, has lack of transportation kept you from medical appointments or from getting medications?: No    Lack of Transportation (Non-Medical): No  Physical Activity: Insufficiently Active (06/03/2023)   Received from [facility]   Exercise Vital Sign    On average, how many days per week do you engage in moderate to strenuous exercise (like a brisk walk)?: 3 days    On average, how many minutes do you engage in exercise at this level?: 20 min  Stress: No Stress Concern Present (06/03/2023)   Received from [facility] - Occupational Stress Questionnaire    Feeling of Stress : Only a little  Social Connections: Moderately Isolated (07/02/2022)   Social Connection and Isolation Panel    Frequency of Communication with Friends and Family: More than three times a week    Frequency of Social Gatherings with Friends and Family: Once a week    Attends Religious Services: More than 4 times per year    Active Member of [employer] or Organizations:  No    Attends Banker Meetings: Never  Marital Status: Divorced  Catering manager Violence: Not At Risk (07/02/2022)   Humiliation, Afraid, Rape, and Kick questionnaire    Fear of Current or Ex-Partner: No    Emotionally Abused: No    Physically Abused: No    Sexually Abused: No    Family History  Problem Relation Age of Onset   Heart attack Father    Congestive Heart Failure Mother    Breast cancer Mother 80   Kidney failure Sister    Prostate cancer Brother    Bladder Cancer Brother    Lung cancer Sister    Arthritis Sister    Breast cancer Sister    Arthritis Sister    Breast cancer Sister    Lung cancer Sister    Lung cancer Brother    Arthritis Brother      Vitals:   06/28/24 0900 06/28/24 1000 06/28/24 1100 06/28/24 1111  BP:    136/79  Pulse: (!) 39 67 66 87  Resp: 17 15 14 18   Temp:    99 F (37.2 C)  TempSrc:    Oral  SpO2: 98% 95% 95% 98%  Weight:      Height:        PHYSICAL EXAM General: Chronically ill-appearing elderly female, well nourished, in no acute distress. HEENT: Normocephalic and atraumatic. Neck: No JVD.   Lungs: Normal respiratory effort on air. Clear bilaterally to auscultation. No wheezes, crackles, rhonchi.  Heart: Irregularly irregular, elevated rates. Normal S1 and S2 without gallops or murmurs.  Abdomen: Non-distended appearing.  Msk: Normal strength and tone for age. Extremities: Warm and well perfused. No clubbing, cyanosis, edema.  Neuro: Alert and oriented X 3. Psych: Answers questions appropriately, slow to respond.  Labs: Basic Metabolic Panel: Recent Labs    06/26/24 1424 06/26/24 1624 06/27/24 1952  NA 141  --  140  K 4.1  --  4.1  CL 109  --  108  CO2 23  --  18*  GLUCOSE 101*  --  105*  BUN 16  --  16  CREATININE 0.89  --  0.53  CALCIUM  9.6  --  9.0  MG  --  2.1 2.2   Liver Function Tests: No results for input(s): AST, ALT, ALKPHOS, BILITOT, PROT, ALBUMIN in the last 72 hours. No  results for input(s): LIPASE, AMYLASE in the last 72 hours. CBC: Recent Labs    06/26/24 1424 06/27/24 1750  WBC 5.7 7.3  HGB 14.9 14.4  HCT 46.2* 44.3  MCV 94.7 94.5  PLT 208 189   Cardiac Enzymes: Recent Labs    06/26/24 1424 06/26/24 1624  TROPONINIHS 30* 35*   BNP: Recent Labs    06/27/24 1750  BNP 983.4*   D-Dimer: No results for input(s): DDIMER in the last 72 hours. Hemoglobin A1C: No results for input(s): HGBA1C in the last 72 hours. Fasting Lipid Panel: No results for input(s): CHOL, HDL, LDLCALC, TRIG, CHOLHDL, LDLDIRECT in the last 72 hours. Thyroid  Function Tests: Recent Labs    06/26/24 1424  TSH 1.463   Anemia Panel: No results for input(s): VITAMINB12, FOLATE, FERRITIN, TIBC, IRON, RETICCTPCT in the last 72 hours.   Radiology: ECHOCARDIOGRAM COMPLETE Result Date: 06/27/2024    ECHOCARDIOGRAM REPORT   Patient Name:   ARLISS HEPBURN [facility] Date of Exam: 06/27/2024 Medical Rec #:  [id number]       Height:       64.0 in Accession #:    [id number]      Weight:  154.7 lb Date of Birth:  Jun 16, 1941       BSA:          1.754 m Patient Age:    82 years        BP:           107/47 mmHg Patient Gender: F               HR:           100 bpm. Exam Location:  [facility] Procedure: 2D Echo, Cardiac Doppler, Color Doppler and Intracardiac            Opacification Agent (Both Spectral and Color Flow Doppler were            utilized during procedure). Indications:     Atrial Fibrillation I48.91  History:         Patient has prior history of Echocardiogram examinations, most                  recent 04/07/2018. Arrythmias:Atrial Fibrillation.  Sonographer:     Ashley McNeely-Sloane Referring Phys:  [id number] DAWOOD GORMAN LYE Diagnosing Phys: Cara JONETTA Lovelace MD IMPRESSIONS  1. Left ventricular ejection fraction, by estimation, is 40 to 45%. The left ventricle has mildly decreased function. The left ventricle demonstrates global hypokinesis. There is mild  concentric left ventricular hypertrophy. Left ventricular diastolic parameters are consistent with Grade I diastolic dysfunction (impaired relaxation).  2. Right ventricular systolic function is normal. The right ventricular size is normal.  3. Left atrial size was moderately dilated.  4. Right atrial size was mildly dilated.  5. The mitral valve is normal in structure. Trivial mitral valve regurgitation.  6. The aortic valve is normal in structure. Aortic valve regurgitation is not visualized. FINDINGS  Left Ventricle: Left ventricular ejection fraction, by estimation, is 40 to 45%. The left ventricle has mildly decreased function. The left ventricle demonstrates global hypokinesis. Definity  contrast agent was given IV to delineate the left ventricular  endocardial borders. Strain was performed and the global longitudinal strain is indeterminate. Global longitudinal strain performed but not reported based on interpreter judgement due to suboptimal tracking. The left ventricular internal cavity size was  normal in size. There is mild concentric left ventricular hypertrophy. Left ventricular diastolic parameters are consistent with Grade I diastolic dysfunction (impaired relaxation). Right Ventricle: The right ventricular size is normal. No increase in right ventricular wall thickness. Right ventricular systolic function is normal. Left Atrium: Left atrial size was moderately dilated. Right Atrium: Right atrial size was mildly dilated. Pericardium: There is no evidence of pericardial effusion. Mitral Valve: The mitral valve is normal in structure. Trivial mitral valve regurgitation. MV peak gradient, 5.8 mmHg. The mean mitral valve gradient is 3.0 mmHg. Tricuspid Valve: The tricuspid valve is normal in structure. Tricuspid valve regurgitation is mild. Aortic Valve: The aortic valve is normal in structure. Aortic valve regurgitation is not visualized. Aortic valve mean gradient measures 3.0 mmHg. Aortic valve peak  gradient measures 5.6 mmHg. Aortic valve area, by VTI measures 1.84 cm. Pulmonic Valve: The pulmonic valve was normal in structure. Pulmonic valve regurgitation is not visualized. Aorta: The ascending aorta was not well visualized. IAS/Shunts: No atrial level shunt detected by color flow Doppler. Additional Comments: 3D was performed not requiring image post processing on an independent workstation and was indeterminate.  LEFT VENTRICLE PLAX 2D LVIDd:         4.50 cm     Diastology LVIDs:  4.10 cm     LV e' medial:    8.85 cm/s LV PW:         1.30 cm     LV E/e' medial:  11.2 LV IVS:        1.20 cm     LV e' lateral:   12.20 cm/s LVOT diam:     2.00 cm     LV E/e' lateral: 8.1 LV SV:         41 LV SV Index:   24 LVOT Area:     3.14 cm  LV Volumes (MOD) LV vol d, MOD A2C: 32.3 ml LV vol d, MOD A4C: 97.0 ml LV vol s, MOD A2C: 19.2 ml LV vol s, MOD A4C: 58.5 ml LV SV MOD A2C:     13.1 ml LV SV MOD A4C:     97.0 ml LV SV MOD BP:      24.0 ml RIGHT VENTRICLE            IVC RV Basal diam:  4.30 cm    IVC diam: 2.60 cm RV Mid diam:    3.10 cm RV S prime:     9.90 cm/s TAPSE (M-mode): 1.4 cm LEFT ATRIUM             Index        RIGHT ATRIUM           Index LA diam:        4.90 cm 2.79 cm/m   RA Area:     12.70 cm LA Vol (A2C):   49.5 ml 28.22 ml/m  RA Volume:   27.20 ml  15.51 ml/m LA Vol (A4C):   43.9 ml 25.03 ml/m LA Biplane Vol: 49.8 ml 28.39 ml/m  AORTIC VALVE                    PULMONIC VALVE AV Area (Vmax):    2.01 cm     PV Vmax:        1.47 m/s AV Area (Vmean):   1.89 cm     PV Vmean:       103.000 cm/s AV Area (VTI):     1.84 cm     PV VTI:         0.279 m AV Vmax:           118.00 cm/s  PV Peak grad:   8.6 mmHg AV Vmean:          79.100 cm/s  PV Mean grad:   5.0 mmHg AV VTI:            0.225 m      RVOT Peak grad: 3 mmHg AV Peak Grad:      5.6 mmHg AV Mean Grad:      3.0 mmHg LVOT Vmax:         75.50 cm/s LVOT Vmean:        47.700 cm/s LVOT VTI:          0.132 m LVOT/AV VTI ratio: 0.59  AORTA Ao  Root diam: 2.60 cm Ao Asc diam:  2.80 cm MITRAL VALVE               TRICUSPID VALVE MV Area (PHT): 3.37 cm    TR Peak grad:   26.6 mmHg MV Area VTI:   1.95 cm    TR Mean grad:   19.0 mmHg MV Peak grad:  5.8 mmHg    TR Vmax:  258.00 cm/s MV Mean grad:  3.0 mmHg    TR Vmean:       210.0 cm/s MV Vmax:       1.20 m/s MV Vmean:      82.4 cm/s   SHUNTS MV Decel Time: 225 msec    Systemic VTI:  0.13 m MV E velocity: 99.20 cm/s  Systemic Diam: 2.00 cm                            Pulmonic VTI:  0.142 m Cara JONETTA Lovelace MD Electronically signed by Cara JONETTA Lovelace MD Signature Date/Time: 06/27/2024/5:02:16 PM    Final    DG Chest Port 1 View Result Date: 06/26/2024 CLINICAL DATA:  [id number] A-fib [facility]) [id number] EXAM: PORTABLE CHEST 1 VIEW COMPARISON:  Chest x-ray 07/20/2023 FINDINGS: The heart and mediastinal contours are unchanged. Atherosclerotic plaque. No focal consolidation. Mild pulmonary edema. No pleural effusion. No pneumothorax. No acute osseous abnormality.  Sternotomy wires are intact. IMPRESSION: 1. Mild pulmonary edema. 2.  Aortic Atherosclerosis (ICD10-I70.0). Electronically Signed   By: Morgane  Naveau M.D.   On: 06/26/2024 16:09    ECHO as above.  TELEMETRY reviewed by me 06/28/2024: Atrial fibrillation, rate 90-100s  EKG reviewed by me: atrial fibrillation, LBBB rate 133 bpm  Data reviewed by me 06/28/2024: last 24h vitals tele labs imaging I/O hospitalist progress notes.  Principal Problem:   Atrial fibrillation with RVR (HCC) Active Problems:   S/P CABG (coronary artery bypass graft)   Atrial fibrillation with rapid ventricular response (HCC)   Psoriatic arthritis (HCC)   Rheumatoid arthritis (HCC)    ASSESSMENT AND PLAN:  SHERINE CORTESE is a 83 y.o. female  with a past medical history of paroxsymal atrial fibrillation s/p PVI (05/2019) s/p DCCV (07/2023), ischemic cardiomyopathy, chronic HFrEF (35%), coronary artery disease s/p CABG (1994), hypertension, hyperlipidemia, COPD who  presented to the ED on 06/26/2024 for palpitations, weakness and fatigue for past 3 days. EKG in ED with AF RVR rate 130s. Cardiology was consulted for further evaluation.   # Atrial fibrillation RVR  # Paroxsymal AF s/p PVI (2020) s/p DCCV (07/2023) Patient presents with weakness, fatigue.  Patient denies any palpitations.  EKG in ED with AF RVR rate 130s.  Heart rate improved s/p Cardizem  drip, d/c due to low EF.  Patient with known history of AF s/p PVI and DCCV. Discussions outpatient about redoing PVI due to recurrence of AF. Patient has documented allergy to Amiodarone . Per tele atrial fibrillation with rates 80s to 90s. -Monitor and replenish electrolytes for a goal K >4, Mag >2  -Continue home Eliquis  5 mg BID for stroke risk reduction. -Continue metoprolol  tartrate 50 mg BID. Will uptitrate as BP allows.  -Ordered 1x IV digoxin  0.25 mg for better rate control is setting of borderline BP.      -Check digoxin  level today (If > 1, will start PO 0.0625 mg daily tomorrow; < 0.8 will repeat IV dose 0.25 mg). --- discussed dosing with pharmacy.  -Recommend avoiding Cardizem  due to patient's reduced EF.  -Patient has documented allergy to amiodarone .  Will optimize rate control and defer using rhythm management at this time. -Discussed patient's atrial fibrillation with EP (07/08).  EP does not recommend starting dofetilide at this time as this needs to be done at another hospital for closer monitoring of QTc.  Recommended outpatient follow-up for ablation.  # Ischemic Cardiomyopathy  # Chronic HFrEF Patient without SOB, LEE.  Patient appears near euvolemic on exam. CXR with mild pulmonary edema in setting of AF RVR. Echo this admission with reduced EF 40-45%, global hypokinesis, mild concentric LVH, trivial MR. -Continue metoprolol  as stated above.  -Continue losartan  25 mg daily for now. Will plan to resume home Entresto  24-26 mg BID as BP allows.  -Continue dapagliflozin  10 mg daily. (Copay  $0) -Plan to start low dose spironolactone  tomorrow. -Will plan to optimize GDMT as BP and renal function allow.  # Coronary artery disease s/p CABG (1994) # Hypertension # Hyperlipidemia  Patient without chest pain. Trops 30 > 35. EKG in ED with atrial fibrillation, LBBB rate 133 bpm (known LBBB).  -Continue home rosuvastatin  5 mg daily.  -Continue metoprolol , losartan  as stated above. -Minimally elevated and flat trops in setting of AF RVR is most consistent with demand/supply mismatch and not ACS   This patient's plan of care was discussed and created with Dr. Florencio and he is in agreement.  Signed: Dorene Comfort, PA-C  06/28/2024, 11:36 AM [facility]

## 2024-06-29 DIAGNOSIS — I4891 Unspecified atrial fibrillation: Secondary | ICD-10-CM | POA: Diagnosis not present

## 2024-06-29 LAB — BASIC METABOLIC PANEL WITH GFR
Anion gap: 8 (ref 5–15)
BUN: 16 mg/dL (ref 8–23)
CO2: 23 mmol/L (ref 22–32)
Calcium: 8.9 mg/dL (ref 8.9–10.3)
Chloride: 108 mmol/L (ref 98–111)
Creatinine, Ser: 0.72 mg/dL (ref 0.44–1.00)
GFR, Estimated: >60 mL/min (ref >60)
Glucose, Bld: 78 mg/dL (ref 70–99)
Potassium: 3.6 mmol/L (ref 3.5–5.1)
Sodium: 139 mmol/L (ref 135–145)

## 2024-06-29 LAB — MAGNESIUM: Magnesium: 2.1 mg/dL (ref 1.7–2.4)

## 2024-06-29 MED ORDER — POTASSIUM CHLORIDE CRYS ER 20 MEQ PO TBCR
40.0000 meq | EXTENDED_RELEASE_TABLET | Freq: Once | ORAL | Status: AC
  Administered 2024-06-29: 40 meq via ORAL
  Filled 2024-06-29: qty 2

## 2024-06-29 MED ORDER — SPIRONOLACTONE 25 MG PO TABS
25.0000 mg | ORAL_TABLET | Freq: Every day | ORAL | Status: DC
  Administered 2024-06-29 – 2024-06-30 (×2): 25 mg via ORAL
  Filled 2024-06-29 (×2): qty 1

## 2024-06-29 NOTE — Plan of Care (Signed)

## 2024-06-29 NOTE — Progress Notes (Signed)
  PROGRESS NOTE    Teresa [name]  FMW:[id number] DOB: 1941/11/11 DOA: 06/26/2024 PCP: Bertrum Charlie CROME, MD  [id number]  LOS: 2 days   Brief hospital course:   Assessment & Plan: Teresa [name]  is a 83 y.o. female,with ICM, LVEF (35-40%), CAD s/p CABG 1994 with LIMA-LAD, HTN, Hyperlipidemia, AF s/p PVI who presented to ED secondary to complaints of palpitation, weakness and fatigue over the last 2 to 3 days, patient reports some history of heart racing/palpitation  In ED patient was noted to have heart rate 148 on presentation, A-fib with RVR, she was started on Cardizem  drip    A-fib with RVR -Heart rate in the 140s upon presentation, she reports palpitation and fatigue, she does report compliance with medications, no clear why she went into with RVR. - She was started on Cardizem  drip, and home dose Toprol  increased from 12.5 mg to 25 mg.  Cardio consulted on admission. --weaned off dilt gtt overnight, however, HR went up again in next evening.  Started on digoxin  with IV load. --cont lopressor  50 mg BID --cont oral digoxin  0.0625 mg daily --avoid dilt due to reduced EF, avoid amiodarone  due to allergy. 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  Current Echo with improved LVEF 40-45%. --s/p IV lasix  40 x1 --cont losartan  --cont Farxiga  --start spironolactone  25 mg daily --cardio plans to optimize GDMT as BP and renal function allow.    Elevated troponins - Her troponins are borderline elevated, this is likely in setting of demand ischemia from her A-fib with RVR   Hyperlipidemia - Continue with statin   COPD --stable   Rheumatoid arthritis/Psoriatic arthritis  arthritis - Resume Otezla  after discharge   History of dementia - cont home Aricept  and Namenda    DVT prophylaxis: On:Eliquis  Code Status: Full code  Family Communication: daughter updated on the  phone today Level of care: Telemetry Medical Dispo:   The patient is from: home Anticipated d/c is to: home Anticipated d/c date is: likely tomorrow   Subjective and Interval History:  Pt reported feeling better.  HR has been controlled.   Objective: Vitals:   06/28/24 2000 06/29/24 0000 06/29/24 0400 06/29/24 1100  BP: 131/74 103/62 (!) 119/55   Pulse: 83 85 73   Resp: 17 16 13    Temp: 97.9 F (36.6 C) 97.8 F (36.6 C) 97.9 F (36.6 C) 98.3 F (36.8 C)  TempSrc: Oral Oral Oral Oral  SpO2: 96% 96% 96%   Weight:      Height:        Intake/Output Summary (Last 24 hours) at 06/29/2024 1655 Last data filed at 06/29/2024 0450 Gross per 24 hour  Intake 240 ml  Output 950 ml  Net -710 ml   Filed Weights   06/26/24 1415  Weight: 70.2 kg    Examination:   Constitutional: NAD, alert, oriented to person and place HEENT: conjunctivae and lids normal, EOMI CV: No cyanosis.   RESP: normal respiratory effort, on RA Neuro: II - XII grossly intact.   Psych: Normal mood and affect.  Appropriate judgement and reason   Data Reviewed: I have personally reviewed labs and imaging studies  Time spent: 35 minutes  Ellouise Haber, MD [facility] If 7PM-7AM, please contact night-coverage 06/29/2024, 4:55 PM

## 2024-06-29 NOTE — Plan of Care (Signed)
  Problem: Education: Goal: Knowledge of General Education information will improve Description: Including pain rating scale, medication(s)/side effects and non-pharmacologic comfort measures Outcome: Progressing   Problem: Clinical Measurements: Goal: Respiratory complications will improve Outcome: Progressing   Problem: Clinical Measurements: Goal: Cardiovascular complication will be avoided Outcome: Progressing   Problem: Activity: Goal: Risk for activity intolerance will decrease Outcome: Progressing   Problem: Coping: Goal: Level of anxiety will decrease Outcome: Progressing   Problem: Pain Managment: Goal: General experience of comfort will improve and/or be controlled Outcome: Progressing

## 2024-06-29 NOTE — Progress Notes (Signed)
 Physical Therapy Treatment Patient Details Name: Teresa [name] MRN: [id number] DOB: [birth year] Today's Date: 06/29/2024   History of Present Illness Teresa [name] is a 83 y.o. female with a history of CAD, atrial fibrillation on Eliquis , hyperlipidemia, and GERD who presents with concern for atrial fibrillation.    PT Comments  Pt pleasant and showed good effort but ultimately was limited with how much ambulation she was able to tolerate.  Pt with elevated HR with minimal effort (110s after just a few feet of walking) and had spikes up into the 140s on both bouts of ambulation.  Pt feeling weak, veering slightly to the R (needing physical assist) and needing to sit after 30 ft and later 20 ft of ambulation.  Pt was unable to walk as far today as prior PT sessions and she did not look (and subjectively reports no feeling) very steady or confident this date.  Pt will benefit from continued PT to address functional limitations.    If plan is discharge home, recommend the following: A little help with walking and/or transfers;A little help with bathing/dressing/bathroom;Assistance with cooking/housework;Assist for transportation;Help with stairs or ramp for entrance   Can travel by private vehicle        Equipment Recommendations  None recommended by PT    Recommendations for Other Services       Precautions / Restrictions Precautions Precautions: Fall Restrictions Weight Bearing Restrictions Per Provider Order: No     Mobility  Bed Mobility Overal bed mobility: Needs Assistance Bed Mobility: Supine to Sit     Supine to sit: Contact guard     General bed mobility comments: Pt highly reliant on rails and needed extra time/cuing, but able to get to sitting EOB w/o direct assist    Transfers Overall transfer level: Needs assistance Equipment used: Rolling walker (2 wheels) Transfers: Sit to/from Stand Sit to Stand: Supervision           General transfer comment: cues for  appropriate set up and sequencing, able to rise multiple times from multiple surfaces with the walker    Ambulation/Gait Ambulation/Gait assistance: Min assist Gait Distance (Feet): 30 Feet Assistive device: Rolling walker (2 wheels)         General Gait Details: 30 ft then 20 after seated rest break... pt showed great effort but was quick to fatigue.  SpO2 remained in the high 90s but HR was labile reaching 140s briefly on each bout and generally staying in the 110-120 range.  Pt with some light headedness and staggering to the R nearing the end of both bouts of ambulation needing light assist to remain safe.   Stairs             Wheelchair Mobility     Tilt Bed    Modified Rankin (Stroke Patients Only)       Balance Overall balance assessment: Needs assistance Sitting-balance support: Single extremity supported Sitting balance-Leahy Scale: Good     Standing balance support: Bilateral upper extremity supported Standing balance-Leahy Scale: Fair Standing balance comment: Pt more reliant on UEs for stability today and with fatigue became very reliant on the walker                            Communication Communication Communication: No apparent difficulties  Cognition Arousal: Alert Behavior During Therapy: WFL for tasks assessed/performed   PT - Cognitive impairments: No apparent impairments  Following commands: Intact      Cueing Cueing Techniques: Verbal cues  Exercises      General Comments General comments (skin integrity, edema, etc.): Pt with resting HR in the 80s, but quickly up to 110s with minimal ambulation and reaching 140s on both brief bouts.  (nursing notified) - reported some light headedness with each ambulation effort, BP was not low when checked upon return to sitting.      Pertinent Vitals/Pain Pain Assessment Pain Assessment: No/denies pain    Home Living                           Prior Function            PT Goals (current goals can now be found in the care plan section) Progress towards PT goals: Progressing toward goals    Frequency    Min 2X/week      PT Plan      Co-evaluation              AM-PAC PT 6 Clicks Mobility   Outcome Measure  Help needed turning from your back to your side while in a flat bed without using bedrails?: A Little Help needed moving from lying on your back to sitting on the side of a flat bed without using bedrails?: A Little Help needed moving to and from a bed to a chair (including a wheelchair)?: A Little Help needed standing up from a chair using your arms (e.g., wheelchair or bedside chair)?: A Little Help needed to walk in hospital room?: A Little Help needed climbing 3-5 steps with a railing? : A Little 6 Click Score: 18    End of Session Equipment Utilized During Treatment: Gait belt Activity Tolerance: Patient tolerated treatment well Patient left: in chair;with call bell/phone within reach;with chair alarm set Nurse Communication: Mobility status PT Visit Diagnosis: Difficulty in walking, not elsewhere classified (R26.2);History of falling (Z91.81);Muscle weakness (generalized) (M62.81)     Time: 8394-8367 PT Time Calculation (min) (ACUTE ONLY): 27 min  Charges:    $Gait Training: 8-22 mins $Therapeutic Activity: 8-22 mins PT General Charges $$ ACUTE PT VISIT: 1 Visit                     Carmin JONELLE Deed, DPT 06/29/2024, 5:16 PM

## 2024-06-29 NOTE — Progress Notes (Signed)
 [facility] CLINIC CARDIOLOGY PROGRESS NOTE       Patient ID: Teresa [name] MRN: [id number] DOB/AGE: 04-01-1941 82 y.o.  Admit date: 06/26/2024 Referring Physician Dr. Sherlon Primary Physician Bertrum Charlie CROME, MD Primary Cardiologist [facility]) Reason for Consultation AF RVR  HPI: Teresa [name] is a 83 y.o. female  with a past medical history of Alzheimer's dementia, paroxsymal atrial fibrillation s/p PVI (05/2019) s/p DCCV (07/2023), ischemic cardiomyopathy, chronic HFrEF (35%), coronary artery disease s/p CABG (1994), hypertension, hyperlipidemia, COPD who presented to the ED on 06/26/2024 for palpitations, weakness and fatigue for past 3 days. EKG in ED with AF RVR rate 130s. Cardiology was consulted for further evaluation.   Interval History: -Patient seen and examined this AM and sitting up in bedside chair. Patient states she feels good this AM and denies chest pain, palpitations or SOB.  -Patients BP borderline, improving and HR improved rate 70s in SR. Overnight Tele showed no significant events.  -Patient remains on room air with stable SpO2.  -Recommend patient get up and move today with PT.   Review of systems complete and found to be negative unless listed above    Past Medical History:  Diagnosis Date   Arthritis    Asthma    Back pain    Breast cancer, left (HCC) 2009   CAD (coronary artery disease)    a. 1994 s/p CABG x 1 (LIMA->LAD); b. 03/2015 MV: No ischemia; c. MV 11/18: small fixed apical defect likely secondary to breast attenuation, EF of 42%, frequent PVCs; d. 12/2017 Cath: LM nl, LAD 20p, D1/2/3 nl, LCX min irregs, OM1/2/3 min irregs, RCA nl, RPDA nl, RPL1/2 nl, LIMA->LAD atretic.   Chronic anticoagulation    Apixaban    Chronic combined systolic (congestive) and diastolic (congestive) heart failure (HCC)    a. 2013 EF 40%;  b. 03/2015 Echo: EF 55-60%; c. 12/18 Echo: EF of 35-40%; d. 12/2017 TEE: EF 35-40%; e. 03/2018 Echo: EF 40-45%; f.  09/2018 Echo: EF 35%.   DDD (degenerative disc disease), lumbar    Dental crowns present    caps- left back top, right back bottom   Gastroesophageal reflux disease    Hyperlipidemia    Hypertension    LBBB (left bundle branch block)    MI (myocardial infarction) (HCC) 1994   Mixed Ischemic & Nonischemic cardiomyopathy    a. 2013 EF 40%;  b. 03/2015 Echo: EF 55-60%; c. 12/18 Echo: EF of 35-40%, hypokinesis of the anteroseptal, and apical myocardium, mild to mod MR, mod dil LA, nl RV fxn, PASP 53 mmHg; d. 12/2017 TEE: EF 35-40%, diff HK, mild to mod MR. small PFO. No LAA/RAA thrombus; e. 03/2018 Echo: EF 40-45%, antsept/inf HK, Gr2 DD, mild MR, mod idl LA, mild to mod TR, PASP 35-22mmHg; f. 09/2018 Echo: EF 35%.   Osteoarthritis    left hip and knee   Persistent atrial fibrillation (HCC)    a. diagnosed 01/13/2018; b. CHADS2VASc = 6 --> Eliquis ; c. 12/2017 s/p TEE/DCCV. Amio started but d/c'd 01/2018 2/2 n/anorexia; d. 10/2018 DCCV-->recurrent Afib w/in days; e. 10/2018 DCCV x 2 in ED->persistent Afib.   Personal history of radiation therapy    PONV (postoperative nausea and vomiting)    Psoriasis    PSVT (paroxysmal supraventricular tachycardia) (HCC)    a. 02/2015 Holter: short runs of SVT and NSVT.   Pulmonary hypertension (HCC)    PVC's (premature ventricular contractions)    a. 03/2018 24h Holter: Freq PVC's with a total  of 421 beats in 24 hrs. 7 short runs of SVT likely representing Afib.   Rheumatoid arthritis (HCC)    feet, hands   Vertigo    approx 2x/yr    Past Surgical History:  Procedure Laterality Date   ABDOMINAL HYSTERECTOMY     APPENDECTOMY     BREAST BIOPSY Right 1991   negative   BREAST EXCISIONAL BIOPSY Left 2009   positive   BREAST LUMPECTOMY Left 2009   CARDIAC CATHETERIZATION N/A 05/27/1988   Location: [facility]; Surgeon: Miquel Ellen, MD   CARDIOVERSION N/A 01/15/2018   Procedure: CARDIOVERSION;  Surgeon: Darron Deatrice LABOR, MD;  Location: [facility] ORS;  Service:  Cardiovascular;  Laterality: N/A;   CARDIOVERSION N/A 10/29/2018   Procedure: CARDIOVERSION;  Surgeon: Perla Evalene PARAS, MD;  Location: [facility] ORS;  Service: Cardiovascular;  Laterality: N/A;   CARDIOVERSION N/A 11/11/2018   DCCV x 2 attempts at 120 J; Location: [facility] ED   CERVICAL FUSION  2019   CHOLECYSTECTOMY     COLONOSCOPY     CORONARY ARTERY BYPASS GRAFT  04/11/1993   1v; LIMA-LAD; Location: [facility]; Surgeon: Lynwood Berg, MD   FOOT ARTHRODESIS Right 07/24/2016   Procedure: FUSION FIRST METATARSAL CUNEIFORM JOINT RIGHT FOOT, FUSION SECOND METATARSAL CUNEFORM JOINT BUNION REPAIR RIGHT FOOT;  Surgeon: Donnice Cory, DPM;  Location: [facility];  Service: Podiatry;  Laterality: Right;   HARDWARE REMOVAL Right 07/24/2016   Procedure: REMOVAL HARDWARE LATERAL MALLEOUS RIGHT ANKLE;  Surgeon: Donnice Cory, DPM;  Location: [facility];  Service: Podiatry;  Laterality: Right;  REMOVAL OF PIN WHICH WAS INTACT   HERNIA REPAIR     ventral   KNEE ARTHROSCOPY Left    LUMBAR FUSION     Pulmonary vein isolation (cardiac ablation) N/A 05/27/2019   Location: [facility]; Surgeon: Lamar Kerns, MD   RIGHT/LEFT HEART CATH AND CORONARY/GRAFT ANGIOGRAPHY N/A 01/14/2018   Procedure: LEFT HEART CATH AND CORONARY ANGIOGRAPHY;  Surgeon: Darron Deatrice LABOR, MD;  Location: [facility] INVASIVE CV LAB;  Service: Cardiovascular;  Laterality: N/A;   TEE WITHOUT CARDIOVERSION N/A 01/15/2018   Procedure: TRANSESOPHAGEAL ECHOCARDIOGRAM (TEE);  Surgeon: Darron Deatrice LABOR, MD;  Location: [facility] ORS;  Service: Cardiovascular;  Laterality: N/A;   TONSILLECTOMY     TOTAL HIP ARTHROPLASTY Left    TOTAL HIP ARTHROPLASTY Right 07/11/2021   Procedure: TOTAL HIP ARTHROPLASTY ANTERIOR APPROACH;  Surgeon: Kathlynn Sharper, MD;  Location: [facility] ORS;  Service: Orthopedics;  Laterality: Right;   TOTAL KNEE ARTHROPLASTY Left 12/19/2013   TUBAL LIGATION      Medications Prior to Admission  Medication Sig Dispense Refill Last  Dose/Taking   apixaban  (ELIQUIS ) 5 MG TABS tablet Take 1 tablet (5 mg total) by mouth 2 (two) times daily. 180 tablet 3 06/25/2024   cetirizine  (ZYRTEC ) 10 MG tablet TAKE 1 TABLET BY MOUTH EVERY DAY 90 tablet 3 Unknown   donepezil  (ARICEPT ) 5 MG tablet TAKE 1 TABLET BY MOUTH EVERYDAY AT BEDTIME 90 tablet 1 06/25/2024   fluticasone  (FLONASE ) 50 MCG/ACT nasal spray PLACE 2 SPRAYS INTO BOTH NOSTRILS DAILY AS NEEDED FOR ALLERGIES. 48 mL 4 Unknown   gabapentin  (NEURONTIN ) 300 MG capsule 1 CAPSULE IN THE MORNING, 2 CAPSULES IN THE EVENING 270 capsule 0 06/25/2024   memantine  (NAMENDA ) 10 MG tablet Take 10 mg by mouth in the morning and at bedtime.   06/25/2024   metoprolol  succinate (TOPROL -XL) 25 MG 24 hr tablet Take 12.5 mg by mouth daily as needed (ONLY IF HEART RATE IS ABOVE 50 BEATS PER  MINUTE).   Unknown   Multiple Vitamin (MULTIVITAMIN WITH MINERALS) TABS tablet Take 1 tablet by mouth daily.   06/25/2024   OTEZLA  30 MG TABS Take 30 mg by mouth in the morning and at bedtime.   06/25/2024   rosuvastatin  (CRESTOR ) 5 MG tablet TAKE 1 TABLET BY MOUTH EVERY DAY 90 tablet 3 06/25/2024   sacubitril -valsartan  (ENTRESTO ) 24-26 MG Take 1 tablet by mouth 2 (two) times daily.   06/25/2024   vitamin B-12 (CYANOCOBALAMIN ) 1000 MCG tablet Take 1,000 mcg by mouth daily.   06/25/2024   Social History   Socioeconomic History   Marital status: Divorced    Spouse name: Not on file   Number of children: 2   Years of education: Not on file   Highest education level: 11th grade  Occupational History   Occupation: retired  Tobacco Use   Smoking status: Former    Current packs/day: 0.00    Average packs/day: 0.5 packs/day for 35.0 years (17.5 ttl pk-yrs)    Types: Cigarettes    Start date: 14    Quit date: 1991    Years since quitting: 34.5   Smokeless tobacco: Never   Tobacco comments:    Quit approx 1990  Vaping Use   Vaping status: Never Used  Substance and Sexual Activity   Alcohol  use: No   Drug use: No   Sexual  activity: Not on file  Other Topics Concern   Not on file  Social History Narrative   Not on file   Social Drivers of Health   Financial Resource Strain: Low Risk  (06/03/2023)   Received from [facility]   Overall Financial Resource Strain (CARDIA)    Difficulty of Paying Living Expenses: Not hard at all  Food Insecurity: No Food Insecurity (06/03/2023)   Received from [facility]   Hunger Vital Sign    Within the past 12 months, you worried that your food would run out before you got the money to buy more.: Never true    Within the past 12 months, the food you bought just didn't last and you didn't have money to get more.: Never true  Transportation Needs: No Transportation Needs (06/03/2023)   Received from [facility] - Transportation    In the past 12 months, has lack of transportation kept you from medical appointments or from getting medications?: No    Lack of Transportation (Non-Medical): No  Physical Activity: Insufficiently Active (06/03/2023)   Received from [facility]   Exercise Vital Sign    On average, how many days per week do you engage in moderate to strenuous exercise (like a brisk walk)?: 3 days    On average, how many minutes do you engage in exercise at this level?: 20 min  Stress: No Stress Concern Present (06/03/2023)   Received from [facility] - Occupational Stress Questionnaire    Feeling of Stress : Only a little  Social Connections: Moderately Isolated (07/02/2022)   Social Connection and Isolation Panel    Frequency of Communication with Friends and Family: More than three times a week    Frequency of Social Gatherings with Friends and Family: Once a week    Attends Religious Services: More than 4 times per year    Active Member of [employer] or Organizations: No    Attends Banker Meetings: Never     Marital Status:  Divorced  Intimate Partner Violence: Not At Risk (07/02/2022)   Humiliation, Afraid, Rape, and Kick questionnaire    Fear of Current or Ex-Partner: No    Emotionally Abused: No    Physically Abused: No    Sexually Abused: No    Family History  Problem Relation Age of Onset   Heart attack Father    Congestive Heart Failure Mother    Breast cancer Mother 31   Kidney failure Sister    Prostate cancer Brother    Bladder Cancer Brother    Lung cancer Sister    Arthritis Sister    Breast cancer Sister    Arthritis Sister    Breast cancer Sister    Lung cancer Sister    Lung cancer Brother    Arthritis Brother      Vitals:   06/28/24 1700 06/28/24 2000 06/29/24 0000 06/29/24 0400  BP: 129/86 131/74 103/62 (!) 119/55  Pulse: (!) 123 83 85 73  Resp: 14 17 16 13   Temp:  97.9 F (36.6 C) 97.8 F (36.6 C) 97.9 F (36.6 C)  TempSrc:  Oral Oral Oral  SpO2: 99% 96% 96% 96%  Weight:      Height:        PHYSICAL EXAM General: Chronically ill-appearing elderly female, well nourished, in no acute distress. HEENT: Normocephalic and atraumatic. Neck: No JVD.   Lungs: Normal respiratory effort on air. Clear bilaterally to auscultation. No wheezes, crackles, rhonchi.  Heart: HRRR. Normal S1 and S2 without gallops or murmurs.  Abdomen: Non-distended appearing.  Msk: Normal strength and tone for age. Extremities: Warm and well perfused. No clubbing, cyanosis, edema.  Neuro: Alert and oriented X 3. Psych: Answers questions appropriately, slow to respond.  Labs: Basic Metabolic Panel: Recent Labs    06/27/24 1952 06/29/24 0319  NA 140 139  K 4.1 3.6  CL 108 108  CO2 18* 23  GLUCOSE 105* 78  BUN 16 16  CREATININE 0.53 0.72  CALCIUM  9.0 8.9  MG 2.2 2.1   Liver Function Tests: No results for input(s): AST, ALT, ALKPHOS, BILITOT, PROT, ALBUMIN in the last 72 hours. No results for input(s): LIPASE, AMYLASE in the last 72 hours. CBC: Recent Labs     06/26/24 1424 06/27/24 1750  WBC 5.7 7.3  HGB 14.9 14.4  HCT 46.2* 44.3  MCV 94.7 94.5  PLT 208 189   Cardiac Enzymes: Recent Labs    06/26/24 1424 06/26/24 1624  TROPONINIHS 30* 35*   BNP: Recent Labs    06/27/24 1750  BNP 983.4*   D-Dimer: No results for input(s): DDIMER in the last 72 hours. Hemoglobin A1C: No results for input(s): HGBA1C in the last 72 hours. Fasting Lipid Panel: No results for input(s): CHOL, HDL, LDLCALC, TRIG, CHOLHDL, LDLDIRECT in the last 72 hours. Thyroid  Function Tests: Recent Labs    06/26/24 1424  TSH 1.463   Anemia Panel: No results for input(s): VITAMINB12, FOLATE, FERRITIN, TIBC, IRON, RETICCTPCT in the last 72 hours.   Radiology: ECHOCARDIOGRAM COMPLETE Result Date: 06/27/2024    ECHOCARDIOGRAM REPORT   Patient Name:   Teresa [name] [facility] Date of Exam: 06/27/2024 Medical Rec #:  [id number]       Height:       64.0 in Accession #:    [id number]      Weight:       154.7 lb Date of Birth:  [birth year]       BSA:  1.754 m Patient Age:    82 years        BP:           107/47 mmHg Patient Gender: F               HR:           100 bpm. Exam Location:  [facility] Procedure: 2D Echo, Cardiac Doppler, Color Doppler and Intracardiac            Opacification Agent (Both Spectral and Color Flow Doppler were            utilized during procedure). Indications:     Atrial Fibrillation I48.91  History:         Patient has prior history of Echocardiogram examinations, most                  recent 04/07/2018. Arrythmias:Atrial Fibrillation.  Sonographer:     Ashley McNeely-Sloane Referring Phys:  [id number] DAWOOD GORMAN LYE Diagnosing Phys: Cara JONETTA Lovelace MD IMPRESSIONS  1. Left ventricular ejection fraction, by estimation, is 40 to 45%. The left ventricle has mildly decreased function. The left ventricle demonstrates global hypokinesis. There is mild concentric left ventricular hypertrophy. Left ventricular diastolic parameters are  consistent with Grade I diastolic dysfunction (impaired relaxation).  2. Right ventricular systolic function is normal. The right ventricular size is normal.  3. Left atrial size was moderately dilated.  4. Right atrial size was mildly dilated.  5. The mitral valve is normal in structure. Trivial mitral valve regurgitation.  6. The aortic valve is normal in structure. Aortic valve regurgitation is not visualized. FINDINGS  Left Ventricle: Left ventricular ejection fraction, by estimation, is 40 to 45%. The left ventricle has mildly decreased function. The left ventricle demonstrates global hypokinesis. Definity  contrast agent was given IV to delineate the left ventricular  endocardial borders. Strain was performed and the global longitudinal strain is indeterminate. Global longitudinal strain performed but not reported based on interpreter judgement due to suboptimal tracking. The left ventricular internal cavity size was  normal in size. There is mild concentric left ventricular hypertrophy. Left ventricular diastolic parameters are consistent with Grade I diastolic dysfunction (impaired relaxation). Right Ventricle: The right ventricular size is normal. No increase in right ventricular wall thickness. Right ventricular systolic function is normal. Left Atrium: Left atrial size was moderately dilated. Right Atrium: Right atrial size was mildly dilated. Pericardium: There is no evidence of pericardial effusion. Mitral Valve: The mitral valve is normal in structure. Trivial mitral valve regurgitation. MV peak gradient, 5.8 mmHg. The mean mitral valve gradient is 3.0 mmHg. Tricuspid Valve: The tricuspid valve is normal in structure. Tricuspid valve regurgitation is mild. Aortic Valve: The aortic valve is normal in structure. Aortic valve regurgitation is not visualized. Aortic valve mean gradient measures 3.0 mmHg. Aortic valve peak gradient measures 5.6 mmHg. Aortic valve area, by VTI measures 1.84 cm. Pulmonic  Valve: The pulmonic valve was normal in structure. Pulmonic valve regurgitation is not visualized. Aorta: The ascending aorta was not well visualized. IAS/Shunts: No atrial level shunt detected by color flow Doppler. Additional Comments: 3D was performed not requiring image post processing on an independent workstation and was indeterminate.  LEFT VENTRICLE PLAX 2D LVIDd:         4.50 cm     Diastology LVIDs:         4.10 cm     LV e' medial:    8.85 cm/s LV PW:  1.30 cm     LV E/e' medial:  11.2 LV IVS:        1.20 cm     LV e' lateral:   12.20 cm/s LVOT diam:     2.00 cm     LV E/e' lateral: 8.1 LV SV:         41 LV SV Index:   24 LVOT Area:     3.14 cm  LV Volumes (MOD) LV vol d, MOD A2C: 32.3 ml LV vol d, MOD A4C: 97.0 ml LV vol s, MOD A2C: 19.2 ml LV vol s, MOD A4C: 58.5 ml LV SV MOD A2C:     13.1 ml LV SV MOD A4C:     97.0 ml LV SV MOD BP:      24.0 ml RIGHT VENTRICLE            IVC RV Basal diam:  4.30 cm    IVC diam: 2.60 cm RV Mid diam:    3.10 cm RV S prime:     9.90 cm/s TAPSE (M-mode): 1.4 cm LEFT ATRIUM             Index        RIGHT ATRIUM           Index LA diam:        4.90 cm 2.79 cm/m   RA Area:     12.70 cm LA Vol (A2C):   49.5 ml 28.22 ml/m  RA Volume:   27.20 ml  15.51 ml/m LA Vol (A4C):   43.9 ml 25.03 ml/m LA Biplane Vol: 49.8 ml 28.39 ml/m  AORTIC VALVE                    PULMONIC VALVE AV Area (Vmax):    2.01 cm     PV Vmax:        1.47 m/s AV Area (Vmean):   1.89 cm     PV Vmean:       103.000 cm/s AV Area (VTI):     1.84 cm     PV VTI:         0.279 m AV Vmax:           118.00 cm/s  PV Peak grad:   8.6 mmHg AV Vmean:          79.100 cm/s  PV Mean grad:   5.0 mmHg AV VTI:            0.225 m      RVOT Peak grad: 3 mmHg AV Peak Grad:      5.6 mmHg AV Mean Grad:      3.0 mmHg LVOT Vmax:         75.50 cm/s LVOT Vmean:        47.700 cm/s LVOT VTI:          0.132 m LVOT/AV VTI ratio: 0.59  AORTA Ao Root diam: 2.60 cm Ao Asc diam:  2.80 cm MITRAL VALVE               TRICUSPID  VALVE MV Area (PHT): 3.37 cm    TR Peak grad:   26.6 mmHg MV Area VTI:   1.95 cm    TR Mean grad:   19.0 mmHg MV Peak grad:  5.8 mmHg    TR Vmax:        258.00 cm/s MV Mean grad:  3.0 mmHg    TR Vmean:  210.0 cm/s MV Vmax:       1.20 m/s MV Vmean:      82.4 cm/s   SHUNTS MV Decel Time: 225 msec    Systemic VTI:  0.13 m MV E velocity: 99.20 cm/s  Systemic Diam: 2.00 cm                            Pulmonic VTI:  0.142 m Cara JONETTA Lovelace MD Electronically signed by Cara JONETTA Lovelace MD Signature Date/Time: 06/27/2024/5:02:16 PM    Final    DG Chest Port 1 View Result Date: 06/26/2024 CLINICAL DATA:  [id number] A-fib [facility]) [id number] EXAM: PORTABLE CHEST 1 VIEW COMPARISON:  Chest x-ray 07/20/2023 FINDINGS: The heart and mediastinal contours are unchanged. Atherosclerotic plaque. No focal consolidation. Mild pulmonary edema. No pleural effusion. No pneumothorax. No acute osseous abnormality.  Sternotomy wires are intact. IMPRESSION: 1. Mild pulmonary edema. 2.  Aortic Atherosclerosis (ICD10-I70.0). Electronically Signed   By: Morgane  Naveau M.D.   On: 06/26/2024 16:09    ECHO as above.  TELEMETRY reviewed by me 06/29/2024: Sinus rhythm , rate 70s  EKG reviewed by me: atrial fibrillation, LBBB rate 133 bpm EKG (07/09): SR with 1st degree AVB, rate 74 bpm  Data reviewed by me 06/29/2024: last 24h vitals tele labs imaging I/O hospitalist progress notes.  Principal Problem:   Atrial fibrillation with RVR (HCC) Active Problems:   S/P CABG (coronary artery bypass graft)   Atrial fibrillation with rapid ventricular response (HCC)   Psoriatic arthritis (HCC)   Rheumatoid arthritis (HCC)    ASSESSMENT AND PLAN:  Teresa [name] is a 83 y.o. female  with a past medical history of paroxsymal atrial fibrillation s/p PVI (05/2019) s/p DCCV (07/2023), ischemic cardiomyopathy, chronic HFrEF (35%), coronary artery disease s/p CABG (1994), hypertension, hyperlipidemia, COPD who presented to the ED on 06/26/2024 for  palpitations, weakness and fatigue for past 3 days. EKG in ED with AF RVR rate 130s. Cardiology was consulted for further evaluation.   # Atrial fibrillation RVR  # Paroxsymal AF s/p PVI (2020) s/p DCCV (07/2023) Patient presents with weakness, fatigue.  Patient denies any palpitations.  EKG in ED with AF RVR rate 130s.  Heart rate improved s/p Cardizem  drip, d/c due to low EF.  Patient with known history of AF s/p PVI and DCCV. Discussions outpatient about redoing PVI due to recurrence of AF. Patient has documented allergy to Amiodarone . Per tele in SR rate 70s this AM. -Monitor and replenish electrolytes for a goal K >4, Mag >2  -Continue home Eliquis  5 mg BID for stroke risk reduction. -Continue metoprolol  tartrate 50 mg BID. Will uptitrate as BP allows.  -Continue PO digoxin  0.0625 mg daily for rate control.  -Recommend avoiding Cardizem  due to patient's reduced EF.  -Patient has documented allergy to amiodarone .  Will optimize rate control and defer using rhythm management at this time. -Discussed patient's atrial fibrillation with EP (07/08).  EP does not recommend starting dofetilide at this time as this needs to be done at another hospital for closer monitoring of QTc.  Recommended outpatient follow-up for ablation.  # Ischemic Cardiomyopathy  # Chronic HFrEF Patient without SOB, LEE.  Patient appears near euvolemic on exam. CXR with mild pulmonary edema in setting of AF RVR. Echo this admission with reduced EF 40-45%, global hypokinesis, mild concentric LVH, trivial MR. -Continue metoprolol  as stated above.  -Continue losartan  25 mg daily for now. Will plan to resume  home Entresto  24-26 mg BID as BP allows, this can also be done outpatient.  -Continue dapagliflozin  10 mg daily. (Copay $0) -Orderespironolactone  25 mg daily. -Will plan to optimize GDMT as BP and renal function allow.  # Coronary artery disease s/p CABG (1994) # Hypertension # Hyperlipidemia  Patient without chest  pain. Trops 30 > 35. EKG in ED with atrial fibrillation, LBBB rate 133 bpm (known LBBB).  -Continue home rosuvastatin  5 mg daily.  -Continue metoprolol , losartan  as stated above. -Minimally elevated and flat trops in setting of AF RVR is most consistent with demand/supply mismatch and not ACS   This patient's plan of care was discussed and created with Dr. Florencio and he is in agreement.  Signed: Dorene Comfort, PA-C  06/29/2024, 9:32 AM [facility]

## 2024-06-29 NOTE — Care Management Important Message (Signed)
 Important Message  Patient Details  Name: Teresa [name] MRN: [id number] Date of Birth: March 10, 1941   Important Message Given:  Yes - Medicare IM     Rojelio SHAUNNA Rattler 06/29/2024, 11:58 AM

## 2024-06-30 ENCOUNTER — Other Ambulatory Visit: Payer: Self-pay

## 2024-06-30 DIAGNOSIS — L4050 Arthropathic psoriasis, unspecified: Secondary | ICD-10-CM

## 2024-06-30 DIAGNOSIS — I4891 Unspecified atrial fibrillation: Secondary | ICD-10-CM | POA: Diagnosis not present

## 2024-06-30 DIAGNOSIS — M069 Rheumatoid arthritis, unspecified: Secondary | ICD-10-CM

## 2024-06-30 DIAGNOSIS — Z951 Presence of aortocoronary bypass graft: Secondary | ICD-10-CM

## 2024-06-30 LAB — BASIC METABOLIC PANEL WITH GFR
Anion gap: 13 (ref 5–15)
BUN: 21 mg/dL (ref 8–23)
CO2: 21 mmol/L — ABNORMAL LOW (ref 22–32)
Calcium: 9.1 mg/dL (ref 8.9–10.3)
Chloride: 106 mmol/L (ref 98–111)
Creatinine, Ser: 0.65 mg/dL (ref 0.44–1.00)
GFR, Estimated: >60 mL/min (ref >60)
Glucose, Bld: 80 mg/dL (ref 70–99)
Potassium: 4.2 mmol/L (ref 3.5–5.1)
Sodium: 140 mmol/L (ref 135–145)

## 2024-06-30 LAB — MAGNESIUM: Magnesium: 2.1 mg/dL (ref 1.7–2.4)

## 2024-06-30 MED ORDER — METOPROLOL TARTRATE 50 MG PO TABS
50.0000 mg | ORAL_TABLET | Freq: Two times a day (BID) | ORAL | 2 refills | Status: AC
  Filled 2024-06-30: qty 60, 30d supply, fill #0

## 2024-06-30 MED ORDER — LOSARTAN POTASSIUM 25 MG PO TABS
25.0000 mg | ORAL_TABLET | Freq: Every day | ORAL | 1 refills | Status: AC
  Filled 2024-06-30: qty 30, 30d supply, fill #0

## 2024-06-30 MED ORDER — DAPAGLIFLOZIN PROPANEDIOL 10 MG PO TABS
10.0000 mg | ORAL_TABLET | Freq: Every day | ORAL | 2 refills | Status: AC
Start: 2024-06-30 — End: ?
  Filled 2024-06-30: qty 30, 30d supply, fill #0

## 2024-06-30 MED ORDER — DIGOXIN 125 MCG PO TABS
0.0625 mg | ORAL_TABLET | Freq: Every day | ORAL | 1 refills | Status: AC
  Filled 2024-06-30: qty 30, 60d supply, fill #0

## 2024-06-30 MED ORDER — SPIRONOLACTONE 25 MG PO TABS
25.0000 mg | ORAL_TABLET | Freq: Every day | ORAL | 2 refills | Status: AC
  Filled 2024-06-30: qty 30, 30d supply, fill #0

## 2024-06-30 NOTE — TOC Transition Note (Addendum)
 Transition of Care [facility]) - Discharge Note   Patient Details  Name: Teresa [name] MRN: [id number] Date of Birth: [birth year]  [facility] CM/SW Contact:  Alvaro Louder, LCSW Phone Number: 06/30/2024, 11:46 AM   Clinical Narrative:   No further concerns or TOC needs. Daughter to transport patient home at discharge    [facility] Signing off      Barriers to Discharge: Continued Medical Work up   Patient Goals and CMS Choice            Discharge Placement                       Discharge Plan and Services Additional resources added to the After Visit Summary for       Post Acute Care Choice: NA                               Social Drivers of Health (SDOH) Interventions SDOH Screenings   Food Insecurity: No Food Insecurity (06/29/2024)  Housing: Low Risk  (06/29/2024)  Transportation Needs: No Transportation Needs (06/29/2024)  Utilities: Not At Risk (06/29/2024)  Alcohol  Screen: Low Risk  (07/02/2022)  Depression (PHQ2-9): Low Risk  (07/02/2022)  Financial Resource Strain: Low Risk  (06/03/2023)   Received from [facility]  Physical Activity: Insufficiently Active (06/03/2023)   Received from [facility]  Social Connections: Moderately Isolated (06/29/2024)  Stress: No Stress Concern Present (06/03/2023)   Received from [facility]  Tobacco Use: Medium Risk (06/26/2024)  Health Literacy: Adequate Health Literacy (06/03/2023)   Received from [facility]     Readmission Risk Interventions     No data to display

## 2024-06-30 NOTE — Progress Notes (Signed)
 [facility] CARDIOLOGY PROGRESS NOTE       Patient ID: Teresa [name] MRN: [id number] DOB/AGE: 28-Oct-1941 83 y.o.  Admit date: 06/26/2024 Referring Physician Dr. Sherlon Primary Physician Bertrum Charlie CROME, MD Primary Cardiologist [facility]) Reason for Consultation AF RVR  HPI: Teresa [name] is a 83 y.o. female  with a past medical history of Alzheimer's dementia, paroxsymal atrial fibrillation s/p PVI (05/2019) s/p DCCV (07/2023), ischemic cardiomyopathy, chronic HFrEF (35%), coronary artery disease s/p CABG (1994), hypertension, hyperlipidemia, COPD who presented to the ED on 06/26/2024 for palpitations, weakness and fatigue for past 3 days. EKG in ED with AF RVR rate 130s. Cardiology was consulted for further evaluation.   Interval History: -Patient seen and examined this AM and lying comfortably in hospital bed.  Patient states she feels okay this AM and denies chest pain, palpitations or SOB.  -Patients BP improving, HR controlled 70-80s this AM.  Per telemetry in AF with controlled rates. -Patient remains on room air with stable SpO2.  -Recommend patient get up and move today with PT.   Review of systems complete and found to be negative unless listed above    Past Medical History:  Diagnosis Date   Arthritis    Asthma    Back pain    Breast cancer, left (HCC) 2009   CAD (coronary artery disease)    a. 1994 s/p CABG x 1 (LIMA->LAD); b. 03/2015 MV: No ischemia; c. MV 11/18: small fixed apical defect likely secondary to breast attenuation, EF of 42%, frequent PVCs; d. 12/2017 Cath: LM nl, LAD 20p, D1/2/3 nl, LCX min irregs, OM1/2/3 min irregs, RCA nl, RPDA nl, RPL1/2 nl, LIMA->LAD atretic.   Chronic anticoagulation    Apixaban    Chronic combined systolic (congestive) and diastolic (congestive) heart failure (HCC)    a. 2013 EF 40%;  b. 03/2015 Echo: EF 55-60%; c. 12/18 Echo: EF of 35-40%; d. 12/2017 TEE: EF 35-40%; e. 03/2018 Echo: EF 40-45%; f. 09/2018  Echo: EF 35%.   DDD (degenerative disc disease), lumbar    Dental crowns present    caps- left back top, right back bottom   Gastroesophageal reflux disease    Hyperlipidemia    Hypertension    LBBB (left bundle branch block)    MI (myocardial infarction) (HCC) 1994   Mixed Ischemic & Nonischemic cardiomyopathy    a. 2013 EF 40%;  b. 03/2015 Echo: EF 55-60%; c. 12/18 Echo: EF of 35-40%, hypokinesis of the anteroseptal, and apical myocardium, mild to mod MR, mod dil LA, nl RV fxn, PASP 53 mmHg; d. 12/2017 TEE: EF 35-40%, diff HK, mild to mod MR. small PFO. No LAA/RAA thrombus; e. 03/2018 Echo: EF 40-45%, antsept/inf HK, Gr2 DD, mild MR, mod idl LA, mild to mod TR, PASP 35-66mmHg; f. 09/2018 Echo: EF 35%.   Osteoarthritis    left hip and knee   Persistent atrial fibrillation (HCC)    a. diagnosed 01/13/2018; b. CHADS2VASc = 6 --> Eliquis ; c. 12/2017 s/p TEE/DCCV. Amio started but d/c'd 01/2018 2/2 n/anorexia; d. 10/2018 DCCV-->recurrent Afib w/in days; e. 10/2018 DCCV x 2 in ED->persistent Afib.   Personal history of radiation therapy    PONV (postoperative nausea and vomiting)    Psoriasis    PSVT (paroxysmal supraventricular tachycardia) (HCC)    a. 02/2015 Holter: short runs of SVT and NSVT.   Pulmonary hypertension (HCC)    PVC's (premature ventricular contractions)    a. 03/2018 24h Holter: Freq PVC's with a total of  421 beats in 24 hrs. 7 short runs of SVT likely representing Afib.   Rheumatoid arthritis (HCC)    feet, hands   Vertigo    approx 2x/yr    Past Surgical History:  Procedure Laterality Date   ABDOMINAL HYSTERECTOMY     APPENDECTOMY     BREAST BIOPSY Right 1991   negative   BREAST EXCISIONAL BIOPSY Left 2009   positive   BREAST LUMPECTOMY Left 2009   CARDIAC CATHETERIZATION N/A 05/27/1988   Location: [facility]; Surgeon: Miquel Ellen, MD   CARDIOVERSION N/A 01/15/2018   Procedure: CARDIOVERSION;  Surgeon: Darron Deatrice LABOR, MD;  Location: [facility] ORS;  Service: Cardiovascular;   Laterality: N/A;   CARDIOVERSION N/A 10/29/2018   Procedure: CARDIOVERSION;  Surgeon: Perla Evalene PARAS, MD;  Location: [facility] ORS;  Service: Cardiovascular;  Laterality: N/A;   CARDIOVERSION N/A 11/11/2018   DCCV x 2 attempts at 120 J; Location: [facility] ED   CERVICAL FUSION  2019   CHOLECYSTECTOMY     COLONOSCOPY     CORONARY ARTERY BYPASS GRAFT  04/11/1993   1v; LIMA-LAD; Location: [facility]; Surgeon: Lynwood Berg, MD   FOOT ARTHRODESIS Right 07/24/2016   Procedure: FUSION FIRST METATARSAL CUNEIFORM JOINT RIGHT FOOT, FUSION SECOND METATARSAL CUNEFORM JOINT BUNION REPAIR RIGHT FOOT;  Surgeon: Donnice Cory, DPM;  Location: [facility];  Service: Podiatry;  Laterality: Right;   HARDWARE REMOVAL Right 07/24/2016   Procedure: REMOVAL HARDWARE LATERAL MALLEOUS RIGHT ANKLE;  Surgeon: Donnice Cory, DPM;  Location: [facility];  Service: Podiatry;  Laterality: Right;  REMOVAL OF PIN WHICH WAS INTACT   HERNIA REPAIR     ventral   KNEE ARTHROSCOPY Left    LUMBAR FUSION     Pulmonary vein isolation (cardiac ablation) N/A 05/27/2019   Location: [facility]; Surgeon: Lamar Kerns, MD   RIGHT/LEFT HEART CATH AND CORONARY/GRAFT ANGIOGRAPHY N/A 01/14/2018   Procedure: LEFT HEART CATH AND CORONARY ANGIOGRAPHY;  Surgeon: Darron Deatrice LABOR, MD;  Location: [facility] INVASIVE [facility];  Service: Cardiovascular;  Laterality: N/A;   TEE WITHOUT CARDIOVERSION N/A 01/15/2018   Procedure: TRANSESOPHAGEAL ECHOCARDIOGRAM (TEE);  Surgeon: Darron Deatrice LABOR, MD;  Location: [facility] OR[facility];  Service: Cardiovascular;  Laterality: N/A;   TONSILLECTOMY     TOTAL HIP ARTHROPLASTY Left    TOTAL HIP ARTHROPLASTY Right 07/11/2021   Procedure: TOTAL HIP ARTHROPLASTY ANTERIOR APPROACH;  Surgeon: Kathlynn Sharper, MD;  Location: [facility] ORS;  Service: Orthopedics;  Laterality: Right;   TOTAL KNEE ARTHROPLASTY Left 12/19/2013   TUBAL LIGATION      Medications Prior to Admission  Medication Sig Dispense Refill Last Dose/Taking   apixaban   (ELIQUIS ) 5 MG TABS tablet Take 1 tablet (5 mg total) by mouth 2 (two) times daily. 180 tablet 3 06/25/2024   cetirizine  (ZYRTEC ) 10 MG tablet TAKE 1 TABLET BY MOUTH EVERY DAY 90 tablet 3 Unknown   donepezil  (ARICEPT ) 5 MG tablet TAKE 1 TABLET BY MOUTH EVERYDAY AT BEDTIME 90 tablet 1 06/25/2024   fluticasone  (FLONASE ) 50 MCG/ACT nasal spray PLACE 2 SPRAYS INTO BOTH NOSTRILS DAILY AS NEEDED FOR ALLERGIES. 48 mL 4 Unknown   gabapentin  (NEURONTIN ) 300 MG capsule 1 CAPSULE IN THE MORNING, 2 CAPSULES IN THE EVENING 270 capsule 0 06/25/2024   memantine  (NAMENDA ) 10 MG tablet Take 10 mg by mouth in the morning and at bedtime.   06/25/2024   metoprolol  succinate (TOPROL -XL) 25 MG 24 hr tablet Take 12.5 mg by mouth daily as needed (ONLY IF HEART RATE IS ABOVE 50 BEATS PER MINUTE).  Unknown   Multiple Vitamin (MULTIVITAMIN WITH MINERALS) TABS tablet Take 1 tablet by mouth daily.   06/25/2024   OTEZLA  30 MG TABS Take 30 mg by mouth in the morning and at bedtime.   06/25/2024   rosuvastatin  (CRESTOR ) 5 MG tablet TAKE 1 TABLET BY MOUTH EVERY DAY 90 tablet 3 06/25/2024   sacubitril -valsartan  (ENTRESTO ) 24-26 MG Take 1 tablet by mouth 2 (two) times daily.   06/25/2024   vitamin B-12 (CYANOCOBALAMIN ) 1000 MCG tablet Take 1,000 mcg by mouth daily.   06/25/2024   Social History   Socioeconomic History   Marital status: Divorced    Spouse name: Not on file   Number of children: 2   Years of education: Not on file   Highest education level: 11th grade  Occupational History   Occupation: retired  Tobacco Use   Smoking status: Former    Current packs/day: 0.00    Average packs/day: 0.5 packs/day for 35.0 years (17.5 ttl pk-yrs)    Types: Cigarettes    Start date: 73    Quit date: 1991    Years since quitting: 34.5   Smokeless tobacco: Never   Tobacco comments:    Quit approx 1990  Vaping Use   Vaping status: Never Used  Substance and Sexual Activity   Alcohol  use: No   Drug use: No   Sexual activity: Not on file   Other Topics Concern   Not on file  Social History Narrative   Not on file   Social Drivers of Health   Financial Resource Strain: Low Risk  (06/03/2023)   Received from [facility]   Overall Financial Resource Strain (CARDIA)    Difficulty of Paying Living Expenses: Not hard at all  Food Insecurity: No Food Insecurity (06/29/2024)   Hunger Vital Sign    Worried About Running Out of Food in the Last Year: Never true    Ran Out of Food in the Last Year: Never true  Transportation Needs: No Transportation Needs (06/29/2024)   PRAPARE - Administrator, Civil Service (Medical): No    Lack of Transportation (Non-Medical): No  Physical Activity: Insufficiently Active (06/03/2023)   Received from [facility]   Exercise Vital Sign    On average, how many days per week do you engage in moderate to strenuous exercise (like a brisk walk)?: 3 days    On average, how many minutes do you engage in exercise at this level?: 20 min  Stress: No Stress Concern Present (06/03/2023)   Received from [facility] - Occupational Stress Questionnaire    Feeling of Stress : Only a little  Social Connections: Moderately Isolated (06/29/2024)   Social Connection and Isolation Panel    Frequency of Communication with Friends and Family: More than three times a week    Frequency of Social Gatherings with Friends and Family: Once a week    Attends Religious Services: More than 4 times per year    Active Member of [employer] or Organizations: No    Attends Banker Meetings: Never    Marital Status: Divorced  Catering manager Violence: Not At Risk (06/29/2024)   Humiliation, Afraid, Rape, and Kick questionnaire    Fear of Current or Ex-Partner: No    Emotionally Abused: No    Physically Abused: No    Sexually Abused: No    Family History  Problem Relation Age of Onset  Heart attack Father     Congestive Heart Failure Mother    Breast cancer Mother 20   Kidney failure Sister    Prostate cancer Brother    Bladder Cancer Brother    Lung cancer Sister    Arthritis Sister    Breast cancer Sister    Arthritis Sister    Breast cancer Sister    Lung cancer Sister    Lung cancer Brother    Arthritis Brother      Vitals:   06/29/24 2000 06/29/24 2056 06/30/24 0034 06/30/24 0438  BP: (!) 109/90 116/85  (!) 135/99  Pulse:   98 63  Resp: 20 20 20 18   Temp:   98.3 F (36.8 C) 97.7 F (36.5 C)  TempSrc:      SpO2: 98% 98% 100% 100%  Weight:      Height:        PHYSICAL EXAM General: Chronically ill-appearing elderly female, well nourished, in no acute distress. HEENT: Normocephalic and atraumatic. Neck: No JVD.   Lungs: Normal respiratory effort on air. Clear bilaterally to auscultation. No wheezes, crackles, rhonchi.  Heart: Irregularly irregular, controlled rates. Normal S1 and S2 without gallops or murmurs.  Abdomen: Non-distended appearing.  Msk: Normal strength and tone for age. Extremities: Warm and well perfused. No clubbing, cyanosis, edema.  Neuro: Alert and oriented X 3. Psych: Answers questions appropriately, slow to respond.  Labs: Basic Metabolic Panel: Recent Labs    06/29/24 0319 06/30/24 0312  NA 139 140  K 3.6 4.2  CL 108 106  CO2 23 21*  GLUCOSE 78 80  BUN 16 21  CREATININE 0.72 0.65  CALCIUM  8.9 9.1  MG 2.1 2.1   Liver Function Tests: No results for input(s): AST, ALT, ALKPHOS, BILITOT, PROT, ALBUMIN in the last 72 hours. No results for input(s): LIPASE, AMYLASE in the last 72 hours. CBC: Recent Labs    06/27/24 1750  WBC 7.3  HGB 14.4  HCT 44.3  MCV 94.5  PLT 189   Cardiac Enzymes: No results for input(s): CKTOTAL, CKMB, CKMBINDEX, TROPONINIHS in the last 72 hours.  BNP: Recent Labs    06/27/24 1750  BNP 983.4*   D-Dimer: No results for input(s): DDIMER in the last 72 hours. Hemoglobin A1C: No  results for input(s): HGBA1C in the last 72 hours. Fasting Lipid Panel: No results for input(s): CHOL, HDL, LDLCALC, TRIG, CHOLHDL, LDLDIRECT in the last 72 hours. Thyroid  Function Tests: No results for input(s): TSH, T4TOTAL, T3FREE, THYROIDAB in the last 72 hours.  Invalid input(s): FREET3  Anemia Panel: No results for input(s): VITAMINB12, FOLATE, FERRITIN, TIBC, IRON, RETICCTPCT in the last 72 hours.   Radiology: ECHOCARDIOGRAM COMPLETE Result Date: 06/27/2024    ECHOCARDIOGRAM REPORT   Patient Name:   Teresa [name] [facility] Date of Exam: 06/27/2024 Medical Rec #:  [id number]       Height:       64.0 in Accession #:    [id number]      Weight:       154.7 lb Date of Birth:  09-Nov-1941       BSA:          1.754 m Patient Age:    82 years        BP:           107/47 mmHg Patient Gender: F               HR:           100  bpm. Exam Location:  [facility] Procedure: 2D Echo, Cardiac Doppler, Color Doppler and Intracardiac            Opacification Agent (Both Spectral and Color Flow Doppler were            utilized during procedure). Indications:     Atrial Fibrillation I48.91  History:         Patient has prior history of Echocardiogram examinations, most                  recent 04/07/2018. Arrythmias:Atrial Fibrillation.  Sonographer:     Ashley McNeely-Sloane Referring Phys:  [id number] DAWOOD GORMAN LYE Diagnosing Phys: Cara JONETTA Lovelace MD IMPRESSIONS  1. Left ventricular ejection fraction, by estimation, is 40 to 45%. The left ventricle has mildly decreased function. The left ventricle demonstrates global hypokinesis. There is mild concentric left ventricular hypertrophy. Left ventricular diastolic parameters are consistent with Grade I diastolic dysfunction (impaired relaxation).  2. Right ventricular systolic function is normal. The right ventricular size is normal.  3. Left atrial size was moderately dilated.  4. Right atrial size was mildly dilated.  5. The mitral valve is normal in  structure. Trivial mitral valve regurgitation.  6. The aortic valve is normal in structure. Aortic valve regurgitation is not visualized. FINDINGS  Left Ventricle: Left ventricular ejection fraction, by estimation, is 40 to 45%. The left ventricle has mildly decreased function. The left ventricle demonstrates global hypokinesis. Definity  contrast agent was given IV to delineate the left ventricular  endocardial borders. Strain was performed and the global longitudinal strain is indeterminate. Global longitudinal strain performed but not reported based on interpreter judgement due to suboptimal tracking. The left ventricular internal cavity size was  normal in size. There is mild concentric left ventricular hypertrophy. Left ventricular diastolic parameters are consistent with Grade I diastolic dysfunction (impaired relaxation). Right Ventricle: The right ventricular size is normal. No increase in right ventricular wall thickness. Right ventricular systolic function is normal. Left Atrium: Left atrial size was moderately dilated. Right Atrium: Right atrial size was mildly dilated. Pericardium: There is no evidence of pericardial effusion. Mitral Valve: The mitral valve is normal in structure. Trivial mitral valve regurgitation. MV peak gradient, 5.8 mmHg. The mean mitral valve gradient is 3.0 mmHg. Tricuspid Valve: The tricuspid valve is normal in structure. Tricuspid valve regurgitation is mild. Aortic Valve: The aortic valve is normal in structure. Aortic valve regurgitation is not visualized. Aortic valve mean gradient measures 3.0 mmHg. Aortic valve peak gradient measures 5.6 mmHg. Aortic valve area, by VTI measures 1.84 cm. Pulmonic Valve: The pulmonic valve was normal in structure. Pulmonic valve regurgitation is not visualized. Aorta: The ascending aorta was not well visualized. IAS/Shunts: No atrial level shunt detected by color flow Doppler. Additional Comments: 3D was performed not requiring image post  processing on an independent workstation and was indeterminate.  LEFT VENTRICLE PLAX 2D LVIDd:         4.50 cm     Diastology LVIDs:         4.10 cm     LV e' medial:    8.85 cm/s LV PW:         1.30 cm     LV E/e' medial:  11.2 LV IVS:        1.20 cm     LV e' lateral:   12.20 cm/s LVOT diam:     2.00 cm     LV E/e' lateral: 8.1 LV SV:  41 LV SV Index:   24 LVOT Area:     3.14 cm  LV Volumes (MOD) LV vol d, MOD A2C: 32.3 ml LV vol d, MOD A4C: 97.0 ml LV vol s, MOD A2C: 19.2 ml LV vol s, MOD A4C: 58.5 ml LV SV MOD A2C:     13.1 ml LV SV MOD A4C:     97.0 ml LV SV MOD BP:      24.0 ml RIGHT VENTRICLE            IVC RV Basal diam:  4.30 cm    IVC diam: 2.60 cm RV Mid diam:    3.10 cm RV S prime:     9.90 cm/s TAPSE (M-mode): 1.4 cm LEFT ATRIUM             Index        RIGHT ATRIUM           Index LA diam:        4.90 cm 2.79 cm/m   RA Area:     12.70 cm LA Vol (A2C):   49.5 ml 28.22 ml/m  RA Volume:   27.20 ml  15.51 ml/m LA Vol (A4C):   43.9 ml 25.03 ml/m LA Biplane Vol: 49.8 ml 28.39 ml/m  AORTIC VALVE                    PULMONIC VALVE AV Area (Vmax):    2.01 cm     PV Vmax:        1.47 m/s AV Area (Vmean):   1.89 cm     PV Vmean:       103.000 cm/s AV Area (VTI):     1.84 cm     PV VTI:         0.279 m AV Vmax:           118.00 cm/s  PV Peak grad:   8.6 mmHg AV Vmean:          79.100 cm/s  PV Mean grad:   5.0 mmHg AV VTI:            0.225 m      RVOT Peak grad: 3 mmHg AV Peak Grad:      5.6 mmHg AV Mean Grad:      3.0 mmHg LVOT Vmax:         75.50 cm/s LVOT Vmean:        47.700 cm/s LVOT VTI:          0.132 m LVOT/AV VTI ratio: 0.59  AORTA Ao Root diam: 2.60 cm Ao Asc diam:  2.80 cm MITRAL VALVE               TRICUSPID VALVE MV Area (PHT): 3.37 cm    TR Peak grad:   26.6 mmHg MV Area VTI:   1.95 cm    TR Mean grad:   19.0 mmHg MV Peak grad:  5.8 mmHg    TR Vmax:        258.00 cm/s MV Mean grad:  3.0 mmHg    TR Vmean:       210.0 cm/s MV Vmax:       1.20 m/s MV Vmean:      82.4 cm/s   SHUNTS MV  Decel Time: 225 msec    Systemic VTI:  0.13 m MV E velocity: 99.20 cm/s  Systemic Diam: 2.00 cm  Pulmonic VTI:  0.142 m Cara JONETTA Lovelace MD Electronically signed by Cara JONETTA Lovelace MD Signature Date/Time: 06/27/2024/5:02:16 PM    Final    DG Chest Port 1 View Result Date: 06/26/2024 CLINICAL DATA:  [id number] A-fib [facility]) [id number] EXAM: PORTABLE CHEST 1 VIEW COMPARISON:  Chest x-ray 07/20/2023 FINDINGS: The heart and mediastinal contours are unchanged. Atherosclerotic plaque. No focal consolidation. Mild pulmonary edema. No pleural effusion. No pneumothorax. No acute osseous abnormality.  Sternotomy wires are intact. IMPRESSION: 1. Mild pulmonary edema. 2.  Aortic Atherosclerosis (ICD10-I70.0). Electronically Signed   By: Morgane  Naveau M.D.   On: 06/26/2024 16:09    ECHO as above.  TELEMETRY reviewed by me 06/30/2024: Atrial fibrillation, rate 70-80s  EKG reviewed by me: atrial fibrillation, LBBB rate 133 bpm EKG (07/09): SR with 1st degree AVB, rate 74 bpm  Data reviewed by me 06/30/2024: last 24h vitals tele labs imaging I/O hospitalist progress notes.  Principal Problem:   Atrial fibrillation with RVR (HCC) Active Problems:   S/P CABG (coronary artery bypass graft)   Atrial fibrillation with rapid ventricular response (HCC)   Psoriatic arthritis (HCC)   Rheumatoid arthritis (HCC)    ASSESSMENT AND PLAN:  Teresa [name] is a 83 y.o. female  with a past medical history of paroxsymal atrial fibrillation s/p PVI (05/2019) s/p DCCV (07/2023), ischemic cardiomyopathy, chronic HFrEF (35%), coronary artery disease s/p CABG (1994), hypertension, hyperlipidemia, COPD who presented to the ED on 06/26/2024 for palpitations, weakness and fatigue for past 3 days. EKG in ED with AF RVR rate 130s. Cardiology was consulted for further evaluation.   # Atrial fibrillation RVR  # Paroxsymal AF s/p PVI (2020) s/p DCCV (07/2023) Patient presents with weakness, fatigue.  Patient denies any  palpitations.  EKG in ED with AF RVR rate 130s.  Heart rate improved s/p Cardizem  drip, d/c due to low EF.  Patient with known history of AF s/p PVI and DCCV. Discussions outpatient about redoing PVI due to recurrence of AF. Patient has documented allergy to Amiodarone . Per tele in atrial fibrillation with controlled rates, 70-80s. -Monitor and replenish electrolytes for a goal K >4, Mag >2  -Continue home Eliquis  5 mg BID for stroke risk reduction. -Continue metoprolol  tartrate 50 mg BID. Will uptitrate as BP allows.  -Continue PO digoxin  0.0625 mg daily for rate control.  -Recommend avoiding Cardizem  due to patient's reduced EF.  -Patient has documented allergy to amiodarone .  Will optimize rate control and defer using rhythm management at this time. -Discussed patient's atrial fibrillation with EP (07/08).  EP does not recommend starting dofetilide at this time as this needs to be done at another hospital for closer monitoring of QTc.  Recommended outpatient follow-up for repeat ablation.  # Ischemic Cardiomyopathy  # Chronic HFrEF Patient without SOB, LEE.  Patient appears near euvolemic on exam. CXR with mild pulmonary edema in setting of AF RVR. Echo this admission with reduced EF 40-45%, global hypokinesis, mild concentric LVH, trivial MR. -Continue metoprolol  as stated above.  -Continue losartan  25 mg daily for now. Will plan to resume home Entresto  24-26 mg BID as BP allows, this can also be done outpatient.  -Continue dapagliflozin  10 mg daily. (Copay $0) -Continuspironolactone  25 mg daily. -Will plan to optimize GDMT as BP and renal function allow.  # Coronary artery disease s/p CABG (1994) # Hypertension # Hyperlipidemia  Patient without chest pain. Trops 30 > 35. EKG in ED with atrial fibrillation, LBBB rate 133 bpm (known LBBB).  -Continue  home rosuvastatin  5 mg daily.  -Continue metoprolol , losartan  as stated above. -Minimally elevated and flat trops in setting of AF RVR is  most consistent with demand/supply mismatch and not ACS   No further cardiac recommendations.  Will sign off. Ok for discharge today from a cardiac perspective. Will arrange for follow up in clinic with [facility], Ashley Keepers, NP in 1-2 weeks. Also recommend follow-up appointment with [facility] as well.  This patient's plan of care was discussed and created with Dr. Wilburn and he is in agreement.  Signed: Dorene Comfort, PA-C  06/30/2024, 7:21 AM [facility]

## 2024-06-30 NOTE — Progress Notes (Signed)
 Physical Therapy Treatment Patient Details Name: Teresa [name] MRN: [id number] DOB: Dec 31, 1940 Today's Date: 06/30/2024   History of Present Illness Teresa [name] is a 83 y.o. female with a history of CAD, atrial fibrillation on Eliquis , hyperlipidemia, and GERD who presents with concern for atrial fibrillation.    PT Comments  Pt received upright in bed agreeable to PT. Daughter present. Plan to d/c home this afternoon.  Pt exiting bed mod-I and standing to RW at supervision with safe hand placement. Pt easily completing > 60' of gait today at supervision and improved gait quality compared to prior sessions. Pt returns supine in bed mod-I with HR stable throughout ambulation. Time spent educating pt and daughter on energy conservation techniques and use of AD in home to reduce falls risk and to assist in improving endurance for standing/walking ADL's. Both understanding. Pt with all needs in reach with d/c recs remaining appropriate.     If plan is discharge home, recommend the following: A little help with walking and/or transfers;A little help with bathing/dressing/bathroom;Assistance with cooking/housework;Assist for transportation;Help with stairs or ramp for entrance   Can travel by private vehicle        Equipment Recommendations  None recommended by PT    Recommendations for Other Services       Precautions / Restrictions Precautions Precautions: Fall Precaution/Restrictions Comments: monitor HR Restrictions Weight Bearing Restrictions Per Provider Order: No     Mobility  Bed Mobility Overal bed mobility: Modified Independent Bed Mobility: Supine to Sit, Sit to Supine             Patient Response: Cooperative  Transfers Overall transfer level: Needs assistance Equipment used: Rolling walker (2 wheels) Transfers: Sit to/from Stand Sit to Stand: Supervision                Ambulation/Gait Ambulation/Gait assistance: Supervision Gait Distance (Feet): 62  Feet Assistive device: Rolling walker (2 wheels) Gait Pattern/deviations: Step-through pattern, Decreased step length - right, Decreased step length - left       General Gait Details: completes > 60' at supervision level reporting improved tolerance compared to last session   Stairs             Wheelchair Mobility     Tilt Bed Tilt Bed Patient Response: Cooperative  Modified Rankin (Stroke Patients Only)       Balance Overall balance assessment: Needs assistance Sitting-balance support: No upper extremity supported, Feet supported Sitting balance-Leahy Scale: Good       Standing balance-Leahy Scale: Fair                              Hotel manager: No apparent difficulties  Cognition Arousal: Alert Behavior During Therapy: WFL for tasks assessed/performed                             Following commands: Intact      Cueing Cueing Techniques: Verbal cues  Exercises      General Comments        Pertinent Vitals/Pain Pain Assessment Pain Assessment: No/denies pain    Home Living                          Prior Function            PT Goals (current goals can now be found in the care plan  section) Acute Rehab PT Goals Patient Stated Goal: to feel better PT Goal Formulation: With patient Time For Goal Achievement: 07/11/24 Potential to Achieve Goals: Good Progress towards PT goals: Progressing toward goals    Frequency    Min 2X/week      PT Plan      Co-evaluation              AM-PAC PT 6 Clicks Mobility   Outcome Measure  Help needed turning from your back to your side while in a flat bed without using bedrails?: A Little Help needed moving from lying on your back to sitting on the side of a flat bed without using bedrails?: A Little Help needed moving to and from a bed to a chair (including a wheelchair)?: A Little Help needed standing up from a chair using your  arms (e.g., wheelchair or bedside chair)?: A Little Help needed to walk in hospital room?: A Little Help needed climbing 3-5 steps with a railing? : A Little 6 Click Score: 18    End of Session Equipment Utilized During Treatment: Gait belt Activity Tolerance: Patient tolerated treatment well Patient left: with bed alarm set;in bed Nurse Communication: Mobility status PT Visit Diagnosis: Difficulty in walking, not elsewhere classified (R26.2);History of falling (Z91.81);Muscle weakness (generalized) (M62.81)     Time: 8897-8872 PT Time Calculation (min) (ACUTE ONLY): 25 min  Charges:    $Therapeutic Activity: 8-22 mins $Self Care/Home Management: 8-22 PT General Charges $$ ACUTE PT VISIT: 1 Visit                    Dorina HERO. Fairly IV, PT, DPT Physical Therapist- [place]  [facility] 06/30/2024, 11:39 AM

## 2024-06-30 NOTE — Discharge Summary (Signed)
 Physician Discharge Summary   Patient: Teresa [name] MRN: [id number] DOB: [birth year]  Admit date:     06/26/2024  Discharge date: 06/30/24  Discharge Physician: Amaryllis Dare   PCP: Bertrum Charlie CROME, MD   Recommendations at discharge:  Please obtain CBC and BMP on follow-up Follow-up with cardiology Follow-up with primary care provider  Discharge Diagnoses: Principal Problem:   Atrial fibrillation with RVR (HCC) Active Problems:   Rheumatoid arthritis (HCC)   S/P CABG (coronary artery bypass graft)   Atrial fibrillation with rapid ventricular response (HCC)   Psoriatic arthritis [facility])   Hospital Course: Teresa [name]  is a 83 y.o. female,with ICM, LVEF (35-40%), CAD s/p CABG 1994 with LIMA-LAD, HTN, Hyperlipidemia, AF s/p PVI who presented to ED secondary to complaints of palpitation, weakness and fatigue over the last 2 to 3 days, patient reports some history of heart racing/palpitation,  She was found to be in A-fib with RVR.  Initially started on Cardizem  infusion.  Patient was weaned off from Cardizem  infusion and started on digoxin .  Home Toprol  dose was increased to 50 mg twice daily.  Cardizem  was discontinued due to reduced EF.  Avoiding amiodarone  due to allergy.  She will continue home Eliquis .  Patient will follow-up with her own cardiologist and EP on discharge for further assistance.  Heart rate seems well-controlled, and appears regular before discharge.  Our physical therapist recommended home health which was declined by patient and family.  Patient will be continued on current medications and follow-up with her providers for further assistance.  Assessment and Plan:  A-fib with RVR -Heart rate in the 140s upon presentation, she reports palpitation and fatigue, she does report compliance with medications, no clear why she went into with RVR. - She was started on Cardizem  drip, and home dose Toprol  increased from 12.5 mg to 25 mg.  Cardio consulted on  admission. --weaned off dilt gtt overnight, however, HR went up again in next evening.  Started on digoxin  with IV load. --cont lopressor  50 mg BID --cont oral digoxin  0.0625 mg daily --avoid dilt due to reduced EF, avoid amiodarone  due to allergy. 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  Current Echo with improved LVEF 40-45%. --s/p IV lasix  40 x1 --cont losartan  --cont Farxiga  --start spironolactone  25 mg daily --cardio plans to optimize GDMT as BP and renal function allow.    Elevated troponins - Her troponins are borderline elevated, this is likely in setting of demand ischemia from her A-fib with RVR   Hyperlipidemia - Continue with statin   COPD --stable   Rheumatoid arthritis/Psoriatic arthritis  arthritis - Resume Otezla  after discharge   History of dementia - cont home Aricept  and Namenda    Consultants: Cardiology Procedures performed: None Disposition: Home Diet recommendation:  Discharge Diet Orders (From admission, onward)     Start     Ordered   06/30/24 0000  Diet - low sodium heart healthy        06/30/24 1053           Cardiac diet DISCHARGE MEDICATION: Allergies as of 06/30/2024       Reactions   Amiodarone  Nausea And Vomiting, Cough   Sulfa Antibiotics Hives   Ivp Dye [iodinated Contrast Media] Swelling   Codeine Other (See Comments)   ALTERED MENTAL STATUS        Medication List  STOP taking these medications    Entresto  24-26 MG Generic drug: sacubitril -valsartan    metoprolol  succinate 25 MG 24 hr tablet Commonly known as: TOPROL -XL       TAKE these medications    apixaban  5 MG Tabs tablet Commonly known as: ELIQUIS  Take 1 tablet (5 mg total) by mouth 2 (two) times daily.   cetirizine  10 MG tablet Commonly known as: ZYRTEC  TAKE 1 TABLET BY MOUTH EVERY DAY   cyanocobalamin  1000 MCG tablet Commonly  known as: VITAMIN B12 Take 1,000 mcg by mouth daily.   dapagliflozin  propanediol 10 MG Tabs tablet Commonly known as: FARXIGA  Take 1 tablet (10 mg total) by mouth daily.   digoxin  0.125 MG tablet Commonly known as: LANOXIN  Take 0.5 tablets (0.0625 mg total) by mouth daily.   donepezil  5 MG tablet Commonly known as: ARICEPT  TAKE 1 TABLET BY MOUTH EVERYDAY AT BEDTIME   fluticasone  50 MCG/ACT nasal spray Commonly known as: FLONASE  PLACE 2 SPRAYS INTO BOTH NOSTRILS DAILY AS NEEDED FOR ALLERGIES.   gabapentin  300 MG capsule Commonly known as: NEURONTIN  1 CAPSULE IN THE MORNING, 2 CAPSULES IN THE EVENING   losartan  25 MG tablet Commonly known as: COZAAR  Take 1 tablet (25 mg total) by mouth daily.   memantine  10 MG tablet Commonly known as: NAMENDA  Take 10 mg by mouth in the morning and at bedtime.   metoprolol  tartrate 50 MG tablet Commonly known as: LOPRESSOR  Take 1 tablet (50 mg total) by mouth 2 (two) times daily.   multivitamin with minerals Tabs tablet Take 1 tablet by mouth daily.   Otezla  30 MG Tabs Generic drug: Apremilast  Take 30 mg by mouth in the morning and at bedtime.   rosuvastatin  5 MG tablet Commonly known as: CRESTOR  TAKE 1 TABLET BY MOUTH EVERY DAY   spironolactone  25 MG tablet Commonly known as: ALDACTONE  Take 1 tablet (25 mg total) by mouth daily.        Follow-up Information     Burrow, Ashley SAILOR, FNP. Go on 07/14/2024.   Specialty: Cardiology Why: Hospital Follow up: July 24th @ 8:00 AM. Reesa information: [address] [place] [address] [phone]         Bertrum Charlie CROME, MD. Schedule an appointment as soon as possible for a visit on 07/06/2024.   Specialty: Family Medicine Why: Hospital Follow up: July 16th @ 10 AM with Nancyann Perry. Please bring all medications, insurance card, and valid ID Contact information: [address] [place] [address] [place] [address] [phone]                 Discharge Exam: Teresa [name]   06/26/24 1415  Weight: 70.2 kg   General.  Frail elderly lady, in no acute distress. Pulmonary.  Lungs clear bilaterally, normal respiratory effort. CV.  Regular rate and rhythm, no JVD, rub or murmur. Abdomen.  Soft, nontender, nondistended, BS positive. CNS.  Alert and oriented .  No focal neurologic deficit. Extremities.  No edema, no cyanosis, pulses intact and symmetrical.  Condition at discharge: stable  The results of significant diagnostics from this hospitalization (including imaging, microbiology, ancillary and laboratory) are listed below for reference.   Imaging Studies: ECHOCARDIOGRAM COMPLETE Result Date: 06/27/2024    ECHOCARDIOGRAM REPORT   Patient Name:   Teresa [name] [facility] Date of Exam: 06/27/2024 Medical Rec #:  [id number]       Height:       64.0 in Accession #:    [id number]  Weight:       154.7 lb Date of Birth:  1941/12/11       BSA:          1.754 m Patient Age:    82 years        BP:           107/47 mmHg Patient Gender: F               HR:           100 bpm. Exam Location:  [facility] Procedure: 2D Echo, Cardiac Doppler, Color Doppler and Intracardiac            Opacification Agent (Both Spectral and Color Flow Doppler were            utilized during procedure). Indications:     Atrial Fibrillation I48.91  History:         Patient has prior history of Echocardiogram examinations, most                  recent 04/07/2018. Arrythmias:Atrial Fibrillation.  Sonographer:     Ashley McNeely-Sloane Referring Phys:  [id number] DAWO[address] GORMAN LYE Diagnosing Phys: Cara JONETTA Lovelace MD IMPRESSIONS  1. Left ventricular ejection fraction, by estimation, is 40 to 45%. The left ventricle has mildly decreased function. The left ventricle demonstrates global hypokinesis. There is mild concentric left ventricular hypertrophy. Left ventricular diastolic parameters are consistent with Grade I diastolic dysfunction (impaired relaxation).  2. Right ventricular systolic  function is normal. The right ventricular size is normal.  3. Left atrial size was moderately dilated.  4. Right atrial size was mildly dilated.  5. The mitral valve is normal in structure. Trivial mitral valve regurgitation.  6. The aortic valve is normal in structure. Aortic valve regurgitation is not visualized. FINDINGS  Left Ventricle: Left ventricular ejection fraction, by estimation, is 40 to 45%. The left ventricle has mildly decreased function. The left ventricle demonstrates global hypokinesis. Definity  contrast agent was given IV to delineate the left ventricular  endocardial borders. Strain was performed and the global longitudinal strain is indeterminate. Global longitudinal strain performed but not reported based on interpreter judgement due to suboptimal tracking. The left ventricular internal cavity size was  normal in size. There is mild concentric left ventricular hypertrophy. Left ventricular diastolic parameters are consistent with Grade I diastolic dysfunction (impaired relaxation). Right Ventricle: The right ventricular size is normal. No increase in right ventricular wall thickness. Right ventricular systolic function is normal. Left Atrium: Left atrial size was moderately dilated. Right Atrium: Right atrial size was mildly dilated. Pericardium: There is no evidence of pericardial effusion. Mitral Valve: The mitral valve is normal in structure. Trivial mitral valve regurgitation. MV peak gradient, 5.8 mmHg. The mean mitral valve gradient is 3.0 mmHg. Tricuspid Valve: The tricuspid valve is normal in structure. Tricuspid valve regurgitation is mild. Aortic Valve: The aortic valve is normal in structure. Aortic valve regurgitation is not visualized. Aortic valve mean gradient measures 3.0 mmHg. Aortic valve peak gradient measures 5.6 mmHg. Aortic valve area, by VTI measures 1.84 cm. Pulmonic Valve: The pulmonic valve was normal in structure. Pulmonic valve regurgitation is not visualized.  Aorta: The ascending aorta was not well visualized. IAS/Shunts: No atrial level shunt detected by color flow Doppler. Additional Comments: 3D was performed not requiring image post processing on an independent workstation and was indeterminate.  LEFT VENTRICLE PLAX 2D LVIDd:         4.50 cm     Diastology  LVIDs:         4.10 cm     LV e' medial:    8.85 cm/s LV PW:         1.30 cm     LV E/e' medial:  11.2 LV IVS:        1.20 cm     LV e' lateral:   12.20 cm/s LVOT diam:     2.00 cm     LV E/e' lateral: 8.1 LV SV:         41 LV SV Index:   24 LVOT Area:     3.14 cm  LV Volumes (MOD) LV vol d, MOD A2C: 32.3 ml LV vol d, MOD A4C: 97.0 ml LV vol s, MOD A2C: 19.2 ml LV vol s, MOD A4C: 58.5 ml LV SV MOD A2C:     13.1 ml LV SV MOD A4C:     97.0 ml LV SV MOD BP:      24.0 ml RIGHT VENTRICLE            IVC RV Basal diam:  4.30 cm    IVC diam: 2.60 cm RV Mid diam:    3.10 cm RV S prime:     9.90 cm/s TAPSE (M-mode): 1.4 cm LEFT ATRIUM             Index        RIGHT ATRIUM           Index LA diam:        4.90 cm 2.79 cm/m   RA Area:     12.70 cm LA Vol (A2C):   49.5 ml 28.22 ml/m  RA Volume:   27.20 ml  15.51 ml/m LA Vol (A4C):   43.9 ml 25.03 ml/m LA Biplane Vol: 49.8 ml 28.39 ml/m  AORTIC VALVE                    PULMONIC VALVE AV Area (Vmax):    2.01 cm     PV Vmax:        1.47 m/s AV Area (Vmean):   1.89 cm     PV Vmean:       103.000 cm/s AV Area (VTI):     1.84 cm     PV VTI:         0.279 m AV Vmax:           118.00 cm/s  PV Peak grad:   8.6 mmHg AV Vmean:          79.100 cm/s  PV Mean grad:   5.0 mmHg AV VTI:            0.225 m      RVOT Peak grad: 3 mmHg AV Peak Grad:      5.6 mmHg AV Mean Grad:      3.0 mmHg LVOT Vmax:         75.50 cm/s LVOT Vmean:        47.700 cm/s LVOT VTI:          0.132 m LVOT/AV VTI ratio: 0.59  AORTA Ao Root diam: 2.60 cm Ao Asc diam:  2.80 cm MITRAL VALVE               TRICUSPID VALVE MV Area (PHT): 3.37 cm    TR Peak grad:   26.6 mmHg MV Area VTI:   1.95 cm    TR Mean grad:    19.0 mmHg MV Peak grad:  5.8 mmHg    TR Vmax:        258.00 cm/s MV Mean grad:  3.0 mmHg    TR Vmean:       210.0 cm/s MV Vmax:       1.20 m/s MV Vmean:      82.4 cm/s   SHUNTS MV Decel Time: 225 msec    Systemic VTI:  0.13 m MV E velocity: 99.20 cm/s  Systemic Diam: 2.00 cm                            Pulmonic VTI:  0.142 m Cara JONETTA Lovelace MD Electronically signed by Cara JONETTA Lovelace MD Signature Date/Time: 06/27/2024/5:02:16 PM    Final    DG Chest Port 1 View Result Date: 06/26/2024 CLINICAL DATA:  [id number] A-fib [facility]) [id number] EXAM: PORTABLE CHEST 1 VIEW COMPARISON:  Chest x-ray 07/20/2023 FINDINGS: The heart and mediastinal contours are unchanged. Atherosclerotic plaque. No focal consolidation. Mild pulmonary edema. No pleural effusion. No pneumothorax. No acute osseous abnormality.  Sternotomy wires are intact. IMPRESSION: 1. Mild pulmonary edema. 2.  Aortic Atherosclerosis (ICD10-I70.0). Electronically Signed   By: Morgane  Naveau M.D.   On: 06/26/2024 16:09    Microbiology: Results for orders placed or performed during the hospital encounter of 07/11/21  Urine Culture     Status: Abnormal   Collection Time: 07/02/21  2:47 PM   Specimen: Urine, Random  Result Value Ref Range Status   Specimen Description   Final    URINE, RANDOM Performed at [facility], [address] [place], [place] [address]    Special Requests   Final    NONE Performed at [facility], [address] [place], [place] [address]    Culture >=100,000 COLONIES/mL ESCHERICHIA COLI (A)  Final   Report Status 07/05/2021 FINAL  Final   Organism ID, Bacteria ESCHERICHIA COLI (A)  Final      Susceptibility   Escherichia coli - MIC*    AMPICILLIN >=32 RESISTANT Resistant     CEFAZOLIN  <=4 SENSITIVE Sensitive     CEFEPIME <=0.12 SENSITIVE Sensitive     CEFTRIAXONE <=0.25 SENSITIVE Sensitive     CIPROFLOXACIN  <=0.25 SENSITIVE Sensitive     GENTAMICIN <=1 SENSITIVE Sensitive     IMIPENEM <=0.25  SENSITIVE Sensitive     NITROFURANTOIN <=16 SENSITIVE Sensitive     TRIMETH/SULFA >=320 RESISTANT Resistant     AMPICILLIN/SULBACTAM 16 INTERMEDIATE Intermediate     PIP/TAZO <=4 SENSITIVE Sensitive     * >=100,000 COLONIES/mL ESCHERICHIA COLI  Resp Panel by RT-PCR (Flu A&B, Covid) Nasopharyngeal Swab     Status: None   Collection Time: 07/14/21 11:59 AM   Specimen: Nasopharyngeal Swab; Nasopharyngeal(NP) swabs in vial transport medium  Result Value Ref Range Status   SARS Coronavirus 2 by RT PCR NEGATIVE NEGATIVE Final    Comment: (NOTE) SARS-CoV-2 target nucleic acids are NOT DETECTED.  The SARS-CoV-2 RNA is generally detectable in upper respiratory specimens during the acute phase of infection. The lowest concentration of SARS-CoV-2 viral copies this assay can detect is 138 copies/mL. A negative result does not preclude SARS-Cov-2 infection and should not be used as the sole basis for treatment or other patient management decisions. A negative result may occur with  improper specimen collection/handling, submission of specimen other than nasopharyngeal swab, presence of viral mutation(s) within the areas targeted by this assay, and inadequate number of viral copies(<138 copies/mL). A negative result must  be combined with clinical observations, patient history, and epidemiological information. The expected result is Negative.  Fact Sheet for Patients:  BloggerCourse.com  Fact Sheet for Healthcare Providers:  SeriousBroker.it  This test is no t yet approved or cleared by the [place]  FDA and  has been authorized for detection and/or diagnosis of SARS-CoV-2 by FDA under an Emergency Use Authorization (EUA). This EUA will remain  in effect (meaning this test can be used) for the duration of the COVID-19 declaration under Section 564(b)(1) of the Act, 21 U.S.C.section 360bbb-3(b)(1), unless the authorization is terminated  or  revoked sooner.       Influenza A by PCR NEGATIVE NEGATIVE Final   Influenza B by PCR NEGATIVE NEGATIVE Final    Comment: (NOTE) The Xpert Xpress SARS-CoV-2/FLU/RSV plus assay is intended as an aid in the diagnosis of influenza from Nasopharyngeal swab specimens and should not be used as a sole basis for treatment. Nasal washings and aspirates are unacceptable for Xpert Xpress SARS-CoV-2/FLU/RSV testing.  Fact Sheet for Patients: BloggerCourse.com  Fact Sheet for Healthcare Providers: SeriousBroker.it  This test is not yet approved or cleared by the [place]  FDA and has been authorized for detection and/or diagnosis of SARS-CoV-2 by FDA under an Emergency Use Authorization (EUA). This EUA will remain in effect (meaning this test can be used) for the duration of the COVID-19 declaration under Section 564(b)(1) of the Act, 21 U.S.C. section 360bbb-3(b)(1), unless the authorization is terminated or revoked.  Performed at [facility], [address] [place], [place] [address]     Labs: CBC: Recent Labs  Lab 06/26/24 1424 06/27/24 1750  WBC 5.7 7.3  HGB 14.9 14.4  HCT 46.2* 44.3  MCV 94.7 94.5  PLT 208 189   Basic Metabolic Panel: Recent Labs  Lab 06/26/24 1424 06/26/24 1624 06/27/24 1952 06/29/24 0319 06/30/24 0312  NA 141  --  140 139 140  K 4.1  --  4.1 3.6 4.2  CL 109  --  108 108 106  CO2 23  --  18* 23 21*  GLUCOSE 101*  --  105* 78 80  BUN 16  --  16 16 21   CREATININE 0.89  --  0.53 0.72 0.65  CALCIUM  9.6  --  9.0 8.9 9.1  MG  --  2.1 2.2 2.1 2.1   Liver Function Tests: No results for input(s): AST, ALT, ALKPHOS, BILITOT, PROT, ALBUMIN in the last 168 hours. CBG: No results for input(s): GLUCAP in the last 168 hours.  Discharge time spent: greater than 30 minutes.  This record has been created using Conservation officer, historic buildings. Errors have been sought and  corrected,but may not always be located. Such creation errors do not reflect on the standard of care.   Signed: Amaryllis Dare, MD [facility] 06/30/2024

## 2024-06-30 NOTE — Progress Notes (Signed)
 1140: D/C instructions, including home medications reviewed with Verneita Armour, (patient's daughter). Verneita verbalized understanding of instructions received. Verneita will meet hospital transport staff at the main entrance for discharge via daughter's vehicle. The patient will be transported via wheelchair to the hospital's main entrance. VSS. No distress.

## 2024-06-30 NOTE — TOC Transition Note (Signed)
 Transition of Care [facility]) - Discharge Note   Patient Details  Name: Teresa [name] MRN: [id number] Date of Birth: 1941/05/27  [facility] CM/SW Contact:  Lauraine JAYSON Carpen, LCSW Phone Number: 06/30/2024, 12:56 PM   Clinical Narrative:   Patient has orders to discharge home today. Per RN, daughter is inquiring about home health services. CSW met with patient and daughter and provided CMS list for agencies that serve her zip code. First preference is [facility]. They have accepted referral for PT. No further concerns. CSW signing off.  Final next level of care: Home w Home Health Services Barriers to Discharge: Barriers Resolved   Patient Goals and CMS Choice   CMS Medicare.gov Compare Post Acute Care list provided to:: Patient (and daughter) Choice offered to / list presented to : Patient, Adult Children      Discharge Placement                Patient to be transferred to facility by: Daughter Name of family member notified: Odalys Win Patient and family notified of of transfer: 06/30/24  Discharge Plan and Services Additional resources added to the After Visit Summary for       Post Acute Care Choice: NA                    HH Arranged: PT HH Agency: [facility] Date [facility] Agency Contacted: 06/30/24   Representative spoke with at [facility] Agency: Larraine  Social Drivers of Health (SDOH) Interventions SDOH Screenings   Food Insecurity: No Food Insecurity (06/29/2024)  Housing: Low Risk  (06/29/2024)  Transportation Needs: No Transportation Needs (06/29/2024)  Utilities: Not At Risk (06/29/2024)  Alcohol  Screen: Low Risk  (07/02/2022)  Depression (PHQ2-9): Low Risk  (07/02/2022)  Financial Resource Strain: Low Risk  (06/03/2023)   Received from [facility]  Physical Activity: Insufficiently Active (06/03/2023)   Received from [facility]  Social Connections: Moderately Isolated (06/29/2024)  Stress: No Stress Concern Present  (06/03/2023)   Received from [facility]  Tobacco Use: Medium Risk (06/26/2024)  Health Literacy: Adequate Health Literacy (06/03/2023)   Received from [facility]     Readmission Risk Interventions     No data to display

## 2024-07-06 ENCOUNTER — Inpatient Hospital Stay: Admitting: Family Medicine

## 2024-07-06 DIAGNOSIS — I4819 Other persistent atrial fibrillation: Secondary | ICD-10-CM | POA: Diagnosis not present

## 2024-07-06 DIAGNOSIS — I272 Pulmonary hypertension, unspecified: Secondary | ICD-10-CM | POA: Diagnosis not present

## 2024-07-06 DIAGNOSIS — M51369 Other intervertebral disc degeneration, lumbar region without mention of lumbar back pain or lower extremity pain: Secondary | ICD-10-CM | POA: Diagnosis not present

## 2024-07-06 DIAGNOSIS — Z5982 Transportation insecurity: Secondary | ICD-10-CM | POA: Diagnosis not present

## 2024-07-06 DIAGNOSIS — I471 Supraventricular tachycardia, unspecified: Secondary | ICD-10-CM | POA: Diagnosis not present

## 2024-07-06 DIAGNOSIS — I255 Ischemic cardiomyopathy: Secondary | ICD-10-CM | POA: Diagnosis not present

## 2024-07-06 DIAGNOSIS — Z7901 Long term (current) use of anticoagulants: Secondary | ICD-10-CM | POA: Diagnosis not present

## 2024-07-06 DIAGNOSIS — M069 Rheumatoid arthritis, unspecified: Secondary | ICD-10-CM | POA: Diagnosis not present

## 2024-07-06 DIAGNOSIS — I110 Hypertensive heart disease with heart failure: Secondary | ICD-10-CM | POA: Diagnosis not present

## 2024-07-06 DIAGNOSIS — Z951 Presence of aortocoronary bypass graft: Secondary | ICD-10-CM | POA: Diagnosis not present

## 2024-07-06 DIAGNOSIS — I447 Left bundle-branch block, unspecified: Secondary | ICD-10-CM | POA: Diagnosis not present

## 2024-07-06 DIAGNOSIS — I25110 Atherosclerotic heart disease of native coronary artery with unstable angina pectoris: Secondary | ICD-10-CM | POA: Diagnosis not present

## 2024-07-06 DIAGNOSIS — I252 Old myocardial infarction: Secondary | ICD-10-CM | POA: Diagnosis not present

## 2024-07-06 DIAGNOSIS — M1990 Unspecified osteoarthritis, unspecified site: Secondary | ICD-10-CM | POA: Diagnosis not present

## 2024-07-06 DIAGNOSIS — E785 Hyperlipidemia, unspecified: Secondary | ICD-10-CM | POA: Diagnosis not present

## 2024-07-06 DIAGNOSIS — I051 Rheumatic mitral insufficiency: Secondary | ICD-10-CM | POA: Diagnosis not present

## 2024-07-06 DIAGNOSIS — J4489 Other specified chronic obstructive pulmonary disease: Secondary | ICD-10-CM | POA: Diagnosis not present

## 2024-07-06 DIAGNOSIS — F0390 Unspecified dementia without behavioral disturbance: Secondary | ICD-10-CM | POA: Diagnosis not present

## 2024-07-06 DIAGNOSIS — K219 Gastro-esophageal reflux disease without esophagitis: Secondary | ICD-10-CM | POA: Diagnosis not present

## 2024-07-06 DIAGNOSIS — I5042 Chronic combined systolic (congestive) and diastolic (congestive) heart failure: Secondary | ICD-10-CM | POA: Diagnosis not present

## 2024-07-06 DIAGNOSIS — R7989 Other specified abnormal findings of blood chemistry: Secondary | ICD-10-CM | POA: Diagnosis not present

## 2024-07-06 DIAGNOSIS — Z7984 Long term (current) use of oral hypoglycemic drugs: Secondary | ICD-10-CM | POA: Diagnosis not present

## 2024-07-06 DIAGNOSIS — I428 Other cardiomyopathies: Secondary | ICD-10-CM | POA: Diagnosis not present

## 2024-07-06 DIAGNOSIS — L4050 Arthropathic psoriasis, unspecified: Secondary | ICD-10-CM | POA: Diagnosis not present

## 2024-07-06 DIAGNOSIS — L409 Psoriasis, unspecified: Secondary | ICD-10-CM | POA: Diagnosis not present

## 2024-07-11 DIAGNOSIS — J4489 Other specified chronic obstructive pulmonary disease: Secondary | ICD-10-CM | POA: Diagnosis not present

## 2024-07-11 DIAGNOSIS — I5042 Chronic combined systolic (congestive) and diastolic (congestive) heart failure: Secondary | ICD-10-CM | POA: Diagnosis not present

## 2024-07-11 DIAGNOSIS — I428 Other cardiomyopathies: Secondary | ICD-10-CM | POA: Diagnosis not present

## 2024-07-11 DIAGNOSIS — I051 Rheumatic mitral insufficiency: Secondary | ICD-10-CM | POA: Diagnosis not present

## 2024-07-11 DIAGNOSIS — Z951 Presence of aortocoronary bypass graft: Secondary | ICD-10-CM | POA: Diagnosis not present

## 2024-07-11 DIAGNOSIS — I110 Hypertensive heart disease with heart failure: Secondary | ICD-10-CM | POA: Diagnosis not present

## 2024-07-11 DIAGNOSIS — M069 Rheumatoid arthritis, unspecified: Secondary | ICD-10-CM | POA: Diagnosis not present

## 2024-07-11 DIAGNOSIS — M1990 Unspecified osteoarthritis, unspecified site: Secondary | ICD-10-CM | POA: Diagnosis not present

## 2024-07-11 DIAGNOSIS — Z7984 Long term (current) use of oral hypoglycemic drugs: Secondary | ICD-10-CM | POA: Diagnosis not present

## 2024-07-11 DIAGNOSIS — I471 Supraventricular tachycardia, unspecified: Secondary | ICD-10-CM | POA: Diagnosis not present

## 2024-07-11 DIAGNOSIS — I4819 Other persistent atrial fibrillation: Secondary | ICD-10-CM | POA: Diagnosis not present

## 2024-07-11 DIAGNOSIS — I272 Pulmonary hypertension, unspecified: Secondary | ICD-10-CM | POA: Diagnosis not present

## 2024-07-11 DIAGNOSIS — I252 Old myocardial infarction: Secondary | ICD-10-CM | POA: Diagnosis not present

## 2024-07-11 DIAGNOSIS — F0390 Unspecified dementia without behavioral disturbance: Secondary | ICD-10-CM | POA: Diagnosis not present

## 2024-07-11 DIAGNOSIS — L409 Psoriasis, unspecified: Secondary | ICD-10-CM | POA: Diagnosis not present

## 2024-07-11 DIAGNOSIS — Z5982 Transportation insecurity: Secondary | ICD-10-CM | POA: Diagnosis not present

## 2024-07-11 DIAGNOSIS — R7989 Other specified abnormal findings of blood chemistry: Secondary | ICD-10-CM | POA: Diagnosis not present

## 2024-07-11 DIAGNOSIS — I447 Left bundle-branch block, unspecified: Secondary | ICD-10-CM | POA: Diagnosis not present

## 2024-07-11 DIAGNOSIS — Z7901 Long term (current) use of anticoagulants: Secondary | ICD-10-CM | POA: Diagnosis not present

## 2024-07-11 DIAGNOSIS — I255 Ischemic cardiomyopathy: Secondary | ICD-10-CM | POA: Diagnosis not present

## 2024-07-11 DIAGNOSIS — K219 Gastro-esophageal reflux disease without esophagitis: Secondary | ICD-10-CM | POA: Diagnosis not present

## 2024-07-11 DIAGNOSIS — M51369 Other intervertebral disc degeneration, lumbar region without mention of lumbar back pain or lower extremity pain: Secondary | ICD-10-CM | POA: Diagnosis not present

## 2024-07-11 DIAGNOSIS — E785 Hyperlipidemia, unspecified: Secondary | ICD-10-CM | POA: Diagnosis not present

## 2024-07-11 DIAGNOSIS — I25110 Atherosclerotic heart disease of native coronary artery with unstable angina pectoris: Secondary | ICD-10-CM | POA: Diagnosis not present

## 2024-07-11 DIAGNOSIS — L4050 Arthropathic psoriasis, unspecified: Secondary | ICD-10-CM | POA: Diagnosis not present

## 2024-07-20 DIAGNOSIS — Z9889 Other specified postprocedural states: Secondary | ICD-10-CM | POA: Diagnosis not present

## 2024-07-20 DIAGNOSIS — Z7901 Long term (current) use of anticoagulants: Secondary | ICD-10-CM | POA: Diagnosis not present

## 2024-07-20 DIAGNOSIS — I5022 Chronic systolic (congestive) heart failure: Secondary | ICD-10-CM | POA: Diagnosis not present

## 2024-07-20 DIAGNOSIS — I447 Left bundle-branch block, unspecified: Secondary | ICD-10-CM | POA: Diagnosis not present

## 2024-07-20 DIAGNOSIS — I480 Paroxysmal atrial fibrillation: Secondary | ICD-10-CM | POA: Diagnosis not present

## 2024-07-21 DIAGNOSIS — G309 Alzheimer's disease, unspecified: Secondary | ICD-10-CM | POA: Diagnosis not present

## 2024-07-21 DIAGNOSIS — Z111 Encounter for screening for respiratory tuberculosis: Secondary | ICD-10-CM | POA: Diagnosis not present

## 2024-07-21 DIAGNOSIS — F0280 Dementia in other diseases classified elsewhere without behavioral disturbance: Secondary | ICD-10-CM | POA: Diagnosis not present

## 2024-07-25 DIAGNOSIS — I4891 Unspecified atrial fibrillation: Secondary | ICD-10-CM | POA: Diagnosis not present

## 2024-07-25 DIAGNOSIS — R399 Unspecified symptoms and signs involving the genitourinary system: Secondary | ICD-10-CM | POA: Diagnosis not present

## 2024-07-25 DIAGNOSIS — F0390 Unspecified dementia without behavioral disturbance: Secondary | ICD-10-CM | POA: Diagnosis not present

## 2024-07-25 DIAGNOSIS — I5030 Unspecified diastolic (congestive) heart failure: Secondary | ICD-10-CM | POA: Diagnosis not present

## 2024-07-25 DIAGNOSIS — R6339 Other feeding difficulties: Secondary | ICD-10-CM | POA: Diagnosis not present

## 2024-07-25 DIAGNOSIS — N390 Urinary tract infection, site not specified: Secondary | ICD-10-CM | POA: Diagnosis not present

## 2024-07-25 DIAGNOSIS — I959 Hypotension, unspecified: Secondary | ICD-10-CM | POA: Diagnosis not present

## 2024-07-28 DIAGNOSIS — L4050 Arthropathic psoriasis, unspecified: Secondary | ICD-10-CM | POA: Diagnosis not present

## 2024-07-28 DIAGNOSIS — I5042 Chronic combined systolic (congestive) and diastolic (congestive) heart failure: Secondary | ICD-10-CM | POA: Diagnosis not present

## 2024-07-28 DIAGNOSIS — I255 Ischemic cardiomyopathy: Secondary | ICD-10-CM | POA: Diagnosis not present

## 2024-07-28 DIAGNOSIS — Z951 Presence of aortocoronary bypass graft: Secondary | ICD-10-CM | POA: Diagnosis not present

## 2024-07-28 DIAGNOSIS — M51369 Other intervertebral disc degeneration, lumbar region without mention of lumbar back pain or lower extremity pain: Secondary | ICD-10-CM | POA: Diagnosis not present

## 2024-07-28 DIAGNOSIS — Z7984 Long term (current) use of oral hypoglycemic drugs: Secondary | ICD-10-CM | POA: Diagnosis not present

## 2024-07-28 DIAGNOSIS — I428 Other cardiomyopathies: Secondary | ICD-10-CM | POA: Diagnosis not present

## 2024-07-28 DIAGNOSIS — R7989 Other specified abnormal findings of blood chemistry: Secondary | ICD-10-CM | POA: Diagnosis not present

## 2024-07-28 DIAGNOSIS — M069 Rheumatoid arthritis, unspecified: Secondary | ICD-10-CM | POA: Diagnosis not present

## 2024-07-28 DIAGNOSIS — I252 Old myocardial infarction: Secondary | ICD-10-CM | POA: Diagnosis not present

## 2024-07-28 DIAGNOSIS — L409 Psoriasis, unspecified: Secondary | ICD-10-CM | POA: Diagnosis not present

## 2024-07-28 DIAGNOSIS — I4819 Other persistent atrial fibrillation: Secondary | ICD-10-CM | POA: Diagnosis not present

## 2024-07-28 DIAGNOSIS — Z7901 Long term (current) use of anticoagulants: Secondary | ICD-10-CM | POA: Diagnosis not present

## 2024-07-28 DIAGNOSIS — I25110 Atherosclerotic heart disease of native coronary artery with unstable angina pectoris: Secondary | ICD-10-CM | POA: Diagnosis not present

## 2024-07-28 DIAGNOSIS — F0390 Unspecified dementia without behavioral disturbance: Secondary | ICD-10-CM | POA: Diagnosis not present

## 2024-07-28 DIAGNOSIS — K219 Gastro-esophageal reflux disease without esophagitis: Secondary | ICD-10-CM | POA: Diagnosis not present

## 2024-07-28 DIAGNOSIS — I447 Left bundle-branch block, unspecified: Secondary | ICD-10-CM | POA: Diagnosis not present

## 2024-07-28 DIAGNOSIS — I110 Hypertensive heart disease with heart failure: Secondary | ICD-10-CM | POA: Diagnosis not present

## 2024-07-28 DIAGNOSIS — M1990 Unspecified osteoarthritis, unspecified site: Secondary | ICD-10-CM | POA: Diagnosis not present

## 2024-07-28 DIAGNOSIS — J4489 Other specified chronic obstructive pulmonary disease: Secondary | ICD-10-CM | POA: Diagnosis not present

## 2024-07-28 DIAGNOSIS — E785 Hyperlipidemia, unspecified: Secondary | ICD-10-CM | POA: Diagnosis not present

## 2024-07-28 DIAGNOSIS — I471 Supraventricular tachycardia, unspecified: Secondary | ICD-10-CM | POA: Diagnosis not present

## 2024-07-28 DIAGNOSIS — I051 Rheumatic mitral insufficiency: Secondary | ICD-10-CM | POA: Diagnosis not present

## 2024-07-28 DIAGNOSIS — Z5982 Transportation insecurity: Secondary | ICD-10-CM | POA: Diagnosis not present

## 2024-07-28 DIAGNOSIS — I272 Pulmonary hypertension, unspecified: Secondary | ICD-10-CM | POA: Diagnosis not present

## 2024-07-29 DIAGNOSIS — R9431 Abnormal electrocardiogram [ECG] [EKG]: Secondary | ICD-10-CM | POA: Diagnosis not present

## 2024-07-29 DIAGNOSIS — I447 Left bundle-branch block, unspecified: Secondary | ICD-10-CM | POA: Diagnosis not present

## 2024-07-29 DIAGNOSIS — R001 Bradycardia, unspecified: Secondary | ICD-10-CM | POA: Diagnosis not present

## 2024-07-29 DIAGNOSIS — I440 Atrioventricular block, first degree: Secondary | ICD-10-CM | POA: Diagnosis not present

## 2024-07-29 DIAGNOSIS — I480 Paroxysmal atrial fibrillation: Secondary | ICD-10-CM | POA: Diagnosis not present

## 2024-07-29 DIAGNOSIS — Z87891 Personal history of nicotine dependence: Secondary | ICD-10-CM | POA: Diagnosis not present

## 2024-08-01 DIAGNOSIS — I4819 Other persistent atrial fibrillation: Secondary | ICD-10-CM | POA: Diagnosis not present

## 2024-08-01 DIAGNOSIS — Z7901 Long term (current) use of anticoagulants: Secondary | ICD-10-CM | POA: Diagnosis not present

## 2024-08-01 DIAGNOSIS — M1990 Unspecified osteoarthritis, unspecified site: Secondary | ICD-10-CM | POA: Diagnosis not present

## 2024-08-01 DIAGNOSIS — I255 Ischemic cardiomyopathy: Secondary | ICD-10-CM | POA: Diagnosis not present

## 2024-08-01 DIAGNOSIS — I252 Old myocardial infarction: Secondary | ICD-10-CM | POA: Diagnosis not present

## 2024-08-01 DIAGNOSIS — I471 Supraventricular tachycardia, unspecified: Secondary | ICD-10-CM | POA: Diagnosis not present

## 2024-08-01 DIAGNOSIS — I110 Hypertensive heart disease with heart failure: Secondary | ICD-10-CM | POA: Diagnosis not present

## 2024-08-01 DIAGNOSIS — L409 Psoriasis, unspecified: Secondary | ICD-10-CM | POA: Diagnosis not present

## 2024-08-01 DIAGNOSIS — I25110 Atherosclerotic heart disease of native coronary artery with unstable angina pectoris: Secondary | ICD-10-CM | POA: Diagnosis not present

## 2024-08-01 DIAGNOSIS — I447 Left bundle-branch block, unspecified: Secondary | ICD-10-CM | POA: Diagnosis not present

## 2024-08-01 DIAGNOSIS — M069 Rheumatoid arthritis, unspecified: Secondary | ICD-10-CM | POA: Diagnosis not present

## 2024-08-01 DIAGNOSIS — I5042 Chronic combined systolic (congestive) and diastolic (congestive) heart failure: Secondary | ICD-10-CM | POA: Diagnosis not present

## 2024-08-01 DIAGNOSIS — Z951 Presence of aortocoronary bypass graft: Secondary | ICD-10-CM | POA: Diagnosis not present

## 2024-08-01 DIAGNOSIS — I272 Pulmonary hypertension, unspecified: Secondary | ICD-10-CM | POA: Diagnosis not present

## 2024-08-01 DIAGNOSIS — L4050 Arthropathic psoriasis, unspecified: Secondary | ICD-10-CM | POA: Diagnosis not present

## 2024-08-01 DIAGNOSIS — I428 Other cardiomyopathies: Secondary | ICD-10-CM | POA: Diagnosis not present

## 2024-08-01 DIAGNOSIS — R7989 Other specified abnormal findings of blood chemistry: Secondary | ICD-10-CM | POA: Diagnosis not present

## 2024-08-01 DIAGNOSIS — E785 Hyperlipidemia, unspecified: Secondary | ICD-10-CM | POA: Diagnosis not present

## 2024-08-01 DIAGNOSIS — F0390 Unspecified dementia without behavioral disturbance: Secondary | ICD-10-CM | POA: Diagnosis not present

## 2024-08-01 DIAGNOSIS — K219 Gastro-esophageal reflux disease without esophagitis: Secondary | ICD-10-CM | POA: Diagnosis not present

## 2024-08-01 DIAGNOSIS — Z5982 Transportation insecurity: Secondary | ICD-10-CM | POA: Diagnosis not present

## 2024-08-01 DIAGNOSIS — I051 Rheumatic mitral insufficiency: Secondary | ICD-10-CM | POA: Diagnosis not present

## 2024-08-01 DIAGNOSIS — M51369 Other intervertebral disc degeneration, lumbar region without mention of lumbar back pain or lower extremity pain: Secondary | ICD-10-CM | POA: Diagnosis not present

## 2024-08-01 DIAGNOSIS — J4489 Other specified chronic obstructive pulmonary disease: Secondary | ICD-10-CM | POA: Diagnosis not present

## 2024-08-01 DIAGNOSIS — Z7984 Long term (current) use of oral hypoglycemic drugs: Secondary | ICD-10-CM | POA: Diagnosis not present

## 2024-08-03 DIAGNOSIS — H26493 Other secondary cataract, bilateral: Secondary | ICD-10-CM | POA: Diagnosis not present

## 2024-08-03 DIAGNOSIS — H0011 Chalazion right upper eyelid: Secondary | ICD-10-CM | POA: Diagnosis not present

## 2024-08-03 DIAGNOSIS — Z961 Presence of intraocular lens: Secondary | ICD-10-CM | POA: Diagnosis not present

## 2024-08-05 DIAGNOSIS — Z853 Personal history of malignant neoplasm of breast: Secondary | ICD-10-CM | POA: Diagnosis not present

## 2024-08-05 DIAGNOSIS — Z951 Presence of aortocoronary bypass graft: Secondary | ICD-10-CM | POA: Diagnosis not present

## 2024-08-05 DIAGNOSIS — I482 Chronic atrial fibrillation, unspecified: Secondary | ICD-10-CM | POA: Diagnosis not present

## 2024-08-05 DIAGNOSIS — F330 Major depressive disorder, recurrent, mild: Secondary | ICD-10-CM | POA: Diagnosis not present

## 2024-08-08 DIAGNOSIS — Z951 Presence of aortocoronary bypass graft: Secondary | ICD-10-CM | POA: Diagnosis not present

## 2024-08-08 DIAGNOSIS — I480 Paroxysmal atrial fibrillation: Secondary | ICD-10-CM | POA: Diagnosis not present

## 2024-08-08 DIAGNOSIS — I25119 Atherosclerotic heart disease of native coronary artery with unspecified angina pectoris: Secondary | ICD-10-CM | POA: Diagnosis not present

## 2024-08-10 DIAGNOSIS — H26493 Other secondary cataract, bilateral: Secondary | ICD-10-CM | POA: Diagnosis not present

## 2024-08-10 DIAGNOSIS — H0012 Chalazion right lower eyelid: Secondary | ICD-10-CM | POA: Diagnosis not present

## 2024-08-10 DIAGNOSIS — Z961 Presence of intraocular lens: Secondary | ICD-10-CM | POA: Diagnosis not present

## 2024-08-11 DIAGNOSIS — F32A Depression, unspecified: Secondary | ICD-10-CM | POA: Diagnosis not present

## 2024-08-11 DIAGNOSIS — I2510 Atherosclerotic heart disease of native coronary artery without angina pectoris: Secondary | ICD-10-CM | POA: Diagnosis not present

## 2024-08-11 DIAGNOSIS — I509 Heart failure, unspecified: Secondary | ICD-10-CM | POA: Diagnosis not present

## 2024-08-11 DIAGNOSIS — E559 Vitamin D deficiency, unspecified: Secondary | ICD-10-CM | POA: Diagnosis not present

## 2024-08-11 DIAGNOSIS — G629 Polyneuropathy, unspecified: Secondary | ICD-10-CM | POA: Diagnosis not present

## 2024-08-11 DIAGNOSIS — G309 Alzheimer's disease, unspecified: Secondary | ICD-10-CM | POA: Diagnosis not present

## 2024-08-11 DIAGNOSIS — I4891 Unspecified atrial fibrillation: Secondary | ICD-10-CM | POA: Diagnosis not present

## 2024-08-11 DIAGNOSIS — I10 Essential (primary) hypertension: Secondary | ICD-10-CM | POA: Diagnosis not present

## 2024-08-11 DIAGNOSIS — E785 Hyperlipidemia, unspecified: Secondary | ICD-10-CM | POA: Diagnosis not present

## 2024-08-11 DIAGNOSIS — M069 Rheumatoid arthritis, unspecified: Secondary | ICD-10-CM | POA: Diagnosis not present

## 2024-08-19 DIAGNOSIS — I480 Paroxysmal atrial fibrillation: Secondary | ICD-10-CM | POA: Diagnosis not present

## 2024-08-31 DIAGNOSIS — H0288B Meibomian gland dysfunction left eye, upper and lower eyelids: Secondary | ICD-10-CM | POA: Diagnosis not present

## 2024-08-31 DIAGNOSIS — H0015 Chalazion left lower eyelid: Secondary | ICD-10-CM | POA: Diagnosis not present

## 2024-08-31 DIAGNOSIS — H0288A Meibomian gland dysfunction right eye, upper and lower eyelids: Secondary | ICD-10-CM | POA: Diagnosis not present

## 2024-10-17 DIAGNOSIS — L89319 Pressure ulcer of right buttock, unspecified stage: Secondary | ICD-10-CM | POA: Diagnosis not present

## 2024-10-31 DIAGNOSIS — L89152 Pressure ulcer of sacral region, stage 2: Secondary | ICD-10-CM | POA: Diagnosis not present

## 2024-10-31 DIAGNOSIS — Z951 Presence of aortocoronary bypass graft: Secondary | ICD-10-CM | POA: Diagnosis not present
